# Patient Record
Sex: Female | Born: 1935 | Race: White | Hispanic: No | Marital: Married | Smoking: Never smoker
Health system: Southern US, Community
[De-identification: ages and names within clinical notes are randomized; demographics above are authoritative.]

## PROBLEM LIST (undated history)

## (undated) DIAGNOSIS — H3530 Unspecified macular degeneration: Secondary | ICD-10-CM

## (undated) DIAGNOSIS — Z9289 Personal history of other medical treatment: Secondary | ICD-10-CM

## (undated) DIAGNOSIS — R011 Cardiac murmur, unspecified: Secondary | ICD-10-CM

## (undated) DIAGNOSIS — E785 Hyperlipidemia, unspecified: Secondary | ICD-10-CM

## (undated) DIAGNOSIS — E119 Type 2 diabetes mellitus without complications: Secondary | ICD-10-CM

## (undated) DIAGNOSIS — I499 Cardiac arrhythmia, unspecified: Secondary | ICD-10-CM

## (undated) DIAGNOSIS — M1990 Unspecified osteoarthritis, unspecified site: Secondary | ICD-10-CM

## (undated) DIAGNOSIS — I209 Angina pectoris, unspecified: Secondary | ICD-10-CM

## (undated) DIAGNOSIS — K922 Gastrointestinal hemorrhage, unspecified: Secondary | ICD-10-CM

## (undated) DIAGNOSIS — I739 Peripheral vascular disease, unspecified: Secondary | ICD-10-CM

## (undated) DIAGNOSIS — I779 Disorder of arteries and arterioles, unspecified: Secondary | ICD-10-CM

## (undated) DIAGNOSIS — D649 Anemia, unspecified: Secondary | ICD-10-CM

## (undated) DIAGNOSIS — K5520 Angiodysplasia of colon without hemorrhage: Secondary | ICD-10-CM

## (undated) DIAGNOSIS — I252 Old myocardial infarction: Secondary | ICD-10-CM

## (undated) DIAGNOSIS — I2510 Atherosclerotic heart disease of native coronary artery without angina pectoris: Secondary | ICD-10-CM

## (undated) DIAGNOSIS — N183 Chronic kidney disease, stage 3 unspecified: Secondary | ICD-10-CM

## (undated) DIAGNOSIS — I509 Heart failure, unspecified: Secondary | ICD-10-CM

## (undated) DIAGNOSIS — Z8739 Personal history of other diseases of the musculoskeletal system and connective tissue: Secondary | ICD-10-CM

## (undated) DIAGNOSIS — I10 Essential (primary) hypertension: Secondary | ICD-10-CM

## (undated) HISTORY — PX: CORONARY ANGIOPLASTY WITH STENT PLACEMENT: SHX49

## (undated) HISTORY — DX: Atherosclerotic heart disease of native coronary artery without angina pectoris: I25.10

## (undated) HISTORY — DX: Unspecified macular degeneration: H35.30

## (undated) HISTORY — DX: Angiodysplasia of colon without hemorrhage: K55.20

## (undated) HISTORY — PX: CARDIAC CATHETERIZATION: SHX172

## (undated) HISTORY — DX: Disorder of arteries and arterioles, unspecified: I77.9

## (undated) HISTORY — DX: Peripheral vascular disease, unspecified: I73.9

## (undated) HISTORY — DX: Unspecified osteoarthritis, unspecified site: M19.90

---
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
## 1959-05-24 HISTORY — PX: DILATION AND CURETTAGE OF UTERUS: SHX78

## 1988-09-22 HISTORY — PX: CORONARY ARTERY BYPASS GRAFT: SHX141

## 1991-09-23 HISTORY — PX: AORTIC VALVE REPLACEMENT: SHX41

## 1992-09-22 HISTORY — PX: CARDIAC VALVE REPLACEMENT: SHX585

## 2001-06-17 ENCOUNTER — Encounter: Payer: Self-pay | Admitting: Family Medicine

## 2001-06-17 ENCOUNTER — Encounter: Admission: RE | Admit: 2001-06-17 | Discharge: 2001-06-17 | Payer: Self-pay | Admitting: Family Medicine

## 2001-07-23 ENCOUNTER — Encounter: Admission: RE | Admit: 2001-07-23 | Discharge: 2001-07-23 | Payer: Self-pay | Admitting: Family Medicine

## 2001-07-23 ENCOUNTER — Encounter: Payer: Self-pay | Admitting: Family Medicine

## 2001-09-29 ENCOUNTER — Other Ambulatory Visit: Admission: RE | Admit: 2001-09-29 | Discharge: 2001-09-29 | Payer: Self-pay | Admitting: Family Medicine

## 2001-10-01 ENCOUNTER — Encounter: Admission: RE | Admit: 2001-10-01 | Discharge: 2001-10-01 | Payer: Self-pay | Admitting: Family Medicine

## 2001-10-01 ENCOUNTER — Encounter: Payer: Self-pay | Admitting: Family Medicine

## 2002-10-05 ENCOUNTER — Encounter: Payer: Self-pay | Admitting: Family Medicine

## 2002-10-05 ENCOUNTER — Encounter: Admission: RE | Admit: 2002-10-05 | Discharge: 2002-10-05 | Payer: Self-pay | Admitting: Family Medicine

## 2003-01-15 ENCOUNTER — Encounter: Payer: Self-pay | Admitting: Family Medicine

## 2003-01-15 ENCOUNTER — Encounter: Admission: RE | Admit: 2003-01-15 | Discharge: 2003-01-15 | Payer: Self-pay | Admitting: Family Medicine

## 2003-02-15 ENCOUNTER — Encounter: Admission: RE | Admit: 2003-02-15 | Discharge: 2003-02-24 | Payer: Self-pay | Admitting: Orthopedic Surgery

## 2003-10-29 ENCOUNTER — Inpatient Hospital Stay (HOSPITAL_COMMUNITY): Admission: EM | Admit: 2003-10-29 | Discharge: 2003-11-04 | Payer: Self-pay | Admitting: Emergency Medicine

## 2003-12-01 ENCOUNTER — Encounter: Admission: RE | Admit: 2003-12-01 | Discharge: 2003-12-01 | Payer: Self-pay | Admitting: Family Medicine

## 2003-12-10 ENCOUNTER — Inpatient Hospital Stay (HOSPITAL_COMMUNITY): Admission: EM | Admit: 2003-12-10 | Discharge: 2003-12-13 | Payer: Self-pay | Admitting: Emergency Medicine

## 2004-02-23 ENCOUNTER — Encounter (HOSPITAL_COMMUNITY): Admission: RE | Admit: 2004-02-23 | Discharge: 2004-05-23 | Payer: Self-pay | Admitting: Family Medicine

## 2004-09-27 ENCOUNTER — Other Ambulatory Visit: Admission: RE | Admit: 2004-09-27 | Discharge: 2004-09-27 | Payer: Self-pay | Admitting: Family Medicine

## 2004-10-16 ENCOUNTER — Ambulatory Visit: Payer: Self-pay | Admitting: Hematology & Oncology

## 2004-12-16 ENCOUNTER — Encounter: Admission: RE | Admit: 2004-12-16 | Discharge: 2004-12-16 | Payer: Self-pay | Admitting: Family Medicine

## 2005-02-11 ENCOUNTER — Ambulatory Visit: Payer: Self-pay | Admitting: Hematology & Oncology

## 2005-08-19 ENCOUNTER — Ambulatory Visit: Payer: Self-pay | Admitting: Internal Medicine

## 2005-08-22 ENCOUNTER — Ambulatory Visit: Payer: Self-pay | Admitting: Internal Medicine

## 2005-08-22 ENCOUNTER — Ambulatory Visit (HOSPITAL_COMMUNITY): Admission: RE | Admit: 2005-08-22 | Discharge: 2005-08-22 | Payer: Self-pay | Admitting: Internal Medicine

## 2005-11-11 ENCOUNTER — Ambulatory Visit: Payer: Self-pay | Admitting: Internal Medicine

## 2005-12-17 ENCOUNTER — Encounter: Admission: RE | Admit: 2005-12-17 | Discharge: 2005-12-17 | Payer: Self-pay | Admitting: Family Medicine

## 2006-12-28 ENCOUNTER — Encounter: Admission: RE | Admit: 2006-12-28 | Discharge: 2006-12-28 | Payer: Self-pay | Admitting: Family Medicine

## 2007-11-29 ENCOUNTER — Other Ambulatory Visit: Admission: RE | Admit: 2007-11-29 | Discharge: 2007-11-29 | Payer: Self-pay | Admitting: Family Medicine

## 2008-01-03 ENCOUNTER — Encounter: Admission: RE | Admit: 2008-01-03 | Discharge: 2008-01-03 | Payer: Self-pay | Admitting: Family Medicine

## 2008-02-21 ENCOUNTER — Inpatient Hospital Stay (HOSPITAL_COMMUNITY): Admission: RE | Admit: 2008-02-21 | Discharge: 2008-02-22 | Payer: Self-pay | Admitting: Interventional Cardiology

## 2008-02-24 ENCOUNTER — Inpatient Hospital Stay (HOSPITAL_COMMUNITY): Admission: EM | Admit: 2008-02-24 | Discharge: 2008-03-06 | Payer: Self-pay | Admitting: Emergency Medicine

## 2008-02-24 ENCOUNTER — Ambulatory Visit: Payer: Self-pay | Admitting: Vascular Surgery

## 2008-02-24 HISTORY — PX: EXPLORATORY LAPAROTOMY: SUR591

## 2008-02-28 ENCOUNTER — Encounter (INDEPENDENT_AMBULATORY_CARE_PROVIDER_SITE_OTHER): Payer: Self-pay | Admitting: Cardiology

## 2008-03-21 ENCOUNTER — Ambulatory Visit: Payer: Self-pay | Admitting: Vascular Surgery

## 2008-03-23 ENCOUNTER — Encounter (HOSPITAL_COMMUNITY): Admission: RE | Admit: 2008-03-23 | Discharge: 2008-06-15 | Payer: Self-pay | Admitting: Interventional Cardiology

## 2008-04-20 ENCOUNTER — Ambulatory Visit: Payer: Self-pay | Admitting: Cardiology

## 2008-04-21 ENCOUNTER — Inpatient Hospital Stay (HOSPITAL_COMMUNITY): Admission: EM | Admit: 2008-04-21 | Discharge: 2008-04-30 | Payer: Self-pay | Admitting: Emergency Medicine

## 2008-04-26 ENCOUNTER — Ambulatory Visit: Payer: Self-pay | Admitting: Internal Medicine

## 2008-05-08 ENCOUNTER — Encounter: Payer: Self-pay | Admitting: Internal Medicine

## 2008-08-22 ENCOUNTER — Ambulatory Visit: Payer: Self-pay | Admitting: Vascular Surgery

## 2009-01-03 ENCOUNTER — Encounter: Admission: RE | Admit: 2009-01-03 | Discharge: 2009-01-03 | Payer: Self-pay | Admitting: Family Medicine

## 2009-12-11 ENCOUNTER — Inpatient Hospital Stay (HOSPITAL_COMMUNITY): Admission: RE | Admit: 2009-12-11 | Discharge: 2009-12-12 | Payer: Self-pay | Admitting: Interventional Cardiology

## 2010-01-10 ENCOUNTER — Encounter: Admission: RE | Admit: 2010-01-10 | Discharge: 2010-01-10 | Payer: Self-pay | Admitting: Family Medicine

## 2010-12-15 LAB — GLUCOSE, CAPILLARY
Glucose-Capillary: 117 mg/dL — ABNORMAL HIGH (ref 70–99)
Glucose-Capillary: 128 mg/dL — ABNORMAL HIGH (ref 70–99)
Glucose-Capillary: 161 mg/dL — ABNORMAL HIGH (ref 70–99)

## 2010-12-15 LAB — CBC
HCT: 34 % — ABNORMAL LOW (ref 36.0–46.0)
Platelets: 210 10*3/uL (ref 150–400)
RDW: 13.5 % (ref 11.5–15.5)

## 2010-12-15 LAB — BASIC METABOLIC PANEL
BUN: 12 mg/dL (ref 6–23)
GFR calc non Af Amer: 52 mL/min — ABNORMAL LOW (ref >60)
Glucose, Bld: 135 mg/dL — ABNORMAL HIGH (ref 70–99)
Potassium: 4.7 mEq/L (ref 3.5–5.1)

## 2010-12-15 LAB — PROTIME-INR: INR: 1.52 — ABNORMAL HIGH (ref 0.00–1.49)

## 2010-12-25 ENCOUNTER — Other Ambulatory Visit: Payer: Self-pay | Admitting: Family Medicine

## 2010-12-25 DIAGNOSIS — Z1231 Encounter for screening mammogram for malignant neoplasm of breast: Secondary | ICD-10-CM

## 2011-01-13 ENCOUNTER — Ambulatory Visit
Admission: RE | Admit: 2011-01-13 | Discharge: 2011-01-13 | Disposition: A | Discharge status: Home / Self Care | Payer: Medicare Other | Source: Ambulatory Visit | Attending: Family Medicine | Admitting: Family Medicine

## 2011-01-13 DIAGNOSIS — Z1231 Encounter for screening mammogram for malignant neoplasm of breast: Secondary | ICD-10-CM

## 2011-02-04 NOTE — Discharge Summary (Signed)
[name], [name]                ACCOUNT NO.:  [id number]   MEDICAL RECORD NO.:  [id number]          PATIENT TYPE:  INP   LOCATION:  2011                         FACILITY:  [facility]   PHYSICIAN:  Judeth Cornfield. Scot Dock, M.D.DATE OF BIRTH:  September 30, 1932   DATE OF ADMISSION:  02/24/2008  DATE OF DISCHARGE:  03/06/2008                               DISCHARGE SUMMARY   DISCHARGE DIAGNOSES:  1. Retroperitoneal bleed.  2. Bleeding from external iliac artery.  3. Coronary artery disease status post cardiac catheterization, Monday      prior to retroperitoneal hematoma.  4. History of gastrointestinal bleed.  5. Acute blood loss anemia.  6. Diabetes mellitus.  7. Status post aortic valve replacement using St. Jude valve.  8. History of congestive heart failure.   PROCEDURES PERFORMED:  1. Exploratory laparotomy.  2. Exploration of right groin with identification and control of right      iliac hemorrhage by Dr. Scot Dock in February 24, 2008.   COMPLICATIONS:  None.   DISCHARGE MEDICATIONS:  She is instructed to resume her home medicines  consisting of  1. Digoxin 0.125 mg p.o. daily.  2. Lasix 80 mg p.o. daily.  3. Vytorin  10/80 mg p.o. daily.  4. Aspirin 81 mg p.o. daily.  5. Diovan 80 mg p.o. daily.  6. Folic acid XX123456 mcg p.o. b.i.d.  7. Tandem Plus p.o. b.i.d.  8. Coumadin 5 mg p.o. daily.  9. Metoprolol tartrate 50 mg p.o. b.i.d.  10.Metformin, hydrochlorothiazide 500 mg p.o. b.i.d.  11.Klor-Con 20 mEq p.o. b.i.d.  12.Isosorbide mononitrate 60 mg p.o. daily.  13.Sublingual nitroglycerin 0.4 mg spray as needed.  14.Plavix 75 mg p.o. daily   DISPOSITION:  She is being discharged home in stable condition with her  wounds healing well.  She is instructed to clean the wound with soap and  warm water.  She may increase her activity slowly.  She may walk up  steps.  She may shower.  She should not lift for 6 more weeks.  He  should not drive for 3 weeks.  She is to see Dr. Scot Dock in 2  weeks in  the office will call with a visit.  She is to follow up with Dr.  Hassell Done office for Coumadin adjustment on Wednesday.  She is to see  Dr. Irish Lack at his scheduled appointments.   BRIEF IDENTIFYING STATEMENT:  For complete details, please refer the  typed history and physical.  Briefly, this very pleasant 77 year old  woman presented to the emergency room with an acute abdomen.  She had a  recent cardiac catheterization, it was suspected that she had a  retroperitoneal bleed.  She was hemodynamically unstable.  She was  evaluated by Dr. Scot Dock and recommended exploratory laparotomy with  control of any bleeding sites.  She was taken to the operating room on  an emergent basis.   HOSPITAL COURSE:  She was taken to the operating room on an urgent  basis.  She underwent the aforementioned exploratory laparotomy with  release of her retroperitoneal hematoma and exploration of her right  groin with identification and  control of a bleeding site on her right  iliac artery.  She tolerated the procedure and was returned to the  Intensive Care Unit in critical but hemodynamically stable and guarded  condition.  She required several days in the Intensive Care Unit.  She  required multiple blood products for placement.  She was eventually able  to be transferred to a bed on a surgical step-down unit.   Her anticoagulation with Coumadin was started when appropriate.  Postoperatively, she did have some liver dysfunction as was suspected.  She did have an elevated bilirubin to a peak of 11.3.  This was  declining.  Her liver functions were returning to normal limits.   Postoperatively, she was walking and improving with physical therapy.  Her wounds were healing well.  She was desirous of discharge home on  March 06, 2008.  She was felt stable and then subsequently discharged  home.      Chad Cordial, PA      Judeth Cornfield. Scot Dock, M.D.  Electronically Signed     KEL/MEDQ  D:  03/06/2008  T:  03/06/2008  Job:  [id number]   cc:   Mauri Brooklyn

## 2011-02-04 NOTE — H&P (Signed)
[name], [name]                ACCOUNT NO.:  [id number]   MEDICAL RECORD NO.:  [id number]          PATIENT TYPE:  INP   LOCATION:  2550                         FACILITY:  [facility]   PHYSICIAN:  Judeth Cornfield. Scot Dock, M.D.DATE OF BIRTH:  01-Dec-1932   DATE OF ADMISSION:  02/24/2008  DATE OF DISCHARGE:                              HISTORY & PHYSICAL   REASON FOR ADMISSION:  Acute abdominal pain with hypotension.   HISTORY:  This is a 77 year old woman who had been brought to the  emergency room by paramedics.  She had been complaining of severe  abdominal pain at home.  Reportedly, initially a blood pressure could  not be obtained.  Her initial blood pressure in the emergency  department, the best I can tell, was 80 systolic.  She later improved to  127/36 but then subsequent pressure dropped to 65.  She has not in the  emergency department for very long and was evaluated by the room  emergency department physician.  It was felt that she potentially have  ruptured aneurysm or other acute abdominal process and vascular surgery  was consulted.  I have very briefly spoken to the patient before taking  emergently to the operating room.  She tells me that she is 77 and had a  heart cath Monday.  She had been having some mild abdominal pain and  today her pain became acutely worse.  She subsequently became  hypotensive and developed a markedly distended abdomen.  Because of the  emergency of the situation, a full history could not be obtained.  According to her records from 2005, she has a history of diabetes, in  addition to the hypertension, and status post aortic valve replacement  with a St. Jude valve.  She also has a history of recurrent GI bleed in  the past.  She is maintained on Coumadin because her aortic valve  replacement.  She has also had previous coronary bypass grafting surgery  in 1990.  She also has a history of congestive heart failure.   FAMILY HISTORY:  She has a  history of coronary disease in the family.  No history of malignancy in the family.   ALLERGIES:  PENICILLIN.   PAST SURGICAL HISTORY:  Four-vessel CABG and aortic valve replacement.   MEDICATIONS:  Based on the discharge sheet which she had were:  1. Lovenox 60 mg subcu twice daily.  2. Digoxin 0.125 mg p.o. daily.  3. Lasix 80 mg p.o. daily.  4. Vytorin 10/80 mg p.o. daily.  5. Aspirin 81 mg p.o. daily.  6. Diovan 80 mg p.o. daily.  7. Folic acid A999333 mg p.o. b.i.d.  8. Coumadin 2.5 mg Sundays and Thursdays and 5 mg Mondays, Tuesdays,      Wednesdays, Fridays and Saturdays.  9. Metoprolol 50 mg p.o. b.i.d.  10.Metformin 500 mg 2 p.o. b.i.d.  11.K-Lor 20 mEq p.o. b.i.d.  12.Isosorbide 60 mg p.o. daily.   REVIEW OF SYSTEMS:  Could not be obtained.   SOCIAL HISTORY:  Was not obtained.   PHYSICAL EXAMINATION:  The blood pressure when I last saw  her in the  emergency department was 65 systolic before she was taken urgently to  the operating room.   IMPRESSION:  This patient presents with sudden onset abdominal pain with  a distended abdomen and hypotension.  This is most likely related to her  recent heart cath and she did have a large retroperitoneal hematoma with  potentially active bleeding.  She is hemodynamically unstable and needs  to be taken urgently to the operating room.  Family is not yet available  to discuss this and we will proceed urgently.  I did briefly discuss  this with the patient.  She did undergo a heart cath and had PTCA of 2  stenoses.  She is on Plavix and also Coumadin and Lovenox.  Her labs  were pending.  We will proceed urgently.      Judeth Cornfield. Scot Dock, M.D.  Electronically Signed     CSD/MEDQ  D:  02/24/2008  T:  02/24/2008  Job:  [id number]

## 2011-02-04 NOTE — H&P (Signed)
[name], [name]                ACCOUNT NO.:  [id number]   MEDICAL RECORD NO.:  [id number]          PATIENT TYPE:  INP   LOCATION:  [id number]                         FACILITY:  [facility]   PHYSICIAN:  Sheila Oats, M.D.DATE OF BIRTH:  [birth year]   DATE OF ADMISSION:  04/20/2008  DATE OF DISCHARGE:                              HISTORY & PHYSICAL   PRIMARY CARE PHYSICIAN:  Modena Jansky. Marisue Humble, M.D.   CARDIOLOGIST:  Jettie Booze, M.D.   GASTROENTEROLOGIST:  Delfin Edis, M.D.   CHIEF COMPLAINT:  Black stools.   HISTORY OF THE PRESENT ILLNESS:  The patient is a 77 year old white  female with a past medical history significant for recent  retroperitoneal bleed, bleeding from the external iliac following  cardiac catheterization in June with stent placement, coronary artery  disease status post CABG about 17 years ago, prior history of GI bleed  with acute blood loss anemia, status post aortic valve replacement using  St. Jude's valve and is on chronic Coumadin, diabetes mellitus,  hypertension, history of CHF, and history of MI who presents with the  above complaint.  She states that she has been feeling dizzy for the  past 3-4 days; and,  2 days ago she went to see her primary care  physician and hemoglobin was ordered.  Her dizziness persisted and also  she became weaker; and, today she had a melanotic stool and so she  called her primary care physician's office.  At that time she was told  that her hemoglobin came back at 9.0 and she was asked to come to the  ER.  It is noted that her last hemoglobin during her June  hospitalization was 11.  She also admits to left-sided chest discomfort  described as a pressure, 2-3/10 in intensity, which is worse with  exertion, and she has also had dyspnea on exertion.  She reports that  she has been nauseated since her surgery in June for the retroperitoneal  bleed and that has not changed.  She denies cough, fevers, diarrhea and  vomiting.   She denies dysuria but, states that she was recently  diagnosed with a UTI, and treated with Bactrim by her primary care  physician.   The patient was seen in the ER and lab work revealed a hemoglobin of 7.7  with an INR of 6.0 and a troponin of 0.17 with a CK/MB of 1.2, and  myoglobin of 62.5.  [facility] was consulted per the ED  physician and he states that the abnormal troponin is  most likely  secondary to demand ischemia due to the severe anemia.  He also  recommended that the INR be reversed and he will see the patient.  Gastroenterology was also consulted in (Dr. Fuller Plan) and he agreed with  transfusing the patient, reversing the Coumadin and he will see the  patient, but will not perform an endoscopy tonight.  She is admitted for  further evaluation and management.   PAST MEDICAL HISTORY:  The past medical history is as noted above.   MEDICATIONS:  1. Aspirin 81 mg daily.  2.  Coumadin 5 mg.  3. Digoxin 0.125 mg.  4. Diovan 80 mg daily.  5. Folic acid A999333 mcg daily.  6. Isosorbide dinitrate 60 mg daily.  7. Lasix 80 mg daily.  8. Metformin 500 mg twice a day.  9. Metoprolol 50 mg twice a day.  10.Kay-Ciel 20 mEq.  11.Vytorin 10/80 mg daily.  12.Bactrim.   ALLERGIES:  PENICILLIN.   SOCIAL HISTORY:  The patient denies tobacco.  She also denies alcohol.   FAMILY HISTORY:  The family history is reviewed and is noncontributory  to current illness.   PHYSICAL EXAMINATION:  GENERAL APPEARANCE:  In general the patient is an  elderly white female.  She is alert and in no respiratory distress.  VITAL SIGNS:  Temperature is 98.2 with a blood pressure of 117/48;  however, initially it was 109/61.  Pulse is 86, respiratory rate is  16  and O2 sat is 99%.  HEENT:  PERRL.  EOMI.  Slightly dry mucous membranes and no oral  exudates.  NECK:  The neck is supple.  No adenopathy, no thyromegaly and no JVD.  HEART:  Cardiovascular exam shows regular rate and rhythm.  Normal  S1  and S2.  She has a 2/6 systolic murmur and no S3 is appreciated.  LUNGS:  The lungs are clear to auscultation bilaterally.  No crackles or  wheezes.  ABDOMEN:  The abdomen is soft.  Bowel sounds are present.  There is mild  lower abdominal tenderness to palpation, no rebound, no organomegaly and  no masses are palpable.  EXTREMITIES:  The extremities reveal no cyanosis and no edema.  NEUROLOGIC:  On neuro exam she is alert and oriented x3.  Cranial nerves  II-XII are grossly intact.  Nonfocal exam.   LABORATORY DATA:  The laboratory data is as per the HPI, also occult  blood is positive.  Sodium is 131, potassium is 3.7, chloride is 98, BUN  is 25, creatinine is 1.5 with a glucose of 97.  Ionized calcium is 107.  White cell count is 8.4 with a hemoglobin of 7.7, hematocrit of 22.6,  platelet count of 297,000, and neutrophil count 57%.  INR is 6.0.  Urinalysis is unremarkable.  EKG showed normal sinus rhythm with a rate  of 90, and ST depressions in 1, 2 and the lateral leads.   ASSESSMENT AND PLAN:  1. Melena with gastrointestinal bleed as discussed above in a patient      with a supertherapeutic INR and with a St. Jude's valve as well as      aortic valve replacement.  Gastrointestinal was consulted.  We will      monitor the hemoglobin and hematocrit.  We will transfuse packed      red blood cells, and hold Coumadin and aspirin.  The patient was      given vitamin K, fresh frozen plasma and platelets in the emergency      room; and, as already indicated we will be monitoring the      hemoglobin and hematocrit, perform fluid resuscitation and keep the      patient nothing per os.  2. Severe anemia secondary to #1 as above.  3. Supertherapeutic INR.  As above she has received fresh frozen      plasma and vitamin K to reverse this; and, we will follow.  4. Abnormal cardiac enzymes with coronary artery disease.  As      discussed above the patient is status post coronary artery  bypass  graft in the past and a stent recently placed in June 2009.  She is      complaining of chest pain and electrocardiogram showed ST      depression in 1, 2 and the lateral leads.  As needed nitroglycerin.      No aspirin or anticoagulants at this time secondary to the      gastrointestinal bleed.  We will give her low dose beta blocker as      blood pressure tolerates.  Cardiology was consulted as already      discussed.  5. Status post aortic valve replacement.  As above we are reversing      Coumadin.  We will monitor and cardiology has been consulted.  6. History of congestive heart failure.  Monitor fluid status and      further management will be as appropriate.  7. Diabetes mellitus.  We will hold the metformin, monitor Accu-Cheks      and cover with sliding scale.  Please go back up to number one and      add as the last sentence there      Sheila Oats, M.D.  Electronically Signed     ACV/MEDQ  D:  04/21/2008  T:  04/21/2008  Job:  [id number]   cc:   Modena Jansky. Marisue Humble, M.D.  Jettie Booze, MD  Lowella Bandy. Olevia Perches, MD

## 2011-02-04 NOTE — Consult Note (Signed)
[name], [name]                ACCOUNT NO.:  [id number]   MEDICAL RECORD NO.:  [id number]           PATIENT TYPE:   LOCATION:                                 FACILITY:   PHYSICIAN:  Lear Ng, MDDATE OF BIRTH:  [birth year]   DATE OF CONSULTATION:  DATE OF DISCHARGE:                                 CONSULTATION   We were asked to see [name] today in consultation for elevated  bilirubin and jaundice by Dr. Grover Canavan.   HISTORY OF PRESENT ILLNESS:  This is a 77 year old female with history  of severe coronary artery disease who had a cardiac catheterization on  June 1.  She developed abdominal pain and hypotension, went to the ER on  June 3.  She had exploratory laparoscopy, which revealed a  retroperitoneal hematoma and bleeding from the right external iliac  artery.  Her INR on admission was 4.4.   Her lab test showed that her bilirubin was 1.0 on June 5, 9.0 on June 8.  Her Coumadin was restarted on June 5 and discontinued on either June 8  or 9.  Her bilirubin today is 11.3, Coumadin been held for the last  couple of days.  Per chart, she has received a total of 9 units packed  red blood cells and 4 units of FFP.  Mostly on June 4, she did receive 2  units of packed red blood yesterday.   The patient had GI bleeding problems in 2005.  She had a colonoscopy and  an endoscopy by Dr. Teena Irani, both were unrevealing for source of  bleeding.  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  She then went to a hematologist to rule out things like  leukemia.  The hematologist prescribed iron and she has had no bleeding  problems since.  She has no history of liver disease, elevated  bilirubin, and alcohol abuse, hepatitis, or hepatitis risk factors.  Prior to this admit the patient's INR has been very stable.   PAST MEDICAL HISTORY:  Significant for aortic valve replacement,  hyperlipidemia, diabetes, cardiac arrhythmias, coronary artery disease,  anemia, hematochezia in February 2005.  She had a St. Jude aortic valve  replacement in 1994, coronary artery bypass grafting in 1996.  She has  had laparoscopy and appendectomy.   CURRENT MEDICATIONS:  Include, Coumadin, Digoxin, 81 mg aspirin, Plavix  and the rest are per chart.   ALLERGY:  She has an allergy to PENICILLIN.   SOCIAL HISTORY:  Negative for drugs, alcohol, and tobacco.  She is  married, her husband is present in the room.   FAMILY HISTORY:  Significant for no liver disease.   PHYSICAL EXAM:  GENERAL:  She is alert and oriented in no apparent  distress.  She is 2+ jaundice.  She also has icterus.  HEART:  Has regular rate and rhythm.  Urine in the Foley catheter is very red.  LUNGS:  She is taking shallow breaths secondary to pain, but there are  no wheezes or crackles demonstrated.  ABDOMEN:  Her incision is clean  and dry with no signs of infection.  Her  abdomen is firm, distended.  She says it is less distended than  yesterday.  She has a few tympanic bowel sounds.  She is tender in left  lower quadrant.   CURRENT LABS:  Show hemoglobin of 10.4; that is after 2 units of packed  red blood cells yesterday when her hemoglobin was 8.4, hematocrit 29.6,  white count 8.4, platelets 296,000.  BMET is within normal limits.  Her  BUN has slowly climbed, it is 21, which is still within normal limits.  Creatinine 0.78, glucose 128.  Her INR on June 8 was 2.0.  Her  hemoglobin was 10.3.  Her INR on June 10 was 4.3.  Her hemoglobin was  8.4.  Today her INR is 3.8.   ASSESSMENT:  Dr. Wilford Corner seen and examined the patient,  collected a history and reviewed her chart.   IMPRESSION:  His impression is as follows; the patient seen and  examined.  Hospital events reviewed.  I think jaundice is related to  extravasation of blood and the tissues causing asymptomatic  hyperbilirubinemia.  Total bilirubin has a delayed affect, I think it  will plateau out soon.  No need  to order other hepatic studies unless  elevated bilirubin persists without sign of further bleeding and will  follow along with you.  Thank you very much for this consultation.      Melton Alar, [place]      Lear Ng, MD  Electronically Signed    MLY/MEDQ  D:  03/02/2008  T:  03/03/2008  Job:  [id number]   cc:   Lear Ng, MD  Jettie Booze, MD

## 2011-02-04 NOTE — Assessment & Plan Note (Signed)
OFFICE VISIT   [name], [name]  DOB:  1933/07/26                                       03/21/2008  CHART#:[id number]   I saw the patient in the office today for continued followup after  repair of the right external iliac artery.  This 77 year old woman who  developed acute abdominal pain and presented to the emergency department  with hypotension and abdominal distention.  She was taken urgently to  the operating room and  underwent exploratory laparotomy for control of  bleeding and repair of the right external iliac artery.  She comes in  for a routine followup visit.  Her only complaint has been that her  appetite has not to fully returned.  She has been gradually resuming her  normal activities.   REVIEW OF SYSTEMS:  She has had no recent chest pain, chest pressure, or  significant shortness of breath.   PHYSICAL EXAMINATION:  Blood pressure is 115/68, heart rate is 89.  Her  abdominal incision has healed nicely, as has her groin incision.  She  has normal-pitched bowel sounds.  Abdomen is soft and nontender.  She  has warm, well-perfused feet with biphasic Doppler signals in both feet.  ABI of 100% bilaterally.   Overall I am pleased with her progress.  I have encouraged her to  gradually resume her normal activities.  I plan on seeing her back in 6  months.  She knows to call sooner if she has problems.   Judeth Cornfield. Scot Dock, M.D.  Electronically Signed   CSD/MEDQ  D:  03/21/2008  T:  03/22/2008  Job:  [id number]

## 2011-02-04 NOTE — Assessment & Plan Note (Signed)
OFFICE VISIT   SAVANNAHA, GRAM  DOB:  [birth year]                                       08/22/2008  CHART#:[id number]   I saw the patient in the office today for followup after she underwent  exploratory laparotomy for retroperitoneal hematoma back in June of this  year.  When I saw her last, she had some problems with her appetite and  comes in for routine followup visit.  Her appetite has returned to  normal.  She had no significant abdominal or back pain.  She had no  fever or chills.  She had no claudication or rest pain.   REVIEW OF SYSTEMS:  She had no chest pain, chest pressure, palpitations  or arrhythmias.  She had no bronchitis, asthma or wheezing.   PHYSICAL EXAMINATION:  This is a pleasant 77 year old woman who appears  her stated age.  Blood pressure 104/58, the heart rate is 71,  temperature 97.8.  Lungs are clear bilaterally to auscultation.  Cardiac  exam, she has a regular rate and rhythm.  Abdomen:  Soft, nontender.  Incision has healed nicely.  She has no evidence of ventral hernia.  She  has warm well-perfused feet and palpable femoral pulses.  Right groin  incision has healed nicely.   Overall I am pleased with her progress and I will see her back p.r.n.   Judeth Cornfield. Scot Dock, M.D.  Electronically Signed   CSD/MEDQ  D:  08/22/2008  T:  08/23/2008  Job:  [id number]

## 2011-02-04 NOTE — Op Note (Signed)
[name], [name] NO.:  [id number]   MEDICAL RECORD NO.:  [id number]          PATIENT TYPE:  INP   LOCATION:  2303                         FACILITY:  [facility]   PHYSICIAN:  Judeth Cornfield. Scot Dock, M.D.DATE OF BIRTH:  1933/01/18   DATE OF PROCEDURE:  02/24/2008  DATE OF DISCHARGE:                               OPERATIVE REPORT   PREOPERATIVE DIAGNOSIS:  Acute abdominal pain with hypotension.   POSTOPERATIVE DIAGNOSES:  Retroperitoneal hematoma and bleeding from  right external iliac artery.   SURGEON:  Judeth Cornfield. Scot Dock, MD   ANESTHESIA:  General.   INDICATIONS:  This is a 66-year woman who developed acute abdominal pain  and presented to the emergency department with hypotension and abdominal  distention.  She was taken urgently to the operating room for  exploratory laparotomy, control of bleeding, presumably from her  previous cath.   TECHNIQUE:  The patient was taken to the operating room urgently and  Swan-Ganz catheter and arterial line were placed by Anesthesia.  The  abdomen and groins were prepped and draped in usual sterile fashion.  The abdomen was entered through a midline incision, and there was a  large pelvic retroperitoneal hematoma noted.  I felt the patient did not  have evidence of an abdominal aortic aneurysm or leaking.  I did expose  the infrarenal aorta for proximal control.  Next, I made an oblique  incision in the right groin where the superficial femoral, deep femoral,  and common femoral arteries were exposed, and I dissected up higher on  the external iliac artery where there was active bleeding from the  external iliac artery.  This was controlled with digital pressure, so I  could dissect above and below the injury.  The artery was briefly  clamped above and below, and dissected out the injury, and this was  repaired with two 5-0 Prolene sutures.  Hemostasis was obtained here and  the clamps were released, and attention  turned to the abdomen again.  Now, I had proximal and distal control.  I entered the large pelvic  hematoma, and a large amount of clot was evacuated.  There was no active  bleeding currently.  The abdomen was irrigated with copious amounts of  saline.  I did an exploratory laparotomy, did not see any evidence of  aneurysm or significant GI pathology.  The abdomen was irrigated,  hemostasis obtained.  The abdominal contents were then returned to their  normal position, and the fascial layer was closed with two #1 PDS  sutures.  The subcutaneous layer was closed with 3-0 Vicryl.  The skin  was closed with staples.  The groin incision was closed after hemostasis  was obtained using a deep layer of 2-0 Vicryl, subcutaneous layer of 2-0  Vicryl, and the skin was closed with a 4-0 subcuticular stitch.  Sterile  dressing was applied.  The patient tolerated the procedure well, was  transferred to the recovery room in satisfactory condition.  All needle  and sponge counts were correct.      Judeth Cornfield. Scot Dock, M.D.  Electronically Signed  CSD/MEDQ  D:  02/24/2008  T:  02/25/2008  Job:  [id number]

## 2011-02-04 NOTE — Discharge Summary (Signed)
[name], [name]                ACCOUNT NO.:  [id number]   MEDICAL RECORD NO.:  [id number]          PATIENT TYPE:  INP   LOCATION:  2002                         FACILITY:  [facility]   PHYSICIAN:  Sheila Oats, M.D.DATE OF BIRTH:  10/20/1932   DATE OF ADMISSION:  04/20/2008  DATE OF DISCHARGE:  04/30/2008                               DISCHARGE SUMMARY   DISCHARGE DIAGNOSES:  1. Gastrointestinal bleeding - in patient with supra-therapeutic INR,      likely secondary to small bowel arteriovenous malformations of the      gastroenterology.  2. Acute blood loss anemia - secondary to number 1, hemoglobin 12.2      today prior to discharge (improved from 7.7 on admission).  3. Abnormal cardiac enzymes/EKG abnormality - secondary to demand      ischemia due to severe anemia/gastrointestinal bleed.  4. Supra-therapeutic anemia.  5. Coronary artery disease status post recent PCI with bare metal      stent 2 months ago.  6. History of post cardiac catheterization retroperitoneal bleed and      status post laparotomy with suturing of external iliac.  7. Status post aortic valve replacement/St. Jude's valve, on chronic      Coumadin therapy - Coumadin resumed an INR today 2, goal range      recommended per cardiology 2 to 3.  8. Diabetes mellitus.  9. Hypertension.  10.hyperlipidemia.  11.Elevated LFTs - improved.  12.HIDA scan negative for acute cholecystitis or cystic duct      obstruction.  13.Acute renal insufficiency - resolved.  Last creatinine prior to      discharge 1.   PROCEDURES AND STUDIES:  1. Abdominal ultrasound - gall stones, there is a stone which appears      to be lodged in the neck of the gallbladder, negative for acute      cholecystitis, no biliary ductal dilatation.  2. HIDA scan - no evidence of acute cholecystitis was cystic duct      obstruction.   CONSULTATION:  1. Cardiology - Dr. Irish Lack.  2. [facility], Dr. Olevia Perches.   BRIEF HISTORY:   The patient is a pleasant 77 year old white female with  the above-listed medical problems who presented with melanotic stools.  She reported that she had been feeling dizzy for a few days prior to  admission and had seen her primary care physician and a hemoglobin was  ordered.  It came back at 9.  The patient's dizziness was persisting and  also she was becoming weaker, and so she came to the ER.  In the ER, her  hemoglobin was found to be 7.7 and her INR 6.  She was complaining of  chest pain as well and her EKG revealed inferolateral ST depression and  sinus tachycardia, her cardiac enzymes were also elevated.  Cardiology  and gastroenterology were consulted per ER physician and the patient was  admitted to the medicine service for further evaluation and management.   Please see the full admission history and physical for the details of  the admission physical exam as well as the laboratory  data.   HOSPITAL COURSE:  1. GI bleed/acute blood loss anemia - in the setting of supra-      therapeutic INR.  The patient received fresh frozen plasma as well      as vitamin K in the ER to reverse the Coumadin.  She was also      transfused a unit of platelets in the ER and packed red blood cells      - a total of 4 units of packed red blood cells during her      hospitalization.  Gastroenterology was consulted and [facility]/Dr.      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  They stated that the AVMs      were beyond reach of an endoscope and recommended iron as the      patient had been successfully managed as an outpatient on iron.      Following transfusions, the patient's H&H has remained stable, her      last hemoglobin today prior to discharge is 12.2.  She has had no      evidence of  further GI bleeding and she will be discharged home on      oral iron, and she is to follow up with Dr. Olevia Perches as scheduled.  2. Abnormal cardiac enzymes/EKG abnormality - as discussed above, the      patient was complaining of chest discomfort in the ER.  Cardiac      enzymes as well as EKG were done.  The EKG revealed ST depression      inferolaterally with sinus tachycardia and cardiac enzymes were      elevated.  She was given nitroglycerin, maintained on her Imdur,      and transfused packed red blood cells as above.  Cardiology saw the      patient and indicated that they these changes were secondary to      demand ischemia due to the severe anemia/GI bleed.  Dr. Irish Lack      saw the patient and indicated that no further workup was needed for      ischemia since she had had a recent cardiac catheterization/stent      and was doing well until the onset of the GI bleeding.  With the      above interventions, the patient's chest pain resolved.  She was      maintained on her beta blockers and nitrates during her hospital      stay.  She is to follow up with Dr. Irish Lack as directed upon      discharge.  3. Acute renal insufficiency - resolved, secondary to above,      creatinine was 1.5 on admission and with fluid      resuscitation/transfusions, this resolved and at her last      creatinine 1.  4. Supra-therapeutic INR - as discussed above, her INR was 6 on      admission and it was reversed and she was followed by cardiology      and subsequently restarted on IV heparin and Coumadin for her      aortic valve.  Dr. Irish Lack recommended for her INR to be kept due      to in  2 and 3.  Her INR today is 2 and her H&H has been stable with      no further evidence of GI bleed.  She is to follow up with Dr.      [facility] on May 01, 2008.  5. Status post AVR - St. Jude's valve, as discussed above.  Cardiology      followed the patient and Dr. Irish Lack indicated that the  maximum      time of anticoagulation with her prosthetic aortic valve was 7      days.  As already discussed above, her anticoagulation was resumed      and her INR today is 2 and the patient is to continue her Coumadin      as directed 5 mg for 6 days of the week and 2.5 mg 1 day of the      week.  She is to have a PT/INR done by the office in the morning.  6. Acute on chronic diastolic heart failure - following fluid      resuscitation transfusions the patient's B-natriuretic peptide was      checked and it was elevated at 976.  Her Lasix, which had been held      upon admission secondary to the GI bleeding, was resumed and after      diuresing, her B-natriuretic peptide came back down to 330.      Cardiology followed the patient during her hospital stay.  She is      to continue her outpatient medications and follow up with Dr.      Irish Lack.  7. Diabetes mellitus - her Accu-Cheks were monitored and she was      followed with sliding scale insulin during her hospital stay.  She      is to continue her outpatient medications upon discharge.  8. Elevated LFTs - unclear etiology.  Workup in the hospital included      ultrasound which revealed gallstones and a HIDA scan was negative      for acute cholecystitis, also no cystic duct obstruction.  GI      indicated that with the HIDA scan being normal, there was no      further GI workup for recommended for this.  Her LFTs on recheck      had improved and her last alkaline phosphatase was 49, SGOT 27,      SGPT 23, total protein of 5.6, albumin 3 and the total bilirubin      2.7.  She has been asymptomatic.  She is to follow up with Dr.      [facility] as outpatient as needed.   DISCHARGE MEDICATIONS:  1. She is to continue Coumadin 5 mg daily for 6 days of the week and      2.5 mg one day of the week.  2. Iron sulfate 325 mg 2 times the day.  3. Senokot 2 p.o. nightly.  4. She is to also to continue Lasix.  5. Vytorin.  6.  Diovan.  7. Folic acid.  8. Coumadin.  9. Metoprolol.  10.Metformin.  11.KCl.  12.Isosorbide.   She is to hold off the aspirin until she follows up with her  gastroenterologist and cardiologist.  Also, she is to continue Prilosec  daily as previously.   FOLLOWUP CARE:  1. Dr. [facility] May 01, 2008 as directed.  2. Dr. Marisue Humble in 1 to 2 weeks.  Call for appointment.  3. Dr. Delfin Edis in  1 to 2 weeks, call for appointment.      Sheila Oats, M.D.  Electronically Signed     ACV/MEDQ  D:  04/30/2008  T:  04/30/2008  Job:  [id number]   cc:   Modena Jansky. Marisue Humble, M.D.  Jettie Booze, MD  Lowella Bandy. Olevia Perches, MD

## 2011-02-04 NOTE — Consult Note (Signed)
NAMEAVO, VOCI NO.:  [id number]   MEDICAL RECORD NO.:  [id number]          PATIENT TYPE:  INP   LOCATION:  [id number]                         FACILITY:  [facility]   PHYSICIAN:  Karma Lew, MD          DATE OF BIRTH:  [birth year]   DATE OF CONSULTATION:  04/20/2008  DATE OF DISCHARGE:                                 CONSULTATION   REFERRING PHYSICIAN:  Mackie Pai, MD   PRIMARY CARDIOLOGIST:  Jettie Booze, MD   REASON FOR CONSULTATION:  The patient is being seen at the request of  Dr. Alvino Chapel for evaluation of chest discomfort, positive biomarkers,  and EKG changes.   HISTORY OF PRESENTING ILLNESS:  The patient is a 77 year old white  female with history of coronary artery disease, status post PCI 2 months  ago by Dr. Irish Lack.  She is also status post St. Jude mechanical aortic  valve replacement 15 years ago, on chronic Coumadin therapy, who was  seen in the emergency department with a couple of days worth of chest  discomfort, also reports that she has had some increase in frequency of  dark-colored stools and dyspnea.  Reports that since her PCI 2 months  ago, she has had no chest discomfort up until the past couple of days  when she also noticed that she has had melena in her stools.  On  presentation in the ED, she has ST-depressions on her EKG with positive  biomarkers in a setting of hemoglobin of 7.8.   PAST MEDICAL HISTORY:  1. Status post AVR, on chronic Coumadin therapy for 15 years.  2. Coronary artery disease, status post recent PCI with bare-metal      stent 2 months ago, no longer on Plavix.  3. Diabetes.  4. Hypertension.  5. Hyperlipidemia.   SOCIAL HISTORY:  She lives in [place].  No smoke or no drug use.  No  alcohol use currently.   FAMILY HISTORY:  Reviewed noncontributory.   MEDICATIONS:  Aspirin, Coumadin, digoxin, Diovan, folate, isosorbide  dinitrate, metformin, metoprolol, and Vytorin.   ALLERGIES:  No known  drug allergies.   REVIEW OF SYSTEMS:  Negative review of systems except for those dictated  in above HPI.   PHYSICAL EXAMINATION:  VITAL SINGS:  Blood pressure 109/61, heart rate  of 99, and temperature is afebrile.  GENERAL:  A well-developed, well-nourished, white female in no acute  distress.  HEENT:  Moist mucous membranes.  Sclerae anicteric.  Conjunctival pallor  is present.  CARDIOVASCULAR:  Tachycardic.  Regular rate and rhythm with 2/6 systolic  ejection murmur and a good aortic valve click.  CHEST:  Clear to auscultation bilaterally.  No wheezes, rales, or  rhonchi.  ABDOMEN:  Soft, nontender, and nondistended.  Normoactive bowel sounds.  EXTREMITIES:  No peripheral edema.  Pulse 2+ bilaterally.  NEURO: Nonfocal.   Chest x-ray demonstrates no acute process.  EKG, sinus tachycardia with  inferolateral ST-depressions approximately 1 mm.   LABORATORY DATA:  Significant for hemoglobin of 7.8, BUN and creatinine  of 25 and 1.5.  INR  is 6.0.  MB of 1.2 and troponin of 0.17 on point-of-  care.   IMPRESSION:  1. Acute likely upper gastrointestinal bleed.  2. Status post aortic valve replacement, on Coumadin with Coumadin      toxicity.  3. Status post percutaneous coronary intervention with coronary artery      disease.  4. Demand ischemia with evidence on EKG and biomarkers secondary to      acute likely upper gastrointestinal bleed.   RECOMMENDATIONS:  Given her acute bleed, we will reverse her  coagulopathy and transfuse her to get her hemoglobin up to 10 range  given her history of coronary artery disease.  I do not think that she  has an acute plaque rupture as etiology for her symptoms and objective  evidence of a ongoing ischemia and correcting her underlying condition  will likely help with this.  She has a low daily stroke risk with aortic  valve replacement given its high flow state over the next couple of days  and therefore, I think it is safe to reverse her  coagulopathy given her  acute bleed with FFP and vitamin K for now until we can sort out if the  bleed is active, but not currently.  We will follow along with you at  least this point would hold on any urgent invasive risk stratification  for her.      Karma Lew, MD  Electronically Signed     JT/MEDQ  D:  04/20/2008  T:  04/22/2008  Job:  [id number]

## 2011-02-07 NOTE — Op Note (Signed)
NAMEDIEP, COPLAND                          ACCOUNT NO.:  [id number]   MEDICAL RECORD NO.:  [id number]                   PATIENT TYPE:  INP   LOCATION:  2115                                 FACILITY:  [facility]   PHYSICIAN:  John C. Amedeo Plenty, M.D.                 DATE OF BIRTH:  June 11, 1933   DATE OF PROCEDURE:  10/31/2003  DATE OF DISCHARGE:                                 OPERATIVE REPORT   PROCEDURE:  Colonoscopy.   INDICATIONS FOR PROCEDURE:  Acute GI bleeding, presumably from a lower  source with endoscopy prior to  this procedure unrevealing for any obvious  bleeding site.   DESCRIPTION OF PROCEDURE:  The patient was placed in the left lateral  decubitus position and placed on the pulse monitor with continuous low flow  oxygen delivered by nasal cannula. She was sedated with 100 micrograms of IV  fentanyl and 6 mg of IV Versed.   The Olympus video colonoscope was inserted into the rectum and advanced to  the cecum, confirmed by transillumination of McBurney's point and  visualization of the ileocecal valve and appendiceal orifice.  The prep was  excellent.   The cecum, ascending , transverse, descending and sigmoid colon all appeared  normal with no AVMs, masses, polyps, diverticula or other mucosal  abnormalities. There was a small, 5-mm polyp at the rectosigmoid junction  which had no stigma of hemorrhage, and due to the anticoagulation and  uncertainty about  the source of current bleeding, I elected not to  fulgurate or biopsy it. I did not see any diverticula anywhere.   The rectum likewise appeared normal. Retroflexed view of the anus did reveal  somewhat prominent internal hemorrhoids but with no stigma of hemorrhage.   The colonoscope was then withdrawn and the patient was returned to the  recovery room in stable condition. She tolerated the procedure well and  there were no immediate complications.   IMPRESSION:  1. Diminutive rectal polyp.  2. Internal  hemorrhoids, otherwise totally normal study with no blood seen     anywhere including the terminal ileum.   PLAN:  Expectant management for  now. If she rebleeds, depending  on the  circumstances, will consider a PDDG colonoscopy or capsule endoscopy.                                               John C. Amedeo Plenty, M.D.    JCH/MEDQ  D:  10/31/2003  T:  10/31/2003  Job:  [id number]   cc:   Jerelene Redden, MD   Fabio Asa, M.D.  [address]  [place] [address]  Fax: [place] Marisue Humble, M.D.  [address]  [place] [address]  Fax: [phone]

## 2011-02-07 NOTE — Consult Note (Signed)
NAMECOLBI, JARIWALA                          ACCOUNT NO.:  [id number]   MEDICAL RECORD NO.:  [id number]                   PATIENT TYPE:  INP   LOCATION:  [place]                                 FACILITY:  [facility]   PHYSICIAN:  John C. Amedeo Plenty, M.D.                 DATE OF BIRTH:  1933/08/27   DATE OF CONSULTATION:  12/12/2003  DATE OF DISCHARGE:                                   CONSULTATION   HISTORY OF PRESENT ILLNESS:  The patient is a 77 year old white female with  a mechanical aortic valve who presented with painless hematochezia for the  second time in two months. She had a hemoglobin of 10.9 which dropped to 9.9  and she was transfused 2 units of packed red blood cells. She had two  passages of dark blood two days ago and yesterday and has had none since.  Her vital signs have been stable. On her previous presentation, she  underwent a colonoscopy which was completely within normal limits, an EGD  which showed some antral gastritis with positive CLOtest and she was treated  with Prevpac but had allergic reaction to the ampicillin.  Her BUN was  normal on admission but she denied any melena.   PAST MEDICAL HISTORY:  1. Hyperlipidemia.  2. Aortic valve replacement.  3. Hypertension.   PAST SURGICAL HISTORY:  1. Four vessel CABG.  2. Aortic valve replacement.   ALLERGIES:  PENICILLIN.   MEDICATIONS:  Lanoxin, Lasix, Lipitor, Monopril, Coumadin, metformin, K-Dur.   FAMILY HISTORY:  Positive for coronary artery disease, negative for GI  malignancy.   PHYSICAL EXAMINATION:  GENERAL:  Well-developed, well-nourished, white  female in no acute distress. HEART:  Regular rate and rhythm with 3/6  systolic click.  ABDOMEN:  Soft, nondistended, normal active bowel sounds, no  hepatosplenomegaly, mass or guarding.   IMPRESSION:  GI bleeding of obscure origin with recent  esophagogastroduodenoscopy and colonoscopy unrevealing except for some mild  gastritis which is unlikely to be the  source of bleeding.   PLAN:  I would recommend capsule endoscopy.  However, the patient had small  bowel series today and I would not pursue capsule endoscopy for at least two  days due to likely poor visualization. In addition, if her bleeding ceases,  this may decrease the yield and we may prefer to wait and see if she has  another bleed and try to pursue capsule endoscopy after first investigation.                                               John C. Amedeo Plenty, M.D.    JCH/MEDQ  D:  12/12/2003  T:  12/12/2003  Job:  [id number]   cc:   Modena Jansky. Marisue Humble, M.D.  [address] [employer]  [place]  [place] [address]  Fax: [phone]

## 2011-02-07 NOTE — Discharge Summary (Signed)
NAME:  CIENNA, BESSELMAN                          ACCOUNT NO.:  [id number]   MEDICAL RECORD NO.:  [id number]                   PATIENT TYPE:  INP   LOCATION:  [id number]                                 FACILITY:  [facility]   PHYSICIAN:  Corinna L. Conley Canal, MD             DATE OF BIRTH:  1933/01/16   DATE OF ADMISSION:  12/10/2003  DATE OF DISCHARGE:  12/13/2003                                 DISCHARGE SUMMARY   DIAGNOSES:  1. Recurrent gastrointestinal bleed.  2. Coagulopathy secondary to Coumadin.  3. Acute blood loss anemia.  4. Diabetes mellitus.  5. Status post aortic valve replacement with St. Jude valve.  6. Coronary artery disease with history of coronary artery bypass grafting     in 1990.  7. History of congestive heart failure.   DISCHARGE MEDICATIONS:  1. Coumadin has been decreased to 5 mg p.o. daily.  2. Lasix 80 mg daily.  3. Lipitor 80 mg daily.  4. Monopril 20 mg daily.  5. Metformin 500 mg p.o. b.i.d.  6. K-Dur 20 mEq p.o. b.i.d.  7. Sublingual nitroglycerin as needed.   DISCHARGE INSTRUCTIONS:  Followup with  Dr. Teena Irani next week for a CBC.  She is to call his office immediately for any further GI bleeding at which  time he will consider doing capsule study versus repeat endoscopy.  She is  to call Dr. Don Broach office to reschedule her stress Cardiolite.   CONSULTANTS:  Dr. Amedeo Plenty.   PROCEDURES PERFORMED:  None.   PERTINENT LABORATORY DATA:  Digoxin level 0.9, magnesium 1.8, INR on  admission was 3.9 and at discharge it was 1.3.  PTT on admission was 44.  Hemoglobin on admission was 10.9, hematocrit 31.2.  Her hemoglobin dropped  to 8.8 and after transfusion of two units of packed red blood cells remained  stable in the 13 range.  White blood cell count normal, platelet count  normal.   SPECIAL STUDIES AND RADIOLOGY:  EKG showed normal sinus rhythm with  nonspecific changes.  Upper GI series and small bowel follow through  revealed intermittently impaired  secondary to esophageal peristalsis and  mild intermittent tertiary peristaltic activity throughout the esophagus.  No ulcers or mass seen.  No strictures.   HISTORY AND HOSPITAL COURSE:  Ms. Growden is a 77 year old white female with  recent rectal bleeding and supratherapeutic INR on Coumadin who presented  with another episode of rectal bleeding.  She was just discharged from the  hospital one month ago at which time she had upper and lower endoscopy done  by Dr. Amedeo Plenty.  At that time her endoscopy showed internal hemorrhoids, a  dimunitive rectal polyp and antral erosions without any active bleeding.  The patient was discharged last time and placed back on Coumadin.  She  presented again with the rectal bleeding.  She had two episodes prior to  admission and another episode in the hospital.  Her Coumadin  was held and  she started getting fresh frozen plasma but had a reaction to this which  consisted of flushing and itching.  The transfusion was therefore stopped.  Otherwise her vital signs remained stable at that time.  Her hemoglobin was  slightly low on admission and dropped further. She was transfused two units  of packed red blood cells.  Her bleeding stopped and Dr. Amedeo Plenty was  consulted.  A small bowel follow through showed no mass.  The plan is to do  a capsule study as an outpatient if her bleeding recurs.  I have decreased  her Coumadin dose as her INR was on the high side when she came in. The goal  here should be to keep her between 2.5 to 3. She had been scheduled for a  Cardiolite study with Dr. Jaci Standard as an outpatient but this was cancelled as  she had been hospitalized.  Dr. Jaci Standard was notified of the admission as a  courtesy but she was not consulted.  This can be rescheduled.  The patient  has had no chest pain while here.  The chest pain that she had may have been  related to anemia, possibly.  At the time of discharge, she had not had a  bowel movement for several  days and undoubtedly her bleeding has stopped  again.                                                Corinna L. Conley Canal, MD    CLS/MEDQ  D:  12/13/2003  T:  12/14/2003  Job:  [id number]   cc:   Fabio Asa, M.D.  [place] [place]  [place] [address]  Fax: [place]. Amedeo Plenty, M.D.  [id number] [place]  [place] [address]  Fax: [phone]   [place] Marisue Humble, M.D.  [place] [place]  [place] [address]  Fax: [phone]

## 2011-02-07 NOTE — H&P (Signed)
NAME:  [name], [name]                          ACCOUNT NO.:  [id number]   MEDICAL RECORD NO.:  [id number]                   PATIENT TYPE:  INP   LOCATION:  1830                                 FACILITY:  [facility]   PHYSICIAN:  Melissa L. Lovena Le, MD               DATE OF BIRTH:  1933-05-27   DATE OF ADMISSION:  12/10/2003  DATE OF DISCHARGE:                                HISTORY & PHYSICAL   PRIMARY CARE PHYSICIAN:  Modena Jansky. Ehinger, M.D.   CHIEF COMPLAINT:  Blood per rectum x2.   HISTORY OF PRESENT ILLNESS:  The patient is a 77 year old white female with  recent gastrointestinal bleed last month.  She has a past medical history of  aortic valve replacement and is on chronic Coumadin therapy.  She states  that this a.m. she passed two blood stools without abdominal pain associated  with this.  Her first movement was dark blood.  Her second movement was  bright red blood.  She has had no movements since.  She denies fever,  chills, nausea, vomiting.  She recently had endoscopy colonoscopy as related  to the previous admission for GI bleeding during which time no obvious  source for bleeding was located.  She was recently placed on Prev-Pak as per  her GI practitioner.  She, unfortunately, however, is allergic and over the  last three days had to receive prednisone to treat a drug eruption related  to the amoxicillin in the Prev-Pak.  She is otherwise a healthy white female  who since the last admission has not noticed any blood per stools.   REVIEW OF SYSTEMS:  She has experienced recently a shortness of breath and  tightness when working out on the treadmill for which her cardiologist had  scheduled her for a Cardiolite this week.  Otherwise, she denies fever,  chills, nausea, vomiting, diarrhea.  She obviously has the bright red blood  per rectum.  No dysuria.  She suffers from insomnia on a regular basis which  she treats generally with activity like playing games on the computer  until  she falls asleep.  She denies any weight loss or weight gain.  She has had  no back pain.  All other review of systems are negative.  See HPI for  further ROS.   ALLERGIES:  PENICILLIN.   MEDICATIONS:  1. Lanoxin 0.125 mg daily.  2. Lasix 80 mg daily.  3. Lipitor 80 mg daily.  4. Monopril 20 mg daily.  5. Coumadin 5 mg on all days except Monday and Wednesday at which time she     takes 7.5 mg.  6. Metformin 500 mg b.i.d.  7. K-Dur 20 mg q.i.d.  8. Nitrolingual spray as needed.   SOCIAL HISTORY:  Denies tobacco, ethanol use.   FAMILY HISTORY:  Father died of an MI at age 73, mother died of an MI at 22.  Brother had an  MI at the age of 68 and is status post recent four-vessel  bypass.   PHYSICAL EXAMINATION:  VITAL SIGNS:  Temperature 98.8, blood pressure  120/67, pulse 94, respirations 18, saturations 98%.  GENERAL APPEARANCE:  She is in no acute distress.  She is well-developed,  well-nourished.  HEENT:  Normocephalic, atraumatic.  Pupils equal, round and reactive to  light.  Extraocular muscles intact.  Mucous membranes moist.  She does have  a malar rash that is macular in nature fading on her cheeks.  NECK:  Supple.  No JVD.  No lymph nodes.  No thyromegaly.  CHEST:  Clear to auscultation.  No rhonchi, rales or wheezes.  CARDIOVASCULAR:  Regular rate and rhythm.  Positive S1, S2.  No S3, S4.  No  murmurs, rubs or gallops.  ABDOMEN:  Soft, nontender, nondistended with positive bowel sounds.  No  hepatosplenomegaly.  EXTREMITIES:  Shows the macular rash to her back, legs and perineum, the  most recent eruption, and are still quite red.  NEUROLOGICAL:  Cranial nerves II-XII are intact.  She is nonfocal otherwise.  Power is 5/5.  DTRs 2+.   LABORATORY DATA:  On admission, sodium 157, potassium 4.3, chloride 100, CO2  28, BUN 23, creatinine 1.  Blood glucose level is 143.  White count 7.9,  hemoglobin 10.9, hematocrit 31.2, platelets 347,000.  MCV 81.8, calcium 9.7,   PT 27.6 with INR of 3.9 and PTT of 44.  Her EKG is pending.   ASSESSMENT/PLAN:  This is a 77 year old white female with GI bleed of  unknown origin.  She has had colonoscopy and EGD last month without a source  being found.  The patient gets chronic Coumadin for aortic valve  replacement.  INR is mildly elevated at this time at 3.9.  She has had no  further bowel movements since the initial two this morning.   1. GI:  The patient has had no further bowel movements and laboratory values     reveal stable hematocrit.  We will check her beta CBC and transfuse for     hematocrit less than 30,  especially in light of the patient's current     cardiac workup, we will attempt to keep her hematocrit at 30.  We may     give vitamin K if further bowel movements occur and consider FFP if     needed to control her bleeding.  We will be holding her Coumadin and     place her on Protonix p.o. b.i.d.  We will gently hydrate her, and she     can have clears but will be n.p.o. past midnight.  2. Cardiovascular.  With the recent episodes of shortness of breath and     palpitations, she will be admitted to telemetry.  We will contact the     cardiologist and determine what the findings of Cardiolite stress should     be in light of her GI bleed, or continue to monitor on Lasix if her blood     pressure will tolerate this.  Will treat her K p.r.n.  Will hold her     Coumadin and decide if we need to reverse it further depending on the     number of bowel movements that she has.  Will also check a digoxin level.  3. Endocrine.  She is diabetic and admits holding her last dose the morning     of admission of glucophage.  Will continue to hold her glucophage, so she  can undergo any imaging that might be needed without fear of reaction.  4. GU:  There are no complaints.  5. DVT Prophylaxis will be with Pas hose. 6. GI is already aware of this patient's admission and will see her in the      morning.                                                Melissa L. Lovena Le, MD   MLT/MEDQ  D:  12/10/2003  T:  12/11/2003  Job:  [id number]   cc:   Modena Jansky. Marisue Humble, M.D.  [place]. [place]  [place] [address]  Fax: [phone]

## 2011-02-07 NOTE — Op Note (Signed)
[name], [name]                          ACCOUNT NO.:  [id number]   MEDICAL RECORD NO.:  [id number]                   PATIENT TYPE:  INP   LOCATION:  [place]                                 FACILITY:  [facility]   PHYSICIAN:  John C. Amedeo Plenty, M.D.                 DATE OF BIRTH:  08/02/1933   DATE OF PROCEDURE:  10/29/2003  DATE OF DISCHARGE:                                 OPERATIVE REPORT   PROCEDURE PERFORMED:  Esophagogastroduodenoscopy.   ENDOSCOPIST:  Missy Sabins, M.D.   INDICATIONS FOR PROCEDURE:  Anemia and hematochezia with some black stool  noted and INR of 3.7 with a hemoglobin of 5.4 on presentation.   DESCRIPTION OF PROCEDURE:  The patient was placed in the left lateral  decubitus position and placed on the pulse monitor with continuous low-flow  oxygen delivered by nasal cannula.  She was sedated with 25 mcg IV fentanyl  and 4 mg IV Versed and was also give 500 mg IV vancomycin and 100 mg IV  gentamicin prior to the procedure. The Olympus video endoscope was advanced  under direct vision into the oropharynx and esophagus.  The esophagus was  straight, of normal caliber with the squamocolumnar line at 38 cm.  There  was no visible hiatal hernia, ring, stricture or other abnormality of the GE  junction or distal esophagus.  The stomach was entered and a small amount of  liquid secretions was suctioned from the fundus.  A retroflex view of the  cardia was unremarkable.  The fundus and body appeared normal.  The antrum  showed two or three elevated areas with some adherent exudate consistent  with small erosions.  One of them had a slightly dirty base but no other  frank stigma of hemorrhage, no blood was seen in the stomach.  The pylorus  was nondeformed and easily allowed passage of the endoscope tip into the  duodenum.  Both the bulb and second portion were well inspected and appeared  to be within normal limits.  The scope was then withdrawn and the patient  was returned  to the recovery room in stable condition.  The patient  tolerated the procedure well.  There were no immediate complications.   IMPRESSION:  Antral erosions without active bleeding.   PLAN:  1. Will check Helicobacter pylori antibody and treat with proton pump     inhibitor  2. Will need colonoscopy at some point with subacute bacterial endocarditis     prophylaxis for her St. Jude heart valve.                                               John C. Amedeo Plenty, M.D.    JCH/MEDQ  D:  10/29/2003  T:  10/30/2003  Job:  [id number]  cc:   Jerelene Redden, MD   Fabio Asa, M.D.  [address] [place]  [place] [address]  Fax: [place] Marisue Humble, M.D.  [place]. [place]  [place] [address]  Fax: [phone]

## 2011-02-07 NOTE — Consult Note (Signed)
[name], [name]                          ACCOUNT NO.:  [id number]   MEDICAL RECORD NO.:  [id number]                   PATIENT TYPE:  INP   LOCATION:  [place]                                 FACILITY:  [facility]   PHYSICIAN:  John C. Amedeo Plenty, M.D.                 DATE OF BIRTH:  07-07-1933   DATE OF CONSULTATION:  10/29/2003  DATE OF DISCHARGE:                                   CONSULTATION   REASON FOR CONSULTATION:  Gastrointestinal bleeding.   HISTORY OF PRESENT ILLNESS:  The patient is a 77 year old white female with  history of St. Jude aortic valve who presented with some dark stools several  days ago which subsequently cleared, followed by gradual weakness and  dyspnea on exertion. This morning, she had urgency and passed significant  amount of bright red blood per rectum with increased weakness but no  abdominal pain. She has had no nausea or vomiting and no near syncope. She  has had a sigmoidoscopy in the past but never any other upper or lower GI  studies.   PAST MEDICAL HISTORY:  1. History of aortic valve replacement.  2. Hyperlipidemia.  3. Possible history of cardiac arrhythmia.   MEDICATIONS:  1. Lanoxin 0.125 mg a day.  2. Lasix 80 mg a day.  3. Lipitor 80 mg a day.  4. Coumadin 5 mg alternating with 7.5 mg every other day.  5. KCl 20 mg a day.   FAMILY HISTORY:  Positive for myocardial infarction in two first degree  relatives. Negative for GI malignancy.   SOCIAL HISTORY:  The patient denies any alcohol or tobacco use.   PHYSICAL EXAMINATION:  GENERAL:  Well-developed, well-nourished, slightly  pale white female in no acute distress.  HEART:  Regular rate and rhythm with 3/6 systolic murmur best heard in the  aortic area.  LUNGS:  Clear.  ABDOMEN:  Soft, nondistended, with normal active bowel sounds. No  hepatosplenomegaly, masses, or guarding.   LABORATORY DATA:  Hemoglobin 5.4, hematocrit 16.8, WBC 6,100. PT 26.9, PTT  38, BUN 19, creatinine 0.9.   IMPRESSION:  Presentation of acute gastrointestinal bleeding, probably  lower, but with possible upper GI bleeding occurring earlier, in a patient  who is anticoagulated on Coumadin by necessity for her mechanical prosthetic  heart valve.   PLAN:  We are going to work up with EGD today to see if there is any high  risk upper GI bleeding lesions. This will determine to what extent we need  to reverse her anticoagulation, and at some point, she will need  colonoscopy.                                               John C. Amedeo Plenty, M.D.    JCH/MEDQ  D:  10/29/2003  T:  10/30/2003  Job:  [id number]   cc:   Cherly Hensen, M.D.   Jerelene Redden, MD

## 2011-02-07 NOTE — H&P (Signed)
NAMESHATINA, ROHLER                          ACCOUNT NO.:  [id number]   MEDICAL RECORD NO.:  [id number]                   PATIENT TYPE:  EMS   LOCATION:  [place]                                 FACILITY:  [facility]   PHYSICIAN:  Jerelene Redden, MD                   DATE OF BIRTH:  Jan 02, 1933   DATE OF ADMISSION:  10/29/2003  DATE OF DISCHARGE:                                HISTORY & PHYSICAL   [name] is a very pleasant 77 year old lady who is normally quite  physically active.  She presents at this time with a 10-day history of  intermittent black stools.  In addition, over this period of time, she has  noted increasing shortness of breath to the point where she is really not  able to do any physical activity.  She had presented to [facility] and was seen by Dr. Alroy Dust, who suspected that she might have a  bronchitis and gave her a metered dose inhaler.  Today, she awakened at  about 3 a.m. and passed a large amount of red blood per rectum.  She had a  second episode a couple of hours later.  She was transferred to [facility]  emergency room by her husband.  She denies any recent history of headaches,  cough, shortness of breath, nausea, vomiting, abdominal pain, hematemesis,  or dysuria.  In the emergency room, she was noted to have a blood pressure  initially of 125/38 with a pulse of 92.  Her hemoglobin was noted to be 5.4  with an MCV of 76, platelet count 403,000.  INR was 3.7.  Of major  significance is that this patient is status post St. Jude prosthetic aortic  valve in 1994 and has been on chronic Coumadin therapy with the goal of  keeping her INR between 2.5 and 3.5.  Her anticoagulation has been somewhat  difficult to regulate.  Also of significance is that the patient had a  normal flexible sigmoidoscopy done by Dr. Valli Glance one year ago.   MEDICATIONS:  1. Lanoxin 0.125 mg daily.  2. Lasix 80 mg daily.  3. Lipitor 80 mg daily.  4. Monopril 40 mg  daily.  5. Coumadin 5 alternating with 7.5 mg daily.  6. KCL 20 mEq daily.  7. Metformin 1 gm b.i.d.   PAST MEDICAL HISTORY:  Patient has no previous history of gastrointestinal  bleeding.  She has been diagnosed with diabetes.  She states that her blood  sugars are generally very well-regulated on her regimen of Metformin 1 gm  b.i.d.   OPERATIONS:  The patient has had a St. Jude prosthetic valve in 1994.  She  also underwent CABG in 1990.  She has had a previous laparoscopy and  appendectomy.   FAMILY HISTORY:  The patient's mother and father both died of an MI in their  mid 68s.  She has a  brother who had an MI at the age of 90 but fortunately  has survived this experience.  Another brother has high blood pressure.   SOCIAL HISTORY:  She is generally very physically active.  She denies any  history of cigarette smoking or alcohol abuse.  She denies the abuse of  drugs.   REVIEW OF SYSTEMS:  HEAD:  She denies headache or dizziness.  EYES:  She  denies visual blurring or diplopia.  ENT:  Denies earache, sinus pain, or  sore throat.  CHEST:  Denies coughing or wheezing.  There has been no chest  pain.  CARDIOVASCULAR:  She has noted a little bit of orthopnea in the last  48 hours.  GI:  See above.  There has been no hematemesis.  GU:  Denies  dysuria or urinary frequency.  NEURO:  Denies seizure or stroke.  ENDO:  Denies excessive thirst or urinary frequency.   PHYSICAL EXAMINATION:  VITAL SIGNS:  Her blood pressure is 111/39.  Pulse is  96.  Respirations are 20.  O2 saturation is 98%.  HEENT:  Remarkable for marked facial pallor.  CHEST:  Clear.  CARDIOVASCULAR:  She has a crisp valve click best heard in the area of the  left sternal border.  There is a grade 3/6 systolic murmur, best heard in  the aortic area.  ABDOMEN:  Within normal limits.  There are no masses or tenderness.  No  organomegaly.  Stools are maroon-colored.  EXTREMITIES:  Within normal limits.  NEUROLOGIC:   Within normal limits.   INR is 3.7.  White count is 6100.  Hemoglobin is 5.9, platelets 403,000.   IMPRESSION:  1. Lower gastrointestinal bleeding, probably going on for about 10 days with     marked blood loss anemia.  2. Dyspnea secondary to blood loss anemia.  3. History of St. Jude's aortic valve in 1994, chronic Coumadin.  4. History of coronary artery bypass graft in 1996.  5. History of laparoscopy and appendectomy.  6. History of normal sigmoidoscopy in 2003.  7. History of hyperlipidemia.  8. History of diabetes.   The plan will be to admit the patient to a monitored bed in an ICU setting  where we will be able to follow her hemodynamics and blood loss.  I had a  conversation with Dr. Golden Hurter and with Dr. Leonia Reeves in regards to the  management of her prosthetic heart valve.  It is their consensus that the  patient should not receive any Coumadin at this time but that no vitamin K  or fresh frozen plasma should be administered to reverse the bleeding.  They  also emphasize the importance of obtaining GI consultation.  Dr. Amedeo Plenty of  [facility] GI service was consulted for the purpose of evaluating the source of  the GI bleeding.  The patient will be made n.p.o.  We will follow her blood  sugars closely and administer sliding-scale insulin regimen.  Hemoglobin and  hematocrit will also be very closely monitored.                                                Jerelene Redden, MD    SY/MEDQ  D:  10/29/2003  T:  10/30/2003  Job:  [id number]   cc:   Fabio Asa, M.D.  [address] [place]  [id number]  Fax: [place] Amedeo Plenty, M.D.  [id number], [place]  [place] [address]  Fax: [phone]   Dr. Lesle Reek [facility]

## 2011-02-07 NOTE — Consult Note (Signed)
[name], [name]                          ACCOUNT NO.:  [id number]   MEDICAL RECORD NO.:  [id number]                   PATIENT TYPE:  INP   LOCATION:  2115                                 FACILITY:  [facility]   PHYSICIAN:  Fabio Asa, M.D.                 DATE OF BIRTH:  [birth year]   DATE OF CONSULTATION:  10/30/2003  DATE OF DISCHARGE:                                   CONSULTATION   INDICATION FOR CONSULTATION:  A 77 year old female with known coronary  artery disease status post St. Jude aortic valve with GI bleed.  [name] is a  77 year old female known to me from previous evaluation.  [name] had coronary  artery bypass surgery in 1990 and subsequent St. Jude prosthetic valve in  1994.  [name] has been doing well from a cardiac standpoint with the exception  of some difficulty in maintaining her INR. During the past two weeks, [name]  has had to be followed on a weekly basis for an INR as high as 4.5 and as  low as 1.7.  The patient noted no change in her diet.  [name] presented to the  emergency room with a 10-day history of intermittent black stools.  [name] did  not bring this to the attention of the pharmacist at the time of her  Coumadin check.  [name] attributed it to her dietary habits.  On the day of  admission, [name] had a large amount of bright red blood per rectum and  subsequently presented to the emergency room.  In the emergency room, [name]  was noted to have a  blood pressure of 125/38 with a pulse of 92.  Her  hemoglobin was noted to 5.4.  Her INR was 3.7.   PAST MEDICAL HISTORY:  Past medical history is as noted above.  1. No previous history of GI bleed.  2. Diabetes mellitus.  3. Hypertension.  4. Coronary artery disease status post coronary artery bypass graft in 1990.  5. Status post St. Jude prosthetic valve in 1994.   MEDICATIONS PRIOR TO ADMISSION:  1. Lanoxin 0.125 mg daily.  2. Lasix 80 mg daily.  3. Lipitor 80 mg daily.  4. Monopril 40 mg daily.  5. Coumadin 5 mg  alternating with 7.5 mg.  6. Potassium 20 mEq daily.  7. Metformin 1 g b.i.d.   FAMILY HISTORY:  The patient's mother and father both passed from myocardial  infarction in the mid 2s.  [name] has had one brother who had myocardial  infarction at age 19.  Another brother with hypertension.   SOCIAL HISTORY:  [name] is married.  [name] denies tobacco or alcohol use.  [name]  normally is very active.   REVIEW OF SYSTEMS:  [name] has had no headaches, no presyncope, no syncope.  No  chest pain.  [name] has had increased fatigue and shortness of breath.  [name] has  had no hematemesis.  PHYSICAL EXAMINATION:  GENERAL:  Reveals an elderly female in no acute  distress currently laughing and in good spirits.  VITAL SIGNS:  Blood pressure is 132/55, heart rate 75, respiratory rate 20.  [name] is afebrile.  HEENT:  Unremarkable.  There is no neck vein distention.  PULMONARY:  Reveals breath sounds which are equal and clear to auscultation.  No use of accessory muscles.  CARDIOVASCULAR:  Reveals a crisp mechanical valve click.  There is a 2/6  early to mid peaking systolic murmur noted.  Regular  rate and rhythm.  ABDOMEN:  Soft and nontender.  [name] is noted to have hyperactive bowel  sounds.  EXTREMITIES:  Reveal distal pulses which are 2+ and equal.  SKIN:  Warm and dry.  NEURO:  Nonfocal.   LABORATORY DATA:  Hemoglobin initially 5.4, now 8.4.  The patient has  received a total of five units of packed cells and two units of fresh frozen  plasma.  Her creatinine is 0.9.  Potassium 3.9.  Her initial INR was 3.7.  Currently, it is 1.7 following the fresh frozen plasma.  ECG reveals normal  sinus rhythm, nonspecific ST changes.  EGD on February 6 revealed antral  erosions only.  No active bleeding.   IMPRESSION:  59. A 77 year old female with recent GI bleed complicated by prosthetic     aortic valve and need for anticoagulation.  The patient is now     subtherapeutic.  [name] will be at a low risk for thrombotic  event.  [name] has     no other risk factors for thrombosis.  Agree with performing the     colonoscopy when the source of bleeding has been identified and     controlled.  Will restart Coumadin.  2. Coronary artery disease.  The patient did have increased dyspnea with a     low hemoglobin.  Will defer further ischemic workup at this time.  3. Hypertension.  Blood pressure is adequately controlled.  4. Dyslipidemia.  Will continue with her Lipitor at 80 mg daily.  5. Agree with the need for bacterial endocarditis prophylaxis with     vancomycin and gentamycin.                                               Fabio Asa, M.D.    HP/MEDQ  D:  10/30/2003  T:  10/30/2003  Job:  [id number]   cc:   Elyse Jarvis. Amedeo Plenty, M.D.  [id number], [place]  [place] [address]  Fax: [phone]   [place] Marisue Humble, M.D.  [place]. [place]  [place] [address]  Fax: [phone]

## 2011-02-07 NOTE — Discharge Summary (Signed)
[name], [name]                          ACCOUNT NO.:  [id number]   MEDICAL RECORD NO.:  [id number]                   PATIENT TYPE:  INP   LOCATION:  3709                                 FACILITY:  [facility]   PHYSICIAN:  Ashby Dawes. Polite, M.D.              DATE OF BIRTH:  07-10-1933   DATE OF ADMISSION:  10/29/2003  DATE OF DISCHARGE:                                 DISCHARGE SUMMARY   DISCHARGE DIAGNOSES:  1. Critical anemia secondary to GI loss, source not identified by     esophagogastroduodenoscopy or colonoscopy.  Patient for further     outpatient evaluation, capsule endoscopy, repeat     esophagogastroduodenoscopy, colonoscopy per Dr. Amedeo Plenty.  2. Congestive heart failure secondary to volume resuscitation resolved at     the time of discharge.  3. Diabetes.  4. Coronary artery disease status post coronary artery bypass graft in 1990     status post St. Jude prostatic valve on Coumadin.  Of note, INR had to be     reduced secondary to GI bleed.  The patient to followup at the Coumadin     Clinic with Dr. Jaci Standard.  5. Hypertension.  6. Hypokalemia resolved.   DISCHARGE MEDICATIONS:  1. Lanoxin 0.125 mg q.d.  2. Lasix 80 mg q.d.  3. Lipitor 80 mg q.d.  4. Monopril 40 mg q.d.  5. Coumadin 5 mg alternating with 7.5 mg.  6. Kay Ciel 20 mg q.d.  7. Metformin 1 g b.i.d.  8. Protonix 40 mg daily.   DISPOSITION:  The patient is discharged to home in stable condition asked to  followup with Dr. Fabio Asa, asked to followup with the Coumadin Clinic  and asked to followup with Dr. Teena Irani as well as followup with her  primary M.D. in two weeks.   STUDIES:  The patient had a colonoscopy which showed a diminutive rectal  polyp, internal hemorrhoids otherwise totally normal study with no blood  seen anywhere including the terminal ileum.  The patient had an EGD which  showed antral erosions without active bleeding.   CBC on admission significant for a hemoglobin of 5.4,  at discharge  hemoglobin 10.2.  Last INR 1.2, on admission 2.7.  BMET on admission within  normal limits except for hyperglycemia. CBG of 174.  BNP 262, cardiac  enzymes negative x3.   Chest x-ray, cardiomegaly, questionable mild interstitial pulmonary edema,  this was on February 9.   HISTORY OF PRESENT ILLNESS:  [name] is a 77 year old female with known  history of hypertension, diabetes, coronary artery disease, status post St.  Jude valve who presented to the ED for evaluation of progressive dyspnea and  intermittent black stools. The day of presentation to the ED, the patient  passed a large amount of red blood per rectum. Labs and evaluation showed  critical anemia of 5.4.  Admission to the hospital was deemed necessary for  further evaluation and treatment.  Please see dictated HPI for further  details of the history of present illness.   PAST MEDICAL HISTORY:  As stated above.   MEDICATIONS:  As stated above.   HOSPITAL COURSE:  #1.  CRITICAL ANEMIA SECONDARY TO GI BLEED.  As stated Ms.  [name] was admitted to [facility] for evaluation and treatment of  critical anemia with associated symptoms of shortness of breath. The patient  was admitted to ICU, she was resuscitated with packed red blood cells and  also given FFP and vitamin K to reverse her INR.  The patient was seen in  consultation by cardiology as well as GI.  It is felt that the patient's INR  could be reversed down to 1.8 and procedures i.e.  EGD and colonoscopy then  could be performed.  The patient received a total of 5 units of blood as  well as FFP and underwent the procedure EGD which only showed antral erosion  as well as a colonoscopy which also did not reveal the site of bleeding.  After these procedures, the patient's hemoglobin remained stable and there  were no more recurrent bleeds.  Expected measurement was outlined by GI  which included capsule endoscopy or repeat endoscopy and a  colonoscopy if  the patient rebled.  The patient was cleared to have her anticoagulation  resume and again as stated expected management has been outlined. The  patient will followup with Dr. Amedeo Plenty on an outpatient basis for further  evaluation and treatment of this.  As for the patient's anticoagulation, she  has been cleared to resume her anticoagulation. It is not felt that a bridge  with heparin is needed as this is felt that it may make her more of risk for  recurrent bleed.  The patient did not have any bleeding x72 hours prior to  discharge and at this time was deemed stable for discharge.  #2.  CONGESTIVE HEART FAILURE.  The patient went into mild volume overload  secondary to multiple blood products to correct her anemia and her  coagulopathy. This was treated conservatively with Lasix with a good result.  The patient had cardiac enzymes which were negative x3 and did have a mild  bump in her BNP as stated above.  No further cardiac evaluation is indicated  as this time. Of note, the patient does have a known history of coronary  artery disease, CABG and St. Jude valve. The patient will be followed by Dr.  Fabio Asa on an outpatient basis.  #3.  ST. JUDE VALVE ON COUMADIN THERAPY.  As stated in #1, the patient's INR  will be allowed to trend up slowly. Her last INR with this hospitalization  is 1.3.  The case was discussed with cardiology and it is felt safe for the  patient to be discharged to home and allowed Coumadin to trend up slowly and  she is being followed up in the Coumadin clinic.  This has been outlined  with the patient and her husband.  The patient had several other medical  problems which are fairly stable which she will continue her current  outpatient medications.  At this time the patient is deemed stable for  discharge.  Ashby Dawes. Polite, M.D.   RDP/MEDQ  D:  11/04/2003  T:  11/04/2003  Job:  [id number]   cc:    Modena Jansky. Marisue Humble, M.D.  [place]. [place] [address]  Fax: [phone]   Fabio Asa, M.D.  [place]. [address]  [place] [address]  Fax: [place]. Amedeo Plenty, M.D.  [id number] [place]  [place] [address]  Fax: [phone]

## 2011-06-19 LAB — BASIC METABOLIC PANEL
BUN: 10
BUN: 8
CO2: 22
CO2: 28
CO2: 28
Calcium: 7.5 — ABNORMAL LOW
Calcium: 7.7 — ABNORMAL LOW
Calcium: 7.9 — ABNORMAL LOW
Calcium: 9.5
Chloride: 101
Chloride: 101
Chloride: 105
Chloride: 105
Chloride: 108
Creatinine, Ser: 0.78
Creatinine, Ser: 0.83
Creatinine, Ser: 0.88
Creatinine, Ser: 0.93
GFR calc Af Amer: 50 — ABNORMAL LOW
GFR calc Af Amer: >60
GFR calc Af Amer: >60
GFR calc Af Amer: >60
GFR calc Af Amer: >60
GFR calc Af Amer: >60
GFR calc Af Amer: >60
GFR calc non Af Amer: 59 — ABNORMAL LOW
GFR calc non Af Amer: >60
GFR calc non Af Amer: >60
Glucose, Bld: 135 — ABNORMAL HIGH
Glucose, Bld: 149 — ABNORMAL HIGH
Potassium: 3.5
Potassium: 3.7
Potassium: 3.9
Potassium: 4.3
Potassium: 4.4
Sodium: 136
Sodium: 137
Sodium: 140

## 2011-06-19 LAB — COMPREHENSIVE METABOLIC PANEL
ALT: 181 — ABNORMAL HIGH
ALT: 71 — ABNORMAL HIGH
ALT: 71 — ABNORMAL HIGH
ALT: 75 — ABNORMAL HIGH
ALT: 88 — ABNORMAL HIGH
AST: 105 — ABNORMAL HIGH
AST: 210 — ABNORMAL HIGH
AST: 68 — ABNORMAL HIGH
AST: 90 — ABNORMAL HIGH
Albumin: 1.7 — ABNORMAL LOW
Alkaline Phosphatase: 114
Alkaline Phosphatase: 34 — ABNORMAL LOW
Alkaline Phosphatase: 79
BUN: 17
CO2: 26
CO2: 26
CO2: 28
CO2: 28
Calcium: 7.6 — ABNORMAL LOW
Calcium: 7.9 — ABNORMAL LOW
Calcium: 8 — ABNORMAL LOW
Calcium: 8 — ABNORMAL LOW
Chloride: 105
Chloride: 105
Chloride: 97
Chloride: 99
Creatinine, Ser: 0.82
Creatinine, Ser: 0.87
GFR calc Af Amer: 52 — ABNORMAL LOW
GFR calc Af Amer: >60
GFR calc Af Amer: >60
GFR calc non Af Amer: 43 — ABNORMAL LOW
GFR calc non Af Amer: >60
GFR calc non Af Amer: >60
GFR calc non Af Amer: >60
GFR calc non Af Amer: >60
Glucose, Bld: 121 — ABNORMAL HIGH
Glucose, Bld: 209 — ABNORMAL HIGH
Glucose, Bld: 214 — ABNORMAL HIGH
Glucose, Bld: 95
Potassium: 3.7
Potassium: 3.8
Potassium: 4
Potassium: 4.1
Sodium: 132 — ABNORMAL LOW
Sodium: 133 — ABNORMAL LOW
Sodium: 134 — ABNORMAL LOW
Sodium: 136
Sodium: 141
Total Bilirubin: 5.4 — ABNORMAL HIGH
Total Bilirubin: 8.5 — ABNORMAL HIGH
Total Bilirubin: 9 — ABNORMAL HIGH
Total Protein: 5 — ABNORMAL LOW
Total Protein: 5.2 — ABNORMAL LOW

## 2011-06-19 LAB — CBC
HCT: 20.5 — ABNORMAL LOW
HCT: 24.2 — ABNORMAL LOW
HCT: 29.7 — ABNORMAL LOW
HCT: 32.3 — ABNORMAL LOW
HCT: 36.5
Hemoglobin: 10.3 — ABNORMAL LOW
Hemoglobin: 11 — ABNORMAL LOW
Hemoglobin: 11.4 — ABNORMAL LOW
Hemoglobin: 13
Hemoglobin: 7.3 — CL
Hemoglobin: 8.5 — ABNORMAL LOW
Hemoglobin: 9.4 — ABNORMAL LOW
Hemoglobin: 9.7 — ABNORMAL LOW
MCHC: 34.1
MCHC: 34.7
MCHC: 34.8
MCHC: 34.8
MCHC: 35.3
MCHC: 35.5
MCHC: 35.7
MCHC: 35.7
MCV: 88.2
MCV: 88.3
MCV: 88.7
MCV: 88.9
MCV: 89.3
MCV: 89.6
MCV: 90.3
MCV: 90.3
Platelets: 120 — ABNORMAL LOW
Platelets: 237
Platelets: 268
Platelets: 298
Platelets: 403 — ABNORMAL HIGH
Platelets: 89 — ABNORMAL LOW
RBC: 2.31 — ABNORMAL LOW
RBC: 2.77 — ABNORMAL LOW
RBC: 2.81 — ABNORMAL LOW
RBC: 3.02 — ABNORMAL LOW
RBC: 3.09 — ABNORMAL LOW
RBC: 3.29 — ABNORMAL LOW
RBC: 3.31 — ABNORMAL LOW
RBC: 3.46 — ABNORMAL LOW
RBC: 3.61 — ABNORMAL LOW
RBC: 3.71 — ABNORMAL LOW
RBC: 4.13
RDW: 13.1
RDW: 14.5
RDW: 15.4
WBC: 10.6 — ABNORMAL HIGH
WBC: 10.6 — ABNORMAL HIGH
WBC: 13.9 — ABNORMAL HIGH
WBC: 4.9
WBC: 5.3
WBC: 5.9
WBC: 7.5
WBC: 8.4
WBC: 9.2
WBC: 9.8

## 2011-06-19 LAB — POCT I-STAT 7, (LYTES, BLD GAS, ICA,H+H)
Acid-base deficit: 20 — ABNORMAL HIGH
Bicarbonate: 20.4
Bicarbonate: 8.7 — ABNORMAL LOW
HCT: 13 — ABNORMAL LOW
Hemoglobin: 4.4 — CL
Hemoglobin: 7.8 — CL
O2 Saturation: 100
Operator id: 114421
Patient temperature: 34.4
Sodium: 137
Sodium: 141
TCO2: 10
TCO2: 22
pH, Arterial: 7.02 — CL
pH, Arterial: 7.266 — ABNORMAL LOW
pO2, Arterial: 535 — ABNORMAL HIGH

## 2011-06-19 LAB — CROSSMATCH
ABO/RH(D): O NEG
Antibody Screen: NEGATIVE
Antibody Screen: NEGATIVE

## 2011-06-19 LAB — PREPARE FRESH FROZEN PLASMA

## 2011-06-19 LAB — HEPATIC FUNCTION PANEL
AST: 61 — ABNORMAL HIGH
Albumin: 1.7 — ABNORMAL LOW
Alkaline Phosphatase: 73
Bilirubin, Direct: 5.4 — ABNORMAL HIGH
Indirect Bilirubin: 3.4 — ABNORMAL HIGH
Total Bilirubin: 8.8 — ABNORMAL HIGH
Total Protein: 5 — ABNORMAL LOW

## 2011-06-19 LAB — URINE MICROSCOPIC-ADD ON

## 2011-06-19 LAB — POCT I-STAT 3, ART BLOOD GAS (G3+)
Acid-Base Excess: 4 — ABNORMAL HIGH
Acid-Base Excess: 5 — ABNORMAL HIGH
Bicarbonate: 27 — ABNORMAL HIGH
O2 Saturation: 100
O2 Saturation: 98
Operator id: 277331
Patient temperature: 98.6
TCO2: 28
TCO2: 32

## 2011-06-19 LAB — PREPARE RBC (CROSSMATCH)

## 2011-06-19 LAB — CARDIAC PANEL(CRET KIN+CKTOT+MB+TROPI)
CK, MB: 40.8 — ABNORMAL HIGH
Relative Index: 6.4 — ABNORMAL HIGH
Relative Index: 7.1 — ABNORMAL HIGH
Total CK: 119
Total CK: 717 — ABNORMAL HIGH
Troponin I: 0.22 — ABNORMAL HIGH

## 2011-06-19 LAB — PROTIME-INR
INR: 1.4
INR: 1.9 — ABNORMAL HIGH
INR: 2 — ABNORMAL HIGH
INR: 4.3 — ABNORMAL HIGH
INR: 4.4 — ABNORMAL HIGH
Prothrombin Time: 14.8
Prothrombin Time: 17.9 — ABNORMAL HIGH
Prothrombin Time: 19.3 — ABNORMAL HIGH
Prothrombin Time: 22 — ABNORMAL HIGH
Prothrombin Time: 23.4 — ABNORMAL HIGH
Prothrombin Time: 26.6 — ABNORMAL HIGH
Prothrombin Time: 35.1 — ABNORMAL HIGH
Prothrombin Time: 35.7 — ABNORMAL HIGH
Prothrombin Time: 39 — ABNORMAL HIGH
Prothrombin Time: 43.3 — ABNORMAL HIGH
Prothrombin Time: 43.9 — ABNORMAL HIGH

## 2011-06-19 LAB — DIFFERENTIAL
Eosinophils Absolute: 0
Lymphocytes Relative: 5 — ABNORMAL LOW
Lymphs Abs: 0.6 — ABNORMAL LOW
Neutrophils Relative %: 92 — ABNORMAL HIGH

## 2011-06-19 LAB — HEPARIN LEVEL (UNFRACTIONATED)
Heparin Unfractionated: 0.67
Heparin Unfractionated: 0.76 — ABNORMAL HIGH

## 2011-06-19 LAB — URINALYSIS, ROUTINE W REFLEX MICROSCOPIC
Glucose, UA: NEGATIVE
Protein, ur: 100 — AB

## 2011-06-19 LAB — AMYLASE: Amylase: 205 — ABNORMAL HIGH

## 2011-06-19 LAB — HEMOGLOBIN AND HEMATOCRIT, BLOOD
HCT: 20.2 — ABNORMAL LOW
Hemoglobin: 7 — CL

## 2011-06-19 LAB — BLOOD GAS, ARTERIAL
Bicarbonate: 22.4
FIO2: 1
MECHVT: 550
TCO2: 23.6
pCO2 arterial: 34.6 — ABNORMAL LOW
pH, Arterial: 7.412 — ABNORMAL HIGH

## 2011-06-19 LAB — CULTURE, BLOOD (ROUTINE X 2)

## 2011-06-19 LAB — MAGNESIUM: Magnesium: 1.6

## 2011-06-19 LAB — HEPARIN ANTIBODY SCREEN: Heparin Antibody Screen: NEGATIVE

## 2011-06-20 LAB — CBC
HCT: 34.3 — ABNORMAL LOW
HCT: 34.8 — ABNORMAL LOW
HCT: 36.8
HCT: 38.2
HCT: 38.5
HCT: 38.6
HCT: 40.9
Hemoglobin: 11.7 — ABNORMAL LOW
Hemoglobin: 12
Hemoglobin: 12.1
Hemoglobin: 12.9
Hemoglobin: 13
Hemoglobin: 13.2
Hemoglobin: 13.3
Hemoglobin: 13.8
MCHC: 33.8
MCHC: 33.5
MCHC: 33.5
MCHC: 33.8
MCHC: 33.8
MCHC: 33.8
MCHC: 33.8
MCHC: 33.8
MCHC: 33.9
MCHC: 33.9
MCV: 88.4
MCV: 91.4
MCV: 88.5
MCV: 88.8
MCV: 89.2
MCV: 89.6
MCV: 90.6
Platelets: 179
Platelets: 192
Platelets: 297
Platelets: 190
Platelets: 191
Platelets: 207
Platelets: 211
Platelets: 261
Platelets: 269
Platelets: 273
Platelets: 296
Platelets: 300
RBC: 4.03
RBC: 4.15
RDW: 15.5
RDW: 15.8 — ABNORMAL HIGH
RDW: 16 — ABNORMAL HIGH
RDW: 15.2
RDW: 15.2
RDW: 15.4
RDW: 15.6 — ABNORMAL HIGH
RDW: 15.8 — ABNORMAL HIGH
RDW: 15.9 — ABNORMAL HIGH
RDW: 15.9 — ABNORMAL HIGH
RDW: 16.1 — ABNORMAL HIGH
RDW: 16.4 — ABNORMAL HIGH
WBC: 6.9
WBC: 8.1
WBC: 6.1
WBC: 9.5
WBC: 9.6

## 2011-06-20 LAB — CROSSMATCH

## 2011-06-20 LAB — BASIC METABOLIC PANEL
BUN: 14
BUN: 18
CO2: 27
CO2: 28
Calcium: 8.8
Calcium: 9.1
Calcium: 9.1
Chloride: 104
Creatinine, Ser: 1.13
Creatinine, Ser: 0.96
Creatinine, Ser: 1
Creatinine, Ser: 1
GFR calc Af Amer: 56 — ABNORMAL LOW
GFR calc Af Amer: >60
GFR calc Af Amer: >60
GFR calc non Af Amer: 47 — ABNORMAL LOW
GFR calc non Af Amer: 46 — ABNORMAL LOW
GFR calc non Af Amer: 55 — ABNORMAL LOW
GFR calc non Af Amer: 55 — ABNORMAL LOW
GFR calc non Af Amer: 57 — ABNORMAL LOW
Glucose, Bld: 101 — ABNORMAL HIGH
Glucose, Bld: 113 — ABNORMAL HIGH
Potassium: 4.4
Sodium: 136
Sodium: 136
Sodium: 136

## 2011-06-20 LAB — COMPREHENSIVE METABOLIC PANEL
Albumin: 3 — ABNORMAL LOW
BUN: 11
Calcium: 8.3 — ABNORMAL LOW
Glucose, Bld: 105 — ABNORMAL HIGH
Potassium: 3 — ABNORMAL LOW
Total Protein: 5.6 — ABNORMAL LOW

## 2011-06-20 LAB — CARDIAC PANEL(CRET KIN+CKTOT+MB+TROPI)
CK, MB: 9.1 — ABNORMAL HIGH
Relative Index: 4.6 — ABNORMAL HIGH
Relative Index: 5.2 — ABNORMAL HIGH
Total CK: 199 — ABNORMAL HIGH
Total CK: 229 — ABNORMAL HIGH
Troponin I: 1.02

## 2011-06-20 LAB — B-NATRIURETIC PEPTIDE (CONVERTED LAB)
Pro B Natriuretic peptide (BNP): 330 — ABNORMAL HIGH
Pro B Natriuretic peptide (BNP): 85
Pro B Natriuretic peptide (BNP): 976 — ABNORMAL HIGH

## 2011-06-20 LAB — POCT I-STAT, CHEM 8
BUN: 25 — ABNORMAL HIGH
Creatinine, Ser: 1.5 — ABNORMAL HIGH
Glucose, Bld: 97
Potassium: 3.7
Sodium: 131 — ABNORMAL LOW
TCO2: 25

## 2011-06-20 LAB — PROTIME-INR
INR: 6
INR: 0.9
INR: 1.1
INR: 1.2
INR: 1.2
INR: 1.7 — ABNORMAL HIGH
INR: 2 — ABNORMAL HIGH
Prothrombin Time: 19.7 — ABNORMAL HIGH
Prothrombin Time: 59.7 — ABNORMAL HIGH
Prothrombin Time: 14.4
Prothrombin Time: 15.4 — ABNORMAL HIGH
Prothrombin Time: 15.6 — ABNORMAL HIGH
Prothrombin Time: 17 — ABNORMAL HIGH
Prothrombin Time: 19.4 — ABNORMAL HIGH
Prothrombin Time: 23.7 — ABNORMAL HIGH

## 2011-06-20 LAB — PREPARE FRESH FROZEN PLASMA

## 2011-06-20 LAB — URINE CULTURE

## 2011-06-20 LAB — HEPATIC FUNCTION PANEL
ALT: 32
AST: 43 — ABNORMAL HIGH
Albumin: 3.3 — ABNORMAL LOW
Albumin: 3.3 — ABNORMAL LOW
Alkaline Phosphatase: 37 — ABNORMAL LOW
Alkaline Phosphatase: 41
Indirect Bilirubin: 0.8
Total Protein: 5.5 — ABNORMAL LOW
Total Protein: 5.6 — ABNORMAL LOW

## 2011-06-20 LAB — DIFFERENTIAL
Basophils Absolute: 0
Basophils Relative: 0
Basophils Relative: 0
Eosinophils Absolute: 0
Eosinophils Absolute: 0
Eosinophils Relative: 0
Monocytes Relative: 8
Neutro Abs: 4.7
Neutrophils Relative %: 57
Neutrophils Relative %: 59

## 2011-06-20 LAB — HEPARIN LEVEL (UNFRACTIONATED)
Heparin Unfractionated: 0.23 — ABNORMAL LOW
Heparin Unfractionated: 0.34
Heparin Unfractionated: 0.35
Heparin Unfractionated: 0.38

## 2011-06-20 LAB — URINALYSIS, ROUTINE W REFLEX MICROSCOPIC
Bilirubin Urine: NEGATIVE
Hgb urine dipstick: NEGATIVE
Specific Gravity, Urine: 1.009
Urobilinogen, UA: 0.2
pH: 6

## 2011-06-20 LAB — CK TOTAL AND CKMB (NOT AT ARMC)
Relative Index: 4.6 — ABNORMAL HIGH
Total CK: 152

## 2011-06-20 LAB — URINE MICROSCOPIC-ADD ON

## 2011-06-20 LAB — APTT: aPTT: 45 — ABNORMAL HIGH

## 2011-06-20 LAB — MAGNESIUM: Magnesium: 2.5

## 2011-06-20 LAB — POCT CARDIAC MARKERS: CKMB, poc: 1.2

## 2011-09-30 DIAGNOSIS — Z7901 Long term (current) use of anticoagulants: Secondary | ICD-10-CM | POA: Diagnosis not present

## 2011-09-30 DIAGNOSIS — Z954 Presence of other heart-valve replacement: Secondary | ICD-10-CM | POA: Diagnosis not present

## 2011-10-21 DIAGNOSIS — Z954 Presence of other heart-valve replacement: Secondary | ICD-10-CM | POA: Diagnosis not present

## 2011-10-21 DIAGNOSIS — E7800 Pure hypercholesterolemia, unspecified: Secondary | ICD-10-CM | POA: Diagnosis not present

## 2011-10-21 DIAGNOSIS — Z79899 Other long term (current) drug therapy: Secondary | ICD-10-CM | POA: Diagnosis not present

## 2011-10-21 DIAGNOSIS — Z7901 Long term (current) use of anticoagulants: Secondary | ICD-10-CM | POA: Diagnosis not present

## 2011-10-22 DIAGNOSIS — H35379 Puckering of macula, unspecified eye: Secondary | ICD-10-CM | POA: Diagnosis not present

## 2011-10-22 DIAGNOSIS — H35439 Paving stone degeneration of retina, unspecified eye: Secondary | ICD-10-CM | POA: Diagnosis not present

## 2011-10-22 DIAGNOSIS — H353290 Exudative age-related macular degeneration, unspecified eye, stage unspecified: Secondary | ICD-10-CM | POA: Diagnosis not present

## 2011-10-22 DIAGNOSIS — H353190 Nonexudative age-related macular degeneration, unspecified eye, stage unspecified: Secondary | ICD-10-CM | POA: Diagnosis not present

## 2011-11-11 DIAGNOSIS — Z7901 Long term (current) use of anticoagulants: Secondary | ICD-10-CM | POA: Diagnosis not present

## 2011-11-11 DIAGNOSIS — Z954 Presence of other heart-valve replacement: Secondary | ICD-10-CM | POA: Diagnosis not present

## 2011-11-24 DIAGNOSIS — I359 Nonrheumatic aortic valve disorder, unspecified: Secondary | ICD-10-CM | POA: Diagnosis not present

## 2011-11-24 DIAGNOSIS — R0602 Shortness of breath: Secondary | ICD-10-CM | POA: Diagnosis not present

## 2011-11-24 DIAGNOSIS — I10 Essential (primary) hypertension: Secondary | ICD-10-CM | POA: Diagnosis not present

## 2011-11-24 DIAGNOSIS — I2510 Atherosclerotic heart disease of native coronary artery without angina pectoris: Secondary | ICD-10-CM | POA: Diagnosis not present

## 2011-12-03 DIAGNOSIS — Z7901 Long term (current) use of anticoagulants: Secondary | ICD-10-CM | POA: Diagnosis not present

## 2011-12-03 DIAGNOSIS — Z954 Presence of other heart-valve replacement: Secondary | ICD-10-CM | POA: Diagnosis not present

## 2011-12-05 DIAGNOSIS — E7800 Pure hypercholesterolemia, unspecified: Secondary | ICD-10-CM | POA: Diagnosis not present

## 2011-12-05 DIAGNOSIS — I779 Disorder of arteries and arterioles, unspecified: Secondary | ICD-10-CM | POA: Diagnosis not present

## 2011-12-05 DIAGNOSIS — I25810 Atherosclerosis of coronary artery bypass graft(s) without angina pectoris: Secondary | ICD-10-CM | POA: Diagnosis not present

## 2011-12-05 DIAGNOSIS — E119 Type 2 diabetes mellitus without complications: Secondary | ICD-10-CM | POA: Diagnosis not present

## 2011-12-05 DIAGNOSIS — Z23 Encounter for immunization: Secondary | ICD-10-CM | POA: Diagnosis not present

## 2011-12-05 DIAGNOSIS — I359 Nonrheumatic aortic valve disorder, unspecified: Secondary | ICD-10-CM | POA: Diagnosis not present

## 2011-12-05 DIAGNOSIS — D649 Anemia, unspecified: Secondary | ICD-10-CM | POA: Diagnosis not present

## 2011-12-05 DIAGNOSIS — I10 Essential (primary) hypertension: Secondary | ICD-10-CM | POA: Diagnosis not present

## 2011-12-22 ENCOUNTER — Other Ambulatory Visit: Payer: Self-pay | Admitting: Family Medicine

## 2011-12-22 DIAGNOSIS — Z1231 Encounter for screening mammogram for malignant neoplasm of breast: Secondary | ICD-10-CM

## 2011-12-23 DIAGNOSIS — Z7901 Long term (current) use of anticoagulants: Secondary | ICD-10-CM | POA: Diagnosis not present

## 2011-12-23 DIAGNOSIS — Z954 Presence of other heart-valve replacement: Secondary | ICD-10-CM | POA: Diagnosis not present

## 2012-01-02 DIAGNOSIS — Z954 Presence of other heart-valve replacement: Secondary | ICD-10-CM | POA: Diagnosis not present

## 2012-01-02 DIAGNOSIS — Z7901 Long term (current) use of anticoagulants: Secondary | ICD-10-CM | POA: Diagnosis not present

## 2012-01-20 ENCOUNTER — Ambulatory Visit
Admission: RE | Admit: 2012-01-20 | Discharge: 2012-01-20 | Disposition: A | Discharge status: Home / Self Care | Payer: Medicare Other | Source: Ambulatory Visit | Attending: Family Medicine | Admitting: Family Medicine

## 2012-01-20 DIAGNOSIS — Z1231 Encounter for screening mammogram for malignant neoplasm of breast: Secondary | ICD-10-CM | POA: Diagnosis not present

## 2012-01-23 DIAGNOSIS — Z7901 Long term (current) use of anticoagulants: Secondary | ICD-10-CM | POA: Diagnosis not present

## 2012-01-23 DIAGNOSIS — Z954 Presence of other heart-valve replacement: Secondary | ICD-10-CM | POA: Diagnosis not present

## 2012-01-23 DIAGNOSIS — I359 Nonrheumatic aortic valve disorder, unspecified: Secondary | ICD-10-CM | POA: Diagnosis not present

## 2012-01-23 DIAGNOSIS — E119 Type 2 diabetes mellitus without complications: Secondary | ICD-10-CM | POA: Diagnosis not present

## 2012-01-23 DIAGNOSIS — Z79899 Other long term (current) drug therapy: Secondary | ICD-10-CM | POA: Diagnosis not present

## 2012-01-23 DIAGNOSIS — E1129 Type 2 diabetes mellitus with other diabetic kidney complication: Secondary | ICD-10-CM | POA: Diagnosis not present

## 2012-01-23 DIAGNOSIS — I10 Essential (primary) hypertension: Secondary | ICD-10-CM | POA: Diagnosis not present

## 2012-01-23 DIAGNOSIS — E7800 Pure hypercholesterolemia, unspecified: Secondary | ICD-10-CM | POA: Diagnosis not present

## 2012-02-10 DIAGNOSIS — Z7901 Long term (current) use of anticoagulants: Secondary | ICD-10-CM | POA: Diagnosis not present

## 2012-02-10 DIAGNOSIS — Z954 Presence of other heart-valve replacement: Secondary | ICD-10-CM | POA: Diagnosis not present

## 2012-03-03 DIAGNOSIS — Z954 Presence of other heart-valve replacement: Secondary | ICD-10-CM | POA: Diagnosis not present

## 2012-03-03 DIAGNOSIS — Z7901 Long term (current) use of anticoagulants: Secondary | ICD-10-CM | POA: Diagnosis not present

## 2012-03-10 DIAGNOSIS — R0989 Other specified symptoms and signs involving the circulatory and respiratory systems: Secondary | ICD-10-CM | POA: Diagnosis not present

## 2012-03-24 DIAGNOSIS — Z954 Presence of other heart-valve replacement: Secondary | ICD-10-CM | POA: Diagnosis not present

## 2012-03-24 DIAGNOSIS — Z7901 Long term (current) use of anticoagulants: Secondary | ICD-10-CM | POA: Diagnosis not present

## 2012-04-08 DIAGNOSIS — Z7901 Long term (current) use of anticoagulants: Secondary | ICD-10-CM | POA: Diagnosis not present

## 2012-04-08 DIAGNOSIS — Z954 Presence of other heart-valve replacement: Secondary | ICD-10-CM | POA: Diagnosis not present

## 2012-04-10 ENCOUNTER — Emergency Department (HOSPITAL_COMMUNITY)
Admission: EM | Admit: 2012-04-10 | Discharge: 2012-04-10 | Disposition: A | Discharge status: Home / Self Care | Payer: Medicare Other | Attending: Emergency Medicine | Admitting: Emergency Medicine

## 2012-04-10 ENCOUNTER — Encounter (HOSPITAL_COMMUNITY): Payer: Self-pay | Admitting: Physical Medicine and Rehabilitation

## 2012-04-10 ENCOUNTER — Emergency Department (HOSPITAL_COMMUNITY): Payer: Medicare Other

## 2012-04-10 DIAGNOSIS — S93609A Unspecified sprain of unspecified foot, initial encounter: Secondary | ICD-10-CM | POA: Insufficient documentation

## 2012-04-10 DIAGNOSIS — M7730 Calcaneal spur, unspecified foot: Secondary | ICD-10-CM | POA: Diagnosis not present

## 2012-04-10 DIAGNOSIS — I509 Heart failure, unspecified: Secondary | ICD-10-CM | POA: Diagnosis not present

## 2012-04-10 DIAGNOSIS — E119 Type 2 diabetes mellitus without complications: Secondary | ICD-10-CM | POA: Diagnosis not present

## 2012-04-10 DIAGNOSIS — E785 Hyperlipidemia, unspecified: Secondary | ICD-10-CM | POA: Insufficient documentation

## 2012-04-10 DIAGNOSIS — S93409A Sprain of unspecified ligament of unspecified ankle, initial encounter: Secondary | ICD-10-CM | POA: Diagnosis not present

## 2012-04-10 DIAGNOSIS — Z951 Presence of aortocoronary bypass graft: Secondary | ICD-10-CM | POA: Insufficient documentation

## 2012-04-10 DIAGNOSIS — I10 Essential (primary) hypertension: Secondary | ICD-10-CM | POA: Insufficient documentation

## 2012-04-10 DIAGNOSIS — Z79899 Other long term (current) drug therapy: Secondary | ICD-10-CM | POA: Diagnosis not present

## 2012-04-10 DIAGNOSIS — M79609 Pain in unspecified limb: Secondary | ICD-10-CM | POA: Diagnosis not present

## 2012-04-10 DIAGNOSIS — M25579 Pain in unspecified ankle and joints of unspecified foot: Secondary | ICD-10-CM | POA: Diagnosis not present

## 2012-04-10 DIAGNOSIS — X58XXXA Exposure to other specified factors, initial encounter: Secondary | ICD-10-CM | POA: Insufficient documentation

## 2012-04-10 DIAGNOSIS — Z7901 Long term (current) use of anticoagulants: Secondary | ICD-10-CM | POA: Insufficient documentation

## 2012-04-10 HISTORY — DX: Heart failure, unspecified: I50.9

## 2012-04-10 HISTORY — DX: Hyperlipidemia, unspecified: E78.5

## 2012-04-10 HISTORY — DX: Essential (primary) hypertension: I10

## 2012-04-10 MED ORDER — HYDROCODONE-ACETAMINOPHEN 5-325 MG PO TABS: 1.0000 | ORAL_TABLET | Freq: Four times a day (QID) | ORAL | Status: AC | PRN

## 2012-04-10 MED ORDER — HYDROCODONE-ACETAMINOPHEN 5-325 MG PO TABS: 1.0000 | ORAL_TABLET | Freq: Four times a day (QID) | ORAL | Status: DC | PRN

## 2012-04-10 NOTE — Progress Notes (Signed)
Orthopedic Tech Progress Note Patient Details:  [name] 05/25/1933 [id number]  Ortho Devices Type of Ortho Device: ASO Ortho Device/Splint Location: r LE Ortho Device/Splint Interventions: Application   [name] T 04/10/2012, 12:32 PM

## 2012-04-10 NOTE — ED Notes (Signed)
Pt presents to department for evaluation of R foot swelling and pain. Pt states she "turned" her foot the wrong way a few days ago. Now states increased pain and swelling. CMS intact. 8/10 pain, increases with walking. No other injuries noted. She is alert and oriented x4. No signs of acute distress noted.

## 2012-04-10 NOTE — ED Provider Notes (Signed)
History  Scribed for Mervin Kung, MD, the patient was seen in room TR07C/TR07C. This chart was scribed by Truddie Coco. The patient's care started at 11:52 AM   CSN: [id number]  Arrival date & time 04/10/12  1106   First MD Initiated Contact with Patient 04/10/12 1122      Chief Complaint  Patient presents with  . Foot Pain     The history is provided by the patient.   [name] is a 78 y.o. female who presents to the Emergency Department complaining of worsening right foot pain and swelling after losing her balance and "turning her foot" injuring the right foot three days ago.  Pt reports the pain is mainly over the medial malleolus.  Walking makes the pain worse.  She denies any other injuries.  Nothing seems to make the pain better or worse.   Past Medical History  Diagnosis Date  . Diabetes mellitus   . Hypertension   . Hyperlipemia   . CHF (congestive heart failure)     Past Surgical History  Procedure Date  . Coronary artery bypass graft     No family history on file.  History  Substance Use Topics  . Smoking status: Never Smoker   . Smokeless tobacco: Not on file  . Alcohol Use: No    OB History    Grav Para Term Preterm Abortions TAB SAB Ect Mult Living                  Review of Systems  Musculoskeletal: Positive for arthralgias (right foot pain).  All other systems reviewed and are negative.    Allergies  Penicillins  Home Medications   Current Outpatient Rx  Name Route Sig Dispense Refill  . ASPIRIN 81 MG PO TABS Oral Take 81 mg by mouth daily.    Marland Kitchen DIGOXIN 0.125 MG PO TABS Oral Take 0.125 mg by mouth daily.    Marland Kitchen EZETIMIBE-SIMVASTATIN 10-40 MG PO TABS Oral Take 1 tablet by mouth every evening.     Marland Kitchen TANDEM PLUS PO Oral Take 1 tablet by mouth 2 (two) times daily.    . OMEGA-3 FATTY ACIDS 1000 MG PO CAPS Oral Take 1 g by mouth 2 (two) times daily.    Marland Kitchen FOLIC ACID A999333 MCG PO TABS Oral Take 400 mcg by mouth daily.    . FUROSEMIDE  80 MG PO TABS Oral Take 80 mg by mouth 2 (two) times daily.    Marland Kitchen GLIMEPIRIDE 2 MG PO TABS Oral Take 2 mg by mouth daily before breakfast.    . ISOSORBIDE MONONITRATE ER 60 MG PO TB24 Oral Take 60 mg by mouth daily.    Marland Kitchen METFORMIN HCL 1000 MG PO TABS Oral Take 1,000 mg by mouth 2 (two) times daily with a meal.    . METOPROLOL TARTRATE 50 MG PO TABS Oral Take 50 mg by mouth 2 (two) times daily.    . ADULT MULTIVITAMIN W/MINERALS CH Oral Take 1 tablet by mouth daily.    Marland Kitchen NITROGLYCERIN 0.4 MG/SPRAY TL SOLN Sublingual Place 1 spray under the tongue every 5 (five) minutes as needed. For chest pain    . POTASSIUM CHLORIDE CRYS ER 10 MEQ PO TBCR Oral Take 10 mEq by mouth 2 (two) times daily.    Marland Kitchen VALSARTAN 80 MG PO TABS Oral Take 80 mg by mouth daily.    . WARFARIN SODIUM 5 MG PO TABS Oral Take 2.5-5 mg by mouth every evening. 2.5 mg (  1/2 tab) on Monday, Thursday and Saturday; 5 mg (1 tab) on Tuesday, Wednesday, Friday and Sunday    . HYDROCODONE-ACETAMINOPHEN 5-325 MG PO TABS Oral Take 1 tablet by mouth every 6 (six) hours as needed for pain. 14 tablet 0    BP 143/70  Pulse 84  Temp 97.3 F (36.3 C) (Oral)  Resp 18  SpO2 99%  Physical Exam  Nursing note and vitals reviewed. Constitutional: She is oriented to person, place, and time. She appears well-developed and well-nourished. No distress.  HENT:  Head: Normocephalic and atraumatic.  Cardiovascular: Normal rate and regular rhythm.   No murmur heard. Pulmonary/Chest: Effort normal. She has no wheezes. She has no rales.  Abdominal: Bowel sounds are normal. There is no tenderness.  Musculoskeletal:       Right foot has swelling on the medial side, cap refill 1 second at big toe of the right foot.  DP 1+.  Tenderness anterior to the medial malleolus.  No proximal tenderness to the right leg or right knee.  Sensation intact to the right foot.      Neurological: She is alert and oriented to person, place, and time.  Skin: Skin is warm and  dry. She is not diaphoretic.  Psychiatric: She has a normal mood and affect. Her behavior is normal.    ED Course  Procedures   DIAGNOSTIC STUDIES: Oxygen Saturation is 99% on room air, normal by my interpretation.    COORDINATION OF CARE:     Labs Reviewed - No data to display Dg Ankle Complete Right  04/10/2012  *RADIOLOGY REPORT*  Clinical Data: Right foot and ankle pain.  Medial pain.  RIGHT ANKLE - COMPLETE 3+ VIEW  Comparison: None.  Findings: Plantar and Achilles calcaneal spurs noted.  Vascular calcifications are present.  Os peroneus noted.  There is mild spurring of base of fifth metatarsal.  No fracture or acute bony findings.  IMPRESSION:  1.  No fracture or acute bony findings observed. 2.  Plantar calcaneal spur. 3.  Vascular calcifications.  Original Report Authenticated By: Carron Curie, M.D.   Dg Foot Complete Right  04/10/2012  *RADIOLOGY REPORT*  Clinical Data: Right foot and ankle pain.  Injury 2 days ago.  RIGHT FOOT COMPLETE - 3+ VIEW  Comparison: None.  Findings: Type 2 accessory navicular noted.  Alignment at the Lisfranc joint appears normal.  An os peroneus is present.  No fracture or acute bony findings are observed.  Plantar and Achilles calcaneal spurs noted.  IMPRESSION:  1.  Type 2 accessory navicular. 2.  Plantar and Achilles calcaneal spurs. 3.  Atherosclerosis. 4.  No acute bony findings are identified.  Original Report Authenticated By: Carron Curie, M.D.     1. Ankle sprain and strain       MDM   no evidence of fracture of the foot or ankle patient with medial tenderness just proximal to the medial malleolus also no evidence of stress fracture today but that is a possibility but previous translocation since most of her pain is there we'll treat with ASO wrap elevation followup with her record Dr. things not improve.   I personally performed the services described in this documentation, which was scribed in my presence. The recorded  information has been reviewed and considered.         Mervin Kung, MD 04/10/12 [phone]

## 2012-04-16 DIAGNOSIS — Z7901 Long term (current) use of anticoagulants: Secondary | ICD-10-CM | POA: Diagnosis not present

## 2012-04-16 DIAGNOSIS — Z954 Presence of other heart-valve replacement: Secondary | ICD-10-CM | POA: Diagnosis not present

## 2012-04-18 DIAGNOSIS — R300 Dysuria: Secondary | ICD-10-CM | POA: Diagnosis not present

## 2012-04-18 DIAGNOSIS — N3000 Acute cystitis without hematuria: Secondary | ICD-10-CM | POA: Diagnosis not present

## 2012-04-30 DIAGNOSIS — Z7901 Long term (current) use of anticoagulants: Secondary | ICD-10-CM | POA: Diagnosis not present

## 2012-04-30 DIAGNOSIS — Z954 Presence of other heart-valve replacement: Secondary | ICD-10-CM | POA: Diagnosis not present

## 2012-05-05 DIAGNOSIS — M79609 Pain in unspecified limb: Secondary | ICD-10-CM | POA: Diagnosis not present

## 2012-05-18 DIAGNOSIS — Z7901 Long term (current) use of anticoagulants: Secondary | ICD-10-CM | POA: Diagnosis not present

## 2012-05-18 DIAGNOSIS — Z954 Presence of other heart-valve replacement: Secondary | ICD-10-CM | POA: Diagnosis not present

## 2012-06-08 DIAGNOSIS — Z23 Encounter for immunization: Secondary | ICD-10-CM | POA: Diagnosis not present

## 2012-06-08 DIAGNOSIS — Z954 Presence of other heart-valve replacement: Secondary | ICD-10-CM | POA: Diagnosis not present

## 2012-06-08 DIAGNOSIS — Z7901 Long term (current) use of anticoagulants: Secondary | ICD-10-CM | POA: Diagnosis not present

## 2012-06-08 DIAGNOSIS — E1129 Type 2 diabetes mellitus with other diabetic kidney complication: Secondary | ICD-10-CM | POA: Diagnosis not present

## 2012-06-15 DIAGNOSIS — M25579 Pain in unspecified ankle and joints of unspecified foot: Secondary | ICD-10-CM | POA: Diagnosis not present

## 2012-06-29 DIAGNOSIS — Z7901 Long term (current) use of anticoagulants: Secondary | ICD-10-CM | POA: Diagnosis not present

## 2012-06-29 DIAGNOSIS — Z954 Presence of other heart-valve replacement: Secondary | ICD-10-CM | POA: Diagnosis not present

## 2012-07-01 DIAGNOSIS — N390 Urinary tract infection, site not specified: Secondary | ICD-10-CM | POA: Diagnosis not present

## 2012-07-01 DIAGNOSIS — N3000 Acute cystitis without hematuria: Secondary | ICD-10-CM | POA: Diagnosis not present

## 2012-07-20 DIAGNOSIS — Z7901 Long term (current) use of anticoagulants: Secondary | ICD-10-CM | POA: Diagnosis not present

## 2012-07-20 DIAGNOSIS — Z954 Presence of other heart-valve replacement: Secondary | ICD-10-CM | POA: Diagnosis not present

## 2012-07-26 DIAGNOSIS — E7800 Pure hypercholesterolemia, unspecified: Secondary | ICD-10-CM | POA: Diagnosis not present

## 2012-07-26 DIAGNOSIS — Z79899 Other long term (current) drug therapy: Secondary | ICD-10-CM | POA: Diagnosis not present

## 2012-08-10 DIAGNOSIS — Z7901 Long term (current) use of anticoagulants: Secondary | ICD-10-CM | POA: Diagnosis not present

## 2012-08-10 DIAGNOSIS — Z954 Presence of other heart-valve replacement: Secondary | ICD-10-CM | POA: Diagnosis not present

## 2012-08-31 DIAGNOSIS — Z7901 Long term (current) use of anticoagulants: Secondary | ICD-10-CM | POA: Diagnosis not present

## 2012-08-31 DIAGNOSIS — Z954 Presence of other heart-valve replacement: Secondary | ICD-10-CM | POA: Diagnosis not present

## 2012-09-10 DIAGNOSIS — R0989 Other specified symptoms and signs involving the circulatory and respiratory systems: Secondary | ICD-10-CM | POA: Diagnosis not present

## 2012-09-21 DIAGNOSIS — Z7901 Long term (current) use of anticoagulants: Secondary | ICD-10-CM | POA: Diagnosis not present

## 2012-09-21 DIAGNOSIS — Z954 Presence of other heart-valve replacement: Secondary | ICD-10-CM | POA: Diagnosis not present

## 2012-10-12 DIAGNOSIS — Z7901 Long term (current) use of anticoagulants: Secondary | ICD-10-CM | POA: Diagnosis not present

## 2012-10-12 DIAGNOSIS — Z954 Presence of other heart-valve replacement: Secondary | ICD-10-CM | POA: Diagnosis not present

## 2012-11-02 DIAGNOSIS — Z954 Presence of other heart-valve replacement: Secondary | ICD-10-CM | POA: Diagnosis not present

## 2012-11-02 DIAGNOSIS — Z7901 Long term (current) use of anticoagulants: Secondary | ICD-10-CM | POA: Diagnosis not present

## 2012-11-23 DIAGNOSIS — I359 Nonrheumatic aortic valve disorder, unspecified: Secondary | ICD-10-CM | POA: Diagnosis not present

## 2012-11-23 DIAGNOSIS — I4891 Unspecified atrial fibrillation: Secondary | ICD-10-CM | POA: Diagnosis not present

## 2012-11-23 DIAGNOSIS — I6529 Occlusion and stenosis of unspecified carotid artery: Secondary | ICD-10-CM | POA: Diagnosis not present

## 2012-11-23 DIAGNOSIS — I25810 Atherosclerosis of coronary artery bypass graft(s) without angina pectoris: Secondary | ICD-10-CM | POA: Diagnosis not present

## 2012-11-23 DIAGNOSIS — Z7901 Long term (current) use of anticoagulants: Secondary | ICD-10-CM | POA: Diagnosis not present

## 2012-11-23 DIAGNOSIS — Z954 Presence of other heart-valve replacement: Secondary | ICD-10-CM | POA: Diagnosis not present

## 2012-12-06 DIAGNOSIS — E1129 Type 2 diabetes mellitus with other diabetic kidney complication: Secondary | ICD-10-CM | POA: Diagnosis not present

## 2012-12-06 DIAGNOSIS — Z0000 Encounter for general adult medical examination without abnormal findings: Secondary | ICD-10-CM | POA: Diagnosis not present

## 2012-12-06 DIAGNOSIS — I779 Disorder of arteries and arterioles, unspecified: Secondary | ICD-10-CM | POA: Diagnosis not present

## 2012-12-06 DIAGNOSIS — E7800 Pure hypercholesterolemia, unspecified: Secondary | ICD-10-CM | POA: Diagnosis not present

## 2012-12-06 DIAGNOSIS — I10 Essential (primary) hypertension: Secondary | ICD-10-CM | POA: Diagnosis not present

## 2012-12-06 DIAGNOSIS — D649 Anemia, unspecified: Secondary | ICD-10-CM | POA: Diagnosis not present

## 2012-12-06 DIAGNOSIS — I359 Nonrheumatic aortic valve disorder, unspecified: Secondary | ICD-10-CM | POA: Diagnosis not present

## 2012-12-06 DIAGNOSIS — I25810 Atherosclerosis of coronary artery bypass graft(s) without angina pectoris: Secondary | ICD-10-CM | POA: Diagnosis not present

## 2012-12-08 DIAGNOSIS — Z954 Presence of other heart-valve replacement: Secondary | ICD-10-CM | POA: Diagnosis not present

## 2012-12-08 DIAGNOSIS — Z7901 Long term (current) use of anticoagulants: Secondary | ICD-10-CM | POA: Diagnosis not present

## 2012-12-21 DIAGNOSIS — Z7901 Long term (current) use of anticoagulants: Secondary | ICD-10-CM | POA: Diagnosis not present

## 2012-12-21 DIAGNOSIS — Z954 Presence of other heart-valve replacement: Secondary | ICD-10-CM | POA: Diagnosis not present

## 2012-12-22 DIAGNOSIS — H353290 Exudative age-related macular degeneration, unspecified eye, stage unspecified: Secondary | ICD-10-CM | POA: Diagnosis not present

## 2012-12-22 DIAGNOSIS — H353190 Nonexudative age-related macular degeneration, unspecified eye, stage unspecified: Secondary | ICD-10-CM | POA: Diagnosis not present

## 2012-12-22 DIAGNOSIS — H35059 Retinal neovascularization, unspecified, unspecified eye: Secondary | ICD-10-CM | POA: Diagnosis not present

## 2012-12-22 DIAGNOSIS — E11329 Type 2 diabetes mellitus with mild nonproliferative diabetic retinopathy without macular edema: Secondary | ICD-10-CM | POA: Diagnosis not present

## 2013-01-05 DIAGNOSIS — H353290 Exudative age-related macular degeneration, unspecified eye, stage unspecified: Secondary | ICD-10-CM | POA: Diagnosis not present

## 2013-01-11 DIAGNOSIS — Z954 Presence of other heart-valve replacement: Secondary | ICD-10-CM | POA: Diagnosis not present

## 2013-01-11 DIAGNOSIS — Z7901 Long term (current) use of anticoagulants: Secondary | ICD-10-CM | POA: Diagnosis not present

## 2013-01-19 DIAGNOSIS — R0602 Shortness of breath: Secondary | ICD-10-CM | POA: Diagnosis not present

## 2013-01-19 DIAGNOSIS — K921 Melena: Secondary | ICD-10-CM | POA: Diagnosis not present

## 2013-01-19 DIAGNOSIS — R5381 Other malaise: Secondary | ICD-10-CM | POA: Diagnosis not present

## 2013-01-19 DIAGNOSIS — Z1211 Encounter for screening for malignant neoplasm of colon: Secondary | ICD-10-CM | POA: Diagnosis not present

## 2013-01-19 DIAGNOSIS — Z7901 Long term (current) use of anticoagulants: Secondary | ICD-10-CM | POA: Diagnosis not present

## 2013-01-19 DIAGNOSIS — R5383 Other fatigue: Secondary | ICD-10-CM | POA: Diagnosis not present

## 2013-01-21 DIAGNOSIS — D649 Anemia, unspecified: Secondary | ICD-10-CM | POA: Diagnosis not present

## 2013-01-21 DIAGNOSIS — R5381 Other malaise: Secondary | ICD-10-CM | POA: Diagnosis not present

## 2013-01-21 DIAGNOSIS — R5383 Other fatigue: Secondary | ICD-10-CM | POA: Diagnosis not present

## 2013-01-25 DIAGNOSIS — D649 Anemia, unspecified: Secondary | ICD-10-CM | POA: Diagnosis not present

## 2013-01-27 ENCOUNTER — Other Ambulatory Visit (HOSPITAL_COMMUNITY): Payer: Self-pay | Admitting: *Deleted

## 2013-01-27 DIAGNOSIS — Z7901 Long term (current) use of anticoagulants: Secondary | ICD-10-CM | POA: Diagnosis not present

## 2013-01-27 DIAGNOSIS — Z954 Presence of other heart-valve replacement: Secondary | ICD-10-CM | POA: Diagnosis not present

## 2013-01-28 ENCOUNTER — Encounter (HOSPITAL_COMMUNITY)
Admission: RE | Admit: 2013-01-28 | Discharge: 2013-01-28 | Disposition: A | Discharge status: Home / Self Care | Payer: Medicare Other | Source: Ambulatory Visit | Attending: Family Medicine | Admitting: Family Medicine

## 2013-01-28 DIAGNOSIS — D649 Anemia, unspecified: Secondary | ICD-10-CM | POA: Diagnosis not present

## 2013-01-28 LAB — PREPARE RBC (CROSSMATCH)

## 2013-01-28 MED ORDER — SODIUM CHLORIDE 0.9 % IV SOLN: INTRAVENOUS | Status: DC

## 2013-01-29 LAB — TYPE AND SCREEN
ABO/RH(D): O NEG
Antibody Screen: NEGATIVE

## 2013-02-02 DIAGNOSIS — H353290 Exudative age-related macular degeneration, unspecified eye, stage unspecified: Secondary | ICD-10-CM | POA: Diagnosis not present

## 2013-02-02 DIAGNOSIS — E7800 Pure hypercholesterolemia, unspecified: Secondary | ICD-10-CM | POA: Diagnosis not present

## 2013-02-02 DIAGNOSIS — E11329 Type 2 diabetes mellitus with mild nonproliferative diabetic retinopathy without macular edema: Secondary | ICD-10-CM | POA: Diagnosis not present

## 2013-02-02 DIAGNOSIS — H35059 Retinal neovascularization, unspecified, unspecified eye: Secondary | ICD-10-CM | POA: Diagnosis not present

## 2013-02-02 DIAGNOSIS — Z79899 Other long term (current) drug therapy: Secondary | ICD-10-CM | POA: Diagnosis not present

## 2013-02-02 DIAGNOSIS — H353190 Nonexudative age-related macular degeneration, unspecified eye, stage unspecified: Secondary | ICD-10-CM | POA: Diagnosis not present

## 2013-02-08 DIAGNOSIS — Z954 Presence of other heart-valve replacement: Secondary | ICD-10-CM | POA: Diagnosis not present

## 2013-02-08 DIAGNOSIS — D649 Anemia, unspecified: Secondary | ICD-10-CM | POA: Diagnosis not present

## 2013-02-08 DIAGNOSIS — Z7901 Long term (current) use of anticoagulants: Secondary | ICD-10-CM | POA: Diagnosis not present

## 2013-02-10 ENCOUNTER — Encounter: Payer: Self-pay | Admitting: Internal Medicine

## 2013-02-10 ENCOUNTER — Telehealth: Payer: Self-pay | Admitting: Internal Medicine

## 2013-02-10 ENCOUNTER — Ambulatory Visit: Payer: Medicare Other | Admitting: Physician Assistant

## 2013-02-10 NOTE — Telephone Encounter (Signed)
Spoke with Raquel Sarna and scheduled patient to be seen on 02/11/13 at 10:00 with Tye Savoy, NP . Raquel Sarna states she has faxed [place] records.

## 2013-02-11 ENCOUNTER — Ambulatory Visit (INDEPENDENT_AMBULATORY_CARE_PROVIDER_SITE_OTHER): Payer: Medicare Other | Admitting: Nurse Practitioner

## 2013-02-11 ENCOUNTER — Inpatient Hospital Stay (HOSPITAL_COMMUNITY)
Admission: AD | Admit: 2013-02-11 | Discharge: 2013-02-15 | DRG: 378 | Disposition: A | Discharge status: Home / Self Care | Payer: Medicare Other | Source: Ambulatory Visit | Attending: Internal Medicine | Admitting: Internal Medicine

## 2013-02-11 ENCOUNTER — Encounter (HOSPITAL_COMMUNITY): Payer: Self-pay | Admitting: General Practice

## 2013-02-11 ENCOUNTER — Encounter: Payer: Self-pay | Admitting: Nurse Practitioner

## 2013-02-11 VITALS — BP 120/50 | HR 72 | Ht 60.0 in | Wt 149.1 lb

## 2013-02-11 DIAGNOSIS — D649 Anemia, unspecified: Secondary | ICD-10-CM | POA: Insufficient documentation

## 2013-02-11 DIAGNOSIS — I5022 Chronic systolic (congestive) heart failure: Secondary | ICD-10-CM | POA: Diagnosis present

## 2013-02-11 DIAGNOSIS — E785 Hyperlipidemia, unspecified: Secondary | ICD-10-CM | POA: Diagnosis present

## 2013-02-11 DIAGNOSIS — K922 Gastrointestinal hemorrhage, unspecified: Secondary | ICD-10-CM | POA: Diagnosis not present

## 2013-02-11 DIAGNOSIS — I10 Essential (primary) hypertension: Secondary | ICD-10-CM | POA: Diagnosis present

## 2013-02-11 DIAGNOSIS — Z7901 Long term (current) use of anticoagulants: Secondary | ICD-10-CM

## 2013-02-11 DIAGNOSIS — I509 Heart failure, unspecified: Secondary | ICD-10-CM | POA: Diagnosis present

## 2013-02-11 DIAGNOSIS — K2970 Gastritis, unspecified, without bleeding: Secondary | ICD-10-CM | POA: Diagnosis not present

## 2013-02-11 DIAGNOSIS — Z951 Presence of aortocoronary bypass graft: Secondary | ICD-10-CM

## 2013-02-11 DIAGNOSIS — E119 Type 2 diabetes mellitus without complications: Secondary | ICD-10-CM

## 2013-02-11 DIAGNOSIS — I2510 Atherosclerotic heart disease of native coronary artery without angina pectoris: Secondary | ICD-10-CM | POA: Diagnosis not present

## 2013-02-11 DIAGNOSIS — E1129 Type 2 diabetes mellitus with other diabetic kidney complication: Secondary | ICD-10-CM | POA: Diagnosis present

## 2013-02-11 DIAGNOSIS — K921 Melena: Secondary | ICD-10-CM | POA: Diagnosis not present

## 2013-02-11 DIAGNOSIS — D62 Acute posthemorrhagic anemia: Secondary | ICD-10-CM | POA: Diagnosis present

## 2013-02-11 DIAGNOSIS — Z954 Presence of other heart-valve replacement: Secondary | ICD-10-CM | POA: Diagnosis not present

## 2013-02-11 DIAGNOSIS — I252 Old myocardial infarction: Secondary | ICD-10-CM | POA: Diagnosis not present

## 2013-02-11 DIAGNOSIS — Z952 Presence of prosthetic heart valve: Secondary | ICD-10-CM

## 2013-02-11 HISTORY — DX: Cardiac murmur, unspecified: R01.1

## 2013-02-11 HISTORY — DX: Personal history of other medical treatment: Z92.89

## 2013-02-11 HISTORY — DX: Old myocardial infarction: I25.2

## 2013-02-11 HISTORY — DX: Gastrointestinal hemorrhage, unspecified: K92.2

## 2013-02-11 HISTORY — DX: Angina pectoris, unspecified: I20.9

## 2013-02-11 HISTORY — DX: Type 2 diabetes mellitus without complications: E11.9

## 2013-02-11 HISTORY — DX: Unspecified osteoarthritis, unspecified site: M19.90

## 2013-02-11 HISTORY — DX: Anemia, unspecified: D64.9

## 2013-02-11 LAB — CBC
Hemoglobin: 8.6 g/dL — ABNORMAL LOW (ref 12.0–15.0)
MCH: 31.3 pg (ref 26.0–34.0)
MCHC: 34 g/dL (ref 30.0–36.0)
Platelets: 217 10*3/uL (ref 150–400)
RDW: 14.8 % (ref 11.5–15.5)

## 2013-02-11 LAB — COMPREHENSIVE METABOLIC PANEL
ALT: 36 U/L — ABNORMAL HIGH (ref 0–35)
AST: 31 U/L (ref 0–37)
Albumin: 3.5 g/dL (ref 3.5–5.2)
Alkaline Phosphatase: 48 U/L (ref 39–117)
Calcium: 9.2 mg/dL (ref 8.4–10.5)
GFR calc Af Amer: 48 mL/min — ABNORMAL LOW (ref >90)
Glucose, Bld: 255 mg/dL — ABNORMAL HIGH (ref 70–99)
Potassium: 4.6 mEq/L (ref 3.5–5.1)
Sodium: 137 mEq/L (ref 135–145)
Total Protein: 6.4 g/dL (ref 6.0–8.3)

## 2013-02-11 LAB — PREPARE RBC (CROSSMATCH)

## 2013-02-11 LAB — GLUCOSE, CAPILLARY
Glucose-Capillary: 271 mg/dL — ABNORMAL HIGH (ref 70–99)
Glucose-Capillary: 129 mg/dL — ABNORMAL HIGH (ref 70–99)
Glucose-Capillary: 178 mg/dL — ABNORMAL HIGH (ref 70–99)

## 2013-02-11 MED ORDER — HEPARIN (PORCINE) IN NACL 100-0.45 UNIT/ML-% IJ SOLN
850.0000 [IU]/h | INTRAMUSCULAR | Status: DC
  Administered 2013-02-11: 800 [IU]/h via INTRAVENOUS
  Administered 2013-02-12: 850 [IU]/h via INTRAVENOUS
  Filled 2013-02-11 (×2): qty 250

## 2013-02-11 MED ORDER — INSULIN ASPART 100 UNIT/ML ~~LOC~~ SOLN
0.0000 [IU] | Freq: Three times a day (TID) | SUBCUTANEOUS | Status: DC
  Administered 2013-02-11: 1 [IU] via SUBCUTANEOUS
  Administered 2013-02-12: 2 [IU] via SUBCUTANEOUS

## 2013-02-11 MED ORDER — METOPROLOL TARTRATE 50 MG PO TABS
50.0000 mg | ORAL_TABLET | Freq: Two times a day (BID) | ORAL | Status: DC
  Administered 2013-02-11 – 2013-02-12 (×3): 50 mg via ORAL
  Filled 2013-02-11 (×6): qty 1

## 2013-02-11 MED ORDER — EZETIMIBE-SIMVASTATIN 10-40 MG PO TABS
1.0000 | ORAL_TABLET | Freq: Every evening | ORAL | Status: DC
  Administered 2013-02-11 – 2013-02-12 (×2): 1 via ORAL
  Filled 2013-02-11 (×3): qty 1

## 2013-02-11 MED ORDER — DIGOXIN 125 MCG PO TABS
0.1250 mg | ORAL_TABLET | Freq: Every day | ORAL | Status: DC
  Administered 2013-02-12: 0.125 mg via ORAL
  Filled 2013-02-11 (×3): qty 1

## 2013-02-11 MED ORDER — ADULT MULTIVITAMIN W/MINERALS CH
1.0000 | ORAL_TABLET | Freq: Every day | ORAL | Status: DC
  Administered 2013-02-12: 1 via ORAL
  Filled 2013-02-11 (×3): qty 1

## 2013-02-11 MED ORDER — ACETAMINOPHEN 325 MG PO TABS: 650.0000 mg | ORAL_TABLET | Freq: Four times a day (QID) | ORAL | Status: DC | PRN

## 2013-02-11 MED ORDER — SODIUM CHLORIDE 0.9 % IV SOLN
INTRAVENOUS | Status: DC
  Administered 2013-02-11: 20 mL/h via INTRAVENOUS

## 2013-02-11 MED ORDER — PANTOPRAZOLE SODIUM 40 MG IV SOLR
40.0000 mg | Freq: Two times a day (BID) | INTRAVENOUS | Status: DC
  Administered 2013-02-11 – 2013-02-12 (×4): 40 mg via INTRAVENOUS
  Filled 2013-02-11 (×6): qty 40

## 2013-02-11 MED ORDER — NITROGLYCERIN 0.4 MG/SPRAY TL SOLN
1.0000 | Status: DC | PRN
  Filled 2013-02-11: qty 4.9

## 2013-02-11 MED ORDER — POTASSIUM CHLORIDE CRYS ER 10 MEQ PO TBCR
10.0000 meq | EXTENDED_RELEASE_TABLET | Freq: Two times a day (BID) | ORAL | Status: DC
  Administered 2013-02-11 – 2013-02-12 (×3): 10 meq via ORAL
  Filled 2013-02-11 (×6): qty 1

## 2013-02-11 MED ORDER — ONDANSETRON HCL 4 MG/2ML IJ SOLN: 4.0000 mg | Freq: Four times a day (QID) | INTRAMUSCULAR | Status: DC | PRN

## 2013-02-11 NOTE — Progress Notes (Signed)
Reviewed , subacute GIB , known avm's, also possibility of a diverticular bleed. We will ask [facility] to assist with Heparin transition and plan to do colon and enteroscopy with possible ERBE ablation of avm's when INR,2.0, in a Heparin window. Would stop iron supplements in preparation for bowl prep. Her physical exam today does not suggest CHF,

## 2013-02-11 NOTE — Consult Note (Signed)
Admit date: 02/11/2013 Referring Physician  Dr. Broadus John  Primary Cardiologist  Dr. Irish Lack Reason for Consultation  GI bleed, aortic valve replacement  HPI: 79 year old [name] with a history of coronary artery disease, aortic valve replacement and prior GI bleed.  She had been bleeding over the past few weeks.  She received a 1 unit transfusion at one point.  She began to notice some chest tightness, fatigue and shortness of breath, particularly with exertion.  Her hemoglobin continued to drop and she was referred to the hospital.  While at rest, she feels okay.  We are asked to consult because of her aortic valve replacement and management of anticoagulation.  Her last INR checked in the office was 3.1.  Her Coumadin dose was reduced and now her INR is below 2.  She was started on heparin.     PMH:   Past Medical History  Diagnosis Date  . Hypertension   . Hyperlipemia   . CHF (congestive heart failure)   . Arteriovenous malformation of gastrointestinal tract   . Heart murmur   . Anginal pain   . Old MI (myocardial infarction)     "a coulple /dr in 02/2008" (02/11/2013)  . Type II diabetes mellitus   . Anemia   . History of blood transfusion     "today and a couple times over the years" (02/11/2013)  . Arthritis     "fingers mostly" (02/11/2013)  . Chronic lower GI bleeding     "today; last time was ~ 8 yr ago; used to have them often before that too" (02/11/2013)     PSH:   Past Surgical History  Procedure Laterality Date  . Appendectomy  1953  . Cardiac catheterization  ~1990  . Coronary angioplasty with stent placement  2009+    "3 at least; put in 1 stent at a time" (02/11/2013)  . Aortic valve replacement  ~ 1993  . Coronary artery bypass graft  1990    (CABG X4" (02/11/2013)  . Cardiac valve replacement  1994    St. Jude/notes 10/29/2003 (02/11/2013)  . Exploratory laparotomy  02/24/2008    which revealed a retroperitoneal hematoma and bleeding from the right external iliac  artery/notes 03/02/2008 (02/11/2013)   . Dilation and curettage of uterus  1960's    'after a miscarriage" (02/11/2013)    Allergies:  Penicillins Prior to Admit Meds:   Prescriptions prior to admission  Medication Sig Dispense Refill  . aspirin 81 MG tablet Take 81 mg by mouth daily.      . digoxin (LANOXIN) 0.125 MG tablet Take 0.125 mg by mouth daily.      Marland Kitchen ezetimibe-simvastatin (VYTORIN) 10-40 MG per tablet Take 1 tablet by mouth every evening.       Marland Kitchen FeFum-FePo-FA-B Cmp-C-Zn-Mn-Cu (TANDEM PLUS PO) Take 1 tablet by mouth 2 (two) times daily.      . folic acid (FOLVITE) A999333 MCG tablet Take 400 mcg by mouth every evening.       . furosemide (LASIX) 80 MG tablet Take 80 mg by mouth 2 (two) times daily.      Marland Kitchen glimepiride (AMARYL) 2 MG tablet Take 2 mg by mouth daily before breakfast.      . isosorbide mononitrate (IMDUR) 60 MG 24 hr tablet Take 60 mg by mouth daily.      . metoprolol (LOPRESSOR) 50 MG tablet Take 50 mg by mouth 2 (two) times daily.      . Multiple Vitamin (MULTIVITAMIN WITH MINERALS) TABS Take 1  tablet by mouth daily.      . nitroGLYCERIN (NITROLINGUAL) 0.4 MG/SPRAY spray Place 1 spray under the tongue every 5 (five) minutes as needed. For chest pain      . Omega-3 Fatty Acids 1200 MG CAPS Take 3 capsules by mouth 2 (two) times daily.      . potassium chloride (K-DUR,KLOR-CON) 10 MEQ tablet Take 20 mEq by mouth 2 (two) times daily.       . valsartan (DIOVAN) 80 MG tablet Take 80 mg by mouth daily.      Marland Kitchen warfarin (COUMADIN) 5 MG tablet Take 2.5-5 mg by mouth daily. 2.5mg  Monday, Wednesday, Friday; 5mg  the rest of the week      . Aflibercept (EYLEA) 2 MG/0.05ML SOLN Inject 1 application into the eye every 30 (thirty) days.      Marland Kitchen tobramycin (TOBREX) 0.3 % ophthalmic solution Place 1 drop into the left eye See admin instructions. For 3 days, four times a day after eylea injection       Fam HX:    Family History  Problem Relation Age of Onset  . Heart disease Mother   .  Heart disease Father    Social HX:    History   Social History  . Marital Status: Married    Spouse Name: N/A    Number of Children: N/A  . Years of Education: N/A   Occupational History  . Not on file.   Social History Main Topics  . Smoking status: Never Smoker   . Smokeless tobacco: Never Used  . Alcohol Use: No  . Drug Use: No  . Sexually Active: Yes   Other Topics Concern  . Not on file   Social History Narrative  . No narrative on file     ROS:  All 11 ROS were addressed and are negative except what is stated in the HPI  Physical Exam: Blood pressure 142/44, pulse 82, temperature 98.3 F (36.8 C), temperature source Oral, resp. rate 17, SpO2 100.00%.    General: Well developed, well nourished, in no acute distress Head: Eyes PERRLA, No xanthomas.   Normal cephalic and atramatic  Lungs:   Clear bilaterally to auscultation and percussion. Heart:   HRRR S1 crisp  S2click Abdomen:  nondistended  Msk:   Normal strength and tone for age. Extremities:  No edema.  DP +1 Neuro: Alert and oriented X 3. Psych:  Good affect, responds appropriately    Labs:   Lab Results  Component Value Date   WBC 5.2 02/11/2013   HGB 8.6* 02/11/2013   HCT 25.3* 02/11/2013   MCV 92.0 02/11/2013   PLT 217 02/11/2013    Recent Labs Lab 02/11/13 1447  NA 137  K 4.6  CL 101  CO2 26  BUN 25*  CREATININE 1.22*  CALCIUM 9.2  PROT 6.4  BILITOT 0.6  ALKPHOS 48  ALT 36*  AST 31  GLUCOSE 255*   No results found for this basename: PTT   Lab Results  Component Value Date   INR 1.89* 02/11/2013   INR 1.37 12/12/2009   INR 1.52* 12/11/2009   Lab Results  Component Value Date   CKTOTAL 199* 04/21/2008   CKMB 9.1* 04/21/2008   TROPONINI  Value: 1.02        POSSIBLE MYOCARDIAL ISCHEMIA. SERIAL TESTING RECOMMENDED. CRITICAL VALUE NOTED.  VALUE IS CONSISTENT WITH PREVIOUSLY REPORTED AND CALLED VALUE.* 04/21/2008     No results found for this basename: CHOL   No results found  for  this basename: HDL   No results found for this basename: LDLCALC   No results found for this basename: TRIG   No results found for this basename: CHOLHDL   No results found for this basename: LDLDIRECT      Radiology:  No results found.  EKG:  Pending  ASSESSMENT: Aortic valve replacement, GI bleed, coronary artery disease  PLAN:  Her dyspnea on exertion and angina type symptoms were likely due to anemia.  Agree with the same Coumadin for now.  She will have a GI workup.  With a St. Jude valve in the aortic position, she could technically be off of anticoagulation for 7 days.  She is currently off of aspirin as well.  Hopefully, she'll tolerate the IV heparin.  Aortic valve seems to be functioning well on exam.  Crisp S2 click is heard.    Jettie Booze., MD  02/11/2013  8:37 PM

## 2013-02-11 NOTE — Patient Instructions (Addendum)
You are being admitted to [facility] hospital.  Please head over to admitting now. CC:  Gaynelle Arabian MD

## 2013-02-11 NOTE — Progress Notes (Signed)
ANTICOAGULATION CONSULT NOTE - Initial Consult  Pharmacy Consult for Heparin Indication: Mechanical AVR (St. Judes)  Allergies  Allergen Reactions  . Penicillins Rash    Patient Measurements:   Heparin Dosing Weight: 67.6 kg  Vital Signs: Temp: 97.7 F (36.5 C) (05/23 1351) Temp src: Oral (05/23 1351) BP: 122/67 mmHg (05/23 1351) Pulse Rate: 83 (05/23 1351)  Labs:  Recent Labs  02/11/13 1447  HGB 8.6*  HCT 25.3*  PLT 217  LABPROT 21.0*  INR 1.89*  CREATININE 1.22*    The CrCl is unknown because both a height and weight (above a minimum accepted value) are required for this calculation.   Medical History: Past Medical History  Diagnosis Date  . Diabetes mellitus   . Hypertension   . Hyperlipemia   . CHF (congestive heart failure)   . Arteriovenous malformation of gastrointestinal tract     Medications:  Scheduled:  . digoxin  0.125 mg Oral Daily  . ezetimibe-simvastatin  1 tablet Oral QPM  . insulin aspart  0-9 Units Subcutaneous TID WC  . metoprolol  50 mg Oral BID  . multivitamin with minerals  1 tablet Oral Daily  . pantoprazole (PROTONIX) IV  40 mg Intravenous Q12H  . potassium chloride  10 mEq Oral BID    Assessment: 79 yo female with mechanical St. Jude's AVR on chronic Coumadin.  Also with intermittent GIB, admitted for possible diverticular bleed.  Coumadin on hold in anticipation of colon and enteroscopy with possible ablation of AVMs.  Pharmacy asked to start IV heparin bridge since INR < 2.   MD requested "very low" heparin levels, will target (0.2-0.4) given possible GI bleed.  Per patient last outpatient INR 1 week ago was 3.1; no dosage adjustment made at that time other than taking half-dose that night.  PTA dosage 5 mg daily except 2.5 mg on MWF. (confirmed with patient)  Goal of Therapy:  Heparin level 0.2-0.4 Monitor platelets by anticoagulation protocol: Yes   Plan:  1. Start IV heparin gtt at 800 units/hr. 2. Check heparin level  in 6 hrs. 3. Daily heparin level and CBC. 4. Monitor for increased bleeding.  Uvaldo Rising, BCPS  Clinical Pharmacist Pager [phone]  02/11/2013 3:57 PM

## 2013-02-11 NOTE — Progress Notes (Signed)
HPI :  Patient is a 79 year old female known remotely to Dr. Olevia Perches. In 2005 patient had an EGD by [facility] for evaluation of GI bleeding and anemia in the setting of anticoagulation. Findings included antral erosions without active bleeding. Patient subsequently underwent colonoscopy, also by [facility]. Findings included a small rectal polyp and internal hemorrhoids. Of note the extent of the exam was to the cecum and the bowel prep was excellent. For unclear reasons patient became under the care of Dr. Olevia Perches in 2006. She had a small bowel video capsule study done December 2006 as part of ongoing workup for Hemoccult-positive stools and anemia. The capsule did reach the colon, multiple pinpoint arteriovenous malformations scattered through the small bowel were seen. A small amount of heme in the cecum but no obvious AVM. Patient had no more GI bleeding until recently when INR rose above 3. Patient's PCP transfused her a unit of 01/28/13. Repeat hgb on 02/08/13 was still 9.6. Patient has continued to have intermittent hematochezia. Blood is medium to dark red. She feels weak. No abdominal pain or nausea.    Past Medical History  Diagnosis Date  . Diabetes mellitus   . Hypertension   . Hyperlipemia   . CHF (congestive heart failure)   . Arteriovenous malformation of gastrointestinal tract     Family History  Problem Relation Age of Onset  . Heart disease Mother   . Heart disease Father    History  Substance Use Topics  . Smoking status: Never Smoker   . Smokeless tobacco: Never Used  . Alcohol Use: No   Current Outpatient Prescriptions  Medication Sig Dispense Refill  . aspirin 81 MG tablet Take 81 mg by mouth daily.      . digoxin (LANOXIN) 0.125 MG tablet Take 0.125 mg by mouth daily.      Marland Kitchen ezetimibe-simvastatin (VYTORIN) 10-40 MG per tablet Take 1 tablet by mouth every evening.       Marland Kitchen FeFum-FePo-FA-B Cmp-C-Zn-Mn-Cu (TANDEM PLUS PO) Take 1 tablet by mouth 2 (two) times daily.      . fish  oil-omega-3 fatty acids 1000 MG capsule Take 1 g by mouth 2 (two) times daily.      . folic acid (FOLVITE) A999333 MCG tablet Take 400 mcg by mouth daily.      . furosemide (LASIX) 80 MG tablet Take 80 mg by mouth 2 (two) times daily.      Marland Kitchen glimepiride (AMARYL) 2 MG tablet Take 2 mg by mouth daily before breakfast.      . isosorbide mononitrate (IMDUR) 60 MG 24 hr tablet Take 60 mg by mouth daily.      . metoprolol (LOPRESSOR) 50 MG tablet Take 50 mg by mouth 2 (two) times daily.      . Multiple Vitamin (MULTIVITAMIN WITH MINERALS) TABS Take 1 tablet by mouth daily.      . nitroGLYCERIN (NITROLINGUAL) 0.4 MG/SPRAY spray Place 1 spray under the tongue every 5 (five) minutes as needed. For chest pain      . NON FORMULARY eylia- shot in left eye monthly      . potassium chloride (K-DUR,KLOR-CON) 10 MEQ tablet Take 10 mEq by mouth 2 (two) times daily.      Marland Kitchen tobramycin (TOBREX) 0.3 % ophthalmic ointment 4 (four) times daily.      . valsartan (DIOVAN) 80 MG tablet Take 80 mg by mouth daily.      Marland Kitchen warfarin (COUMADIN) 5 MG tablet Take 2.5-5 mg by mouth every  evening. 2.5 mg (1/2 tab) on Monday, Thursday and Saturday; 5 mg (1 tab) on Tuesday, Wednesday, Friday and Sunday       No current facility-administered medications for this visit.   Allergies  Allergen Reactions  . Penicillins Rash   Review of Systems: All systems reviewed and negative except where noted in HPI.   Physical Exam: BP 120/50  Pulse 72  Ht 5' (1.524 m)  Wt 149 lb 2 oz (67.643 kg)  BMI 29.12 kg/m2 Constitutional: Peasant,well-developed, white female in no acute distress. HEENT: Normocephalic and atraumatic. Conjunctivae pale normal. No scleral icterus. Neck supple.  Cardiovascular: Normal rate, regular rhythm.  Pulmonary/chest: Effort normal and breath sounds normal. No wheezing, rales or rhonchi. Abdominal: Soft, nondistended, nontender. Bowel sounds active throughout. There are no masses palpable. No hepatomegaly. Rectal:   maroonish stool, heme positive Extremities: no edema Lymphadenopathy: No cervical adenopathy noted. Neurological: Alert and oriented to person place and time. Skin: Skin is warm and dry. No rashes noted. Psychiatric: Normal mood and affect. Behavior is normal.   ASSESSMENT AND PLAN: 38. 79 year old female with GI bleeding on chronic coumadin. Recent dose reduction for INR which was around 3.1. Patient has history of GI bleeding on coumadin secondary to intestinal AVMs in 2006. Suspect recurrent AVM bleeding. Hemoglobin 3 days ago was  9.6 following a unit of blood on the 9th of this month. Patient has maroon stools on rectal exam today. Patient will need endoscopic workup, possibly enteroscopy with AVM ablation as well as colonoscopy since it has been several years. Her INR will need to be normalized before AVMs can be ablated however. She will likely need heparin or Lovenox bridge. She needs admission to the hospital. Patient has multiple medical problems,, [facility] has kindly agreed to admit the patient. We will see her in the hospital and manage the gastrointestinal bleeding.  2. Prosthetic heart valve, on chronic coumadin. Recent coumadin dose reduction for elevated INR. No follow up INR available. Further management per admitting team.   3. History of erosive gastritis 2005  4. Multiple medical problems as listed in [place]

## 2013-02-11 NOTE — Progress Notes (Signed)
Pt arrived to the unit with dx internal bleed. Pt is alert and oriented x4. Ambulatory with stand by assist. Pt has no skin issues. From home with her husband. VS stable. Pt oriented to unit and staff. Will cont to monitor.

## 2013-02-11 NOTE — H&P (Signed)
[facility] History and Physical  MYCAH MEISNER [id number] DOB: 08-Feb-1933 DOA: 02/11/2013  Referring physician: Tye Savoy, NP PCP: Simona Huh, MD  Specialists: GI Dr.Brodie  Chief Complaint: direct admit, low Hb/dark stools  HPI: [name] is a 79 y.o. 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 Her Hb was being monitored by her PCP and was transfused 1 unit PRBC for Hb of 9.6 on 5/9, on repea check her Hb drifted back down to 9.6 range a week after the transfusion. She was seen at [facility] office today and referred for direct admission. She also reports progressive weakness, shortness of breath with activity and intermittent dizziness over he last week   Review of Systems: The patient denies anorexia, fever, weight loss,, vision loss, decreased hearing, hoarseness, chest pain, syncope, dyspnea on exertion, peripheral edema, balance deficits, hemoptysis, abdominal pain, melena, hematochezia, severe indigestion/heartburn, hematuria, incontinence, genital sores, muscle weakness, suspicious skin lesions, transient blindness, difficulty walking, depression, unusual weight change, abnormal bleeding, enlarged lymph nodes, angioedema, and breast masses.    Past Medical History  Diagnosis Date  . Diabetes mellitus   . Hypertension   . Hyperlipemia   . CHF (congestive heart failure)    Past Surgical History  Procedure Laterality Date  . Coronary artery bypass graft     Social History:  reports that she has never smoked. She has never used smokeless tobacco. She reports that she does not drink alcohol or use illicit drugs. Lives at home with husband, independent in ADLs  Allergies  Allergen Reactions  . Penicillins Rash    Family  History  Problem Relation Age of Onset  . Heart disease Mother   . Heart disease Father     Prior to Admission medications   Medication Sig Start Date End Date Taking? Authorizing Provider  aspirin 81 MG tablet Take 81 mg by mouth daily.    Historical Provider, MD  digoxin (LANOXIN) 0.125 MG tablet Take 0.125 mg by mouth daily.    Historical Provider, MD  ezetimibe-simvastatin (VYTORIN) 10-40 MG per tablet Take 1 tablet by mouth every evening.     Historical Provider, MD  FeFum-FePo-FA-B Cmp-C-Zn-Mn-Cu (TANDEM PLUS PO) Take 1 tablet by mouth 2 (two) times daily.    Historical Provider, MD  fish oil-omega-3 fatty acids 1000 MG capsule Take 1 g by mouth 2 (two) times daily.    Historical Provider, MD  folic acid (FOLVITE) A999333 MCG tablet Take 400 mcg by mouth daily.    Historical Provider, MD  furosemide (LASIX) 80 MG tablet Take 80 mg by mouth 2 (two) times daily.    Historical Provider, MD  glimepiride (AMARYL) 2 MG tablet Take 2 mg by mouth daily before breakfast.    Historical Provider, MD  isosorbide mononitrate (IMDUR) 60 MG 24 hr tablet Take 60 mg by mouth daily.    Historical Provider, MD  metoprolol (LOPRESSOR) 50 MG tablet Take 50 mg by mouth 2 (two) times daily.    Historical Provider, MD  Multiple Vitamin (MULTIVITAMIN WITH MINERALS) TABS Take 1 tablet by mouth daily.    Historical Provider, MD  nitroGLYCERIN (NITROLINGUAL) 0.4 MG/SPRAY spray Place 1 spray under the tongue every 5 (five) minutes as needed. For chest pain    Historical Provider, MD  NON FORMULARY eylia- shot in left eye monthly    Historical Provider, MD  potassium chloride (K-DUR,KLOR-CON) 10 MEQ tablet Take 10 mEq by mouth 2 (two) times daily.    Historical Provider, MD  tobramycin (TOBREX) 0.3 % ophthalmic ointment 4 (four) times daily.    Historical Provider, MD  valsartan (DIOVAN) 80 MG tablet Take 80 mg by mouth daily.    Historical Provider, MD  warfarin (COUMADIN) 5 MG tablet Take 2.5-5 mg by mouth every  evening. 2.5 mg (1/2 tab) on Monday, Thursday and Saturday; 5 mg (1 tab) on Tuesday, Wednesday, Friday and Sunday    Historical Provider, MD   Physical Exam: Filed Vitals:   02/11/13 1351  BP: 122/67  Pulse: 83  Temp: 97.7 F (36.5 C)  TempSrc: Oral  Resp: 16  SpO2: 99%     General:  AAOx3  HEENT: PERRLA, EOMI  Cardiovascular: [id number], metallic click noted  Respiratory: CTAB  Abdomen: soft, obese, NT, BS present  Skin: pale  Musculoskeletal: trace edema   Psychiatric:appropriate mood and affect  Neurologic: non focal  Labs on Admission:  Basic Metabolic Panel: No results found for this basename: NA, K, CL, CO2, GLUCOSE, BUN, CREATININE, CALCIUM, MG, PHOS,  in the last 168 hours Liver Function Tests: No results found for this basename: AST, ALT, ALKPHOS, BILITOT, PROT, ALBUMIN,  in the last 168 hours No results found for this basename: LIPASE, AMYLASE,  in the last 168 hours No results found for this basename: AMMONIA,  in the last 168 hours CBC: No results found for this basename: WBC, NEUTROABS, HGB, HCT, MCV, PLT,  in the last 168 hours Cardiac Enzymes: No results found for this basename: CKTOTAL, CKMB, CKMBINDEX, TROPONINI,  in the last 168 hours  BNP (last 3 results) No results found for this basename: PROBNP,  in the last 8760 hours CBG:  Recent Labs Lab 02/11/13 1345  GLUCAP 271*    Radiological Exams on Admission: No results found.  EKG: Independently reviewed. pending  Assessment/Plan Active Problems:   Acute blood loss anemia   S/P AVR (aortic valve replacement)   CAD (coronary artery disease)   DM (diabetes mellitus)   1. Acute blood loss anemia: - suspect related to AVMs and Coagulopathy from coumadin -hold coumadin and let INR drift down, once <2 bridge with IV heparin without bolus -transfuse 2 units PRBC now given symptomatic anemia -PPI -[facility] to see her in and EGD once INR in acceptable range  2.  CAD/PCI -stable -continue metoprolol, hold ASA -check EKG  3. S/p Mechanical AVR -hold coumadin as noted above and bridge with heparin when INR <2 -notified dr.Varanasi of admission, ok to hold anticoagulation for upto 7 days with this valve per dr.V  4. DM: hold oral hypoglycemics, SSI for now  DVt proph: coagulopathic  Code Status: FULL Family Communication: d/w pt and husband at bedside Disposition Plan: inpatient  Time spent: 62min  Malu Pellegrini [facility] Pager [phone]  If 7PM-7AM, please contact night-coverage [email or web address] Password [facility] 02/11/2013, 2:30 PM

## 2013-02-12 DIAGNOSIS — D62 Acute posthemorrhagic anemia: Secondary | ICD-10-CM | POA: Diagnosis not present

## 2013-02-12 DIAGNOSIS — Z7901 Long term (current) use of anticoagulants: Secondary | ICD-10-CM

## 2013-02-12 DIAGNOSIS — Z954 Presence of other heart-valve replacement: Secondary | ICD-10-CM | POA: Diagnosis not present

## 2013-02-12 DIAGNOSIS — E119 Type 2 diabetes mellitus without complications: Secondary | ICD-10-CM | POA: Diagnosis not present

## 2013-02-12 DIAGNOSIS — I2510 Atherosclerotic heart disease of native coronary artery without angina pectoris: Secondary | ICD-10-CM | POA: Diagnosis not present

## 2013-02-12 DIAGNOSIS — K922 Gastrointestinal hemorrhage, unspecified: Principal | ICD-10-CM

## 2013-02-12 LAB — BASIC METABOLIC PANEL
Calcium: 8.9 mg/dL (ref 8.4–10.5)
GFR calc Af Amer: 50 mL/min — ABNORMAL LOW (ref >90)
GFR calc non Af Amer: 43 mL/min — ABNORMAL LOW (ref >90)
Potassium: 4.4 mEq/L (ref 3.5–5.1)
Sodium: 137 mEq/L (ref 135–145)

## 2013-02-12 LAB — PROTIME-INR
INR: 1.77 — ABNORMAL HIGH (ref 0.00–1.49)
Prothrombin Time: 20 seconds — ABNORMAL HIGH (ref 11.6–15.2)

## 2013-02-12 LAB — CBC
HCT: 31.2 % — ABNORMAL LOW (ref 36.0–46.0)
Hemoglobin: 10.8 g/dL — ABNORMAL LOW (ref 12.0–15.0)
MCH: 30.9 pg (ref 26.0–34.0)
MCH: 31.6 pg (ref 26.0–34.0)
MCHC: 35.5 g/dL (ref 30.0–36.0)
MCV: 89.4 fL (ref 78.0–100.0)
Platelets: 167 10*3/uL (ref 150–400)
RBC: 3.49 MIL/uL — ABNORMAL LOW (ref 3.87–5.11)
RDW: 15.2 % (ref 11.5–15.5)
WBC: 7.3 10*3/uL (ref 4.0–10.5)

## 2013-02-12 LAB — GLUCOSE, CAPILLARY
Glucose-Capillary: 147 mg/dL — ABNORMAL HIGH (ref 70–99)
Glucose-Capillary: 163 mg/dL — ABNORMAL HIGH (ref 70–99)
Glucose-Capillary: 98 mg/dL (ref 70–99)

## 2013-02-12 LAB — HEPARIN LEVEL (UNFRACTIONATED)
Heparin Unfractionated: 0.17 IU/mL — ABNORMAL LOW (ref 0.30–0.70)
Heparin Unfractionated: 0.49 IU/mL (ref 0.30–0.70)
Heparin Unfractionated: 0.51 IU/mL (ref 0.30–0.70)

## 2013-02-12 MED ORDER — PEG-KCL-NACL-NASULF-NA ASC-C 100 G PO SOLR
0.5000 | Freq: Once | ORAL | Status: AC
  Administered 2013-02-12: 50 g via ORAL

## 2013-02-12 MED ORDER — PEG-KCL-NACL-NASULF-NA ASC-C 100 G PO SOLR: 1.0000 | Freq: Once | ORAL | Status: DC

## 2013-02-12 MED ORDER — PEG-KCL-NACL-NASULF-NA ASC-C 100 G PO SOLR
0.5000 | Freq: Once | ORAL | Status: AC
  Administered 2013-02-12: 50 g via ORAL
  Filled 2013-02-12: qty 1

## 2013-02-12 MED ORDER — HEPARIN (PORCINE) IN NACL 100-0.45 UNIT/ML-% IJ SOLN
750.0000 [IU]/h | INTRAMUSCULAR | Status: DC
  Filled 2013-02-12: qty 250

## 2013-02-12 NOTE — Progress Notes (Signed)
[facility] PROGRESS NOTE  [name] [id number] DOB: 04-Jun-1933 DOA: 02/11/2013 PCP: Simona Huh, MD  Assessment/Plan: 1. Acute blood loss anemia:  - suspect related to AVMs and Coagulopathy from coumadin - subacute gastrointestinal bleeding After admission we did hold coumadin and let INR drift down, once <2 bridge we did  IV heparin without bolus on 5/23 -transfused 2 units PRBC on 5/23  The patient was placed on-PPI from admission -[facility] following along and will perform a EGD once INR in acceptable range  2. CAD/PCI  -stable  -continue metoprolol, hold ASA   3. S/p Mechanical AVR  -hold coumadin as noted above and bridge with heparin  -Appreciate consult from dr.Varanasi on the day of admission, ok to hold anticoagulation for upto 7 days with this valve per dr.Varanasi  4. DM: hold oral hypoglycemics, SSI for now      Code Status: Full code Family Communication: Patient (indicate person spoken with, relationship, and if by phone, the number) Disposition Plan: Home   Consultants:  Gastroenterology - [facility]   Cardiology - [facility]  Procedures:    Antibiotics:  HPI/Subjective: No symptoms this morning  Objective: Filed Vitals:   02/12/13 0240 02/12/13 0333 02/12/13 0407 02/12/13 0419  BP: 126/51 139/59 153/67 143/66  Pulse: 63 64 66 65  Temp: 98.1 F (36.7 C) 97.8 F (36.6 C) 98 F (36.7 C) 98 F (36.7 C)  TempSrc: Oral Oral Oral Oral  Resp: 16 16 18 18   Height:      Weight:      SpO2: 100% 100% 99% 98%    Intake/Output Summary (Last 24 hours) at 02/12/13 0715 Last data filed at 02/12/13 0419  Gross per 24 hour  Intake 1303.38 ml  Output      0 ml  Net 1303.38 ml   Filed Weights   02/11/13 2052  Weight: 67.614 kg (149 lb 1 oz)    Exam:   General:  Alert and oriented x3  Cardiovascular: Regular rate and rhythm, 2/6 systolic murmur at the apex, metallic click in the aortic area  Respiratory: Clear to auscultation  bilaterally  Abdomen: Soft nontender bowel sounds present  Musculoskeletal: No edema   Data Reviewed: Basic Metabolic Panel:  Recent Labs Lab 02/11/13 1447 02/12/13 0530  NA 137 137  K 4.6 4.4  CL 101 104  CO2 26 22  GLUCOSE 255* 141*  BUN 25* 19  CREATININE 1.22* 1.19*  CALCIUM 9.2 8.9   Liver Function Tests:  Recent Labs Lab 02/11/13 1447  AST 31  ALT 36*  ALKPHOS 48  BILITOT 0.6  PROT 6.4  ALBUMIN 3.5   No results found for this basename: LIPASE, AMYLASE,  in the last 168 hours No results found for this basename: AMMONIA,  in the last 168 hours CBC:  Recent Labs Lab 02/11/13 1447 02/12/13 0530  WBC 5.2 7.3  HGB 8.6* 10.8*  HCT 25.3* 31.2*  MCV 92.0 89.4  PLT 217 167   Cardiac Enzymes: No results found for this basename: CKTOTAL, CKMB, CKMBINDEX, TROPONINI,  in the last 168 hours BNP (last 3 results) No results found for this basename: PROBNP,  in the last 8760 hours CBG:  Recent Labs Lab 02/11/13 1345 02/11/13 1720 02/11/13 2055  GLUCAP 271* 129* 178*    No results found for this or any previous visit (from the past 240 hour(s)).   Studies: No results found.  Scheduled Meds: . digoxin  0.125 mg Oral Daily  . ezetimibe-simvastatin  1 tablet  Oral QPM  . insulin aspart  0-9 Units Subcutaneous TID WC  . metoprolol  50 mg Oral BID  . multivitamin with minerals  1 tablet Oral Daily  . pantoprazole (PROTONIX) IV  40 mg Intravenous Q12H  . potassium chloride  10 mEq Oral BID   Continuous Infusions: . sodium chloride 20 mL/hr (02/11/13 1528)  . heparin 900 Units/hr (02/12/13 0222)    Active Problems:   Acute blood loss anemia   S/P AVR (aortic valve replacement)   CAD (coronary artery disease)   DM (diabetes mellitus)      [name]  [facility] Pager [phone]. If 7PM-7AM, please contact night-coverage at [email or web address], password [facility] 02/12/2013, 7:15 AM  LOS: 1 day

## 2013-02-12 NOTE — Progress Notes (Signed)
ANTICOAGULATION CONSULT NOTE - Follow up [place] for Heparin Indication: Mechanical AVR ([employer] Judes)  Allergies  Allergen Reactions  . Penicillins Rash    Patient Measurements: Height: 5' (152.4 cm) Weight: 149 lb 1 oz (67.614 kg) IBW/kg (Calculated) : 45.5 Heparin Dosing Weight: 67.6 kg  Vital Signs: Temp: 98.4 F (36.9 C) (05/24 0901) Temp src: Oral (05/24 0901) BP: 154/68 mmHg (05/24 0901) Pulse Rate: 78 (05/24 0901)  Labs:  Recent Labs  02/11/13 1447 02/12/13 0102 02/12/13 0530 02/12/13 0850  HGB 8.6*  --  10.8*  --   HCT 25.3*  --  31.2*  --   PLT 217  --  167  --   LABPROT 21.0*  --  20.0*  --   INR 1.89*  --  1.77*  --   HEPARINUNFRC  --  0.17*  --  0.49  CREATININE 1.22*  --  1.19*  --     Estimated Creatinine Clearance: 33.9 ml/min (by C-G formula based on Cr of 1.19).   Medical History: Past Medical History  Diagnosis Date  . Hypertension   . Hyperlipemia   . CHF (congestive heart failure)   . Arteriovenous malformation of gastrointestinal tract   . Heart murmur   . Anginal pain   . Old MI (myocardial infarction)     "a coulple /dr in 02/2008" (02/11/2013)  . Type II diabetes mellitus   . Anemia   . History of blood transfusion     "today and a couple times over the years" (02/11/2013)  . Arthritis     "fingers mostly" (02/11/2013)  . Chronic lower GI bleeding     "today; last time was ~ 8 yr ago; used to have them often before that too" (02/11/2013)    Medications:  Scheduled:  . digoxin  0.125 mg Oral Daily  . ezetimibe-simvastatin  1 tablet Oral QPM  . insulin aspart  0-9 Units Subcutaneous TID WC  . metoprolol  50 mg Oral BID  . multivitamin with minerals  1 tablet Oral Daily  . pantoprazole (PROTONIX) IV  40 mg Intravenous Q12H  . potassium chloride  10 mEq Oral BID    Assessment: 79 yo female with mechanical [employer]. Jude's AVR on chronic Coumadin.  Also with intermittent GIB, admitted for possible diverticular bleed.   Coumadin on hold in anticipation of colon and enteroscopy with possible ablation of AVMs.  Pharmacy asked to start IV heparin bridge since INR < 2.   MD requested "very low" heparin levels, will target (0.2-0.4) given possible GI bleed.  Heparin level now 0.49. Although therapeutic, it's mid-range and would prefer to keep a little lower in setting of GI bleed. No overt bleeding noted.  Goal of Therapy:  Heparin level 0.2-0.4 Monitor platelets by anticoagulation protocol: Yes   Plan:  1. Reduce heparin to 850 units/hr. 2. Check heparin level in 8 hrs. 3. Daily heparin level and CBC. 4. Monitor for increased bleeding.   Ocie Doyne, PharmD Clinical Pharmacist Pager: [phone] Phone: [phone] 02/12/2013 10:27 AM

## 2013-02-12 NOTE — Progress Notes (Signed)
ANTICOAGULATION CONSULT NOTE - Follow Up Consult  Pharmacy Consult for Heparin Indication: Mechanical AVR ([employer]. Judes)  Allergies  Allergen Reactions  . Penicillins Rash    Patient Measurements: Height: 5' (152.4 cm) Weight: 149 lb 1 oz (67.614 kg) IBW/kg (Calculated) : 45.5 Heparin Dosing Weight: 59.8kg  Vital Signs: Temp: 97.6 F (36.4 C) (05/24 1743) Temp src: Oral (05/24 1743) BP: 172/70 mmHg (05/24 1743) Pulse Rate: 73 (05/24 1743)  Labs:  Recent Labs  02/11/13 1447 02/12/13 0102 02/12/13 0530 02/12/13 0850 02/12/13 1512 02/12/13 2000  HGB 8.6*  --  10.8*  --  11.5*  --   HCT 25.3*  --  31.2*  --  32.4*  --   PLT 217  --  167  --  165  --   LABPROT 21.0*  --  20.0*  --   --   --   INR 1.89*  --  1.77*  --   --   --   HEPARINUNFRC  --  0.17*  --  0.49  --  0.51  CREATININE 1.22*  --  1.19*  --   --   --     Estimated Creatinine Clearance: 33.9 ml/min (by C-G formula based on Cr of 1.19).   Medications:  Heparin @ 850 units/hr  Assessment: 79yof continues on heparin for mechanical AVR while coumadin on hold. Heparin level is still above lower goal despite rate decrease this morning. She is still having blood in her stools per RN notes. Plan is for colonoscopy/enteroscopy tomorrow at 0900 to evaluate GI bleed/possible AVM ablation. Heparin to be turned off at 0500.  Goal of Therapy:  Heparin level 0.2-0.4 (per MD given possible GI bleed) Monitor platelets by anticoagulation protocol: Yes   Plan:  1) Decrease heparin further to 750 units/hr 2) Will not check another level as heparin is to be turned off before another level would be due 3) Will follow up after procedure tomorrow  Deboraha Sprang 02/12/2013,8:44 PM

## 2013-02-12 NOTE — Progress Notes (Signed)
Pt. Was assisted by the RN to the bathroom,and had an episode of dark,red,loose discharge,with a strong odor.keep monitoring pt. Closely and assessing her needs.

## 2013-02-12 NOTE — Progress Notes (Signed)
.  [facility] Progress Note   Subjective  S/p GIB, no stools, resting comfortably   Objective   StJudes AVR on Coumadin, on Heparin  Cross over, INR down to 1.7, will proceed with Enteroscopy/colonoscopy romorrow Vital signs in last 24 hours: Temp:  [97.5 F (36.4 C)-98.4 F (36.9 C)] 98.4 F (36.9 C) (05/24 0901) Pulse Rate:  [58-83] 78 (05/24 0901) Resp:  [16-18] 18 (05/24 0901) BP: (122-154)/(42-68) 154/68 mmHg (05/24 0901) SpO2:  [98 %-100 %] 98 % (05/24 0901) Weight:  [149 lb 1 oz (67.614 kg)] 149 lb 1 oz (67.614 kg) (05/23 2052) Last BM Date: 02/11/13 General:    white female in NAD Heart:  Regular rate and rhythm; prosthetic sound Lungs: Respirations even and unlabored, lungs CTA bilaterally Abdomen:  Soft, nontender and nondistended. Normal bowel sounds. Extremities:  Without edema. Neurologic:  Alert and oriented,  grossly normal neurologically. Psych:  Cooperative. Normal mood and affect.  Intake/Output from previous day: 05/23 0701 - 05/24 0700 In: 1419.4 [P.O.:460; I.V.:340.6; Blood:618.8] Out: -  Intake/Output this shift:    Lab Results:  Recent Labs  02/11/13 1447 02/12/13 0530  WBC 5.2 7.3  HGB 8.6* 10.8*  HCT 25.3* 31.2*  PLT 217 167   BMET  Recent Labs  02/11/13 1447 02/12/13 0530  NA 137 137  K 4.6 4.4  CL 101 104  CO2 26 22  GLUCOSE 255* 141*  BUN 25* 19  CREATININE 1.22* 1.19*  CALCIUM 9.2 8.9   LFT  Recent Labs  02/11/13 1447  PROT 6.4  ALBUMIN 3.5  AST 31  ALT 36*  ALKPHOS 48  BILITOT 0.6   PT/INR  Recent Labs  02/11/13 1447 02/12/13 0530  LABPROT 21.0* 20.0*  INR 1.89* 1.77*    Studies/Results: No results found.     Assessment:  Known avm's, chronic and ? acute GIB, hemodynamically stable, , INR down to 1.7- will proceed with enteroscopy and colonoscopy , possible avm ablation tomorrow     Plan:  Heparin window tomorrow 4.00 am, procedures at 9.00 am, then decide when to restart  Heparin  Active Problems:   Acute blood loss anemia   S/P AVR (aortic valve replacement)   CAD (coronary artery disease)   DM (diabetes mellitus)     LOS: 1 day   Delfin Edis  02/12/2013, 10:30 AM

## 2013-02-12 NOTE — Progress Notes (Addendum)
After started her prep for colonoscopy tomorrow,pt. Had a large bowel movement with big blood cloths.keep monitoring pt. Closely and assessing her needs.

## 2013-02-12 NOTE — Progress Notes (Signed)
ANTICOAGULATION CONSULT NOTE - Initial Consult  Pharmacy Consult for Heparin Indication: Mechanical AVR ([employer]. Judes)  Allergies  Allergen Reactions  . Penicillins Rash    Patient Measurements: Height: 5' (152.4 cm) Weight: 149 lb 1 oz (67.614 kg) IBW/kg (Calculated) : 45.5 Heparin Dosing Weight: 67.6 kg  Vital Signs: Temp: 98.3 F (36.8 C) (05/24 0140) Temp src: Oral (05/24 0140) BP: 135/63 mmHg (05/24 0140) Pulse Rate: 72 (05/24 0140)  Labs:  Recent Labs  02/11/13 1447 02/12/13 0102  HGB 8.6*  --   HCT 25.3*  --   PLT 217  --   LABPROT 21.0*  --   INR 1.89*  --   HEPARINUNFRC  --  0.17*  CREATININE 1.22*  --     Estimated Creatinine Clearance: 33.1 ml/min (by C-G formula based on Cr of 1.22).   Medical History: Past Medical History  Diagnosis Date  . Hypertension   . Hyperlipemia   . CHF (congestive heart failure)   . Arteriovenous malformation of gastrointestinal tract   . Heart murmur   . Anginal pain   . Old MI (myocardial infarction)     "a coulple /dr in 02/2008" (02/11/2013)  . Type II diabetes mellitus   . Anemia   . History of blood transfusion     "today and a couple times over the years" (02/11/2013)  . Arthritis     "fingers mostly" (02/11/2013)  . Chronic lower GI bleeding     "today; last time was ~ 8 yr ago; used to have them often before that too" (02/11/2013)    Medications:  Scheduled:  . digoxin  0.125 mg Oral Daily  . ezetimibe-simvastatin  1 tablet Oral QPM  . insulin aspart  0-9 Units Subcutaneous TID WC  . metoprolol  50 mg Oral BID  . multivitamin with minerals  1 tablet Oral Daily  . pantoprazole (PROTONIX) IV  40 mg Intravenous Q12H  . potassium chloride  10 mEq Oral BID    Assessment: 79 yo female with mechanical St. Jude's AVR on chronic Coumadin.  Also with intermittent GIB, admitted for possible diverticular bleed.  Coumadin on hold in anticipation of colon and enteroscopy with possible ablation of AVMs.  Pharmacy asked  to start IV heparin bridge since INR < 2.   MD requested "very low" heparin levels, will target (0.2-0.4) given possible GI bleed.  Per patient last outpatient INR 1 week ago was 3.1; no dosage adjustment made at that time other than taking half-dose that night.  PTA dosage 5 mg daily except 2.5 mg on MWF. (confirmed with patient)  1st heparin level at 800 units/hr =0.17 no overt bleeding noted  Goal of Therapy:  Heparin level 0.2-0.4 Monitor platelets by anticoagulation protocol: Yes   Plan:  Increase heparin to 900 units/hr and recheck HL at 0800 continnue to  Monitor for increased bleeding.  Uvaldo Rising, BCPS  Clinical Pharmacist Pager [phone]  02/12/2013 2:16 AM

## 2013-02-12 NOTE — Progress Notes (Signed)
Subjective:  Feels good. No CP, no stroke like symptoms, no BM  Objective:  Vital Signs in the last 24 hours: Temp:  [97.5 F (36.4 C)-98.4 F (36.9 C)] 98.4 F (36.9 C) (05/24 0901) Pulse Rate:  [58-83] 78 (05/24 0901) Resp:  [16-18] 18 (05/24 0901) BP: (122-154)/(42-68) 154/68 mmHg (05/24 0901) SpO2:  [98 %-100 %] 98 % (05/24 0901) Weight:  [67.614 kg (149 lb 1 oz)] 67.614 kg (149 lb 1 oz) (05/23 2052)  Intake/Output from previous day: 05/23 0701 - 05/24 0700 In: 1419.4 [P.O.:460; I.V.:340.6; Blood:618.8] Out: -    Physical Exam: General: Well developed, well nourished, in no acute distress. Head:  Normocephalic and atraumatic. Lungs: Clear to auscultation and percussion. Heart: Normal sharp S1 click and normal S2.  Soft S murmur RUSB,no rubs or gallops.  Abdomen: soft, non-tender, positive bowel sounds. Extremities: No clubbing or cyanosis. No edema. Neurologic: Alert and oriented x 3.    Lab Results:  Recent Labs  02/11/13 1447 02/12/13 0530  WBC 5.2 7.3  HGB 8.6* 10.8*  PLT 217 167    Recent Labs  02/11/13 1447 02/12/13 0530  NA 137 137  K 4.6 4.4  CL 101 104  CO2 26 22  GLUCOSE 255* 141*  BUN 25* 19  CREATININE 1.22* 1.19*    Telemetry: NSR, rare PVC Personally viewed.  Assessment/Plan:  Active Problems:   Acute blood loss anemia   S/P AVR (aortic valve replacement)   CAD (coronary artery disease)   DM (diabetes mellitus)   79 year old with St Jude Aortic valve, mechanical with Anemia, acute blood loss, CAD post PCI, DM  1) AVR  - sharp click. Doing well.  - agree with Dr. Irish Lack in that could hold anticoagulation if felt absolutely needed for up to one week. For now on Heparin IV.   2) CAD  - no angina  3) Blood loss  - no BM. Hg 10.8  4) Chronic anticoag  - INR 1.77  - resume coumadin when able.  Daryn Hicks, [place] 02/12/2013, 10:09 AM

## 2013-02-13 ENCOUNTER — Inpatient Hospital Stay (HOSPITAL_COMMUNITY): Payer: Medicare Other

## 2013-02-13 ENCOUNTER — Encounter (HOSPITAL_COMMUNITY): Payer: Self-pay

## 2013-02-13 ENCOUNTER — Encounter (HOSPITAL_COMMUNITY)
Admission: AD | Disposition: A | Discharge status: Home / Self Care | Payer: Self-pay | Source: Ambulatory Visit | Attending: Internal Medicine

## 2013-02-13 DIAGNOSIS — E119 Type 2 diabetes mellitus without complications: Secondary | ICD-10-CM | POA: Diagnosis not present

## 2013-02-13 DIAGNOSIS — D62 Acute posthemorrhagic anemia: Secondary | ICD-10-CM | POA: Diagnosis not present

## 2013-02-13 DIAGNOSIS — Z7901 Long term (current) use of anticoagulants: Secondary | ICD-10-CM | POA: Diagnosis not present

## 2013-02-13 DIAGNOSIS — K921 Melena: Secondary | ICD-10-CM | POA: Diagnosis not present

## 2013-02-13 DIAGNOSIS — K922 Gastrointestinal hemorrhage, unspecified: Secondary | ICD-10-CM | POA: Diagnosis not present

## 2013-02-13 DIAGNOSIS — K2990 Gastroduodenitis, unspecified, without bleeding: Secondary | ICD-10-CM | POA: Diagnosis not present

## 2013-02-13 DIAGNOSIS — I2510 Atherosclerotic heart disease of native coronary artery without angina pectoris: Secondary | ICD-10-CM | POA: Diagnosis not present

## 2013-02-13 HISTORY — PX: ENTEROSCOPY: SHX5533

## 2013-02-13 HISTORY — PX: COLONOSCOPY: SHX5424

## 2013-02-13 LAB — HEMOGLOBIN AND HEMATOCRIT, BLOOD
HCT: 30.4 % — ABNORMAL LOW (ref 36.0–46.0)
Hemoglobin: 10.6 g/dL — ABNORMAL LOW (ref 12.0–15.0)
Hemoglobin: 10.6 g/dL — ABNORMAL LOW (ref 12.0–15.0)

## 2013-02-13 LAB — TYPE AND SCREEN: Unit division: 0

## 2013-02-13 LAB — PROTIME-INR: Prothrombin Time: 18.3 seconds — ABNORMAL HIGH (ref 11.6–15.2)

## 2013-02-13 LAB — GLUCOSE, CAPILLARY
Glucose-Capillary: 124 mg/dL — ABNORMAL HIGH (ref 70–99)
Glucose-Capillary: 149 mg/dL — ABNORMAL HIGH (ref 70–99)

## 2013-02-13 LAB — CBC
MCHC: 34.2 g/dL (ref 30.0–36.0)
RDW: 15.3 % (ref 11.5–15.5)

## 2013-02-13 LAB — HEPARIN LEVEL (UNFRACTIONATED): Heparin Unfractionated: <0.1 IU/mL — ABNORMAL LOW (ref 0.30–0.70)

## 2013-02-13 SURGERY — ENTEROSCOPY
Anesthesia: Moderate Sedation

## 2013-02-13 MED ORDER — METOPROLOL TARTRATE 50 MG PO TABS
50.0000 mg | ORAL_TABLET | Freq: Two times a day (BID) | ORAL | Status: DC
  Administered 2013-02-13 – 2013-02-15 (×4): 50 mg via ORAL
  Filled 2013-02-13 (×6): qty 1

## 2013-02-13 MED ORDER — SIMVASTATIN 40 MG PO TABS
40.0000 mg | ORAL_TABLET | Freq: Every day | ORAL | Status: DC
  Administered 2013-02-13 – 2013-02-14 (×2): 40 mg via ORAL
  Filled 2013-02-13 (×3): qty 1

## 2013-02-13 MED ORDER — FE FUMARATE-B12-VIT C-FA-IFC PO CAPS
1.0000 | ORAL_CAPSULE | Freq: Every day | ORAL | Status: DC
  Administered 2013-02-13 – 2013-02-15 (×3): 1 via ORAL
  Filled 2013-02-13 (×3): qty 1

## 2013-02-13 MED ORDER — PANTOPRAZOLE SODIUM 40 MG PO TBEC
40.0000 mg | DELAYED_RELEASE_TABLET | Freq: Every day | ORAL | Status: DC
  Administered 2013-02-13 – 2013-02-14 (×2): 40 mg via ORAL
  Filled 2013-02-13: qty 1

## 2013-02-13 MED ORDER — HEPARIN (PORCINE) IN NACL 100-0.45 UNIT/ML-% IJ SOLN
750.0000 [IU]/h | INTRAMUSCULAR | Status: DC
  Filled 2013-02-13: qty 250

## 2013-02-13 MED ORDER — HEPARIN (PORCINE) IN NACL 100-0.45 UNIT/ML-% IJ SOLN
900.0000 [IU]/h | INTRAMUSCULAR | Status: DC
  Administered 2013-02-14: 850 [IU]/h via INTRAVENOUS
  Filled 2013-02-13 (×3): qty 250

## 2013-02-13 MED ORDER — MIDAZOLAM HCL 5 MG/ML IJ SOLN
INTRAMUSCULAR | Status: AC
  Filled 2013-02-13: qty 2

## 2013-02-13 MED ORDER — EZETIMIBE-SIMVASTATIN 10-40 MG PO TABS: 1.0000 | ORAL_TABLET | Freq: Every evening | ORAL | Status: DC

## 2013-02-13 MED ORDER — TECHNETIUM TC 99M-LABELED RED BLOOD CELLS IV KIT
25.0000 | PACK | Freq: Once | INTRAVENOUS | Status: AC | PRN
  Administered 2013-02-13: 25 via INTRAVENOUS

## 2013-02-13 MED ORDER — EZETIMIBE 10 MG PO TABS
10.0000 mg | ORAL_TABLET | Freq: Every day | ORAL | Status: DC
  Administered 2013-02-13 – 2013-02-14 (×2): 10 mg via ORAL
  Filled 2013-02-13 (×3): qty 1

## 2013-02-13 MED ORDER — BUTAMBEN-TETRACAINE-BENZOCAINE 2-2-14 % EX AERO
INHALATION_SPRAY | CUTANEOUS | Status: DC | PRN
  Administered 2013-02-13: 1 via TOPICAL

## 2013-02-13 MED ORDER — ONDANSETRON HCL 4 MG PO TABS: 4.0000 mg | ORAL_TABLET | Freq: Four times a day (QID) | ORAL | Status: DC | PRN

## 2013-02-13 MED ORDER — MIDAZOLAM HCL 5 MG/5ML IJ SOLN
INTRAMUSCULAR | Status: DC | PRN
  Administered 2013-02-13 (×2): 1 mg via INTRAVENOUS
  Administered 2013-02-13: 2 mg via INTRAVENOUS
  Administered 2013-02-13 (×2): 1 mg via INTRAVENOUS

## 2013-02-13 MED ORDER — ONDANSETRON HCL 4 MG/2ML IJ SOLN: 4.0000 mg | Freq: Four times a day (QID) | INTRAMUSCULAR | Status: DC | PRN

## 2013-02-13 MED ORDER — DIGOXIN 125 MCG PO TABS
0.1250 mg | ORAL_TABLET | Freq: Every day | ORAL | Status: DC
  Administered 2013-02-13 – 2013-02-15 (×3): 0.125 mg via ORAL
  Filled 2013-02-13 (×3): qty 1

## 2013-02-13 MED ORDER — ACETAMINOPHEN 650 MG RE SUPP: 650.0000 mg | Freq: Four times a day (QID) | RECTAL | Status: DC | PRN

## 2013-02-13 MED ORDER — COUMADIN BOOK
Freq: Once | Status: AC
  Administered 2013-02-13: 18:00:00
  Filled 2013-02-13: qty 1

## 2013-02-13 MED ORDER — SODIUM CHLORIDE 0.9 % IJ SOLN
3.0000 mL | Freq: Two times a day (BID) | INTRAMUSCULAR | Status: DC
  Administered 2013-02-13 – 2013-02-15 (×3): 3 mL via INTRAVENOUS

## 2013-02-13 MED ORDER — FENTANYL CITRATE 0.05 MG/ML IJ SOLN
INTRAMUSCULAR | Status: DC | PRN
  Administered 2013-02-13 (×4): 25 ug via INTRAVENOUS

## 2013-02-13 MED ORDER — INSULIN ASPART 100 UNIT/ML ~~LOC~~ SOLN
0.0000 [IU] | Freq: Three times a day (TID) | SUBCUTANEOUS | Status: DC
  Administered 2013-02-13 – 2013-02-15 (×4): 1 [IU] via SUBCUTANEOUS

## 2013-02-13 MED ORDER — TANDEM PLUS 162-115.2-1 MG PO CAPS: 1.0000 | ORAL_CAPSULE | Freq: Every day | ORAL | Status: DC

## 2013-02-13 MED ORDER — FENTANYL CITRATE 0.05 MG/ML IJ SOLN
INTRAMUSCULAR | Status: AC
  Filled 2013-02-13: qty 4

## 2013-02-13 MED ORDER — FOLIC ACID 400 MCG PO TABS: 400.0000 ug | ORAL_TABLET | Freq: Every evening | ORAL | Status: DC

## 2013-02-13 MED ORDER — SODIUM CHLORIDE 0.9 % IV SOLN: INTRAVENOUS | Status: DC

## 2013-02-13 MED ORDER — ACETAMINOPHEN 325 MG PO TABS
650.0000 mg | ORAL_TABLET | Freq: Four times a day (QID) | ORAL | Status: DC | PRN
  Administered 2013-02-13: 650 mg via ORAL
  Filled 2013-02-13: qty 2

## 2013-02-13 MED ORDER — ADULT MULTIVITAMIN W/MINERALS CH
1.0000 | ORAL_TABLET | Freq: Every day | ORAL | Status: DC
  Administered 2013-02-13 – 2013-02-15 (×3): 1 via ORAL
  Filled 2013-02-13 (×3): qty 1

## 2013-02-13 MED ORDER — FOLIC ACID 0.5 MG HALF TAB
0.5000 mg | ORAL_TABLET | Freq: Every day | ORAL | Status: DC
  Administered 2013-02-13 – 2013-02-15 (×3): 0.5 mg via ORAL
  Filled 2013-02-13 (×3): qty 1

## 2013-02-13 NOTE — Op Note (Signed)
[facility] [place], [id number]   COLONOSCOPY PROCEDURE REPORT  PATIENT: Renee, [name]  MR#: [id number] BIRTHDATE: [birth year] , 3  yrs. old GENDER: Female ENDOSCOPIST: Lafayette Dragon, MD REFERRED BY:  none PROCEDURE DATE:  02/13/2013 PROCEDURE:   Colonoscopy, diagnostic ASA CLASS:   Class III INDICATIONS:hematochezia and hx avm's small bowl, StJude's heart valve, INR 3.5 on Coumadin, she is in Heparin window. MEDICATIONS: These medications were titrated to patient response per physician's verbal order, Fentanyl 50 mcg IV, and Versed 3 mg IV  DESCRIPTION OF PROCEDURE:   After the risks and benefits and of the procedure were explained, informed consent was obtained.  A digital rectal exam revealed no abnormalities of the rectum.    The Pentax Ped Colon [id number]  endoscope was introduced through the anus and advanced to the cecum, which was identified by both the appendix and ileocecal valve .  The quality of the prep was good, using MoviPrep except for large amount of red blood throughout the colon .  The instrument was then slowly withdrawn as the colon was fully examined.     COLON FINDINGS: Small internal hemorrhoids were found.not bleeding, there was large amount of red blood throughout the colon  to the cecal pouch, but no bleedins site was found, there were no diverticli noted,  the exam was compromised by presence of blood and small clots ,some of them adherent to the wall , TI not intubated     Retroflexed views revealed no abnormalities.     The scope was then withdrawn from the patient and the procedure completed.  COMPLICATIONS: There were no complications. ENDOSCOPIC IMPRESSION: Small internal hemorrhoids large amount of fresh blood throughout the colon extending into cecum, without specific bleeding  site, particularly there were no diverticuli, the exam was compromised by presence of blood in the lumen and coating  the walls  RECOMMENDATIONS: restart Heparin and obtain RBC pool scan to assess for small bowl bleed hold Coumadin clear liquids follow H/H closely  REPEAT EXAM: pending results of bleeding scan  cc:  _______________________________ eSignedLafayette Dragon, MD 02/13/2013 10:00 AM     PATIENT NAME:  [name] MR#: [id number]

## 2013-02-13 NOTE — Progress Notes (Signed)
Just returned from Nuclear Medicine, RBC scan is normal in 2 hours. VS's stable, no stools. We will continue Heparin infusion and hold restarting the Coumadin. Recheck H/H q 8 hrs.

## 2013-02-13 NOTE — Progress Notes (Signed)
ANTICOAGULATION CONSULT NOTE - Follow Up Consult  Pharmacy Consult for Heparin Indication: Mechanical AVR ([employer]. Judes)  Allergies  Allergen Reactions  . Penicillins Rash    Patient Measurements: Height: 5' (152.4 cm) Weight: 153 lb 3.5 oz (69.5 kg) IBW/kg (Calculated) : 45.5 Heparin Dosing Weight: 59.8kg  Vital Signs: Temp: 98.3 F (36.8 C) (05/25 0951) Temp src: Oral (05/25 0951) BP: 133/51 mmHg (05/25 1000) Pulse Rate: 65 (05/25 0432)  Labs:  Recent Labs  02/11/13 1447  02/12/13 0530 02/12/13 0850 02/12/13 1512 02/12/13 2000 02/13/13 0236  HGB 8.6*  --  10.8*  --  11.5*  --  10.7*  HCT 25.3*  --  31.2*  --  32.4*  --  31.3*  PLT 217  --  167  --  165  --  166  LABPROT 21.0*  --  20.0*  --   --   --  18.3*  INR 1.89*  --  1.77*  --   --   --  1.57*  HEPARINUNFRC  --   < >  --  0.49  --  0.51 0.43  CREATININE 1.22*  --  1.19*  --   --   --   --   < > = values in this interval not displayed.  Estimated Creatinine Clearance: 34.4 ml/min (by C-G formula based on Cr of 1.19).   Medications:  Heparin on hold for procedure  Assessment: 85 y/oF who was has been on heparin for mechanical AVR (St. Jude's).  On Coumadin PTA but pt now with GI bleed, aiming for lower goal range of 0.2-0.4. Heparin has been on hold for EGD/colonoscopy this morning.  Now wanting to resume heparin drip with no bolus.  Last rate was 750 units/hr.    Goal of Therapy:  Heparin level 0.2-0.4 (per MD given possible GI bleed) Monitor platelets by anticoagulation protocol: Yes   Plan:  1. Resume heparin 750 units/hr. 2. Check heparin level in 8 hrs. 3. Daily heparin level and CBC. 4. Monitor for increased bleeding.   Ocie Doyne, PharmD Clinical Pharmacist Pager: [phone] Phone: [phone] 02/13/2013 10:19 AM

## 2013-02-13 NOTE — Progress Notes (Signed)
[facility] PROGRESS NOTE  [name] [id number] DOB: [birth year] DOA: 02/11/2013 PCP: Simona Huh, MD  Assessment/Plan: 1. Acute blood loss anemia:  - suspect related to AVMs and Coagulopathy from coumadin - subacute gastrointestinal bleeding After admission we did hold coumadin and let INR drift down, once <2 we did  bridge with IV heparin without bolus starting on 5/23.  -transfused 2 units PRBC on 5/23.  The patient was placed on-PPI from admission -EGD, colonoscopy and NMTRBC were done on 5/25 without a source of bleeding identified.  Hemoglobin was carefully monitored and has remained stable post transfusion.   2. CAD/PCI  -stable . Asymptomatic.  -continue metoprolol, hold ASA   3. S/p Mechanical AVR  -hold coumadin as noted above and bridge with heparin  -Appreciate consult from dr.Varanasi on the day of admission, ok to hold anticoagulation for upto 7 days with this valve per dr.Varanasi   4. DM: hold oral hypoglycemics, SSI for now      Code Status: Full code Family Communication: Patient Disposition Plan: Home   Consultants:  G[facility] - [place]   [facility] - [facility]  Procedures:  Transfusion of 2 units of PRBCS 5/23  EGD 5/25  Colonoscopy 5/25  NMTRBC study 5/25  Antibiotics:  HPI/Subjective: No chest pain, no abdominal pain, just back from NM  Objective: Filed Vitals:   02/13/13 0945 02/13/13 0950 02/13/13 0951 02/13/13 1000  BP: 152/61 142/52  133/51  Pulse:      Temp:   98.3 F (36.8 C)   TempSrc:   Oral   Resp: 21 19  22   Height:      Weight:      SpO2: 100% 97%  96%   Patient Vitals for the past 24 hrs:  BP Temp Temp src Pulse Resp SpO2 Weight  02/13/13 1000 133/51 mmHg - - - 22 96 % -  02/13/13 0951 - 98.3 F (36.8 C) Oral - - - -  02/13/13 0950 142/52 mmHg - - - 19 97 % -  02/13/13 0945 152/61 mmHg - - - 21 100 % -  02/13/13 0940 164/61 mmHg - - - 16 99 % -  02/13/13 0935 162/69 mmHg - - - 12 98 % -   02/13/13 0930 159/80 mmHg - - - 17 99 % -  02/13/13 0925 82/31 mmHg - - - 20 96 % -  02/13/13 0920 168/111 mmHg - - - 18 99 % -  02/13/13 0915 134/62 mmHg - - - 13 99 % -  02/13/13 0910 174/64 mmHg - - - 16 99 % -  02/13/13 0905 207/97 mmHg - - - 22 99 % -  02/13/13 0900 213/95 mmHg - - - 20 100 % -  02/13/13 0855 153/85 mmHg - - - 19 100 % -  02/13/13 0850 171/74 mmHg - - - 19 100 % -  02/13/13 0845 171/74 mmHg - - - 19 100 % -  02/13/13 0825 151/64 mmHg 98 F (36.7 C) Oral - 18 99 % -  02/13/13 0432 139/52 mmHg 98.2 F (36.8 C) Oral 65 16 99 % -  02/12/13 2055 160/50 mmHg 98 F (36.7 C) Oral 71 18 100 % 69.5 kg (153 lb 3.5 oz)  02/12/13 1743 172/70 mmHg 97.6 F (36.4 C) Oral 73 18 100 % -     Intake/Output Summary (Last 24 hours) at 02/13/13 1450 Last data filed at 02/13/13 0700  Gross per 24 hour  Intake 2721.39 ml  Output  3 ml  Net 2718.39 ml   Filed Weights   02/11/13 2052 02/12/13 2055  Weight: 67.614 kg (149 lb 1 oz) 69.5 kg (153 lb 3.5 oz)    Exam:   General:  Alert and oriented x3  Cardiovascular: Regular rate and rhythm, 2/6 systolic murmur at the apex, metallic click in the aortic area  Respiratory: Clear to auscultation bilaterally  Abdomen: Soft nontender bowel sounds present  Musculoskeletal: No edema   Data Reviewed: Basic Metabolic Panel:  Recent Labs Lab 02/11/13 1447 02/12/13 0530  NA 137 137  K 4.6 4.4  CL 101 104  CO2 26 22  GLUCOSE 255* 141*  BUN 25* 19  CREATININE 1.22* 1.19*  CALCIUM 9.2 8.9   Liver Function Tests:  Recent Labs Lab 02/11/13 1447  AST 31  ALT 36*  ALKPHOS 48  BILITOT 0.6  PROT 6.4  ALBUMIN 3.5   No results found for this basename: LIPASE, AMYLASE,  in the last 168 hours No results found for this basename: AMMONIA,  in the last 168 hours CBC:  Recent Labs Lab 02/11/13 1447 02/12/13 0530 02/12/13 1512 02/13/13 0236 02/13/13 1415  WBC 5.2 7.3 7.0 6.2  --   HGB 8.6* 10.8* 11.5* 10.7* 10.6*   HCT 25.3* 31.2* 32.4* 31.3* 31.2*  MCV 92.0 89.4 89.0 89.7  --   PLT 217 167 165 166  --    Cardiac Enzymes: No results found for this basename: CKTOTAL, CKMB, CKMBINDEX, TROPONINI,  in the last 168 hours BNP (last 3 results) No results found for this basename: PROBNP,  in the last 8760 hours CBG:  Recent Labs Lab 02/12/13 0721 02/12/13 1213 02/12/13 1642 02/12/13 2058 02/13/13 0804  GLUCAP 147* 163* 98 97 124*  Results for CLAIR, NICKLAS (MRN [id number]) as of 02/13/2013 14:41  Ref. Range 02/13/2013 02:36  Prothrombin Time Latest Range: 11.6-15.2 seconds 18.3 (H)  INR Latest Range: 0.00-1.49  1.57 (H)    No results found for this or any previous visit (from the past 240 hour(s)).   Studies: Nm Gi Blood Loss  02/13/2013   *RADIOLOGY REPORT*  Clinical Data: Light red blood in stool.  Large amount of fresh blood in the colon at recent colonoscopy with no bleeding site identified.  NUCLEAR MEDICINE GASTROINTESTINAL BLEEDING STUDY  Technique:  Sequential abdominal images were obtained following intravenous administration of Tc-53m labeled red blood cells.  Radiopharmaceutical: 25MILLI CURIE ULTRATAG TECHNETIUM TC 57M- LABELED RED BLOOD CELLS IV KIT  Comparison: Recent endoscopy and colonoscopy reports.  Findings: Normal visualization of intra-arterial tracer, liver, spleen and urinary bladder.  No gastrointestinal activity seen up to 2 hours after injection.  IMPRESSION: No active gastrointestinal bleeding.   Original Report Authenticated By: Claudie Revering, M.D.    Scheduled Meds: . digoxin  0.125 mg Oral Daily  . ezetimibe  10 mg Oral q1800  . ferrous Q000111Q C-folic acid  1 capsule Oral Daily  . folic acid  0.5 mg Oral Daily  . insulin aspart  0-9 Units Subcutaneous TID WC  . metoprolol  50 mg Oral BID  . multivitamin with minerals  1 tablet Oral Daily  . pantoprazole  40 mg Oral Q1200  . simvastatin  40 mg Oral q1800  . sodium chloride  3 mL Intravenous Q12H     Continuous Infusions: Heparin drip    Active Problems:   Acute blood loss anemia   S/P AVR (aortic valve replacement)   CAD (coronary artery disease)   DM (diabetes mellitus)  [place]  [facility] Pager [phone]. If 7PM-7AM, please contact night-coverage at [email or web address], password [facility] 02/13/2013, 2:50 PM  LOS: 2 days

## 2013-02-13 NOTE — Progress Notes (Signed)
Coumadin book and some prints were give to pt. And her husband.keep monitoring pt. Closely.

## 2013-02-13 NOTE — Progress Notes (Signed)
Pt just completed enteroscopy and colonoscopy. There was large amount of fresh blood in the colon extending into her cecum but we could not detect a specific bleeding site. She is going back on Heparin infusion and have a RBC pool scan in Nuclear Medicine. Will keep her on clear liquids. Hold Coumadin. Check H/H q 8 hrs.

## 2013-02-13 NOTE — Op Note (Signed)
[facility] [place], [address]   OPERATIVE PROCEDURE REPORT  PATIENT: [name], [name]  MR#: [id number] BIRTHDATE: Nov 04, 1932 , 44  yrs. old GENDER: Female ENDOSCOPIST: Lafayette Dragon, MD REFERRED BY:  none PROCEDURE DATE: 02/13/2013 PROCEDURE:   Diagnostic small bowel enteroscopy ASA CLASS:   Class III INDICATIONS:1.  melenic bleeding.   2.  red rectal bleeding.   3. a-v malformation.   4.  iron deficiency anemia.   5.  hx of Jude's valve replacement, on Coumadin, INR 3.5, Bloody stools, known avm's from SBCE 8 years ago. MEDICATIONS: These medications were titrated to patient response per physician's verbal order, Fentanyl 50 mcg IV, and Versed 5 mg IV  TOPICAL ANESTHETIC:   Cetacaine Spray  DESCRIPTION OF PROCEDURE:   After the risks benefits and alternatives of the procedure were thoroughly explained, informed consent was obtained.  The [id number]  endoscope was introduced through the mouth  and advanced to the proximal jejunum jejunum , limited by Without limitations.   The instrument was slowly withdrawn as the mucosa was fully examined.    Gastritis was found in the antrum.  A biopsy for H.  pylori was taken.   Retroflexed views revealed no abnormalities.    The scope was then withdrawn from the patient and the procedure terminated.  COMPLICATIONS: There were no complications. ENDOSCOPIC IMPRESSION: Gastritis was found in the antrum, 4 punched out erosions in prepyloric antrum, not bleeding no AVM's detected  RECOMMENDATIONS: await pathology results PPI colonoscopy  REPEAT EXAM: no  _______________________________ eSignedLafayette Dragon, MD 02/13/2013 9:47 AM   CC:

## 2013-02-13 NOTE — Progress Notes (Signed)
ANTICOAGULATION CONSULT NOTE - Follow Up Consult  Pharmacy Consult for Heparin Indication: Mechanical AVR ([employer]. Judes)  Allergies  Allergen Reactions  . Penicillins Rash    Patient Measurements: Height: 5' (152.4 cm) Weight: 153 lb 3.5 oz (69.5 kg) IBW/kg (Calculated) : 45.5 Heparin Dosing Weight: 59.8kg  Vital Signs: Temp: 97.4 F (36.3 C) (05/25 1745) Temp src: Oral (05/25 1745) BP: 179/72 mmHg (05/25 1745) Pulse Rate: 97 (05/25 1745)  Labs:  Recent Labs  02/11/13 1447  02/12/13 0530  02/12/13 1512  02/13/13 0236 02/13/13 1415 02/13/13 1820 02/13/13 2053  HGB 8.6*  --  10.8*  --  11.5*  --  10.7* 10.6* 10.6*  --   HCT 25.3*  --  31.2*  --  32.4*  --  31.3* 31.2* 30.4*  --   PLT 217  --  167  --  165  --  166  --   --   --   LABPROT 21.0*  --  20.0*  --   --   --  18.3*  --   --   --   INR 1.89*  --  1.77*  --   --   --  1.57*  --   --   --   HEPARINUNFRC  --   < >  --   < >  --   < > 0.43  --  <0.10* <0.10*  CREATININE 1.22*  --  1.19*  --   --   --   --   --   --   --   < > = values in this interval not displayed.  Estimated Creatinine Clearance: 34.4 ml/min (by C-G formula based on Cr of 1.19).    Assessment: 45 y/oF who was has been on heparin for mechanical AVR (St. Jude's).  On Coumadin PTA but pt now with GI bleed, aiming for lower goal range of 0.2-0.4. Heparin was on hold for EGD/colonoscopy this morning.   Drip was resumed with no bolus at750 units/hr.  HL undectectable at < 0.1.  Per RN heparin IV is infusing well with no problems.  Nuc Med RBC scan normal.    Goal of Therapy:  Heparin level 0.2-0.4 (per MD given possible GI bleed) Monitor platelets by anticoagulation protocol: Yes   Plan:  1. Increase heparin drip to 850 units/hr. 2. Check heparin level in ~ 7 hrs w/ am  labs 3. Daily heparin level and CBC. 4. Monitor for increased bleeding. Eudelia Bunch, Pharm.D. [id number] 02/13/2013 9:45 PM

## 2013-02-14 DIAGNOSIS — Z954 Presence of other heart-valve replacement: Secondary | ICD-10-CM

## 2013-02-14 DIAGNOSIS — I2510 Atherosclerotic heart disease of native coronary artery without angina pectoris: Secondary | ICD-10-CM | POA: Diagnosis not present

## 2013-02-14 DIAGNOSIS — K922 Gastrointestinal hemorrhage, unspecified: Secondary | ICD-10-CM | POA: Diagnosis not present

## 2013-02-14 DIAGNOSIS — Z7901 Long term (current) use of anticoagulants: Secondary | ICD-10-CM | POA: Diagnosis not present

## 2013-02-14 DIAGNOSIS — I509 Heart failure, unspecified: Secondary | ICD-10-CM

## 2013-02-14 DIAGNOSIS — D62 Acute posthemorrhagic anemia: Secondary | ICD-10-CM | POA: Diagnosis not present

## 2013-02-14 LAB — GLUCOSE, CAPILLARY
Glucose-Capillary: 127 mg/dL — ABNORMAL HIGH (ref 70–99)
Glucose-Capillary: 124 mg/dL — ABNORMAL HIGH (ref 70–99)
Glucose-Capillary: 127 mg/dL — ABNORMAL HIGH (ref 70–99)

## 2013-02-14 LAB — BASIC METABOLIC PANEL
BUN: 12 mg/dL (ref 6–23)
Calcium: 8.8 mg/dL (ref 8.4–10.5)
Creatinine, Ser: 1.3 mg/dL — ABNORMAL HIGH (ref 0.50–1.10)
GFR calc Af Amer: 45 mL/min — ABNORMAL LOW (ref >90)
GFR calc non Af Amer: 39 mL/min — ABNORMAL LOW (ref >90)
Glucose, Bld: 132 mg/dL — ABNORMAL HIGH (ref 70–99)
Potassium: 4.4 mEq/L (ref 3.5–5.1)

## 2013-02-14 LAB — HEMOGLOBIN AND HEMATOCRIT, BLOOD
HCT: 29.8 % — ABNORMAL LOW (ref 36.0–46.0)
Hemoglobin: 10.2 g/dL — ABNORMAL LOW (ref 12.0–15.0)

## 2013-02-14 LAB — HEPARIN LEVEL (UNFRACTIONATED): Heparin Unfractionated: 0.19 IU/mL — ABNORMAL LOW (ref 0.30–0.70)

## 2013-02-14 MED ORDER — WARFARIN SODIUM 5 MG PO TABS: 5.0000 mg | ORAL_TABLET | Freq: Once | ORAL | Status: DC

## 2013-02-14 MED ORDER — WARFARIN SODIUM 5 MG PO TABS
5.0000 mg | ORAL_TABLET | Freq: Once | ORAL | Status: AC
  Administered 2013-02-14: 5 mg via ORAL
  Filled 2013-02-14: qty 1

## 2013-02-14 MED ORDER — WARFARIN - PHARMACIST DOSING INPATIENT: Freq: Every day | Status: DC

## 2013-02-14 MED ORDER — FUROSEMIDE 10 MG/ML IJ SOLN
40.0000 mg | Freq: Once | INTRAMUSCULAR | Status: AC
  Administered 2013-02-14: 40 mg via INTRAVENOUS
  Filled 2013-02-14: qty 4

## 2013-02-14 NOTE — Progress Notes (Addendum)
ANTICOAGULATION CONSULT NOTE - Follow Up Consult  Pharmacy Consult for Heparin Indication: Mechanical AVR (St. Judes)  Allergies  Allergen Reactions  . Penicillins Rash    Patient Measurements: Height: 5' (152.4 cm) Weight: 152 lb 1.9 oz (69 kg) IBW/kg (Calculated) : 45.5 Heparin Dosing Weight: 59.8kg  Vital Signs: Temp: 98.1 F (36.7 C) (05/26 0513) Temp src: Oral (05/26 0513) BP: 121/58 mmHg (05/26 0513) Pulse Rate: 66 (05/26 0513)  Labs:  Recent Labs  02/11/13 1447  02/12/13 0530  02/12/13 1512  02/13/13 0236 02/13/13 1415 02/13/13 1820 02/13/13 2053 02/14/13 0235 02/14/13 0700  HGB 8.6*  --  10.8*  --  11.5*  --  10.7* 10.6* 10.6*  --  10.2*  --   HCT 25.3*  --  31.2*  --  32.4*  --  31.3* 31.2* 30.4*  --  29.8*  --   PLT 217  --  167  --  165  --  166  --   --   --   --   --   LABPROT 21.0*  --  20.0*  --   --   --  18.3*  --   --   --   --   --   INR 1.89*  --  1.77*  --   --   --  1.57*  --   --   --   --   --   HEPARINUNFRC  --   < >  --   < >  --   < > 0.43  --  <0.10* <0.10*  --  0.22*  CREATININE 1.22*  --  1.19*  --   --   --   --   --   --   --  1.30*  --   < > = values in this interval not displayed.  Estimated Creatinine Clearance: 31.4 ml/min (by C-G formula based on Cr of 1.3).    Assessment: 25 y/oF who was has been on heparin for mechanical AVR (St. Jude's).  On Coumadin PTA but pt now with GI bleed, aiming for lower goal range of 0.2-0.4. Heparin level 0.22 this morning. In light of lower goal, this is being considered therapeutic. Nuc Med RBC scan normal.    Resuming Coumadin now with goal INR 2.0-3.0.  Goal of Therapy:  Heparin level 0.2-0.4 (per MD given possible GI bleed) INR 2.0-3.0  Monitor platelets by anticoagulation protocol: Yes   Plan:  1. Cont heparin drip at 850 units/hr. 2. Check heparin level in 8 hrs 3. Daily heparin level and CBC. 4. Monitor for increased bleeding.  5. Coumadin 5 mg x1 po 6. Daily INR   Ocie Doyne, PharmD Clinical Pharmacist Pager: [phone] Phone: [phone] 02/14/2013 8:34 AM

## 2013-02-14 NOTE — Progress Notes (Signed)
ANTICOAGULATION CONSULT NOTE - Follow Up Consult  Pharmacy Consult for Heparin Indication: Mechanical AVR (St. Judes)  Allergies  Allergen Reactions  . Penicillins Rash    Patient Measurements: Height: 5' (152.4 cm) Weight: 152 lb 1.9 oz (69 kg) IBW/kg (Calculated) : 45.5 Heparin Dosing Weight: 59.8kg  Vital Signs: Temp: 98 F (36.7 C) (05/26 1035) Temp src: Oral (05/26 1035) BP: 142/66 mmHg (05/26 1035) Pulse Rate: 59 (05/26 1035)  Labs:  Recent Labs  02/12/13 0530  02/12/13 1512  02/13/13 0236  02/13/13 1820 02/13/13 2053 02/14/13 0235 02/14/13 0700 02/14/13 0953 02/14/13 1611  HGB 10.8*  --  11.5*  --  10.7*  < > 10.6*  --  10.2*  --  10.3*  --   HCT 31.2*  --  32.4*  --  31.3*  < > 30.4*  --  29.8*  --  30.5*  --   PLT 167  --  165  --  166  --   --   --   --   --   --   --   LABPROT 20.0*  --   --   --  18.3*  --   --   --   --   --   --   --   INR 1.77*  --   --   --  1.57*  --   --   --   --   --   --   --   HEPARINUNFRC  --   < >  --   < > 0.43  --  <0.10* <0.10*  --  0.22*  --  0.19*  CREATININE 1.19*  --   --   --   --   --   --   --  1.30*  --   --   --   < > = values in this interval not displayed.  Estimated Creatinine Clearance: 31.4 ml/min (by C-G formula based on Cr of 1.3).    Assessment: [name] who was has been on heparin for mechanical AVR (St. Jude's).  On Coumadin PTA but pt now with GI bleed, aiming for lower goal range of 0.2-0.4.  Nuc Med RBC scan normal.  Repeat Heparin level is now slightly below goal at 0.19.   Goal of Therapy:  Heparin level 0.2-0.4 (per MD given possible GI bleed) Monitor platelets by anticoagulation protocol: Yes   Plan:  Increase heparin drip to 900 units/hr. Daily heparin level and CBC. Monitor for increased bleeding.  Jerrye Beavers, PharmD, BCPS Clinical Pharmacist  Pager: [phone]

## 2013-02-14 NOTE — Progress Notes (Signed)
[facility] PROGRESS NOTE  [name] [id number] DOB: 09/24/1932 DOA: 02/11/2013 PCP: Simona Huh, MD  Assessment/Plan: 1. Acute blood loss anemia:  - suspect related to AVMs and Coagulopathy from coumadin - subacute gastrointestinal bleeding After admission we did hold coumadin and let INR drift down, once <2 we did  bridge with IV heparin without bolus starting on 5/23.  Transfused 2 units PRBC on 5/23.  The patient was placed on-PPI from admission -EGD, colonoscopy and NMTRBC were done on 5/25 without a source of bleeding identified.  Hemoglobin was carefully monitored and has remained stable post transfusion.  Advance diet today  2. CAD/PCI  -stable . Asymptomatic.  -continue metoprolol, hold ASA   3. S/p Mechanical AVR  -hold coumadin as noted above and bridge with heparin  -Appreciate consult from dr.Varanasi on the day of admission, ok to hold anticoagulation for upto 7 days with this valve per dr.Varanasi   4. DM: hold oral hypoglycemics, SSI for now   5. CHF syndrome - with LE edema - given iv lasix on 5/26      Code Status: Full code Family Communication: Patient Disposition Plan: Home   Consultants:  Gastroenterology - [place]   Cardiology - [facility]  Procedures:  Transfusion of 2 units of PRBCS 5/23  EGD 5/25  Colonoscopy 5/25  NMTRBC study 5/25  Antibiotics:  HPI/Subjective: No chest pain, no abdominal pain  Objective: Filed Vitals:   02/13/13 1745 02/13/13 2212 02/14/13 0513 02/14/13 1035  BP: 179/72 141/56 121/58 142/66  Pulse: 97 78 66 59  Temp: 97.4 F (36.3 C) 98.7 F (37.1 C) 98.1 F (36.7 C) 98 F (36.7 C)  TempSrc: Oral Oral Oral Oral  Resp: 18 18 16 16   Height:      Weight:  69 kg (152 lb 1.9 oz)    SpO2: 100% 100% 98% 97%   Patient Vitals for the past 24 hrs:  BP Temp Temp src Pulse Resp SpO2 Weight  02/14/13 1035 142/66 mmHg 98 F (36.7 C) Oral 59 16 97 % -  02/14/13 0513 121/58 mmHg 98.1 F (36.7 C)  Oral 66 16 98 % -  02/13/13 2212 141/56 mmHg 98.7 F (37.1 C) Oral 78 18 100 % 69 kg (152 lb 1.9 oz)  02/13/13 1745 179/72 mmHg 97.4 F (36.3 C) Oral 97 18 100 % -  02/13/13 1458 158/76 mmHg 98.3 F (36.8 C) Oral 84 17 96 % -     Intake/Output Summary (Last 24 hours) at 02/14/13 1046 Last data filed at 02/14/13 1002  Gross per 24 hour  Intake 1890.68 ml  Output      0 ml  Net 1890.68 ml   Filed Weights   02/11/13 2052 02/12/13 2055 02/13/13 2212  Weight: 67.614 kg (149 lb 1 oz) 69.5 kg (153 lb 3.5 oz) 69 kg (152 lb 1.9 oz)    Exam:   General:  Alert and oriented x3  Cardiovascular: Regular rate and rhythm, 2/6 systolic murmur at the apex, metallic click in the aortic area  Respiratory: Clear to auscultation bilaterally  Abdomen: Soft nontender bowel sounds present  Musculoskeletal: No edema   Data Reviewed: Basic Metabolic Panel:  Recent Labs Lab 02/11/13 1447 02/12/13 0530 02/14/13 0235  NA 137 137 135  K 4.6 4.4 4.4  CL 101 104 105  CO2 26 22 22   GLUCOSE 255* 141* 132*  BUN 25* 19 12  CREATININE 1.22* 1.19* 1.30*  CALCIUM 9.2 8.9 8.8   Liver Function Tests:  Recent Labs Lab 02/11/13 1447  AST 31  ALT 36*  ALKPHOS 48  BILITOT 0.6  PROT 6.4  ALBUMIN 3.5   No results found for this basename: LIPASE, AMYLASE,  in the last 168 hours No results found for this basename: AMMONIA,  in the last 168 hours CBC:  Recent Labs Lab 02/11/13 1447 02/12/13 0530 02/12/13 1512 02/13/13 0236 02/13/13 1415 02/13/13 1820 02/14/13 0235 02/14/13 0953  WBC 5.2 7.3 7.0 6.2  --   --   --   --   HGB 8.6* 10.8* 11.5* 10.7* 10.6* 10.6* 10.2* 10.3*  HCT 25.3* 31.2* 32.4* 31.3* 31.2* 30.4* 29.8* 30.5*  MCV 92.0 89.4 89.0 89.7  --   --   --   --   PLT 217 167 165 166  --   --   --   --    Cardiac Enzymes: No results found for this basename: CKTOTAL, CKMB, CKMBINDEX, TROPONINI,  in the last 168 hours BNP (last 3 results) No results found for this basename:  PROBNP,  in the last 8760 hours CBG:  Recent Labs Lab 02/13/13 0804 02/13/13 1456 02/13/13 1618 02/13/13 2215 02/14/13 0744  GLUCAP 124* 149* 124* 103* 127*  Results for [name], [name] (MRN [id number]) as of 02/13/2013 14:41  Ref. Range 02/13/2013 02:36  Prothrombin Time Latest Range: 11.6-15.2 seconds 18.3 (H)  INR Latest Range: 0.00-1.49  1.57 (H)    No results found for this or any previous visit (from the past 240 hour(s)).   Studies: Nm Gi Blood Loss  02/13/2013   *RADIOLOGY REPORT*  Clinical Data: Light red blood in stool.  Large amount of fresh blood in the colon at recent colonoscopy with no bleeding site identified.  NUCLEAR MEDICINE GASTROINTESTINAL BLEEDING STUDY  Technique:  Sequential abdominal images were obtained following intravenous administration of Tc-21m labeled red blood cells.  Radiopharmaceutical: 25MILLI CURIE ULTRATAG TECHNETIUM TC 4M- LABELED RED BLOOD CELLS IV KIT  Comparison: Recent endoscopy and colonoscopy reports.  Findings: Normal visualization of intra-arterial tracer, liver, spleen and urinary bladder.  No gastrointestinal activity seen up to 2 hours after injection.  IMPRESSION: No active gastrointestinal bleeding.   Original Report Authenticated By: Claudie Revering, M.D.    Scheduled Meds: . digoxin  0.125 mg Oral Daily  . ezetimibe  10 mg Oral q1800  . ferrous Q000111Q C-folic acid  1 capsule Oral Daily  . folic acid  0.5 mg Oral Daily  . insulin aspart  0-9 Units Subcutaneous TID WC  . metoprolol  50 mg Oral BID  . multivitamin with minerals  1 tablet Oral Daily  . pantoprazole  40 mg Oral Q1200  . simvastatin  40 mg Oral q1800  . sodium chloride  3 mL Intravenous Q12H    Continuous Infusions: Heparin drip    Active Problems:   Acute blood loss anemia   S/P AVR (aortic valve replacement)   CAD (coronary artery disease)   DM (diabetes mellitus)      Broderick Fonseca  [facility] Pager [phone]. If 7PM-7AM, please  contact night-coverage at [email or web address], password [facility] 02/14/2013, 10:46 AM  LOS: 3 days

## 2013-02-14 NOTE — Progress Notes (Signed)
[name] Progress Note   Subjective  No stools, feels fine, hungry,   Objective  S/p GIB- enteroscopy and colonoscopy,  Fresh blood was present in the colon but Hgb has not dropped and RBC pool scan was negative for active bleeding after 2 hours  while on Heparin infusion Vital signs in last 24 hours: Temp:  [97.4 F (36.3 C)-98.7 F (37.1 C)] 98 F (36.7 C) (05/26 1035) Pulse Rate:  [59-97] 59 (05/26 1035) Resp:  [16-18] 16 (05/26 1035) BP: (121-179)/(56-76) 142/66 mmHg (05/26 1035) SpO2:  [96 %-100 %] 97 % (05/26 1035) Weight:  [152 lb 1.9 oz (69 kg)] 152 lb 1.9 oz (69 kg) (05/25 2212) Last BM Date: 02/13/13 General:    white female in NAD Heart:  Regular rate and rhythm;sys prosthetic sound Lungs: Respirations even and unlabored, lungs CTA bilaterally Abdomen:  Soft, nontender and nondistended. Normal bowel sounds. Extremities:  Without edema. Neurologic:  Alert and oriented,  grossly normal neurologically. Psych:  Cooperative. Normal mood and affect.  Intake/Output from previous day: 05/25 0701 - 05/26 0700 In: 1410.7 [P.O.:1200; I.V.:130.7] Out: -  Intake/Output this shift: Total I/O In: 480 [P.O.:480] Out: -   Lab Results:  Recent Labs  02/12/13 0530 02/12/13 1512 02/13/13 0236  02/13/13 1820 02/14/13 0235 02/14/13 0953  WBC 7.3 7.0 6.2  --   --   --   --   HGB 10.8* 11.5* 10.7*  < > 10.6* 10.2* 10.3*  HCT 31.2* 32.4* 31.3*  < > 30.4* 29.8* 30.5*  PLT 167 165 166  --   --   --   --   < > = values in this interval not displayed. BMET  Recent Labs  02/11/13 1447 02/12/13 0530 02/14/13 0235  NA 137 137 135  K 4.6 4.4 4.4  CL 101 104 105  CO2 26 22 22   GLUCOSE 255* 141* 132*  BUN 25* 19 12  CREATININE 1.22* 1.19* 1.30*  CALCIUM 9.2 8.9 8.8   LFT  Recent Labs  02/11/13 1447  PROT 6.4  ALBUMIN 3.5  AST 31  ALT 36*  ALKPHOS 48  BILITOT 0.6   PT/INR  Recent Labs  02/12/13 0530 02/13/13 0236  LABPROT 20.0* 18.3*  INR  1.77* 1.57*    Studies/Results: Nm Gi Blood Loss  02/13/2013   *RADIOLOGY REPORT*  Clinical Data: Light red blood in stool.  Large amount of fresh blood in the colon at recent colonoscopy with no bleeding site identified.  NUCLEAR MEDICINE GASTROINTESTINAL BLEEDING STUDY  Technique:  Sequential abdominal images were obtained following intravenous administration of Tc-77m labeled red blood cells.  Radiopharmaceutical: 25MILLI CURIE ULTRATAG TECHNETIUM TC 73M- LABELED RED BLOOD CELLS IV KIT  Comparison: Recent endoscopy and colonoscopy reports.  Findings: Normal visualization of intra-arterial tracer, liver, spleen and urinary bladder.  No gastrointestinal activity seen up to 2 hours after injection.  IMPRESSION: No active gastrointestinal bleeding.   Original Report Authenticated By: Claudie Revering, M.D.       Assessment:  Very stable after a recurrent GIB likely of small bowl origin ( known avm's), since no further diagnostic tests planned, will resume Coumadin and advance diet. I wonder if we could bridge her with Lovenox ( she did it before) instead of Heparin in order to reduce her hospitalization time.     Plan:  I have spoken to Dr Marye Round, and he will discuss with Dr Marlou Porch.  Active Problems:   Acute blood loss anemia   S/P AVR (aortic valve replacement)  CAD (coronary artery disease)   DM (diabetes mellitus)     LOS: 3 days   Delfin Edis  02/14/2013, 11:29 AM

## 2013-02-14 NOTE — Progress Notes (Addendum)
Subjective:  No complaints. She was going to the bathroom when I spoke to her.  Objective:  Vital Signs in the last 24 hours: Temp:  [97.4 F (36.3 C)-98.7 F (37.1 C)] 98.1 F (36.7 C) (05/26 0513) Pulse Rate:  [66-97] 66 (05/26 0513) Resp:  [12-22] 16 (05/26 0513) BP: (82-207)/(31-111) 121/58 mmHg (05/26 0513) SpO2:  [96 %-100 %] 98 % (05/26 0513) Weight:  [69 kg (152 lb 1.9 oz)] 69 kg (152 lb 1.9 oz) (05/25 2212)  Intake/Output from previous day: 05/25 0701 - 05/26 0700 In: 1410.7 [P.O.:1200; I.V.:130.7] Out: -    Physical Exam: General: In no acute distress. On IV heparin.   Lab Results:  Recent Labs  02/12/13 1512 02/13/13 0236  02/13/13 1820 02/14/13 0235  WBC 7.0 6.2  --   --   --   HGB 11.5* 10.7*  < > 10.6* 10.2*  PLT 165 166  --   --   --   < > = values in this interval not displayed.  Recent Labs  02/12/13 0530 02/14/13 0235  NA 137 135  K 4.4 4.4  CL 104 105  CO2 22 22  GLUCOSE 141* 132*  BUN 19 12  CREATININE 1.19* 1.30*    Imaging: Nm Gi Blood Loss  02/13/2013   *RADIOLOGY REPORT*  Clinical Data: Light red blood in stool.  Large amount of fresh blood in the colon at recent colonoscopy with no bleeding site identified.  NUCLEAR MEDICINE GASTROINTESTINAL BLEEDING STUDY  Technique:  Sequential abdominal images were obtained following intravenous administration of Tc-20m labeled red blood cells.  Radiopharmaceutical: 25MILLI CURIE ULTRATAG TECHNETIUM TC 59M- LABELED RED BLOOD CELLS IV KIT  Comparison: Recent endoscopy and colonoscopy reports.  Findings: Normal visualization of intra-arterial tracer, liver, spleen and urinary bladder.  No gastrointestinal activity seen up to 2 hours after injection.  IMPRESSION: No active gastrointestinal bleeding.   Original Report Authenticated By: Claudie Revering, M.D.   Personally viewed.   Telemetry: No adverse arrhythmias Personally viewed.   Assessment/Plan:  Active Problems:   Acute blood loss anemia   S/P  AVR (aortic valve replacement)   CAD (coronary artery disease)   DM (diabetes mellitus)   79 year old with mechanical aortic valve, anemia, acute blood loss, CAD post PCI, diabetes  1. Mechanical aortic valve-currently on IV heparin. Resume Coumadin when able from GI perspective. Goal INR 2-3.  2. Blood loss-nuclear medicine study as above. No active bleeding. Per GI.  Discharge plans soon. It would be reasonable to resume Coumadin at home dosage without bridge and allow her INR to become therapeutic over the next 2 days. Per guidelines, there is allowances of up to 7 days without anticoagulation in the setting of mechanical aortic valve. Also, Lovenox bridge would put her at increased risk for rebleeding from a gastrointestinal standpoint.  Close followup in Coumadin clinic.  Tesla Bochicchio, [place] 02/14/2013, 9:01 AM

## 2013-02-15 DIAGNOSIS — I2510 Atherosclerotic heart disease of native coronary artery without angina pectoris: Secondary | ICD-10-CM | POA: Diagnosis not present

## 2013-02-15 DIAGNOSIS — Z7901 Long term (current) use of anticoagulants: Secondary | ICD-10-CM | POA: Diagnosis not present

## 2013-02-15 DIAGNOSIS — D62 Acute posthemorrhagic anemia: Secondary | ICD-10-CM | POA: Diagnosis not present

## 2013-02-15 DIAGNOSIS — I509 Heart failure, unspecified: Secondary | ICD-10-CM | POA: Diagnosis not present

## 2013-02-15 DIAGNOSIS — Z954 Presence of other heart-valve replacement: Secondary | ICD-10-CM | POA: Diagnosis not present

## 2013-02-15 LAB — CBC
HCT: 30.9 % — ABNORMAL LOW (ref 36.0–46.0)
Hemoglobin: 10.7 g/dL — ABNORMAL LOW (ref 12.0–15.0)
MCH: 31.1 pg (ref 26.0–34.0)
MCHC: 34.6 g/dL (ref 30.0–36.0)
RDW: 14.8 % (ref 11.5–15.5)

## 2013-02-15 LAB — PROTIME-INR: INR: 1.25 (ref 0.00–1.49)

## 2013-02-15 LAB — HEPARIN LEVEL (UNFRACTIONATED): Heparin Unfractionated: 0.36 IU/mL (ref 0.30–0.70)

## 2013-02-15 MED ORDER — OMEPRAZOLE 20 MG PO CPDR: 20.0000 mg | DELAYED_RELEASE_CAPSULE | Freq: Every day | ORAL | Status: DC

## 2013-02-15 MED ORDER — WARFARIN SODIUM 5 MG PO TABS
5.0000 mg | ORAL_TABLET | Freq: Once | ORAL | Status: DC
Start: 2013-02-15 — End: 2013-02-15
  Filled 2013-02-15: qty 1

## 2013-02-15 NOTE — Progress Notes (Signed)
SUBJECTIVE:  Back to normal  No CP or SHOB.    OBJECTIVE:   Vitals:   Filed Vitals:   02/14/13 1035 02/14/13 1844 02/14/13 2135 02/15/13 0422  BP: 142/66 133/59 149/53 134/48  Pulse: 59 81 72 71  Temp: 98 F (36.7 C) 98.7 F (37.1 C) 97.7 F (36.5 C) 98 F (36.7 C)  TempSrc: Oral Oral Oral Oral  Resp: 16 16 20 20   Height:      Weight:    67.7 kg (149 lb 4 oz)  SpO2: 97% 97% 98% 98%   I&O's:   Intake/Output Summary (Last 24 hours) at 02/15/13 0859 Last data filed at 02/14/13 1700  Gross per 24 hour  Intake    960 ml  Output      0 ml  Net    960 ml        PHYSICAL EXAM General: Well developed, well nourished, in no acute distress Head:    Normal cephalic and atramatic  Lungs:   Clear bilaterally to auscultation and percussion. Heart:  HRRR S1 crisp S2 click 2/6 systolic murmur  Extremities:  No edema.   Neuro: Alert and oriented X 3. Psych:  Good affect, responds appropriately   LABS: Basic Metabolic Panel:  Recent Labs  02/14/13 0235  NA 135  K 4.4  CL 105  CO2 22  GLUCOSE 132*  BUN 12  CREATININE 1.30*  CALCIUM 8.8   Liver Function Tests: No results found for this basename: AST, ALT, ALKPHOS, BILITOT, PROT, ALBUMIN,  in the last 72 hours No results found for this basename: LIPASE, AMYLASE,  in the last 72 hours CBC:  Recent Labs  02/13/13 0236  02/14/13 0953 02/15/13 0412  WBC 6.2  --   --  5.3  HGB 10.7*  < > 10.3* 10.7*  HCT 31.3*  < > 30.5* 30.9*  MCV 89.7  --   --  89.8  PLT 166  --   --  170  < > = values in this interval not displayed. Cardiac Enzymes: No results found for this basename: CKTOTAL, CKMB, CKMBINDEX, TROPONINI,  in the last 72 hours BNP: No components found with this basename: POCBNP,  D-Dimer: No results found for this basename: DDIMER,  in the last 72 hours Hemoglobin A1C: No results found for this basename: HGBA1C,  in the last 72 hours Fasting Lipid Panel: No results found for this basename: CHOL, HDL, LDLCALC,  TRIG, CHOLHDL, LDLDIRECT,  in the last 72 hours Thyroid Function Tests: No results found for this basename: TSH, T4TOTAL, FREET3, T3FREE, THYROIDAB,  in the last 72 hours Anemia Panel: No results found for this basename: VITAMINB12, FOLATE, FERRITIN, TIBC, IRON, RETICCTPCT,  in the last 72 hours Coag Panel:   Lab Results  Component Value Date   INR 1.25 02/15/2013   INR 1.57* 02/13/2013   INR 1.77* 02/12/2013    RADIOLOGY: Nm Gi Blood Loss  02/13/2013   *RADIOLOGY REPORT*  Clinical Data: Light red blood in stool.  Large amount of fresh blood in the colon at recent colonoscopy with no bleeding site identified.  NUCLEAR MEDICINE GASTROINTESTINAL BLEEDING STUDY  Technique:  Sequential abdominal images were obtained following intravenous administration of Tc-43m labeled red blood cells.  Radiopharmaceutical: 25MILLI CURIE ULTRATAG TECHNETIUM TC 50M- LABELED RED BLOOD CELLS IV KIT  Comparison: Recent endoscopy and colonoscopy reports.  Findings: Normal visualization of intra-arterial tracer, liver, spleen and urinary bladder.  No gastrointestinal activity seen up to 2 hours after injection.  IMPRESSION:  No active gastrointestinal bleeding.   Original Report Authenticated By: Claudie Revering, M.D.      ASSESSMENT: CAD, AVR, GI bleed  PLAN:  Hbg stable.  No bleeding on NM scan.  No angina or SHOB.  Tolerated heparin from bleeding standpoint.  In the past, she has been very sensitive to COumadin.  She had a large bleed several years ago with Lovenox bridge post cath.  No need for Lovenox bridge at this time.  SHe will likely be therapeutic in the next day or so.  Will have her f/u in coumadin clinic later this week.  Target INR 2.0-2.5 given GI bleed issues.  OK to d/c on home dose of coumadin.   Jettie Booze., MD  02/15/2013  8:59 AM

## 2013-02-15 NOTE — Discharge Summary (Signed)
Physician Discharge Summary  [name] [id number] DOB: 07/27/1933 DOA: 02/11/2013  PCP: Simona Huh, MD  Admit date: 02/11/2013 Discharge date: 02/15/2013  Time spent: 35 minutes  Recommendations for Outpatient Follow-up:  1. PT/INR in 2 days 2. 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 Her Hb was being monitored by her PCP and was transfused 1 unit PRBC for Hb of 9.6 on 5/9, on repeat check her Hb drifted back down to 9.6 range a week after the transfusion. She was seen at [facility] office on the day of admission and referred to the hospital.   Hospital Course:  1. Acute blood loss anemia:  - suspect related to AVMs and Coagulopathy from coumadin - subacute gastrointestinal bleeding  After admission we did hold coumadin and let INR drift down, once <2 we did bridge with IV heparin without bolus starting on 5/23.  Transfused 2 units PRBC on 5/23.  The patient was placed on-PPI from admission  -EGD, colonoscopy and NMTRBC were done on 5/25 without a source of bleeding identified.  Hemoglobin was carefully monitored and has remained stable post transfusion.  Advanced  diet  To regular without recurrent bleeding noted.  2. CAD/PCI  -stable . Asymptomatic.  -continue metoprolol and  aspirin  3. S/p Mechanical AVR  -hold coumadin as noted above and bridge with heparin  -Appreciate consult from dr.Varanasi on the day of admission, ok to hold anticoagulation for upto 7 days with this valve per dr.Varanasi.  4. DM: hold oral hypoglycemics, SSI in hospital. Resume amaryl at discharge  5. CHF syndrome - with LE edema - given iv lasix on 5/26. Resume po lasix at discharge    Consultants:  Gastroenterology - [facility]  Cardiology - [facility] Procedures:  Transfusion of 2 units of PRBCS 5/23  EGD 5/25  Colonoscopy 5/25  NMTRBC study 5/25   Discharge Exam: Filed Vitals:   02/14/13 1035 02/14/13 1844 02/14/13 2135 02/15/13 0422  BP: 142/66 133/59 149/53 134/48  Pulse: 59 81 72 71  Temp: 98 F (36.7 C) 98.7 F (37.1 C) 97.7 F (36.5 C) 98 F (36.7 C)  TempSrc: Oral Oral Oral Oral  Resp: 16 16 20 20   Height:      Weight:    67.7 kg (149 lb 4 oz)  SpO2: 97% 97% 98% 98%    General: axox3 Cardiovascular: rrr, metalic click, 3/6 systolic murmur Respiratory: ctab  LE with edema   Discharge Instructions     Medication List    TAKE these medications       aspirin 81 MG tablet  Take 81 mg by mouth daily.     digoxin 0.125 MG tablet  Commonly known as:  LANOXIN  Take 0.125  mg by mouth daily.     EYLEA 2 MG/0.05ML Soln  Generic drug:  Aflibercept  Inject 1 application into the eye every 30 (thirty) days.     ezetimibe-simvastatin 10-40 MG per tablet  Commonly known as:  VYTORIN  Take 1 tablet by mouth every evening.     folic acid [id number] MCG tablet  Commonly known as:  FOLVITE  Take 400 mcg by mouth every evening.     furosemide 80 MG tablet  Commonly known as:  LASIX  Take 80 mg by mouth 2 (two) times daily.     glimepiride 2 MG tablet  Commonly known as:  AMARYL  Take 2 mg by mouth daily before breakfast.     isosorbide mononitrate 60 MG 24 hr tablet  Commonly known as:  IMDUR  Take 60 mg by mouth daily.     metoprolol 50 MG tablet  Commonly  known as:  LOPRESSOR  Take 50 mg by mouth 2 (two) times daily.     multivitamin with minerals Tabs  Take 1 tablet by mouth daily.     nitroGLYCERIN 0.4 MG/SPRAY spray  Commonly known as:  NITROLINGUAL  Place 1 spray under the tongue every 5 (five) minutes as needed. For chest pain     Omega-3 Fatty Acids 1200 MG Caps  Take 3 capsules by mouth 2 (two) times daily.     omeprazole 20 MG capsule  Commonly known as:  PRILOSEC  Take 1 capsule (20 mg total) by mouth daily.     potassium chloride 10 MEQ tablet  Commonly known as:  K-DUR,KLOR-CON  Take 20 mEq by mouth 2 (two) times daily.     TANDEM PLUS PO  Take 1 tablet by mouth 2 (two) times daily.     tobramycin 0.3 % ophthalmic solution  Commonly known as:  TOBREX  Place 1 drop into the left eye See admin instructions. For 3 days, four times a day after eylea injection     valsartan 80 MG tablet  Commonly known as:  DIOVAN  Take 80 mg by mouth daily.     warfarin 5 MG tablet  Commonly known as:  COUMADIN  Take 2.5-5 mg by mouth daily. 2.5mg  Monday, Wednesday, Friday; 5mg  the rest of the week       Allergies  Allergen Reactions  . Penicillins Rash   Follow-up Information   Schedule an appointment as soon as possible for a visit with [name]., MD. (for PT / INR)    Contact information:   [address] [place] [address] [phone]        The results of significant diagnostics from this hospitalization (including imaging, microbiology, ancillary and laboratory) are listed below for reference.    Significant Diagnostic Studies: Nm Gi Blood Loss  02/13/2013   *RADIOLOGY REPORT*  Clinical Data: Light red blood in stool.  Large amount of fresh blood in the colon at recent colonoscopy with no bleeding site identified.  NUCLEAR MEDICINE GASTROINTESTINAL BLEEDING STUDY  Technique:  Sequential abdominal images were obtained following intravenous administration of Tc-65m labeled red blood  cells.  Radiopharmaceutical: 25MILLI CURIE ULTRATAG TECHNETIUM TC 53M- LABELED RED BLOOD CELLS IV KIT  Comparison: Recent endoscopy and colonoscopy reports.  Findings: Normal visualization of intra-arterial tracer, liver, spleen and urinary bladder.  No gastrointestinal activity seen up to 2 hours after injection.  IMPRESSION: No active gastrointestinal bleeding.   Original Report Authenticated By: Claudie Revering, M.D.    Microbiology: No results  found for this or any previous visit (from the past 240 hour(s)).   Labs: Basic Metabolic Panel:  Recent Labs Lab 02/11/13 1447 02/12/13 0530 02/14/13 0235  NA 137 137 135  K 4.6 4.4 4.4  CL 101 104 105  CO2 26 22 22   GLUCOSE 255* 141* 132*  BUN 25* 19 12  CREATININE 1.22* 1.19* 1.30*  CALCIUM 9.2 8.9 8.8   Liver Function Tests:  Recent Labs Lab 02/11/13 1447  AST 31  ALT 36*  ALKPHOS 48  BILITOT 0.6  PROT 6.4  ALBUMIN 3.5   No results found for this basename: LIPASE, AMYLASE,  in the last 168 hours No results found for this basename: AMMONIA,  in the last 168 hours CBC:  Recent Labs Lab 02/11/13 1447 02/12/13 0530 02/12/13 1512 02/13/13 0236 02/13/13 1415 02/13/13 1820 02/14/13 0235 02/14/13 0953 02/15/13 0412  WBC 5.2 7.3 7.0 6.2  --   --   --   --  5.3  HGB 8.6* 10.8* 11.5* 10.7* 10.6* 10.6* 10.2* 10.3* 10.7*  HCT 25.3* 31.2* 32.4* 31.3* 31.2* 30.4* 29.8* 30.5* 30.9*  MCV 92.0 89.4 89.0 89.7  --   --   --   --  89.8  PLT 217 167 165 166  --   --   --   --  170   Cardiac Enzymes: No results found for this basename: CKTOTAL, CKMB, CKMBINDEX, TROPONINI,  in the last 168 hours BNP: BNP (last 3 results) No results found for this basename: PROBNP,  in the last 8760 hours CBG:  Recent Labs Lab 02/14/13 0744 02/14/13 1157 02/14/13 1708 02/14/13 2224 02/15/13 0734  GLUCAP 127* 124* 114* 127* 148*       Signed:  Riely Oetken  [facility] 02/15/2013, 9:46 AM

## 2013-02-15 NOTE — Progress Notes (Signed)
ANTICOAGULATION CONSULT NOTE - Follow Up Consult  Pharmacy Consult for Coumadin Indication: Mechanical AVR (St. Judes)  Allergies  Allergen Reactions  . Penicillins Rash    Patient Measurements: Height: 5' (152.4 cm) Weight: 149 lb 4 oz (67.7 kg) IBW/kg (Calculated) : 45.5 Heparin Dosing Weight: 59.8kg  Vital Signs: Temp: 98 F (36.7 C) (05/27 0422) Temp src: Oral (05/27 0422) BP: 134/48 mmHg (05/27 0422) Pulse Rate: 71 (05/27 0422)  Labs:  Recent Labs  02/12/13 1512  02/13/13 0236  02/14/13 0235 02/14/13 0700 02/14/13 0953 02/14/13 1611 02/15/13 0412  HGB 11.5*  --  10.7*  < > 10.2*  --  10.3*  --  10.7*  HCT 32.4*  --  31.3*  < > 29.8*  --  30.5*  --  30.9*  PLT 165  --  166  --   --   --   --   --  170  LABPROT  --   --  18.3*  --   --   --   --   --  15.5*  INR  --   --  1.57*  --   --   --   --   --  1.25  HEPARINUNFRC  --   < > 0.43  < >  --  0.22*  --  0.19* 0.36  CREATININE  --   --   --   --  1.30*  --   --   --   --   < > = values in this interval not displayed.  Estimated Creatinine Clearance: 31.1 ml/min (by C-G formula based on Cr of 1.3).    Assessment: 39 y/oF who was has been on heparin for mechanical AVR (St. Jude's), d/c'd this am with plans to d/c home soon.  On Coumadin PTA but pt now with GI bleed, aiming for lower goal of 2-2.5. INR subtherapeutic and trend down.  Goal of Therapy:  INR 2.0-2.5 d/t h/o GIB Monitor platelets by anticoagulation protocol: Yes   Plan:  Repeat coumadin 5mg  today and f/u daily protime.  Davonna Belling, PharmD, BCPS Pager [phone]  02/15/2013 9:49 AM

## 2013-02-16 ENCOUNTER — Encounter (HOSPITAL_COMMUNITY): Payer: Self-pay | Admitting: Internal Medicine

## 2013-02-18 DIAGNOSIS — Z7901 Long term (current) use of anticoagulants: Secondary | ICD-10-CM | POA: Diagnosis not present

## 2013-02-18 DIAGNOSIS — Z954 Presence of other heart-valve replacement: Secondary | ICD-10-CM | POA: Diagnosis not present

## 2013-02-22 DIAGNOSIS — Z954 Presence of other heart-valve replacement: Secondary | ICD-10-CM | POA: Diagnosis not present

## 2013-02-22 DIAGNOSIS — Z7901 Long term (current) use of anticoagulants: Secondary | ICD-10-CM | POA: Diagnosis not present

## 2013-02-25 DIAGNOSIS — Z7901 Long term (current) use of anticoagulants: Secondary | ICD-10-CM | POA: Diagnosis not present

## 2013-02-25 DIAGNOSIS — Z954 Presence of other heart-valve replacement: Secondary | ICD-10-CM | POA: Diagnosis not present

## 2013-02-25 DIAGNOSIS — D649 Anemia, unspecified: Secondary | ICD-10-CM | POA: Diagnosis not present

## 2013-03-04 DIAGNOSIS — Z954 Presence of other heart-valve replacement: Secondary | ICD-10-CM | POA: Diagnosis not present

## 2013-03-04 DIAGNOSIS — Z7901 Long term (current) use of anticoagulants: Secondary | ICD-10-CM | POA: Diagnosis not present

## 2013-03-11 DIAGNOSIS — H353190 Nonexudative age-related macular degeneration, unspecified eye, stage unspecified: Secondary | ICD-10-CM | POA: Diagnosis not present

## 2013-03-11 DIAGNOSIS — Z7901 Long term (current) use of anticoagulants: Secondary | ICD-10-CM | POA: Diagnosis not present

## 2013-03-11 DIAGNOSIS — Z954 Presence of other heart-valve replacement: Secondary | ICD-10-CM | POA: Diagnosis not present

## 2013-03-11 DIAGNOSIS — E11329 Type 2 diabetes mellitus with mild nonproliferative diabetic retinopathy without macular edema: Secondary | ICD-10-CM | POA: Diagnosis not present

## 2013-03-11 DIAGNOSIS — H353290 Exudative age-related macular degeneration, unspecified eye, stage unspecified: Secondary | ICD-10-CM | POA: Diagnosis not present

## 2013-03-11 DIAGNOSIS — H35059 Retinal neovascularization, unspecified, unspecified eye: Secondary | ICD-10-CM | POA: Diagnosis not present

## 2013-03-11 DIAGNOSIS — D649 Anemia, unspecified: Secondary | ICD-10-CM | POA: Diagnosis not present

## 2013-03-23 DIAGNOSIS — R0989 Other specified symptoms and signs involving the circulatory and respiratory systems: Secondary | ICD-10-CM | POA: Diagnosis not present

## 2013-03-23 DIAGNOSIS — Z7901 Long term (current) use of anticoagulants: Secondary | ICD-10-CM | POA: Diagnosis not present

## 2013-03-23 DIAGNOSIS — Z954 Presence of other heart-valve replacement: Secondary | ICD-10-CM | POA: Diagnosis not present

## 2013-04-14 DIAGNOSIS — Z7901 Long term (current) use of anticoagulants: Secondary | ICD-10-CM | POA: Diagnosis not present

## 2013-04-14 DIAGNOSIS — Z954 Presence of other heart-valve replacement: Secondary | ICD-10-CM | POA: Diagnosis not present

## 2013-04-15 DIAGNOSIS — H35059 Retinal neovascularization, unspecified, unspecified eye: Secondary | ICD-10-CM | POA: Diagnosis not present

## 2013-04-15 DIAGNOSIS — H353190 Nonexudative age-related macular degeneration, unspecified eye, stage unspecified: Secondary | ICD-10-CM | POA: Diagnosis not present

## 2013-04-15 DIAGNOSIS — E11329 Type 2 diabetes mellitus with mild nonproliferative diabetic retinopathy without macular edema: Secondary | ICD-10-CM | POA: Diagnosis not present

## 2013-04-15 DIAGNOSIS — H353290 Exudative age-related macular degeneration, unspecified eye, stage unspecified: Secondary | ICD-10-CM | POA: Diagnosis not present

## 2013-04-15 DIAGNOSIS — E1165 Type 2 diabetes mellitus with hyperglycemia: Secondary | ICD-10-CM | POA: Diagnosis not present

## 2013-04-15 DIAGNOSIS — N289 Disorder of kidney and ureter, unspecified: Secondary | ICD-10-CM | POA: Diagnosis not present

## 2013-04-21 DIAGNOSIS — Z0189 Encounter for other specified special examinations: Secondary | ICD-10-CM | POA: Diagnosis not present

## 2013-04-21 DIAGNOSIS — N184 Chronic kidney disease, stage 4 (severe): Secondary | ICD-10-CM | POA: Diagnosis not present

## 2013-04-27 ENCOUNTER — Other Ambulatory Visit: Payer: Self-pay

## 2013-04-28 ENCOUNTER — Other Ambulatory Visit: Payer: Self-pay

## 2013-04-28 DIAGNOSIS — Z1231 Encounter for screening mammogram for malignant neoplasm of breast: Secondary | ICD-10-CM

## 2013-04-29 DIAGNOSIS — Z954 Presence of other heart-valve replacement: Secondary | ICD-10-CM | POA: Diagnosis not present

## 2013-04-29 DIAGNOSIS — Z7901 Long term (current) use of anticoagulants: Secondary | ICD-10-CM | POA: Diagnosis not present

## 2013-05-10 DIAGNOSIS — Z7901 Long term (current) use of anticoagulants: Secondary | ICD-10-CM | POA: Diagnosis not present

## 2013-05-10 DIAGNOSIS — Z954 Presence of other heart-valve replacement: Secondary | ICD-10-CM | POA: Diagnosis not present

## 2013-05-11 ENCOUNTER — Ambulatory Visit
Admission: RE | Admit: 2013-05-11 | Discharge: 2013-05-11 | Disposition: A | Discharge status: Home / Self Care | Payer: Medicare Other | Source: Ambulatory Visit

## 2013-05-11 DIAGNOSIS — Z1231 Encounter for screening mammogram for malignant neoplasm of breast: Secondary | ICD-10-CM | POA: Diagnosis not present

## 2013-05-17 DIAGNOSIS — Z954 Presence of other heart-valve replacement: Secondary | ICD-10-CM | POA: Diagnosis not present

## 2013-05-17 DIAGNOSIS — Z7901 Long term (current) use of anticoagulants: Secondary | ICD-10-CM | POA: Diagnosis not present

## 2013-05-27 DIAGNOSIS — E11329 Type 2 diabetes mellitus with mild nonproliferative diabetic retinopathy without macular edema: Secondary | ICD-10-CM | POA: Diagnosis not present

## 2013-05-27 DIAGNOSIS — H353190 Nonexudative age-related macular degeneration, unspecified eye, stage unspecified: Secondary | ICD-10-CM | POA: Diagnosis not present

## 2013-05-27 DIAGNOSIS — H353290 Exudative age-related macular degeneration, unspecified eye, stage unspecified: Secondary | ICD-10-CM | POA: Diagnosis not present

## 2013-05-27 DIAGNOSIS — H35059 Retinal neovascularization, unspecified, unspecified eye: Secondary | ICD-10-CM | POA: Diagnosis not present

## 2013-05-31 DIAGNOSIS — Z79899 Other long term (current) drug therapy: Secondary | ICD-10-CM | POA: Diagnosis not present

## 2013-05-31 DIAGNOSIS — Z23 Encounter for immunization: Secondary | ICD-10-CM | POA: Diagnosis not present

## 2013-05-31 DIAGNOSIS — Z7901 Long term (current) use of anticoagulants: Secondary | ICD-10-CM | POA: Diagnosis not present

## 2013-05-31 DIAGNOSIS — E7800 Pure hypercholesterolemia, unspecified: Secondary | ICD-10-CM | POA: Diagnosis not present

## 2013-05-31 DIAGNOSIS — Z954 Presence of other heart-valve replacement: Secondary | ICD-10-CM | POA: Diagnosis not present

## 2013-06-11 ENCOUNTER — Other Ambulatory Visit: Payer: Self-pay | Admitting: Interventional Cardiology

## 2013-06-11 DIAGNOSIS — Z79899 Other long term (current) drug therapy: Secondary | ICD-10-CM

## 2013-06-11 DIAGNOSIS — E7800 Pure hypercholesterolemia, unspecified: Secondary | ICD-10-CM

## 2013-06-21 DIAGNOSIS — Z954 Presence of other heart-valve replacement: Secondary | ICD-10-CM | POA: Diagnosis not present

## 2013-06-21 DIAGNOSIS — Z7901 Long term (current) use of anticoagulants: Secondary | ICD-10-CM | POA: Diagnosis not present

## 2013-07-12 ENCOUNTER — Ambulatory Visit (INDEPENDENT_AMBULATORY_CARE_PROVIDER_SITE_OTHER): Payer: Medicare Other | Admitting: Pharmacist

## 2013-07-12 DIAGNOSIS — Z954 Presence of other heart-valve replacement: Secondary | ICD-10-CM | POA: Insufficient documentation

## 2013-07-12 DIAGNOSIS — I359 Nonrheumatic aortic valve disorder, unspecified: Secondary | ICD-10-CM

## 2013-07-12 LAB — POCT INR: INR: 2

## 2013-07-20 DIAGNOSIS — D649 Anemia, unspecified: Secondary | ICD-10-CM | POA: Diagnosis not present

## 2013-07-20 DIAGNOSIS — E1129 Type 2 diabetes mellitus with other diabetic kidney complication: Secondary | ICD-10-CM | POA: Diagnosis not present

## 2013-07-20 DIAGNOSIS — E1139 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: Secondary | ICD-10-CM | POA: Diagnosis not present

## 2013-07-20 DIAGNOSIS — E11359 Type 2 diabetes mellitus with proliferative diabetic retinopathy without macular edema: Secondary | ICD-10-CM | POA: Diagnosis not present

## 2013-07-20 DIAGNOSIS — I509 Heart failure, unspecified: Secondary | ICD-10-CM | POA: Diagnosis not present

## 2013-07-20 DIAGNOSIS — E7800 Pure hypercholesterolemia, unspecified: Secondary | ICD-10-CM | POA: Diagnosis not present

## 2013-07-20 DIAGNOSIS — R809 Proteinuria, unspecified: Secondary | ICD-10-CM | POA: Diagnosis not present

## 2013-07-20 DIAGNOSIS — I130 Hypertensive heart and chronic kidney disease with heart failure and stage 1 through stage 4 chronic kidney disease, or unspecified chronic kidney disease: Secondary | ICD-10-CM | POA: Diagnosis not present

## 2013-07-22 DIAGNOSIS — H353190 Nonexudative age-related macular degeneration, unspecified eye, stage unspecified: Secondary | ICD-10-CM | POA: Diagnosis not present

## 2013-07-22 DIAGNOSIS — H353290 Exudative age-related macular degeneration, unspecified eye, stage unspecified: Secondary | ICD-10-CM | POA: Diagnosis not present

## 2013-07-28 ENCOUNTER — Other Ambulatory Visit: Payer: Self-pay

## 2013-08-02 ENCOUNTER — Ambulatory Visit (INDEPENDENT_AMBULATORY_CARE_PROVIDER_SITE_OTHER): Payer: Medicare Other | Admitting: Pharmacist

## 2013-08-02 DIAGNOSIS — I359 Nonrheumatic aortic valve disorder, unspecified: Secondary | ICD-10-CM

## 2013-08-02 DIAGNOSIS — Z954 Presence of other heart-valve replacement: Secondary | ICD-10-CM | POA: Diagnosis not present

## 2013-08-02 LAB — POCT INR: INR: 2.1

## 2013-08-04 ENCOUNTER — Encounter: Payer: Self-pay | Admitting: Interventional Cardiology

## 2013-08-10 ENCOUNTER — Encounter: Payer: Self-pay | Admitting: *Deleted

## 2013-08-10 ENCOUNTER — Encounter: Payer: Medicare Other | Attending: Family Medicine | Admitting: *Deleted

## 2013-08-10 VITALS — Ht 60.0 in | Wt 147.0 lb

## 2013-08-10 DIAGNOSIS — E119 Type 2 diabetes mellitus without complications: Secondary | ICD-10-CM | POA: Insufficient documentation

## 2013-08-10 DIAGNOSIS — Z713 Dietary counseling and surveillance: Secondary | ICD-10-CM | POA: Diagnosis not present

## 2013-08-10 NOTE — Progress Notes (Signed)
Appt start time: 1130 end time:  1300.   Assessment:  Patient was seen on  08/10/13 for individual diabetes education. Ms. Romans comes for DSME r/t T2DM. She was diagnosed approximately 18 years ago. At that time no formal education was offered. Advised to stay away from the sweets. Her husband is here with her yet prefers to wait in the waiting room. Both  She and her husband are retired. They both do the grocery shopping. Either she does the cooking or they eat out approximately 5 meals per week. She is a member of [employer] and cooks in accordance to [employer] watcher's guidelines. Her hope is to keep from having to go on insulin. Dr. Marisue Humble encourages her efforts yet has verbalized that he doubts her ability to avoid the insulin. Presently testing once per week. Last test was 08/09/13 FBS 268 mg/dl. She had an Aunt pass from complications of [id number] and her mother was pre-Diabetic.  Current HbA1c: 12.2% this has been gradually increasing since she was taken of Metformin (r/t kidney function) over the past 12 months.  Preferred Learning Style:   Auditory  Learning Readiness:   Ready  Change in progress  MEDICATIONS: See List: Januvia, Glimepiride  DIETARY INTAKE: She presents with a three day food diary  Diet  recall:   Approximately 229g CHO B ( AM): McDonald Bacon, egg cheese biscuit, OJ,           68g CHO  Snk ( AM): cookie water  L ( PM): Lean Cuisine 5 cheese rigatoni          56g CHO Snk ( PM): cookie, orange hard butterscotch candy  30+g CHO D ( PM): mashed potatoes/gravy, flank steak, zucchini parmessan           60g CHO Snk ( PM): 12 grapes 15gCHO Beverages: water, diet ice tea,   Usual physical activity: Goes to [employer] 2-3X per week. For approx 60 minutes each visit with a potential for 180 mins per week..  Progress Towards Goal(s):  In progress.    Intervention:  Nutrition counseling provided.  Discussed diabetes disease process and treatment options.  Discussed  physiology of diabetes and role of obesity on insulin resistance.  Encouraged moderate weight reduction to improve glucose levels.  Discussed role of medications and diet in glucose control  Provided education on macronutrients on glucose levels.  Provided education on carb counting, importance of regularly scheduled meals/snacks, and meal planning  Discussed effects of physical activity on glucose levels and long-term glucose control.  Recommended 150 minutes of physical activity/week.  Reviewed patient medications.  Discussed role of medication on blood glucose and possible side effects  Discussed blood glucose monitoring and interpretation.  Discussed recommended target ranges and individual ranges.    Described short-term complications: hyper- and hypo-glycemia.  Discussed causes,symptoms, and treatment options.  Discussed prevention, detection, and treatment of long-term complications.  Discussed the role of prolonged elevated glucose levels on body systems.  Discussed role of stress on blood glucose levels and discussed strategies to manage psychosocial issues.  Discussed recommendations for long-term diabetes self-care.  Established checklist for medical, dental, and emotional self-care.  Plan:  Aim for 3 Carb Choices per meal (45 grams) +/- 1 either way  Aim for 0-1 Carbs per snack if hungry  Consider reading food labels for Total Carbohydrate and Fat Grams of foods Consider  increasing your activity level by walking for 30 minutes daily as tolerated Consider checking BG at alternate times per day to  include FBS and 2hpp on alternating days as directed by MD  Continue taking medication  as directed by MD  Teaching Method Utilized:  Visual Auditory Hands on  Handouts given during visit include: Living Well with Diabetes Carb Counting and Food Label handouts Meal Plan Card Plate method  Snack sheet  Barriers to learning/adherence to lifestyle change:  motivation  Diabetes self-care support plan:   [facility] support group  Primary Care Physician  Demonstrated degree of understanding via:  Teach Back   Monitoring/Evaluation:  Dietary intake, exercise, Monitor glucose, and body weight, return prn.

## 2013-08-12 ENCOUNTER — Encounter: Payer: Self-pay | Admitting: *Deleted

## 2013-08-12 NOTE — Patient Instructions (Signed)
Plan:  Aim for 3 Carb Choices per meal (45 grams) +/- 1 either way  Aim for 0-1 Carbs per snack if hungry  Consider reading food labels for Total Carbohydrate and Fat Grams of foods Consider  increasing your activity level by walking for 30 minutes daily as tolerated Consider checking BG at alternate times per day to include FBS and 2hpp on alternating days as directed by MD  Continue taking medication  as directed by MD

## 2013-08-23 ENCOUNTER — Ambulatory Visit (INDEPENDENT_AMBULATORY_CARE_PROVIDER_SITE_OTHER): Payer: Medicare Other | Admitting: Pharmacist

## 2013-08-23 DIAGNOSIS — I359 Nonrheumatic aortic valve disorder, unspecified: Secondary | ICD-10-CM | POA: Diagnosis not present

## 2013-08-23 DIAGNOSIS — Z954 Presence of other heart-valve replacement: Secondary | ICD-10-CM | POA: Diagnosis not present

## 2013-08-23 LAB — POCT INR: INR: 1.9

## 2013-09-01 ENCOUNTER — Telehealth: Payer: Self-pay | Admitting: *Deleted

## 2013-09-01 ENCOUNTER — Encounter: Payer: Self-pay | Admitting: Physician Assistant

## 2013-09-01 NOTE — Telephone Encounter (Signed)
Spoke with patient and explained to her that we need documentation of her immunizations and then we can update her information in EPIC. She will have her PCP sent [place] this information.

## 2013-09-06 ENCOUNTER — Ambulatory Visit (INDEPENDENT_AMBULATORY_CARE_PROVIDER_SITE_OTHER): Payer: Medicare Other

## 2013-09-06 DIAGNOSIS — Z954 Presence of other heart-valve replacement: Secondary | ICD-10-CM

## 2013-09-06 DIAGNOSIS — I359 Nonrheumatic aortic valve disorder, unspecified: Secondary | ICD-10-CM | POA: Diagnosis not present

## 2013-09-13 ENCOUNTER — Ambulatory Visit: Payer: Medicare Other | Admitting: *Deleted

## 2013-09-20 ENCOUNTER — Telehealth: Payer: Self-pay

## 2013-09-20 ENCOUNTER — Ambulatory Visit (INDEPENDENT_AMBULATORY_CARE_PROVIDER_SITE_OTHER): Payer: Medicare Other | Admitting: Pharmacist

## 2013-09-20 DIAGNOSIS — I359 Nonrheumatic aortic valve disorder, unspecified: Secondary | ICD-10-CM

## 2013-09-20 DIAGNOSIS — Z954 Presence of other heart-valve replacement: Secondary | ICD-10-CM

## 2013-09-20 MED ORDER — ATORVASTATIN CALCIUM 80 MG PO TABS: 80.0000 mg | ORAL_TABLET | Freq: Every day | ORAL | Status: DC

## 2013-09-20 MED ORDER — EZETIMIBE 10 MG PO TABS: 10.0000 mg | ORAL_TABLET | Freq: Every day | ORAL | Status: DC

## 2013-09-20 NOTE — Telephone Encounter (Signed)
Refilled

## 2013-09-29 ENCOUNTER — Other Ambulatory Visit: Payer: Self-pay | Admitting: Cardiology

## 2013-09-30 ENCOUNTER — Ambulatory Visit (HOSPITAL_COMMUNITY): Payer: Medicare Other | Attending: Internal Medicine

## 2013-09-30 ENCOUNTER — Encounter: Payer: Self-pay | Admitting: Internal Medicine

## 2013-09-30 DIAGNOSIS — I2510 Atherosclerotic heart disease of native coronary artery without angina pectoris: Secondary | ICD-10-CM | POA: Diagnosis not present

## 2013-09-30 DIAGNOSIS — I10 Essential (primary) hypertension: Secondary | ICD-10-CM | POA: Diagnosis not present

## 2013-09-30 DIAGNOSIS — Z951 Presence of aortocoronary bypass graft: Secondary | ICD-10-CM | POA: Diagnosis not present

## 2013-09-30 DIAGNOSIS — I658 Occlusion and stenosis of other precerebral arteries: Secondary | ICD-10-CM | POA: Insufficient documentation

## 2013-09-30 DIAGNOSIS — Z87891 Personal history of nicotine dependence: Secondary | ICD-10-CM | POA: Diagnosis not present

## 2013-09-30 DIAGNOSIS — E119 Type 2 diabetes mellitus without complications: Secondary | ICD-10-CM | POA: Diagnosis not present

## 2013-09-30 DIAGNOSIS — E785 Hyperlipidemia, unspecified: Secondary | ICD-10-CM | POA: Insufficient documentation

## 2013-09-30 DIAGNOSIS — I6529 Occlusion and stenosis of unspecified carotid artery: Secondary | ICD-10-CM | POA: Diagnosis not present

## 2013-10-11 ENCOUNTER — Ambulatory Visit (INDEPENDENT_AMBULATORY_CARE_PROVIDER_SITE_OTHER): Payer: Medicare Other | Admitting: Pharmacist

## 2013-10-11 DIAGNOSIS — Z954 Presence of other heart-valve replacement: Secondary | ICD-10-CM | POA: Diagnosis not present

## 2013-10-11 DIAGNOSIS — I359 Nonrheumatic aortic valve disorder, unspecified: Secondary | ICD-10-CM

## 2013-10-11 LAB — POCT INR: INR: 2.3

## 2013-10-14 DIAGNOSIS — E11329 Type 2 diabetes mellitus with mild nonproliferative diabetic retinopathy without macular edema: Secondary | ICD-10-CM | POA: Diagnosis not present

## 2013-10-14 DIAGNOSIS — H353190 Nonexudative age-related macular degeneration, unspecified eye, stage unspecified: Secondary | ICD-10-CM | POA: Diagnosis not present

## 2013-10-14 DIAGNOSIS — H353290 Exudative age-related macular degeneration, unspecified eye, stage unspecified: Secondary | ICD-10-CM | POA: Diagnosis not present

## 2013-10-14 DIAGNOSIS — H35059 Retinal neovascularization, unspecified, unspecified eye: Secondary | ICD-10-CM | POA: Diagnosis not present

## 2013-10-25 DIAGNOSIS — E1129 Type 2 diabetes mellitus with other diabetic kidney complication: Secondary | ICD-10-CM | POA: Diagnosis not present

## 2013-10-25 DIAGNOSIS — E1165 Type 2 diabetes mellitus with hyperglycemia: Secondary | ICD-10-CM | POA: Diagnosis not present

## 2013-11-01 ENCOUNTER — Ambulatory Visit (INDEPENDENT_AMBULATORY_CARE_PROVIDER_SITE_OTHER): Payer: Medicare Other | Admitting: *Deleted

## 2013-11-01 DIAGNOSIS — Z5181 Encounter for therapeutic drug level monitoring: Secondary | ICD-10-CM

## 2013-11-01 DIAGNOSIS — Z954 Presence of other heart-valve replacement: Secondary | ICD-10-CM

## 2013-11-01 DIAGNOSIS — Z7901 Long term (current) use of anticoagulants: Secondary | ICD-10-CM | POA: Insufficient documentation

## 2013-11-01 DIAGNOSIS — I359 Nonrheumatic aortic valve disorder, unspecified: Secondary | ICD-10-CM

## 2013-11-01 LAB — POCT INR: INR: 2.2

## 2013-11-22 ENCOUNTER — Ambulatory Visit: Payer: Medicare Other | Admitting: Interventional Cardiology

## 2013-11-28 ENCOUNTER — Encounter: Payer: Self-pay | Admitting: Cardiology

## 2013-11-28 ENCOUNTER — Ambulatory Visit (INDEPENDENT_AMBULATORY_CARE_PROVIDER_SITE_OTHER): Payer: Medicare Other | Admitting: Interventional Cardiology

## 2013-11-28 ENCOUNTER — Other Ambulatory Visit: Payer: Medicare Other

## 2013-11-28 ENCOUNTER — Ambulatory Visit (INDEPENDENT_AMBULATORY_CARE_PROVIDER_SITE_OTHER): Payer: Medicare Other | Admitting: Pharmacist

## 2013-11-28 ENCOUNTER — Encounter: Payer: Self-pay | Admitting: Interventional Cardiology

## 2013-11-28 VITALS — BP 135/52 | HR 55 | Ht 60.0 in | Wt 141.0 lb

## 2013-11-28 DIAGNOSIS — I2510 Atherosclerotic heart disease of native coronary artery without angina pectoris: Secondary | ICD-10-CM | POA: Diagnosis not present

## 2013-11-28 DIAGNOSIS — Z954 Presence of other heart-valve replacement: Secondary | ICD-10-CM

## 2013-11-28 DIAGNOSIS — K922 Gastrointestinal hemorrhage, unspecified: Secondary | ICD-10-CM

## 2013-11-28 DIAGNOSIS — E7800 Pure hypercholesterolemia, unspecified: Secondary | ICD-10-CM | POA: Diagnosis not present

## 2013-11-28 DIAGNOSIS — I359 Nonrheumatic aortic valve disorder, unspecified: Secondary | ICD-10-CM | POA: Diagnosis not present

## 2013-11-28 DIAGNOSIS — R0602 Shortness of breath: Secondary | ICD-10-CM

## 2013-11-28 DIAGNOSIS — I6529 Occlusion and stenosis of unspecified carotid artery: Secondary | ICD-10-CM

## 2013-11-28 DIAGNOSIS — Z952 Presence of prosthetic heart valve: Secondary | ICD-10-CM

## 2013-11-28 DIAGNOSIS — Z79899 Other long term (current) drug therapy: Secondary | ICD-10-CM

## 2013-11-28 DIAGNOSIS — Z5181 Encounter for therapeutic drug level monitoring: Secondary | ICD-10-CM

## 2013-11-28 LAB — BASIC METABOLIC PANEL
BUN: 32 mg/dL — ABNORMAL HIGH (ref 6–23)
CALCIUM: 9.6 mg/dL (ref 8.4–10.5)
CO2: 27 meq/L (ref 19–32)
CREATININE: 1.8 mg/dL — AB (ref 0.4–1.2)
Chloride: 102 mEq/L (ref 96–112)
GFR: 29.28 mL/min — ABNORMAL LOW (ref >60.00)
GLUCOSE: 156 mg/dL — AB (ref 70–99)
Potassium: 4.2 mEq/L (ref 3.5–5.1)
Sodium: 139 mEq/L (ref 135–145)

## 2013-11-28 LAB — HEPATIC FUNCTION PANEL
ALBUMIN: 4.4 g/dL (ref 3.5–5.2)
ALT: 24 U/L (ref 0–35)
AST: 28 U/L (ref 0–37)
Alkaline Phosphatase: 48 U/L (ref 39–117)
Bilirubin, Direct: 0.1 mg/dL (ref 0.0–0.3)
TOTAL PROTEIN: 8.2 g/dL (ref 6.0–8.3)
Total Bilirubin: 1.5 mg/dL — ABNORMAL HIGH (ref 0.3–1.2)

## 2013-11-28 LAB — POCT INR: INR: 1.8

## 2013-11-28 NOTE — Addendum Note (Signed)
Addended by: Eulis Foster on: 11/28/2013 12:27 PM   Modules accepted: Orders

## 2013-11-28 NOTE — Addendum Note (Signed)
Addended by: Eulis Foster on: 11/28/2013 12:28 PM   Modules accepted: Orders

## 2013-11-28 NOTE — Patient Instructions (Signed)
Your physician recommends that you return for lab work TODAY for fasting cholesterol, BNP and BMet.   We will determine follow up after lab results are back.

## 2013-11-28 NOTE — Progress Notes (Signed)
Patient ID: [name], female   DOB: [birth year], 80 y.o.   MRN: [id number]    [address], [place], [place]  [address] Phone: [phone] Fax:  [phone]  Date:  11/28/2013   ID:  [name], [name] Feb 12, 1933, MRN [id number]  PCP:  Simona Huh, MD      History of Present Illness: [name] is a 80 y.o. female who has had AVR and CAD. She has been doing fairly well. She continues to exercise on a regular basis. There are some days when she has some shortness of breath. It is worse right if she is starting on the treadmill. After a few minutes, it improves and she can usually walk for 20 minutes completing one-mile. CAD/ASCVD:  c/o Palpitations rare; feels with walking up hill.  Denies :   Nitroglycerin.  Orthopnea.  Syncope.   Chest tightness and SHOB with walking up an incline.  Relieved with rest.   Wt Readings from Last 3 Encounters:  11/28/13 141 lb (63.957 kg)  08/10/13 147 lb (66.679 kg)  02/15/13 149 lb 4 oz (67.7 kg)     Past Medical History  Diagnosis Date  . Hypertension   . Hyperlipemia   . CHF (congestive heart failure)   . Arteriovenous malformation of gastrointestinal tract   . Heart murmur   . Anginal pain   . Old MI (myocardial infarction)     "a coulple /dr in 02/2008" (02/11/2013)  . Type II diabetes mellitus   . Anemia   . History of blood transfusion     "today and a couple times over the years" (02/11/2013)  . Arthritis     "fingers mostly" (02/11/2013)  . Chronic lower GI bleeding     "today; last time was ~ 8 yr ago; used to have them often before that too" (02/11/2013)  . Chronic kidney disease     stage 3  . Macula lutea degeneration   . Coronary artery disease   . DJD (degenerative joint disease)     Current Outpatient Prescriptions  Medication Sig Dispense Refill  . Aflibercept (EYLEA) 2 MG/0.05ML SOLN Inject 1 application into the eye every 30 (thirty) days.      Marland Kitchen aspirin 81 MG tablet Take 81 mg by mouth  daily.      Marland Kitchen atorvastatin (LIPITOR) 80 MG tablet Take 1 tablet (80 mg total) by mouth daily.  30 tablet  5  . Canagliflozin (INVOKANA) 100 MG TABS Take by mouth daily.      . digoxin (LANOXIN) 0.125 MG tablet Take 0.125 mg by mouth daily.      Marland Kitchen ezetimibe (ZETIA) 10 MG tablet Take 1 tablet (10 mg total) by mouth daily.  30 tablet  5  . FeFum-FePo-FA-B Cmp-C-Zn-Mn-Cu (TANDEM PLUS PO) Take 1 tablet by mouth 2 (two) times daily.      . folic acid (FOLVITE) A999333 MCG tablet Take 400 mcg by mouth every evening.       . furosemide (LASIX) 80 MG tablet Take 80 mg by mouth 2 (two) times daily.      Marland Kitchen glimepiride (AMARYL) 2 MG tablet Take 2 mg by mouth daily before breakfast.      . isosorbide mononitrate (IMDUR) 60 MG 24 hr tablet Take 60 mg by mouth daily.      Marland Kitchen JANUVIA 50 MG tablet Take 50 mg by mouth daily.       . metoprolol (LOPRESSOR) 50 MG tablet Take 50 mg  by mouth 2 (two) times daily.      . Multiple Vitamin (MULTIVITAMIN WITH MINERALS) TABS Take 1 tablet by mouth daily.      . nitroGLYCERIN (NITROSTAT) 0.4 MG SL tablet Place 0.4 mg under the tongue every 5 (five) minutes as needed for chest pain.      . Omega-3 Fatty Acids 1200 MG CAPS Take 3 capsules by mouth 2 (two) times daily.      Marland Kitchen omeprazole (PRILOSEC) 20 MG capsule Take 1 capsule (20 mg total) by mouth daily.  30 capsule  0  . potassium chloride (K-DUR,KLOR-CON) 10 MEQ tablet Take 20 mEq by mouth 2 (two) times daily.       Marland Kitchen tobramycin (TOBREX) 0.3 % ophthalmic solution Place 1 drop into the left eye See admin instructions. For 3 days, four times a day after eylea injection      . valsartan (DIOVAN) 80 MG tablet Take 80 mg by mouth daily.      Marland Kitchen warfarin (COUMADIN) 5 MG tablet Take 2.5-5 mg by mouth daily. 2.5mg  Monday, Wednesday, Friday; 5mg  the rest of the week       No current facility-administered medications for this visit.    Allergies:    Allergies  Allergen Reactions  . Darvon [Propoxyphene] Nausea And Vomiting  .  Lisinopril Cough  . Septra [Sulfamethoxazole-Tmp Ds]     Increased INR  . Penicillins Rash    Social History:  The patient  reports that she has never smoked. She has never used smokeless tobacco. She reports that she does not drink alcohol or use illicit drugs.   Family History:  The patient's family history includes Heart disease in her father and mother.   ROS:  Please see the history of present illness.  No nausea, vomiting.  No fevers, chills.  No focal weakness.  No dysuria.   All other systems reviewed and negative.   PHYSICAL EXAM: VS:  BP 135/52  Pulse 55  Ht 5' (1.524 m)  Wt 141 lb (63.957 kg)  BMI 27.54 kg/m2 Well nourished, well developed, in no acute distress HEENT: normal Neck: no JVD, no carotid bruits Cardiac:  normal S1, crisp S2; RRR; 2/6 systolic murmur Lungs:  clear to auscultation bilaterally, no wheezing, rhonchi or rales Abd: soft, nontender, no hepatomegaly Ext: no edema Skin: warm and dry Neuro:   no focal abnormalities noted  EKG:  Sinus bradycardia, IVCD   ASSESSMENT AND PLAN:  Atrial fibrillation  IMAGING: EKG   [name] 11/23/2012 11:47:19 AM > [name] 11/23/2012 12:11:08 PM > NSR, PRWP., NSST   Notes: Coumadin for stroke prevention. INR target between 2.0-2.5. She has had bleeding complications when her INR approaches 3 or above.  2. Aortic valve disorders  Notes: s/p AVR. SBE prophylaxis. Prosthetic valve appears to be functioning well.  3. Coronary atherosclerosis of autologous vein bypass graft  Notes: Mild angina. Chronic SHOB. Bare metal stents in the RCA. Shortness of breath has gotten worse.  Check BNP. If this is normal, consider increasing isosorbide to 90 mg daily. If antianginal medications do not work, would consider repeat cardiac cath.  Lipids to be checked today.  4. Occlusion and stenosis of carotid artery without mention of cerebral infarction  Notes: Follow serial carotid u/s.    Signed, Mina Marble, MD,  [facility] 11/28/2013 12:07 PM

## 2013-11-29 LAB — NMR LIPOPROFILE WITH LIPIDS
Cholesterol, Total: 146 mg/dL (ref <200)
HDL Particle Number: 29.5 umol/L — ABNORMAL LOW (ref >=30.5)
HDL SIZE: 8.7 nm — AB (ref >=9.2)
HDL-C: 49 mg/dL (ref >=40)
LARGE HDL: 3.8 umol/L — AB (ref >=4.8)
LARGE VLDL-P: 2.7 nmol/L (ref <=2.7)
LDL (calc): 62 mg/dL (ref <100)
LDL Particle Number: 1110 nmol/L — ABNORMAL HIGH (ref <1000)
LDL SIZE: 20.8 nm (ref >20.5)
LP-IR Score: 61 — ABNORMAL HIGH (ref <=45)
SMALL LDL PARTICLE NUMBER: 473 nmol/L (ref <=527)
Triglycerides: 176 mg/dL — ABNORMAL HIGH (ref <150)
VLDL Size: 45.8 nm (ref <=46.6)

## 2013-11-29 LAB — BRAIN NATRIURETIC PEPTIDE: Pro B Natriuretic peptide (BNP): 64 pg/mL (ref 0.0–100.0)

## 2013-11-30 ENCOUNTER — Other Ambulatory Visit: Payer: Self-pay | Admitting: *Deleted

## 2013-11-30 MED ORDER — WARFARIN SODIUM 5 MG PO TABS: ORAL_TABLET | ORAL | Status: DC

## 2013-11-30 MED ORDER — ISOSORBIDE MONONITRATE ER 60 MG PO TB24: 60.0000 mg | ORAL_TABLET | Freq: Every day | ORAL | Status: DC

## 2013-12-01 ENCOUNTER — Other Ambulatory Visit: Payer: Medicare Other

## 2013-12-02 ENCOUNTER — Other Ambulatory Visit: Payer: Self-pay | Admitting: Cardiology

## 2013-12-02 ENCOUNTER — Encounter: Payer: Self-pay | Admitting: Cardiology

## 2013-12-02 DIAGNOSIS — I2510 Atherosclerotic heart disease of native coronary artery without angina pectoris: Secondary | ICD-10-CM

## 2013-12-13 ENCOUNTER — Ambulatory Visit (INDEPENDENT_AMBULATORY_CARE_PROVIDER_SITE_OTHER): Payer: Medicare Other | Admitting: Pharmacist

## 2013-12-13 DIAGNOSIS — Z5181 Encounter for therapeutic drug level monitoring: Secondary | ICD-10-CM | POA: Diagnosis not present

## 2013-12-13 DIAGNOSIS — I359 Nonrheumatic aortic valve disorder, unspecified: Secondary | ICD-10-CM

## 2013-12-13 DIAGNOSIS — Z954 Presence of other heart-valve replacement: Secondary | ICD-10-CM

## 2013-12-13 LAB — POCT INR: INR: 2.6

## 2013-12-26 DIAGNOSIS — M25579 Pain in unspecified ankle and joints of unspecified foot: Secondary | ICD-10-CM | POA: Diagnosis not present

## 2013-12-27 ENCOUNTER — Ambulatory Visit (INDEPENDENT_AMBULATORY_CARE_PROVIDER_SITE_OTHER): Payer: Medicare Other | Admitting: Pharmacist

## 2013-12-27 DIAGNOSIS — Z5181 Encounter for therapeutic drug level monitoring: Secondary | ICD-10-CM

## 2013-12-27 DIAGNOSIS — Z954 Presence of other heart-valve replacement: Secondary | ICD-10-CM | POA: Diagnosis not present

## 2013-12-27 DIAGNOSIS — I359 Nonrheumatic aortic valve disorder, unspecified: Secondary | ICD-10-CM

## 2013-12-27 LAB — POCT INR: INR: 2.5

## 2014-01-03 ENCOUNTER — Other Ambulatory Visit: Payer: Self-pay

## 2014-01-03 MED ORDER — FUROSEMIDE 80 MG PO TABS: 80.0000 mg | ORAL_TABLET | Freq: Two times a day (BID) | ORAL | Status: DC

## 2014-01-10 ENCOUNTER — Ambulatory Visit (INDEPENDENT_AMBULATORY_CARE_PROVIDER_SITE_OTHER): Payer: Medicare Other | Admitting: *Deleted

## 2014-01-10 DIAGNOSIS — Z954 Presence of other heart-valve replacement: Secondary | ICD-10-CM

## 2014-01-10 DIAGNOSIS — I359 Nonrheumatic aortic valve disorder, unspecified: Secondary | ICD-10-CM | POA: Diagnosis not present

## 2014-01-10 DIAGNOSIS — Z5181 Encounter for therapeutic drug level monitoring: Secondary | ICD-10-CM

## 2014-01-10 LAB — POCT INR: INR: 2.3

## 2014-01-24 DIAGNOSIS — I130 Hypertensive heart and chronic kidney disease with heart failure and stage 1 through stage 4 chronic kidney disease, or unspecified chronic kidney disease: Secondary | ICD-10-CM | POA: Diagnosis not present

## 2014-01-24 DIAGNOSIS — Z0000 Encounter for general adult medical examination without abnormal findings: Secondary | ICD-10-CM | POA: Diagnosis not present

## 2014-01-24 DIAGNOSIS — I509 Heart failure, unspecified: Secondary | ICD-10-CM | POA: Diagnosis not present

## 2014-01-24 DIAGNOSIS — E11359 Type 2 diabetes mellitus with proliferative diabetic retinopathy without macular edema: Secondary | ICD-10-CM | POA: Diagnosis not present

## 2014-01-24 DIAGNOSIS — E1165 Type 2 diabetes mellitus with hyperglycemia: Secondary | ICD-10-CM | POA: Diagnosis not present

## 2014-01-24 DIAGNOSIS — E1139 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: Secondary | ICD-10-CM | POA: Diagnosis not present

## 2014-01-24 DIAGNOSIS — N039 Chronic nephritic syndrome with unspecified morphologic changes: Secondary | ICD-10-CM | POA: Diagnosis not present

## 2014-01-24 DIAGNOSIS — N183 Chronic kidney disease, stage 3 unspecified: Secondary | ICD-10-CM | POA: Diagnosis not present

## 2014-01-24 DIAGNOSIS — R809 Proteinuria, unspecified: Secondary | ICD-10-CM | POA: Diagnosis not present

## 2014-01-24 DIAGNOSIS — Z1331 Encounter for screening for depression: Secondary | ICD-10-CM | POA: Diagnosis not present

## 2014-01-28 ENCOUNTER — Other Ambulatory Visit: Payer: Self-pay | Admitting: Interventional Cardiology

## 2014-01-31 ENCOUNTER — Ambulatory Visit (INDEPENDENT_AMBULATORY_CARE_PROVIDER_SITE_OTHER): Payer: Medicare Other | Admitting: *Deleted

## 2014-01-31 DIAGNOSIS — I359 Nonrheumatic aortic valve disorder, unspecified: Secondary | ICD-10-CM | POA: Diagnosis not present

## 2014-01-31 DIAGNOSIS — Z954 Presence of other heart-valve replacement: Secondary | ICD-10-CM | POA: Diagnosis not present

## 2014-01-31 DIAGNOSIS — Z5181 Encounter for therapeutic drug level monitoring: Secondary | ICD-10-CM | POA: Diagnosis not present

## 2014-01-31 LAB — POCT INR: INR: 2.2

## 2014-02-17 DIAGNOSIS — H353190 Nonexudative age-related macular degeneration, unspecified eye, stage unspecified: Secondary | ICD-10-CM | POA: Diagnosis not present

## 2014-02-17 DIAGNOSIS — E1139 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: Secondary | ICD-10-CM | POA: Diagnosis not present

## 2014-02-17 DIAGNOSIS — E11329 Type 2 diabetes mellitus with mild nonproliferative diabetic retinopathy without macular edema: Secondary | ICD-10-CM | POA: Diagnosis not present

## 2014-02-17 DIAGNOSIS — H353290 Exudative age-related macular degeneration, unspecified eye, stage unspecified: Secondary | ICD-10-CM | POA: Diagnosis not present

## 2014-02-21 ENCOUNTER — Ambulatory Visit (INDEPENDENT_AMBULATORY_CARE_PROVIDER_SITE_OTHER): Payer: Medicare Other | Admitting: *Deleted

## 2014-02-21 DIAGNOSIS — Z5181 Encounter for therapeutic drug level monitoring: Secondary | ICD-10-CM

## 2014-02-21 DIAGNOSIS — Z954 Presence of other heart-valve replacement: Secondary | ICD-10-CM

## 2014-02-21 DIAGNOSIS — I359 Nonrheumatic aortic valve disorder, unspecified: Secondary | ICD-10-CM | POA: Diagnosis not present

## 2014-02-21 LAB — POCT INR: INR: 1.8

## 2014-03-04 ENCOUNTER — Other Ambulatory Visit: Payer: Self-pay | Admitting: Interventional Cardiology

## 2014-03-10 ENCOUNTER — Ambulatory Visit (INDEPENDENT_AMBULATORY_CARE_PROVIDER_SITE_OTHER): Payer: Medicare Other | Admitting: Pharmacist

## 2014-03-10 DIAGNOSIS — Z954 Presence of other heart-valve replacement: Secondary | ICD-10-CM | POA: Diagnosis not present

## 2014-03-10 DIAGNOSIS — Z5181 Encounter for therapeutic drug level monitoring: Secondary | ICD-10-CM

## 2014-03-10 DIAGNOSIS — I359 Nonrheumatic aortic valve disorder, unspecified: Secondary | ICD-10-CM | POA: Diagnosis not present

## 2014-03-10 LAB — POCT INR: INR: 1.9

## 2014-03-14 ENCOUNTER — Other Ambulatory Visit: Payer: Self-pay | Admitting: *Deleted

## 2014-03-14 MED ORDER — EZETIMIBE 10 MG PO TABS: 10.0000 mg | ORAL_TABLET | Freq: Every day | ORAL | Status: DC

## 2014-03-14 MED ORDER — ATORVASTATIN CALCIUM 80 MG PO TABS: 80.0000 mg | ORAL_TABLET | Freq: Every day | ORAL | Status: DC

## 2014-03-15 DIAGNOSIS — E119 Type 2 diabetes mellitus without complications: Secondary | ICD-10-CM | POA: Diagnosis not present

## 2014-03-15 DIAGNOSIS — H353290 Exudative age-related macular degeneration, unspecified eye, stage unspecified: Secondary | ICD-10-CM | POA: Diagnosis not present

## 2014-03-15 DIAGNOSIS — H269 Unspecified cataract: Secondary | ICD-10-CM | POA: Diagnosis not present

## 2014-03-23 ENCOUNTER — Ambulatory Visit (INDEPENDENT_AMBULATORY_CARE_PROVIDER_SITE_OTHER): Payer: Medicare Other | Admitting: Pharmacist Clinician (PhC)/ Clinical Pharmacy Specialist

## 2014-03-23 DIAGNOSIS — Z954 Presence of other heart-valve replacement: Secondary | ICD-10-CM

## 2014-03-23 DIAGNOSIS — I359 Nonrheumatic aortic valve disorder, unspecified: Secondary | ICD-10-CM

## 2014-03-23 DIAGNOSIS — Z5181 Encounter for therapeutic drug level monitoring: Secondary | ICD-10-CM | POA: Diagnosis not present

## 2014-03-23 LAB — POCT INR: INR: 2.4

## 2014-04-14 ENCOUNTER — Ambulatory Visit (INDEPENDENT_AMBULATORY_CARE_PROVIDER_SITE_OTHER): Payer: Medicare Other | Admitting: Pharmacist

## 2014-04-14 DIAGNOSIS — I359 Nonrheumatic aortic valve disorder, unspecified: Secondary | ICD-10-CM | POA: Diagnosis not present

## 2014-04-14 DIAGNOSIS — Z5181 Encounter for therapeutic drug level monitoring: Secondary | ICD-10-CM

## 2014-04-14 DIAGNOSIS — Z954 Presence of other heart-valve replacement: Secondary | ICD-10-CM | POA: Diagnosis not present

## 2014-04-14 LAB — POCT INR: INR: 2.2

## 2014-04-22 ENCOUNTER — Other Ambulatory Visit: Payer: Self-pay | Admitting: Interventional Cardiology

## 2014-05-05 ENCOUNTER — Ambulatory Visit (INDEPENDENT_AMBULATORY_CARE_PROVIDER_SITE_OTHER): Payer: Medicare Other | Admitting: Pharmacist

## 2014-05-05 DIAGNOSIS — Z5181 Encounter for therapeutic drug level monitoring: Secondary | ICD-10-CM

## 2014-05-05 DIAGNOSIS — Z954 Presence of other heart-valve replacement: Secondary | ICD-10-CM

## 2014-05-05 DIAGNOSIS — I359 Nonrheumatic aortic valve disorder, unspecified: Secondary | ICD-10-CM | POA: Diagnosis not present

## 2014-05-05 LAB — POCT INR: INR: 2.5

## 2014-05-08 ENCOUNTER — Other Ambulatory Visit: Payer: Self-pay

## 2014-05-08 DIAGNOSIS — Z1231 Encounter for screening mammogram for malignant neoplasm of breast: Secondary | ICD-10-CM

## 2014-05-08 MED ORDER — EZETIMIBE 10 MG PO TABS: 10.0000 mg | ORAL_TABLET | Freq: Every day | ORAL | Status: DC

## 2014-05-08 MED ORDER — ATORVASTATIN CALCIUM 80 MG PO TABS: 80.0000 mg | ORAL_TABLET | Freq: Every day | ORAL | Status: DC

## 2014-05-09 ENCOUNTER — Other Ambulatory Visit (HOSPITAL_COMMUNITY): Payer: Self-pay | Admitting: Cardiology

## 2014-05-09 DIAGNOSIS — I6529 Occlusion and stenosis of unspecified carotid artery: Secondary | ICD-10-CM

## 2014-05-18 ENCOUNTER — Ambulatory Visit (HOSPITAL_COMMUNITY): Payer: Medicare Other | Attending: Cardiovascular Disease | Admitting: Cardiology

## 2014-05-18 DIAGNOSIS — I658 Occlusion and stenosis of other precerebral arteries: Secondary | ICD-10-CM | POA: Insufficient documentation

## 2014-05-18 DIAGNOSIS — E785 Hyperlipidemia, unspecified: Secondary | ICD-10-CM | POA: Insufficient documentation

## 2014-05-18 DIAGNOSIS — Z951 Presence of aortocoronary bypass graft: Secondary | ICD-10-CM | POA: Insufficient documentation

## 2014-05-18 DIAGNOSIS — I6529 Occlusion and stenosis of unspecified carotid artery: Secondary | ICD-10-CM | POA: Insufficient documentation

## 2014-05-18 DIAGNOSIS — I2510 Atherosclerotic heart disease of native coronary artery without angina pectoris: Secondary | ICD-10-CM | POA: Insufficient documentation

## 2014-05-18 DIAGNOSIS — E119 Type 2 diabetes mellitus without complications: Secondary | ICD-10-CM | POA: Insufficient documentation

## 2014-05-18 DIAGNOSIS — I10 Essential (primary) hypertension: Secondary | ICD-10-CM | POA: Insufficient documentation

## 2014-05-18 DIAGNOSIS — H35059 Retinal neovascularization, unspecified, unspecified eye: Secondary | ICD-10-CM | POA: Diagnosis not present

## 2014-05-18 DIAGNOSIS — H353190 Nonexudative age-related macular degeneration, unspecified eye, stage unspecified: Secondary | ICD-10-CM | POA: Diagnosis not present

## 2014-05-18 DIAGNOSIS — H353290 Exudative age-related macular degeneration, unspecified eye, stage unspecified: Secondary | ICD-10-CM | POA: Diagnosis not present

## 2014-05-18 NOTE — Progress Notes (Signed)
Carotid duplex performed 

## 2014-05-21 ENCOUNTER — Other Ambulatory Visit: Payer: Self-pay | Admitting: Interventional Cardiology

## 2014-05-23 ENCOUNTER — Ambulatory Visit (INDEPENDENT_AMBULATORY_CARE_PROVIDER_SITE_OTHER): Payer: Medicare Other | Admitting: Pharmacist

## 2014-05-23 ENCOUNTER — Other Ambulatory Visit (INDEPENDENT_AMBULATORY_CARE_PROVIDER_SITE_OTHER): Payer: Medicare Other

## 2014-05-23 DIAGNOSIS — I2510 Atherosclerotic heart disease of native coronary artery without angina pectoris: Secondary | ICD-10-CM

## 2014-05-23 DIAGNOSIS — Z5181 Encounter for therapeutic drug level monitoring: Secondary | ICD-10-CM | POA: Diagnosis not present

## 2014-05-23 DIAGNOSIS — I359 Nonrheumatic aortic valve disorder, unspecified: Secondary | ICD-10-CM | POA: Diagnosis not present

## 2014-05-23 DIAGNOSIS — Z954 Presence of other heart-valve replacement: Secondary | ICD-10-CM

## 2014-05-23 LAB — HEPATIC FUNCTION PANEL
ALK PHOS: 55 U/L (ref 39–117)
ALT: 22 U/L (ref 0–35)
AST: 29 U/L (ref 0–37)
Albumin: 3.9 g/dL (ref 3.5–5.2)
BILIRUBIN DIRECT: 0.2 mg/dL (ref 0.0–0.3)
BILIRUBIN TOTAL: 1.3 mg/dL — AB (ref 0.2–1.2)
Total Protein: 7.4 g/dL (ref 6.0–8.3)

## 2014-05-23 LAB — POCT INR: INR: 1.9

## 2014-05-24 ENCOUNTER — Ambulatory Visit
Admission: RE | Admit: 2014-05-24 | Discharge: 2014-05-24 | Disposition: A | Discharge status: Home / Self Care | Payer: Medicare Other | Source: Ambulatory Visit

## 2014-05-24 ENCOUNTER — Other Ambulatory Visit: Payer: Self-pay | Admitting: Cardiology

## 2014-05-24 ENCOUNTER — Telehealth: Payer: Self-pay | Admitting: Interventional Cardiology

## 2014-05-24 DIAGNOSIS — I6529 Occlusion and stenosis of unspecified carotid artery: Secondary | ICD-10-CM

## 2014-05-24 DIAGNOSIS — Z1231 Encounter for screening mammogram for malignant neoplasm of breast: Secondary | ICD-10-CM | POA: Diagnosis not present

## 2014-05-24 NOTE — Telephone Encounter (Signed)
Follow Up  Pt returned call to Amy.

## 2014-05-24 NOTE — Telephone Encounter (Signed)
Returned pt's call.

## 2014-05-25 LAB — NMR LIPOPROFILE WITH LIPIDS
Cholesterol, Total: 143 mg/dL (ref 100–199)
HDL Particle Number: 30 umol/L — ABNORMAL LOW (ref >=30.5)
HDL SIZE: 8.9 nm — AB (ref >=9.2)
HDL-C: 48 mg/dL (ref >39)
LDL (calc): 64 mg/dL (ref 0–99)
LDL PARTICLE NUMBER: 1121 nmol/L — AB (ref <1000)
LDL SIZE: 21 nm (ref >=20.8)
LP-IR SCORE: 63 — AB (ref <=45)
Large HDL-P: 5 umol/L (ref >=4.8)
Large VLDL-P: 5.9 nmol/L — ABNORMAL HIGH (ref <=2.7)
SMALL LDL PARTICLE NUMBER: 474 nmol/L (ref <=527)
Triglycerides: 154 mg/dL — ABNORMAL HIGH (ref 0–149)
VLDL Size: 52.7 nm — ABNORMAL HIGH (ref <=46.6)

## 2014-05-26 ENCOUNTER — Other Ambulatory Visit: Payer: Self-pay | Admitting: Cardiology

## 2014-05-26 DIAGNOSIS — I2510 Atherosclerotic heart disease of native coronary artery without angina pectoris: Secondary | ICD-10-CM

## 2014-05-26 NOTE — Addendum Note (Signed)
Addended byUlla Potash H on: 05/26/2014 11:39 AM   Modules accepted: Orders

## 2014-05-31 ENCOUNTER — Other Ambulatory Visit: Payer: Medicare Other

## 2014-06-06 ENCOUNTER — Ambulatory Visit (INDEPENDENT_AMBULATORY_CARE_PROVIDER_SITE_OTHER): Payer: Medicare Other | Admitting: *Deleted

## 2014-06-06 DIAGNOSIS — Z954 Presence of other heart-valve replacement: Secondary | ICD-10-CM | POA: Diagnosis not present

## 2014-06-06 DIAGNOSIS — Z5181 Encounter for therapeutic drug level monitoring: Secondary | ICD-10-CM

## 2014-06-06 DIAGNOSIS — I359 Nonrheumatic aortic valve disorder, unspecified: Secondary | ICD-10-CM

## 2014-06-06 DIAGNOSIS — Z23 Encounter for immunization: Secondary | ICD-10-CM | POA: Diagnosis not present

## 2014-06-06 LAB — POCT INR: INR: 1.8

## 2014-06-20 ENCOUNTER — Ambulatory Visit (INDEPENDENT_AMBULATORY_CARE_PROVIDER_SITE_OTHER): Payer: Medicare Other | Admitting: Pharmacist

## 2014-06-20 DIAGNOSIS — Z5181 Encounter for therapeutic drug level monitoring: Secondary | ICD-10-CM | POA: Diagnosis not present

## 2014-06-20 DIAGNOSIS — Z954 Presence of other heart-valve replacement: Secondary | ICD-10-CM

## 2014-06-20 DIAGNOSIS — I359 Nonrheumatic aortic valve disorder, unspecified: Secondary | ICD-10-CM | POA: Diagnosis not present

## 2014-06-20 LAB — POCT INR: INR: 1.9

## 2014-06-23 ENCOUNTER — Other Ambulatory Visit: Payer: Self-pay | Admitting: *Deleted

## 2014-06-23 MED ORDER — FUROSEMIDE 80 MG PO TABS: ORAL_TABLET | ORAL | Status: DC

## 2014-06-23 MED ORDER — ISOSORBIDE MONONITRATE ER 60 MG PO TB24: ORAL_TABLET | ORAL | Status: DC

## 2014-07-04 ENCOUNTER — Telehealth: Payer: Self-pay

## 2014-07-04 ENCOUNTER — Ambulatory Visit (INDEPENDENT_AMBULATORY_CARE_PROVIDER_SITE_OTHER): Payer: Medicare Other

## 2014-07-04 DIAGNOSIS — Z5181 Encounter for therapeutic drug level monitoring: Secondary | ICD-10-CM | POA: Diagnosis not present

## 2014-07-04 DIAGNOSIS — Z952 Presence of prosthetic heart valve: Secondary | ICD-10-CM

## 2014-07-04 DIAGNOSIS — Z954 Presence of other heart-valve replacement: Secondary | ICD-10-CM | POA: Diagnosis not present

## 2014-07-04 DIAGNOSIS — I359 Nonrheumatic aortic valve disorder, unspecified: Secondary | ICD-10-CM | POA: Diagnosis not present

## 2014-07-04 LAB — POCT INR: INR: 1.9

## 2014-07-04 NOTE — Telephone Encounter (Signed)
Range changed on anticoagulation monitoring sheet in Epic.

## 2014-07-04 NOTE — Telephone Encounter (Signed)
Message copied by Theophilus Kinds on Tue Jul 04, 2014  1:49 PM ------      Message from: Lafayette Dragon      Created: Tue Jul 04, 2014 12:19 PM       Yes, pt had an acute upper GI bleed in May 2014 and was found to have gastric erosions on upper endoscopy.. It is our recommendation to keep her INR between 2.0 and 2.5. Thanks DB      ----- Message -----         From: Flossie Dibble, RN         Sent: 07/04/2014  12:01 PM           To: Lafayette Dragon, MD            Pt has hx of GI bleeding on Coumadin.  Pt and husband state Dr. Delfin Edis recommended INR to be 2.0-2.5.  Please advise if this is what pt's range should be so we can adjust in chart for anticoagulation monitoring and management purposes.  Thanks.       ------

## 2014-07-07 ENCOUNTER — Other Ambulatory Visit: Payer: Self-pay

## 2014-07-11 ENCOUNTER — Other Ambulatory Visit: Payer: Self-pay

## 2014-07-11 MED ORDER — ATORVASTATIN CALCIUM 80 MG PO TABS: 80.0000 mg | ORAL_TABLET | Freq: Every day | ORAL | Status: DC

## 2014-07-11 MED ORDER — EZETIMIBE 10 MG PO TABS: 10.0000 mg | ORAL_TABLET | Freq: Every day | ORAL | Status: DC

## 2014-07-18 ENCOUNTER — Ambulatory Visit (INDEPENDENT_AMBULATORY_CARE_PROVIDER_SITE_OTHER): Payer: Medicare Other

## 2014-07-18 DIAGNOSIS — Z952 Presence of prosthetic heart valve: Secondary | ICD-10-CM

## 2014-07-18 DIAGNOSIS — Z954 Presence of other heart-valve replacement: Secondary | ICD-10-CM | POA: Diagnosis not present

## 2014-07-18 DIAGNOSIS — I359 Nonrheumatic aortic valve disorder, unspecified: Secondary | ICD-10-CM

## 2014-07-18 DIAGNOSIS — Z5181 Encounter for therapeutic drug level monitoring: Secondary | ICD-10-CM | POA: Diagnosis not present

## 2014-07-18 LAB — POCT INR: INR: 2.2

## 2014-07-19 DIAGNOSIS — Z23 Encounter for immunization: Secondary | ICD-10-CM | POA: Diagnosis not present

## 2014-07-19 DIAGNOSIS — I130 Hypertensive heart and chronic kidney disease with heart failure and stage 1 through stage 4 chronic kidney disease, or unspecified chronic kidney disease: Secondary | ICD-10-CM | POA: Diagnosis not present

## 2014-07-19 DIAGNOSIS — I2510 Atherosclerotic heart disease of native coronary artery without angina pectoris: Secondary | ICD-10-CM | POA: Diagnosis not present

## 2014-07-19 DIAGNOSIS — E1129 Type 2 diabetes mellitus with other diabetic kidney complication: Secondary | ICD-10-CM | POA: Diagnosis not present

## 2014-07-19 DIAGNOSIS — E1139 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: Secondary | ICD-10-CM | POA: Diagnosis not present

## 2014-07-19 DIAGNOSIS — E780 Pure hypercholesterolemia: Secondary | ICD-10-CM | POA: Diagnosis not present

## 2014-07-19 DIAGNOSIS — D649 Anemia, unspecified: Secondary | ICD-10-CM | POA: Diagnosis not present

## 2014-07-19 DIAGNOSIS — N183 Chronic kidney disease, stage 3 (moderate): Secondary | ICD-10-CM | POA: Diagnosis not present

## 2014-07-19 DIAGNOSIS — Z952 Presence of prosthetic heart valve: Secondary | ICD-10-CM | POA: Diagnosis not present

## 2014-07-20 ENCOUNTER — Other Ambulatory Visit: Payer: Self-pay | Admitting: Interventional Cardiology

## 2014-08-08 ENCOUNTER — Ambulatory Visit (INDEPENDENT_AMBULATORY_CARE_PROVIDER_SITE_OTHER): Payer: Medicare Other

## 2014-08-08 DIAGNOSIS — I359 Nonrheumatic aortic valve disorder, unspecified: Secondary | ICD-10-CM | POA: Diagnosis not present

## 2014-08-08 DIAGNOSIS — Z954 Presence of other heart-valve replacement: Secondary | ICD-10-CM

## 2014-08-08 DIAGNOSIS — Z5181 Encounter for therapeutic drug level monitoring: Secondary | ICD-10-CM | POA: Diagnosis not present

## 2014-08-08 DIAGNOSIS — Z952 Presence of prosthetic heart valve: Secondary | ICD-10-CM

## 2014-08-08 LAB — POCT INR: INR: 2.4

## 2014-08-15 DIAGNOSIS — H3532 Exudative age-related macular degeneration: Secondary | ICD-10-CM | POA: Diagnosis not present

## 2014-08-15 DIAGNOSIS — H3531 Nonexudative age-related macular degeneration: Secondary | ICD-10-CM | POA: Diagnosis not present

## 2014-08-21 DIAGNOSIS — N3091 Cystitis, unspecified with hematuria: Secondary | ICD-10-CM | POA: Diagnosis not present

## 2014-08-22 DIAGNOSIS — S61211A Laceration without foreign body of left index finger without damage to nail, initial encounter: Secondary | ICD-10-CM | POA: Diagnosis not present

## 2014-08-25 DIAGNOSIS — S61211A Laceration without foreign body of left index finger without damage to nail, initial encounter: Secondary | ICD-10-CM | POA: Diagnosis not present

## 2014-08-25 DIAGNOSIS — N3000 Acute cystitis without hematuria: Secondary | ICD-10-CM | POA: Diagnosis not present

## 2014-09-04 DIAGNOSIS — Z23 Encounter for immunization: Secondary | ICD-10-CM | POA: Diagnosis not present

## 2014-09-04 DIAGNOSIS — S61211D Laceration without foreign body of left index finger without damage to nail, subsequent encounter: Secondary | ICD-10-CM | POA: Diagnosis not present

## 2014-09-05 ENCOUNTER — Ambulatory Visit (INDEPENDENT_AMBULATORY_CARE_PROVIDER_SITE_OTHER): Payer: Medicare Other | Admitting: Pharmacist

## 2014-09-05 DIAGNOSIS — I359 Nonrheumatic aortic valve disorder, unspecified: Secondary | ICD-10-CM

## 2014-09-05 DIAGNOSIS — Z5181 Encounter for therapeutic drug level monitoring: Secondary | ICD-10-CM

## 2014-09-05 DIAGNOSIS — Z954 Presence of other heart-valve replacement: Secondary | ICD-10-CM

## 2014-09-05 DIAGNOSIS — Z952 Presence of prosthetic heart valve: Secondary | ICD-10-CM

## 2014-09-05 LAB — POCT INR: INR: 2.4

## 2014-09-06 ENCOUNTER — Other Ambulatory Visit: Payer: Self-pay

## 2014-09-06 ENCOUNTER — Telehealth: Payer: Self-pay

## 2014-09-06 MED ORDER — COUMADIN 5 MG PO TABS: ORAL_TABLET | ORAL | Status: DC

## 2014-09-06 NOTE — Telephone Encounter (Signed)
Refill done as requested 

## 2014-09-13 ENCOUNTER — Ambulatory Visit
Admission: RE | Admit: 2014-09-13 | Discharge: 2014-09-13 | Disposition: A | Discharge status: Home / Self Care | Payer: Medicare Other | Source: Ambulatory Visit | Attending: Family Medicine | Admitting: Family Medicine

## 2014-09-13 ENCOUNTER — Other Ambulatory Visit: Payer: Self-pay | Admitting: Family Medicine

## 2014-09-13 DIAGNOSIS — S61211D Laceration without foreign body of left index finger without damage to nail, subsequent encounter: Secondary | ICD-10-CM | POA: Diagnosis not present

## 2014-09-13 DIAGNOSIS — R2689 Other abnormalities of gait and mobility: Secondary | ICD-10-CM | POA: Diagnosis not present

## 2014-09-13 DIAGNOSIS — R52 Pain, unspecified: Secondary | ICD-10-CM

## 2014-09-13 DIAGNOSIS — M79671 Pain in right foot: Secondary | ICD-10-CM | POA: Diagnosis not present

## 2014-09-13 DIAGNOSIS — S99911A Unspecified injury of right ankle, initial encounter: Secondary | ICD-10-CM | POA: Diagnosis not present

## 2014-09-25 DIAGNOSIS — R350 Frequency of micturition: Secondary | ICD-10-CM | POA: Diagnosis not present

## 2014-09-25 DIAGNOSIS — N390 Urinary tract infection, site not specified: Secondary | ICD-10-CM | POA: Diagnosis not present

## 2014-09-26 ENCOUNTER — Ambulatory Visit (INDEPENDENT_AMBULATORY_CARE_PROVIDER_SITE_OTHER): Payer: Medicare Other

## 2014-09-26 DIAGNOSIS — Z952 Presence of prosthetic heart valve: Secondary | ICD-10-CM

## 2014-09-26 DIAGNOSIS — I359 Nonrheumatic aortic valve disorder, unspecified: Secondary | ICD-10-CM

## 2014-09-26 DIAGNOSIS — Z5181 Encounter for therapeutic drug level monitoring: Secondary | ICD-10-CM

## 2014-09-26 DIAGNOSIS — Z954 Presence of other heart-valve replacement: Secondary | ICD-10-CM

## 2014-09-26 LAB — POCT INR: INR: 3.1

## 2014-09-27 DIAGNOSIS — R269 Unspecified abnormalities of gait and mobility: Secondary | ICD-10-CM | POA: Diagnosis not present

## 2014-09-27 DIAGNOSIS — R296 Repeated falls: Secondary | ICD-10-CM | POA: Diagnosis not present

## 2014-09-27 DIAGNOSIS — Z9181 History of falling: Secondary | ICD-10-CM | POA: Diagnosis not present

## 2014-10-04 ENCOUNTER — Ambulatory Visit (INDEPENDENT_AMBULATORY_CARE_PROVIDER_SITE_OTHER): Payer: Medicare Other | Admitting: *Deleted

## 2014-10-04 DIAGNOSIS — Z954 Presence of other heart-valve replacement: Secondary | ICD-10-CM | POA: Diagnosis not present

## 2014-10-04 DIAGNOSIS — I359 Nonrheumatic aortic valve disorder, unspecified: Secondary | ICD-10-CM

## 2014-10-04 DIAGNOSIS — Z952 Presence of prosthetic heart valve: Secondary | ICD-10-CM

## 2014-10-04 DIAGNOSIS — Z5181 Encounter for therapeutic drug level monitoring: Secondary | ICD-10-CM | POA: Diagnosis not present

## 2014-10-04 LAB — POCT INR: INR: 2.3

## 2014-10-24 ENCOUNTER — Ambulatory Visit (INDEPENDENT_AMBULATORY_CARE_PROVIDER_SITE_OTHER): Payer: Medicare Other | Admitting: *Deleted

## 2014-10-24 DIAGNOSIS — Z954 Presence of other heart-valve replacement: Secondary | ICD-10-CM

## 2014-10-24 DIAGNOSIS — Z5181 Encounter for therapeutic drug level monitoring: Secondary | ICD-10-CM

## 2014-10-24 DIAGNOSIS — I359 Nonrheumatic aortic valve disorder, unspecified: Secondary | ICD-10-CM | POA: Diagnosis not present

## 2014-10-24 DIAGNOSIS — Z952 Presence of prosthetic heart valve: Secondary | ICD-10-CM

## 2014-10-24 LAB — POCT INR: INR: 3

## 2014-10-31 ENCOUNTER — Ambulatory Visit (INDEPENDENT_AMBULATORY_CARE_PROVIDER_SITE_OTHER): Payer: Medicare Other | Admitting: Pharmacist

## 2014-10-31 DIAGNOSIS — I359 Nonrheumatic aortic valve disorder, unspecified: Secondary | ICD-10-CM

## 2014-10-31 DIAGNOSIS — Z954 Presence of other heart-valve replacement: Secondary | ICD-10-CM | POA: Diagnosis not present

## 2014-10-31 DIAGNOSIS — Z952 Presence of prosthetic heart valve: Secondary | ICD-10-CM

## 2014-10-31 DIAGNOSIS — Z5181 Encounter for therapeutic drug level monitoring: Secondary | ICD-10-CM

## 2014-10-31 LAB — POCT INR: INR: 2.8

## 2014-11-11 ENCOUNTER — Other Ambulatory Visit: Payer: Self-pay | Admitting: Interventional Cardiology

## 2014-11-14 ENCOUNTER — Ambulatory Visit (INDEPENDENT_AMBULATORY_CARE_PROVIDER_SITE_OTHER): Payer: Medicare Other | Admitting: *Deleted

## 2014-11-14 DIAGNOSIS — Z954 Presence of other heart-valve replacement: Secondary | ICD-10-CM | POA: Diagnosis not present

## 2014-11-14 DIAGNOSIS — I359 Nonrheumatic aortic valve disorder, unspecified: Secondary | ICD-10-CM

## 2014-11-14 DIAGNOSIS — Z5181 Encounter for therapeutic drug level monitoring: Secondary | ICD-10-CM

## 2014-11-14 DIAGNOSIS — Z952 Presence of prosthetic heart valve: Secondary | ICD-10-CM

## 2014-11-14 LAB — POCT INR: INR: 2.9

## 2014-11-21 ENCOUNTER — Other Ambulatory Visit (INDEPENDENT_AMBULATORY_CARE_PROVIDER_SITE_OTHER): Payer: Medicare Other | Admitting: *Deleted

## 2014-11-21 DIAGNOSIS — I2510 Atherosclerotic heart disease of native coronary artery without angina pectoris: Secondary | ICD-10-CM | POA: Diagnosis not present

## 2014-11-21 LAB — HEPATIC FUNCTION PANEL
ALT: 17 U/L (ref 0–35)
AST: 23 U/L (ref 0–37)
Albumin: 4.2 g/dL (ref 3.5–5.2)
Alkaline Phosphatase: 52 U/L (ref 39–117)
BILIRUBIN TOTAL: 1.1 mg/dL (ref 0.2–1.2)
Bilirubin, Direct: 0.2 mg/dL (ref 0.0–0.3)
Total Protein: 7.3 g/dL (ref 6.0–8.3)

## 2014-11-23 LAB — NMR LIPOPROFILE WITH LIPIDS
Cholesterol, Total: 144 mg/dL (ref 100–199)
HDL Particle Number: 28.4 umol/L — ABNORMAL LOW (ref >=30.5)
HDL Size: 8.8 nm — ABNORMAL LOW (ref >=9.2)
HDL-C: 46 mg/dL (ref >39)
LARGE HDL: 5.5 umol/L (ref >=4.8)
LARGE VLDL-P: 10.4 nmol/L — AB (ref <=2.7)
LDL (calc): 58 mg/dL (ref 0–99)
LDL Particle Number: 1030 nmol/L — ABNORMAL HIGH (ref <1000)
LDL SIZE: 20.4 nm (ref >=20.8)
LP-IR SCORE: 69 — AB (ref <=45)
Small LDL Particle Number: 487 nmol/L (ref <=527)
TRIGLYCERIDES: 198 mg/dL — AB (ref 0–149)
VLDL Size: 52.6 nm — ABNORMAL HIGH (ref <=46.6)

## 2014-11-28 ENCOUNTER — Telehealth: Payer: Self-pay | Admitting: *Deleted

## 2014-11-28 ENCOUNTER — Ambulatory Visit (HOSPITAL_COMMUNITY): Payer: Medicare Other | Attending: Cardiology | Admitting: Cardiology

## 2014-11-28 ENCOUNTER — Ambulatory Visit (INDEPENDENT_AMBULATORY_CARE_PROVIDER_SITE_OTHER): Payer: Medicare Other | Admitting: *Deleted

## 2014-11-28 DIAGNOSIS — Z954 Presence of other heart-valve replacement: Secondary | ICD-10-CM

## 2014-11-28 DIAGNOSIS — Z952 Presence of prosthetic heart valve: Secondary | ICD-10-CM

## 2014-11-28 DIAGNOSIS — I6523 Occlusion and stenosis of bilateral carotid arteries: Secondary | ICD-10-CM | POA: Diagnosis not present

## 2014-11-28 DIAGNOSIS — I359 Nonrheumatic aortic valve disorder, unspecified: Secondary | ICD-10-CM

## 2014-11-28 DIAGNOSIS — Z5181 Encounter for therapeutic drug level monitoring: Secondary | ICD-10-CM | POA: Diagnosis not present

## 2014-11-28 DIAGNOSIS — I25810 Atherosclerosis of coronary artery bypass graft(s) without angina pectoris: Secondary | ICD-10-CM

## 2014-11-28 DIAGNOSIS — I6529 Occlusion and stenosis of unspecified carotid artery: Secondary | ICD-10-CM

## 2014-11-28 LAB — POCT INR: INR: 2.5

## 2014-11-28 NOTE — Progress Notes (Signed)
Carotid duplex performed 

## 2014-11-28 NOTE — Telephone Encounter (Signed)
-----   Message from Aris Georgia, [facility] sent at 11/23/2014  1:43 PM EST ----- LDL-P and LDL improving. Patient on lipitor 80 mg qd and Zetia 10 mg qd. Given panel improving, no changes at this time. Plan: 1. No change at this time. 2. Recheck NMR LipoProfile and hepatic panel in 6 months. Please notify patient, update meds, and set up labs. Thanks.

## 2014-11-28 NOTE — Telephone Encounter (Signed)
Made pt aware of no med changes and recheck labs in 6 months. Made appt for 05/29/15. Pt verbalized understanding and was in agreement with this plan.

## 2014-12-01 ENCOUNTER — Telehealth: Payer: Self-pay | Admitting: *Deleted

## 2014-12-01 DIAGNOSIS — I6529 Occlusion and stenosis of unspecified carotid artery: Secondary | ICD-10-CM

## 2014-12-01 NOTE — Telephone Encounter (Signed)
-----   Message from Jettie Booze, MD sent at 11/30/2014  8:49 PM EST ----- Stable. Repeat in 6 months

## 2014-12-01 NOTE — Telephone Encounter (Signed)
Informed pt of carotid doppler results and to repeat study in 6 months. Pt verbalized understanding and was in agreement with this plan.

## 2014-12-19 ENCOUNTER — Ambulatory Visit (INDEPENDENT_AMBULATORY_CARE_PROVIDER_SITE_OTHER): Payer: Medicare Other | Admitting: *Deleted

## 2014-12-19 DIAGNOSIS — I359 Nonrheumatic aortic valve disorder, unspecified: Secondary | ICD-10-CM | POA: Diagnosis not present

## 2014-12-19 DIAGNOSIS — Z954 Presence of other heart-valve replacement: Secondary | ICD-10-CM

## 2014-12-19 DIAGNOSIS — Z952 Presence of prosthetic heart valve: Secondary | ICD-10-CM

## 2014-12-19 DIAGNOSIS — Z5181 Encounter for therapeutic drug level monitoring: Secondary | ICD-10-CM

## 2014-12-19 LAB — POCT INR: INR: 3.1

## 2015-01-02 ENCOUNTER — Ambulatory Visit (INDEPENDENT_AMBULATORY_CARE_PROVIDER_SITE_OTHER): Payer: Medicare Other

## 2015-01-02 DIAGNOSIS — Z954 Presence of other heart-valve replacement: Secondary | ICD-10-CM

## 2015-01-02 DIAGNOSIS — Z5181 Encounter for therapeutic drug level monitoring: Secondary | ICD-10-CM

## 2015-01-02 DIAGNOSIS — Z952 Presence of prosthetic heart valve: Secondary | ICD-10-CM

## 2015-01-02 DIAGNOSIS — I359 Nonrheumatic aortic valve disorder, unspecified: Secondary | ICD-10-CM | POA: Diagnosis not present

## 2015-01-02 LAB — POCT INR: INR: 2.1

## 2015-01-06 ENCOUNTER — Other Ambulatory Visit: Payer: Self-pay | Admitting: Interventional Cardiology

## 2015-01-23 ENCOUNTER — Ambulatory Visit (INDEPENDENT_AMBULATORY_CARE_PROVIDER_SITE_OTHER): Payer: Medicare Other | Admitting: *Deleted

## 2015-01-23 DIAGNOSIS — Z954 Presence of other heart-valve replacement: Secondary | ICD-10-CM

## 2015-01-23 DIAGNOSIS — Z5181 Encounter for therapeutic drug level monitoring: Secondary | ICD-10-CM | POA: Diagnosis not present

## 2015-01-23 DIAGNOSIS — I359 Nonrheumatic aortic valve disorder, unspecified: Secondary | ICD-10-CM

## 2015-01-23 DIAGNOSIS — Z952 Presence of prosthetic heart valve: Secondary | ICD-10-CM

## 2015-01-23 LAB — POCT INR: INR: 2.1

## 2015-01-30 DIAGNOSIS — Z0000 Encounter for general adult medical examination without abnormal findings: Secondary | ICD-10-CM | POA: Diagnosis not present

## 2015-01-30 DIAGNOSIS — I2510 Atherosclerotic heart disease of native coronary artery without angina pectoris: Secondary | ICD-10-CM | POA: Diagnosis not present

## 2015-01-30 DIAGNOSIS — E780 Pure hypercholesterolemia: Secondary | ICD-10-CM | POA: Diagnosis not present

## 2015-01-30 DIAGNOSIS — I663 Occlusion and stenosis of cerebellar arteries: Secondary | ICD-10-CM | POA: Diagnosis not present

## 2015-01-30 DIAGNOSIS — E1129 Type 2 diabetes mellitus with other diabetic kidney complication: Secondary | ICD-10-CM | POA: Diagnosis not present

## 2015-01-30 DIAGNOSIS — R809 Proteinuria, unspecified: Secondary | ICD-10-CM | POA: Diagnosis not present

## 2015-01-30 DIAGNOSIS — Z1389 Encounter for screening for other disorder: Secondary | ICD-10-CM | POA: Diagnosis not present

## 2015-01-30 DIAGNOSIS — E1139 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: Secondary | ICD-10-CM | POA: Diagnosis not present

## 2015-01-30 DIAGNOSIS — H3530 Unspecified macular degeneration: Secondary | ICD-10-CM | POA: Diagnosis not present

## 2015-01-30 DIAGNOSIS — I130 Hypertensive heart and chronic kidney disease with heart failure and stage 1 through stage 4 chronic kidney disease, or unspecified chronic kidney disease: Secondary | ICD-10-CM | POA: Diagnosis not present

## 2015-01-30 DIAGNOSIS — Z952 Presence of prosthetic heart valve: Secondary | ICD-10-CM | POA: Diagnosis not present

## 2015-02-10 ENCOUNTER — Other Ambulatory Visit: Payer: Self-pay | Admitting: Interventional Cardiology

## 2015-02-12 NOTE — Telephone Encounter (Signed)
Per note 3.9.15

## 2015-02-13 ENCOUNTER — Ambulatory Visit (INDEPENDENT_AMBULATORY_CARE_PROVIDER_SITE_OTHER): Payer: Medicare Other | Admitting: *Deleted

## 2015-02-13 DIAGNOSIS — Z952 Presence of prosthetic heart valve: Secondary | ICD-10-CM

## 2015-02-13 DIAGNOSIS — Z5181 Encounter for therapeutic drug level monitoring: Secondary | ICD-10-CM

## 2015-02-13 DIAGNOSIS — Z954 Presence of other heart-valve replacement: Secondary | ICD-10-CM

## 2015-02-13 DIAGNOSIS — I359 Nonrheumatic aortic valve disorder, unspecified: Secondary | ICD-10-CM

## 2015-02-13 LAB — POCT INR: INR: 2.1

## 2015-02-17 ENCOUNTER — Other Ambulatory Visit: Payer: Self-pay | Admitting: Interventional Cardiology

## 2015-03-09 ENCOUNTER — Ambulatory Visit (INDEPENDENT_AMBULATORY_CARE_PROVIDER_SITE_OTHER): Payer: Medicare Other | Admitting: *Deleted

## 2015-03-09 DIAGNOSIS — I359 Nonrheumatic aortic valve disorder, unspecified: Secondary | ICD-10-CM

## 2015-03-09 DIAGNOSIS — Z954 Presence of other heart-valve replacement: Secondary | ICD-10-CM

## 2015-03-09 DIAGNOSIS — Z5181 Encounter for therapeutic drug level monitoring: Secondary | ICD-10-CM

## 2015-03-09 DIAGNOSIS — Z952 Presence of prosthetic heart valve: Secondary | ICD-10-CM

## 2015-03-09 LAB — POCT INR: INR: 1.6

## 2015-03-19 ENCOUNTER — Other Ambulatory Visit: Payer: Self-pay

## 2015-03-23 ENCOUNTER — Ambulatory Visit (INDEPENDENT_AMBULATORY_CARE_PROVIDER_SITE_OTHER): Payer: Medicare Other | Admitting: *Deleted

## 2015-03-23 DIAGNOSIS — Z952 Presence of prosthetic heart valve: Secondary | ICD-10-CM

## 2015-03-23 DIAGNOSIS — Z5181 Encounter for therapeutic drug level monitoring: Secondary | ICD-10-CM | POA: Diagnosis not present

## 2015-03-23 DIAGNOSIS — I359 Nonrheumatic aortic valve disorder, unspecified: Secondary | ICD-10-CM | POA: Diagnosis not present

## 2015-03-23 DIAGNOSIS — Z954 Presence of other heart-valve replacement: Secondary | ICD-10-CM

## 2015-03-23 LAB — POCT INR: INR: 1.8

## 2015-03-24 ENCOUNTER — Other Ambulatory Visit: Payer: Self-pay | Admitting: Interventional Cardiology

## 2015-04-05 ENCOUNTER — Ambulatory Visit (INDEPENDENT_AMBULATORY_CARE_PROVIDER_SITE_OTHER): Payer: Medicare Other | Admitting: *Deleted

## 2015-04-05 ENCOUNTER — Encounter: Payer: Self-pay | Admitting: Interventional Cardiology

## 2015-04-05 ENCOUNTER — Ambulatory Visit (INDEPENDENT_AMBULATORY_CARE_PROVIDER_SITE_OTHER): Payer: Medicare Other | Admitting: Interventional Cardiology

## 2015-04-05 VITALS — BP 114/56 | HR 62 | Ht 60.0 in | Wt 138.8 lb

## 2015-04-05 DIAGNOSIS — Z952 Presence of prosthetic heart valve: Secondary | ICD-10-CM

## 2015-04-05 DIAGNOSIS — Z954 Presence of other heart-valve replacement: Secondary | ICD-10-CM

## 2015-04-05 DIAGNOSIS — Z01810 Encounter for preprocedural cardiovascular examination: Secondary | ICD-10-CM | POA: Diagnosis not present

## 2015-04-05 DIAGNOSIS — I6523 Occlusion and stenosis of bilateral carotid arteries: Secondary | ICD-10-CM

## 2015-04-05 DIAGNOSIS — I359 Nonrheumatic aortic valve disorder, unspecified: Secondary | ICD-10-CM

## 2015-04-05 DIAGNOSIS — Z5181 Encounter for therapeutic drug level monitoring: Secondary | ICD-10-CM

## 2015-04-05 DIAGNOSIS — I25118 Atherosclerotic heart disease of native coronary artery with other forms of angina pectoris: Secondary | ICD-10-CM

## 2015-04-05 DIAGNOSIS — R0602 Shortness of breath: Secondary | ICD-10-CM | POA: Diagnosis not present

## 2015-04-05 DIAGNOSIS — R0789 Other chest pain: Secondary | ICD-10-CM

## 2015-04-05 DIAGNOSIS — Z7901 Long term (current) use of anticoagulants: Secondary | ICD-10-CM

## 2015-04-05 LAB — CBC WITH DIFFERENTIAL/PLATELET
Basophils Absolute: 0 10*3/uL (ref 0.0–0.1)
Basophils Relative: 0.3 % (ref 0.0–3.0)
EOS PCT: 1.3 % (ref 0.0–5.0)
Eosinophils Absolute: 0.1 10*3/uL (ref 0.0–0.7)
HCT: 30.5 % — ABNORMAL LOW (ref 36.0–46.0)
Hemoglobin: 10.4 g/dL — ABNORMAL LOW (ref 12.0–15.0)
Lymphocytes Relative: 31.8 % (ref 12.0–46.0)
Lymphs Abs: 2.5 10*3/uL (ref 0.7–4.0)
MCHC: 34.1 g/dL (ref 30.0–36.0)
MCV: 92 fl (ref 78.0–100.0)
Monocytes Absolute: 0.9 10*3/uL (ref 0.1–1.0)
Monocytes Relative: 11.1 % (ref 3.0–12.0)
NEUTROS ABS: 4.4 10*3/uL (ref 1.4–7.7)
Neutrophils Relative %: 55.5 % (ref 43.0–77.0)
Platelets: 269 10*3/uL (ref 150.0–400.0)
RBC: 3.32 Mil/uL — AB (ref 3.87–5.11)
RDW: 14.7 % (ref 11.5–15.5)
WBC: 7.9 10*3/uL (ref 4.0–10.5)

## 2015-04-05 LAB — BASIC METABOLIC PANEL
BUN: 40 mg/dL — ABNORMAL HIGH (ref 6–23)
CHLORIDE: 100 meq/L (ref 96–112)
CO2: 31 mEq/L (ref 19–32)
Calcium: 9.5 mg/dL (ref 8.4–10.5)
Creatinine, Ser: 1.78 mg/dL — ABNORMAL HIGH (ref 0.40–1.20)
GFR: 29.18 mL/min — AB (ref >60.00)
Glucose, Bld: 118 mg/dL — ABNORMAL HIGH (ref 70–99)
Potassium: 4.3 mEq/L (ref 3.5–5.1)
Sodium: 137 mEq/L (ref 135–145)

## 2015-04-05 LAB — POCT INR: INR: 2.3

## 2015-04-05 LAB — BRAIN NATRIURETIC PEPTIDE: Pro B Natriuretic peptide (BNP): 171 pg/mL — ABNORMAL HIGH (ref 0.0–100.0)

## 2015-04-05 LAB — PROTIME-INR
INR: 2.3 ratio — ABNORMAL HIGH (ref 0.8–1.0)
Prothrombin Time: 25 s — ABNORMAL HIGH (ref 9.6–13.1)

## 2015-04-05 NOTE — Progress Notes (Signed)
Patient ID: [name], female   DOB: [birth year], 81 y.o.   MRN: [id number]     Cardiology Office Note   Date:  04/05/2015   ID:  [name], DOB [birth year], MRN [id number]  PCP:  Simona Huh, MD    Chief Complaint  Patient presents with  . Follow-up    SOB  CAD   Wt Readings from Last 3 Encounters:  04/05/15 138 lb 12.8 oz (62.959 kg)  11/28/13 141 lb (63.957 kg)  08/10/13 147 lb (66.679 kg)       History of Present Illness: [name] is a 81 y.o. female   Who has had AVR and CABG. She is subsequently had PCI to the RCA system with bare-metal stents. She tries to exercise on a regular basis. Of late, when she tries to exercise, she is experiencing chest discomfort. It feels like an ache in the left side of her chest and left shoulder. It goes away when she rests. She has to strenuous exercise for this symptoms come on. If she sticks to light activity, then she won't have the symptom. Her shortness of breath is also somewhat worse.   Her usual exercise at the gym involves walking on the treadmill for an hour. This typically takes 20 minutes. Now, after just a few minutes, she has chest discomfort and has to stop walking.   she denies any swelling in her legs but has had to take high-dose diuretics for some time.    Past Medical History  Diagnosis Date  . Hypertension   . Hyperlipemia   . CHF (congestive heart failure)   . Arteriovenous malformation of gastrointestinal tract   . Heart murmur   . Anginal pain   . Old MI (myocardial infarction)     "a coulple /dr in 02/2008" (02/11/2013)  . Type II diabetes mellitus   . Anemia   . History of blood transfusion     "today and a couple times over the years" (02/11/2013)  . Arthritis     "fingers mostly" (02/11/2013)  . Chronic lower GI bleeding     "today; last time was ~ 8 yr ago; used to have them often before that too" (02/11/2013)  . Chronic kidney disease     stage 3  . Macula lutea degeneration   .  Coronary artery disease   . DJD (degenerative joint disease)     Past Surgical History  Procedure Laterality Date  . Appendectomy  1953  . Cardiac catheterization  ~1990  . Coronary angioplasty with stent placement  2009+    "3 at least; put in 1 stent at a time" (02/11/2013)  . Aortic valve replacement  ~ 1993  . Coronary artery bypass graft  1990    (CABG X4" (02/11/2013)  . Cardiac valve replacement  1994    St. Jude/notes 10/29/2003 (02/11/2013)  . Exploratory laparotomy  02/24/2008    which revealed a retroperitoneal hematoma and bleeding from the right external iliac artery/notes 03/02/2008 (02/11/2013)   . Dilation and curettage of uterus  1960's    'after a miscarriage" (02/11/2013)  . Enteroscopy N/A 02/13/2013    Procedure: ENTEROSCOPY;  Surgeon: Lafayette Dragon, MD;  Location: [facility] ENDOSCOPY;  Service: Endoscopy;  Laterality: N/A;  . Colonoscopy N/A 02/13/2013    Procedure: COLONOSCOPY;  Surgeon: Lafayette Dragon, MD;  Location: [facility] ENDOSCOPY;  Service: Endoscopy;  Laterality: N/A;     Current Outpatient Prescriptions  Medication Sig Dispense Refill  . aspirin 81  MG tablet Take 81 mg by mouth daily.    Marland Kitchen atorvastatin (LIPITOR) 80 MG tablet TAKE ONE TABLET BY MOUTH ONCE DAILY 30 tablet 3  . Canagliflozin (INVOKANA) 100 MG TABS Take by mouth daily.    Marland Kitchen COUMADIN 5 MG tablet TAKE AS DIRECTED BY  COUMADIN  CLINIC 40 tablet 3  . digoxin (LANOXIN) 0.125 MG tablet Take 0.125 mg by mouth daily.    Marland Kitchen FeFum-FePo-FA-B Cmp-C-Zn-Mn-Cu (TANDEM PLUS PO) Take 1 tablet by mouth 2 (two) times daily.    . folic acid (FOLVITE) A999333 MCG tablet Take 400 mcg by mouth every evening.     . furosemide (LASIX) 80 MG tablet TAKE ONE TABLET BY MOUTH TWICE DAILY 60 tablet 6  . glimepiride (AMARYL) 4 MG tablet Take 4 mg by mouth daily before breakfast.    . isosorbide mononitrate (IMDUR) 60 MG 24 hr tablet TAKE ONE TABLET BY MOUTH ONCE DAILY 30 tablet 6  . JANUVIA 50 MG tablet Take 50 mg by mouth daily.     .  metoprolol (LOPRESSOR) 50 MG tablet Take 50 mg by mouth 2 (two) times daily.    . Multiple Vitamin (MULTIVITAMIN WITH MINERALS) TABS Take 1 tablet by mouth daily.    . nitroGLYCERIN (NITROSTAT) 0.4 MG SL tablet Place 0.4 mg under the tongue every 5 (five) minutes as needed for chest pain.    . Omega-3 Fatty Acids 1200 MG CAPS Take 3 capsules by mouth 2 (two) times daily.    Marland Kitchen omeprazole (PRILOSEC) 20 MG capsule Take 1 capsule (20 mg total) by mouth daily. 30 capsule 0  . potassium chloride (K-DUR,KLOR-CON) 10 MEQ tablet Take 20 mEq by mouth 2 (two) times daily.     . valsartan (DIOVAN) 80 MG tablet Take 80 mg by mouth daily.    Marland Kitchen ZETIA 10 MG tablet TAKE ONE TABLET BY MOUTH ONCE DAILY 90 tablet 0   No current facility-administered medications for this visit.    Allergies:   Darvon; Lisinopril; Septra; and Penicillins    Social History:  The patient  reports that she has never smoked. She has never used smokeless tobacco. She reports that she does not drink alcohol or use illicit drugs.   Family History:  The patient's *family history includes Heart disease in her father and mother.    ROS:  Please see the history of present illness.   Otherwise, review of systems are positive for  Chest pain.   All other systems are reviewed and negative.    PHYSICAL EXAM: VS:  BP 114/56 mmHg  Pulse 62  Ht 5' (1.524 m)  Wt 138 lb 12.8 oz (62.959 kg)  BMI 27.11 kg/m2 , BMI Body mass index is 27.11 kg/(m^2). GEN: Well nourished, well developed, in no acute distress HEENT: normal Neck: no JVD, carotid bruits, or masses Cardiac: RRR; no murmurs, rubs, or gallops,no edema  Respiratory:  clear to auscultation bilaterally, normal work of breathing GI: soft, nontender, nondistended, + BS MS: no deformity or atrophy Skin: warm and dry, no rash Neuro:  Strength and sensation are intact Psych: euthymic mood, full affect   EKG:   The ekg ordered today demonstrates  Normal sinus rhythm, septal Q waves. No  significant ST segment changes   Recent Labs: 11/21/2014: ALT 17   Lipid Panel    Component Value Date/Time   CHOL 144 11/21/2014 1022   TRIG 198* 11/21/2014 1022   HDL 46 11/21/2014 1022   LDLCALC 58 11/21/2014 1022  Other studies Reviewed: Additional studies/ records that were reviewed today with results demonstrating:  Echocardiogram from 2009 show normal left ventricular function.   ASSESSMENT AND PLAN:  1.  angina: symptoms concerning for worsening ischemia. She is already on 2 antianginal medicines and having chest discomfort with low-level exercise. The risks and benefits of cardiac catheter explained and she is willing to proceed. We'll also check labs today. 2.  shortness of breath: Check BNP. Check echocardiogram to evaluate prosthetic valve function. 3.  anticoagulation: INR to be checked today. INR was 2.3. Will continue Coumadin until Sunday. Plan to stop Coumadin after that in anticipation of cardiac cath next Thursday. 4.  history of GI bleeding:  This is been stable for some time. Would have to consider bare-metal stent if PCI as needed to avoid the need for prolonged antiplatelets therapy.   Current medicines are reviewed at length with the patient today.  The patient concerns regarding her medicines were addressed.  The following changes have been made:  No change  Labs/ tests ordered today include:  No orders of the defined types were placed in this encounter.    Recommend 150 minutes/week of aerobic exercise Low fat, low carb, high fiber diet recommended  Disposition:   FU in post cath   Teresita Madura., MD  04/05/2015 3:17 PM    [facility] [address], [place], [place]  [address] Phone: [phone]; Fax: [phone]

## 2015-04-05 NOTE — Patient Instructions (Addendum)
**Note De-Identified  Obfuscation** Medication Instructions:  Same-No change  Labwork: Today  Testing/Procedures: Your physician has requested that you have an echocardiogram. Echocardiography is a painless test that uses sound waves to create images of your heart. It provides your doctor with information about the size and shape of your heart and how well your heart's chambers and valves are working. This procedure takes approximately one hour. There are no restrictions for this procedure. To be done before cath that is scheduled on 04/12/15.   Your physician has requested that you have a cardiac catheterization. Cardiac catheterization is used to diagnose and/or treat various heart conditions. Doctors may recommend this procedure for a number of different reasons. The most common reason is to evaluate chest pain. Chest pain can be a symptom of coronary artery disease (CAD), and cardiac catheterization can show whether plaque is narrowing or blocking your heart's arteries. This procedure is also used to evaluate the valves, as well as measure the blood flow and oxygen levels in different parts of your heart. For further information please visit [email or web address]. Please follow instruction sheet, as given.   Follow-Up: Your physician recommends that you schedule a follow-up appointment in: after cath

## 2015-04-06 ENCOUNTER — Telehealth: Payer: Self-pay | Admitting: Interventional Cardiology

## 2015-04-06 MED ORDER — NITROGLYCERIN 0.4 MG SL SUBL: 0.4000 mg | SUBLINGUAL_TABLET | SUBLINGUAL | Status: DC | PRN

## 2015-04-06 NOTE — Telephone Encounter (Signed)
Follow UP  Pt returned call from [facility]. Please call

## 2015-04-06 NOTE — Telephone Encounter (Signed)
Informed patient of lab results. She also requested refill for NTG to be sent to [facility] -- informed her that I would take care of this for her.

## 2015-04-09 ENCOUNTER — Encounter (HOSPITAL_COMMUNITY): Payer: Self-pay | Admitting: Pharmacy Technician

## 2015-04-10 ENCOUNTER — Ambulatory Visit (HOSPITAL_COMMUNITY): Payer: Medicare Other | Attending: Cardiology

## 2015-04-10 ENCOUNTER — Other Ambulatory Visit: Payer: Self-pay

## 2015-04-10 DIAGNOSIS — I351 Nonrheumatic aortic (valve) insufficiency: Secondary | ICD-10-CM | POA: Diagnosis not present

## 2015-04-10 DIAGNOSIS — I10 Essential (primary) hypertension: Secondary | ICD-10-CM | POA: Insufficient documentation

## 2015-04-10 DIAGNOSIS — I340 Nonrheumatic mitral (valve) insufficiency: Secondary | ICD-10-CM | POA: Diagnosis not present

## 2015-04-10 DIAGNOSIS — R0602 Shortness of breath: Secondary | ICD-10-CM | POA: Diagnosis present

## 2015-04-10 DIAGNOSIS — E785 Hyperlipidemia, unspecified: Secondary | ICD-10-CM | POA: Insufficient documentation

## 2015-04-11 ENCOUNTER — Other Ambulatory Visit: Payer: Self-pay | Admitting: Interventional Cardiology

## 2015-04-11 ENCOUNTER — Telehealth: Payer: Self-pay | Admitting: Interventional Cardiology

## 2015-04-11 DIAGNOSIS — I209 Angina pectoris, unspecified: Secondary | ICD-10-CM

## 2015-04-11 NOTE — Telephone Encounter (Signed)
Pt called and states that she is scheduled to have a cath tomorrow, 04/12/15. Pt states that she feels like she is having some "internal bleeding." Pt states that she "has some little veins that bleed in stomach." Pt states that this bleeding occurs a couple of times a year. Pt states she is having dark stools but does not see visible bleeding. Pt says she has no changes in symptoms since her appt 04/05/15 (states she feels the exact same).  Pt states that she just wanted to make Dr. Irish Lack aware so that he could "give her blood if she needed it." Pt states that this bleeding issue is usually handled by Dr. Olevia Perches. Advised pt to make Dr. Nichola Sizer office aware. Will route to Dr. Irish Lack for review and advisement.

## 2015-04-12 ENCOUNTER — Encounter (HOSPITAL_COMMUNITY): Payer: Self-pay | Admitting: Interventional Cardiology

## 2015-04-12 ENCOUNTER — Ambulatory Visit (HOSPITAL_BASED_OUTPATIENT_CLINIC_OR_DEPARTMENT_OTHER)
Admission: RE | Admit: 2015-04-12 | Discharge: 2015-04-13 | Disposition: A | Discharge status: Home / Self Care | Payer: Medicare Other | Source: Ambulatory Visit | Attending: Interventional Cardiology | Admitting: Interventional Cardiology

## 2015-04-12 ENCOUNTER — Other Ambulatory Visit: Payer: Self-pay

## 2015-04-12 ENCOUNTER — Encounter (HOSPITAL_COMMUNITY)
Admission: RE | Disposition: A | Discharge status: Home / Self Care | Payer: Medicare Other | Source: Ambulatory Visit | Attending: Interventional Cardiology

## 2015-04-12 DIAGNOSIS — I252 Old myocardial infarction: Secondary | ICD-10-CM

## 2015-04-12 DIAGNOSIS — R0789 Other chest pain: Secondary | ICD-10-CM | POA: Diagnosis not present

## 2015-04-12 DIAGNOSIS — E1122 Type 2 diabetes mellitus with diabetic chronic kidney disease: Secondary | ICD-10-CM | POA: Diagnosis not present

## 2015-04-12 DIAGNOSIS — I25119 Atherosclerotic heart disease of native coronary artery with unspecified angina pectoris: Secondary | ICD-10-CM | POA: Diagnosis not present

## 2015-04-12 DIAGNOSIS — I2582 Chronic total occlusion of coronary artery: Secondary | ICD-10-CM

## 2015-04-12 DIAGNOSIS — I4891 Unspecified atrial fibrillation: Secondary | ICD-10-CM | POA: Diagnosis not present

## 2015-04-12 DIAGNOSIS — I509 Heart failure, unspecified: Secondary | ICD-10-CM | POA: Insufficient documentation

## 2015-04-12 DIAGNOSIS — Z955 Presence of coronary angioplasty implant and graft: Secondary | ICD-10-CM | POA: Insufficient documentation

## 2015-04-12 DIAGNOSIS — I248 Other forms of acute ischemic heart disease: Secondary | ICD-10-CM | POA: Diagnosis present

## 2015-04-12 DIAGNOSIS — D649 Anemia, unspecified: Secondary | ICD-10-CM | POA: Insufficient documentation

## 2015-04-12 DIAGNOSIS — M1990 Unspecified osteoarthritis, unspecified site: Secondary | ICD-10-CM | POA: Diagnosis present

## 2015-04-12 DIAGNOSIS — K219 Gastro-esophageal reflux disease without esophagitis: Secondary | ICD-10-CM | POA: Diagnosis present

## 2015-04-12 DIAGNOSIS — R079 Chest pain, unspecified: Secondary | ICD-10-CM | POA: Diagnosis not present

## 2015-04-12 DIAGNOSIS — R7989 Other specified abnormal findings of blood chemistry: Secondary | ICD-10-CM | POA: Diagnosis not present

## 2015-04-12 DIAGNOSIS — I209 Angina pectoris, unspecified: Secondary | ICD-10-CM | POA: Diagnosis present

## 2015-04-12 DIAGNOSIS — E785 Hyperlipidemia, unspecified: Secondary | ICD-10-CM | POA: Insufficient documentation

## 2015-04-12 DIAGNOSIS — N183 Chronic kidney disease, stage 3 (moderate): Secondary | ICD-10-CM | POA: Diagnosis present

## 2015-04-12 DIAGNOSIS — E872 Acidosis: Secondary | ICD-10-CM | POA: Diagnosis present

## 2015-04-12 DIAGNOSIS — I129 Hypertensive chronic kidney disease with stage 1 through stage 4 chronic kidney disease, or unspecified chronic kidney disease: Secondary | ICD-10-CM | POA: Diagnosis present

## 2015-04-12 DIAGNOSIS — I2584 Coronary atherosclerosis due to calcified coronary lesion: Secondary | ICD-10-CM

## 2015-04-12 DIAGNOSIS — D62 Acute posthemorrhagic anemia: Secondary | ICD-10-CM | POA: Diagnosis not present

## 2015-04-12 DIAGNOSIS — I25719 Atherosclerosis of autologous vein coronary artery bypass graft(s) with unspecified angina pectoris: Secondary | ICD-10-CM | POA: Diagnosis present

## 2015-04-12 DIAGNOSIS — I482 Chronic atrial fibrillation: Secondary | ICD-10-CM | POA: Diagnosis present

## 2015-04-12 DIAGNOSIS — K640 First degree hemorrhoids: Secondary | ICD-10-CM | POA: Diagnosis present

## 2015-04-12 DIAGNOSIS — E119 Type 2 diabetes mellitus without complications: Secondary | ICD-10-CM

## 2015-04-12 DIAGNOSIS — Z7902 Long term (current) use of antithrombotics/antiplatelets: Secondary | ICD-10-CM

## 2015-04-12 DIAGNOSIS — Z951 Presence of aortocoronary bypass graft: Secondary | ICD-10-CM

## 2015-04-12 DIAGNOSIS — Z7901 Long term (current) use of anticoagulants: Secondary | ICD-10-CM

## 2015-04-12 DIAGNOSIS — K5520 Angiodysplasia of colon without hemorrhage: Secondary | ICD-10-CM | POA: Diagnosis present

## 2015-04-12 DIAGNOSIS — N179 Acute kidney failure, unspecified: Secondary | ICD-10-CM | POA: Diagnosis not present

## 2015-04-12 DIAGNOSIS — K644 Residual hemorrhoidal skin tags: Secondary | ICD-10-CM | POA: Diagnosis present

## 2015-04-12 DIAGNOSIS — K922 Gastrointestinal hemorrhage, unspecified: Secondary | ICD-10-CM | POA: Diagnosis not present

## 2015-04-12 DIAGNOSIS — I2510 Atherosclerotic heart disease of native coronary artery without angina pectoris: Secondary | ICD-10-CM | POA: Diagnosis present

## 2015-04-12 DIAGNOSIS — I480 Paroxysmal atrial fibrillation: Secondary | ICD-10-CM | POA: Diagnosis present

## 2015-04-12 DIAGNOSIS — Z7982 Long term (current) use of aspirin: Secondary | ICD-10-CM | POA: Insufficient documentation

## 2015-04-12 DIAGNOSIS — Z952 Presence of prosthetic heart valve: Secondary | ICD-10-CM

## 2015-04-12 DIAGNOSIS — R531 Weakness: Secondary | ICD-10-CM | POA: Diagnosis not present

## 2015-04-12 DIAGNOSIS — K921 Melena: Secondary | ICD-10-CM | POA: Diagnosis not present

## 2015-04-12 HISTORY — DX: Chronic kidney disease, stage 3 (moderate): N18.3

## 2015-04-12 HISTORY — PX: CARDIAC CATHETERIZATION: SHX172

## 2015-04-12 HISTORY — DX: Chronic kidney disease, stage 3 unspecified: N18.30

## 2015-04-12 HISTORY — DX: Personal history of other diseases of the musculoskeletal system and connective tissue: Z87.39

## 2015-04-12 LAB — GLUCOSE, CAPILLARY
GLUCOSE-CAPILLARY: 156 mg/dL — AB (ref 65–99)
GLUCOSE-CAPILLARY: 145 mg/dL — AB (ref 65–99)
Glucose-Capillary: 167 mg/dL — ABNORMAL HIGH (ref 65–99)
Glucose-Capillary: 72 mg/dL (ref 65–99)

## 2015-04-12 LAB — POCT ACTIVATED CLOTTING TIME
ACTIVATED CLOTTING TIME: 263 s
Activated Clotting Time: 257 seconds

## 2015-04-12 LAB — PROTIME-INR
INR: 1.24 (ref 0.00–1.49)
PROTHROMBIN TIME: 15.8 s — AB (ref 11.6–15.2)

## 2015-04-12 SURGERY — CORONARY STENT INTERVENTION

## 2015-04-12 MED ORDER — ADULT MULTIVITAMIN W/MINERALS CH
1.0000 | ORAL_TABLET | Freq: Every day | ORAL | Status: DC
  Administered 2015-04-12 – 2015-04-13 (×2): 1 via ORAL
  Filled 2015-04-12 (×2): qty 1

## 2015-04-12 MED ORDER — SODIUM CHLORIDE 0.9 % WEIGHT BASED INFUSION
3.0000 mL/kg/h | INTRAVENOUS | Status: DC
  Administered 2015-04-12: 3 mL/kg/h via INTRAVENOUS

## 2015-04-12 MED ORDER — PANTOPRAZOLE SODIUM 40 MG PO TBEC: 40.0000 mg | DELAYED_RELEASE_TABLET | Freq: Every day | ORAL | Status: DC

## 2015-04-12 MED ORDER — VERAPAMIL HCL 2.5 MG/ML IV SOLN
INTRAVENOUS | Status: DC | PRN
  Administered 2015-04-12: 10:00:00 via INTRA_ARTERIAL

## 2015-04-12 MED ORDER — SODIUM CHLORIDE 0.9 % IJ SOLN
3.0000 mL | Freq: Two times a day (BID) | INTRAMUSCULAR | Status: DC
  Administered 2015-04-13: 10:00:00 3 mL via INTRAVENOUS

## 2015-04-12 MED ORDER — VERAPAMIL HCL 2.5 MG/ML IV SOLN
INTRAVENOUS | Status: AC
  Filled 2015-04-12: qty 2

## 2015-04-12 MED ORDER — FUROSEMIDE 80 MG PO TABS
80.0000 mg | ORAL_TABLET | Freq: Two times a day (BID) | ORAL | Status: DC
  Administered 2015-04-12 – 2015-04-13 (×2): 80 mg via ORAL
  Filled 2015-04-12: qty 2
  Filled 2015-04-12 (×3): qty 1
  Filled 2015-04-12: qty 2
  Filled 2015-04-12: qty 1

## 2015-04-12 MED ORDER — SODIUM BICARBONATE BOLUS VIA INFUSION
INTRAVENOUS | Status: AC
  Administered 2015-04-12: 08:00:00 via INTRAVENOUS
  Filled 2015-04-12: qty 1

## 2015-04-12 MED ORDER — HEPARIN (PORCINE) IN NACL 2-0.9 UNIT/ML-% IJ SOLN
INTRAMUSCULAR | Status: AC
  Filled 2015-04-12: qty 1000

## 2015-04-12 MED ORDER — EZETIMIBE 10 MG PO TABS
10.0000 mg | ORAL_TABLET | Freq: Every day | ORAL | Status: DC
  Administered 2015-04-12 – 2015-04-13 (×2): 10 mg via ORAL
  Filled 2015-04-12 (×2): qty 1

## 2015-04-12 MED ORDER — MIDAZOLAM HCL 2 MG/2ML IJ SOLN
INTRAMUSCULAR | Status: AC
  Filled 2015-04-12: qty 2

## 2015-04-12 MED ORDER — HEPARIN SODIUM (PORCINE) 1000 UNIT/ML IJ SOLN
INTRAMUSCULAR | Status: AC
  Filled 2015-04-12: qty 1

## 2015-04-12 MED ORDER — ONDANSETRON HCL 4 MG/2ML IJ SOLN: 4.0000 mg | Freq: Four times a day (QID) | INTRAMUSCULAR | Status: DC | PRN

## 2015-04-12 MED ORDER — METOPROLOL TARTRATE 50 MG PO TABS
50.0000 mg | ORAL_TABLET | Freq: Two times a day (BID) | ORAL | Status: DC
  Administered 2015-04-12 – 2015-04-13 (×2): 50 mg via ORAL
  Filled 2015-04-12: qty 1
  Filled 2015-04-12: qty 2
  Filled 2015-04-12: qty 1
  Filled 2015-04-12: qty 2
  Filled 2015-04-12: qty 1

## 2015-04-12 MED ORDER — DIGOXIN 125 MCG PO TABS
0.1250 mg | ORAL_TABLET | Freq: Every day | ORAL | Status: DC
  Administered 2015-04-13: 0.125 mg via ORAL
  Filled 2015-04-12: qty 1

## 2015-04-12 MED ORDER — FENTANYL CITRATE (PF) 100 MCG/2ML IJ SOLN
INTRAMUSCULAR | Status: AC
  Filled 2015-04-12: qty 2

## 2015-04-12 MED ORDER — SODIUM BICARBONATE 8.4 % IV SOLN
INTRAVENOUS | Status: AC
  Administered 2015-04-12: 09:00:00 via INTRAVENOUS
  Filled 2015-04-12: qty 1000

## 2015-04-12 MED ORDER — ATORVASTATIN CALCIUM 80 MG PO TABS
80.0000 mg | ORAL_TABLET | Freq: Every day | ORAL | Status: DC
  Administered 2015-04-12 – 2015-04-13 (×2): 80 mg via ORAL
  Filled 2015-04-12 (×2): qty 1

## 2015-04-12 MED ORDER — SODIUM CHLORIDE 0.9 % IV SOLN: INTRAVENOUS | Status: AC

## 2015-04-12 MED ORDER — SODIUM CHLORIDE 0.9 % IV SOLN: 250.0000 mL | INTRAVENOUS | Status: DC | PRN

## 2015-04-12 MED ORDER — SODIUM CHLORIDE 0.9 % IV SOLN
INTRAVENOUS | Status: DC
  Administered 2015-04-12: 22:00:00 via INTRAVENOUS

## 2015-04-12 MED ORDER — LIDOCAINE HCL (PF) 1 % IJ SOLN
INTRAMUSCULAR | Status: AC
  Filled 2015-04-12: qty 30

## 2015-04-12 MED ORDER — CLOPIDOGREL BISULFATE 300 MG PO TABS
ORAL_TABLET | ORAL | Status: AC
  Filled 2015-04-12: qty 1

## 2015-04-12 MED ORDER — HEPARIN SODIUM (PORCINE) 1000 UNIT/ML IJ SOLN
INTRAMUSCULAR | Status: DC | PRN
  Administered 2015-04-12: 3000 [IU] via INTRAVENOUS
  Administered 2015-04-12: 5000 [IU] via INTRAVENOUS
  Administered 2015-04-12: 4000 [IU] via INTRAVENOUS
  Administered 2015-04-12: 2000 [IU] via INTRAVENOUS

## 2015-04-12 MED ORDER — ASPIRIN 81 MG PO CHEW: 81.0000 mg | CHEWABLE_TABLET | ORAL | Status: DC

## 2015-04-12 MED ORDER — CLOPIDOGREL BISULFATE 300 MG PO TABS
ORAL_TABLET | ORAL | Status: DC | PRN
  Administered 2015-04-12: 600 mg via ORAL

## 2015-04-12 MED ORDER — ISOSORBIDE MONONITRATE ER 60 MG PO TB24
60.0000 mg | ORAL_TABLET | Freq: Every day | ORAL | Status: DC
  Administered 2015-04-12 – 2015-04-13 (×2): 60 mg via ORAL
  Filled 2015-04-12 (×3): qty 1

## 2015-04-12 MED ORDER — POTASSIUM CHLORIDE CRYS ER 20 MEQ PO TBCR
20.0000 meq | EXTENDED_RELEASE_TABLET | Freq: Two times a day (BID) | ORAL | Status: DC
  Administered 2015-04-12 – 2015-04-13 (×2): 20 meq via ORAL
  Filled 2015-04-12 (×2): qty 1

## 2015-04-12 MED ORDER — PANTOPRAZOLE SODIUM 40 MG PO TBEC
40.0000 mg | DELAYED_RELEASE_TABLET | Freq: Every day | ORAL | Status: DC
  Administered 2015-04-12: 19:00:00 40 mg via ORAL
  Filled 2015-04-12 (×2): qty 1

## 2015-04-12 MED ORDER — ANGIOPLASTY BOOK
Freq: Once | Status: AC
Start: 2015-04-12 — End: 2015-04-12
  Administered 2015-04-12: 22:00:00
  Filled 2015-04-12: qty 1

## 2015-04-12 MED ORDER — WARFARIN - PHYSICIAN DOSING INPATIENT: Freq: Every day | Status: DC

## 2015-04-12 MED ORDER — IOHEXOL 350 MG/ML SOLN
INTRAVENOUS | Status: DC | PRN
  Administered 2015-04-12: 85 mL via INTRA_ARTERIAL

## 2015-04-12 MED ORDER — MIDAZOLAM HCL 2 MG/2ML IJ SOLN
INTRAMUSCULAR | Status: DC | PRN
  Administered 2015-04-12 (×2): 1 mg via INTRAVENOUS

## 2015-04-12 MED ORDER — HEPARIN (PORCINE) IN NACL 100-0.45 UNIT/ML-% IJ SOLN
900.0000 [IU]/h | INTRAMUSCULAR | Status: DC
  Administered 2015-04-12: 22:00:00 700 [IU]/h via INTRAVENOUS
  Filled 2015-04-12: qty 250

## 2015-04-12 MED ORDER — ACETAMINOPHEN 325 MG PO TABS: 650.0000 mg | ORAL_TABLET | ORAL | Status: DC | PRN

## 2015-04-12 MED ORDER — NITROGLYCERIN 0.4 MG SL SUBL: 0.4000 mg | SUBLINGUAL_TABLET | SUBLINGUAL | Status: DC | PRN

## 2015-04-12 MED ORDER — SODIUM CHLORIDE 0.9 % WEIGHT BASED INFUSION: 1.0000 mL/kg/h | INTRAVENOUS | Status: DC

## 2015-04-12 MED ORDER — WARFARIN SODIUM 5 MG PO TABS
5.0000 mg | ORAL_TABLET | Freq: Every day | ORAL | Status: AC
  Administered 2015-04-12: 5 mg via ORAL
  Filled 2015-04-12: qty 1

## 2015-04-12 MED ORDER — SODIUM CHLORIDE 0.9 % IJ SOLN: 3.0000 mL | INTRAMUSCULAR | Status: DC | PRN

## 2015-04-12 MED ORDER — GLIMEPIRIDE 4 MG PO TABS
4.0000 mg | ORAL_TABLET | Freq: Every day | ORAL | Status: DC
  Administered 2015-04-13: 4 mg via ORAL
  Filled 2015-04-12 (×2): qty 1

## 2015-04-12 MED ORDER — FENTANYL CITRATE (PF) 100 MCG/2ML IJ SOLN
INTRAMUSCULAR | Status: DC | PRN
  Administered 2015-04-12 (×2): 25 ug via INTRAVENOUS

## 2015-04-12 MED ORDER — CANAGLIFLOZIN 100 MG PO TABS
100.0000 mg | ORAL_TABLET | Freq: Every day | ORAL | Status: DC
  Administered 2015-04-12: 13:00:00 100 mg via ORAL
  Filled 2015-04-12: qty 1

## 2015-04-12 MED ORDER — CLOPIDOGREL BISULFATE 75 MG PO TABS
75.0000 mg | ORAL_TABLET | Freq: Every day | ORAL | Status: DC
  Administered 2015-04-13: 10:00:00 75 mg via ORAL
  Filled 2015-04-12: qty 1

## 2015-04-12 MED ORDER — LINAGLIPTIN 5 MG PO TABS
5.0000 mg | ORAL_TABLET | Freq: Every day | ORAL | Status: DC
  Administered 2015-04-12 – 2015-04-13 (×2): 5 mg via ORAL
  Filled 2015-04-12 (×2): qty 1

## 2015-04-12 SURGICAL SUPPLY — 22 items
BALLN EMERGE MR 2.5X15 (BALLOONS) ×4
BALLOON EMERGE MR 2.5X15 (BALLOONS) ×1 IMPLANT
CATH INFINITI 5 FR IM (CATHETERS) ×3 IMPLANT
CATH INFINITI 5 FR JL3.5 (CATHETERS) ×4 IMPLANT
CATH INFINITI 5FR ANG PIGTAIL (CATHETERS) ×4 IMPLANT
CATH INFINITI 5FR MULTPACK ANG (CATHETERS) IMPLANT
CATH INFINITI JR4 5F (CATHETERS) ×4 IMPLANT
DEVICE RAD COMP TR BAND LRG (VASCULAR PRODUCTS) ×4 IMPLANT
GLIDESHEATH SLEND SS 6F .021 (SHEATH) ×4 IMPLANT
GUIDE CATH RUNWAY 6FR FR4 SH (CATHETERS) ×3 IMPLANT
KIT ENCORE 26 ADVANTAGE (KITS) ×3 IMPLANT
KIT HEART LEFT (KITS) ×4 IMPLANT
PACK CARDIAC CATHETERIZATION (CUSTOM PROCEDURE TRAY) ×4 IMPLANT
SHEATH PINNACLE 5F 10CM (SHEATH) IMPLANT
STENT SYNERGY DES 3.5X16 (Permanent Stent) ×3 IMPLANT
TRANSDUCER W/STOPCOCK (MISCELLANEOUS) ×4 IMPLANT
TUBING CIL FLEX 10 FLL-RA (TUBING) ×4 IMPLANT
VALVE GUARDIAN II ~~LOC~~ HEMO (MISCELLANEOUS) ×3 IMPLANT
WIRE ASAHI PROWATER 180CM (WIRE) ×6 IMPLANT
WIRE EMERALD 3MM-J .035X150CM (WIRE) IMPLANT
WIRE HI TORQ VERSACORE-J 145CM (WIRE) ×3 IMPLANT
WIRE SAFE-T 1.5MM-J .035X260CM (WIRE) ×4 IMPLANT

## 2015-04-12 NOTE — Interval H&P Note (Signed)
Cath Lab Visit (complete for each Cath Lab visit)  Clinical Evaluation Leading to the Procedure:   ACS: No.  Non-ACS:    Anginal Classification: CCS III  Anti-ischemic medical therapy: Maximal Therapy (2 or more classes of medications)  Non-Invasive Test Results: No non-invasive testing performed  Prior CABG: Previous CABG   Ischemic Symptoms? CCS III (Marked limitation of ordinary activity) Anti-ischemic Medical Therapy? Maximal Medical Therapy (2 or more classes of medications) Non-invasive Test Results? No non-invasive testing performed Prior CABG? No Previous CABG   Patient Information:   1-2V CAD, no prox LAD  A (7)  Indication: 20; Score: 7   Patient Information:   1-2V-CAD with DS 50-60% With No FFR, No IVUS  I (3)  Indication: 21; Score: 3   Patient Information:   1-2V-CAD with DS 50-60% With FFR  A (7)  Indication: 22; Score: 7   Patient Information:   1-2V-CAD with DS 50-60% With FFR>0.8, IVUS not significant  I (2)  Indication: 23; Score: 2   Patient Information:   3V-CAD without LMCA With Abnormal LV systolic function  A (9)  Indication: 48; Score: 9   Patient Information:   LMCA-CAD  A (9)  Indication: 49; Score: 9   Patient Information:   2V-CAD with prox LAD PCI  A (7)  Indication: 62; Score: 7   Patient Information:   2V-CAD with prox LAD CABG  A (8)  Indication: 62; Score: 8   Patient Information:   3V-CAD without LMCA With Low CAD burden(i.e., 3 focal stenoses, low SYNTAX score) PCI  A (7)  Indication: 63; Score: 7   Patient Information:   3V-CAD without LMCA With Low CAD burden(i.e., 3 focal stenoses, low SYNTAX score) CABG  A (9)  Indication: 63; Score: 9   Patient Information:   3V-CAD without LMCA E06c - Intermediate-high CAD burden (i.e., multiple diffuse lesions, presence of CTO, or high SYNTAX score) PCI  U (4)  Indication: 64; Score: 4   Patient Information:   3V-CAD without  LMCA E06c - Intermediate-high CAD burden (i.e., multiple diffuse lesions, presence of CTO, or high SYNTAX score) CABG  A (9)  Indication: 64; Score: 9   Patient Information:   LMCA-CAD With Isolated LMCA stenosis  PCI  U (6)  Indication: 65; Score: 6   Patient Information:   LMCA-CAD With Isolated LMCA stenosis  CABG  A (9)  Indication: 65; Score: 9   Patient Information:   LMCA-CAD Additional CAD, low CAD burden (i.e., 1- to 2-vessel additional involvement, low SYNTAX score) PCI  U (5)  Indication: 66; Score: 5   Patient Information:   LMCA-CAD Additional CAD, low CAD burden (i.e., 1- to 2-vessel additional involvement, low SYNTAX score) CABG  A (9)  Indication: 66; Score: 9   Patient Information:   LMCA-CAD Additional CAD, intermediate-high CAD burden (i.e., 3-vessel involvement, presence of CTO, or high SYNTAX score) PCI  I (3)  Indication: 67; Score: 3   Patient Information:   LMCA-CAD Additional CAD, intermediate-high CAD burden (i.e., 3-vessel involvement, presence of CTO, or high SYNTAX score) CABG  A (9)  Indication: 67; Score: 9    History and Physical Interval Note:  04/12/2015 9:25 AM  [name]  has presented today for surgery, with the diagnosis of cp, sob, cad  The various methods of treatment have been discussed with the patient and family. After consideration of risks, benefits and other options for treatment, the patient has consented to  Procedure(s): Left Heart  Cath and Cors/Grafts Angiography (N/A) as a surgical intervention .  The patient's history has been reviewed, patient examined, no change in status, stable for surgery.  I have reviewed the patient's chart and labs.  Questions were answered to the patient's satisfaction.     VARANASI,JAYADEEP S.

## 2015-04-12 NOTE — H&P (View-Only) (Signed)
Patient ID: [name], female   DOB: 1932/11/10, 81 y.o.   MRN: [id number]     Cardiology Office Note   Date:  04/05/2015   ID:  [name], DOB [birth year], MRN [id number]  PCP:  Simona Huh, MD    Chief Complaint  Patient presents with  . Follow-up    SOB  CAD   Wt Readings from Last 3 Encounters:  04/05/15 138 lb 12.8 oz (62.959 kg)  11/28/13 141 lb (63.957 kg)  08/10/13 147 lb (66.679 kg)       History of Present Illness: [name] is a 81 y.o. female   Who has had AVR and CABG. She is subsequently had PCI to the RCA system with bare-metal stents. She tries to exercise on a regular basis. Of late, when she tries to exercise, she is experiencing chest discomfort. It feels like an ache in the left side of her chest and left shoulder. It goes away when she rests. She has to strenuous exercise for this symptoms come on. If she sticks to light activity, then she won't have the symptom. Her shortness of breath is also somewhat worse.   Her usual exercise at the gym involves walking on the treadmill for an hour. This typically takes 20 minutes. Now, after just a few minutes, she has chest discomfort and has to stop walking.   she denies any swelling in her legs but has had to take high-dose diuretics for some time.    Past Medical History  Diagnosis Date  . Hypertension   . Hyperlipemia   . CHF (congestive heart failure)   . Arteriovenous malformation of gastrointestinal tract   . Heart murmur   . Anginal pain   . Old MI (myocardial infarction)     "a coulple /dr in 02/2008" (02/11/2013)  . Type II diabetes mellitus   . Anemia   . History of blood transfusion     "today and a couple times over the years" (02/11/2013)  . Arthritis     "fingers mostly" (02/11/2013)  . Chronic lower GI bleeding     "today; last time was ~ 8 yr ago; used to have them often before that too" (02/11/2013)  . Chronic kidney disease     stage 3  . Macula lutea degeneration   .  Coronary artery disease   . DJD (degenerative joint disease)     Past Surgical History  Procedure Laterality Date  . Appendectomy  1953  . Cardiac catheterization  ~1990  . Coronary angioplasty with stent placement  2009+    "3 at least; put in 1 stent at a time" (02/11/2013)  . Aortic valve replacement  ~ 1993  . Coronary artery bypass graft  1990    (CABG X4" (02/11/2013)  . Cardiac valve replacement  1994    St. Jude/notes 10/29/2003 (02/11/2013)  . Exploratory laparotomy  02/24/2008    which revealed a retroperitoneal hematoma and bleeding from the right external iliac artery/notes 03/02/2008 (02/11/2013)   . Dilation and curettage of uterus  1960's    'after a miscarriage" (02/11/2013)  . Enteroscopy N/A 02/13/2013    Procedure: ENTEROSCOPY;  Surgeon: Lafayette Dragon, MD;  Location: [facility] ENDOSCOPY;  Service: Endoscopy;  Laterality: N/A;  . Colonoscopy N/A 02/13/2013    Procedure: COLONOSCOPY;  Surgeon: Lafayette Dragon, MD;  Location: [facility] ENDOSCOPY;  Service: Endoscopy;  Laterality: N/A;     Current Outpatient Prescriptions  Medication Sig Dispense Refill  . aspirin 81  MG tablet Take 81 mg by mouth daily.    Marland Kitchen atorvastatin (LIPITOR) 80 MG tablet TAKE ONE TABLET BY MOUTH ONCE DAILY 30 tablet 3  . Canagliflozin (INVOKANA) 100 MG TABS Take by mouth daily.    Marland Kitchen COUMADIN 5 MG tablet TAKE AS DIRECTED BY  COUMADIN  CLINIC 40 tablet 3  . digoxin (LANOXIN) 0.125 MG tablet Take 0.125 mg by mouth daily.    Marland Kitchen FeFum-FePo-FA-B Cmp-C-Zn-Mn-Cu (TANDEM PLUS PO) Take 1 tablet by mouth 2 (two) times daily.    . folic acid (FOLVITE) A999333 MCG tablet Take 400 mcg by mouth every evening.     . furosemide (LASIX) 80 MG tablet TAKE ONE TABLET BY MOUTH TWICE DAILY 60 tablet 6  . glimepiride (AMARYL) 4 MG tablet Take 4 mg by mouth daily before breakfast.    . isosorbide mononitrate (IMDUR) 60 MG 24 hr tablet TAKE ONE TABLET BY MOUTH ONCE DAILY 30 tablet 6  . JANUVIA 50 MG tablet Take 50 mg by mouth daily.     .  metoprolol (LOPRESSOR) 50 MG tablet Take 50 mg by mouth 2 (two) times daily.    . Multiple Vitamin (MULTIVITAMIN WITH MINERALS) TABS Take 1 tablet by mouth daily.    . nitroGLYCERIN (NITROSTAT) 0.4 MG SL tablet Place 0.4 mg under the tongue every 5 (five) minutes as needed for chest pain.    . Omega-3 Fatty Acids 1200 MG CAPS Take 3 capsules by mouth 2 (two) times daily.    Marland Kitchen omeprazole (PRILOSEC) 20 MG capsule Take 1 capsule (20 mg total) by mouth daily. 30 capsule 0  . potassium chloride (K-DUR,KLOR-CON) 10 MEQ tablet Take 20 mEq by mouth 2 (two) times daily.     . valsartan (DIOVAN) 80 MG tablet Take 80 mg by mouth daily.    Marland Kitchen ZETIA 10 MG tablet TAKE ONE TABLET BY MOUTH ONCE DAILY 90 tablet 0   No current facility-administered medications for this visit.    Allergies:   Darvon; Lisinopril; Septra; and Penicillins    Social History:  The patient  reports that she has never smoked. She has never used smokeless tobacco. She reports that she does not drink alcohol or use illicit drugs.   Family History:  The patient's *family history includes Heart disease in her father and mother.    ROS:  Please see the history of present illness.   Otherwise, review of systems are positive for  Chest pain.   All other systems are reviewed and negative.    PHYSICAL EXAM: VS:  BP 114/56 mmHg  Pulse 62  Ht 5' (1.524 m)  Wt 138 lb 12.8 oz (62.959 kg)  BMI 27.11 kg/m2 , BMI Body mass index is 27.11 kg/(m^2). GEN: Well nourished, well developed, in no acute distress HEENT: normal Neck: no JVD, carotid bruits, or masses Cardiac: RRR; no murmurs, rubs, or gallops,no edema  Respiratory:  clear to auscultation bilaterally, normal work of breathing GI: soft, nontender, nondistended, + BS MS: no deformity or atrophy Skin: warm and dry, no rash Neuro:  Strength and sensation are intact Psych: euthymic mood, full affect   EKG:   The ekg ordered today demonstrates  Normal sinus rhythm, septal Q waves. No  significant ST segment changes   Recent Labs: 11/21/2014: ALT 17   Lipid Panel    Component Value Date/Time   CHOL 144 11/21/2014 1022   TRIG 198* 11/21/2014 1022   HDL 46 11/21/2014 1022   LDLCALC 58 11/21/2014 1022  Other studies Reviewed: Additional studies/ records that were reviewed today with results demonstrating:  Echocardiogram from 2009 show normal left ventricular function.   ASSESSMENT AND PLAN:  1.  angina: symptoms concerning for worsening ischemia. She is already on 2 antianginal medicines and having chest discomfort with low-level exercise. The risks and benefits of cardiac catheter explained and she is willing to proceed. We'll also check labs today. 2.  shortness of breath: Check BNP. Check echocardiogram to evaluate prosthetic valve function. 3.  anticoagulation: INR to be checked today. INR was 2.3. Will continue Coumadin until Sunday. Plan to stop Coumadin after that in anticipation of cardiac cath next Thursday. 4.  history of GI bleeding:  This is been stable for some time. Would have to consider bare-metal stent if PCI as needed to avoid the need for prolonged antiplatelets therapy.   Current medicines are reviewed at length with the patient today.  The patient concerns regarding her medicines were addressed.  The following changes have been made:  No change  Labs/ tests ordered today include:  No orders of the defined types were placed in this encounter.    Recommend 150 minutes/week of aerobic exercise Low fat, low carb, high fiber diet recommended  Disposition:   FU in post cath   Teresita Madura., MD  04/05/2015 3:17 PM    [facility] [place], [place], [place]  [address] Phone: [phone]; Fax: [phone]

## 2015-04-12 NOTE — Progress Notes (Signed)
TR BAND REMOVAL  LOCATION:    left radial  DEFLATED PER PROTOCOL:    Yes.    TIME BAND OFF / DRESSING APPLIED:    1615   SITE UPON ARRIVAL:    Level 0  SITE AFTER BAND REMOVAL:    Level 0  REVERSE ALLEN'S TEST:     positive  CIRCULATION SENSATION AND MOVEMENT:    Within Normal Limits   Yes.    COMMENTS:   Tolerated procedure well

## 2015-04-12 NOTE — Progress Notes (Addendum)
ANTICOAGULATION CONSULT NOTE - Initial Consult  Pharmacy Consult for heparin Indication: Mech AVR  Allergies  Allergen Reactions  . Darvon [Propoxyphene] Nausea And Vomiting  . Lisinopril Cough  . Septra [Sulfamethoxazole-Trimethoprim]     Increased INR  . Penicillins Rash    Patient Measurements: Height: 5' (152.4 cm) Weight: 137 lb (62.143 kg) IBW/kg (Calculated) : 45.5 Heparin Dosing Weight: 59kg  Vital Signs: Temp: 97.7 F (36.5 C) (07/21 1300) Temp Source: Oral (07/21 1300) BP: 151/29 mmHg (07/21 1300) Pulse Rate: 75 (07/21 1300)  Labs:  Recent Labs  04/12/15 0729  LABPROT 15.8*  INR 1.24    Estimated Creatinine Clearance: 21.1 mL/min (by C-G formula based on Cr of 1.78).   Medical History: Past Medical History  Diagnosis Date  . Hypertension   . Hyperlipemia   . CHF (congestive heart failure)   . Arteriovenous malformation of gastrointestinal tract   . Heart murmur   . Anginal pain   . Old MI (myocardial infarction)     "a coulple /dr in 02/2008" (02/11/2013)  . Type II diabetes mellitus   . Anemia   . History of blood transfusion     "today and a couple times over the years" (02/11/2013)  . Arthritis     "fingers mostly" (02/11/2013)  . Chronic lower GI bleeding     "today; last time was ~ 8 yr ago; used to have them often before that too" (02/11/2013)  . Chronic kidney disease     stage 3  . Macula lutea degeneration   . Coronary artery disease   . DJD (degenerative joint disease)     Medications:  Prescriptions prior to admission  Medication Sig Dispense Refill Last Dose  . aspirin 81 MG tablet Take 81 mg by mouth daily.   04/12/2015 at 0600  . atorvastatin (LIPITOR) 80 MG tablet TAKE ONE TABLET BY MOUTH ONCE DAILY 30 tablet 3 04/11/2015 at Unknown time  . Canagliflozin (INVOKANA) 100 MG TABS Take 100 mg by mouth daily.    04/11/2015 at Unknown time  . COUMADIN 5 MG tablet TAKE AS DIRECTED BY  COUMADIN  CLINIC 40 tablet 3 04/08/2015  . digoxin  (LANOXIN) 0.125 MG tablet Take 0.125 mg by mouth daily.   04/12/2015 at Unknown time  . FeFum-FePo-FA-B Cmp-C-Zn-Mn-Cu (TANDEM PLUS PO) Take 1 tablet by mouth 2 (two) times daily.   04/12/2015 at Unknown time  . folic acid (FOLVITE) A999333 MCG tablet Take 400 mcg by mouth every evening.    04/11/2015 at Unknown time  . furosemide (LASIX) 80 MG tablet TAKE ONE TABLET BY MOUTH TWICE DAILY 60 tablet 6 04/11/2015 at Unknown time  . glimepiride (AMARYL) 4 MG tablet Take 4 mg by mouth daily before breakfast.   04/11/2015 at Unknown time  . isosorbide mononitrate (IMDUR) 60 MG 24 hr tablet TAKE ONE TABLET BY MOUTH ONCE DAILY 30 tablet 6 04/11/2015 at Unknown time  . JANUVIA 50 MG tablet Take 50 mg by mouth daily.    04/11/2015 at Unknown time  . metoprolol (LOPRESSOR) 50 MG tablet Take 50 mg by mouth 2 (two) times daily.   04/12/2015 at Unknown time  . Multiple Vitamin (MULTIVITAMIN WITH MINERALS) TABS Take 1 tablet by mouth daily.   04/11/2015 at Unknown time  . nitroGLYCERIN (NITROSTAT) 0.4 MG SL tablet Place 1 tablet (0.4 mg total) under the tongue every 5 (five) minutes as needed for chest pain. 25 tablet 1   . Omega-3 Fatty Acids 1200 MG CAPS Take 3  capsules by mouth 2 (two) times daily.   04/12/2015 at Unknown time  . potassium chloride (K-DUR,KLOR-CON) 10 MEQ tablet Take 20 mEq by mouth 2 (two) times daily.    04/12/2015 at Unknown time  . valsartan (DIOVAN) 80 MG tablet Take 80 mg by mouth daily.   04/10/2015  . warfarin (COUMADIN) 5 MG tablet Take 2.5-5 mg by mouth daily. Take 5 mg every day except on Saturday take half of the 5mg  tablet   04/08/2015  . ZETIA 10 MG tablet TAKE ONE TABLET BY MOUTH ONCE DAILY 90 tablet 0 04/11/2015 at Unknown time  . omeprazole (PRILOSEC) 20 MG capsule Take 1 capsule (20 mg total) by mouth daily. 30 capsule 0 More than a month at Unknown time   Scheduled:  . atorvastatin  80 mg Oral Daily  . [START ON 04/13/2015] clopidogrel  75 mg Oral Q breakfast  . [START ON 04/13/2015] digoxin   0.125 mg Oral Daily  . ezetimibe  10 mg Oral Daily  . furosemide  80 mg Oral BID  . [START ON 04/13/2015] glimepiride  4 mg Oral QAC breakfast  . isosorbide mononitrate  60 mg Oral Daily  . linagliptin  5 mg Oral Daily  . metoprolol  50 mg Oral BID  . multivitamin with minerals  1 tablet Oral Daily  . potassium chloride  20 mEq Oral BID  . sodium chloride  3 mL Intravenous Q12H  . warfarin  5 mg Oral q1800    Assessment: 81 yo female s/p cath with DES to the RCA. She is on coumadin PTA for mechanical AVR. Spoke with Dr. Irish Lack and to begin heparin 6 hrs post TR band removal and coumadin to restart tonight. Goal INR is 2-2.5 due to history of GIB. Noted cards plan for- aspirin and Plavix until her Coumadin is therapeutic. At that point, would stop the aspirin and use only clopidogrel and warfarin for her valve. -INR today is 1.24  Home dose was 5mg /day except 2.5mg  on Sat (last clinic visit 7/14; INR was 2.3). Last coumadin dose was 04/08/15   Goal of Therapy:  INR 2-2.5 Heparin level 0.3-0.7 units/ml Monitor platelets by anticoagulation protocol: Yes   Plan -Begin heparin 6 hours post TR band removal at 700 units/hr -Heparin level in 8hrs and daily with CBC daily -Coumadin 5mg  po today -Daily PT/INR  Hildred Laser, Pharm D 04/12/2015 2:02 PM

## 2015-04-13 ENCOUNTER — Encounter (HOSPITAL_COMMUNITY): Payer: Self-pay | Admitting: Physician Assistant

## 2015-04-13 ENCOUNTER — Other Ambulatory Visit: Payer: Self-pay | Admitting: Physician Assistant

## 2015-04-13 DIAGNOSIS — I10 Essential (primary) hypertension: Secondary | ICD-10-CM | POA: Diagnosis not present

## 2015-04-13 DIAGNOSIS — E1122 Type 2 diabetes mellitus with diabetic chronic kidney disease: Secondary | ICD-10-CM | POA: Diagnosis not present

## 2015-04-13 DIAGNOSIS — I209 Angina pectoris, unspecified: Secondary | ICD-10-CM | POA: Diagnosis not present

## 2015-04-13 DIAGNOSIS — D6489 Other specified anemias: Secondary | ICD-10-CM

## 2015-04-13 DIAGNOSIS — I25119 Atherosclerotic heart disease of native coronary artery with unspecified angina pectoris: Secondary | ICD-10-CM | POA: Diagnosis not present

## 2015-04-13 DIAGNOSIS — N183 Chronic kidney disease, stage 3 unspecified: Secondary | ICD-10-CM

## 2015-04-13 DIAGNOSIS — N179 Acute kidney failure, unspecified: Secondary | ICD-10-CM | POA: Diagnosis not present

## 2015-04-13 DIAGNOSIS — E872 Acidosis: Secondary | ICD-10-CM | POA: Diagnosis not present

## 2015-04-13 LAB — BASIC METABOLIC PANEL
Anion gap: 9 (ref 5–15)
BUN: 23 mg/dL — AB (ref 6–20)
CALCIUM: 8.7 mg/dL — AB (ref 8.9–10.3)
CO2: 26 mmol/L (ref 22–32)
CREATININE: 1.62 mg/dL — AB (ref 0.44–1.00)
Chloride: 101 mmol/L (ref 101–111)
GFR calc Af Amer: 34 mL/min — ABNORMAL LOW (ref >60)
GFR calc non Af Amer: 29 mL/min — ABNORMAL LOW (ref >60)
Glucose, Bld: 116 mg/dL — ABNORMAL HIGH (ref 65–99)
Potassium: 4 mmol/L (ref 3.5–5.1)
Sodium: 136 mmol/L (ref 135–145)

## 2015-04-13 LAB — CBC
HCT: 27.4 % — ABNORMAL LOW (ref 36.0–46.0)
HEMOGLOBIN: 9.1 g/dL — AB (ref 12.0–15.0)
MCH: 30.6 pg (ref 26.0–34.0)
MCHC: 33.2 g/dL (ref 30.0–36.0)
MCV: 92.3 fL (ref 78.0–100.0)
PLATELETS: 184 10*3/uL (ref 150–400)
RBC: 2.97 MIL/uL — ABNORMAL LOW (ref 3.87–5.11)
RDW: 14.5 % (ref 11.5–15.5)
WBC: 7.7 10*3/uL (ref 4.0–10.5)

## 2015-04-13 LAB — HEPARIN LEVEL (UNFRACTIONATED)

## 2015-04-13 LAB — PROTIME-INR
INR: 1.29 (ref 0.00–1.49)
PROTHROMBIN TIME: 16.2 s — AB (ref 11.6–15.2)

## 2015-04-13 LAB — GLUCOSE, CAPILLARY: GLUCOSE-CAPILLARY: 121 mg/dL — AB (ref 65–99)

## 2015-04-13 LAB — DIGOXIN LEVEL: Digoxin Level: 1.1 ng/mL (ref 0.8–2.0)

## 2015-04-13 MED ORDER — WARFARIN - PHARMACIST DOSING INPATIENT: Freq: Every day | Status: DC

## 2015-04-13 MED ORDER — CLOPIDOGREL BISULFATE 75 MG PO TABS: 75.0000 mg | ORAL_TABLET | Freq: Every day | ORAL | Status: DC

## 2015-04-13 MED ORDER — PANTOPRAZOLE SODIUM 40 MG PO TBEC: 40.0000 mg | DELAYED_RELEASE_TABLET | Freq: Every day | ORAL | Status: DC

## 2015-04-13 MED FILL — Heparin Sodium (Porcine) 2 Unit/ML in Sodium Chloride 0.9%: INTRAMUSCULAR | Qty: 1000 | Status: AC

## 2015-04-13 MED FILL — Lidocaine HCl Local Preservative Free (PF) Inj 1%: INTRAMUSCULAR | Qty: 30 | Status: AC

## 2015-04-13 NOTE — Progress Notes (Signed)
   SUBJECTIVE:  No chest pain  OBJECTIVE:   Vitals:   Filed Vitals:   04/12/15 1930 04/12/15 2014 04/13/15 0022 04/13/15 0437  BP:  170/44 118/35 124/33  Pulse: 71 72 68 67  Temp:  98 F (36.7 C) 98.4 F (36.9 C) 98.5 F (36.9 C)  TempSrc:  Oral Oral Oral  Resp: 23 15 23 19   Height:      Weight:   144 lb 10 oz (65.6 kg)   SpO2: 99% 100% 96% 98%   I&O's:   Intake/Output Summary (Last 24 hours) at 04/13/15 [id number] Last data filed at 04/13/15 0700  Gross per 24 hour  Intake  703.1 ml  Output   2600 ml  Net -1896.9 ml   TELEMETRY: Reviewed telemetry pt in NSR:     PHYSICAL EXAM General: Well developed, well nourished, in no acute distress Head:   Normal cephalic and atramatic  Lungs:   Clear bilaterally to auscultation. Heart:   HRRR S1 S2  No JVD.   Abdomen: abdomen soft and non-tender Msk:  Back normal,  Normal strength and tone for age. Extremities:   No edema.  2+ left radial pulse Neuro: Alert and oriented. Psych:  Normal affect, responds appropriately Skin: No rash   LABS: Basic Metabolic Panel:  Recent Labs  04/13/15 0250  NA 136  K 4.0  CL 101  CO2 26  GLUCOSE 116*  BUN 23*  CREATININE 1.62*  CALCIUM 8.7*   Liver Function Tests: No results for input(s): AST, ALT, ALKPHOS, BILITOT, PROT, ALBUMIN in the last 72 hours. No results for input(s): LIPASE, AMYLASE in the last 72 hours. CBC:  Recent Labs  04/13/15 0250  WBC 7.7  HGB 9.1*  HCT 27.4*  MCV 92.3  PLT 184   Cardiac Enzymes: No results for input(s): CKTOTAL, CKMB, CKMBINDEX, TROPONINI in the last 72 hours. BNP: Invalid input(s): POCBNP D-Dimer: No results for input(s): DDIMER in the last 72 hours. Hemoglobin A1C: No results for input(s): HGBA1C in the last 72 hours. Fasting Lipid Panel: No results for input(s): CHOL, HDL, LDLCALC, TRIG, CHOLHDL, LDLDIRECT in the last 72 hours. Thyroid Function Tests: No results for input(s): TSH, T4TOTAL, T3FREE, THYROIDAB in the last 72  hours.  Invalid input(s): FREET3 Anemia Panel: No results for input(s): VITAMINB12, FOLATE, FERRITIN, TIBC, IRON, RETICCTPCT in the last 72 hours. Coag Panel:   Lab Results  Component Value Date   INR 1.29 04/13/2015   INR 1.24 04/12/2015   INR 2.3* 04/05/2015    RADIOLOGY: No results found.    ASSESSMENT: Kathyrn Lass:    1) s/p Synergy DES to ostial RCA. Doing well. Sine she is on Coumadin, will discharge on Plavix only. No aspirin. Cardiac rehab to see. D/c if she does well with them.  2) mechanical prosthetic AVR. On IV heparin.  D/c IV heparin.  Resume home Coumadin regimen and schedule coumadin check on Wednesday afternoon.  If there is bleeding, she will contact us sooner.   3) anemia: likely dilutional due to IV fluids for  CRI. Left radial site minimally swollen. 2+ left radial pulse. Resume home dose of Lasix. Resume Diovan.  Check [facility] next Wednesday when she comes to the office.  GERD: needs Rx for protonix.  CRI: improved after fluid.   Jettie Booze, MD  04/13/2015  7:23 AM

## 2015-04-13 NOTE — Progress Notes (Signed)
ANTICOAGULATION CONSULT NOTE - Follow Up Consult  Pharmacy Consult for Heparin  Indication: AVR  Allergies  Allergen Reactions  . Darvon [Propoxyphene] Nausea And Vomiting  . Lisinopril Cough  . Septra [Sulfamethoxazole-Trimethoprim]     Increased INR  . Penicillins Rash    Patient Measurements: Height: 5' (152.4 cm) Weight: 144 lb 10 oz (65.6 kg) IBW/kg (Calculated) : 45.5  Vital Signs: Temp: 98.4 F (36.9 C) (07/22 0022) Temp Source: Oral (07/22 0022) BP: 118/35 mmHg (07/22 0022) Pulse Rate: 68 (07/22 0022)  Labs:  Recent Labs  04/12/15 0729 04/13/15 0250  HGB  --  9.1*  HCT  --  27.4*  PLT  --  184  LABPROT 15.8* 16.2*  INR 1.24 1.29  HEPARINUNFRC  --  <0.10*  CREATININE  --  1.62*    Estimated Creatinine Clearance: 23.8 mL/min (by C-G formula based on Cr of 1.62).  Assessment: Undetectable heparin level, infusing ok per RN, no cath site issues  Goal of Therapy:  Heparin level 0.3-0.7 units/ml Monitor platelets by anticoagulation protocol: Yes   Plan:  -Increase heparin to 900 units/hr -1230 HL -Daily CBC/HL -Monitor for bleeding  Narda Bonds 04/13/2015,4:25 AM

## 2015-04-13 NOTE — Progress Notes (Signed)
CARDIAC REHAB PHASE I   PRE:  Rate/Rhythm: 87 SR  BP:  Supine:   Sitting: 149/34  Standing:    Sao:   MODE:  Ambulation: 1000 ft   POST:  Rate/Rhythm: 102 ST  BP:  Supine:   Sitting: 165/64  Standing:    Sao:  [id number] Pt tolerated ambulation well without C/O of cp or SOB. VS stable. Pt to side of bed after walk with call light in reach. Completed stent discharge education with pt and husband. She voices understanding. Pt declines Out pt. CGRP due to completing it in the past.  Rodney Langton RN 04/13/2015 9:51 AM

## 2015-04-13 NOTE — Discharge Instructions (Signed)
PLEASE REMEMBER TO BRING ALL OF YOUR MEDICATIONS TO EACH OF YOUR FOLLOW-UP OFFICE VISITS. ° °PLEASE ATTEND ALL SCHEDULED FOLLOW-UP APPOINTMENTS.  ° °Activity: Increase activity slowly as tolerated. You may shower, but no soaking baths (or swimming) for 1 week. No driving for 2 days. No lifting over 5 lbs for 1 week. No sexual activity for 1 week.  ° °You May Return to Work: in 1 week (if applicable) ° °Wound Care: You may wash cath site gently with soap and water. Keep cath site clean and dry. If you notice pain, swelling, bleeding or pus at your cath site, please call [phone]. ° ° ° °Cardiac Cath Site Care °Refer to this sheet in the next few weeks. These instructions provide you with information on caring for yourself after your procedure. Your caregiver may also give you more specific instructions. Your treatment has been planned according to current medical practices, but problems sometimes occur. Call your caregiver if you have any problems or questions after your procedure. °HOME CARE INSTRUCTIONS °· You may shower 24 hours after the procedure. Remove the bandage (dressing) and gently wash the site with plain soap and water. Gently pat the site dry.  °· Do not apply powder or lotion to the site.  °· Do not sit in a bathtub, swimming pool, or whirlpool for 5 to 7 days.  °· No bending, squatting, or lifting anything over 10 pounds (4.5 kg) as directed by your caregiver.  °· Inspect the site at least twice daily.  °· Do not drive home if you are discharged the same day of the procedure. Have someone else drive you.  °· You may drive 24 hours after the procedure unless otherwise instructed by your caregiver.  °What to expect: °· Any bruising will usually fade within 1 to 2 weeks.  °· Blood that collects in the tissue (hematoma) may be painful to the touch. It should usually decrease in size and tenderness within 1 to 2 weeks.  °SEEK IMMEDIATE MEDICAL CARE IF: °· You have unusual pain at the site or down the  affected limb.  °· You have redness, warmth, swelling, or pain at the site.  °· You have drainage (other than a small amount of blood on the dressing).  °· You have chills.  °· You have a fever or persistent symptoms for more than 72 hours.  °· You have a fever and your symptoms suddenly get worse.  °· Your leg becomes pale, cool, tingly, or numb.  °· You have heavy bleeding from the site. Hold pressure on the site.  °Document Released: 10/11/2010 Document Revised: 08/28/2011 Document Reviewed: 10/11/2010 °[employer]® Patient Information ©2012 [employer]. ° °

## 2015-04-13 NOTE — Telephone Encounter (Signed)
TCM   Appt with Danna Hefty on 07/29 @ 2:30p per Suanne Marker

## 2015-04-13 NOTE — Discharge Summary (Signed)
CARDIOLOGY DISCHARGE SUMMARY   Patient ID: [name] MRN: [id number] DOB/AGE: Oct 16, 1932 81 y.o.  Admit date: 04/12/2015 Discharge date: 04/13/2015  PCP: Simona Huh, MD Primary Cardiologist: Dr. Beau Fanny  Primary Discharge Diagnosis:  Angina pectoris Secondary Discharge Diagnosis:  Chronic anticoagulation with Coumadin Mechanical prosthetic AVR Anemia History of bare-metal stents to the RCA and PL CK D stage III History of bypass surgery with LIMA-LAD, SVG-OM-PDA and SVG-D1  Procedures: Cardiac catheterization, coronary arteriogram, LIMA arteriogram, vein graft angiogram, DES to the RCA  Hospital Course: [name] is a 81 y.o. female with a history of mechanical AVR on Coumadin, CAD, hypertension, hyperlipidemia, diabetes, anemia and lower GI bleed when on anticoagulation and antiplatelets therapy. She was evaluated by Dr. Beau Fanny and cardiac catheterization was indicated. Her Coumadin was held and she was scheduled for the procedure. She came to the hospital for the catheterization on 04/12/2015.  Cardiac catheterization results are below. The SVG-OM was occluded but second limb of the graft to the PDA was patent and the PDA stent was patent so there was flow to the OM system. She had a 75% stenosis in the ostial RCA which was treated with a drug-eluting stent due to the higher risk of restenosis with a bare-metal stent.  Because of her renal insufficiency, no LV gram was done. She was hydrated with sodium bicarbonate fluids to minimize damage to her kidneys.   On 04/13/2015, she was seen by Dr. Beau Fanny and all data were reviewed. She was noted to be more anemic than previously and she was felt to have some blood loss from the procedure. There may also be a dilutional component because of the hydration although she is not short of breath. She has a Coumadin appointment and a provider visit next week and her CBC will be rechecked at that time.  Because of the need  for Plavix and her history of GI bleed, she will be on Coumadin and Plavix only with no aspirin.   Because of the Plavix, the omeprazole was changed to Protonix.   Because of her chronic kidney disease, she will hold her Lasix for 24 hours after the cath.  Consideration can be given to discontinuing or decreasing the isosorbide in the future, but she wishes to keep taking it for now.  She was seen by cardiac rehabilitation and ambulated well with them. No further inpatient workup was indicated and she is considered stable for discharge, to follow-up as an outpatient.  Labs:   Lab Results  Component Value Date   WBC 7.7 04/13/2015   HGB 9.1* 04/13/2015   HCT 27.4* 04/13/2015   MCV 92.3 04/13/2015   PLT 184 04/13/2015     Recent Labs Lab 04/13/15 0250  NA 136  K 4.0  CL 101  CO2 26  BUN 23*  CREATININE 1.62*  CALCIUM 8.7*  GLUCOSE 116*    Recent Labs  04/13/15 0250  INR 1.29   Cardiac Cath: 04/12/2015  Severe native three-vessel coronary artery disease. Patent LIMA to LAD. Patent SVG to diagonal. Patent native RCA which feeds collaterals to the circumflex system.  SVG to OM/PDA jump graft occluded proximally. The second half of this graft is open. Therefore, flow is going from the native RCA, and then retrograde into the vein graft which feeds the obtuse marginal.  Patent stents in the RCA and posterior lateral artery.  Culprit lesion for her symptoms was the new lesion in the ostium of the RCA. This was successfully  treated with a 3.5 x 16 Synergy drug-eluting stent, postdilated to greater than 4 mm in diameter.  Ost RCA lesion, 75% stenosed. There is a 0% residual stenosis post intervention. The lesion was not previously treated.  A SYNERGY DES 3.5X16 was placed. Continue aspirin and Plavix until her Coumadin is therapeutic. At that point, would stop the aspirin and use only clopidogrel and warfarin for her valve.  She has had bare-metal stents in the past due to  her history of GI bleeding. Due to the higher risk of restenosis at the ostium of the RCA, I was hesitant to place a bare-metal stent in this vessel. The Synergy drug-eluting stent has a bioabsorbable polymer. If clopidogrel needed to be stopped in the future before a year, this may be safer to do with this type of stent.  Resume warfarin tonight. Watch overnight. Hydrate with sodium bicarbonate.  EKG: 04/13/2015 Sinus rhythm, no acute ischemic changes  FOLLOW UP PLANS AND APPOINTMENTS Allergies  Allergen Reactions  . Darvon [Propoxyphene] Nausea And Vomiting  . Lisinopril Cough  . Septra [Sulfamethoxazole-Trimethoprim]     Increased INR  . Penicillins Rash     Medication List    STOP taking these medications        aspirin 81 MG tablet     omeprazole 20 MG capsule  Commonly known as:  PRILOSEC      TAKE these medications        atorvastatin 80 MG tablet  Commonly known as:  LIPITOR  TAKE ONE TABLET BY MOUTH ONCE DAILY     clopidogrel 75 MG tablet  Commonly known as:  PLAVIX  Take 1 tablet (75 mg total) by mouth daily with breakfast.     digoxin 0.125 MG tablet  Commonly known as:  LANOXIN  Take 0.125 mg by mouth daily.     folic acid A999333 MCG tablet  Commonly known as:  FOLVITE  Take 400 mcg by mouth every evening.     furosemide 80 MG tablet  Commonly known as:  LASIX  TAKE ONE TABLET BY MOUTH TWICE DAILY  Notes to Patient:  HOLD for 24 hours, restart on 04/14/2015.     glimepiride 4 MG tablet  Commonly known as:  AMARYL  Take 4 mg by mouth daily before breakfast.     INVOKANA 100 MG Tabs tablet  Generic drug:  canagliflozin  Take 100 mg by mouth daily.     isosorbide mononitrate 60 MG 24 hr tablet  Commonly known as:  IMDUR  TAKE ONE TABLET BY MOUTH ONCE DAILY     JANUVIA 50 MG tablet  Generic drug:  sitaGLIPtin  Take 50 mg by mouth daily.     metoprolol 50 MG tablet  Commonly known as:  LOPRESSOR  Take 50 mg by mouth 2 (two) times daily.      multivitamin with minerals Tabs tablet  Take 1 tablet by mouth daily.     nitroGLYCERIN 0.4 MG SL tablet  Commonly known as:  NITROSTAT  Place 1 tablet (0.4 mg total) under the tongue every 5 (five) minutes as needed for chest pain.     Omega-3 Fatty Acids 1200 MG Caps  Take 3 capsules by mouth 2 (two) times daily.     pantoprazole 40 MG tablet  Commonly known as:  PROTONIX  Take 1 tablet (40 mg total) by mouth daily at 6 (six) AM.     potassium chloride 10 MEQ tablet  Commonly known as:  K-DUR,KLOR-CON  Take 20  mEq by mouth 2 (two) times daily.     TANDEM PLUS PO  Take 1 tablet by mouth 2 (two) times daily.     valsartan 80 MG tablet  Commonly known as:  DIOVAN  Take 80 mg by mouth daily.     warfarin 5 MG tablet  Commonly known as:  COUMADIN  Take 2.5-5 mg by mouth daily. Take 5 mg every day except on Saturday take half of the 5mg  tablet     ZETIA 10 MG tablet  Generic drug:  ezetimibe  TAKE ONE TABLET BY MOUTH ONCE DAILY        Discharge Instructions    Diet - low sodium heart healthy    Complete by:  As directed      Diet Carb Modified    Complete by:  As directed      Increase activity slowly    Complete by:  As directed           Follow-up Information    Follow up with Murray Hodgkins, NP On 04/20/2015.   Specialties:  Nurse Practitioner, Cardiology, Radiology   Why:  See provider at 2:30 PM, please arrive 15 minutes early for paperwork.   Contact information:   [id number] [address] [phone]       Follow up with [facility] On 04/20/2015.   Why:  Coumadin check at 3:15 PM.   Contact information:   [address] [place] [phone]       BRING ALL MEDICATIONS WITH YOU TO FOLLOW UP APPOINTMENTS  Time spent with patient to include physician time: 44 min Signed: Rosaria Ferries, PA-C 04/13/2015, 9:25 AM Co-Sign MD  I have examined the patient and reviewed assessment and plan and  discussed with patient.  Agree with above as stated.  Doing well post cath.  WIl need INR checked. Follow renal insufficiency and anemia.  Please see my progress note from today.  Narjis Mira S.

## 2015-04-13 NOTE — Telephone Encounter (Signed)
Patient did not have visible blood in stool.  She takes iron and has chronically dark stools.  Hbg has been in the 10-10.5 range chronically.  Proceeded with cath.

## 2015-04-15 ENCOUNTER — Emergency Department (HOSPITAL_COMMUNITY): Payer: Medicare Other

## 2015-04-15 ENCOUNTER — Inpatient Hospital Stay (HOSPITAL_COMMUNITY)
Admission: EM | Admit: 2015-04-15 | Discharge: 2015-04-25 | DRG: 247 | Disposition: A | Discharge status: Home / Self Care | Payer: Medicare Other | Attending: Internal Medicine | Admitting: Internal Medicine

## 2015-04-15 ENCOUNTER — Telehealth: Payer: Self-pay | Admitting: Physician Assistant

## 2015-04-15 ENCOUNTER — Encounter (HOSPITAL_COMMUNITY): Payer: Self-pay | Admitting: Emergency Medicine

## 2015-04-15 DIAGNOSIS — D62 Acute posthemorrhagic anemia: Secondary | ICD-10-CM | POA: Diagnosis not present

## 2015-04-15 DIAGNOSIS — I2510 Atherosclerotic heart disease of native coronary artery without angina pectoris: Secondary | ICD-10-CM | POA: Diagnosis present

## 2015-04-15 DIAGNOSIS — I4891 Unspecified atrial fibrillation: Secondary | ICD-10-CM | POA: Diagnosis not present

## 2015-04-15 DIAGNOSIS — I252 Old myocardial infarction: Secondary | ICD-10-CM | POA: Diagnosis not present

## 2015-04-15 DIAGNOSIS — E119 Type 2 diabetes mellitus without complications: Secondary | ICD-10-CM

## 2015-04-15 DIAGNOSIS — K648 Other hemorrhoids: Secondary | ICD-10-CM | POA: Insufficient documentation

## 2015-04-15 DIAGNOSIS — E785 Hyperlipidemia, unspecified: Secondary | ICD-10-CM | POA: Diagnosis present

## 2015-04-15 DIAGNOSIS — R531 Weakness: Secondary | ICD-10-CM | POA: Diagnosis not present

## 2015-04-15 DIAGNOSIS — I25719 Atherosclerosis of autologous vein coronary artery bypass graft(s) with unspecified angina pectoris: Secondary | ICD-10-CM | POA: Diagnosis present

## 2015-04-15 DIAGNOSIS — K921 Melena: Secondary | ICD-10-CM | POA: Diagnosis not present

## 2015-04-15 DIAGNOSIS — Z7901 Long term (current) use of anticoagulants: Secondary | ICD-10-CM | POA: Insufficient documentation

## 2015-04-15 DIAGNOSIS — K644 Residual hemorrhoidal skin tags: Secondary | ICD-10-CM | POA: Diagnosis present

## 2015-04-15 DIAGNOSIS — I25119 Atherosclerotic heart disease of native coronary artery with unspecified angina pectoris: Secondary | ICD-10-CM | POA: Diagnosis present

## 2015-04-15 DIAGNOSIS — I480 Paroxysmal atrial fibrillation: Secondary | ICD-10-CM | POA: Diagnosis present

## 2015-04-15 DIAGNOSIS — M1990 Unspecified osteoarthritis, unspecified site: Secondary | ICD-10-CM | POA: Diagnosis present

## 2015-04-15 DIAGNOSIS — R079 Chest pain, unspecified: Secondary | ICD-10-CM | POA: Diagnosis not present

## 2015-04-15 DIAGNOSIS — E872 Acidosis, unspecified: Secondary | ICD-10-CM

## 2015-04-15 DIAGNOSIS — N183 Chronic kidney disease, stage 3 (moderate): Secondary | ICD-10-CM

## 2015-04-15 DIAGNOSIS — K5520 Angiodysplasia of colon without hemorrhage: Secondary | ICD-10-CM | POA: Diagnosis present

## 2015-04-15 DIAGNOSIS — E1122 Type 2 diabetes mellitus with diabetic chronic kidney disease: Secondary | ICD-10-CM | POA: Diagnosis present

## 2015-04-15 DIAGNOSIS — Z952 Presence of prosthetic heart valve: Secondary | ICD-10-CM | POA: Diagnosis not present

## 2015-04-15 DIAGNOSIS — I248 Other forms of acute ischemic heart disease: Secondary | ICD-10-CM | POA: Diagnosis present

## 2015-04-15 DIAGNOSIS — K558 Other vascular disorders of intestine: Secondary | ICD-10-CM | POA: Diagnosis not present

## 2015-04-15 DIAGNOSIS — K922 Gastrointestinal hemorrhage, unspecified: Secondary | ICD-10-CM | POA: Diagnosis present

## 2015-04-15 DIAGNOSIS — K219 Gastro-esophageal reflux disease without esophagitis: Secondary | ICD-10-CM | POA: Diagnosis present

## 2015-04-15 DIAGNOSIS — Z7902 Long term (current) use of antithrombotics/antiplatelets: Secondary | ICD-10-CM | POA: Diagnosis not present

## 2015-04-15 DIAGNOSIS — K640 First degree hemorrhoids: Secondary | ICD-10-CM | POA: Diagnosis present

## 2015-04-15 DIAGNOSIS — I129 Hypertensive chronic kidney disease with stage 1 through stage 4 chronic kidney disease, or unspecified chronic kidney disease: Secondary | ICD-10-CM | POA: Diagnosis present

## 2015-04-15 DIAGNOSIS — Z955 Presence of coronary angioplasty implant and graft: Secondary | ICD-10-CM | POA: Diagnosis not present

## 2015-04-15 DIAGNOSIS — N184 Chronic kidney disease, stage 4 (severe): Secondary | ICD-10-CM | POA: Diagnosis present

## 2015-04-15 DIAGNOSIS — Q2733 Arteriovenous malformation of digestive system vessel: Secondary | ICD-10-CM

## 2015-04-15 DIAGNOSIS — I10 Essential (primary) hypertension: Secondary | ICD-10-CM | POA: Diagnosis not present

## 2015-04-15 DIAGNOSIS — Z951 Presence of aortocoronary bypass graft: Secondary | ICD-10-CM | POA: Diagnosis not present

## 2015-04-15 DIAGNOSIS — E1129 Type 2 diabetes mellitus with other diabetic kidney complication: Secondary | ICD-10-CM

## 2015-04-15 DIAGNOSIS — Z954 Presence of other heart-valve replacement: Secondary | ICD-10-CM | POA: Diagnosis not present

## 2015-04-15 DIAGNOSIS — R778 Other specified abnormalities of plasma proteins: Secondary | ICD-10-CM | POA: Diagnosis present

## 2015-04-15 DIAGNOSIS — R0789 Other chest pain: Secondary | ICD-10-CM | POA: Diagnosis not present

## 2015-04-15 DIAGNOSIS — R7989 Other specified abnormal findings of blood chemistry: Secondary | ICD-10-CM | POA: Diagnosis present

## 2015-04-15 DIAGNOSIS — N179 Acute kidney failure, unspecified: Secondary | ICD-10-CM | POA: Insufficient documentation

## 2015-04-15 DIAGNOSIS — I482 Chronic atrial fibrillation: Secondary | ICD-10-CM | POA: Diagnosis not present

## 2015-04-15 LAB — COMPREHENSIVE METABOLIC PANEL
ALBUMIN: 3.7 g/dL (ref 3.5–5.0)
ALT: 20 U/L (ref 14–54)
ANION GAP: 9 (ref 5–15)
AST: 30 U/L (ref 15–41)
Alkaline Phosphatase: 41 U/L (ref 38–126)
BUN: 35 mg/dL — ABNORMAL HIGH (ref 6–20)
CALCIUM: 9.3 mg/dL (ref 8.9–10.3)
CHLORIDE: 103 mmol/L (ref 101–111)
CO2: 25 mmol/L (ref 22–32)
CREATININE: 2.1 mg/dL — AB (ref 0.44–1.00)
GFR calc Af Amer: 25 mL/min — ABNORMAL LOW (ref >60)
GFR calc non Af Amer: 21 mL/min — ABNORMAL LOW (ref >60)
Glucose, Bld: 188 mg/dL — ABNORMAL HIGH (ref 65–99)
Potassium: 4.3 mmol/L (ref 3.5–5.1)
Sodium: 137 mmol/L (ref 135–145)
Total Bilirubin: 1.1 mg/dL (ref 0.3–1.2)
Total Protein: 7.6 g/dL (ref 6.5–8.1)

## 2015-04-15 LAB — I-STAT TROPONIN, ED
TROPONIN I, POC: 0.47 ng/mL — AB (ref 0.00–0.08)
Troponin i, poc: 0.54 ng/mL (ref 0.00–0.08)

## 2015-04-15 LAB — CBC WITH DIFFERENTIAL/PLATELET
BASOS ABS: 0 10*3/uL (ref 0.0–0.1)
Basophils Relative: 0 % (ref 0–1)
Eosinophils Absolute: 0.1 10*3/uL (ref 0.0–0.7)
Eosinophils Relative: 2 % (ref 0–5)
HEMATOCRIT: 29.7 % — AB (ref 36.0–46.0)
HEMOGLOBIN: 9.9 g/dL — AB (ref 12.0–15.0)
Lymphocytes Relative: 19 % (ref 12–46)
Lymphs Abs: 1.6 10*3/uL (ref 0.7–4.0)
MCH: 31.2 pg (ref 26.0–34.0)
MCHC: 33.3 g/dL (ref 30.0–36.0)
MCV: 93.7 fL (ref 78.0–100.0)
MONO ABS: 0.7 10*3/uL (ref 0.1–1.0)
Monocytes Relative: 8 % (ref 3–12)
NEUTROS ABS: 5.8 10*3/uL (ref 1.7–7.7)
Neutrophils Relative %: 71 % (ref 43–77)
Platelets: 232 10*3/uL (ref 150–400)
RBC: 3.17 MIL/uL — ABNORMAL LOW (ref 3.87–5.11)
RDW: 14.9 % (ref 11.5–15.5)
WBC: 8.3 10*3/uL (ref 4.0–10.5)

## 2015-04-15 LAB — I-STAT CG4 LACTIC ACID, ED
LACTIC ACID, VENOUS: 2.18 mmol/L — AB (ref 0.5–2.0)
Lactic Acid, Venous: 1.78 mmol/L (ref 0.5–2.0)

## 2015-04-15 LAB — TROPONIN I: Troponin I: 0.55 ng/mL (ref <0.031)

## 2015-04-15 LAB — PROTIME-INR
INR: 1.37 (ref 0.00–1.49)
Prothrombin Time: 17 seconds — ABNORMAL HIGH (ref 11.6–15.2)

## 2015-04-15 LAB — GLUCOSE, CAPILLARY
Glucose-Capillary: 130 mg/dL — ABNORMAL HIGH (ref 65–99)
Glucose-Capillary: 69 mg/dL (ref 65–99)

## 2015-04-15 LAB — MRSA PCR SCREENING: MRSA by PCR: POSITIVE — AB

## 2015-04-15 LAB — POC OCCULT BLOOD, ED: FECAL OCCULT BLD: POSITIVE — AB

## 2015-04-15 LAB — CBG MONITORING, ED: Glucose-Capillary: 98 mg/dL (ref 65–99)

## 2015-04-15 LAB — PREPARE RBC (CROSSMATCH)

## 2015-04-15 LAB — LIPASE, BLOOD: Lipase: 35 U/L (ref 22–51)

## 2015-04-15 MED ORDER — SODIUM CHLORIDE 0.9 % IV BOLUS (SEPSIS)
1000.0000 mL | Freq: Once | INTRAVENOUS | Status: AC
  Administered 2015-04-15: 1000 mL via INTRAVENOUS

## 2015-04-15 MED ORDER — ONDANSETRON HCL 4 MG/2ML IJ SOLN: 4.0000 mg | Freq: Four times a day (QID) | INTRAMUSCULAR | Status: DC | PRN

## 2015-04-15 MED ORDER — ONDANSETRON HCL 4 MG PO TABS: 4.0000 mg | ORAL_TABLET | Freq: Four times a day (QID) | ORAL | Status: DC | PRN

## 2015-04-15 MED ORDER — SODIUM CHLORIDE 0.9 % IJ SOLN
3.0000 mL | Freq: Two times a day (BID) | INTRAMUSCULAR | Status: DC
  Administered 2015-04-15 – 2015-04-22 (×13): 3 mL via INTRAVENOUS

## 2015-04-15 MED ORDER — PANTOPRAZOLE SODIUM 40 MG IV SOLR
40.0000 mg | Freq: Once | INTRAVENOUS | Status: AC
  Administered 2015-04-15: 40 mg via INTRAVENOUS
  Filled 2015-04-15: qty 40

## 2015-04-15 MED ORDER — INSULIN ASPART 100 UNIT/ML ~~LOC~~ SOLN
0.0000 [IU] | SUBCUTANEOUS | Status: DC
  Administered 2015-04-15 – 2015-04-16 (×2): 1 [IU] via SUBCUTANEOUS
  Administered 2015-04-16: 2 [IU] via SUBCUTANEOUS

## 2015-04-15 MED ORDER — SODIUM CHLORIDE 0.9 % IV SOLN
INTRAVENOUS | Status: DC
  Administered 2015-04-15: 15:00:00 via INTRAVENOUS
  Administered 2015-04-16: 50 mL/h via INTRAVENOUS
  Administered 2015-04-16: 01:00:00 via INTRAVENOUS

## 2015-04-15 MED ORDER — CHLORHEXIDINE GLUCONATE CLOTH 2 % EX PADS
6.0000 | MEDICATED_PAD | Freq: Every day | CUTANEOUS | Status: AC
  Administered 2015-04-16 – 2015-04-20 (×5): 6 via TOPICAL

## 2015-04-15 MED ORDER — MUPIROCIN 2 % EX OINT
1.0000 "application " | TOPICAL_OINTMENT | Freq: Two times a day (BID) | CUTANEOUS | Status: AC
  Administered 2015-04-15 – 2015-04-20 (×10): 1 via NASAL
  Filled 2015-04-15 (×3): qty 22

## 2015-04-15 MED ORDER — PANTOPRAZOLE SODIUM 40 MG PO TBEC
40.0000 mg | DELAYED_RELEASE_TABLET | Freq: Every day | ORAL | Status: DC
  Administered 2015-04-16 – 2015-04-25 (×9): 40 mg via ORAL
  Filled 2015-04-15 (×9): qty 1

## 2015-04-15 MED ORDER — DIGOXIN 125 MCG PO TABS
0.1250 mg | ORAL_TABLET | Freq: Every day | ORAL | Status: DC
  Administered 2015-04-16 – 2015-04-25 (×10): 0.125 mg via ORAL
  Filled 2015-04-15 (×10): qty 1

## 2015-04-15 MED ORDER — METOPROLOL TARTRATE 12.5 MG HALF TABLET
12.5000 mg | ORAL_TABLET | Freq: Two times a day (BID) | ORAL | Status: DC
  Administered 2015-04-15 – 2015-04-25 (×20): 12.5 mg via ORAL
  Filled 2015-04-15 (×21): qty 1

## 2015-04-15 MED ORDER — SODIUM CHLORIDE 0.9 % IV SOLN
Freq: Once | INTRAVENOUS | Status: AC
  Administered 2015-04-15: 15:00:00 via INTRAVENOUS

## 2015-04-15 NOTE — Consult Note (Signed)
CARDIOLOGY CONSULT NOTE   Patient ID: [name] MRN: [id number] DOB/AGE: 1932/10/28 81 y.o.  Admit date: 04/15/2015  Primary Physician   Simona Huh, MD Primary Cardiologist   Dr. Beau Fanny Reason for Consultation   GI bleed (she was discharged on plavix and coumadin 7/22)  HPI: [name] is a 81 y.o. female with a history of diabetes, hypertension, dyslipidemia, atrial fibrillation and mechanical aortic valve replacement on warfarin, recurrent GI bleeding and setting of AVMs, known CAD with previous bare metal stents and recently discharged 7/22 after undergoing placement of DES to the RCA 7/21. She was started on Plavix who presented to the ER 7/24 with one episode of bright red blood per rectum with streaking of blood on the stool.  The bleeding was associated with chest tightness and shortness of breath. At baseline patient has dark stools because of iron replacement. 2 weeks before her stent (recent admission) she was having shortness of breath and chest pain which is typical symptoms she experiences with past GI bleeding/anemia. Patient reports that since the stent has been placed she has not noticed any change in his shortness of breath or chest pain. She reports that typically when she does have this type of bleeding that she will receive 1-2 units of blood which will improve her chest pain and shortness of breath.   Today after having a bowel movement she noted large volume blood streaking on her stool, no abdominal pain, no pain with passage of bowel movement. She reports her last colonoscopy and EGD was about 4-5 years ago including the capsule endoscopy. She's had no other symptoms.  In the ER her blood pressure has been soft ranging from 118/56-93/42 with an MAP of 55 at its lowest. Otherwise vital signs were stable. She is not tachycardic but she is also on AV nodal blocking agents. She is afebrile. Her room-air saturations are 98%. Laboratory data unremarkable except  for increased azotemia with a BUN of 35 and creatinine 2.10 consistent with mild acute renal failure. Troponin elevated at 0.55 with repeat troponin 0.54 and 0.47. Initial lactic acid was elevated at 2.183 by mouth lactic acid 1.78. Hemoglobin stable at 9.9 which is consistent with her baseline. Current INR subtherapeutic at 1.37 with a PT of 17. Glucose 188. Equal occult blood was positive. Chest x-ray was unremarkable. EKG revealed atrial fibrillation with rate control without any specific ST or T-wave changes that would be concerning for acute ischemia.   Past Medical History  Diagnosis Date  . Hypertension   . Hyperlipemia   . CHF (congestive heart failure)   . Arteriovenous malformation of gastrointestinal tract   . Heart murmur   . Anginal pain   . Type II diabetes mellitus   . Anemia   . History of blood transfusion     "a few times over the years; usually related to my Coumadin" (04/12/2015  . Chronic lower GI bleeding     "today; last time was ~ 8 yr ago; used to have them often before that too" (02/11/2013)  . Macula lutea degeneration   . Coronary artery disease   . Old MI (myocardial infarction)     "a coulple /dr in 02/2008; I never even knew I'd had them" (04/12/2015)  . Arthritis     "fingers mostly" (04/12/2015)  . DJD (degenerative joint disease)   . History of gout   . Chronic kidney disease (CKD), stage III (moderate)      Past Surgical History  Procedure  Laterality Date  . Appendectomy  1953  . Aortic valve replacement  ~ 1993  . Coronary artery bypass graft  1990    LIMA-LAD, SVG-OM-PDA, SVG-D1  . Cardiac valve replacement  1994    St. Jude/notes 10/29/2003 (02/11/2013)  . Exploratory laparotomy  02/24/2008    which revealed a retroperitoneal hematoma and bleeding from the right external iliac artery/notes 03/02/2008 (02/11/2013)   . Dilation and curettage of uterus  1960's    'after a miscarriage" (02/11/2013)  . Enteroscopy N/A 02/13/2013    Procedure: ENTEROSCOPY;   Surgeon: Lafayette Dragon, MD;  Location: [facility] ENDOSCOPY;  Service: Endoscopy;  Laterality: N/A;  . Colonoscopy N/A 02/13/2013    Procedure: COLONOSCOPY;  Surgeon: Lafayette Dragon, MD;  Location: [facility] ENDOSCOPY;  Service: Endoscopy;  Laterality: N/A;  . Cardiac catheterization  ~1990  . Coronary angioplasty with stent placement  2009+    "3 at least; put in 1 stent at a time" (02/11/2013)  . Cardiac catheterization  04/12/2015    Procedure: Coronary Stent Intervention;  Surgeon: Jettie Booze, MD;    SYNERGY DES 3.5X16 to the ostial RCA   . Cardiac catheterization  04/12/2015    Procedure: Coronary/Graft Angiography;  Surgeon: Eloy End, MD; LAD & CFX 100%, patent LIMA-LAD, SVG-D1; SVG-OM-PDA first limb 100%, 2nd limb patent; oRCA 75%>0 w/ stent    Allergies  Allergen Reactions  . Darvon [Propoxyphene] Nausea And Vomiting  . Lisinopril Cough  . Septra [Sulfamethoxazole-Trimethoprim]     Increased INR  . Warfarin And Related Other (See Comments)    Generic Warfarin -- pt states it makes her "crazy"  ONLY TOLERATES BRAND  . Penicillins Rash    I have reviewed the patient's current medications . [START ON 04/16/2015] digoxin  0.125 mg Oral Daily  . insulin aspart  0-9 Units Subcutaneous 6 times per day  . metoprolol tartrate  12.5 mg Oral BID  . [START ON 04/16/2015] pantoprazole  40 mg Oral Q0600  . sodium chloride  3 mL Intravenous Q12H   . sodium chloride 100 mL/hr at 04/15/15 1508   ondansetron **OR** ondansetron (ZOFRAN) IV  Prior to Admission medications   Medication Sig Start Date End Date Taking? Authorizing Provider  atorvastatin (LIPITOR) 80 MG tablet TAKE ONE TABLET BY MOUTH ONCE DAILY 02/12/15  Yes Jettie Booze, MD  Canagliflozin (INVOKANA) 100 MG TABS Take 100 mg by mouth daily.    Yes Historical Provider, MD  clopidogrel (PLAVIX) 75 MG tablet Take 1 tablet (75 mg total) by mouth daily with breakfast. 04/13/15  Yes Rhonda G Barrett, PA-C  digoxin (LANOXIN) 0.125  MG tablet Take 0.125 mg by mouth daily.   Yes Historical Provider, MD  FeFum-FePo-FA-B Cmp-C-Zn-Mn-Cu (TANDEM PLUS PO) Take 1 tablet by mouth 2 (two) times daily.   Yes Historical Provider, MD  folic acid (FOLVITE) A999333 MCG tablet Take 400 mcg by mouth every evening.    Yes Historical Provider, MD  furosemide (LASIX) 80 MG tablet TAKE ONE TABLET BY MOUTH TWICE DAILY 11/13/14  Yes Jettie Booze, MD  glimepiride (AMARYL) 4 MG tablet Take 4 mg by mouth daily before breakfast. 03/24/15  Yes Historical Provider, MD  isosorbide mononitrate (IMDUR) 60 MG 24 hr tablet TAKE ONE TABLET BY MOUTH ONCE DAILY 11/13/14  Yes Jettie Booze, MD  JANUVIA 50 MG tablet Take 50 mg by mouth daily.  11/26/13  Yes Historical Provider, MD  metoprolol (LOPRESSOR) 50 MG tablet Take 50 mg by mouth 2 (two)  times daily.   Yes Historical Provider, MD  Multiple Vitamin (MULTIVITAMIN WITH MINERALS) TABS Take 1 tablet by mouth daily.   Yes Historical Provider, MD  nitroGLYCERIN (NITROSTAT) 0.4 MG SL tablet Place 1 tablet (0.4 mg total) under the tongue every 5 (five) minutes as needed for chest pain. 04/06/15  Yes Jettie Booze, MD  Omega-3 Fatty Acids 1200 MG CAPS Take 3 capsules by mouth 2 (two) times daily.   Yes Historical Provider, MD  pantoprazole (PROTONIX) 40 MG tablet Take 1 tablet (40 mg total) by mouth daily at 6 (six) AM. 04/13/15  Yes Rhonda G Barrett, PA-C  potassium chloride (K-DUR,KLOR-CON) 10 MEQ tablet Take 20 mEq by mouth 2 (two) times daily.    Yes Historical Provider, MD  valsartan (DIOVAN) 80 MG tablet Take 80 mg by mouth daily.   Yes Historical Provider, MD  warfarin (COUMADIN) 5 MG tablet Take 2.5-5 mg by mouth daily. Take 5 mg every day except on Saturday take 2.5 mg   Yes Historical Provider, MD  ZETIA 10 MG tablet TAKE ONE TABLET BY MOUTH ONCE DAILY 03/27/15  Yes Jettie Booze, MD     History   Social History  . Marital Status: Married    Spouse Name: N/A  . Number of Children: N/A  .  Years of Education: N/A   Occupational History  . Not on file.   Social History Main Topics  . Smoking status: Never Smoker   . Smokeless tobacco: Never Used  . Alcohol Use: No  . Drug Use: No  . Sexual Activity: Yes   Other Topics Concern  . Not on file   Social History Narrative    Family Status  Relation Status Death Age  . Mother Deceased   . Father Deceased    Family History  Problem Relation Age of Onset  . Heart disease Mother   . Heart disease Father      ROS:  Full 14 point review of systems complete and found to be negative unless listed above. Patient denies fever, chills, headache, sweats, rash, change in vision, change in hearing, cough, urinary symptoms. All other systems are reviewed and are negative.  Physical Exam: Blood pressure 138/54, pulse 68, temperature 98.2 F (36.8 C), temperature source Oral, resp. rate 16, height 5' (1.524 m), weight 137 lb 9.1 oz (62.4 kg), SpO2 100 %.  Patient is oriented to person time and place. Affect is normal. Her husband is in the room. Head is atraumatic. Sclera and conjunctiva are normal. There is no jugulovenous distention. Lungs are clear. Respiratory effort is nonlabored. Cardiac exam reveals S1 and S2. The abdomen is soft. There is no peripheral edema. There are no musculoskeletal deformities. There are no skin rashes.  Labs:   Lab Results  Component Value Date   WBC 8.3 04/15/2015   HGB 9.9* 04/15/2015   HCT 29.7* 04/15/2015   MCV 93.7 04/15/2015   PLT 232 04/15/2015    Recent Labs  04/15/15 1145  INR 1.37    Recent Labs Lab 04/15/15 1145  NA 137  K 4.3  CL 103  CO2 25  BUN 35*  CREATININE 2.10*  CALCIUM 9.3  PROT 7.6  BILITOT 1.1  ALKPHOS 41  ALT 20  AST 30  GLUCOSE 188*  ALBUMIN 3.7   MAGNESIUM  Date Value Ref Range Status  04/24/2008 2.5  Final    Recent Labs  04/15/15 1145  TROPONINI 0.55*    Recent Labs  04/15/15 1203 04/15/15  1421  TROPIPOC 0.54* 0.47*   Echo:  04/10/2015 LV EF: 60% -  65%  ------------------------------------------------------------------- Indications:   (R06.02).  ------------------------------------------------------------------- History:  PMH: AVR. Acquired from the patient and from the patient&'s chart. Dyspnea. Congestive heart failure. PMH: Myocardial infarction. Risk factors: Hypertension. Dyslipidemia.  ------------------------------------------------------------------- Study Conclusions  - Left ventricle: The cavity size was normal. Wall thickness was increased in a pattern of mild LVH. Systolic function was normal. The estimated ejection fraction was in the range of 60% to 65%. - Aortic valve: AV prosthesis appears to move well Peak and mean gradients through the valve are 57 and 28 mm Hg respectively. There was mild regurgitation. - Mitral valve: Calcified annulus. Mildly thickened leaflets . There was mild regurgitation. - Left atrium: The atrium was mildly dilated.   ECG:  Afib at  Vent. rate 79 BPM PR interval * ms QRS duration 126 ms QT/QTc 386/442 ms P-R-T axes * -57 90  Radiology:  Dg Chest Portable 1 View  04/15/2015   CLINICAL DATA:  81 year old presenting with melena and bright red blood per rectum. Coronary stent placed 1 week ago, and patient placed on anticoagulation with Coumadin and Plavix. Patient complains of generalized weakness and chest pressure worse with exertion. Prior CABG and aortic valve replacement. Prior episodes of GI bleeding related to small bowel AVMs.  EXAM: PORTABLE CHEST - 1 VIEW  COMPARISON:  08/09/2010 and earlier.  FINDINGS: Sternotomy for CABG and aortic valve replacement. Cardiac silhouette upper normal in size for AP portable technique, unchanged. Lungs clear. Bronchovascular markings normal. Pulmonary vascularity normal. No visible pleural effusions. No pneumothorax.  IMPRESSION: Stable borderline heart size.  No acute cardiopulmonary disease.    Electronically Signed   By: Evangeline Dakin M.D.   On: 04/15/2015 12:11    ASSESSMENT AND PLAN:       Acute lower GI bleeding/known history AVMs     Coumadin will be held and Plavix will be held. We will have to decide each day what medicines we can use. This is a very difficult situation with a mechanical aortic valve and recent coronary intervention with a stent. It is my understanding that this was a drug-eluting stent.    Chronic anticoagulation      For, today, Coumadin will have to be held.    Acute blood loss anemia   S/P AVR (aortic valve replacement)   CAD -recent DES to RCA 04/12/15   DM (diabetes mellitus)   HTN (hypertension)   Hyperlipidemia   Elevated troponin   Acute renal failure superimposed on stage 3 chronic kidney disease   Chronic atrial fibrillation-CHADVASC = 6   GI bleed   Signed: Bhagat,Bhavinkumar, PA 04/15/2015, 4:31 PM Pager [phone] Patient seen and examined. I agree with the assessment and plan as detailed above. See also my additional thoughts below.   I have included my additions to the assessment and plan. This is a difficult situation. Coumadin and Plavix must be held today. We will have to decide which agents we can restart and when. Historically the patient has stabilized rapidly with these bleeds. Hopefully she will stabilize rapidly again. She is receiving blood at this time. She is hemodynamically stable.  Dola Argyle, MD, [facility] 04/15/2015 5:02 PM

## 2015-04-15 NOTE — H&P (Signed)
[facility] History and Physical                                                                                    [name], is a 81 y.o. female  MRN: [id number]   DOB - [birth year]  Admit Date - 04/15/2015  Outpatient Primary MD for the patient is Simona Huh, MD  Referring MD: Billy Fischer / ER  Consulting M.D: Acie Fredrickson / Cardiology and Olevia Perches / Gastroenterology  With History of -  Past Medical History  Diagnosis Date  . Hypertension   . Hyperlipemia   . CHF (congestive heart failure)   . Arteriovenous malformation of gastrointestinal tract   . Heart murmur   . Anginal pain   . Type II diabetes mellitus   . Anemia   . History of blood transfusion     "a few times over the years; usually related to my Coumadin" (04/12/2015  . Chronic lower GI bleeding     "today; last time was ~ 8 yr ago; used to have them often before that too" (02/11/2013)  . Macula lutea degeneration   . Coronary artery disease   . Old MI (myocardial infarction)     "a coulple /dr in 02/2008; I never even knew I'd had them" (04/12/2015)  . Arthritis     "fingers mostly" (04/12/2015)  . DJD (degenerative joint disease)   . History of gout   . Chronic kidney disease (CKD), stage III (moderate)       Past Surgical History  Procedure Laterality Date  . Appendectomy  1953  . Aortic valve replacement  ~ 1993  . Coronary artery bypass graft  1990    LIMA-LAD, SVG-OM-PDA, SVG-D1  . Cardiac valve replacement  1994    St. Jude/notes 10/29/2003 (02/11/2013)  . Exploratory laparotomy  02/24/2008    which revealed a retroperitoneal hematoma and bleeding from the right external iliac artery/notes 03/02/2008 (02/11/2013)   . Dilation and curettage of uterus  1960's    'after a miscarriage" (02/11/2013)  . Enteroscopy N/A 02/13/2013    Procedure: ENTEROSCOPY;  Surgeon: Lafayette Dragon, MD;  Location: [facility] ENDOSCOPY;  Service: Endoscopy;  Laterality: N/A;  . Colonoscopy N/A 02/13/2013    Procedure: COLONOSCOPY;   Surgeon: Lafayette Dragon, MD;  Location: [facility] ENDOSCOPY;  Service: Endoscopy;  Laterality: N/A;  . Cardiac catheterization  ~1990  . Coronary angioplasty with stent placement  2009+    "3 at least; put in 1 stent at a time" (02/11/2013)  . Cardiac catheterization  04/12/2015    Procedure: Coronary Stent Intervention;  Surgeon: Jettie Booze, MD;    SYNERGY DES 3.5X16 to the ostial RCA   . Cardiac catheterization  04/12/2015    Procedure: Coronary/Graft Angiography;  Surgeon: Eloy End, MD; LAD & CFX 100%, patent LIMA-LAD, SVG-D1; SVG-OM-PDA first limb 100%, 2nd limb patent; oRCA 75%>0 w/ stent    in for   Chief Complaint  Patient presents with  . Rectal Bleeding     HPI This is a 81 year old female patient with known history diabetes, hypertension, dyslipidemia, atrial fibrillation and mechanical aortic valve replacement on warfarin, recurrent GI bleeding and setting of  AVMs, known CAD with previous bare metal stents and recently his charge 7/22 after undergoing placement of DES to the RCA 7/21. She was started on Plavix. Patient presented to the ER today with one episode of bright red blood per rectum with streaking of blood on the stool. Discussed typical for how her GI bleed symptoms usually begin. The bleeding was associated with chest tightness and shortness of breath. At baseline patient has dark stools because of iron replacement. She clarifies that for 2 weeks before her stent she was having shortness of breath and chest pain which is typical for  symptoms she experiences with past GI bleeding/anemia but opted to see her cardiologist first to ensure this was not related to her heart. Subsequently she underwent stent placement as described above. Patient reports that since the stent has been placed she has not noticed any change in his shortness of breath or chest pain. She reports that typically when she does have this type of bleeding that she will receive 1-2 units of blood which  will improve her chest pain and shortness of breath. Today after having a bowel movement she noted large volume blood streaking on her stool, no abdominal pain, no pain with passage of bowel movement. She reports her last colonoscopy and EGD was about 4-5 years ago including the capsule endoscopy. She's had no other symptoms.  In the ER her blood pressure has been soft ranging from 118/56-93/42 with an MAP of 55 at its lowest. Otherwise vital signs were stable. She is not tachycardic but she is also on AV nodal blocking agents. She is afebrile. Her room-air saturations are 98%. Laboratory data unremarkable except for increased azotemia with a BUN of 35 and creatinine 2.10 consistent with mild acute renal failure. Troponin elevated at 0.55 with repeat troponin 0.54 and 0.47. Initial lactic acid was elevated at 2.183 by mouth lactic acid 1.78. Hemoglobin stable at 9.9 which is consistent with her baseline. Current INR subtherapeutic at 1.37 with a PT of 17. Glucose 188. Equal occult blood was positive. Chest x-ray was unremarkable. EKG revealed atrial fibrillation with rate control without any specific ST or T-wave changes that would be concerning for acute ischemia.   Review of Systems   In addition to the HPI above,  No Fever-chills, myalgias or other constitutional symptoms No Headache, changes with Vision or hearing, new weakness, tingling, numbness in any extremity, No problems swallowing food or Liquids, indigestion/reflux No palpitations, orthopnea  No Abdominal pain, N/V; no dark tarry stools No dysuria, hematuria or flank pain No new skin rashes, lesions, masses or bruises, No new joints pains-aches No recent weight gain or loss No polyuria, polydypsia or polyphagia,  *A full 10 point Review of Systems was done, except as stated above, all other Review of Systems were negative.  Social History History  Substance Use Topics  . Smoking status: Never Smoker   . Smokeless tobacco: Never  Used  . Alcohol Use: No    Resides at: Private residence  Lives with: Spouse  Ambulatory status: Without assistive devices   Family History Family History  Problem Relation Age of Onset  . Heart disease Mother   . Heart disease Father      Prior to Admission medications   Medication Sig Start Date End Date Taking? Authorizing Provider  atorvastatin (LIPITOR) 80 MG tablet TAKE ONE TABLET BY MOUTH ONCE DAILY 02/12/15  Yes Jettie Booze, MD  Canagliflozin (INVOKANA) 100 MG TABS Take 100 mg by mouth daily.  Yes Historical Provider, MD  clopidogrel (PLAVIX) 75 MG tablet Take 1 tablet (75 mg total) by mouth daily with breakfast. 04/13/15  Yes Rhonda G Barrett, PA-C  digoxin (LANOXIN) 0.125 MG tablet Take 0.125 mg by mouth daily.   Yes Historical Provider, MD  FeFum-FePo-FA-B Cmp-C-Zn-Mn-Cu (TANDEM PLUS PO) Take 1 tablet by mouth 2 (two) times daily.   Yes Historical Provider, MD  folic acid (FOLVITE) A999333 MCG tablet Take 400 mcg by mouth every evening.    Yes Historical Provider, MD  furosemide (LASIX) 80 MG tablet TAKE ONE TABLET BY MOUTH TWICE DAILY 11/13/14  Yes Jettie Booze, MD  glimepiride (AMARYL) 4 MG tablet Take 4 mg by mouth daily before breakfast. 03/24/15  Yes Historical Provider, MD  isosorbide mononitrate (IMDUR) 60 MG 24 hr tablet TAKE ONE TABLET BY MOUTH ONCE DAILY 11/13/14  Yes Jettie Booze, MD  JANUVIA 50 MG tablet Take 50 mg by mouth daily.  11/26/13  Yes Historical Provider, MD  metoprolol (LOPRESSOR) 50 MG tablet Take 50 mg by mouth 2 (two) times daily.   Yes Historical Provider, MD  Multiple Vitamin (MULTIVITAMIN WITH MINERALS) TABS Take 1 tablet by mouth daily.   Yes Historical Provider, MD  nitroGLYCERIN (NITROSTAT) 0.4 MG SL tablet Place 1 tablet (0.4 mg total) under the tongue every 5 (five) minutes as needed for chest pain. 04/06/15  Yes Jettie Booze, MD  Omega-3 Fatty Acids 1200 MG CAPS Take 3 capsules by mouth 2 (two) times daily.   Yes  Historical Provider, MD  pantoprazole (PROTONIX) 40 MG tablet Take 1 tablet (40 mg total) by mouth daily at 6 (six) AM. 04/13/15  Yes Rhonda G Barrett, PA-C  potassium chloride (K-DUR,KLOR-CON) 10 MEQ tablet Take 20 mEq by mouth 2 (two) times daily.    Yes Historical Provider, MD  valsartan (DIOVAN) 80 MG tablet Take 80 mg by mouth daily.   Yes Historical Provider, MD  warfarin (COUMADIN) 5 MG tablet Take 2.5-5 mg by mouth daily. Take 5 mg every day except on Saturday take 2.5 mg   Yes Historical Provider, MD  ZETIA 10 MG tablet TAKE ONE TABLET BY MOUTH ONCE DAILY 03/27/15  Yes Jettie Booze, MD    Allergies  Allergen Reactions  . Darvon [Propoxyphene] Nausea And Vomiting  . Lisinopril Cough  . Septra [Sulfamethoxazole-Trimethoprim]     Increased INR  . Warfarin And Related Other (See Comments)    Generic Warfarin -- pt states it makes her "crazy"  ONLY TOLERATES BRAND  . Penicillins Rash    Physical Exam  Vitals  Blood pressure 111/38, pulse 62, temperature 97.8 F (36.6 C), temperature source Oral, resp. rate 20, SpO2 100 %.   General:  In no acute distress, appears pale  Psych:  Normal affect, Denies Suicidal or Homicidal ideations, Awake Alert, Oriented X 3. Speech and thought patterns are clear and appropriate, no apparent short term memory deficits  Neuro:   No focal neurological deficits, CN II through XII intact, Strength 5/5 all 4 extremities, Sensation intact all 4 extremities.  ENT:  Ears and Eyes appear Normal, Conjunctivae clear but pale, PER. Moist oral mucosa without erythema or exudates.  Neck:  Supple, No lymphadenopathy appreciated  Respiratory:  Symmetrical chest wall movement, Good air movement bilaterally, CTAB. Room Air  Cardiac:  Irregular with underlying atrial fibrillation, No Murmurs, crisp mechanical heart tones noted, no LE edema noted, no JVD, No carotid bruits, peripheral pulses palpable at 2+  Abdomen:  Positive bowel sounds,  Soft, Non  tender, Non distended,  No masses appreciated, no obvious hepatosplenomegaly  Skin:  No Cyanosis, Normal Skin Turgor, No Skin Rash or Bruise. Overall pale appearance.  Extremities: Symmetrical without obvious trauma or injury,  no effusions.  Data Review  CBC  Recent Labs Lab 04/13/15 0250 04/15/15 1145  WBC 7.7 8.3  HGB 9.1* 9.9*  HCT 27.4* 29.7*  PLT 184 232  MCV 92.3 93.7  MCH 30.6 31.2  MCHC 33.2 33.3  RDW 14.5 14.9  LYMPHSABS  --  1.6  MONOABS  --  0.7  EOSABS  --  0.1  BASOSABS  --  0.0    Chemistries   Recent Labs Lab 04/13/15 0250 04/15/15 1145  NA 136 137  K 4.0 4.3  CL 101 103  CO2 26 25  GLUCOSE 116* 188*  BUN 23* 35*  CREATININE 1.62* 2.10*  CALCIUM 8.7* 9.3  AST  --  30  ALT  --  20  ALKPHOS  --  41  BILITOT  --  1.1    estimated creatinine clearance is 18.3 mL/min (by C-G formula based on Cr of 2.1).  No results for input(s): TSH, T4TOTAL, T3FREE, THYROIDAB in the last 72 hours.  Invalid input(s): FREET3  Coagulation profile  Recent Labs Lab 04/12/15 0729 04/13/15 0250 04/15/15 1145  INR 1.24 1.29 1.37    No results for input(s): DDIMER in the last 72 hours.  Cardiac Enzymes  Recent Labs Lab 04/15/15 1145  TROPONINI 0.55*    Invalid input(s): POCBNP  Urinalysis    Component Value Date/Time   COLORURINE YELLOW 04/20/2008 2135   APPEARANCEUR CLEAR 04/20/2008 2135   LABSPEC 1.009 04/20/2008 2135   PHURINE 6.0 04/20/2008 2135   GLUCOSEU NEGATIVE 04/20/2008 2135   HGBUR NEGATIVE 04/20/2008 2135   BILIRUBINUR NEGATIVE 04/20/2008 2135   KETONESUR NEGATIVE 04/20/2008 2135   PROTEINUR NEGATIVE 04/20/2008 2135   UROBILINOGEN 0.2 04/20/2008 2135   NITRITE NEGATIVE 04/20/2008 2135   LEUKOCYTESUR TRACE* 04/20/2008 2135    Imaging results:   Dg Chest Portable 1 View  04/15/2015   CLINICAL DATA:  81 year old presenting with melena and bright red blood per rectum. Coronary stent placed 1 week ago, and patient placed on  anticoagulation with Coumadin and Plavix. Patient complains of generalized weakness and chest pressure worse with exertion. Prior CABG and aortic valve replacement. Prior episodes of GI bleeding related to small bowel AVMs.  EXAM: PORTABLE CHEST - 1 VIEW  COMPARISON:  08/09/2010 and earlier.  FINDINGS: Sternotomy for CABG and aortic valve replacement. Cardiac silhouette upper normal in size for AP portable technique, unchanged. Lungs clear. Bronchovascular markings normal. Pulmonary vascularity normal. No visible pleural effusions. No pneumothorax.  IMPRESSION: Stable borderline heart size.  No acute cardiopulmonary disease.   Electronically Signed   By: Evangeline Dakin M.D.   On: 04/15/2015 12:11     EKG: (Independently reviewed) H her fibrillation with unspecified interventricular conduction delay, rate controlled, no obvious ischemic changes   Assessment & Plan  Principal Problem:   Acute lower GI bleeding/known history AVMs -Admit to stepdown -[place] gastroenterology consulted; unclear if additional endoscopic evaluation indicated given known AVMs-defer to gastroenterology -NPO -Hold Plavix and Coumadin-very tricky situation if bleeding persists given patient needs both Plavix for fresh DES to RCA and warfarin for mechanical heart valve -Typically in the past these bleeds have been self-limiting in nature -So far bleeding has been limited to one stool but will monitor for increased bleeding symptoms  Active Problems:  Acute blood loss anemia -Symptomatic based on patient's appearance, suboptimal blood pressure and complaints of chest pain and shortness of breath -We'll proceed with one unit of packed red blood cells and repeat CBC after transfusion as well as repeat CBC again in a.m.    CAD -recent DES to RCA 04/12/15/Elevated troponin -Cardiology has been consulted and I spoke with Dr. Acie Fredrickson who agrees withholding Plavix at this juncture -Suspect elevated troponin more reflective of  demand ischemia from symptomatic anemia -Agrees with blood transfusion to prevent worsening of cardiac symptoms -Because of suboptimal blood pressures will also hold most of her cardiac medications including Lasix and Diovan. -We'll continue very low-dose beta blocker with hold parameters -Statin on hold while NPO    Chronic anticoagulation/S/P AVR -Presented with subtherapeutic INR and GI bleeding after initiation of Plavix for recent placement of DES - Currently warfarin on hold -Pharmacy to manage once GI bleeding has stopped and we'll resume this medication    DM  -NPO so hold Invokana, Amaryl and Januvia in favor of SSI only  -Follow CBGs  -Current glucose well controlled     Chronic atrial fibrillation -Continue beta blocker and digoxin -CHADVASc = 6    Acute renal failure superimposed on stage 3 chronic kidney disease -Mild azotemia likely related to GI bleeding  -Mild elevation in creatinine  -Hold Lasix     HTN -Blood pressure currently soft so antihypertensives on hold except for very low-dose Lopressor (with ordered hold parameters) -Echocardiogram 7/19 with normal LV function and mild LVH without mention of definitive diastolic dysfunction  -Lasix on hold     Hyperlipidemia -NPO-statin on hold     DVT Prophylaxis: SCDs   Family Communication:    no family at bedside   Code Status:   full code   Condition:  guarded    Discharge disposition: anticipate discharge back to home hopefully in the next 3-4 days pending resolution of GI bleeding as well as no further evidence of bleeding with resumption of antiplatelets and anticoagulation medication   Time spent in minutes : 60      ELLIS,ALLISON L. ANP on 04/15/2015 at 2:32 PM  Between 7am to 7pm - Pager - [phone]  After 7pm go to [email or web address] - password [email or web address]  And look for the night coverage person covering me after hours  [facility]  I have taken an interval history, reviewed the  chart and examined the patient. I agree with the Advanced Practice Provider's note, impression and recommendations. I have made any necessary editorial changes. 81 year old female with history of diabetes, hypertension, dyslipidemia A. fib with mechanical aortic valve replacement on warfarin came with GI bleeding she has a history of AVMs in the past. Patient recently had cardiac cath and was placed on Plavix. GI and cardiology has seen the patient at this time no further intervention recommended. Will hold the Coumadin and Plavix. Follow CBC in a.m.

## 2015-04-15 NOTE — Progress Notes (Signed)
Consultation   History of Present Illness:  This is a 81 yo WF with known avm's as per SBCE in 08/2005, EGD/colonoscopy 2005 at [facility] and internal hems, Enteroscopy  5/2-014 for ongoing low grade GI blood loss showeg gastritis, no avm's in reach of endoscope. She has been on chronic Coumadin for StJude Aortic valve, recently added Plavix following cardiac cath by Dr Scarlette Calico 2 days ago.She restarted coumding 2 days ago. [place], she noticed blood around the stool, Hgb in ED is 9.9, usual 10.0, pt feeling SOB and weak, last BM this morning.Last INR 1.24   Past Medical History  Diagnosis Date  . Hypertension   . Hyperlipemia   . CHF (congestive heart failure)   . Arteriovenous malformation of gastrointestinal tract   . Heart murmur   . Anginal pain   . Type II diabetes mellitus   . Anemia   . History of blood transfusion     "a few times over the years; usually related to my Coumadin" (04/12/2015  . Chronic lower GI bleeding     "today; last time was ~ 8 yr ago; used to have them often before that too" (02/11/2013)  . Macula lutea degeneration   . Coronary artery disease   . Old MI (myocardial infarction)     "a coulple /dr in 02/2008; I never even knew I'd had them" (04/12/2015)  . Arthritis     "fingers mostly" (04/12/2015)  . DJD (degenerative joint disease)   . History of gout   . Chronic kidney disease (CKD), stage III (moderate)    Past Surgical History  Procedure Laterality Date  . Appendectomy  1953  . Aortic valve replacement  ~ 1993  . Coronary artery bypass graft  1990    LIMA-LAD, SVG-OM-PDA, SVG-D1  . Cardiac valve replacement  1994    St. Jude/notes 10/29/2003 (02/11/2013)  . Exploratory laparotomy  02/24/2008    which revealed a retroperitoneal hematoma and bleeding from the right external iliac artery/notes 03/02/2008 (02/11/2013)   . Dilation and curettage of uterus  1960's    'after a miscarriage" (02/11/2013)  . Enteroscopy N/A 02/13/2013    Procedure:  ENTEROSCOPY;  Surgeon: Lafayette Dragon, MD;  Location: [facility] ENDOSCOPY;  Service: Endoscopy;  Laterality: N/A;  . Colonoscopy N/A 02/13/2013    Procedure: COLONOSCOPY;  Surgeon: Lafayette Dragon, MD;  Location: [facility] ENDOSCOPY;  Service: Endoscopy;  Laterality: N/A;  . Cardiac catheterization  ~1990  . Coronary angioplasty with stent placement  2009+    "3 at least; put in 1 stent at a time" (02/11/2013)  . Cardiac catheterization  04/12/2015    Procedure: Coronary Stent Intervention;  Surgeon: Jettie Booze, MD;    SYNERGY DES 3.5X16 to the ostial RCA   . Cardiac catheterization  04/12/2015    Procedure: Coronary/Graft Angiography;  Surgeon: Eloy End, MD; LAD & CFX 100%, patent LIMA-LAD, SVG-D1; SVG-OM-PDA first limb 100%, 2nd limb patent; oRCA 75%>0 w/ stent    reports that she has never smoked. She has never used smokeless tobacco. She reports that she does not drink alcohol or use illicit drugs. family history includes Heart disease in her father and mother. Allergies  Allergen Reactions  . Darvon [Propoxyphene] Nausea And Vomiting  . Lisinopril Cough  . Septra [Sulfamethoxazole-Trimethoprim]     Increased INR  . Warfarin And Related Other (See Comments)    Generic Warfarin -- pt states it makes her "crazy"  ONLY TOLERATES BRAND  . Penicillins Rash  Review of Systems:denies abd. Pain, no N&V  The remainder of the 10 point ROS is negative except as outlined in H&P   Physical Exam: General appearance  Well developed, in no distress.,pale Eyes- non icteric. HEENT nontraumatic, normocephalic. Mouth no lesions, tongue papillated, no cheilosis. Neck supple without adenopathy, thyroid not enlarged, no carotid bruits, no JVD. Lungs Clear to auscultation bilaterally. Cor normal S1, normal S2, regular rhythm,prosthetic valve sound no murmur,  quiet precordium. Abdomen: soft,  Nontender, active b.s's Rectal:just done by  ED MD, heme positive Extremities no pedal edema. Skin  no lesions. Neurological alert and oriented x 3. Psychological normal mood and affect.  Assessment and Plan:  81 yo WF with chr\onic GI blood loss from avm's documented on SBCE 2006, on long term Coumadin, now added Plavix since coronary stent placed several days ago, she is receiving 2 u of PRBC's. She has been fully evaluated from GI standpoint, so we will not plan any additional endoscopic studies. Suggest holding anticoagulants as much as  cardiology allow [place] to do. ,may need Lovenox bridge. PPI,follow h/h , consider RB C scan if bleeding becomes rapid Long term Iron supplements will resume once she has been discharged.   04/15/2015 Delfin Edis

## 2015-04-15 NOTE — ED Notes (Signed)
Lab called to report Troponiin 0.55.  Advised MD.

## 2015-04-15 NOTE — ED Notes (Signed)
Report attempted 

## 2015-04-15 NOTE — ED Notes (Signed)
Blood infusing in IV in left AC.

## 2015-04-15 NOTE — ED Notes (Signed)
Pt sts black stool with some BRB present this am; pt sts had cath last week with stent placed and is on coumadin and plavix; pt sts hx of same in past; pt sts generalized weakness and chest pressure worse with exertion

## 2015-04-15 NOTE — ED Provider Notes (Signed)
CSN: [id number]     Arrival date & time 04/15/15  1049 History   First MD Initiated Contact with Patient 04/15/15 1117     Chief Complaint  Patient presents with  . Rectal Bleeding     (Consider location/radiation/quality/duration/timing/severity/associated sxs/prior Treatment) HPI Comments: Swallowed camera, small veins in stomach occasionally bleed   Patient is a 81 y.o. female presenting with hematochezia.  Rectal Bleeding Quality: black xweeks, bright red blood around stool and bowl this AM. Duration: every other day xweeks, black stool, one time this AM red blood. Timing:  Constant Chronicity:  New Context: defecation   Context: not diarrhea and not rectal pain   Similar prior episodes: yes   Relieved by:  Nothing Worsened by:  Nothing tried Ineffective treatments:  None tried Associated symptoms: no abdominal pain, no fever, no hematemesis, no loss of consciousness and no vomiting   Risk factors: anticoagulant use     Past Medical History  Diagnosis Date  . Hypertension   . Hyperlipemia   . CHF (congestive heart failure)   . Arteriovenous malformation of gastrointestinal tract   . Heart murmur   . Anginal pain   . Type II diabetes mellitus   . Anemia   . History of blood transfusion     "a few times over the years; usually related to my Coumadin" (04/12/2015  . Chronic lower GI bleeding     "today; last time was ~ 8 yr ago; used to have them often before that too" (02/11/2013)  . Macula lutea degeneration   . Coronary artery disease   . Old MI (myocardial infarction)     "a coulple /dr in 02/2008; I never even knew I'd had them" (04/12/2015)  . Arthritis     "fingers mostly" (04/12/2015)  . DJD (degenerative joint disease)   . History of gout   . Chronic kidney disease (CKD), stage III (moderate)    Past Surgical History  Procedure Laterality Date  . Appendectomy  1953  . Aortic valve replacement  ~ 1993  . Coronary artery bypass graft  1990    LIMA-LAD,  SVG-OM-PDA, SVG-D1  . Cardiac valve replacement  1994    St. Jude/notes 10/29/2003 (02/11/2013)  . Exploratory laparotomy  02/24/2008    which revealed a retroperitoneal hematoma and bleeding from the right external iliac artery/notes 03/02/2008 (02/11/2013)   . Dilation and curettage of uterus  1960's    'after a miscarriage" (02/11/2013)  . Enteroscopy N/A 02/13/2013    Procedure: ENTEROSCOPY;  Surgeon: Lafayette Dragon, MD;  Location: [facility] ENDOSCOPY;  Service: Endoscopy;  Laterality: N/A;  . Colonoscopy N/A 02/13/2013    Procedure: COLONOSCOPY;  Surgeon: Lafayette Dragon, MD;  Location: [facility] ENDOSCOPY;  Service: Endoscopy;  Laterality: N/A;  . Cardiac catheterization  ~1990  . Coronary angioplasty with stent placement  2009+    "3 at least; put in 1 stent at a time" (02/11/2013)  . Cardiac catheterization  04/12/2015    Procedure: Coronary Stent Intervention;  Surgeon: Jettie Booze, MD;    SYNERGY DES 3.5X16 to the ostial RCA   . Cardiac catheterization  04/12/2015    Procedure: Coronary/Graft Angiography;  Surgeon: Eloy End, MD; LAD & CFX 100%, patent LIMA-LAD, SVG-D1; SVG-OM-PDA first limb 100%, 2nd limb patent; oRCA 75%>0 w/ stent   Family History  Problem Relation Age of Onset  . Heart disease Mother   . Heart disease Father    History  Substance Use Topics  . Smoking status: Never  Smoker   . Smokeless tobacco: Never Used  . Alcohol Use: No   OB History    No data available     Review of Systems  Constitutional: Positive for fatigue. Negative for fever.  HENT: Negative for sore throat.   Eyes: Negative for visual disturbance.  Respiratory: Positive for chest tightness and shortness of breath (with exertion). Negative for cough.   Cardiovascular: Positive for chest pain (tightness with exertion). Negative for leg swelling.  Gastrointestinal: Positive for blood in stool and hematochezia. Negative for nausea, vomiting, abdominal pain, diarrhea, constipation and hematemesis.   Genitourinary: Negative for difficulty urinating.  Musculoskeletal: Negative for back pain and neck pain.  Skin: Negative for rash.  Neurological: Negative for loss of consciousness, syncope and headaches.      Allergies  Darvon; Lisinopril; Septra; Warfarin and related; and Penicillins  Home Medications   Prior to Admission medications   Medication Sig Start Date End Date Taking? Authorizing Provider  atorvastatin (LIPITOR) 80 MG tablet TAKE ONE TABLET BY MOUTH ONCE DAILY 02/12/15  Yes Jettie Booze, MD  Canagliflozin (INVOKANA) 100 MG TABS Take 100 mg by mouth daily.    Yes Historical Provider, MD  clopidogrel (PLAVIX) 75 MG tablet Take 1 tablet (75 mg total) by mouth daily with breakfast. 04/13/15  Yes Rhonda G Barrett, PA-C  digoxin (LANOXIN) 0.125 MG tablet Take 0.125 mg by mouth daily.   Yes Historical Provider, MD  FeFum-FePo-FA-B Cmp-C-Zn-Mn-Cu (TANDEM PLUS PO) Take 1 tablet by mouth 2 (two) times daily.   Yes Historical Provider, MD  folic acid (FOLVITE) A999333 MCG tablet Take 400 mcg by mouth every evening.    Yes Historical Provider, MD  furosemide (LASIX) 80 MG tablet TAKE ONE TABLET BY MOUTH TWICE DAILY 11/13/14  Yes Jettie Booze, MD  glimepiride (AMARYL) 4 MG tablet Take 4 mg by mouth daily before breakfast. 03/24/15  Yes Historical Provider, MD  isosorbide mononitrate (IMDUR) 60 MG 24 hr tablet TAKE ONE TABLET BY MOUTH ONCE DAILY 11/13/14  Yes Jettie Booze, MD  JANUVIA 50 MG tablet Take 50 mg by mouth daily.  11/26/13  Yes Historical Provider, MD  metoprolol (LOPRESSOR) 50 MG tablet Take 50 mg by mouth 2 (two) times daily.   Yes Historical Provider, MD  Multiple Vitamin (MULTIVITAMIN WITH MINERALS) TABS Take 1 tablet by mouth daily.   Yes Historical Provider, MD  nitroGLYCERIN (NITROSTAT) 0.4 MG SL tablet Place 1 tablet (0.4 mg total) under the tongue every 5 (five) minutes as needed for chest pain. 04/06/15  Yes Jettie Booze, MD  Omega-3 Fatty Acids 1200  MG CAPS Take 3 capsules by mouth 2 (two) times daily.   Yes Historical Provider, MD  pantoprazole (PROTONIX) 40 MG tablet Take 1 tablet (40 mg total) by mouth daily at 6 (six) AM. 04/13/15  Yes Rhonda G Barrett, PA-C  potassium chloride (K-DUR,KLOR-CON) 10 MEQ tablet Take 20 mEq by mouth 2 (two) times daily.    Yes Historical Provider, MD  valsartan (DIOVAN) 80 MG tablet Take 80 mg by mouth daily.   Yes Historical Provider, MD  warfarin (COUMADIN) 5 MG tablet Take 2.5-5 mg by mouth daily. Take 5 mg every day except on Saturday take 2.5 mg   Yes Historical Provider, MD  ZETIA 10 MG tablet TAKE ONE TABLET BY MOUTH ONCE DAILY 03/27/15  Yes Jettie Booze, MD   BP 125/48 mmHg  Pulse 70  Temp(Src) 98.3 F (36.8 C) (Oral)  Resp 19  Ht 5' (  1.524 m)  Wt 137 lb 9.1 oz (62.4 kg)  BMI 26.87 kg/m2  SpO2 100% Physical Exam  Constitutional: She is oriented to person, place, and time. She appears well-developed and well-nourished. No distress.  HENT:  Head: Normocephalic and atraumatic.  Eyes: Conjunctivae and EOM are normal.  Neck: Normal range of motion.  Cardiovascular: Normal rate and intact distal pulses.  An irregularly irregular rhythm present. Exam reveals no gallop and no friction rub.   Murmur heard. Pulmonary/Chest: Effort normal and breath sounds normal. No respiratory distress. She has no wheezes. She has no rales.  Abdominal: Soft. She exhibits no distension. There is no tenderness. There is no guarding.  Genitourinary: Guaiac positive stool.  Black stool  Musculoskeletal: She exhibits no edema or tenderness.  Neurological: She is alert and oriented to person, place, and time.  Skin: Skin is warm and dry. No rash noted. She is not diaphoretic. No erythema.  Nursing note and vitals reviewed.   ED Course  Procedures (including critical care time) Labs Review Labs Reviewed  MRSA PCR SCREENING - Abnormal; Notable for the following:    MRSA by PCR POSITIVE (*)    All other  components within normal limits  CBC WITH DIFFERENTIAL/PLATELET - Abnormal; Notable for the following:    RBC 3.17 (*)    Hemoglobin 9.9 (*)    HCT 29.7 (*)    All other components within normal limits  COMPREHENSIVE METABOLIC PANEL - Abnormal; Notable for the following:    Glucose, Bld 188 (*)    BUN 35 (*)    Creatinine, Ser 2.10 (*)    GFR calc non Af Amer 21 (*)    GFR calc Af Amer 25 (*)    All other components within normal limits  PROTIME-INR - Abnormal; Notable for the following:    Prothrombin Time 17.0 (*)    All other components within normal limits  TROPONIN I - Abnormal; Notable for the following:    Troponin I 0.55 (*)    All other components within normal limits  GLUCOSE, CAPILLARY - Abnormal; Notable for the following:    Glucose-Capillary 130 (*)    All other components within normal limits  POC OCCULT BLOOD, ED - Abnormal; Notable for the following:    Fecal Occult Bld POSITIVE (*)    All other components within normal limits  I-STAT CG4 LACTIC ACID, ED - Abnormal; Notable for the following:    Lactic Acid, Venous 2.18 (*)    All other components within normal limits  I-STAT TROPOININ, ED - Abnormal; Notable for the following:    Troponin i, poc 0.54 (*)    All other components within normal limits  I-STAT TROPOININ, ED - Abnormal; Notable for the following:    Troponin i, poc 0.47 (*)    All other components within normal limits  LIPASE, BLOOD  BASIC METABOLIC PANEL  CBC  I-STAT CG4 LACTIC ACID, ED  CBG MONITORING, ED  I-STAT TROPOININ, ED  TYPE AND SCREEN  PREPARE RBC (CROSSMATCH)    Imaging Review Dg Chest Portable 1 View  04/15/2015   CLINICAL DATA:  81 year old presenting with melena and bright red blood per rectum. Coronary stent placed 1 week ago, and patient placed on anticoagulation with Coumadin and Plavix. Patient complains of generalized weakness and chest pressure worse with exertion. Prior CABG and aortic valve replacement. Prior episodes  of GI bleeding related to small bowel AVMs.  EXAM: PORTABLE CHEST - 1 VIEW  COMPARISON:  08/09/2010 and earlier.  FINDINGS: Sternotomy for CABG and aortic valve replacement. Cardiac silhouette upper normal in size for AP portable technique, unchanged. Lungs clear. Bronchovascular markings normal. Pulmonary vascularity normal. No visible pleural effusions. No pneumothorax.  IMPRESSION: Stable borderline heart size.  No acute cardiopulmonary disease.   Electronically Signed   By: Evangeline Dakin M.D.   On: 04/15/2015 12:11     EKG Interpretation   Date/Time:  Sunday April 15 2015 11:13:26 EDT Ventricular Rate:  79 PR Interval:    QRS Duration: 126 QT Interval:  386 QTC Calculation: 442 R Axis:   -57 Text Interpretation:  Atrial fibrillation , rate controlled Left axis  deviation Non-specific intra-ventricular conduction block Abnormal ECG  Compared to previous tracing, patient now in atrial fibrillation Confirmed  by [facility] MD, Erza Mothershead ([id number]) on 04/15/2015 11:45:36 AM      MDM   Final diagnoses:  Acute lower GI bleeding/known history AVMs  Lactic acidosis  Anticoagulated  Troponin level elevated  Acute kidney injury    81yo female with history of atrial fibrillation, AVR, CAD (with stent placed in Thursday) on plavix and coumadin, htn, hlpd, DM, chronic kidney disease, history of GI bleeding secondary to AVM presents with concern for worsening black stool over last several weeks and one episode of bright red blood in toilet bowel with BM this AM.  Pt hemodynamically stable on arrival, however describing anginal symptoms with exertion including CP and dyspnea.    2 IVs established, pt given protonix, CBC showed hgb 9.9 (baseline around 10) and INR 1.37.  Patient initially with lactic acidosis which improved and elevated troponin which remained stable, likely secondary to blood loss anemia, or decreasing from recent stent placement.  Patient without episodes of bloody BM in ED, and does  not describe large amount of blood.  Pt describes chronic black stool from iron tablets however worsening in last 3 weeks.  Given pt hds with hemoglobin close to baseline and not describing large amounts of bleeding will not transfuse emergently.  Did not give aspirin for troponin elevation given concern of bleed.  No need for coumadin reversal at this time.  Pt to be admitted to medicine for further care, evaluation of risks/benefits of anticoagulation in setting of suspected GI bleed in pt with history of AVMs, CAD with recent stent placement and positive troponin.      Gareth Morgan, MD 04/15/15 2111

## 2015-04-15 NOTE — ED Notes (Signed)
Heart failure MD at bedside.

## 2015-04-15 NOTE — Progress Notes (Signed)
Pt admitted from ED oriented x 4 no c/o pain UPC infusing in Lt A/C. Husband acompanied pt. CHG and MRSA swab done.

## 2015-04-15 NOTE — Telephone Encounter (Signed)
[name] is a 81 y.o. female with hx of mechanical AVR, CAD s/p CABG, prior GI bleed 2/2 AVMs.  She underwent PCI with DES to RCA 04/12/15. She was DC on Plavix and Coumadin. Today, she noted bright red blood per rectum with a bowel movent.  Her stools are typically dark b/c of Iron therapy.  They have been darker.  She is still having chest tightness and SOB. There was no improvement with recent PCI. She feels weak.  She denies syncope.    Recent Labs  04/05/15 1608 04/13/15 0250  HGB 10.4* 9.1*   Given her hx and past medical problems, I have recommended that she come to [facility] for further evaluation.  She agrees with this plan.  Richardson Dopp, PA-C   04/15/2015 10:12 AM

## 2015-04-16 DIAGNOSIS — R7989 Other specified abnormal findings of blood chemistry: Secondary | ICD-10-CM

## 2015-04-16 DIAGNOSIS — N183 Chronic kidney disease, stage 3 (moderate): Secondary | ICD-10-CM

## 2015-04-16 LAB — BASIC METABOLIC PANEL
ANION GAP: 7 (ref 5–15)
BUN: 31 mg/dL — ABNORMAL HIGH (ref 6–20)
CALCIUM: 8.3 mg/dL — AB (ref 8.9–10.3)
CHLORIDE: 104 mmol/L (ref 101–111)
CO2: 24 mmol/L (ref 22–32)
Creatinine, Ser: 1.84 mg/dL — ABNORMAL HIGH (ref 0.44–1.00)
GFR calc Af Amer: 29 mL/min — ABNORMAL LOW (ref >60)
GFR calc non Af Amer: 25 mL/min — ABNORMAL LOW (ref >60)
GLUCOSE: 92 mg/dL (ref 65–99)
Potassium: 3.9 mmol/L (ref 3.5–5.1)
Sodium: 135 mmol/L (ref 135–145)

## 2015-04-16 LAB — TYPE AND SCREEN
ABO/RH(D): O NEG
ANTIBODY SCREEN: NEGATIVE
UNIT DIVISION: 0

## 2015-04-16 LAB — CBC
HEMATOCRIT: 26.8 % — AB (ref 36.0–46.0)
Hemoglobin: 9 g/dL — ABNORMAL LOW (ref 12.0–15.0)
MCH: 30.9 pg (ref 26.0–34.0)
MCHC: 33.6 g/dL (ref 30.0–36.0)
MCV: 92.1 fL (ref 78.0–100.0)
PLATELETS: 168 10*3/uL (ref 150–400)
RBC: 2.91 MIL/uL — ABNORMAL LOW (ref 3.87–5.11)
RDW: 15.3 % (ref 11.5–15.5)
WBC: 7.1 10*3/uL (ref 4.0–10.5)

## 2015-04-16 LAB — GLUCOSE, CAPILLARY
GLUCOSE-CAPILLARY: 97 mg/dL (ref 65–99)
GLUCOSE-CAPILLARY: 155 mg/dL — AB (ref 65–99)
Glucose-Capillary: 136 mg/dL — ABNORMAL HIGH (ref 65–99)
Glucose-Capillary: 143 mg/dL — ABNORMAL HIGH (ref 65–99)
Glucose-Capillary: 85 mg/dL (ref 65–99)
Glucose-Capillary: 99 mg/dL (ref 65–99)

## 2015-04-16 MED ORDER — ZOLPIDEM TARTRATE 5 MG PO TABS
5.0000 mg | ORAL_TABLET | Freq: Every evening | ORAL | Status: DC | PRN
  Administered 2015-04-16 – 2015-04-24 (×8): 5 mg via ORAL
  Filled 2015-04-16 (×8): qty 1

## 2015-04-16 MED ORDER — WARFARIN - PHARMACIST DOSING INPATIENT
Freq: Every day | Status: DC
  Administered 2015-04-16: 1
  Administered 2015-04-19 – 2015-04-24 (×3)

## 2015-04-16 MED ORDER — INSULIN ASPART 100 UNIT/ML ~~LOC~~ SOLN
0.0000 [IU] | Freq: Three times a day (TID) | SUBCUTANEOUS | Status: DC
  Administered 2015-04-17: 2 [IU] via SUBCUTANEOUS
  Administered 2015-04-18 – 2015-04-20 (×4): 1 [IU] via SUBCUTANEOUS
  Administered 2015-04-20: 2 [IU] via SUBCUTANEOUS
  Administered 2015-04-21 – 2015-04-25 (×10): 1 [IU] via SUBCUTANEOUS

## 2015-04-16 MED ORDER — WARFARIN SODIUM 5 MG PO TABS
5.0000 mg | ORAL_TABLET | Freq: Once | ORAL | Status: AC
  Administered 2015-04-16: 5 mg via ORAL
  Filled 2015-04-16: qty 1

## 2015-04-16 MED ORDER — ZOLPIDEM TARTRATE 5 MG PO TABS
5.0000 mg | ORAL_TABLET | Freq: Once | ORAL | Status: AC
  Administered 2015-04-16: 5 mg via ORAL
  Filled 2015-04-16: qty 1

## 2015-04-16 MED ORDER — HYDROCORTISONE ACETATE 25 MG RE SUPP
25.0000 mg | Freq: Two times a day (BID) | RECTAL | Status: AC
  Administered 2015-04-16 – 2015-04-18 (×5): 25 mg via RECTAL
  Filled 2015-04-16 (×8): qty 1

## 2015-04-16 NOTE — Progress Notes (Signed)
Daily Rounding Note  04/16/2015, 8:06 AM  LOS: 1 day   SUBJECTIVE:       No further stools or BPR.   OBJECTIVE:         Vital signs in last 24 hours:    Temp:  [97.6 F (36.4 C)-98.3 F (36.8 C)] 97.6 F (36.4 C) (07/25 0330) Pulse Rate:  [53-70] 62 (07/25 0330) Resp:  [16-21] 16 (07/25 0330) BP: (93-138)/(37-63) 113/45 mmHg (07/25 0330) SpO2:  [98 %-100 %] 99 % (07/25 0330) Weight:  [137 lb 9.1 oz (62.4 kg)-139 lb 12.4 oz (63.4 kg)] 139 lb 12.4 oz (63.4 kg) (07/25 0616) Last BM Date: 04/15/15 Filed Weights   04/15/15 1615 04/16/15 0616  Weight: 137 lb 9.1 oz (62.4 kg) 139 lb 12.4 oz (63.4 kg)   General: pleasant, looks well.    Heart:  Irreg, irreg.  Rate controlled Chest: clear bil Abdomen: soft, NT, ND.  Active BS  Extremities: no CCE Neuro/Psych:  Pleasnt, alert, relaxed, appropriated.  Oriented x 3.  Excellent historian.   Intake/Output from previous day: 07/24 0701 - 07/25 0700 In: 2855 [P.O.:120; I.V.:2400; Blood:335] Out: 1400 [Urine:1400]  Intake/Output this shift:    Lab Results:  Recent Labs  04/15/15 1145 04/16/15 0318  WBC 8.3 7.1  HGB 9.9* 9.0*  HCT 29.7* 26.8*  PLT 232 168   BMET  Recent Labs  04/15/15 1145 04/16/15 0318  NA 137 135  K 4.3 3.9  CL 103 104  CO2 25 24  GLUCOSE 188* 92  BUN 35* 31*  CREATININE 2.10* 1.84*  CALCIUM 9.3 8.3*   LFT  Recent Labs  04/15/15 1145  PROT 7.6  ALBUMIN 3.7  AST 30  ALT 20  ALKPHOS 41  BILITOT 1.1   PT/INR  Recent Labs  04/15/15 1145  LABPROT 17.0*  INR 1.37   Hepatitis Panel No results for input(s): HEPBSAG, HCVAB, HEPAIGM, HEPBIGM in the last 72 hours.  Studies/Results: Dg Chest Portable 1 View  04/15/2015   CLINICAL DATA:  81 year old presenting with melena and bright red blood per rectum. Coronary stent placed 1 week ago, and patient placed on anticoagulation with Coumadin and Plavix. Patient complains of  generalized weakness and chest pressure worse with exertion. Prior CABG and aortic valve replacement. Prior episodes of GI bleeding related to small bowel AVMs.  EXAM: PORTABLE CHEST - 1 VIEW  COMPARISON:  08/09/2010 and earlier.  FINDINGS: Sternotomy for CABG and aortic valve replacement. Cardiac silhouette upper normal in size for AP portable technique, unchanged. Lungs clear. Bronchovascular markings normal. Pulmonary vascularity normal. No visible pleural effusions. No pneumothorax.  IMPRESSION: Stable borderline heart size.  No acute cardiopulmonary disease.   Electronically Signed   By: Evangeline Dakin M.D.   On: 04/15/2015 12:11   Scheduled Meds: . Chlorhexidine Gluconate Cloth  6 each Topical Q0600  . digoxin  0.125 mg Oral Daily  . insulin aspart  0-9 Units Subcutaneous 6 times per day  . metoprolol tartrate  12.5 mg Oral BID  . mupirocin ointment  1 application Nasal BID  . pantoprazole  40 mg Oral Q0600  . sodium chloride  3 mL Intravenous Q12H   Continuous Infusions: . sodium chloride 50 mL/hr (04/16/15 0919)   PRN Meds:.ondansetron **OR** ondansetron (ZOFRAN) IV  ASSESMENT:   *  Single episode minor blood pr (streak blood in brown stool) in setting of coumadin, plavix. Internal hemorrhoids, small rectal polyp (not biopsied or removed) noted 2005  after egd/colon. 2006 SBCE: scattered SB AVMs.  01/2013 enteroscopy: gastritis, no avms. 01/2013 colonoscopy negative. On daily PPI per home routine.   *  Chronic coumadin for Mechanical Ao valve, a fib.  Plavix initiated post cath/DES stent 7/21.   *  ABL anemia.  S/p PRBC x 1. Hgb down close to 1 unit. Last previous transfusion 01/2013.  On ferrous fumarate at home.   *  CKD, AKI.  Stage 4.    PLAN   *  Add 3 days of Anusol PR.  Oral PPI.   *  Colonoscopy? If not: ?Restart Plavix/Coumadin  *  Advanced diet to carb mod.      [name]  04/16/2015, 8:06 AM Pager: [phone]     Attending physician's note   I have taken  an interval history, reviewed the chart and examined the patient. I agree with the Advanced Practitioner's note, impression and recommendations. Minor hematochezia likely hemorrhoids and will treat with Anusol supp. Has known SB AVMs. Colonoscopy was negative in 01/2013 and enteroscopy showed gastritis in 01/2013 so no plans to repeat either study. Advance diet as tolerated. Can restart Coumadin as indicated and Plavix next. Watch for recurrent bleeding. GI available if needed. GI signing off.   Pricilla Riffle. Fuller Plan, MD  [facility]

## 2015-04-16 NOTE — Progress Notes (Signed)
ANTICOAGULATION CONSULT NOTE - Initial Consult  Pharmacy Consult for Warfarin Indication: mechanical valve  Allergies  Allergen Reactions  . Darvon [Propoxyphene] Nausea And Vomiting  . Lisinopril Cough  . Septra [Sulfamethoxazole-Trimethoprim]     Increased INR  . Warfarin And Related Other (See Comments)    Generic Warfarin -- pt states it makes her "crazy"  ONLY TOLERATES BRAND  . Penicillins Rash    Patient Measurements: Height: 5' (152.4 cm) Weight: 139 lb 12.4 oz (63.4 kg) IBW/kg (Calculated) : 45.5 Heparin Dosing Weight:   Vital Signs: Temp: 97.6 F (36.4 C) (07/25 1300) Temp Source: Oral (07/25 1300) BP: 98/68 mmHg (07/25 1300) Pulse Rate: 64 (07/25 1300)  Labs:  Recent Labs  04/15/15 1145 04/16/15 0318  HGB 9.9* 9.0*  HCT 29.7* 26.8*  PLT 232 168  LABPROT 17.0*  --   INR 1.37  --   CREATININE 2.10* 1.84*  TROPONINI 0.55*  --     Estimated Creatinine Clearance: 20.6 mL/min (by C-G formula based on Cr of 1.84).   Medical History: Past Medical History  Diagnosis Date  . Hypertension   . Hyperlipemia   . CHF (congestive heart failure)   . Arteriovenous malformation of gastrointestinal tract   . Heart murmur   . Anginal pain   . Type II diabetes mellitus   . Anemia   . History of blood transfusion     "a few times over the years; usually related to my Coumadin" (04/12/2015  . Chronic lower GI bleeding     "today; last time was ~ 8 yr ago; used to have them often before that too" (02/11/2013)  . Macula lutea degeneration   . Coronary artery disease   . Old MI (myocardial infarction)     "a coulple /dr in 02/2008; I never even knew I'd had them" (04/12/2015)  . Arthritis     "fingers mostly" (04/12/2015)  . DJD (degenerative joint disease)   . History of gout   . Chronic kidney disease (CKD), stage III (moderate)     Medications:  Prescriptions prior to admission  Medication Sig Dispense Refill Last Dose  . atorvastatin (LIPITOR) 80 MG tablet  TAKE ONE TABLET BY MOUTH ONCE DAILY 30 tablet 3 04/14/2015 at Unknown time  . Canagliflozin (INVOKANA) 100 MG TABS Take 100 mg by mouth daily.    04/15/2015 at Unknown time  . clopidogrel (PLAVIX) 75 MG tablet Take 1 tablet (75 mg total) by mouth daily with breakfast. 30 tablet 11 04/15/2015 at Unknown time  . digoxin (LANOXIN) 0.125 MG tablet Take 0.125 mg by mouth daily.   04/15/2015 at Unknown time  . FeFum-FePo-FA-B Cmp-C-Zn-Mn-Cu (TANDEM PLUS PO) Take 1 tablet by mouth 2 (two) times daily.   04/15/2015 at Unknown time  . folic acid (FOLVITE) A999333 MCG tablet Take 400 mcg by mouth every evening.    04/14/2015 at Unknown time  . furosemide (LASIX) 80 MG tablet TAKE ONE TABLET BY MOUTH TWICE DAILY 60 tablet 6 04/15/2015 at Unknown time  . glimepiride (AMARYL) 4 MG tablet Take 4 mg by mouth daily before breakfast.   04/15/2015 at Unknown time  . isosorbide mononitrate (IMDUR) 60 MG 24 hr tablet TAKE ONE TABLET BY MOUTH ONCE DAILY 30 tablet 6 04/15/2015 at Unknown time  . JANUVIA 50 MG tablet Take 50 mg by mouth daily.    04/15/2015 at Unknown time  . metoprolol (LOPRESSOR) 50 MG tablet Take 50 mg by mouth 2 (two) times daily.   04/15/2015 at 0900  .  Multiple Vitamin (MULTIVITAMIN WITH MINERALS) TABS Take 1 tablet by mouth daily.   04/14/2015 at Unknown time  . nitroGLYCERIN (NITROSTAT) 0.4 MG SL tablet Place 1 tablet (0.4 mg total) under the tongue every 5 (five) minutes as needed for chest pain. 25 tablet 1 PRN  . Omega-3 Fatty Acids 1200 MG CAPS Take 3 capsules by mouth 2 (two) times daily.   04/15/2015 at Unknown time  . pantoprazole (PROTONIX) 40 MG tablet Take 1 tablet (40 mg total) by mouth daily at 6 (six) AM. 30 tablet 11 04/15/2015 at Unknown time  . potassium chloride (K-DUR,KLOR-CON) 10 MEQ tablet Take 20 mEq by mouth 2 (two) times daily.    04/15/2015 at Unknown time  . valsartan (DIOVAN) 80 MG tablet Take 80 mg by mouth daily.   04/15/2015 at Unknown time  . warfarin (COUMADIN) 5 MG tablet Take 2.5-5 mg  by mouth daily. Take 5 mg every day except on Saturday take 2.5 mg   04/14/2015 at Unknown time  . ZETIA 10 MG tablet TAKE ONE TABLET BY MOUTH ONCE DAILY 90 tablet 0 04/14/2015 at Unknown time   Scheduled:  . Chlorhexidine Gluconate Cloth  6 each Topical Q0600  . digoxin  0.125 mg Oral Daily  . hydrocortisone  25 mg Rectal BID  . insulin aspart  0-9 Units Subcutaneous 6 times per day  . metoprolol tartrate  12.5 mg Oral BID  . mupirocin ointment  1 application Nasal BID  . pantoprazole  40 mg Oral Q0600  . sodium chloride  3 mL Intravenous Q12H   Infusions:  . sodium chloride 50 mL/hr (04/16/15 1501)    Assessment: 81yo female with history of prosthetic aortic valve presents with GIB. Pharmacy is consulted to dose warfarin for mechanical valve. INR 7/24 was 1.37, Hgb 9.0, Plt 168, sCr 1.84. GI ok to restart warfarin with minor hematochezia.   PTA Warfarin Dosing: 2.5mg  Sat and 5mg  AODs  Goal of Therapy:  INR 2-3 Monitor platelets by anticoagulation protocol: Yes   Plan:  Warfarin 5mg  tonight x1 Daily INR/CBC Monitor s/sx of bleeding  Andrey Cota. Diona Foley, PharmD Clinical Pharmacist Pager [phone] 04/16/2015,4:37 PM

## 2015-04-16 NOTE — Telephone Encounter (Signed)
Pt readmitted to the hospital on 04/15/15 with GI bleed. Currently admitted.

## 2015-04-16 NOTE — Progress Notes (Signed)
Utilization Review Completed.  

## 2015-04-16 NOTE — Progress Notes (Signed)
PROGRESS NOTE  [name] [id number] DOB: October 13, 1932 DOA: 04/15/2015 PCP: Simona Huh, MD  Brief history 81 year old female with a history of diabetes mellitus, hypertension, dyslipidemia, atrial fibrillation and mechanical aortic valve replacement on warfarin, recurrent GI bleeding and setting of AVMs, known CAD with previous bare metal stents and recently his charge 7/22 after undergoing placement of DES to the RCA 7/21. She was started on Plavix. Patient presented to the ER with rectal bleeding. Episode was associated with chest tightness and shortness of breath. The patient's hemoglobin was 9.9 at the time of admission. She was transfused 1 unit of PRBC. Cardiology and gastroenterology were consulted. Initially, the patient's warfarin and Plavix were discontinued. However, the patient was restarted on her Coumadin and Plavix on 04/16/2015 with close monitoring.  Assessment/Plan:  Acute lower GI bleeding/known history SB AVMs -Appreciate [facility]ology-->no further interventions at this time -started on Anusol -Restart Plavix and Coumadin 7/25 -Typically in the past these bleeds have been self-limiting in nature -So far bleeding has been limited to one stool but will monitor for increased bleeding symptoms -Colonoscopy was negative in 01/2013 and enteroscopy showed gastritis in 01/2013 so no plans to repeat either study -7/25--no bloody BMs  Chronic blood loss anemia -Symptomatic based on patient's appearance, suboptimal blood pressure and complaints of chest pain and shortness of breath -one unit PRBC on 7/24 -Monitor hemoglobin   CAD -recent DES to RCA 04/12/15/Elevated troponin -appreciate cardiology-->Plavix x 3 months at minimum -Suspect elevated troponin more reflective of demand ischemia from symptomatic anemia -Agrees with blood transfusion to prevent worsening of cardiac symptoms -Because of suboptimal blood pressures will also hold most of her  cardiac medications including Lasix and Diovan. -We'll continue very low-dose beta blocker with hold parameters   Chronic anticoagulation/S/P Mechanical AVR -Initial INR 1.37 -Restart warfarin   DM  -hold Invokana, Amaryl and Januvia in favor of SSI only  -Follow CBGs  -Current glucose well controlled  -Hemoglobin A1c   Chronic atrial fibrillation -Continue beta blocker and digoxin -CHADVASc = 6   Acute renal failure superimposed on stage 3 chronic kidney disease -Mild azotemia likely related to GI bleeding  -Mild elevation in creatinine --2.10 at the time of admission -Hold Lasix  -Baseline creatinine 1.6-1.8   HTN -Blood pressure currently soft so antihypertensives on hold except for very low-dose Lopressor (with ordered hold parameters) -Echocardiogram 7/19 with normal LV function and mild LVH without mention of definitive diastolic dysfunction  -Lasix on hold    Hyperlipidemia -restart lipitor    Family Communication:   Husband updated at beside 7/25 Disposition Plan:   Home when medically stable     Procedures/Studies: Dg Chest Portable 1 View  04/15/2015   CLINICAL DATA:  81 year old presenting with melena and bright red blood per rectum. Coronary stent placed 1 week ago, and patient placed on anticoagulation with Coumadin and Plavix. Patient complains of generalized weakness and chest pressure worse with exertion. Prior CABG and aortic valve replacement. Prior episodes of GI bleeding related to small bowel AVMs.  EXAM: PORTABLE CHEST - 1 VIEW  COMPARISON:  08/09/2010 and earlier.  FINDINGS: Sternotomy for CABG and aortic valve replacement. Cardiac silhouette upper normal in size for AP portable technique, unchanged. Lungs clear. Bronchovascular markings normal. Pulmonary vascularity normal. No visible pleural effusions. No pneumothorax.  IMPRESSION: Stable borderline heart size.  No acute cardiopulmonary disease.   Electronically Signed   By: Sherran Needs.D.  On: 04/15/2015 12:11         Subjective: Patient denies fevers, chills, headache, chest pain, dyspnea, nausea, vomiting, diarrhea, abdominal pain, dysuria, hematuria   Objective: Filed Vitals:   04/16/15 0904 04/16/15 1300 04/16/15 1656 04/16/15 1800  BP: 114/46 98/68 116/50   Pulse: 71 64 68   Temp:  97.6 F (36.4 C) 97.5 F (36.4 C)   TempSrc:  Oral Oral   Resp:  16 21   Height:      Weight:      SpO2:  99%  100%    Intake/Output Summary (Last 24 hours) at 04/16/15 1829 Last data filed at 04/16/15 1800  Gross per 24 hour  Intake   2700 ml  Output   1700 ml  Net   1000 ml   Weight change:  Exam:   General:  Pt is alert, follows commands appropriately, not in acute distress  HEENT: No icterus, No thrush, No neck mass, [place]/AT  Cardiovascular: RRR, S1/S2, no rubs, no gallops  Respiratory: CTA bilaterally, no wheezing, no crackles, no rhonchi  Abdomen: Soft/+BS, non tender, non distended, no guarding  Extremities: 1+LE edema, No lymphangitis, No petechiae, No rashes, no synovitis  Data Reviewed: Basic Metabolic Panel:  Recent Labs Lab 04/13/15 0250 04/15/15 1145 04/16/15 0318  NA 136 137 135  K 4.0 4.3 3.9  CL 101 103 104  CO2 26 25 24   GLUCOSE 116* 188* 92  BUN 23* 35* 31*  CREATININE 1.62* 2.10* 1.84*  CALCIUM 8.7* 9.3 8.3*   Liver Function Tests:  Recent Labs Lab 04/15/15 1145  AST 30  ALT 20  ALKPHOS 41  BILITOT 1.1  PROT 7.6  ALBUMIN 3.7    Recent Labs Lab 04/15/15 1145  LIPASE 35   No results for input(s): AMMONIA in the last 168 hours. CBC:  Recent Labs Lab 04/13/15 0250 04/15/15 1145 04/16/15 0318  WBC 7.7 8.3 7.1  NEUTROABS  --  5.8  --   HGB 9.1* 9.9* 9.0*  HCT 27.4* 29.7* 26.8*  MCV 92.3 93.7 92.1  PLT 184 232 168   Cardiac Enzymes:  Recent Labs Lab 04/15/15 1145  TROPONINI 0.55*   BNP: Invalid input(s): POCBNP CBG:  Recent Labs Lab 04/16/15 0003 04/16/15 0328 04/16/15 0806  04/16/15 1244 04/16/15 1652  GLUCAP 85 97 99 155* 136*    Recent Results (from the past 240 hour(s))  MRSA PCR Screening     Status: Abnormal   Collection Time: 04/15/15  4:19 PM  Result Value Ref Range Status   MRSA by PCR POSITIVE (A) NEGATIVE Final    Comment:        The GeneXpert MRSA Assay (FDA approved for NASAL specimens only), is one component of a comprehensive MRSA colonization surveillance program. It is not intended to diagnose MRSA infection nor to guide or monitor treatment for MRSA infections. RESULT CALLED TO, READ BACK BY AND VERIFIED WITH: HOOD,T RN 04/15/15 1750 WOOTEN,K      Scheduled Meds: . Chlorhexidine Gluconate Cloth  6 each Topical Q0600  . digoxin  0.125 mg Oral Daily  . hydrocortisone  25 mg Rectal BID  . insulin aspart  0-9 Units Subcutaneous 6 times per day  . metoprolol tartrate  12.5 mg Oral BID  . mupirocin ointment  1 application Nasal BID  . pantoprazole  40 mg Oral Q0600  . sodium chloride  3 mL Intravenous Q12H  . Warfarin - Pharmacist Dosing Inpatient   Does not apply q1800   Continuous  Infusions: . sodium chloride 50 mL/hr (04/16/15 1501)     Seanpatrick Maisano, DO  [facility] Pager [phone]  If 7PM-7AM, please contact night-coverage [email or web address] Password [facility] 04/16/2015, 6:29 PM   LOS: 1 day

## 2015-04-16 NOTE — Progress Notes (Signed)
SUBJECTIVE:  No further bleeding.  Chest tightness with walking much improved since stent placement last Thursday 7/21.   OBJECTIVE:   Vitals:   Filed Vitals:   04/16/15 0330 04/16/15 0616 04/16/15 0745 04/16/15 0904  BP: 113/45  114/46 114/46  Pulse: 62  65 71  Temp: 97.6 F (36.4 C)  97.5 F (36.4 C)   TempSrc: Oral  Oral   Resp: 16  18   Height:      Weight:  139 lb 12.4 oz (63.4 kg)    SpO2: 99%  99%    I&O's:   Intake/Output Summary (Last 24 hours) at 04/16/15 0949 Last data filed at 04/16/15 0900  Gross per 24 hour  Intake   3345 ml  Output   2000 ml  Net   1345 ml   TELEMETRY: Reviewed telemetry pt in NSR:     PHYSICAL EXAM General: Well developed, well nourished, in no acute distress Head:   Normal cephalic and atramatic  Lungs:   Clear bilaterally to auscultation. Heart:   HRRR S1 S2  No JVD.   Abdomen: abdomen soft and non-tender Msk:  Back normal,  Normal strength and tone for age. Extremities:   No edema.   Neuro: Alert and oriented. Psych:  Normal affect, responds appropriately Skin: No rash   LABS: Basic Metabolic Panel:  Recent Labs  04/15/15 1145 04/16/15 0318  NA 137 135  K 4.3 3.9  CL 103 104  CO2 25 24  GLUCOSE 188* 92  BUN 35* 31*  CREATININE 2.10* 1.84*  CALCIUM 9.3 8.3*   Liver Function Tests:  Recent Labs  04/15/15 1145  AST 30  ALT 20  ALKPHOS 41  BILITOT 1.1  PROT 7.6  ALBUMIN 3.7    Recent Labs  04/15/15 1145  LIPASE 35   CBC:  Recent Labs  04/15/15 1145 04/16/15 0318  WBC 8.3 7.1  NEUTROABS 5.8  --   HGB 9.9* 9.0*  HCT 29.7* 26.8*  MCV 93.7 92.1  PLT 232 168   Cardiac Enzymes:  Recent Labs  04/15/15 1145  TROPONINI 0.55*   BNP: Invalid input(s): POCBNP D-Dimer: No results for input(s): DDIMER in the last 72 hours. Hemoglobin A1C: No results for input(s): HGBA1C in the last 72 hours. Fasting Lipid Panel: No results for input(s): CHOL, HDL, LDLCALC, TRIG, CHOLHDL, LDLDIRECT in the last  72 hours. Thyroid Function Tests: No results for input(s): TSH, T4TOTAL, T3FREE, THYROIDAB in the last 72 hours.  Invalid input(s): FREET3 Anemia Panel: No results for input(s): VITAMINB12, FOLATE, FERRITIN, TIBC, IRON, RETICCTPCT in the last 72 hours. Coag Panel:   Lab Results  Component Value Date   INR 1.37 04/15/2015   INR 1.29 04/13/2015   INR 1.24 04/12/2015    RADIOLOGY: Dg Chest Portable 1 View  04/15/2015   CLINICAL DATA:  81 year old presenting with melena and bright red blood per rectum. Coronary stent placed 1 week ago, and patient placed on anticoagulation with Coumadin and Plavix. Patient complains of generalized weakness and chest pressure worse with exertion. Prior CABG and aortic valve replacement. Prior episodes of GI bleeding related to small bowel AVMs.  EXAM: PORTABLE CHEST - 1 VIEW  COMPARISON:  08/09/2010 and earlier.  FINDINGS: Sternotomy for CABG and aortic valve replacement. Cardiac silhouette upper normal in size for AP portable technique, unchanged. Lungs clear. Bronchovascular markings normal. Pulmonary vascularity normal. No visible pleural effusions. No pneumothorax.  IMPRESSION: Stable borderline heart size.  No acute cardiopulmonary disease.   Electronically  Signed   By: Evangeline Dakin M.D.   On: 04/15/2015 12:11      ASSESSMENT: s/p Synergy DES to ostial RCA. GI bleed,  H/o AVMs.  PLAN:  Continue clopidogrel for at least 3 months without interruption.  If she needs to stop Clopidogrel at the end of October due to bleeding, that would be reasonable.  If she is stable at that point from a bleeding standopint, would continue as long as possible, but 3 months would be the minimum.   Coumadin required for her prosthetic aortic valve.  INR subtherapeutic.  Hopefully we can restart the Coumadin per GI soon to minimize the risk of valve thrombosis.    CRI: stable now.   Jettie Booze, MD  04/16/2015  9:49 AM

## 2015-04-16 NOTE — Progress Notes (Signed)
Text page to DR Barlow cleared pt to be able to start Coumadin again.

## 2015-04-17 DIAGNOSIS — Z954 Presence of other heart-valve replacement: Secondary | ICD-10-CM

## 2015-04-17 DIAGNOSIS — I10 Essential (primary) hypertension: Secondary | ICD-10-CM

## 2015-04-17 DIAGNOSIS — N179 Acute kidney failure, unspecified: Secondary | ICD-10-CM

## 2015-04-17 DIAGNOSIS — R7989 Other specified abnormal findings of blood chemistry: Secondary | ICD-10-CM

## 2015-04-17 DIAGNOSIS — Z7901 Long term (current) use of anticoagulants: Secondary | ICD-10-CM | POA: Insufficient documentation

## 2015-04-17 DIAGNOSIS — R778 Other specified abnormalities of plasma proteins: Secondary | ICD-10-CM | POA: Insufficient documentation

## 2015-04-17 LAB — BASIC METABOLIC PANEL
ANION GAP: 6 (ref 5–15)
BUN: 21 mg/dL — ABNORMAL HIGH (ref 6–20)
CO2: 24 mmol/L (ref 22–32)
Calcium: 8.5 mg/dL — ABNORMAL LOW (ref 8.9–10.3)
Chloride: 107 mmol/L (ref 101–111)
Creatinine, Ser: 1.58 mg/dL — ABNORMAL HIGH (ref 0.44–1.00)
GFR calc Af Amer: 35 mL/min — ABNORMAL LOW (ref >60)
GFR calc non Af Amer: 30 mL/min — ABNORMAL LOW (ref >60)
Glucose, Bld: 100 mg/dL — ABNORMAL HIGH (ref 65–99)
Potassium: 4 mmol/L (ref 3.5–5.1)
Sodium: 137 mmol/L (ref 135–145)

## 2015-04-17 LAB — CBC
HEMATOCRIT: 26.5 % — AB (ref 36.0–46.0)
HEMOGLOBIN: 9 g/dL — AB (ref 12.0–15.0)
MCH: 31.5 pg (ref 26.0–34.0)
MCHC: 34 g/dL (ref 30.0–36.0)
MCV: 92.7 fL (ref 78.0–100.0)
Platelets: 172 10*3/uL (ref 150–400)
RBC: 2.86 MIL/uL — ABNORMAL LOW (ref 3.87–5.11)
RDW: 15 % (ref 11.5–15.5)
WBC: 5.5 10*3/uL (ref 4.0–10.5)

## 2015-04-17 LAB — PROTIME-INR
INR: 1.36 (ref 0.00–1.49)
Prothrombin Time: 16.9 seconds — ABNORMAL HIGH (ref 11.6–15.2)

## 2015-04-17 LAB — GLUCOSE, CAPILLARY
GLUCOSE-CAPILLARY: 106 mg/dL — AB (ref 65–99)
GLUCOSE-CAPILLARY: 151 mg/dL — AB (ref 65–99)
GLUCOSE-CAPILLARY: 135 mg/dL — AB (ref 65–99)
Glucose-Capillary: 96 mg/dL (ref 65–99)

## 2015-04-17 LAB — PLATELET INHIBITION P2Y12: Platelet Function  P2Y12: 252 [PRU] (ref 194–418)

## 2015-04-17 MED ORDER — WARFARIN SODIUM 5 MG PO TABS
5.0000 mg | ORAL_TABLET | Freq: Once | ORAL | Status: AC
  Administered 2015-04-17: 5 mg via ORAL
  Filled 2015-04-17: qty 1

## 2015-04-17 MED ORDER — CLOPIDOGREL BISULFATE 75 MG PO TABS
75.0000 mg | ORAL_TABLET | Freq: Every day | ORAL | Status: DC
  Administered 2015-04-17 – 2015-04-25 (×9): 75 mg via ORAL
  Filled 2015-04-17 (×9): qty 1

## 2015-04-17 MED ORDER — CLOPIDOGREL BISULFATE 75 MG PO TABS
75.0000 mg | ORAL_TABLET | Freq: Once | ORAL | Status: AC
  Administered 2015-04-17: 75 mg via ORAL
  Filled 2015-04-17: qty 1

## 2015-04-17 NOTE — Progress Notes (Signed)
Admission Note  Patient arrived to floor in room 5W02 via bed, transferred from [facility]. Patient alert and oriented X 4. Vitals signs  Oral temperature 98.3 F       Blood pressure 157/81       Pulse 75       RR 18       SpO2 100 % on room air. Denies pain. Skin intact, no pressure ulcer noted in sacral area. Telemetry monitor placed.  Patient's ID armband verified with patient/ family, and in place. Information packet given to patient/ family. Fall risk assessed, SR up X2, patient/ family able to verbalize understanding of risks associated with falls and to call nurse or staff to assist before getting out of bed. Patient/ family oriented to room and equipment. Call bell within reach.

## 2015-04-17 NOTE — Care Management Important Message (Signed)
Important Message  Patient Details  Name: [name] MRN: [id number] Date of Birth: [birth year]   Medicare Important Message Given:  Yes-second notification given    Nathen May 04/17/2015, 1:05 [place] Message  Patient Details  Name: [name] MRN: [id number] Date of Birth: 08/15/1933   Medicare Important Message Given:  Yes-second notification given    Nathen May 04/17/2015, 1:04 PM

## 2015-04-17 NOTE — Progress Notes (Addendum)
Patient Name: [name] Date of Encounter: 04/17/2015  Principal Problem:   Acute lower GI bleeding/known history AVMs Active Problems:   Chronic anticoagulation   Acute blood loss anemia   S/P AVR (aortic valve replacement)   CAD - last PCI was DES to RCA 04/12/15   DM (diabetes mellitus)   HTN (hypertension)   Hyperlipidemia   Elevated troponin   Acute renal failure superimposed on stage 3 chronic kidney disease   Chronic atrial fibrillation-CHADVASC = 6   GI bleed   Primary Cardiologist: Dr Irish Lack  Patient Profile: 81 yo female w/ hx DM, HTN, HL, PAF and mech AoV on coum, AVMs and hx GIB on coum and anti-plat rx, CAD w/ BMS, DES to the RCA 07/21, admitted 07/24 w/ BRBPR.  SUBJECTIVE: Feels much better today.  OBJECTIVE Filed Vitals:   04/17/15 0011 04/17/15 0423 04/17/15 0644 04/17/15 0730  BP: 102/55 130/44  140/41  Pulse: 60 57  73  Temp: 97.6 F (36.4 C) 97.9 F (36.6 C)  97.7 F (36.5 C)  TempSrc: Oral Oral  Oral  Resp: 15 16  16   Height:      Weight:   141 lb 8.6 oz (64.2 kg)   SpO2: 98% 99%  100%    Intake/Output Summary (Last 24 hours) at 04/17/15 0820 Last data filed at 04/17/15 0736  Gross per 24 hour  Intake   1610 ml  Output   1550 ml  Net     60 ml   Filed Weights   04/15/15 1615 04/16/15 0616 04/17/15 0644  Weight: 137 lb 9.1 oz (62.4 kg) 139 lb 12.4 oz (63.4 kg) 141 lb 8.6 oz (64.2 kg)    PHYSICAL EXAM General: Well developed, well nourished, female in no acute distress. Head: Normocephalic, atraumatic.  Neck: Supple without bruits, JVD not elevated. Lungs:  Resp regular and unlabored, CTA. Heart: RRR, S1, S2, no S3, S4, 2/6 murmur; crisp valve click, no rub. Abdomen: Soft, non-tender, non-distended, BS + x 4.  Extremities: No clubbing, cyanosis, edema.  Neuro: Alert and oriented X 3. Moves all extremities spontaneously. Psych: Normal affect.  LABS: CBC:  Recent Labs  04/15/15 1145 04/16/15 0318 04/17/15 0347  WBC  8.3 7.1 5.5  NEUTROABS 5.8  --   --   HGB 9.9* 9.0* 9.0*  HCT 29.7* 26.8* 26.5*  MCV 93.7 92.1 92.7  PLT 232 168 172   INR:  Recent Labs  04/17/15 0347  INR [id number]   Basic Metabolic Panel:  Recent Labs  04/16/15 0318 04/17/15 0347  NA 135 137  K 3.9 4.0  CL 104 107  CO2 24 24  GLUCOSE 92 100*  BUN 31* 21*  CREATININE 1.84* 1.58*  CALCIUM 8.3* 8.5*   Liver Function Tests:  Recent Labs  04/15/15 1145  AST 30  ALT 20  ALKPHOS 41  BILITOT 1.1  PROT 7.6  ALBUMIN 3.7   Cardiac Enzymes:  Recent Labs  04/15/15 1145  TROPONINI 0.55*    Recent Labs  04/15/15 1203 04/15/15 1421  TROPIPOC 0.54* 0.47*   TELE:   SR  Radiology/Studies: Dg Chest Portable 1 View 04/15/2015   CLINICAL DATA:  81 year old presenting with melena and bright red blood per rectum. Coronary stent placed 1 week ago, and patient placed on anticoagulation with Coumadin and Plavix. Patient complains of generalized weakness and chest pressure worse with exertion. Prior CABG and aortic valve replacement. Prior episodes of GI bleeding related to small bowel AVMs.  EXAM: PORTABLE CHEST - 1 VIEW  COMPARISON:  08/09/2010 and earlier.  FINDINGS: Sternotomy for CABG and aortic valve replacement. Cardiac silhouette upper normal in size for AP portable technique, unchanged. Lungs clear. Bronchovascular markings normal. Pulmonary vascularity normal. No visible pleural effusions. No pneumothorax.  IMPRESSION: Stable borderline heart size.  No acute cardiopulmonary disease.   Electronically Signed   By: Evangeline Dakin M.D.   On: 04/15/2015 12:11     Current Medications:  . Chlorhexidine Gluconate Cloth  6 each Topical Q0600  . digoxin  0.125 mg Oral Daily  . hydrocortisone  25 mg Rectal BID  . insulin aspart  0-9 Units Subcutaneous TID WC  . metoprolol tartrate  12.5 mg Oral BID  . mupirocin ointment  1 application Nasal BID  . pantoprazole  40 mg Oral Q0600  . sodium chloride  3 mL Intravenous Q12H  .  Warfarin - Pharmacist Dosing Inpatient   Does not apply q1800      ASSESSMENT AND PLAN: Principal Problem:   Acute lower GI bleeding/known history AVMs Coumadin has now been restarted and Plavix is on hold. This is a very difficult situation with a mechanical aortic valve and recent coronary intervention with a stent. It was a drug-eluting stent.  Active Problems:   Chronic anticoagulation INR 1.37 on admit, so not supratherapeutic    Acute blood loss anemia H&H 9.0/26.8, s/p 1 u PRBCs Per IM    S/P AVR (aortic valve replacement) Crisp valve click  Otherwise, per IM   CAD - last PCI was DES to RCA 04/12/15   DM (diabetes mellitus)   HTN (hypertension)   Hyperlipidemia   Elevated troponin   Acute renal failure superimposed on stage 3 chronic kidney disease   Chronic atrial fibrillation-CHADVASC = 6   GI bleed   Signed, Barrett, Rhonda , PA-C 8:20 AM 04/17/2015   I have examined the patient and reviewed assessment and plan and discussed with patient.  Agree with above as stated.  Given age of stent, clopidogrel needs to be restarted.  I have written for clopidogrel 75 mg daily.  Coumadin restarted yesterday.  SHe had a BM yesterday without blood in it.  I suspect she is still therapeutic on her Plavix since she only missed a day.  P2Y12 test pending.  I stressed the importance of staying on iron and her PPI.  She will need Plavix daily without interruption at least until the end of October.  HTN: borderline control.  Higher off of valsartan, but renal function is improved in the setting of CRI.  Coumadin for stroke prevention in the setting of AFib as well.   [place] Catarino S.

## 2015-04-17 NOTE — Progress Notes (Signed)
ANTICOAGULATION CONSULT NOTE - Follow Up Consult  Pharmacy Consult for Warfarin Indication: mechanical aortic valve  Allergies  Allergen Reactions  . Darvon [Propoxyphene] Nausea And Vomiting  . Lisinopril Cough  . Septra [Sulfamethoxazole-Trimethoprim]     Increased INR  . Warfarin And Related Other (See Comments)    Generic Warfarin -- pt states it makes her "crazy"  ONLY TOLERATES BRAND  . Penicillins Rash    Patient Measurements: Height: 5' (152.4 cm) Weight: 141 lb 8.6 oz (64.2 kg) IBW/kg (Calculated) : 45.5   Vital Signs: Temp: 97.6 F (36.4 C) (07/26 1234) Temp Source: Oral (07/26 1234) BP: 136/47 mmHg (07/26 1234) Pulse Rate: 67 (07/26 1234)  Labs:  Recent Labs  04/15/15 1145 04/16/15 0318 04/17/15 0347  HGB 9.9* 9.0* 9.0*  HCT 29.7* 26.8* 26.5*  PLT 232 168 172  LABPROT 17.0*  --  16.9*  INR 1.37  --  1.36  CREATININE 2.10* 1.84* 1.58*  TROPONINI 0.55*  --   --     Estimated Creatinine Clearance: 24.2 mL/min (by C-G formula based on Cr of 1.58).   Medications:  Scheduled:  . Chlorhexidine Gluconate Cloth  6 each Topical Q0600  . clopidogrel  75 mg Oral Daily  . digoxin  0.125 mg Oral Daily  . hydrocortisone  25 mg Rectal BID  . insulin aspart  0-9 Units Subcutaneous TID WC  . metoprolol tartrate  12.5 mg Oral BID  . mupirocin ointment  1 application Nasal BID  . pantoprazole  40 mg Oral Q0600  . sodium chloride  3 mL Intravenous Q12H  . Warfarin - Pharmacist Dosing Inpatient   Does not apply q1800   Infusions:   PRN: ondansetron **OR** ondansetron (ZOFRAN) IV, zolpidem  Assessment: 63 YOF with history of mechanical aortic valve. Pharmacy is consulted to dose warfarin for mechanical aortic valve. Pt presented with acute lower GI bleeding.  GI ok to restart warfarin with minor hematochezia x 1 stool. No further s/sx of bleeding noted. INR remains sub-therapeutic 7/26 1.37>1.36 following 5 mg x1, Hgb 9.0, Plt 172.  Will continue 5 mg x1 due to  high risk of bleeding.   PTA Warfarin Dosing: 2.5mg  Sat and 5mg  AODs  Goal of Therapy:  INR 2-3 Monitor platelets by anticoagulation protocol: Yes   Plan:  Warfarin 5 mg x 1 Daily PT/INR Monitor for s/sx of bleeding.     Kem Parkinson, PharmD Pharmacy Resident  04/17/2015,1:44 PM

## 2015-04-17 NOTE — Progress Notes (Signed)
PROGRESS NOTE  [name] [id number] DOB: [birth year] DOA: 04/15/2015 PCP: Simona Huh, MD   Brief history 81 year old female with a history of diabetes mellitus, hypertension, dyslipidemia, atrial fibrillation and mechanical aortic valve replacement on warfarin, recurrent GI bleeding and setting of AVMs, known CAD with previous bare metal stents and recently his charge 7/22 after undergoing placement of DES to the RCA 7/21. She was started on Plavix. Patient presented to the ER with rectal bleeding. Episode was associated with chest tightness and shortness of breath. The patient's hemoglobin was 9.9 at the time of admission. She was transfused 1 unit of PRBC. Cardiology and gastroenterology were consulted. Initially, the patient's warfarin and Plavix were discontinued. However, the patient was restarted on her Coumadin and Plavix on 04/16/2015 with close monitoring.  Assessment/Plan:  Acute lower GI bleeding/known history SB AVMs -Appreciate [place] gastroenterology-->no further interventions at this time -started on Anusol -Restart Plavix and Coumadin 7/25 -In the past, these bleeds have been self-limiting in nature -So far bleeding has been limited to one stool but will monitor for increased bleeding symptoms -Colonoscopy was negative in 01/2013 and enteroscopy showed gastritis in 01/2013 so no plans to repeat either study -7/25, 7/26--no bloody BMs  Chronic blood loss anemia -Symptomatic based on patient's appearance, suboptimal blood pressure and complaints of chest pain and shortness of breath -one unit PRBC on 7/24 -Monitor hemoglobin-->stable    CAD -recent DES to RCA 04/12/15/Elevated troponin -appreciate cardiology-->Plavix x 3 months at minimum -Suspect elevated troponin more reflective of demand ischemia from symptomatic anemia -Because of soft blood pressures will also hold most of her cardiac medications including Lasix and Diovan-->BP gradually  improving -continue very low-dose beta blocker with hold parameters   Chronic anticoagulation/S/P Mechanical AVR -Initial INR 1.37 -Restarted warfarin 04/16/15   DM  -hold Invokana, Amaryl and Januvia in favor of SSI only  -Follow CBGs  -Current glucose well controlled  -Hemoglobin A1c--pending   Chronic atrial fibrillation -Continue beta blocker and digoxin -CHADVASc = 6   Acute renal failure superimposed on stage 3 chronic kidney disease -Mild azotemia likely related to GI bleeding  -Mild elevation in creatinine --2.10 at the time of admission -Hold Lasix and valsartan -Baseline creatinine 1.6-1.8   HTN -Blood pressure currently soft so antihypertensives on hold except for very low-dose Lopressor (with ordered hold parameters) -Echocardiogram 7/19 with normal LV function and mild LVH without mention of definitive diastolic dysfunction  -valsartan and Lasix on hold    Hyperlipidemia -restart lipitor   Family Communication: Husband updated at beside 7/25 Disposition Plan: Transfer to med tele, home 1-2 days  Procedures/Studies: Dg Chest Portable 1 View  04/15/2015   CLINICAL DATA:  81 year old presenting with melena and bright red blood per rectum. Coronary stent placed 1 week ago, and patient placed on anticoagulation with Coumadin and Plavix. Patient complains of generalized weakness and chest pressure worse with exertion. Prior CABG and aortic valve replacement. Prior episodes of GI bleeding related to small bowel AVMs.  EXAM: PORTABLE CHEST - 1 VIEW  COMPARISON:  08/09/2010 and earlier.  FINDINGS: Sternotomy for CABG and aortic valve replacement. Cardiac silhouette upper normal in size for AP portable technique, unchanged. Lungs clear. Bronchovascular markings normal. Pulmonary vascularity normal. No visible pleural effusions. No pneumothorax.  IMPRESSION: Stable borderline heart size.  No acute cardiopulmonary disease.   Electronically Signed   By: Evangeline Dakin M.D.   On: 04/15/2015 12:11  Subjective: Patient denies fevers, chills, headache, chest pain, dyspnea, nausea, vomiting, diarrhea, abdominal pain, dysuria, hematuria. No bloody bowel movements today.   Objective: Filed Vitals:   04/17/15 0730 04/17/15 0953 04/17/15 1234 04/17/15 1633  BP: 140/41 133/46 136/47 126/46  Pulse: 73 74 67 71  Temp: 97.7 F (36.5 C)  97.6 F (36.4 C) 97.9 F (36.6 C)  TempSrc: Oral  Oral Oral  Resp: 16  20 20   Height:      Weight:      SpO2: 100%  100% 100%    Intake/Output Summary (Last 24 hours) at 04/17/15 1702 Last data filed at 04/17/15 1639  Gross per 24 hour  Intake   1060 ml  Output   1850 ml  Net   -790 ml   Weight change: 1.8 kg (3 lb 15.5 oz) Exam:   General:  Pt is alert, follows commands appropriately, not in acute distress  HEENT: No icterus, No thrush, No neck mass, [place]/AT  Cardiovascular: RRR, S1/S2, no rubs, no gallops  Respiratory: CTA bilaterally, no wheezing, no crackles, no rhonchi  Abdomen: Soft/+BS, non tender, non distended, no guarding  Extremities: 1+LE edema, No lymphangitis, No petechiae, No rashes, no synovitis  Data Reviewed: Basic Metabolic Panel:  Recent Labs Lab 04/13/15 0250 04/15/15 1145 04/16/15 0318 04/17/15 0347  NA 136 137 135 137  K 4.0 4.3 3.9 4.0  CL 101 103 104 107  CO2 26 25 24 24   GLUCOSE 116* 188* 92 100*  BUN 23* 35* 31* 21*  CREATININE 1.62* 2.10* 1.84* 1.58*  CALCIUM 8.7* 9.3 8.3* 8.5*   Liver Function Tests:  Recent Labs Lab 04/15/15 1145  AST 30  ALT 20  ALKPHOS 41  BILITOT 1.1  PROT 7.6  ALBUMIN 3.7    Recent Labs Lab 04/15/15 1145  LIPASE 35   No results for input(s): AMMONIA in the last 168 hours. CBC:  Recent Labs Lab 04/13/15 0250 04/15/15 1145 04/16/15 0318 04/17/15 0347  WBC 7.7 8.3 7.1 5.5  NEUTROABS  --  5.8  --   --   HGB 9.1* 9.9* 9.0* 9.0*  HCT 27.4* 29.7* 26.8* 26.5*  MCV 92.3 93.7 92.1 92.7  PLT 184 232 168 172    Cardiac Enzymes:  Recent Labs Lab 04/15/15 1145  TROPONINI 0.55*   BNP: Invalid input(s): POCBNP CBG:  Recent Labs Lab 04/16/15 1244 04/16/15 1652 04/16/15 2000 04/17/15 0729 04/17/15 1239  GLUCAP 155* 136* 143* 106* 96    Recent Results (from the past 240 hour(s))  MRSA PCR Screening     Status: Abnormal   Collection Time: 04/15/15  4:19 PM  Result Value Ref Range Status   MRSA by PCR POSITIVE (A) NEGATIVE Final    Comment:        The GeneXpert MRSA Assay (FDA approved for NASAL specimens only), is one component of a comprehensive MRSA colonization surveillance program. It is not intended to diagnose MRSA infection nor to guide or monitor treatment for MRSA infections. RESULT CALLED TO, READ BACK BY AND VERIFIED WITH: HOOD,T RN 04/15/15 1750 WOOTEN,K      Scheduled Meds: . Chlorhexidine Gluconate Cloth  6 each Topical Q0600  . clopidogrel  75 mg Oral Daily  . digoxin  0.125 mg Oral Daily  . hydrocortisone  25 mg Rectal BID  . insulin aspart  0-9 Units Subcutaneous TID WC  . metoprolol tartrate  12.5 mg Oral BID  . mupirocin ointment  1 application Nasal BID  . pantoprazole  40  mg Oral Q0600  . sodium chloride  3 mL Intravenous Q12H  . warfarin  5 mg Oral ONCE-1800  . Warfarin - Pharmacist Dosing Inpatient   Does not apply q1800   Continuous Infusions:    Abshir Paolini, DO  [facility] Pager [phone]  If 7PM-7AM, please contact night-coverage [email or web address] Password [email or web address] 04/17/2015, 5:02 PM   LOS: 2 days

## 2015-04-18 DIAGNOSIS — E119 Type 2 diabetes mellitus without complications: Secondary | ICD-10-CM

## 2015-04-18 LAB — CBC
HEMATOCRIT: 26.7 % — AB (ref 36.0–46.0)
Hemoglobin: 9 g/dL — ABNORMAL LOW (ref 12.0–15.0)
MCH: 31.3 pg (ref 26.0–34.0)
MCHC: 33.7 g/dL (ref 30.0–36.0)
MCV: 92.7 fL (ref 78.0–100.0)
Platelets: 167 10*3/uL (ref 150–400)
RBC: 2.88 MIL/uL — ABNORMAL LOW (ref 3.87–5.11)
RDW: 14.9 % (ref 11.5–15.5)
WBC: 5.3 10*3/uL (ref 4.0–10.5)

## 2015-04-18 LAB — GLUCOSE, CAPILLARY
Glucose-Capillary: 108 mg/dL — ABNORMAL HIGH (ref 65–99)
Glucose-Capillary: 111 mg/dL — ABNORMAL HIGH (ref 65–99)
Glucose-Capillary: 134 mg/dL — ABNORMAL HIGH (ref 65–99)
Glucose-Capillary: 127 mg/dL — ABNORMAL HIGH (ref 65–99)

## 2015-04-18 LAB — BASIC METABOLIC PANEL
ANION GAP: 5 (ref 5–15)
BUN: 19 mg/dL (ref 6–20)
CHLORIDE: 111 mmol/L (ref 101–111)
CO2: 22 mmol/L (ref 22–32)
Calcium: 8.7 mg/dL — ABNORMAL LOW (ref 8.9–10.3)
Creatinine, Ser: 1.4 mg/dL — ABNORMAL HIGH (ref 0.44–1.00)
GFR calc Af Amer: 40 mL/min — ABNORMAL LOW (ref >60)
GFR calc non Af Amer: 35 mL/min — ABNORMAL LOW (ref >60)
Glucose, Bld: 118 mg/dL — ABNORMAL HIGH (ref 65–99)
POTASSIUM: 4 mmol/L (ref 3.5–5.1)
SODIUM: 138 mmol/L (ref 135–145)

## 2015-04-18 LAB — PLATELET INHIBITION P2Y12: PLATELET FUNCTION P2Y12: 249 [PRU] (ref 194–418)

## 2015-04-18 LAB — HEMOGLOBIN A1C
HEMOGLOBIN A1C: 6.2 % — AB (ref 4.8–5.6)
Mean Plasma Glucose: 131 mg/dL

## 2015-04-18 LAB — PROTIME-INR
INR: 1.35 (ref 0.00–1.49)
PROTHROMBIN TIME: 16.8 s — AB (ref 11.6–15.2)

## 2015-04-18 MED ORDER — WARFARIN SODIUM 7.5 MG PO TABS
7.5000 mg | ORAL_TABLET | Freq: Once | ORAL | Status: AC
  Administered 2015-04-18: 7.5 mg via ORAL
  Filled 2015-04-18: qty 1

## 2015-04-18 MED ORDER — HEPARIN (PORCINE) IN NACL 100-0.45 UNIT/ML-% IJ SOLN
1000.0000 [IU]/h | INTRAMUSCULAR | Status: DC
  Administered 2015-04-18: 750 [IU]/h via INTRAVENOUS
  Filled 2015-04-18 (×3): qty 250

## 2015-04-18 NOTE — Progress Notes (Signed)
PROGRESS NOTE  [name] [id number] DOB: May 17, 1933 DOA: 04/15/2015 PCP: Simona Huh, MD   Brief history 81 year old female with a history of diabetes mellitus, hypertension, dyslipidemia, atrial fibrillation and mechanical aortic valve replacement on warfarin, recurrent GI bleeding and setting of AVMs, known CAD with previous bare metal stents and recently his charge 7/22 after undergoing placement of DES to the RCA 7/21. She was started on Plavix. Patient presented to the ER with rectal bleeding. Episode was associated with chest tightness and shortness of breath. The patient's hemoglobin was 9.9 at the time of admission. She was transfused 1 unit of PRBC. Cardiology and gastroenterology were consulted. Initially, the patient's warfarin and Plavix were discontinued. However, the patient was restarted on her Coumadin and Plavix on 04/16/2015 with close monitoring.  Assessment/Plan:  Acute lower GI bleeding/known history SB AVMs -Appreciate [place] gastroenterology-->no further interventions at this time -started on Anusol due to concerns for hemorrhoidal bleeding -Restarted Plavix and Coumadin 7/25 -In the past, these bleeds have been self-limiting in nature -So far bleeding has been limited to one stool but will monitor for increased bleeding symptoms -Colonoscopy was negative in 01/2013 and enteroscopy showed gastritis in 01/2013 so no plans to repeat either study -7/25, 7/26--no bloody BMs. Some old blood noted today.  Chronic blood loss anemia -Symptomatic based on patient's appearance, suboptimal blood pressure and complaints of chest pain and shortness of breath -one unit PRBC transfused on 7/24 -Monitor hemoglobin-->stable   CAD -recent DES to RCA 04/12/15/Elevated troponin -Cardiology is following closely-->Plavix x 3 months at minimum -Suspect elevated troponin more reflective of demand ischemia from symptomatic anemia -Because of soft blood pressures we are  holding most of her cardiac medications including Lasix and Diovan-->BP gradually improving -continue very low-dose beta blocker with hold parameters  Chronic anticoagulation/S/P Mechanical AVR -Initial INR 1.37 -Restarted warfarin 04/16/15. Heart her on IV heparin for bridging purposes by cardiology today.  Diabetes mellitus type 2 -holding Invokana, Amaryl and Januvia in favor of SSI only  -Follow CBGs  -Current glucose well controlled  -Hemoglobin A1c 6.2  Chronic atrial fibrillation -Continue beta blocker and digoxin -CHADVASc = 6. On warfarin as discussed above.  Acute renal failure superimposed on stage 3 chronic kidney disease -Mild azotemia likely related to GI bleeding. Now improved -Mild elevation in creatinine --2.10 at the time of admission -Holding Lasix and valsartan -Baseline creatinine 1.6-1.8. Creatinine is improved.  Essential HTN -Blood pressure stable -Echocardiogram 7/19 with normal LV function and mild LVH without mention of definitive diastolic dysfunction  -valsartan and Lasix on hold   Hyperlipidemia - lipitor  DVT prophylaxis: On anticoagulation CODE STATUS: Full code Family Communication:Discussed in detail with patient Disposition Plan:Await therapeutic INR  Procedures/Studies: Dg Chest Portable 1 View  04/15/2015   CLINICAL DATA:  81 year old presenting with melena and bright red blood per rectum. Coronary stent placed 1 week ago, and patient placed on anticoagulation with Coumadin and Plavix. Patient complains of generalized weakness and chest pressure worse with exertion. Prior CABG and aortic valve replacement. Prior episodes of GI bleeding related to small bowel AVMs.  EXAM: PORTABLE CHEST - 1 VIEW  COMPARISON:  08/09/2010 and earlier.  FINDINGS: Sternotomy for CABG and aortic valve replacement. Cardiac silhouette upper normal in size for AP portable technique, unchanged. Lungs clear. Bronchovascular markings normal. Pulmonary vascularity  normal. No visible pleural effusions. No pneumothorax.  IMPRESSION: Stable borderline heart size.  No acute cardiopulmonary disease.   Electronically  Signed   By: Evangeline Dakin M.D.   On: 04/15/2015 12:11     Subjective: Patient feels well. Wishes to go home as soon as possible. Denies any further bleeding in her stool, although there was some mention of possibly passing ordered blood this morning. Denies any nausea, vomiting. Denies any lightheadedness or dizziness.    Objective: Filed Vitals:   04/17/15 1234 04/17/15 1633 04/17/15 2029 04/18/15 0558  BP: 136/47 126/46 157/81 129/54  Pulse: 67 71 75 67  Temp: 97.6 F (36.4 C) 97.9 F (36.6 C) 98.3 F (36.8 C) 98 F (36.7 C)  TempSrc: Oral Oral Oral Oral  Resp: 20 20 18 17   Height:   5' (1.524 m)   Weight:   62.2 kg (137 lb 2 oz) 62.3 kg (137 lb 5.6 oz)  SpO2: 100% 100% 100% 99%    Intake/Output Summary (Last 24 hours) at 04/18/15 1103 Last data filed at 04/18/15 0546  Gross per 24 hour  Intake    840 ml  Output    500 ml  Net    340 ml   Weight change: -2 kg (-4 lb 6.6 oz)  Exam:   General:  Pt is alert, not in acute distress  Cardiovascular: RRR, S1/S2, no rubs, no gallops  Respiratory: CTA bilaterally, no wheezing, no crackles, no rhonchi  Abdomen: Soft/+BS, non tender, non distended, no guarding   Data Reviewed: Basic Metabolic Panel:  Recent Labs Lab 04/13/15 0250 04/15/15 1145 04/16/15 0318 04/17/15 0347 04/18/15 0558  NA 136 137 135 137 138  K 4.0 4.3 3.9 4.0 4.0  CL 101 103 104 107 111  CO2 26 25 24 24 22   GLUCOSE 116* 188* 92 100* 118*  BUN 23* 35* 31* 21* 19  CREATININE 1.62* 2.10* 1.84* 1.58* 1.40*  CALCIUM 8.7* 9.3 8.3* 8.5* 8.7*   Liver Function Tests:  Recent Labs Lab 04/15/15 1145  AST 30  ALT 20  ALKPHOS 41  BILITOT 1.1  PROT 7.6  ALBUMIN 3.7    Recent Labs Lab 04/15/15 1145  LIPASE 35   CBC:  Recent Labs Lab 04/13/15 0250 04/15/15 1145 04/16/15 0318  04/17/15 0347 04/18/15 0558  WBC 7.7 8.3 7.1 5.5 5.3  NEUTROABS  --  5.8  --   --   --   HGB 9.1* 9.9* 9.0* 9.0* 9.0*  HCT 27.4* 29.7* 26.8* 26.5* 26.7*  MCV 92.3 93.7 92.1 92.7 92.7  PLT 184 232 168 172 167   Cardiac Enzymes:  Recent Labs Lab 04/15/15 1145  TROPONINI 0.55*   CBG:  Recent Labs Lab 04/17/15 0729 04/17/15 1239 04/17/15 1635 04/17/15 2132 04/18/15 0739  GLUCAP 106* 96 151* 135* 108*    Recent Results (from the past 240 hour(s))  MRSA PCR Screening     Status: Abnormal   Collection Time: 04/15/15  4:19 PM  Result Value Ref Range Status   MRSA by PCR POSITIVE (A) NEGATIVE Final    Comment:        The GeneXpert MRSA Assay (FDA approved for NASAL specimens only), is one component of a comprehensive MRSA colonization surveillance program. It is not intended to diagnose MRSA infection nor to guide or monitor treatment for MRSA infections. RESULT CALLED TO, READ BACK BY AND VERIFIED WITH: HOOD,T RN 04/15/15 1750 WOOTEN,K      Scheduled Meds: . Chlorhexidine Gluconate Cloth  6 each Topical Q0600  . clopidogrel  75 mg Oral Daily  . digoxin  0.125 mg Oral Daily  . hydrocortisone  25 mg Rectal BID  . insulin aspart  0-9 Units Subcutaneous TID WC  . metoprolol tartrate  12.5 mg Oral BID  . mupirocin ointment  1 application Nasal BID  . pantoprazole  40 mg Oral Q0600  . sodium chloride  3 mL Intravenous Q12H  . Warfarin - Pharmacist Dosing Inpatient   Does not apply q1800   Continuous Infusions:    [facility] Pager [phone]  If 7PM-7AM, please contact night-coverage [email or web address] Password [email or web address] 04/18/2015, 11:03 AM   LOS: 3 days

## 2015-04-18 NOTE — Care Management Note (Deleted)
Case Management Note  Patient Details  Name: ANNDREA STOKLEY MRN: [id number] Date of Birth: 1933/05/29  Subjective/Objective:                 Patient with history of psych disorder. From home self care. Per medical record patient overdosed and is going through withdrawal. Psych SW following.   Action/Plan:  Will follow plan from psych for discharge disposition.   Expected Discharge Date:  04/19/15               [facility]  In-House Referral:  Clinical Social Work  Discharge planning Services  CM Consult  Post Acute Care Choice:    Choice offered to:     DME Arranged:    DME Agency:     HH Arranged:    [place] Agency:     Status of Service:  In process, will continue to follow  Medicare Important Message Given:  Yes-second notification given Date Medicare IM Given:    Medicare IM give by:    Date Additional Medicare IM Given:    Additional Medicare Important Message give by:     If discussed at [place] of Stay Meetings, dates discussed:    Additional Comments:  Carles Collet, RN 04/18/2015, 12:20 PM

## 2015-04-18 NOTE — Progress Notes (Signed)
ANTICOAGULATION CONSULT NOTE - Follow Up/Initial Consult  Pharmacy Consult for Warfarin and Heparin Initiation Indication: mechanical aortic valve  Allergies  Allergen Reactions  . Darvon [Propoxyphene] Nausea And Vomiting  . Lisinopril Cough  . Septra [Sulfamethoxazole-Trimethoprim]     Increased INR  . Warfarin And Related Other (See Comments)    Generic Warfarin -- pt states it makes her "crazy"  ONLY TOLERATES BRAND  . Penicillins Rash    Patient Measurements: Height: 5' (152.4 cm) Weight: 137 lb 5.6 oz (62.3 kg) IBW/kg (Calculated) : 45.5 Heparin dosing weight: 58.5  Vital Signs: Temp: 98 F (36.7 C) (07/27 0558) Temp Source: Oral (07/27 0558) BP: 129/54 mmHg (07/27 0558) Pulse Rate: 67 (07/27 0558)  Labs:  Recent Labs  04/15/15 1145 04/16/15 0318 04/17/15 0347 04/18/15 0558  HGB 9.9* 9.0* 9.0* 9.0*  HCT 29.7* 26.8* 26.5* 26.7*  PLT 232 168 172 167  LABPROT 17.0*  --  16.9* 16.8*  INR 1.37  --  1.36 1.35  CREATININE 2.10* 1.84* 1.58* 1.40*  TROPONINI 0.55*  --   --   --     Estimated Creatinine Clearance: 26.9 mL/min (by C-G formula based on Cr of 1.4).   Medications:  Scheduled:  . Chlorhexidine Gluconate Cloth  6 each Topical Q0600  . clopidogrel  75 mg Oral Daily  . digoxin  0.125 mg Oral Daily  . hydrocortisone  25 mg Rectal BID  . insulin aspart  0-9 Units Subcutaneous TID WC  . metoprolol tartrate  12.5 mg Oral BID  . mupirocin ointment  1 application Nasal BID  . pantoprazole  40 mg Oral Q0600  . sodium chloride  3 mL Intravenous Q12H  . Warfarin - Pharmacist Dosing Inpatient   Does not apply q1800   Infusions:   PRN: ondansetron **OR** ondansetron (ZOFRAN) IV, zolpidem  Assessment: 75 YOF with history of mechanical aortic valve. Pt presented with acute lower GI bleeding. S/p stent placement on 7/21, pt was started on plavix following DES placement. Pharmacy initially consulted to dose warfarin for mechanical aortic valve. On 2/27 Cards  MD added consult for heparin bridge dosing with NO bolus and goal level at lower end of range. Hgb 9.0>9.0, Plt wnl. No further s/sx of bleeding noted. INR 1.35 (sub-thera) following 5 mg x1  PTA Warfarin Dosing: 2.5mg  Sat and 5mg  AODs  Goal of Therapy:  INR 2-3  Heparin Level goal: 0.3-0.5 Monitor platelets by anticoagulation protocol: Yes   Plan:  Increase to warfarin 7.5 mg x 1 Start Heparin infusion @ 750u/hr (no bolus) Heparin level in 8 hrs, Daily PT/INR Monitor CBC and  for s/sx of bleeding.   Darl Pikes, PharmD Clinical Pharmacist- Resident Pager: [phone]  04/18/2015,11:31 AM

## 2015-04-18 NOTE — Progress Notes (Signed)
Patient Name: [name] Date of Encounter: 04/18/2015  Principal Problem:   Acute lower GI bleeding/known history AVMs Active Problems:   Chronic anticoagulation   Acute blood loss anemia   S/P AVR (aortic valve replacement)   CAD - last PCI was DES to RCA 04/12/15   DM (diabetes mellitus)   HTN (hypertension)   Hyperlipidemia   Elevated troponin   Acute renal failure superimposed on stage 3 chronic kidney disease   Chronic atrial fibrillation-CHADVASC = 6   GI bleed   Acute kidney injury   Anticoagulated   Troponin level elevated   Primary Cardiologist: Dr Irish Lack  Patient Profile: 81 yo female w/ hx DM, HTN, HL, PAF and mech AoV on coum, AVMs and hx GIB on coum and anti-plat rx, CAD w/ BMS, DES to the RCA 07/21, admitted 07/24 w/ BRBPR.  SUBJECTIVE: Feels much better today. Wants to go home.  OBJECTIVE Filed Vitals:   04/17/15 1234 04/17/15 1633 04/17/15 2029 04/18/15 0558  BP: 136/47 126/46 157/81 129/54  Pulse: 67 71 75 67  Temp: 97.6 F (36.4 C) 97.9 F (36.6 C) 98.3 F (36.8 C) 98 F (36.7 C)  TempSrc: Oral Oral Oral Oral  Resp: 20 20 18 17   Height:   5' (1.524 m)   Weight:   137 lb 2 oz (62.2 kg) 137 lb 5.6 oz (62.3 kg)  SpO2: 100% 100% 100% 99%    Intake/Output Summary (Last 24 hours) at 04/18/15 1101 Last data filed at 04/18/15 0546  Gross per 24 hour  Intake    840 ml  Output    500 ml  Net    340 ml   Filed Weights   04/17/15 0644 04/17/15 2029 04/18/15 0558  Weight: 141 lb 8.6 oz (64.2 kg) 137 lb 2 oz (62.2 kg) 137 lb 5.6 oz (62.3 kg)    PHYSICAL EXAM General: Well developed, well nourished, female in no acute distress. Head: Normocephalic, atraumatic.  Neck: Supple without bruits, JVD not elevated. Lungs:  Resp regular and unlabored, CTA. Heart: RRR, S1, S2, no S3, S4, 2/6 murmur; crisp valve click, no rub. Abdomen: Soft, non-tender, non-distended, BS + x 4.  Extremities: No clubbing, cyanosis, edema.  Neuro: Alert and oriented  X 3. Moves all extremities spontaneously. Psych: Normal affect.  LABS: CBC:  Recent Labs  04/15/15 1145  04/17/15 0347 04/18/15 0558  WBC 8.3  < > 5.5 5.3  NEUTROABS 5.8  --   --   --   HGB 9.9*  < > 9.0* 9.0*  HCT 29.7*  < > 26.5* 26.7*  MCV 93.7  < > 92.7 92.7  PLT 232  < > 172 167  < > = values in this interval not displayed. INR:  Recent Labs  04/18/15 0558  INR [id number]   Basic Metabolic Panel:  Recent Labs  04/17/15 0347 04/18/15 0558  NA 137 138  K 4.0 4.0  CL 107 111  CO2 24 22  GLUCOSE 100* 118*  BUN 21* 19  CREATININE 1.58* 1.40*  CALCIUM 8.5* 8.7*   Liver Function Tests:  Recent Labs  04/15/15 1145  AST 30  ALT 20  ALKPHOS 41  BILITOT 1.1  PROT 7.6  ALBUMIN 3.7   Cardiac Enzymes:  Recent Labs  04/15/15 1145  TROPONINI 0.55*    Recent Labs  04/15/15 1203 04/15/15 1421  TROPIPOC 0.54* 0.47*   TELE:   SR  Radiology/Studies: Dg Chest Portable 1 View 04/15/2015   CLINICAL  DATA:  81 year old presenting with melena and bright red blood per rectum. Coronary stent placed 1 week ago, and patient placed on anticoagulation with Coumadin and Plavix. Patient complains of generalized weakness and chest pressure worse with exertion. Prior CABG and aortic valve replacement. Prior episodes of GI bleeding related to small bowel AVMs.  EXAM: PORTABLE CHEST - 1 VIEW  COMPARISON:  08/09/2010 and earlier.  FINDINGS: Sternotomy for CABG and aortic valve replacement. Cardiac silhouette upper normal in size for AP portable technique, unchanged. Lungs clear. Bronchovascular markings normal. Pulmonary vascularity normal. No visible pleural effusions. No pneumothorax.  IMPRESSION: Stable borderline heart size.  No acute cardiopulmonary disease.   Electronically Signed   By: Evangeline Dakin M.D.   On: 04/15/2015 12:11     Current Medications:  . Chlorhexidine Gluconate Cloth  6 each Topical Q0600  . clopidogrel  75 mg Oral Daily  . digoxin  0.125 mg Oral Daily    . hydrocortisone  25 mg Rectal BID  . insulin aspart  0-9 Units Subcutaneous TID WC  . metoprolol tartrate  12.5 mg Oral BID  . mupirocin ointment  1 application Nasal BID  . pantoprazole  40 mg Oral Q0600  . sodium chloride  3 mL Intravenous Q12H  . Warfarin - Pharmacist Dosing Inpatient   Does not apply q1800      ASSESSMENT AND PLAN: Principal Problem:   Acute lower GI bleeding/known history AVMs Coumadin has now been restarted.  Plavix restarted. This is a very difficult situation with a mechanical aortic valve and recent coronary intervention with a stent. It was a drug-eluting stent and will require 3 months of Plvix wihtout interruption.  P2Y12 assay shows some resistance to Plavix, but given her bleeding issues and that the fact that she has done well Plavix in teh past post PCI, would not change antiplatelet agent to something more potent like Brilinta or Effient.  Active Problems:   Chronic anticoagulation INR still <2 .  She has been off of anticcoagulation for the better part of 7 days now; longer this goes, the higher the risk of valve thrombosis.  Would plan on startnig heparin gtt to help bridge her until Coumadin is therapeutic.    Acute blood loss anemia Hbg 9.0-stable, s/p 1 u PRBCs upon admission Per IM    S/P AVR (aortic valve replacement) Crisp valve click  Otherwise, per IM   CAD - last PCI was DES to RCA 04/12/15   DM (diabetes mellitus)   HTN (hypertension)   Hyperlipidemia   Elevated troponin   Acute renal failure superimposed on stage 3 chronic kidney disease   Chronic atrial fibrillation-CHADVASC = 6   GI bleed   Signed, Ana Liaw S. , 11:01 AM 04/18/2015

## 2015-04-18 NOTE — Progress Notes (Signed)
Utilization Review completed. Kristain Filo RN BSN CM 

## 2015-04-19 ENCOUNTER — Encounter (HOSPITAL_COMMUNITY): Payer: Self-pay | Admitting: Physician Assistant

## 2015-04-19 DIAGNOSIS — K558 Other vascular disorders of intestine: Secondary | ICD-10-CM

## 2015-04-19 DIAGNOSIS — K5520 Angiodysplasia of colon without hemorrhage: Secondary | ICD-10-CM | POA: Insufficient documentation

## 2015-04-19 DIAGNOSIS — K921 Melena: Secondary | ICD-10-CM | POA: Insufficient documentation

## 2015-04-19 DIAGNOSIS — K648 Other hemorrhoids: Secondary | ICD-10-CM | POA: Insufficient documentation

## 2015-04-19 DIAGNOSIS — I482 Chronic atrial fibrillation: Secondary | ICD-10-CM

## 2015-04-19 LAB — PROTIME-INR
INR: 1.4 (ref 0.00–1.49)
PROTHROMBIN TIME: 17.2 s — AB (ref 11.6–15.2)

## 2015-04-19 LAB — GLUCOSE, CAPILLARY
GLUCOSE-CAPILLARY: 144 mg/dL — AB (ref 65–99)
GLUCOSE-CAPILLARY: 103 mg/dL — AB (ref 65–99)
Glucose-Capillary: 133 mg/dL — ABNORMAL HIGH (ref 65–99)
Glucose-Capillary: 118 mg/dL — ABNORMAL HIGH (ref 65–99)

## 2015-04-19 LAB — BASIC METABOLIC PANEL
Anion gap: 5 (ref 5–15)
BUN: 18 mg/dL (ref 6–20)
CO2: 23 mmol/L (ref 22–32)
CREATININE: 1.29 mg/dL — AB (ref 0.44–1.00)
Calcium: 8.7 mg/dL — ABNORMAL LOW (ref 8.9–10.3)
Chloride: 110 mmol/L (ref 101–111)
GFR calc Af Amer: 44 mL/min — ABNORMAL LOW (ref >60)
GFR calc non Af Amer: 38 mL/min — ABNORMAL LOW (ref >60)
GLUCOSE: 122 mg/dL — AB (ref 65–99)
Potassium: 4.1 mmol/L (ref 3.5–5.1)
Sodium: 138 mmol/L (ref 135–145)

## 2015-04-19 LAB — CBC
HCT: 25.9 % — ABNORMAL LOW (ref 36.0–46.0)
HEMATOCRIT: 25.2 % — AB (ref 36.0–46.0)
HEMOGLOBIN: 8.6 g/dL — AB (ref 12.0–15.0)
HEMOGLOBIN: 8.8 g/dL — AB (ref 12.0–15.0)
MCH: 31.4 pg (ref 26.0–34.0)
MCH: 30.7 pg (ref 26.0–34.0)
MCHC: 34.1 g/dL (ref 30.0–36.0)
MCHC: 34 g/dL (ref 30.0–36.0)
MCV: 92 fL (ref 78.0–100.0)
MCV: 90.2 fL (ref 78.0–100.0)
PLATELETS: 171 10*3/uL (ref 150–400)
Platelets: 160 10*3/uL (ref 150–400)
RBC: 2.74 MIL/uL — ABNORMAL LOW (ref 3.87–5.11)
RBC: 2.87 MIL/uL — ABNORMAL LOW (ref 3.87–5.11)
RDW: 14.5 % (ref 11.5–15.5)
RDW: 14.2 % (ref 11.5–15.5)
WBC: 5.4 10*3/uL (ref 4.0–10.5)
WBC: 5.1 10*3/uL (ref 4.0–10.5)

## 2015-04-19 LAB — HEPARIN LEVEL (UNFRACTIONATED)
HEPARIN UNFRACTIONATED: 0.18 [IU]/mL — AB (ref 0.30–0.70)
Heparin Unfractionated: <0.1 IU/mL — ABNORMAL LOW (ref 0.30–0.70)

## 2015-04-19 MED ORDER — HYDROCORTISONE ACETATE 25 MG RE SUPP
25.0000 mg | Freq: Two times a day (BID) | RECTAL | Status: DC
  Administered 2015-04-19 – 2015-04-25 (×13): 25 mg via RECTAL
  Filled 2015-04-19 (×13): qty 1

## 2015-04-19 MED ORDER — WARFARIN SODIUM 7.5 MG PO TABS
7.5000 mg | ORAL_TABLET | Freq: Once | ORAL | Status: AC
  Administered 2015-04-19: 7.5 mg via ORAL
  Filled 2015-04-19: qty 1

## 2015-04-19 NOTE — Progress Notes (Signed)
PT Cancellation Note  Patient Details Name: [name] MRN: [id number] DOB: 01-16-1933   Cancelled Treatment:    Reason Eval/Treat Not Completed: PT screened, no needs identified, will sign off   Roanna Epley, SPT [phone]  04/19/2015, 3:39 PM

## 2015-04-19 NOTE — Progress Notes (Signed)
ANTICOAGULATION CONSULT NOTE - Follow Up Consult  Pharmacy Consult for Warfarin and Heparin Initiation Indication: mechanical aortic valve  Allergies  Allergen Reactions  . Darvon [Propoxyphene] Nausea And Vomiting  . Lisinopril Cough  . Septra [Sulfamethoxazole-Trimethoprim]     Increased INR  . Warfarin And Related Other (See Comments)    Generic Warfarin -- pt states it makes her "crazy"  ONLY TOLERATES BRAND  . Penicillins Rash    Patient Measurements: Height: 5' (152.4 cm) Weight: 137 lb 12.6 oz (62.5 kg) IBW/kg (Calculated) : 45.5 Heparin dosing weight: 58.5  Vital Signs: Temp: 98 F (36.7 C) (07/28 0630) Temp Source: Oral (07/28 0630) BP: 144/46 mmHg (07/28 0630) Pulse Rate: 67 (07/28 0630)  Labs:  Recent Labs  04/17/15 0347 04/18/15 0558 04/19/15 0506  HGB 9.0* 9.0* 8.6*  HCT 26.5* 26.7* 25.2*  PLT 172 167 160  LABPROT 16.9* 16.8* 17.2*  INR 1.36 1.35 1.40  HEPARINUNFRC  --   --  0.18*  CREATININE 1.58* 1.40*  --     Estimated Creatinine Clearance: 26.9 mL/min (by C-G formula based on Cr of 1.4).   Assessment: 85 YOF on coumadin PTA for history of mechanical aortic valve. Pt presented with acute lower GI bleeding. S/p stent placement on 7/21, pt was started on plavix following DES placement. Pharmacy initially consulted to dose warfarin for mechanical aortic valve. On 7/27 Cards MD added consult for heparin bridge dosing with NO bolus and goal level at lower end of range. Heparin level 0.18 (subtherapeutic) on 750 units/hr. No issues with line or bleeding per RN. Hgb low but stable. Plt wnl. INR 1.4 (subtherapeutic) - no real movement past coumadin restart.  PTA Warfarin Dosing: 2.5mg  Sat and 5mg  AODs  Goal of Therapy:  INR 2-3  Heparin Level goal: 0.3-0.5 Monitor platelets by anticoagulation protocol: Yes   Plan:  Coumadin 7.5 mg again tonight Increase heparin gtt to 900 units/hr Heparin level in 8 hrs, Daily PT/INR Monitor CBC and  for s/sx of  bleeding.   Sherlon Handing, PharmD, BCPS Clinical pharmacist, pager [phone] 04/19/2015,6:49 AM

## 2015-04-19 NOTE — Care Management Note (Signed)
Case Management Note  Patient Details  Name: [name] MRN: [id number] Date of Birth: May 23, 1933  Subjective/Objective:                 Patient from home, lives with husband. Independent, self care. Patient states that she has a walker she does not use at home. No other needs identified.    Action/Plan:  Will continue to follow.   Expected Discharge Date:  04/19/15               Expected Discharge Plan:  [place]  In-House Referral:  Clinical Social Work  Discharge planning Services  CM Consult  Post Acute Care Choice:    Choice offered to:     DME Arranged:    DME Agency:     HH Arranged:    [place] Agency:     Status of Service:  In process, will continue to follow  Medicare Important Message Given:  Yes-third notification given Date Medicare IM Given:    Medicare IM give by:    Date Additional Medicare IM Given:    Additional Medicare Important Message give by:     If discussed at [place] of Stay Meetings, dates discussed:    Additional Comments:  Carles Collet, RN 04/19/2015, 2:51 PM

## 2015-04-19 NOTE — Progress Notes (Signed)
PROGRESS NOTE  [name] [id number] DOB: 1933/04/08 DOA: 04/15/2015 PCP: Simona Huh, MD   Brief history 81 year old female with a history of diabetes mellitus, hypertension, dyslipidemia, atrial fibrillation and mechanical aortic valve replacement on warfarin, recurrent GI bleeding and setting of AVMs, known CAD with previous bare metal stents and recently his charge 7/22 after undergoing placement of DES to the RCA 7/21. She was started on Plavix. Patient presented to the ER with rectal bleeding. Episode was associated with chest tightness and shortness of breath. The patient's hemoglobin was 9.9 at the time of admission. She was transfused 1 unit of PRBC. Cardiology and gastroenterology were consulted. Initially, the patient's warfarin and Plavix were discontinued. However, the patient was restarted on her Coumadin and Plavix on 04/16/2015 with close monitoring. She had another episode of rectal bleeding on 7/28.  Assessment/Plan:  Acute lower GI bleeding/known history SB AVMs -Recurrence of rectal bleeding this morning. Gastroenterology has been notified.  -Patient was previously started on Anusol due to concerns for hemorrhoidal bleeding -Restarted Plavix and Coumadin 7/25 -In the past, these bleeds have been self-limiting in nature -Colonoscopy was negative in 01/2013 and enteroscopy showed gastritis in 01/2013. GI to decide on endoscopy.  Chronic blood loss anemia -Symptomatic based on patient's appearance, suboptimal blood pressure and complaints of chest pain and shortness of breath -one unit PRBC transfused on 7/24 -Repeat hemoglobin this evening. Transfuse if it drops further.   CAD -recent DES to RCA 04/12/15/Elevated troponin -Cardiology is following closely-->Plavix x 3 months at minimum -Suspect elevated troponin more reflective of demand ischemia from symptomatic anemia -Because of soft blood pressures we are holding most of her cardiac medications  including Lasix and Diovan-->BP gradually improving -continue very low-dose beta blocker with hold parameters  Chronic anticoagulation/S/P Mechanical AVR -Restarted warfarin 04/16/15. Started on IV heparin for bridging purposes by cardiology. Continue heparin for now. If she has more bleeding we may have to discontinue.  Diabetes mellitus type 2 -holding Invokana, Amaryl and Januvia in favor of SSI only  -Follow CBGs  -Current glucose well controlled  -Hemoglobin A1c 6.2  Chronic atrial fibrillation -Continue beta blocker and digoxin -CHADVASc = 6. On warfarin as discussed above.  Acute renal failure superimposed on stage 3 chronic kidney disease -Mild azotemia likely related to GI bleeding. Now improved -Mild elevation in creatinine --2.10 at the time of admission -Holding Lasix and valsartan -Baseline creatinine 1.6-1.8. Creatinine is improved.  Essential HTN -Blood pressure stable -Echocardiogram 7/19 with normal LV function and mild LVH without mention of definitive diastolic dysfunction  -valsartan and Lasix on hold   Hyperlipidemia - lipitor  DVT prophylaxis: On anticoagulation CODE STATUS: Full code Family Communication:Discussed with patient Disposition Plan:Await GI input considering recurrence of bleeding. Repeat hemoglobin this evening.  Procedures/Studies: Dg Chest Portable 1 View  04/15/2015   CLINICAL DATA:  81 year old presenting with melena and bright red blood per rectum. Coronary stent placed 1 week ago, and patient placed on anticoagulation with Coumadin and Plavix. Patient complains of generalized weakness and chest pressure worse with exertion. Prior CABG and aortic valve replacement. Prior episodes of GI bleeding related to small bowel AVMs.  EXAM: PORTABLE CHEST - 1 VIEW  COMPARISON:  08/09/2010 and earlier.  FINDINGS: Sternotomy for CABG and aortic valve replacement. Cardiac silhouette upper normal in size for AP portable technique, unchanged. Lungs  clear. Bronchovascular markings normal. Pulmonary vascularity normal. No visible pleural effusions. No pneumothorax.  IMPRESSION: Stable borderline  heart size.  No acute cardiopulmonary disease.   Electronically Signed   By: Evangeline Dakin M.D.   On: 04/15/2015 12:11     Subjective: Patient denies any dizziness or lightheadedness. No abdominal pain, nausea or vomiting. Did have an episode of bleeding this morning.    Objective: Filed Vitals:   04/18/15 0558 04/18/15 1455 04/18/15 2150 04/19/15 0630  BP: 129/54 156/50 165/69 144/46  Pulse: 67 71 75 67  Temp: 98 F (36.7 C) 97.6 F (36.4 C) 98.6 F (37 C) 98 F (36.7 C)  TempSrc: Oral Oral  Oral  Resp: 17 14  20   Height:      Weight: 62.3 kg (137 lb 5.6 oz)   62.5 kg (137 lb 12.6 oz)  SpO2: 99% 100% 99% 99%    Intake/Output Summary (Last 24 hours) at 04/19/15 1019 Last data filed at 04/18/15 1449  Gross per 24 hour  Intake    220 ml  Output      0 ml  Net    220 ml   Weight change: 0.3 kg (10.6 oz)  Exam:   General:  Pt is alert, not in acute distress  Cardiovascular: RRR, S1/S2, no rubs, no gallops  Respiratory: CTA bilaterally, no wheezing, no crackles, no rhonchi.  Abdomen: Soft/+BS, non tender, non distended, no guarding   Data Reviewed: Basic Metabolic Panel:  Recent Labs Lab 04/15/15 1145 04/16/15 0318 04/17/15 0347 04/18/15 0558 04/19/15 0506  NA 137 135 137 138 138  K 4.3 3.9 4.0 4.0 4.1  CL 103 104 107 111 110  CO2 25 24 24 22 23   GLUCOSE 188* 92 100* 118* 122*  BUN 35* 31* 21* 19 18  CREATININE 2.10* 1.84* 1.58* 1.40* 1.29*  CALCIUM 9.3 8.3* 8.5* 8.7* 8.7*   Liver Function Tests:  Recent Labs Lab 04/15/15 1145  AST 30  ALT 20  ALKPHOS 41  BILITOT 1.1  PROT 7.6  ALBUMIN 3.7    Recent Labs Lab 04/15/15 1145  LIPASE 35   CBC:  Recent Labs Lab 04/15/15 1145 04/16/15 0318 04/17/15 0347 04/18/15 0558 04/19/15 0506  WBC 8.3 7.1 5.5 5.3 5.4  NEUTROABS 5.8  --   --   --    --   HGB 9.9* 9.0* 9.0* 9.0* 8.6*  HCT 29.7* 26.8* 26.5* 26.7* 25.2*  MCV 93.7 92.1 92.7 92.7 92.0  PLT 232 168 172 167 160   Cardiac Enzymes:  Recent Labs Lab 04/15/15 1145  TROPONINI 0.55*   CBG:  Recent Labs Lab 04/18/15 0739 04/18/15 1202 04/18/15 1650 04/18/15 2151 04/19/15 0803  GLUCAP 108* 111* 134* 127* 133*    Recent Results (from the past 240 hour(s))  MRSA PCR Screening     Status: Abnormal   Collection Time: 04/15/15  4:19 PM  Result Value Ref Range Status   MRSA by PCR POSITIVE (A) NEGATIVE Final    Comment:        The GeneXpert MRSA Assay (FDA approved for NASAL specimens only), is one component of a comprehensive MRSA colonization surveillance program. It is not intended to diagnose MRSA infection nor to guide or monitor treatment for MRSA infections. RESULT CALLED TO, READ BACK BY AND VERIFIED WITH: HOOD,T RN 04/15/15 1750 WOOTEN,K      Scheduled Meds: . Chlorhexidine Gluconate Cloth  6 each Topical Q0600  . clopidogrel  75 mg Oral Daily  . digoxin  0.125 mg Oral Daily  . insulin aspart  0-9 Units Subcutaneous TID WC  . metoprolol tartrate  12.5 mg Oral BID  . mupirocin ointment  1 application Nasal BID  . pantoprazole  40 mg Oral Q0600  . sodium chloride  3 mL Intravenous Q12H  . warfarin  7.5 mg Oral ONCE-1800  . Warfarin - Pharmacist Dosing Inpatient   Does not apply q1800   Continuous Infusions: . heparin 900 Units/hr (04/19/15 0714)     [place] [facility] Pager [phone]  If 7PM-7AM, please contact night-coverage [email or web address] Password [email or web address] 04/19/2015, 10:19 AM   LOS: 4 days

## 2015-04-19 NOTE — Progress Notes (Signed)
  Occupational Therapy Evaluation Patient Details Name: [name] MRN: [id number] DOB: 07/27/1933 Today's Date: 04/19/2015    History of Present Illness 81 year old female with a history of diabetes mellitus, hypertension, dyslipidemia, atrial fibrillation and mechanical aortic valve replacement on warfarin, recurrent GI bleeding. Pt readmitted 7/24 with GI bleed.    Clinical Impression   Patient admitted with above. Patient independent PTA and going to gym 3X per week. Patient currently functioning at an overall independent level. D/C from acute OT services and no additional follow-up OT needs at this time. All appropriate education provided to patient. Please re-order OT if needed.      Follow Up Recommendations  No OT follow up;Supervision - Intermittent    Equipment Recommendations  None recommended by OT    Recommendations for Other Services  None at this time   Precautions / Restrictions Precautions Precautions: None Restrictions Weight Bearing Restrictions: No    Mobility Bed Mobility General bed mobility comments: Pt found seated in recliner  Transfers Overall transfer level: Independent General transfer comment: No cues or physical assistane needed for sit<>stand from recliner OR from standard toilet seat    Balance Overall balance assessment: Independent;No apparent balance deficits (not formally assessed)     ADL Overall ADL's : Independent;At baseline General ADL Comments: No LOB/unsteadiness on feet. Pt able to independently ambulate around room pushing IV pole.     Pertinent Vitals/Pain Pain Assessment: No/denies pain     Hand Dominance Right   Extremity/Trunk Assessment Upper Extremity Assessment Upper Extremity Assessment: Overall WFL for tasks assessed   Lower Extremity Assessment Lower Extremity Assessment: Defer to PT evaluation   Cervical / Trunk Assessment Cervical / Trunk Assessment: Normal   Communication  Communication Communication: No difficulties   Cognition Arousal/Alertness: Awake/alert Behavior During Therapy: WFL for tasks assessed/performed Overall Cognitive Status: Within Functional Limits for tasks assessed              Home Living Family/patient expects to be discharged to:: Private residence Living Arrangements: Spouse/significant other Available Help at Discharge: Family;Available 24 hours/day Type of Home: House Home Access: Stairs to enter [employer] of Steps: 2 steps into garage Entrance Stairs-Rails: None Home Layout: One level     Bathroom Shower/Tub: Walk-in shower;Door   [employer] Toilet: Standard     Home Equipment: Environmental consultant - 2 wheels   Prior Functioning/Environment Level of Independence: Independent      OT Diagnosis: Generalized weakness   OT Problem List:  n/a, no acute OT needs identified    OT Treatment/Interventions:    n/a, no acute OT needs identified    OT Goals(Current goals can be found in the care plan section) Acute Rehab OT Goals Patient Stated Goal: none stated OT Goal Formulation: All assessment and education complete, DC therapy  OT Frequency:   n/a, no acute OT needs identified    Barriers to D/C:  None known at this time    End of Session    Activity Tolerance: Patient tolerated treatment well Patient left: in chair;with call bell/phone within reach   Time: 1007-1019 OT Time Calculation (min): 12 min Charges:  OT General Charges $OT Visit: 1 Procedure OT Evaluation $Initial OT Evaluation Tier I: 1 Procedure  Cameren Earnest , MS, OTR/L, CLT Pager: [phone]  04/19/2015, 10:26 AM

## 2015-04-19 NOTE — Progress Notes (Signed)
SUBJECTIVE:  No chest pain. Did have more blood in her BM this AM.  OBJECTIVE:   Vitals:   Filed Vitals:   04/18/15 1455 04/18/15 2150 04/19/15 0630 04/19/15 1041  BP: 156/50 165/69 144/46 146/53  Pulse: 71 75 67 77  Temp: 97.6 F (36.4 C) 98.6 F (37 C) 98 F (36.7 C)   TempSrc: Oral  Oral   Resp: 14  20   Height:      Weight:   137 lb 12.6 oz (62.5 kg)   SpO2: 100% 99% 99%    I&O's:   Intake/Output Summary (Last 24 hours) at 04/19/15 1052 Last data filed at 04/18/15 1449  Gross per 24 hour  Intake    220 ml  Output      0 ml  Net    220 ml   TELEMETRY: Reviewed telemetry pt in NSR:     PHYSICAL EXAM General: Well developed, well nourished, in no acute distress Head:   Normal cephalic and atramatic  Lungs:   Clear bilaterally to auscultation. Heart:   HRRR S1 S2  No JVD.   Abdomen: abdomen soft and non-tender Msk:  Back normal,  Normal strength and tone for age. Extremities:   No edema.   Neuro: Alert and oriented. Psych:  Normal affect, responds appropriately Skin: No rash   LABS: Basic Metabolic Panel:  Recent Labs  04/18/15 0558 04/19/15 0506  NA 138 138  K 4.0 4.1  CL 111 110  CO2 22 23  GLUCOSE 118* 122*  BUN 19 18  CREATININE 1.40* 1.29*  CALCIUM 8.7* 8.7*   Liver Function Tests: No results for input(s): AST, ALT, ALKPHOS, BILITOT, PROT, ALBUMIN in the last 72 hours. No results for input(s): LIPASE, AMYLASE in the last 72 hours. CBC:  Recent Labs  04/18/15 0558 04/19/15 0506  WBC 5.3 5.4  HGB 9.0* 8.6*  HCT 26.7* 25.2*  MCV 92.7 92.0  PLT 167 160   Cardiac Enzymes: No results for input(s): CKTOTAL, CKMB, CKMBINDEX, TROPONINI in the last 72 hours. BNP: Invalid input(s): POCBNP D-Dimer: No results for input(s): DDIMER in the last 72 hours. Hemoglobin A1C:  Recent Labs  04/17/15 0347  HGBA1C 6.2*   Fasting Lipid Panel: No results for input(s): CHOL, HDL, LDLCALC, TRIG, CHOLHDL, LDLDIRECT in the last 72 hours. Thyroid  Function Tests: No results for input(s): TSH, T4TOTAL, T3FREE, THYROIDAB in the last 72 hours.  Invalid input(s): FREET3 Anemia Panel: No results for input(s): VITAMINB12, FOLATE, FERRITIN, TIBC, IRON, RETICCTPCT in the last 72 hours. Coag Panel:   Lab Results  Component Value Date   INR 1.40 04/19/2015   INR 1.35 04/18/2015   INR 1.36 04/17/2015    RADIOLOGY: Dg Chest Portable 1 View  04/15/2015   CLINICAL DATA:  81 year old presenting with melena and bright red blood per rectum. Coronary stent placed 1 week ago, and patient placed on anticoagulation with Coumadin and Plavix. Patient complains of generalized weakness and chest pressure worse with exertion. Prior CABG and aortic valve replacement. Prior episodes of GI bleeding related to small bowel AVMs.  EXAM: PORTABLE CHEST - 1 VIEW  COMPARISON:  08/09/2010 and earlier.  FINDINGS: Sternotomy for CABG and aortic valve replacement. Cardiac silhouette upper normal in size for AP portable technique, unchanged. Lungs clear. Bronchovascular markings normal. Pulmonary vascularity normal. No visible pleural effusions. No pneumothorax.  IMPRESSION: Stable borderline heart size.  No acute cardiopulmonary disease.   Electronically Signed   By: Evangeline Dakin M.D.   On:  04/15/2015 12:11      ASSESSMENT: Kathyrn Lass:   1) CAD: s/p PCI to RCA with Synergy DES>  COntinue Plavix at this time.    2) AVR: COumadin subtherapeutic.  IV heparin at this time. Heparin level low.  INR target 2.0-3.0.  Ideally, would stay at the lower end of the range.   3) Anemia: May need transfusion if Hbg drops more.  Jettie Booze, MD  04/19/2015  10:52 AM

## 2015-04-19 NOTE — Care Management Important Message (Signed)
Important Message  Patient Details  Name: [name] MRN: [id number] Date of Birth: 14-Nov-1932   Medicare Important Message Given:  Yes-third notification given    Nathen May 04/19/2015, 1:00 [place] Message  Patient Details  Name: [name] MRN: [id number] Date of Birth: [birth year]   Medicare Important Message Given:  Yes-third notification given    Nathen May 04/19/2015, 1:00 PM

## 2015-04-19 NOTE — Progress Notes (Signed)
ANTICOAGULATION CONSULT NOTE - Follow Up Consult  Pharmacy Consult for Warfarin and Heparin Initiation Indication: mechanical aortic valve  Allergies  Allergen Reactions  . Darvon [Propoxyphene] Nausea And Vomiting  . Lisinopril Cough  . Septra [Sulfamethoxazole-Trimethoprim]     Increased INR  . Warfarin And Related Other (See Comments)    Generic Warfarin -- pt states it makes her "crazy"  ONLY TOLERATES BRAND  . Penicillins Rash    Patient Measurements: Height: 5' (152.4 cm) Weight: 137 lb 12.6 oz (62.5 kg) IBW/kg (Calculated) : 45.5 Heparin dosing weight: 58.5  Vital Signs: Temp: 98.4 F (36.9 C) (07/28 1528) Temp Source: Oral (07/28 1528) BP: 158/49 mmHg (07/28 1528) Pulse Rate: 75 (07/28 1528)  Labs:  Recent Labs  04/17/15 0347 04/18/15 0558 04/19/15 0506 04/19/15 1720  HGB 9.0* 9.0* 8.6* 8.8*  HCT 26.5* 26.7* 25.2* 25.9*  PLT 172 167 160 171  LABPROT 16.9* 16.8* 17.2*  --   INR 1.36 1.35 1.40  --   HEPARINUNFRC  --   --  0.18* <0.10*  CREATININE 1.58* 1.40* 1.29*  --     Estimated Creatinine Clearance: 29.2 mL/min (by C-G formula based on Cr of 1.29).   Assessment: 36 YOF on coumadin PTA for history of mechanical aortic valve. Pt presented with acute lower GI bleeding. S/p stent placement on 7/21, pt was started on plavix following DES placement. Pharmacy initially consulted to dose warfarin for mechanical aortic valve. On 7/27 Cards MD added consult for heparin bridge dosing with NO bolus and goal level at lower end of range. Heparin level 0.18 (subtherapeutic) on 750 units/hr. No issues with line or bleeding per RN. Hgb low but stable. Plt wnl. INR 1.4 (subtherapeutic) - no real movement past coumadin restart.  PTA Warfarin Dosing: 2.5mg  Sat and 5mg  AODs  F/u HL is undetectable on heparin 900 units/hr. Nurse reports no issues with bleeding but that the pump had been turned off for a short period. Will increase conservatively with this in mind.  Goal  of Therapy:  INR 2-3  Heparin Level goal: 0.3-0.5 Monitor platelets by anticoagulation protocol: Yes   Plan:  Increase heparin to 1000 units/hr Heparin level in 8 hrs, Daily PT/INR Coumadin 7.5 mg again tonight Monitor CBC and  for s/sx of bleeding.   Andrey Cota. Diona Foley, PharmD Clinical Pharmacist Pager [phone] 04/19/2015,6:31 PM

## 2015-04-19 NOTE — Progress Notes (Signed)
Daily Rounding Note  04/19/2015, 11:09 AM  LOS: 4 days   SUBJECTIVE:       Had moderate amount of red blood with otherwise brown, loose stool this AM.  Stool x 1 yesterday was loose/soft brown.  Received 3 day course of Anusol HX 7/25 - 7/27.   Not dizzy, lightheaded and no abdominal pain or rectal pain.  Hgb 8.6 pre rectal bleeding this AM was 8.6, had been 9.0 for previous 3 days.   Studies for hematochezia, anemia, FOBT + as follows:  01/2014 Enteroscopy: antral gastritis.  Erosions in prepyloric antrum (not bleeding).  No AVMs found.  01/2013 Colonoscopy: for hematochezia.  Small internal hemorrhoids.  No diverticula.  Fresh blood throughout colon in lumen and coating bowel wall made for somewhat limited exam.  08/2005 Capsule Endo: multiple AVMs scattered throughout SB. Small abount blood in cecum.   2005 EGD by [facility]: antral erosions.  2005 colonoscopy: by [facility]: small rectal polyp, int rrhoids.   OBJECTIVE:         Vital signs in last 24 hours:    Temp:  [97.6 F (36.4 C)-98.6 F (37 C)] 98 F (36.7 C) (07/28 0630) Pulse Rate:  [67-77] 77 (07/28 1041) Resp:  [14-20] 20 (07/28 0630) BP: (144-165)/(46-69) 146/53 mmHg (07/28 1041) SpO2:  [99 %-100 %] 99 % (07/28 0630) Weight:  [137 lb 12.6 oz (62.5 kg)] 137 lb 12.6 oz (62.5 kg) (07/28 0630) Last BM Date: 04/18/15 Filed Weights   04/17/15 2029 04/18/15 0558 04/19/15 0630  Weight: 137 lb 2 oz (62.2 kg) 137 lb 5.6 oz (62.3 kg) 137 lb 12.6 oz (62.5 kg)   General: looks well.  nad   Heart: RRR Chest: clear bil Abdomen: soft , active BS, ND, NT  Extremities: no CCE Neuro/Psych:  Alert, relaxed.  Oriented x 3.    Intake/Output from previous day: 07/27 0701 - 07/28 0700 In: 580 [P.O.:580] Out: -   Intake/Output this shift:    Lab Results:  Recent Labs  04/17/15 0347 04/18/15 0558 04/19/15 0506  WBC 5.5 5.3 5.4  HGB 9.0* 9.0* 8.6*  HCT 26.5* 26.7* 25.2*    PLT 172 167 160   BMET  Recent Labs  04/17/15 0347 04/18/15 0558 04/19/15 0506  NA 137 138 138  K 4.0 4.0 4.1  CL 107 111 110  CO2 24 22 23   GLUCOSE 100* 118* 122*  BUN 21* 19 18  CREATININE 1.58* 1.40* 1.29*  CALCIUM 8.5* 8.7* 8.7*   LFT No results for input(s): PROT, ALBUMIN, AST, ALT, ALKPHOS, BILITOT, BILIDIR, IBILI in the last 72 hours. PT/INR  Recent Labs  04/18/15 0558 04/19/15 0506  LABPROT 16.8* 17.2*  INR 1.35 1.40   Hepatitis Panel No results for input(s): HEPBSAG, HCVAB, HEPAIGM, HEPBIGM in the last 72 hours.  Studies/Results: No results found.   Scheduled Meds: . Chlorhexidine Gluconate Cloth  6 each Topical Q0600  . clopidogrel  75 mg Oral Daily  . digoxin  0.125 mg Oral Daily  . insulin aspart  0-9 Units Subcutaneous TID WC  . metoprolol tartrate  12.5 mg Oral BID  . mupirocin ointment  1 application Nasal BID  . pantoprazole  40 mg Oral Q0600  . sodium chloride  3 mL Intravenous Q12H  . warfarin  7.5 mg Oral ONCE-1800  . Warfarin - Pharmacist Dosing Inpatient   Does not apply q1800   Continuous Infusions: . heparin 900 Units/hr (04/19/15 0714)   PRN  Meds:.ondansetron **OR** ondansetron (ZOFRAN) IV, zolpidem  ASSESMENT:   *  Hematochezia.  Hx of same.  Now complicated by recent addition of Plavix to chronic Coumadin (subtherapeutic INR so on IV Heparin) Known internal hemorrhoids but also SB AVMs on capsule endo.    *  Chronic Coumadin for mechanical AVR, PAF.   *  S/p PCI/cardiac DES stent  7/21, started Plavix after this.    PLAN   *  Per Dr Fuller Plan.    Azucena Freed  04/19/2015, 11:09 AM Pager: [phone]     Attending physician's note   I have taken an interval history, reviewed the chart and examined the patient. I agree with the Advanced Practitioner's note, impression and recommendations. Recurrent small volume bright red blood per rectum with bowel movements. Pt is on Plavix, Coumadin and IV heparin. Although she has SB  AVMs her current BRBPR is likely from known internal hemorrhoids. Ideally we would stop anticoagulants and antiplatelet medications to help with bleeding but this isn't feasible. Continue Anusol supp bid and observe. If hemorrhoids persistently bleeding surgical mgmt would be next step.  Pricilla Riffle. Fuller Plan, MD [facility] [phone] pager Mon-Fri 8a-5p [phone] weekends, holidays and 5p-8a or per [facility]

## 2015-04-20 ENCOUNTER — Encounter: Payer: Medicare Other | Admitting: Nurse Practitioner

## 2015-04-20 LAB — BASIC METABOLIC PANEL
Anion gap: 7 (ref 5–15)
BUN: 13 mg/dL (ref 6–20)
CO2: 23 mmol/L (ref 22–32)
Calcium: 8.8 mg/dL — ABNORMAL LOW (ref 8.9–10.3)
Chloride: 108 mmol/L (ref 101–111)
Creatinine, Ser: 1.17 mg/dL — ABNORMAL HIGH (ref 0.44–1.00)
GFR, EST AFRICAN AMERICAN: 50 mL/min — AB (ref >60)
GFR, EST NON AFRICAN AMERICAN: 43 mL/min — AB (ref >60)
Glucose, Bld: 129 mg/dL — ABNORMAL HIGH (ref 65–99)
Potassium: 4.2 mmol/L (ref 3.5–5.1)
SODIUM: 138 mmol/L (ref 135–145)

## 2015-04-20 LAB — CBC
HCT: 25.2 % — ABNORMAL LOW (ref 36.0–46.0)
HEMATOCRIT: 28.4 % — AB (ref 36.0–46.0)
HEMOGLOBIN: 8.5 g/dL — AB (ref 12.0–15.0)
Hemoglobin: 9.5 g/dL — ABNORMAL LOW (ref 12.0–15.0)
MCH: 30.9 pg (ref 26.0–34.0)
MCH: 30.8 pg (ref 26.0–34.0)
MCHC: 33.7 g/dL (ref 30.0–36.0)
MCHC: 33.5 g/dL (ref 30.0–36.0)
MCV: 91.6 fL (ref 78.0–100.0)
MCV: 92.2 fL (ref 78.0–100.0)
PLATELETS: 209 10*3/uL (ref 150–400)
Platelets: 179 10*3/uL (ref 150–400)
RBC: 2.75 MIL/uL — ABNORMAL LOW (ref 3.87–5.11)
RBC: 3.08 MIL/uL — ABNORMAL LOW (ref 3.87–5.11)
RDW: 14.5 % (ref 11.5–15.5)
RDW: 14.5 % (ref 11.5–15.5)
WBC: 5.3 10*3/uL (ref 4.0–10.5)
WBC: 6.8 10*3/uL (ref 4.0–10.5)

## 2015-04-20 LAB — HEPARIN LEVEL (UNFRACTIONATED)
HEPARIN UNFRACTIONATED: 0.41 [IU]/mL (ref 0.30–0.70)
Heparin Unfractionated: 0.53 IU/mL (ref 0.30–0.70)

## 2015-04-20 LAB — GLUCOSE, CAPILLARY
GLUCOSE-CAPILLARY: 133 mg/dL — AB (ref 65–99)
GLUCOSE-CAPILLARY: 106 mg/dL — AB (ref 65–99)
Glucose-Capillary: 159 mg/dL — ABNORMAL HIGH (ref 65–99)
Glucose-Capillary: 133 mg/dL — ABNORMAL HIGH (ref 65–99)

## 2015-04-20 LAB — PROTIME-INR
INR: 1.6 — AB (ref 0.00–1.49)
PROTHROMBIN TIME: 19.1 s — AB (ref 11.6–15.2)

## 2015-04-20 MED ORDER — WARFARIN SODIUM 7.5 MG PO TABS
7.5000 mg | ORAL_TABLET | Freq: Once | ORAL | Status: AC
Start: 2015-04-20 — End: 2015-04-20
  Administered 2015-04-20: 7.5 mg via ORAL
  Filled 2015-04-20: qty 1

## 2015-04-20 MED ORDER — HEPARIN (PORCINE) IN NACL 100-0.45 UNIT/ML-% IJ SOLN
950.0000 [IU]/h | INTRAMUSCULAR | Status: DC
  Administered 2015-04-20 – 2015-04-22 (×2): 950 [IU]/h via INTRAVENOUS
  Filled 2015-04-20 (×2): qty 250

## 2015-04-20 NOTE — Progress Notes (Addendum)
ANTICOAGULATION CONSULT NOTE - Follow Up Consult  Pharmacy Consult:  Heparin / Coumadin Indication:  Mechanical aortic valve  Allergies  Allergen Reactions  . Darvon [Propoxyphene] Nausea And Vomiting  . Lisinopril Cough  . Septra [Sulfamethoxazole-Trimethoprim]     Increased INR  . Warfarin And Related Other (See Comments)    Generic Warfarin -- pt states it makes her "crazy"  ONLY TOLERATES BRAND  . Penicillins Rash    Patient Measurements: Height: 5' (152.4 cm) Weight: 137 lb 12.6 oz (62.5 kg) IBW/kg (Calculated) : 45.5 Heparin dosing weight: 58kg  Vital Signs: Temp: 98.1 F (36.7 C) (07/29 0555) Temp Source: Oral (07/29 0555) BP: 132/46 mmHg (07/29 0555) Pulse Rate: 69 (07/29 0555)  Labs:  Recent Labs  04/18/15 0558 04/19/15 0506 04/19/15 1720 04/20/15 0610  HGB 9.0* 8.6* 8.8* 8.5*  HCT 26.7* 25.2* 25.9* 25.2*  PLT 167 160 171 179  LABPROT 16.8* 17.2*  --   --   INR 1.35 1.40  --   --   HEPARINUNFRC  --  0.18* <0.10* 0.41  CREATININE 1.40* 1.29*  --  1.17*    Estimated Creatinine Clearance: 32.2 mL/min (by C-G formula based on Cr of 1.17).   Assessment: 45 YOF on Coumadin PTA for history of mechanical aortic valve. Patient presented with acute lower GIB.  S/p stent placement on 04/12/15 and patient was started on Plavix following DES placement.  Pharmacy consulted to dose warfarin for mechanical aortic valve and heparin bridge was later added.  Heparin level therapeutic and INR is trending up.  No further bleeding reported.  PTA Warfarin Dosing: 2.5mg  Sat and 5mg  AODs   Goal of Therapy:  INR 2-3  Heparin level: 0.3-0.5 units/mL Monitor platelets by anticoagulation protocol: Yes    Plan:  - Continue heparin gtt at 1000 units/hr - Check confirmatory HL - Repeat Coumadin 7.5mg  PO today - Daily HL / CBC / PT / INR    [name] D. Mina Marble, PharmD, BCPS Pager:  [phone] 04/20/2015, 7:38 AM   ====================   Addendum: - HL slightly above  goal   Plan: - Decrease heparin gtt slightly to 950 units/hr - F/U AM labs    [name] D. Mina Marble, PharmD, BCPS Pager:  [phone] 04/20/2015, 2:51 PM

## 2015-04-20 NOTE — Progress Notes (Signed)
PROGRESS NOTE  CURTISHA WRICE [id number] DOB: 04/29/1933 DOA: 04/15/2015 PCP: Simona Huh, MD   Brief history 81 year old female with a history of diabetes mellitus, hypertension, dyslipidemia, atrial fibrillation and mechanical aortic valve replacement on warfarin, recurrent GI bleeding and setting of AVMs, known CAD with previous bare metal stents and recently his charge 7/22 after undergoing placement of DES to the RCA 7/21. She was started on Plavix. Patient presented to the ER with rectal bleeding. Episode was associated with chest tightness and shortness of breath. The patient's hemoglobin was 9.9 at the time of admission. She was transfused 1 unit of PRBC. Cardiology and gastroenterology were consulted. Initially, the patient's warfarin and Plavix were discontinued. However, the patient was restarted on her Coumadin and Plavix on 04/16/2015 with close monitoring. She had another episode of rectal bleeding on 7/28, 7/29.  Assessment/Plan:  Acute lower GI bleeding/known history SB AVMs -Recurrence of rectal bleeding yesterday and today. Gastroenterology is following. They feel that her bleeding is due to hemorrhoids. They feel that if her bleeding does not subside, she may need surgical intervention. -Patient was previously started on Anusol due to concerns for hemorrhoidal bleeding -Restarted Plavix and Coumadin 7/25 -In the past, these bleeds have been self-limiting in nature -Colonoscopy was negative in 01/2013 and enteroscopy showed gastritis in 01/2013. GI to decide on endoscopy.  Chronic blood loss anemia -one unit PRBC transfused on 7/24 -Hemoglobin has remained stable. We will repeat this evening.   CAD -recent DES to RCA 04/12/15/Elevated troponin -Cardiology is following closely-->Plavix x 3 months at minimum -Suspect elevated troponin more reflective of demand ischemia from symptomatic anemia -Because of soft blood pressures we are holding most of her cardiac  medications including nitrates, Lasix and Diovan-->BP gradually improving -continue very low-dose beta blocker with hold parameters  Chronic anticoagulation/S/P Mechanical AVR -Restarted warfarin 04/16/15. Started on IV heparin for bridging purposes by cardiology. Continue heparin for now. If she has persistent active bleeding we may have to discontinue.  Diabetes mellitus type 2 -holding Invokana, Amaryl and Januvia. Continue SSI  -Follow CBGs  -Hemoglobin A1c 6.2  Chronic atrial fibrillation -Continue beta blocker and digoxin -CHADVASc = 6. On warfarin as discussed above.  Acute renal failure superimposed on stage 3 chronic kidney disease -Mild azotemia likely related to GI bleeding. Now improved -Holding Lasix and valsartan. Will need to be discharged on lower dose of Lasix. Possibly hold Valsartan for now. -Baseline creatinine 1.6-1.8. Creatinine is improved.  Essential HTN -Blood pressure stable -Echocardiogram 7/19 with normal LV function and mild LVH without mention of definitive diastolic dysfunction  -valsartan and Lasix on hold   Hyperlipidemia - lipitor can be resumed at discharge  DVT prophylaxis: On anticoagulation CODE STATUS: Full code Family Communication:Discussed with patient Disposition Plan:Continue to monitor and wait for cessation of GI bleed. Continue warfarin and heparin.  Procedures/Studies: Dg Chest Portable 1 View  04/15/2015   CLINICAL DATA:  82 year old presenting with melena and bright red blood per rectum. Coronary stent placed 1 week ago, and patient placed on anticoagulation with Coumadin and Plavix. Patient complains of generalized weakness and chest pressure worse with exertion. Prior CABG and aortic valve replacement. Prior episodes of GI bleeding related to small bowel AVMs.  EXAM: PORTABLE CHEST - 1 VIEW  COMPARISON:  08/09/2010 and earlier.  FINDINGS: Sternotomy for CABG and aortic valve replacement. Cardiac silhouette upper normal in  size for AP portable technique, unchanged. Lungs clear. Bronchovascular markings normal.  Pulmonary vascularity normal. No visible pleural effusions. No pneumothorax.  IMPRESSION: Stable borderline heart size.  No acute cardiopulmonary disease.   Electronically Signed   By: Evangeline Dakin M.D.   On: 04/15/2015 12:11     Subjective: Patient feels well. Denies any chest pain or shortness of breath. Had a small episode of bleeding yesterday afternoon and then once again this morning. Unable to quantify.    Objective: Filed Vitals:   04/19/15 1528 04/19/15 2110 04/20/15 0555 04/20/15 1005  BP: 158/49 152/58 132/46 123/51  Pulse: 75 76 69 80  Temp: 98.4 F (36.9 C) 98.2 F (36.8 C) 98.1 F (36.7 C)   TempSrc: Oral Oral Oral   Resp: 22 18 18    Height:      Weight:   62.5 kg (137 lb 12.6 oz)   SpO2: 100% 100% 100%     Intake/Output Summary (Last 24 hours) at 04/20/15 1132 Last data filed at 04/20/15 1000  Gross per 24 hour  Intake    800 ml  Output   1750 ml  Net   -950 ml   Weight change: 0 kg (0 lb)  Exam:   General:  Pt is alert, not in acute distress  Cardiovascular: RRR, S1/S2, no rubs, no gallops  Respiratory: CTA bilaterally, no wheezing, no crackles, no rhonchi.  Abdomen: Soft/+BS, non tender, non distended, no guarding  Alert and oriented 3. No focal neurological deficits are noted.   Data Reviewed: Basic Metabolic Panel:  Recent Labs Lab 04/16/15 0318 04/17/15 0347 04/18/15 0558 04/19/15 0506 04/20/15 0610  NA 135 137 138 138 138  K 3.9 4.0 4.0 4.1 4.2  CL 104 107 111 110 108  CO2 24 24 22 23 23   GLUCOSE 92 100* 118* 122* 129*  BUN 31* 21* 19 18 13   CREATININE 1.84* 1.58* 1.40* 1.29* 1.17*  CALCIUM 8.3* 8.5* 8.7* 8.7* 8.8*   Liver Function Tests:  Recent Labs Lab 04/15/15 1145  AST 30  ALT 20  ALKPHOS 41  BILITOT 1.1  PROT 7.6  ALBUMIN 3.7    Recent Labs Lab 04/15/15 1145  LIPASE 35   CBC:  Recent Labs Lab 04/15/15 1145   04/17/15 0347 04/18/15 0558 04/19/15 0506 04/19/15 1720 04/20/15 0610  WBC 8.3  < > 5.5 5.3 5.4 5.1 5.3  NEUTROABS 5.8  --   --   --   --   --   --   HGB 9.9*  < > 9.0* 9.0* 8.6* 8.8* 8.5*  HCT 29.7*  < > 26.5* 26.7* 25.2* 25.9* 25.2*  MCV 93.7  < > 92.7 92.7 92.0 90.2 91.6  PLT 232  < > 172 167 160 171 179  < > = values in this interval not displayed. Cardiac Enzymes:  Recent Labs Lab 04/15/15 1145  TROPONINI 0.55*   CBG:  Recent Labs Lab 04/19/15 0803 04/19/15 1151 04/19/15 1653 04/19/15 2223 04/20/15 0806  GLUCAP 133* 118* 144* 103* 133*    Recent Results (from the past 240 hour(s))  MRSA PCR Screening     Status: Abnormal   Collection Time: 04/15/15  4:19 PM  Result Value Ref Range Status   MRSA by PCR POSITIVE (A) NEGATIVE Final    Comment:        The GeneXpert MRSA Assay (FDA approved for NASAL specimens only), is one component of a comprehensive MRSA colonization surveillance program. It is not intended to diagnose MRSA infection nor to guide or monitor treatment for MRSA infections. RESULT CALLED TO,  READ BACK BY AND VERIFIED WITH: HOOD,T RN 04/15/15 1750 WOOTEN,K      Scheduled Meds: . clopidogrel  75 mg Oral Daily  . digoxin  0.125 mg Oral Daily  . hydrocortisone  25 mg Rectal BID  . insulin aspart  0-9 Units Subcutaneous TID WC  . metoprolol tartrate  12.5 mg Oral BID  . pantoprazole  40 mg Oral Q0600  . sodium chloride  3 mL Intravenous Q12H  . warfarin  7.5 mg Oral ONCE-1800  . Warfarin - Pharmacist Dosing Inpatient   Does not apply q1800   Continuous Infusions: . heparin 1,000 Units/hr (04/19/15 2116)     [facility] Pager [phone]  If 7PM-7AM, please contact night-coverage [email or web address] Password [email or web address] 04/20/2015, 11:32 AM   LOS: 5 days

## 2015-04-20 NOTE — Progress Notes (Signed)
Daily Rounding Note  04/20/2015, 9:32 AM  LOS: 5 days   SUBJECTIVE:       Soft stool with blood this AM, feels well otherwise.   OBJECTIVE:         Vital signs in last 24 hours:    Temp:  [98.1 F (36.7 C)-98.4 F (36.9 C)] 98.1 F (36.7 C) (07/29 0555) Pulse Rate:  [69-77] 69 (07/29 0555) Resp:  [18-22] 18 (07/29 0555) BP: (132-158)/(46-58) 132/46 mmHg (07/29 0555) SpO2:  [100 %] 100 % (07/29 0555) Weight:  [137 lb 12.6 oz (62.5 kg)] 137 lb 12.6 oz (62.5 kg) (07/29 0555) Last BM Date: 04/19/15 Filed Weights   04/18/15 0558 04/19/15 0630 04/20/15 0555  Weight: 137 lb 5.6 oz (62.3 kg) 137 lb 12.6 oz (62.5 kg) 137 lb 12.6 oz (62.5 kg)   General: comfortable, not ill appearing   Heart: RRR.  Chest: clear bil.  No dyspnea.  Abdomen: soft, NT, ND.  Active BS  Extremities: no CCE Neuro/Psych:  Oriented x 3.  Alert.  Relaxed.    Intake/Output from previous day: 07/28 0701 - 07/29 0700 In: 560 [P.O.:440] Out: 1250 [Urine:1250]  Intake/Output this shift: Total I/O In: -  Out: 300 [Urine:300]  Lab Results:  Recent Labs  04/19/15 0506 04/19/15 1720 04/20/15 0610  WBC 5.4 5.1 5.3  HGB 8.6* 8.8* 8.5*  HCT 25.2* 25.9* 25.2*  PLT 160 171 179   BMET  Recent Labs  04/18/15 0558 04/19/15 0506 04/20/15 0610  NA 138 138 138  K 4.0 4.1 4.2  CL 111 110 108  CO2 22 23 23   GLUCOSE 118* 122* 129*  BUN 19 18 13   CREATININE 1.40* 1.29* 1.17*  CALCIUM 8.7* 8.7* 8.8*   LFT No results for input(s): PROT, ALBUMIN, AST, ALT, ALKPHOS, BILITOT, BILIDIR, IBILI in the last 72 hours. PT/INR  Recent Labs  04/19/15 0506 04/20/15 0610  LABPROT 17.2* 19.1*  INR 1.40 1.60*   Hepatitis Panel No results for input(s): HEPBSAG, HCVAB, HEPAIGM, HEPBIGM in the last 72 hours.  Studies/Results: No results found.   Scheduled Meds: . clopidogrel  75 mg Oral Daily  . digoxin  0.125 mg Oral Daily  . hydrocortisone  25 mg  Rectal BID  . insulin aspart  0-9 Units Subcutaneous TID WC  . metoprolol tartrate  12.5 mg Oral BID  . mupirocin ointment  1 application Nasal BID  . pantoprazole  40 mg Oral Q0600  . sodium chloride  3 mL Intravenous Q12H  . warfarin  7.5 mg Oral ONCE-1800  . Warfarin - Pharmacist Dosing Inpatient   Does not apply q1800   Continuous Infusions: . heparin 1,000 Units/hr (04/19/15 2116)   PRN Meds:.ondansetron **OR** ondansetron (ZOFRAN) IV, zolpidem   ASSESMENT:   * Painless, hematochezia. Persistent. 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   Hgb drop of 0.5 grams in last 2 days.   * Chronic Coumadin for mechanical AVR, PAF. Still on heparin drip as INR subtherapeutic.   * S/p PCI/cardiac DES stent 7/21, started Plavix after this.    PLAN   *  Continue Anusol HC  *  Flex sig, diagnostic, tomorrow AM.  Tap water enema pre procedure.     Azucena Freed  04/20/2015, 9:32 AM Pager: [phone]     Attending physician's  note   I have taken an interval history, reviewed the chart and examined the patient. I agree with the Advanced Practitioner's note, impression and recommendations. Small volume hematochezia again this morning with mild drift in Hb over past few days. Will proceed with diagnostic flex sigmoidoscopy tomorrow with patient staying on anticoagulant/antiplatelet medications.  Pricilla Riffle. Fuller Plan, MD [facility] [phone] pager Mon-Fri 8a-5p [phone] weekends, holidays and 5p-8a or per [facility]

## 2015-04-20 NOTE — Care Management Important Message (Signed)
Important Message  Patient Details  Name: [name] MRN: [id number] Date of Birth: 1933/03/05   Medicare Important Message Given:  Yes-fourth notification given    Carles Collet, RN 04/20/2015, 10:33 AM

## 2015-04-20 NOTE — Progress Notes (Signed)
SUBJECTIVE:  No CP.  More blood with BM this AM.   OBJECTIVE:   Vitals:   Filed Vitals:   04/19/15 1528 04/19/15 2110 04/20/15 0555 04/20/15 1005  BP: 158/49 152/58 132/46 123/51  Pulse: 75 76 69 80  Temp: 98.4 F (36.9 C) 98.2 F (36.8 C) 98.1 F (36.7 C)   TempSrc: Oral Oral Oral   Resp: 22 18 18    Height:      Weight:   137 lb 12.6 oz (62.5 kg)   SpO2: 100% 100% 100%    I&O's:   Intake/Output Summary (Last 24 hours) at 04/20/15 1209 Last data filed at 04/20/15 1000  Gross per 24 hour  Intake    800 ml  Output   1750 ml  Net   -950 ml   TELEMETRY: Reviewed telemetry pt in NSR:     PHYSICAL EXAM General: Well developed, well nourished, in no acute distress Head:   Normal cephalic and atramatic  Lungs:   Clear bilaterally to auscultation. Heart:   HRRR S1 crisp S2 click  No JVD.   Abdomen: abdomen soft and non-tender Msk:  Back normal,  Normal strength and tone for age. Extremities:   No edema.   Neuro: Alert and oriented. Psych:  Normal affect, responds appropriately Skin: No rash   LABS: Basic Metabolic Panel:  Recent Labs  04/19/15 0506 04/20/15 0610  NA 138 138  K 4.1 4.2  CL 110 108  CO2 23 23  GLUCOSE 122* 129*  BUN 18 13  CREATININE 1.29* 1.17*  CALCIUM 8.7* 8.8*   Liver Function Tests: No results for input(s): AST, ALT, ALKPHOS, BILITOT, PROT, ALBUMIN in the last 72 hours. No results for input(s): LIPASE, AMYLASE in the last 72 hours. CBC:  Recent Labs  04/19/15 1720 04/20/15 0610  WBC 5.1 5.3  HGB 8.8* 8.5*  HCT 25.9* 25.2*  MCV 90.2 91.6  PLT 171 179   Cardiac Enzymes: No results for input(s): CKTOTAL, CKMB, CKMBINDEX, TROPONINI in the last 72 hours. BNP: Invalid input(s): POCBNP D-Dimer: No results for input(s): DDIMER in the last 72 hours. Hemoglobin A1C: No results for input(s): HGBA1C in the last 72 hours. Fasting Lipid Panel: No results for input(s): CHOL, HDL, LDLCALC, TRIG, CHOLHDL, LDLDIRECT in the last 72  hours. Thyroid Function Tests: No results for input(s): TSH, T4TOTAL, T3FREE, THYROIDAB in the last 72 hours.  Invalid input(s): FREET3 Anemia Panel: No results for input(s): VITAMINB12, FOLATE, FERRITIN, TIBC, IRON, RETICCTPCT in the last 72 hours. Coag Panel:   Lab Results  Component Value Date   INR 1.60* 04/20/2015   INR 1.40 04/19/2015   INR 1.35 04/18/2015    RADIOLOGY: Dg Chest Portable 1 View  04/15/2015   CLINICAL DATA:  81 year old presenting with melena and bright red blood per rectum. Coronary stent placed 1 week ago, and patient placed on anticoagulation with Coumadin and Plavix. Patient complains of generalized weakness and chest pressure worse with exertion. Prior CABG and aortic valve replacement. Prior episodes of GI bleeding related to small bowel AVMs.  EXAM: PORTABLE CHEST - 1 VIEW  COMPARISON:  08/09/2010 and earlier.  FINDINGS: Sternotomy for CABG and aortic valve replacement. Cardiac silhouette upper normal in size for AP portable technique, unchanged. Lungs clear. Bronchovascular markings normal. Pulmonary vascularity normal. No visible pleural effusions. No pneumothorax.  IMPRESSION: Stable borderline heart size.  No acute cardiopulmonary disease.   Electronically Signed   By: Evangeline Dakin M.D.   On: 04/15/2015 12:11  ASSESSMENT: Kathyrn Lass:    1) CAD: s/p PCI to RCA with Synergy DES. COntinue Plavix at this time.  She has done well with Plavix at the time of her prior PCI.     2) AVR: COumadin subtherapeutic. IV heparin at this time. Heparin level therapeutic. INR target 2.0-3.0. Ideally, would stay at the lower end of the range. Once INR above 2, would stop heparin.  3) Anemia: May need transfusion if Hbg drops more.  GI following regarding lower GI bleed.   From a cardiac standpoint, she will need to go home on clopidogrel and Coumadin.  WOuld not use aspirin in her case due to the bleeding risk.   Jettie Booze, MD  04/20/2015  12:09  PM

## 2015-04-21 ENCOUNTER — Encounter (HOSPITAL_COMMUNITY)
Admission: EM | Disposition: A | Discharge status: Home / Self Care | Payer: Self-pay | Source: Home / Self Care | Attending: Internal Medicine

## 2015-04-21 ENCOUNTER — Encounter (HOSPITAL_COMMUNITY): Payer: Self-pay

## 2015-04-21 HISTORY — PX: FLEXIBLE SIGMOIDOSCOPY: SHX5431

## 2015-04-21 LAB — CBC
HCT: 23.8 % — ABNORMAL LOW (ref 36.0–46.0)
Hemoglobin: 7.9 g/dL — ABNORMAL LOW (ref 12.0–15.0)
MCH: 30 pg (ref 26.0–34.0)
MCHC: 33.2 g/dL (ref 30.0–36.0)
MCV: 90.5 fL (ref 78.0–100.0)
PLATELETS: 190 10*3/uL (ref 150–400)
RBC: 2.63 MIL/uL — ABNORMAL LOW (ref 3.87–5.11)
RDW: 14.4 % (ref 11.5–15.5)
WBC: 4.5 10*3/uL (ref 4.0–10.5)

## 2015-04-21 LAB — HEPARIN LEVEL (UNFRACTIONATED): Heparin Unfractionated: 0.42 IU/mL (ref 0.30–0.70)

## 2015-04-21 LAB — PROTIME-INR
INR: 1.79 — AB (ref 0.00–1.49)
Prothrombin Time: 20.8 seconds — ABNORMAL HIGH (ref 11.6–15.2)

## 2015-04-21 LAB — GLUCOSE, CAPILLARY
GLUCOSE-CAPILLARY: 121 mg/dL — AB (ref 65–99)
Glucose-Capillary: 128 mg/dL — ABNORMAL HIGH (ref 65–99)
Glucose-Capillary: 138 mg/dL — ABNORMAL HIGH (ref 65–99)

## 2015-04-21 LAB — PREPARE RBC (CROSSMATCH)

## 2015-04-21 SURGERY — SIGMOIDOSCOPY, FLEXIBLE
Anesthesia: Moderate Sedation

## 2015-04-21 MED ORDER — SODIUM CHLORIDE 0.9 % IV SOLN
Freq: Once | INTRAVENOUS | Status: AC
  Administered 2015-04-21: 500 mL via INTRAVENOUS

## 2015-04-21 MED ORDER — FENTANYL CITRATE (PF) 100 MCG/2ML IJ SOLN
INTRAMUSCULAR | Status: DC | PRN
  Administered 2015-04-21: 25 ug via INTRAVENOUS

## 2015-04-21 MED ORDER — WARFARIN SODIUM 5 MG PO TABS
5.0000 mg | ORAL_TABLET | Freq: Once | ORAL | Status: AC
  Administered 2015-04-21: 5 mg via ORAL
  Filled 2015-04-21: qty 1

## 2015-04-21 MED ORDER — MIDAZOLAM HCL 5 MG/ML IJ SOLN
INTRAMUSCULAR | Status: AC
  Filled 2015-04-21: qty 1

## 2015-04-21 MED ORDER — FUROSEMIDE 10 MG/ML IJ SOLN
20.0000 mg | Freq: Once | INTRAMUSCULAR | Status: AC
  Administered 2015-04-21: 20 mg via INTRAVENOUS
  Filled 2015-04-21: qty 2

## 2015-04-21 MED ORDER — MIDAZOLAM HCL 10 MG/2ML IJ SOLN
INTRAMUSCULAR | Status: DC | PRN
  Administered 2015-04-21: 2 mg via INTRAVENOUS

## 2015-04-21 MED ORDER — FENTANYL CITRATE (PF) 100 MCG/2ML IJ SOLN
INTRAMUSCULAR | Status: AC
  Filled 2015-04-21: qty 2

## 2015-04-21 MED ORDER — WARFARIN SODIUM 7.5 MG PO TABS: 7.5000 mg | ORAL_TABLET | Freq: Once | ORAL | Status: DC

## 2015-04-21 NOTE — H&P (View-Only) (Signed)
Daily Rounding Note  04/20/2015, 9:32 AM  LOS: 5 days   SUBJECTIVE:       Soft stool with blood this AM, feels well otherwise.   OBJECTIVE:         Vital signs in last 24 hours:    Temp:  [98.1 F (36.7 C)-98.4 F (36.9 C)] 98.1 F (36.7 C) (07/29 0555) Pulse Rate:  [69-77] 69 (07/29 0555) Resp:  [18-22] 18 (07/29 0555) BP: (132-158)/(46-58) 132/46 mmHg (07/29 0555) SpO2:  [100 %] 100 % (07/29 0555) Weight:  [137 lb 12.6 oz (62.5 kg)] 137 lb 12.6 oz (62.5 kg) (07/29 0555) Last BM Date: 04/19/15 Filed Weights   04/18/15 0558 04/19/15 0630 04/20/15 0555  Weight: 137 lb 5.6 oz (62.3 kg) 137 lb 12.6 oz (62.5 kg) 137 lb 12.6 oz (62.5 kg)   General: comfortable, not ill appearing   Heart: RRR.  Chest: clear bil.  No dyspnea.  Abdomen: soft, NT, ND.  Active BS  Extremities: no CCE Neuro/Psych:  Oriented x 3.  Alert.  Relaxed.    Intake/Output from previous day: 07/28 0701 - 07/29 0700 In: 560 [P.O.:440] Out: 1250 [Urine:1250]  Intake/Output this shift: Total I/O In: -  Out: 300 [Urine:300]  Lab Results:  Recent Labs  04/19/15 0506 04/19/15 1720 04/20/15 0610  WBC 5.4 5.1 5.3  HGB 8.6* 8.8* 8.5*  HCT 25.2* 25.9* 25.2*  PLT 160 171 179   BMET  Recent Labs  04/18/15 0558 04/19/15 0506 04/20/15 0610  NA 138 138 138  K 4.0 4.1 4.2  CL 111 110 108  CO2 22 23 23   GLUCOSE 118* 122* 129*  BUN 19 18 13   CREATININE 1.40* 1.29* 1.17*  CALCIUM 8.7* 8.7* 8.8*   LFT No results for input(s): PROT, ALBUMIN, AST, ALT, ALKPHOS, BILITOT, BILIDIR, IBILI in the last 72 hours. PT/INR  Recent Labs  04/19/15 0506 04/20/15 0610  LABPROT 17.2* 19.1*  INR 1.40 1.60*   Hepatitis Panel No results for input(s): HEPBSAG, HCVAB, HEPAIGM, HEPBIGM in the last 72 hours.  Studies/Results: No results found.   Scheduled Meds: . clopidogrel  75 mg Oral Daily  . digoxin  0.125 mg Oral Daily  . hydrocortisone  25 mg  Rectal BID  . insulin aspart  0-9 Units Subcutaneous TID WC  . metoprolol tartrate  12.5 mg Oral BID  . mupirocin ointment  1 application Nasal BID  . pantoprazole  40 mg Oral Q0600  . sodium chloride  3 mL Intravenous Q12H  . warfarin  7.5 mg Oral ONCE-1800  . Warfarin - Pharmacist Dosing Inpatient   Does not apply q1800   Continuous Infusions: . heparin 1,000 Units/hr (04/19/15 2116)   PRN Meds:.ondansetron **OR** ondansetron (ZOFRAN) IV, zolpidem   ASSESMENT:   * Painless, hematochezia. Persistent. 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   Hgb drop of 0.5 grams in last 2 days.   * Chronic Coumadin for mechanical AVR, PAF. Still on heparin drip as INR subtherapeutic.   * S/p PCI/cardiac DES stent 7/21, started Plavix after this.    PLAN   *  Continue Anusol HC  *  Flex sig, diagnostic, tomorrow AM.  Tap water enema pre procedure.     Azucena Freed  04/20/2015, 9:32 AM Pager: [phone]     Attending physician's  note   I have taken an interval history, reviewed the chart and examined the patient. I agree with the Advanced Practitioner's note, impression and recommendations. Small volume hematochezia again this morning with mild drift in Hb over past few days. Will proceed with diagnostic flex sigmoidoscopy tomorrow with patient staying on anticoagulant/antiplatelet medications.  Pricilla Riffle. Fuller Plan, MD Marval Regal [phone] pager Mon-Fri 8a-5p [phone] weekends, holidays and 5p-8a or per [facility]

## 2015-04-21 NOTE — Progress Notes (Signed)
Dr Garrison Columbus note reviewed, agree with most recent recommendations. Primary issue is GI bleeding, Hgb down to 7.9. Flex sig without clear bleeding source, considerations for colonoscopy. Management complicated by recent DES to RCA 04/12/15 as well as anticoagulation for her mechanical St Jude AV and afib. From cardiac standpoint if intended colonoscopy can hold coumadin and maintain heparin gtt up until a few hours prior to planned colonoscopy. I would not discontinue her plavix at this time unless severe bleeding/hemodynamic compromise due to high risk of instent thrombosis just 9 days out from DES  placement, if a diagnostic colonoscopy can be done on plavix alone that would be ideal from our standpoint. If cannot do colonoscopy on plavix would recommend transfusion and conservative management/observation of her bleeding, from prior records that have been self terminating in the past.    Zandra Abts MD

## 2015-04-21 NOTE — Progress Notes (Addendum)
ANTICOAGULATION CONSULT NOTE - Follow Up Consult  Pharmacy Consult:  Heparin / Coumadin Indication:  Mechanical aortic valve  Allergies  Allergen Reactions  . Darvon [Propoxyphene] Nausea And Vomiting  . Lisinopril Cough  . Septra [Sulfamethoxazole-Trimethoprim]     Increased INR  . Warfarin And Related Other (See Comments)    Generic Warfarin -- pt states it makes her "crazy"  ONLY TOLERATES BRAND  . Penicillins Rash    Patient Measurements: Height: 5' (152.4 cm) Weight: 137 lb 12.6 oz (62.5 kg) IBW/kg (Calculated) : 45.5 Heparin dosing weight: 58kg  Vital Signs: Temp: 98.2 F (36.8 C) (07/29 2213) Temp Source: Oral (07/29 2213) BP: 155/50 mmHg (07/29 2213) Pulse Rate: 70 (07/29 2213)  Labs:  Recent Labs  04/19/15 0506  04/20/15 0610 04/20/15 1227 04/20/15 1524 04/21/15 0504  HGB 8.6*  < > 8.5*  --  9.5* 7.9*  HCT 25.2*  < > 25.2*  --  28.4* 23.8*  PLT 160  < > 179  --  209 190  LABPROT 17.2*  --  19.1*  --   --  20.8*  INR 1.40  --  1.60*  --   --  1.79*  HEPARINUNFRC 0.18*  < > 0.41 0.53  --  0.42  CREATININE 1.29*  --  1.17*  --   --   --   < > = values in this interval not displayed.  Estimated Creatinine Clearance: 32.2 mL/min (by C-G formula based on Cr of 1.17).   Assessment: 25 YOF on Coumadin PTA for history of mechanical aortic valve. Patient presented with acute lower GIB.  S/p stent placement on 04/12/15 and patient was started on Plavix following DES placement.  Pharmacy consulted to dose warfarin for mechanical aortic valve and heparin bridge was later added.  Heparin level therapeutic and INR is trending up.  Bleeding reported on 7/28 and 7/29 thought to be due to hemorrhoids. No further bleeding reported.  PTA Warfarin Dosing: 2.5mg  Sat and 5mg  AODs   Goal of Therapy:  INR 2-3  Heparin level: 0.3-0.5 units/mL Monitor platelets by anticoagulation protocol: Yes    Plan:  - Continue heparin gtt at 950 units/hr - Warfarin 5mg  PO today -  Daily HL / CBC / PT / INR  Darl Pikes, PharmD Clinical Pharmacist- Resident Pager: [phone]

## 2015-04-21 NOTE — Progress Notes (Signed)
PROGRESS NOTE  KENOSHA GONCE [id number] DOB: Jun 13, 1933 DOA: 04/15/2015 PCP: Simona Huh, MD   Brief history 81 year old female with a history of diabetes mellitus, hypertension, dyslipidemia, atrial fibrillation and mechanical aortic valve replacement on warfarin, recurrent GI bleeding and setting of AVMs, known CAD with previous bare metal stents and recently his charge 7/22 after undergoing placement of DES to the RCA 7/21. She was started on Plavix. Patient presented to the ER with rectal bleeding. Episode was associated with chest tightness and shortness of breath. The patient's hemoglobin was 9.9 at the time of admission. She was transfused 1 unit of PRBC. Cardiology and gastroenterology were consulted. Initially, the patient's warfarin and Plavix were discontinued. However, the patient was restarted on her Coumadin and Plavix on 04/16/2015 with close monitoring. She had another episode of rectal bleeding on 7/28, 7/29. She underwent flexible sigmoidoscopy July 30. No bleeding source was identified.  Assessment/Plan:  Acute lower GI bleeding/known history SB AVMs -Recurrence of rectal bleeding over the last 2 days. None today so far. Asian underwent flexible sigmoidoscopy this morning which did not reveal any source of active bleeding. Gastroenterology wants to consider doing a colonoscopy. This could be a challenge considering the patient is on anticoagulation as well as Plavix. Please see Dr. Nelly Laurence note from today outlining recommendations regarding these agents if the patient needs to undergo colonoscopy.  -Patient was previously started on Anusol due to concerns for hemorrhoidal bleeding -Restarted Plavix and Coumadin 7/25 -In the past, these bleeds have been self-limiting in nature -Colonoscopy was negative in 01/2013 and enteroscopy showed gastritis in 01/2013.   Chronic blood loss anemia -one unit PRBC transfused on 7/24. -Hemoglobin has dropped further. We will  transfuse 2 units of blood with Lasix in between.   CAD -recent DES to RCA 04/12/15/Elevated troponin -Cardiology is following closely-->Plavix x 3 months at minimum -Suspect elevated troponin more reflective of demand ischemia from symptomatic anemia -Because of soft blood pressures we are holding most of her cardiac medications including nitrates, Lasix and Diovan-->BP gradually improving -continue low-dose beta blocker with hold parameters  Chronic anticoagulation/S/P Mechanical AVR -Restarted warfarin 04/16/15. Started on IV heparin for bridging purposes by cardiology. INR is slowly climbing.  Diabetes mellitus type 2 -holding Invokana, Amaryl and Januvia. Continue SSI  -Follow CBGs  -Hemoglobin A1c 6.2  Chronic atrial fibrillation -Continue beta blocker and digoxin -CHADVASc = 6. On warfarin as discussed above.  Acute renal failure superimposed on stage 3 chronic kidney disease -Mild azotemia likely related to GI bleeding. Now improved -Holding Lasix and valsartan. Will need to be discharged on lower dose of Lasix. Possibly hold Valsartan for now. Her EF is normal. -Baseline creatinine 1.6-1.8. Creatinine is improved.  Essential HTN -Blood pressure stable -Echocardiogram 7/19 with normal LV function and mild LVH without mention of definitive diastolic dysfunction  -valsartan and Lasix on hold   Hyperlipidemia - lipitor can be resumed at discharge  DVT prophylaxis: On anticoagulation CODE STATUS: Full code Family Communication:Discussed with patient Disposition Plan:Transfuse PRBCs today.    Procedures/Studies: Dg Chest Portable 1 View  04/15/2015   CLINICAL DATA:  81 year old presenting with melena and bright red blood per rectum. Coronary stent placed 1 week ago, and patient placed on anticoagulation with Coumadin and Plavix. Patient complains of generalized weakness and chest pressure worse with exertion. Prior CABG and aortic valve replacement. Prior episodes of  GI bleeding related to small bowel AVMs.  EXAM: PORTABLE CHEST -  1 VIEW  COMPARISON:  08/09/2010 and earlier.  FINDINGS: Sternotomy for CABG and aortic valve replacement. Cardiac silhouette upper normal in size for AP portable technique, unchanged. Lungs clear. Bronchovascular markings normal. Pulmonary vascularity normal. No visible pleural effusions. No pneumothorax.  IMPRESSION: Stable borderline heart size.  No acute cardiopulmonary disease.   Electronically Signed   By: Evangeline Dakin M.D.   On: 04/15/2015 12:11    Flexible sigmoidoscopy 7/30 ENDOSCOPIC IMPRESSION: 1. Small internal and external hemorrhoids 2. Otherwise normal sigmoidoscopy  Subjective: Patient denies any complaints. No further rectal bleeding since yesterday morning. Denies any chest pain or shortness of breath.    Objective: Filed Vitals:   04/21/15 0915 04/21/15 0920 04/21/15 0925 04/21/15 0935  BP: 150/50 181/62  132/41  Pulse: 64 71 68 62  Temp:      TempSrc:      Resp: 16 16 20 16   Height:      Weight:      SpO2: 100% 100% 100% 100%    Intake/Output Summary (Last 24 hours) at 04/21/15 1115 Last data filed at 04/21/15 1028  Gross per 24 hour  Intake    103 ml  Output    400 ml  Net   -297 ml   Weight change:   Exam:   General:  Pt is alert, not in acute distress  Cardiovascular: RRR, S1/S2, no rubs, no gallops  Respiratory: CTA bilaterally, no wheezing, no crackles, no rhonchi.  Abdomen: Soft/+BS, non tender, non distended, no guarding  Alert and oriented 3. No focal neurological deficits are noted.   Data Reviewed: Basic Metabolic Panel:  Recent Labs Lab 04/16/15 0318 04/17/15 0347 04/18/15 0558 04/19/15 0506 04/20/15 0610  NA 135 137 138 138 138  K 3.9 4.0 4.0 4.1 4.2  CL 104 107 111 110 108  CO2 24 24 22 23 23   GLUCOSE 92 100* 118* 122* 129*  BUN 31* 21* 19 18 13   CREATININE 1.84* 1.58* 1.40* 1.29* 1.17*  CALCIUM 8.3* 8.5* 8.7* 8.7* 8.8*   Liver Function  Tests:  Recent Labs Lab 04/15/15 1145  AST 30  ALT 20  ALKPHOS 41  BILITOT 1.1  PROT 7.6  ALBUMIN 3.7    Recent Labs Lab 04/15/15 1145  LIPASE 35   CBC:  Recent Labs Lab 04/15/15 1145  04/19/15 0506 04/19/15 1720 04/20/15 0610 04/20/15 1524 04/21/15 0504  WBC 8.3  < > 5.4 5.1 5.3 6.8 4.5  NEUTROABS 5.8  --   --   --   --   --   --   HGB 9.9*  < > 8.6* 8.8* 8.5* 9.5* 7.9*  HCT 29.7*  < > 25.2* 25.9* 25.2* 28.4* 23.8*  MCV 93.7  < > 92.0 90.2 91.6 92.2 90.5  PLT 232  < > 160 171 179 209 190  < > = values in this interval not displayed.  Cardiac Enzymes:  Recent Labs Lab 04/15/15 1145  TROPONINI 0.55*   CBG:  Recent Labs Lab 04/20/15 0806 04/20/15 1233 04/20/15 1723 04/20/15 2209 04/21/15 0803  GLUCAP 133* 106* 159* 133* 128*    Recent Results (from the past 240 hour(s))  MRSA PCR Screening     Status: Abnormal   Collection Time: 04/15/15  4:19 PM  Result Value Ref Range Status   MRSA by PCR POSITIVE (A) NEGATIVE Final    Comment:        The GeneXpert MRSA Assay (FDA approved for NASAL specimens only), is one component of a comprehensive  MRSA colonization surveillance program. It is not intended to diagnose MRSA infection nor to guide or monitor treatment for MRSA infections. RESULT CALLED TO, READ BACK BY AND VERIFIED WITH: HOOD,T RN 04/15/15 1750 WOOTEN,K      Scheduled Meds: . sodium chloride   Intravenous Once  . clopidogrel  75 mg Oral Daily  . digoxin  0.125 mg Oral Daily  . furosemide  20 mg Intravenous Once  . hydrocortisone  25 mg Rectal BID  . insulin aspart  0-9 Units Subcutaneous TID WC  . metoprolol tartrate  12.5 mg Oral BID  . pantoprazole  40 mg Oral Q0600  . sodium chloride  3 mL Intravenous Q12H  . warfarin  5 mg Oral ONCE-1800  . Warfarin - Pharmacist Dosing Inpatient   Does not apply q1800   Continuous Infusions: . heparin 950 Units/hr (04/20/15 2120)     Zanna Hawn [facility] Pager  [phone]  If 7PM-7AM, please contact night-coverage [email or web address] Password [email or web address] 04/21/2015, 11:15 AM   LOS: 6 days

## 2015-04-21 NOTE — Op Note (Signed)
[facility] [place], [id number]   FLEXIBLE SIGMOIDOSCOPY PROCEDURE REPORT  PATIENT: Renee, [name]  MR#: [id number] BIRTHDATE: Nov 17, 1932 , 16  yrs. old GENDER: female ENDOSCOPIST: Ladene Artist, MD, [facility] REFERRED BY: [facility] PROCEDURE DATE:  04/21/2015 PROCEDURE:   Sigmoidoscopy, diagnostic ASA CLASS:   Class III INDICATIONS:hematochezia. MEDICATIONS: Fentanyl 25 mcg IV and Versed 2 mg IV DESCRIPTION OF PROCEDURE:   After the risks benefits and alternatives of the procedure were thoroughly explained, informed consent was obtained.  Digital exam revealed external hemorrhoids. The     endoscope was introduced through the anus  and advanced to the descending colon , The exam was Without limitations.    The quality of the prep was The overall prep quality was adequate. . Estimated blood loss is zero unless otherwise noted in this procedure report. The instrument was then slowly withdrawn as the mucosa was fully examined.     COLON FINDINGS: A normal appearing descending, sigmoid colon, and rectum. A few small clots in the sigmoid colon.   Retroflexed views revealed small internal Grade I hemorrhoids.    The scope was then withdrawn from the patient and the procedure terminated.  COMPLICATIONS: There were no immediate complications.  ENDOSCOPIC IMPRESSION: 1.  Small internal and external hemorrhoids 2.  Otherwise normal sigmoidoscopy  RECOMMENDATIONS: 1.  No clear source of bleeding found. Colonoscopy to further evaluate bleeding source however will need to discuss timing and possibility of holding anticoagulants and antiplatelets with Cardiology and primary service   eSigned:  Ladene Artist, MD, [facility] 04/21/2015 9:31 AM

## 2015-04-21 NOTE — Interval H&P Note (Signed)
History and Physical Interval Note:  04/21/2015 9:07 AM  [name]  has presented today for surgery, with the diagnosis of rectal bleeding .  anemia  The various methods of treatment have been discussed with the patient and family. After consideration of risks, benefits and other options for treatment, the patient has consented to  Procedure(s): FLEXIBLE SIGMOIDOSCOPY (N/A) as a surgical intervention .  The patient's history has been reviewed, patient examined, no change in status, stable for surgery.  I have reviewed the patient's chart and labs.  Questions were answered to the patient's satisfaction.     Pricilla Riffle. Fuller Plan

## 2015-04-21 NOTE — Discharge Instructions (Signed)

## 2015-04-22 LAB — BASIC METABOLIC PANEL
Anion gap: 6 (ref 5–15)
BUN: 13 mg/dL (ref 6–20)
CHLORIDE: 110 mmol/L (ref 101–111)
CO2: 22 mmol/L (ref 22–32)
CREATININE: 1.26 mg/dL — AB (ref 0.44–1.00)
Calcium: 8.8 mg/dL — ABNORMAL LOW (ref 8.9–10.3)
GFR calc Af Amer: 46 mL/min — ABNORMAL LOW (ref >60)
GFR calc non Af Amer: 39 mL/min — ABNORMAL LOW (ref >60)
GLUCOSE: 122 mg/dL — AB (ref 65–99)
Potassium: 4.1 mmol/L (ref 3.5–5.1)
Sodium: 138 mmol/L (ref 135–145)

## 2015-04-22 LAB — TYPE AND SCREEN
ABO/RH(D): O NEG
Antibody Screen: NEGATIVE
Unit division: 0
Unit division: 0

## 2015-04-22 LAB — PROTIME-INR
INR: 1.69 — AB (ref 0.00–1.49)
Prothrombin Time: 19.9 seconds — ABNORMAL HIGH (ref 11.6–15.2)

## 2015-04-22 LAB — CBC
HEMATOCRIT: 30.9 % — AB (ref 36.0–46.0)
Hemoglobin: 10.5 g/dL — ABNORMAL LOW (ref 12.0–15.0)
MCH: 30.7 pg (ref 26.0–34.0)
MCHC: 34 g/dL (ref 30.0–36.0)
MCV: 90.4 fL (ref 78.0–100.0)
Platelets: 188 10*3/uL (ref 150–400)
RBC: 3.42 MIL/uL — ABNORMAL LOW (ref 3.87–5.11)
RDW: 14.8 % (ref 11.5–15.5)
WBC: 4.9 10*3/uL (ref 4.0–10.5)

## 2015-04-22 LAB — GLUCOSE, CAPILLARY
GLUCOSE-CAPILLARY: 101 mg/dL — AB (ref 65–99)
GLUCOSE-CAPILLARY: 121 mg/dL — AB (ref 65–99)
GLUCOSE-CAPILLARY: 136 mg/dL — AB (ref 65–99)
Glucose-Capillary: 140 mg/dL — ABNORMAL HIGH (ref 65–99)

## 2015-04-22 LAB — HEPARIN LEVEL (UNFRACTIONATED): Heparin Unfractionated: 0.28 IU/mL — ABNORMAL LOW (ref 0.30–0.70)

## 2015-04-22 MED ORDER — WARFARIN SODIUM 7.5 MG PO TABS
7.5000 mg | ORAL_TABLET | Freq: Once | ORAL | Status: AC
  Administered 2015-04-22: 7.5 mg via ORAL
  Filled 2015-04-22: qty 1

## 2015-04-22 MED ORDER — FUROSEMIDE 40 MG PO TABS
40.0000 mg | ORAL_TABLET | Freq: Two times a day (BID) | ORAL | Status: DC
  Administered 2015-04-22 – 2015-04-24 (×4): 40 mg via ORAL
  Filled 2015-04-22 (×4): qty 1

## 2015-04-22 MED ORDER — HEPARIN (PORCINE) IN NACL 100-0.45 UNIT/ML-% IJ SOLN
1000.0000 [IU]/h | INTRAMUSCULAR | Status: DC
  Administered 2015-04-22 – 2015-04-25 (×3): 1000 [IU]/h via INTRAVENOUS
  Filled 2015-04-22 (×3): qty 250

## 2015-04-22 NOTE — Progress Notes (Signed)
PROGRESS NOTE  [name] [id number] DOB: 02-18-1933 DOA: 04/15/2015 PCP: Simona Huh, MD   Brief history 81 year old female with a history of diabetes mellitus, hypertension, dyslipidemia, atrial fibrillation and mechanical aortic valve replacement on warfarin, recurrent GI bleeding and setting of AVMs, known CAD with previous bare metal stents and recently his charge 7/22 after undergoing placement of DES to the RCA 7/21. She was started on Plavix. Patient presented to the ER with rectal bleeding. Episode was associated with chest tightness and shortness of breath. The patient's hemoglobin was 9.9 at the time of admission. She was transfused 1 unit of PRBC. Cardiology and gastroenterology were consulted. Initially, the patient's warfarin and Plavix were discontinued. However, the patient was restarted on her Coumadin and Plavix on 04/16/2015 with close monitoring. She had another episode of rectal bleeding on 7/28, 7/29. She underwent flexible sigmoidoscopy July 30. No bleeding source was identified.  Assessment/Plan:  Acute lower GI bleeding/known history SB AVMs -Recurrent rectal bleeding. None today so far. Patient underwent flexible sigmoidoscopy 7/30 which did not reveal any source of active bleeding. Gastroenterology wants to consider doing a colonoscopy. This could be a challenge considering the patient is on anticoagulation as well as Plavix. Please see Dr. Nelly Laurence note from 7/30 outlining recommendations regarding these agents if the patient needs to undergo colonoscopy. Discussed with Dr. Fuller Plan today. We will hold off on colonoscopy for now. Patient also reluctant to undergo the same now that it appears her bleeding may be slowing down. If, however, bleeding recurs or is significant in amount we may need to proceed with colonoscopy. -Patient was previously started on Anusol due to concerns for hemorrhoidal bleeding -Restarted Plavix and Coumadin 7/25 -In the past,  these bleeds have been self-limiting in nature -Colonoscopy was negative in 01/2013 and enteroscopy showed gastritis in 01/2013.   Chronic blood loss anemia -one unit PRBC transfused on 7/24. 2 units transfused on 7/30 -Hemoglobin has responded appropriately to transfusion.   CAD -recent DES to RCA 04/12/15/Elevated troponin -Cardiology is following closely-->Plavix x 3 months at minimum -Suspect elevated troponin more reflective of demand ischemia from symptomatic anemia -Because of soft blood pressures we have been holding most of her cardiac medications including nitrates, Lasix and Diovan. Resume Lasix at a lower dose. -continue low-dose beta blocker with hold parameters  Chronic anticoagulation/S/P Mechanical AVR -Restarted warfarin 04/16/15. Started on IV heparin for bridging purposes by cardiology. INR is slowly climbing.  Diabetes mellitus type 2 -holding Invokana, Amaryl and Januvia. Continue SSI  -Follow CBGs  -Hemoglobin A1c 6.2  Chronic atrial fibrillation -Continue beta blocker and digoxin -CHADVASc = 6. On warfarin as discussed above.  Acute renal failure superimposed on stage 3 chronic kidney disease -Mild azotemia likely related to GI bleeding. Now improved -Have been holding Lasix and valsartan. A 6 resumed at a lower dose today. Will need to be discharged on lower dose of Lasix. Possibly hold Valsartan for now. Her EF is normal. -Baseline creatinine 1.6-1.8. Creatinine is improved.  Essential HTN -Blood pressure stable -Echocardiogram 7/19 with normal LV function and mild LVH without mention of definitive diastolic dysfunction   Hyperlipidemia - lipitor can be resumed at discharge  DVT prophylaxis: On anticoagulation CODE STATUS: Full code Family Communication:Discussed with patient Disposition Plan:Continue current management. Watch for further rectal bleeding.   Procedures/Studies: Dg Chest Portable 1 View  04/15/2015   CLINICAL DATA:  81 year old  presenting with melena and bright red blood per rectum.  Coronary stent placed 1 week ago, and patient placed on anticoagulation with Coumadin and Plavix. Patient complains of generalized weakness and chest pressure worse with exertion. Prior CABG and aortic valve replacement. Prior episodes of GI bleeding related to small bowel AVMs.  EXAM: PORTABLE CHEST - 1 VIEW  COMPARISON:  08/09/2010 and earlier.  FINDINGS: Sternotomy for CABG and aortic valve replacement. Cardiac silhouette upper normal in size for AP portable technique, unchanged. Lungs clear. Bronchovascular markings normal. Pulmonary vascularity normal. No visible pleural effusions. No pneumothorax.  IMPRESSION: Stable borderline heart size.  No acute cardiopulmonary disease.   Electronically Signed   By: Evangeline Dakin M.D.   On: 04/15/2015 12:11    Flexible sigmoidoscopy 7/30 ENDOSCOPIC IMPRESSION: 1. Small internal and external hemorrhoids 2. Otherwise normal sigmoidoscopy  Subjective: Patient without any complaints today.    Objective: Filed Vitals:   04/21/15 1736 04/21/15 1751 04/21/15 2135 04/22/15 0533  BP: 151/58 149/61 174/58 143/47  Pulse: 69 86 86 71  Temp: 98.1 F (36.7 C) 98.1 F (36.7 C) 98.7 F (37.1 C) 98 F (36.7 C)  TempSrc: Oral Oral Oral Oral  Resp: 18 18 18 18   Height:      Weight:    63.2 kg (139 lb 5.3 oz)  SpO2: 100% 100% 99% 99%    Intake/Output Summary (Last 24 hours) at 04/22/15 1117 Last data filed at 04/22/15 1033  Gross per 24 hour  Intake   1736 ml  Output   1450 ml  Net    286 ml   Weight change:   Exam:   General:  Pt is alert, not in acute distress  Cardiovascular: RRR, S1/S2, no rubs, no gallops  Respiratory: CTA bilaterally, no wheezing, no crackles, no rhonchi.  Abdomen: Soft/+BS, non tender, non distended, no guarding. No masses or organomegaly.  Alert and oriented 3. No focal neurological deficits are noted.   Data Reviewed: Basic Metabolic Panel:  Recent  Labs Lab 04/17/15 0347 04/18/15 0558 04/19/15 0506 04/20/15 0610 04/22/15 0529  NA 137 138 138 138 138  K 4.0 4.0 4.1 4.2 4.1  CL 107 111 110 108 110  CO2 24 22 23 23 22   GLUCOSE 100* 118* 122* 129* 122*  BUN 21* 19 18 13 13   CREATININE 1.58* 1.40* 1.29* 1.17* 1.26*  CALCIUM 8.5* 8.7* 8.7* 8.8* 8.8*   Liver Function Tests:  Recent Labs Lab 04/15/15 1145  AST 30  ALT 20  ALKPHOS 41  BILITOT 1.1  PROT 7.6  ALBUMIN 3.7    Recent Labs Lab 04/15/15 1145  LIPASE 35   CBC:  Recent Labs Lab 04/15/15 1145  04/19/15 1720 04/20/15 0610 04/20/15 1524 04/21/15 0504 04/22/15 0529  WBC 8.3  < > 5.1 5.3 6.8 4.5 4.9  NEUTROABS 5.8  --   --   --   --   --   --   HGB 9.9*  < > 8.8* 8.5* 9.5* 7.9* 10.5*  HCT 29.7*  < > 25.9* 25.2* 28.4* 23.8* 30.9*  MCV 93.7  < > 90.2 91.6 92.2 90.5 90.4  PLT 232  < > 171 179 209 190 188  < > = values in this interval not displayed.  Cardiac Enzymes:  Recent Labs Lab 04/15/15 1145  TROPONINI 0.55*   CBG:  Recent Labs Lab 04/20/15 2209 04/21/15 0803 04/21/15 1204 04/21/15 1634 04/22/15 0811  GLUCAP 133* 128* 121* 138* 140*    Recent Results (from the past 240 hour(s))  MRSA PCR Screening  Status: Abnormal   Collection Time: 04/15/15  4:19 PM  Result Value Ref Range Status   MRSA by PCR POSITIVE (A) NEGATIVE Final    Comment:        The GeneXpert MRSA Assay (FDA approved for NASAL specimens only), is one component of a comprehensive MRSA colonization surveillance program. It is not intended to diagnose MRSA infection nor to guide or monitor treatment for MRSA infections. RESULT CALLED TO, READ BACK BY AND VERIFIED WITH: HOOD,T RN 04/15/15 1750 WOOTEN,K      Scheduled Meds: . clopidogrel  75 mg Oral Daily  . digoxin  0.125 mg Oral Daily  . furosemide  40 mg Oral BID  . hydrocortisone  25 mg Rectal BID  . insulin aspart  0-9 Units Subcutaneous TID WC  . metoprolol tartrate  12.5 mg Oral BID  . pantoprazole   40 mg Oral Q0600  . sodium chloride  3 mL Intravenous Q12H  . warfarin  7.5 mg Oral ONCE-1800  . Warfarin - Pharmacist Dosing Inpatient   Does not apply q1800   Continuous Infusions: . heparin 1,000 Units/hr (04/22/15 [id number])     [facility] Pager [phone]  If 7PM-7AM, please contact night-coverage [email or web address] Password [email or web address] 04/22/2015, 11:17 AM   LOS: 7 days

## 2015-04-22 NOTE — Progress Notes (Signed)
Utilization Review completed. Grettel Rames RN BSN CM 

## 2015-04-22 NOTE — Progress Notes (Signed)
ANTICOAGULATION CONSULT NOTE - Follow Up Consult  Pharmacy Consult:  Heparin / Coumadin Indication:  Mechanical aortic valve  Allergies  Allergen Reactions  . Darvon [Propoxyphene] Nausea And Vomiting  . Lisinopril Cough  . Septra [Sulfamethoxazole-Trimethoprim]     Increased INR  . Warfarin And Related Other (See Comments)    Generic Warfarin -- pt states it makes her "crazy"  ONLY TOLERATES BRAND  . Penicillins Rash    Patient Measurements: Height: 5' (152.4 cm) Weight: 139 lb 5.3 oz (63.2 kg) IBW/kg (Calculated) : 45.5 Heparin dosing weight: 58kg  Vital Signs: Temp: 98 F (36.7 C) (07/31 0533) Temp Source: Oral (07/31 0533) BP: 143/47 mmHg (07/31 0533) Pulse Rate: 71 (07/31 0533)  Labs:  Recent Labs  04/20/15 0610 04/20/15 1227 04/20/15 1524 04/21/15 0504 04/22/15 0529  HGB 8.5*  --  9.5* 7.9* 10.5*  HCT 25.2*  --  28.4* 23.8* 30.9*  PLT 179  --  209 190 188  LABPROT 19.1*  --   --  20.8* 19.9*  INR 1.60*  --   --  1.79* 1.69*  HEPARINUNFRC 0.41 0.53  --  0.42 0.28*  CREATININE 1.17*  --   --   --  1.26*    Estimated Creatinine Clearance: 30.1 mL/min (by C-G formula based on Cr of 1.26).   Assessment: 35 YOF on Coumadin PTA for history of mechanical aortic valve. Patient presented with acute lower GIB.  S/p stent placement on 04/12/15 and patient was started on Plavix following DES placement.  Pharmacy consulted to dose warfarin for mechanical aortic valve and heparin bridge was later added. Bleeding reported on 7/28 and 7/29 thought to be due to hemorrhoids. No further bleeding reported. Hgb 10.5 s/p transfusion and plt wnl.  Heparin level 0.28 (subtherapeutic) and INR decreased slightly to 1.69.  PTA Warfarin Dosing: 2.5mg  Sat and 5mg  AODs   Goal of Therapy:  INR 2-3  Heparin level: 0.3-0.5 units/mL Monitor platelets by anticoagulation protocol: Yes    Plan:  - Increase heparin gtt to 1000 units/hr - Check Heparin level in morning - Warfarin 7.5mg   PO today - Daily HL / CBC / PT / INR  Darl Pikes, PharmD Clinical Pharmacist- Resident Pager: [phone]

## 2015-04-22 NOTE — Progress Notes (Signed)
Please see my note from yesterday, no new recommendations from cardiac standpoint. Will f/u GI recs today.   Zandra Abts MD

## 2015-04-22 NOTE — Progress Notes (Signed)
    Progress Note   Subjective   No complaints. No BMs or bleeding since before flex sig yesterday   Objective  Vital signs in last 24 hours: Temp:  [97.8 F (36.6 C)-98.7 F (37.1 C)] 98 F (36.7 C) (07/31 0533) Pulse Rate:  [69-86] 71 (07/31 0533) Resp:  [18-19] 18 (07/31 0533) BP: (139-174)/(45-61) 143/47 mmHg (07/31 0533) SpO2:  [99 %-100 %] 99 % (07/31 0533) Weight:  [139 lb 5.3 oz (63.2 kg)] 139 lb 5.3 oz (63.2 kg) (07/31 0533) Last BM Date: 04/21/15  General: Alert, well-developed,  in NAD Heart:  Regular rate and rhythm; no murmurs Chest: Clear to ascultation bilaterally Abdomen:  Soft, nontender and nondistended. Normal bowel sounds, without guarding, and without rebound.   Extremities:  Without edema. Neurologic:  Alert and  oriented x4; grossly normal neurologically. Psych:  Alert and cooperative. Normal mood and affect.  Intake/Output from previous day: 07/30 0701 - 07/31 0700 In: 1616 [P.O.:400; Blood:1216] Out: 1200 [Urine:1200] Intake/Output this shift: Total I/O In: 220 [P.O.:220] Out: 250 [Urine:250]  Lab Results:  Recent Labs  04/20/15 1524 04/21/15 0504 04/22/15 0529  WBC 6.8 4.5 4.9  HGB 9.5* 7.9* 10.5*  HCT 28.4* 23.8* 30.9*  PLT 209 190 188   BMET  Recent Labs  04/20/15 0610 04/22/15 0529  NA 138 138  K 4.2 4.1  CL 108 110  CO2 23 22  GLUCOSE 129* 122*  BUN 13 13  CREATININE 1.17* 1.26*  CALCIUM 8.8* 8.8*    PT/INR  Recent Labs  04/21/15 0504 04/22/15 0529  LABPROT 20.8* 19.9*  INR 1.79* 1.69*      Assessment & Plan   * Hematochezia.Now complicated by recent addition of Plavix to chronic Coumadin (subtherapeutic INR so on IV Heparin). No BM or bleeding since before flex sig yesterday. Small internal and external hemorrhoids on flex sig-not clear these are bleeding source. Concern for bleeding from SB AVMs on capsule endo in 2006 or might have proximal colon lesion. If bleeding stops no additional GI evaluation is  planned. If bleeding continues will follow Cardiology recommendations for colonoscopy while remaining on Plavix but off Coumadin and if that is negative consider repeat CE.   * Chronic Coumadin for mechanical AVR, PAF.   * S/p PCI/cardiac DES stent 7/21, started Plavix after this. [facility] input.   *  ABL anemia, Hb improved to 10.5 post transfusion.   Principal Problem:   Acute lower GI bleeding/known history AVMs Active Problems:   Chronic anticoagulation   Acute blood loss anemia   S/P AVR (aortic valve replacement)   CAD - last PCI was DES to RCA 04/12/15   DM (diabetes mellitus)   HTN (hypertension)   Hyperlipidemia   Elevated troponin   Acute renal failure superimposed on stage 3 chronic kidney disease   Chronic atrial fibrillation-CHADVASC = 6   GI bleed   Acute kidney injury   Anticoagulated   Troponin level elevated   Hematochezia   AVM (arteriovenous malformation) of small bowel, acquired   Hemorrhoids, internal, with bleeding    LOS: 7 days   Malcolm T. Fuller Plan  04/22/2015, 11:04 AM Pager [phone] 8a-5p weekdays Call [phone] weekends, holidays and 5p-8a or per Amion

## 2015-04-23 ENCOUNTER — Encounter (HOSPITAL_COMMUNITY): Payer: Self-pay | Admitting: Gastroenterology

## 2015-04-23 LAB — BASIC METABOLIC PANEL
Anion gap: 7 (ref 5–15)
BUN: 14 mg/dL (ref 6–20)
CALCIUM: 9.1 mg/dL (ref 8.9–10.3)
CO2: 20 mmol/L — ABNORMAL LOW (ref 22–32)
Chloride: 110 mmol/L (ref 101–111)
Creatinine, Ser: 1.23 mg/dL — ABNORMAL HIGH (ref 0.44–1.00)
GFR calc non Af Amer: 41 mL/min — ABNORMAL LOW (ref >60)
GFR, EST AFRICAN AMERICAN: 47 mL/min — AB (ref >60)
GLUCOSE: 137 mg/dL — AB (ref 65–99)
POTASSIUM: 4.3 mmol/L (ref 3.5–5.1)
Sodium: 137 mmol/L (ref 135–145)

## 2015-04-23 LAB — CBC
HCT: 32.4 % — ABNORMAL LOW (ref 36.0–46.0)
Hemoglobin: 11 g/dL — ABNORMAL LOW (ref 12.0–15.0)
MCH: 30.4 pg (ref 26.0–34.0)
MCHC: 34 g/dL (ref 30.0–36.0)
MCV: 89.5 fL (ref 78.0–100.0)
PLATELETS: 194 10*3/uL (ref 150–400)
RBC: 3.62 MIL/uL — ABNORMAL LOW (ref 3.87–5.11)
RDW: 14.6 % (ref 11.5–15.5)
WBC: 4.4 10*3/uL (ref 4.0–10.5)

## 2015-04-23 LAB — GLUCOSE, CAPILLARY
GLUCOSE-CAPILLARY: 131 mg/dL — AB (ref 65–99)
Glucose-Capillary: 130 mg/dL — ABNORMAL HIGH (ref 65–99)
Glucose-Capillary: 98 mg/dL (ref 65–99)
Glucose-Capillary: 142 mg/dL — ABNORMAL HIGH (ref 65–99)

## 2015-04-23 LAB — HEPARIN LEVEL (UNFRACTIONATED): Heparin Unfractionated: 0.35 IU/mL (ref 0.30–0.70)

## 2015-04-23 LAB — PROTIME-INR
INR: 1.75 — AB (ref 0.00–1.49)
Prothrombin Time: 20.4 seconds — ABNORMAL HIGH (ref 11.6–15.2)

## 2015-04-23 MED ORDER — WARFARIN SODIUM 7.5 MG PO TABS
7.5000 mg | ORAL_TABLET | Freq: Once | ORAL | Status: AC
  Administered 2015-04-23: 7.5 mg via ORAL
  Filled 2015-04-23: qty 1

## 2015-04-23 MED ORDER — ISOSORBIDE MONONITRATE ER 60 MG PO TB24
60.0000 mg | ORAL_TABLET | Freq: Every day | ORAL | Status: DC
  Administered 2015-04-23 – 2015-04-25 (×3): 60 mg via ORAL
  Filled 2015-04-23 (×3): qty 1

## 2015-04-23 NOTE — Progress Notes (Signed)
Patient Name: [name] Date of Encounter: 04/23/2015   SUBJECTIVE  Feels well. Denies chest pain, sob, palpitation or rectal bleeding.   CURRENT MEDS . clopidogrel  75 mg Oral Daily  . digoxin  0.125 mg Oral Daily  . furosemide  40 mg Oral BID  . hydrocortisone  25 mg Rectal BID  . insulin aspart  0-9 Units Subcutaneous TID WC  . metoprolol tartrate  12.5 mg Oral BID  . pantoprazole  40 mg Oral Q0600  . sodium chloride  3 mL Intravenous Q12H  . warfarin  7.5 mg Oral ONCE-1800  . Warfarin - Pharmacist Dosing Inpatient   Does not apply q1800    OBJECTIVE  Filed Vitals:   04/22/15 1521 04/22/15 2207 04/23/15 0556 04/23/15 0933  BP: 157/48 166/57 139/53 148/45  Pulse: 71 68 64 63  Temp: 98.4 F (36.9 C) 98 F (36.7 C) 98 F (36.7 C)   TempSrc: Oral Oral Oral   Resp: 20 16 16    Height:      Weight:   135 lb 9.3 oz (61.5 kg)   SpO2: 99% 97% 99%     Intake/Output Summary (Last 24 hours) at 04/23/15 1219 Last data filed at 04/23/15 [id number]  Gross per 24 hour  Intake    440 ml  Output   2200 ml  Net  -1760 ml   Filed Weights   04/20/15 0555 04/22/15 0533 04/23/15 0556  Weight: 137 lb 12.6 oz (62.5 kg) 139 lb 5.3 oz (63.2 kg) 135 lb 9.3 oz (61.5 kg)    PHYSICAL EXAM  General: Pleasant, NAD. Neuro: Alert and oriented X 3. Moves all extremities spontaneously. Psych: Normal affect. HEENT:  Normal  Neck: Supple without bruits or JVD. Lungs:  Resp regular and unlabored, CTA. Heart: RRR no s3, s4. Crisp s2.  Abdomen: Soft, non-tender, non-distended, BS + x 4.  Extremities: No clubbing, cyanosis or edema. DP/PT/Radials 2+ and equal bilaterally.  Accessory Clinical Findings  CBC  Recent Labs  04/22/15 0529 04/23/15 0538  WBC 4.9 4.4  HGB 10.5* 11.0*  HCT 30.9* 32.4*  MCV 90.4 89.5  PLT 188 [id number]   Basic Metabolic Panel  Recent Labs  04/22/15 0529 04/23/15 0538  NA 138 137  K 4.1 4.3  CL 110 110  CO2 22 20*  GLUCOSE 122* 137*  BUN 13 14    CREATININE 1.26* 1.23*  CALCIUM 8.8* 9.1    TELE  NSR  Radiology/Studies  Dg Chest Portable 1 View  04/15/2015   CLINICAL DATA:  81 year old presenting with melena and bright red blood per rectum. Coronary stent placed 1 week ago, and patient placed on anticoagulation with Coumadin and Plavix. Patient complains of generalized weakness and chest pressure worse with exertion. Prior CABG and aortic valve replacement. Prior episodes of GI bleeding related to small bowel AVMs.  EXAM: PORTABLE CHEST - 1 VIEW  COMPARISON:  08/09/2010 and earlier.  FINDINGS: Sternotomy for CABG and aortic valve replacement. Cardiac silhouette upper normal in size for AP portable technique, unchanged. Lungs clear. Bronchovascular markings normal. Pulmonary vascularity normal. No visible pleural effusions. No pneumothorax.  IMPRESSION: Stable borderline heart size.  No acute cardiopulmonary disease.   Electronically Signed   By: Evangeline Dakin M.D.   On: 04/15/2015 12:11    ASSESSMENT AND PLAN  1) CAD: s/p PCI to RCA with Synergy DES 7/21. Continue Plavix at this time. She has done well with Plavix at the time of her prior PCI.  2) AVR: Mechanical. Warfarin restarted 04/16/15. On IV heparin at this time for bridging.  INR target 2.0-3.0. Ideally, would stay at the lower end of the range. Once INR above 2, would stop heparin.  3) Anemia/ GI bleed:  Stopped bleeding. Per GI, ok to use blood thinners and try to get her to the minimum antiplatelet and anticoagulant.   4) AFIB: Warfarin   - From a cardiac standpoint, she will need to go home on clopidogrel and Coumadin. Would not use aspirin in her case due to the bleeding risk. Will sign off, call with questions.   Signed, Leanor Kail PA-C Pager [phone]  Personally seen and examined. Agree with above. No further GI bleeding. Dr. Ardis Hughs note reviewed.  Coumadin and Plavix (DES) NO ASA  Candee Furbish, MD

## 2015-04-23 NOTE — Progress Notes (Signed)
ANTICOAGULATION CONSULT NOTE - Follow Up Consult  Pharmacy Consult:  Heparin / Coumadin Indication:  Mechanical aortic valve  Allergies  Allergen Reactions  . Darvon [Propoxyphene] Nausea And Vomiting  . Lisinopril Cough  . Septra [Sulfamethoxazole-Trimethoprim]     Increased INR  . Warfarin And Related Other (See Comments)    Generic Warfarin -- pt states it makes her "crazy"  ONLY TOLERATES BRAND  . Penicillins Rash    Patient Measurements: Height: 5' (152.4 cm) Weight: 135 lb 9.3 oz (61.5 kg) IBW/kg (Calculated) : 45.5 Heparin dosing weight: 58kg  Vital Signs: Temp: 98 F (36.7 C) (08/01 0556) Temp Source: Oral (08/01 0556) BP: 148/45 mmHg (08/01 0933) Pulse Rate: 63 (08/01 0933)  Labs:  Recent Labs  04/21/15 0504 04/22/15 0529 04/23/15 0538  HGB 7.9* 10.5* 11.0*  HCT 23.8* 30.9* 32.4*  PLT 190 188 194  LABPROT 20.8* 19.9* 20.4*  INR 1.79* 1.69* 1.75*  HEPARINUNFRC 0.42 0.28* 0.35  CREATININE  --  1.26* 1.23*    Estimated Creatinine Clearance: 30.4 mL/min (by C-G formula based on Cr of 1.23).   Assessment: 73 YOF on Coumadin PTA for history of mechanical aortic valve who presented with acute lower GIB.  She is s/p stent placement on 04/12/15 and was started on Plavix following DES placement. Bleeding reported on 7/28 and 7/29 thought to be due to hemorrhoids. No further bleeding reported. GI has cleared her to remain on what anticoagulants and antiplatelets are needed for her cardiology care. INR remains low at 1.75 this morning. She continues on heparin IV- her heparin level is therapeutic at 0.35unit/mL.  Hgb up to 11, plts 194.  PTA Warfarin Dosing: 5mg  daily except 2.5mg  on Saturdays  Goal of Therapy:  INR 2-3  Heparin level: 0.3-0.5 units/mL Monitor platelets by anticoagulation protocol: Yes   Plan:  - Continue heparin gtt at 1000 units/hr - Warfarin 7.5mg  PO x1 today - Daily HL / CBC / INR - follow for s/s bleeding   Tonie Elsey D. Shereena Berquist,  PharmD, BCPS Clinical Pharmacist Pager: [phone] 04/23/2015 10:02 AM

## 2015-04-23 NOTE — Progress Notes (Signed)
PROGRESS NOTE  [name] [id number] DOB: 12-13-1932 DOA: 04/15/2015 PCP: Simona Huh, MD   Brief history 81 year old female with a history of diabetes mellitus, hypertension, dyslipidemia, atrial fibrillation and mechanical aortic valve replacement on warfarin, recurrent GI bleeding and setting of AVMs, known CAD with previous bare metal stents and recently his charge 7/22 after undergoing placement of DES to the RCA 7/21. She was started on Plavix. Patient presented to the ER with rectal bleeding. Episode was associated with chest tightness and shortness of breath. The patient's hemoglobin was 9.9 at the time of admission. She was transfused 1 unit of PRBC. Cardiology and gastroenterology were consulted. Initially, the patient's warfarin and Plavix were discontinued. However, the patient was restarted on her Coumadin and Plavix on 04/16/2015 with close monitoring. She had another episode of rectal bleeding on 7/28, 7/29. She underwent flexible sigmoidoscopy July 30. No bleeding source was identified.  Assessment/Plan:  Acute lower GI bleeding/known history SB AVMs -Did not have any rectal bleeding yesterday. She has had recurrent rectal bleeding over the summer this hospitalization. None today so far. Patient underwent flexible sigmoidoscopy 7/30 which did not reveal any source of active bleeding. Gastroenterology was considering a colonoscopy. However, now that she hasn't had any significant bleeding in the last 48 hours there are no plans for same. Please see Dr. Nelly Laurence note from 7/30 outlining recommendations regarding these agents if the patient needs to undergo colonoscopy. If, however, bleeding recurs or is significant in amount we may need to proceed with colonoscopy. -Patient was previously started on Anusol due to concerns for hemorrhoidal bleeding -Restarted Plavix and Coumadin 7/25 -In the past, these bleeds have been self-limiting in nature -Colonoscopy was  negative in 01/2013 and enteroscopy showed gastritis in 01/2013.   Chronic blood loss anemia -one unit PRBC transfused on 7/24. 2 units transfused on 7/30 -Hemoglobin has responded appropriately to transfusion.   CAD -recent DES to RCA 04/12/15/Elevated troponin -Cardiology is following closely-->Plavix x 3 months at minimum -Suspect elevated troponin more reflective of demand ischemia from symptomatic anemia -Because of soft blood pressures we have been holding most of her cardiac medications including nitrates, Lasix and Diovan. Resumed Lasix at a lower dose. Okay to resume nitrates as well. -continue low-dose beta blocker with hold parameters  Chronic anticoagulation/S/P Mechanical AVR -Restarted warfarin 04/16/15. Started on IV heparin for bridging purposes by cardiology. INR is slowly climbing.  Diabetes mellitus type 2 -holding Invokana, Amaryl and Januvia. Continue SSI  -Follow CBGs  -Hemoglobin A1c 6.2  Chronic atrial fibrillation -Continue beta blocker and digoxin -CHADVASc = 6. On warfarin as discussed above.  Acute renal failure superimposed on stage 3 chronic kidney disease -Mild azotemia likely related to GI bleeding. Now improved -Have been holding Lasix and valsartan. Lasix resumed at a lower dose. Will need to be discharged on lower dose of Lasix. Possibly hold Valsartan for now. Her EF is normal. -Baseline creatinine 1.6-1.8. Creatinine is improved.  Essential HTN -Blood pressure stable and now in the hypertensive range -Echocardiogram 7/19 with normal LV function and mild LVH without mention of definitive diastolic dysfunction   Hyperlipidemia - lipitor can be resumed at discharge  DVT prophylaxis: On anticoagulation CODE STATUS: Full code Family Communication:Discussed with patient Disposition Plan:Continue current management. Watch for further rectal bleeding. Could be discharged once INR is 2 or greater as long as there is no further  bleeding.   Procedures/Studies: Dg Chest Portable 1 View  04/15/2015  CLINICAL DATA:  80 year old presenting with melena and bright red blood per rectum. Coronary stent placed 1 week ago, and patient placed on anticoagulation with Coumadin and Plavix. Patient complains of generalized weakness and chest pressure worse with exertion. Prior CABG and aortic valve replacement. Prior episodes of GI bleeding related to small bowel AVMs.  EXAM: PORTABLE CHEST - 1 VIEW  COMPARISON:  08/09/2010 and earlier.  FINDINGS: Sternotomy for CABG and aortic valve replacement. Cardiac silhouette upper normal in size for AP portable technique, unchanged. Lungs clear. Bronchovascular markings normal. Pulmonary vascularity normal. No visible pleural effusions. No pneumothorax.  IMPRESSION: Stable borderline heart size.  No acute cardiopulmonary disease.   Electronically Signed   By: Evangeline Dakin M.D.   On: 04/15/2015 12:11    Flexible sigmoidoscopy 7/30 ENDOSCOPIC IMPRESSION: 1. Small internal and external hemorrhoids 2. Otherwise normal sigmoidoscopy  Subjective: Patient denies any complaints. No chest pain or shortness of breath.   Objective: Filed Vitals:   04/22/15 1521 04/22/15 2207 04/23/15 0556 04/23/15 0933  BP: 157/48 166/57 139/53 148/45  Pulse: 71 68 64 63  Temp: 98.4 F (36.9 C) 98 F (36.7 C) 98 F (36.7 C)   TempSrc: Oral Oral Oral   Resp: 20 16 16    Height:      Weight:   61.5 kg (135 lb 9.3 oz)   SpO2: 99% 97% 99%     Intake/Output Summary (Last 24 hours) at 04/23/15 1218 Last data filed at 04/23/15 [id number]  Gross per 24 hour  Intake    440 ml  Output   2200 ml  Net  -1760 ml   Weight change: -1.7 kg (-3 lb 12 oz)  Exam:   General:  Pt is alert, not in acute distress  Cardiovascular: RRR, S1/S2, no rubs, no gallops. Mechanical sound heard.  Respiratory: CTA bilaterally, no wheezing, no crackles, no rhonchi.  Abdomen: Soft/+BS, non tender, non distended, no guarding. No  masses or organomegaly.  Alert and oriented 3. No focal neurological deficits are noted.   Data Reviewed: Basic Metabolic Panel:  Recent Labs Lab 04/18/15 0558 04/19/15 0506 04/20/15 0610 04/22/15 0529 04/23/15 0538  NA 138 138 138 138 137  K 4.0 4.1 4.2 4.1 4.3  CL 111 110 108 110 110  CO2 22 23 23 22  20*  GLUCOSE 118* 122* 129* 122* 137*  BUN 19 18 13 13 14   CREATININE 1.40* 1.29* 1.17* 1.26* 1.23*  CALCIUM 8.7* 8.7* 8.8* 8.8* 9.1   CBC:  Recent Labs Lab 04/20/15 0610 04/20/15 1524 04/21/15 0504 04/22/15 0529 04/23/15 0538  WBC 5.3 6.8 4.5 4.9 4.4  HGB 8.5* 9.5* 7.9* 10.5* 11.0*  HCT 25.2* 28.4* 23.8* 30.9* 32.4*  MCV 91.6 92.2 90.5 90.4 89.5  PLT 179 209 190 188 194   CBG:  Recent Labs Lab 04/22/15 0811 04/22/15 1146 04/22/15 1658 04/22/15 2136 04/23/15 0753  GLUCAP 140* 101* 121* 136* 130*    Recent Results (from the past 240 hour(s))  MRSA PCR Screening     Status: Abnormal   Collection Time: 04/15/15  4:19 PM  Result Value Ref Range Status   MRSA by PCR POSITIVE (A) NEGATIVE Final    Comment:        The GeneXpert MRSA Assay (FDA approved for NASAL specimens only), is one component of a comprehensive MRSA colonization surveillance program. It is not intended to diagnose MRSA infection nor to guide or monitor treatment for MRSA infections. RESULT CALLED TO, READ BACK BY AND VERIFIED WITH: HOOD,T  RN 04/15/15 1750 WOOTEN,K      Scheduled Meds: . clopidogrel  75 mg Oral Daily  . digoxin  0.125 mg Oral Daily  . furosemide  40 mg Oral BID  . hydrocortisone  25 mg Rectal BID  . insulin aspart  0-9 Units Subcutaneous TID WC  . metoprolol tartrate  12.5 mg Oral BID  . pantoprazole  40 mg Oral Q0600  . sodium chloride  3 mL Intravenous Q12H  . warfarin  7.5 mg Oral ONCE-1800  . Warfarin - Pharmacist Dosing Inpatient   Does not apply q1800   Continuous Infusions: . heparin 1,000 Units/hr (04/23/15 0027)     [facility] Pager [phone]  If 7PM-7AM, please contact night-coverage [email or web address] Password [email or web address] 04/23/2015, 12:18 PM   LOS: 8 days

## 2015-04-23 NOTE — Care Management Important Message (Signed)
Important Message  Patient Details  Name: KEARSTON KOHN MRN: [id number] Date of Birth: 1932/11/03   Medicare Important Message Given:  Yes-fourth notification given Fifth notice     Carles Collet, RN 04/23/2015, 11:53 AMImportant Message  Patient Details  Name: BIRDIE CLUNE MRN: [id number] Date of Birth: 01-02-1933   Medicare Important Message Given:  Yes-fourth notification given    Carles Collet, RN 04/23/2015, 11:53 AM

## 2015-04-23 NOTE — Progress Notes (Signed)
[facility] Progress Note    Since last GI note: No overt bleeding in 1-2 days now: no BM overnight.  She feels very well.  Objective: Vital signs in last 24 hours: Temp:  [98 F (36.7 C)-98.4 F (36.9 C)] 98 F (36.7 C) (08/01 0556) Pulse Rate:  [64-71] 64 (08/01 0556) Resp:  [16-20] 16 (08/01 0556) BP: (139-166)/(48-57) 139/53 mmHg (08/01 0556) SpO2:  [97 %-99 %] 99 % (08/01 0556) Weight:  [135 lb 9.3 oz (61.5 kg)] 135 lb 9.3 oz (61.5 kg) (08/01 0556) Last BM Date: 04/21/15 General: alert and oriented times 3 Heart: regular rate and rythm Abdomen: soft, non-tender, non-distended, normal bowel sounds   Lab Results:  Recent Labs  04/21/15 0504 04/22/15 0529 04/23/15 0538  WBC 4.5 4.9 4.4  HGB 7.9* 10.5* 11.0*  PLT 190 188 194  MCV 90.5 90.4 89.5    Recent Labs  04/22/15 0529 04/23/15 0538  NA 138 137  K 4.1 4.3  CL 110 110  CO2 22 20*  GLUCOSE 122* 137*  BUN 13 14  CREATININE 1.26* 1.23*  CALCIUM 8.8* 9.1   No results for input(s): PROT, ALBUMIN, AST, ALT, ALKPHOS, BILITOT, BILIDIR, IBILI in the last 72 hours.  Recent Labs  04/22/15 0529 04/23/15 0538  INR 1.69* 1.75*   Medications: Scheduled Meds: . clopidogrel  75 mg Oral Daily  . digoxin  0.125 mg Oral Daily  . furosemide  40 mg Oral BID  . hydrocortisone  25 mg Rectal BID  . insulin aspart  0-9 Units Subcutaneous TID WC  . metoprolol tartrate  12.5 mg Oral BID  . pantoprazole  40 mg Oral Q0600  . sodium chloride  3 mL Intravenous Q12H  . Warfarin - Pharmacist Dosing Inpatient   Does not apply q1800   Continuous Infusions: . heparin 1,000 Units/hr (04/23/15 0027)   PRN Meds:.ondansetron **OR** ondansetron (ZOFRAN) IV, zolpidem    Assessment/Plan: 81 y.o. female with overt obscure GI bleeding, seems to have resolved  She understands that combo therapy with coumadin, plavix likely contributes to her gi bleeding.  Most likely she has SB AVMs with can be very difficult to  pinpoint and even more difficult to treat.  Fortunately she has stopped bleeding. OK to use whatever blood thinners are needed for her cardiology but would try to get her to the minimum antiplatelet, blood thinning regimen as soon as possible. Please call if she has significant overt GI bleeding again.    Milus Banister, MD  04/23/2015, 8:27 AM [facility] Pager [phone]

## 2015-04-24 LAB — CBC
HEMATOCRIT: 33.8 % — AB (ref 36.0–46.0)
Hemoglobin: 11.6 g/dL — ABNORMAL LOW (ref 12.0–15.0)
MCH: 30.9 pg (ref 26.0–34.0)
MCHC: 34.3 g/dL (ref 30.0–36.0)
MCV: 89.9 fL (ref 78.0–100.0)
Platelets: 211 10*3/uL (ref 150–400)
RBC: 3.76 MIL/uL — AB (ref 3.87–5.11)
RDW: 14.7 % (ref 11.5–15.5)
WBC: 5.8 10*3/uL (ref 4.0–10.5)

## 2015-04-24 LAB — BASIC METABOLIC PANEL
Anion gap: 10 (ref 5–15)
BUN: 25 mg/dL — ABNORMAL HIGH (ref 6–20)
CALCIUM: 9.2 mg/dL (ref 8.9–10.3)
CHLORIDE: 103 mmol/L (ref 101–111)
CO2: 22 mmol/L (ref 22–32)
CREATININE: 1.52 mg/dL — AB (ref 0.44–1.00)
GFR, EST AFRICAN AMERICAN: 36 mL/min — AB (ref >60)
GFR, EST NON AFRICAN AMERICAN: 31 mL/min — AB (ref >60)
GLUCOSE: 117 mg/dL — AB (ref 65–99)
Potassium: 3.8 mmol/L (ref 3.5–5.1)
Sodium: 135 mmol/L (ref 135–145)

## 2015-04-24 LAB — PROTIME-INR
INR: 1.99 — ABNORMAL HIGH (ref 0.00–1.49)
PROTHROMBIN TIME: 22.5 s — AB (ref 11.6–15.2)

## 2015-04-24 LAB — HEPARIN LEVEL (UNFRACTIONATED): HEPARIN UNFRACTIONATED: 0.43 [IU]/mL (ref 0.30–0.70)

## 2015-04-24 LAB — GLUCOSE, CAPILLARY
Glucose-Capillary: 124 mg/dL — ABNORMAL HIGH (ref 65–99)
Glucose-Capillary: 118 mg/dL — ABNORMAL HIGH (ref 65–99)
Glucose-Capillary: 134 mg/dL — ABNORMAL HIGH (ref 65–99)
Glucose-Capillary: 139 mg/dL — ABNORMAL HIGH (ref 65–99)

## 2015-04-24 MED ORDER — WARFARIN SODIUM 5 MG PO TABS
5.0000 mg | ORAL_TABLET | Freq: Once | ORAL | Status: AC
  Administered 2015-04-24: 5 mg via ORAL
  Filled 2015-04-24: qty 1

## 2015-04-24 MED ORDER — FUROSEMIDE 40 MG PO TABS
40.0000 mg | ORAL_TABLET | Freq: Every day | ORAL | Status: DC
  Administered 2015-04-25: 40 mg via ORAL
  Filled 2015-04-24: qty 1

## 2015-04-24 MED ORDER — DOCUSATE SODIUM 100 MG PO CAPS
100.0000 mg | ORAL_CAPSULE | Freq: Every day | ORAL | Status: DC
  Administered 2015-04-24: 100 mg via ORAL
  Filled 2015-04-24: qty 1

## 2015-04-24 NOTE — Progress Notes (Signed)
PROGRESS NOTE  [name] [id number] DOB: Sep 11, 1933 DOA: 04/15/2015 PCP: Simona Huh, MD   Brief history 81 year old female with a history of diabetes mellitus, hypertension, dyslipidemia, atrial fibrillation and mechanical aortic valve replacement on warfarin, recurrent GI bleeding and setting of AVMs, known CAD with previous bare metal stents and recently his charge 7/22 after undergoing placement of DES to the RCA 7/21. She was started on Plavix. Patient presented to the ER with rectal bleeding. Episode was associated with chest tightness and shortness of breath. The patient's hemoglobin was 9.9 at the time of admission. She was transfused 1 unit of PRBC. Cardiology and gastroenterology were consulted. Initially, the patient's warfarin and Plavix were discontinued. However, the patient was restarted on her Coumadin and Plavix on 04/16/2015 with close monitoring. She had another episode of rectal bleeding on 7/28, 7/29. She underwent flexible sigmoidoscopy July 30. No bleeding source was identified. Patient was transfused 2 additional units on 7/30. Bleeding appears to have subsided.  Assessment/Plan:  Acute lower GI bleeding/known history SB AVMs -Has not had any rectal bleeding for the last 72 hours. She has had recurrent rectal bleeding over the summer this hospitalization. Patient underwent flexible sigmoidoscopy 7/30 which did not reveal any source of active bleeding. Gastroenterology was considering a colonoscopy. However, now that she hasn't had any significant bleeding in the last 72 hours there are no plans for same. Please see Dr. Nelly Laurence note from 7/30 outlining recommendations regarding her anticoagulation as well as antiplatelets agents if the patient needs to undergo colonoscopy. If, however, bleeding recurs or is significant in amount we may need to proceed with colonoscopy. -Patient was previously started on Anusol due to concerns for hemorrhoidal  bleeding -Restarted Plavix and Coumadin 7/25 -In the past, these bleeds have been self-limiting in nature -Colonoscopy was negative in 01/2013 and enteroscopy showed gastritis in 01/2013.   Chronic blood loss anemia -one unit PRBC transfused on 7/24. 2 units transfused on 7/30 -Hemoglobin has responded appropriately to transfusion.   CAD -recent DES to RCA 04/12/15/Elevated troponin -Cardiology is following closely-->Plavix x 3 months at minimum -Suspect elevated troponin more reflective of demand ischemia from symptomatic anemia -Because of soft blood pressures we have been holding most of her cardiac medications including nitrates, Lasix and Diovan. Resumed Lasix at a lower dose. Resumed Nitrates as well.  -continue low-dose beta blocker with hold parameters  Chronic anticoagulation/S/P Mechanical AVR -Restarted warfarin 04/16/15. Started on IV heparin for bridging purposes by cardiology. INR is almost therapeutic.  Diabetes mellitus type 2 -holding Invokana, Amaryl and Januvia. Continue SSI  -Follow CBGs  -Hemoglobin A1c 6.2  Chronic atrial fibrillation -Continue beta blocker and digoxin -CHADVASc = 6. On warfarin as discussed above.  Acute renal failure superimposed on stage 3 chronic kidney disease -Mild azotemia likely related to GI bleeding. Now improved -Have been holding Lasix and valsartan. Lasix was resumed at a lower dose. There is a bump in her creatinine today. We'll cut back on the dose of Lasix. Interestingly, she is on a significantly higher dose of Lasix at home. Will not resume valsartan. Repeat renal function tomorrow morning. If stable, don't anticipate further workup. Her EF is normal. -Baseline creatinine 1.6-1.8. Creatinine is improved.  Essential HTN -Blood pressure  -Echocardiogram 7/19 with normal LV function and mild LVH without mention of definitive diastolic dysfunction   Hyperlipidemia - lipitor can be resumed at discharge  DVT prophylaxis: On  anticoagulation CODE STATUS: Full code  Family Communication:Discussed with patient Disposition Plan:Continue current management. Watch for further rectal bleeding. Since there is a bump in her creatinine we will observe her 1 additional day. Dose of Lasix has been reduced. If INR is therapeutic tomorrow and renal function is stable, she could be discharged.    Procedures/Studies: Dg Chest Portable 1 View  04/15/2015   CLINICAL DATA:  81 year old presenting with melena and bright red blood per rectum. Coronary stent placed 1 week ago, and patient placed on anticoagulation with Coumadin and Plavix. Patient complains of generalized weakness and chest pressure worse with exertion. Prior CABG and aortic valve replacement. Prior episodes of GI bleeding related to small bowel AVMs.  EXAM: PORTABLE CHEST - 1 VIEW  COMPARISON:  08/09/2010 and earlier.  FINDINGS: Sternotomy for CABG and aortic valve replacement. Cardiac silhouette upper normal in size for AP portable technique, unchanged. Lungs clear. Bronchovascular markings normal. Pulmonary vascularity normal. No visible pleural effusions. No pneumothorax.  IMPRESSION: Stable borderline heart size.  No acute cardiopulmonary disease.   Electronically Signed   By: Evangeline Dakin M.D.   On: 04/15/2015 12:11    Flexible sigmoidoscopy 7/30 ENDOSCOPIC IMPRESSION: 1. Small internal and external hemorrhoids 2. Otherwise normal sigmoidoscopy  Subjective: Patient feels well. No complaints offered.    Objective: Filed Vitals:   04/23/15 1445 04/23/15 2109 04/24/15 0546 04/24/15 1000  BP: 137/65 132/54 121/59 116/60  Pulse: 69 75 75 88  Temp: 98.6 F (37 C) 97.7 F (36.5 C) 97.7 F (36.5 C)   TempSrc: Oral Oral Oral   Resp: 22 18 18    Height:      Weight:   60.3 kg (132 lb 15 oz)   SpO2: 100% 100% 98%     Intake/Output Summary (Last 24 hours) at 04/24/15 1046 Last data filed at 04/24/15 1005  Gross per 24 hour  Intake    680 ml  Output    2750 ml  Net  -2070 ml   Weight change: -1.2 kg (-2 lb 10.3 oz)  Exam:   General:  Pt is alert, not in acute distress  Cardiovascular: RRR, S1/S2, no rubs, no gallops. Mechanical sound heard.  Respiratory: CTA bilaterally, no wheezing, no crackles, no rhonchi.  Abdomen: Soft/+BS, non tender, non distended, no guarding. No masses or organomegaly.  Alert and oriented 3. No focal neurological deficits are noted.   Data Reviewed: Basic Metabolic Panel:  Recent Labs Lab 04/19/15 0506 04/20/15 0610 04/22/15 0529 04/23/15 0538 04/24/15 0611  NA 138 138 138 137 135  K 4.1 4.2 4.1 4.3 3.8  CL 110 108 110 110 103  CO2 23 23 22  20* 22  GLUCOSE 122* 129* 122* 137* 117*  BUN 18 13 13 14  25*  CREATININE 1.29* 1.17* 1.26* 1.23* 1.52*  CALCIUM 8.7* 8.8* 8.8* 9.1 9.2   CBC:  Recent Labs Lab 04/20/15 1524 04/21/15 0504 04/22/15 0529 04/23/15 0538 04/24/15 0611  WBC 6.8 4.5 4.9 4.4 5.8  HGB 9.5* 7.9* 10.5* 11.0* 11.6*  HCT 28.4* 23.8* 30.9* 32.4* 33.8*  MCV 92.2 90.5 90.4 89.5 89.9  PLT 209 190 188 194 211   CBG:  Recent Labs Lab 04/23/15 0753 04/23/15 1233 04/23/15 1723 04/23/15 2109 04/24/15 0833  GLUCAP 130* 98 131* 142* 124*    Recent Results (from the past 240 hour(s))  MRSA PCR Screening     Status: Abnormal   Collection Time: 04/15/15  4:19 PM  Result Value Ref Range Status   MRSA by PCR POSITIVE (A) NEGATIVE Final  Comment:        The GeneXpert MRSA Assay (FDA approved for NASAL specimens only), is one component of a comprehensive MRSA colonization surveillance program. It is not intended to diagnose MRSA infection nor to guide or monitor treatment for MRSA infections. RESULT CALLED TO, READ BACK BY AND VERIFIED WITH: HOOD,T RN 04/15/15 1750 WOOTEN,K      Scheduled Meds: . clopidogrel  75 mg Oral Daily  . digoxin  0.125 mg Oral Daily  . docusate sodium  100 mg Oral QHS  . [START ON 04/25/2015] furosemide  40 mg Oral Daily  .  hydrocortisone  25 mg Rectal BID  . insulin aspart  0-9 Units Subcutaneous TID WC  . isosorbide mononitrate  60 mg Oral Daily  . metoprolol tartrate  12.5 mg Oral BID  . pantoprazole  40 mg Oral Q0600  . sodium chloride  3 mL Intravenous Q12H  . warfarin  5 mg Oral ONCE-1800  . Warfarin - Pharmacist Dosing Inpatient   Does not apply q1800   Continuous Infusions: . heparin 1,000 Units/hr (04/23/15 0027)     [facility] Pager [phone]  If 7PM-7AM, please contact night-coverage [email or web address] Password [email or web address] 04/24/2015, 10:46 AM   LOS: 9 days

## 2015-04-24 NOTE — Progress Notes (Signed)
ANTICOAGULATION CONSULT NOTE - Follow Up Consult  Pharmacy Consult:  Heparin / Coumadin Indication:  Mechanical aortic valve  Allergies  Allergen Reactions  . Darvon [Propoxyphene] Nausea And Vomiting  . Lisinopril Cough  . Septra [Sulfamethoxazole-Trimethoprim]     Increased INR  . Warfarin And Related Other (See Comments)    Generic Warfarin -- pt states it makes her "crazy"  ONLY TOLERATES BRAND  . Penicillins Rash    Patient Measurements: Height: 5' (152.4 cm) Weight: 132 lb 15 oz (60.3 kg) IBW/kg (Calculated) : 45.5 Heparin dosing weight: 58kg  Vital Signs: Temp: 97.7 F (36.5 C) (08/02 0546) Temp Source: Oral (08/02 0546) BP: 121/59 mmHg (08/02 0546) Pulse Rate: 75 (08/02 0546)  Labs:  Recent Labs  04/22/15 0529 04/23/15 0538 04/24/15 0611  HGB 10.5* 11.0* 11.6*  HCT 30.9* 32.4* 33.8*  PLT 188 194 211  LABPROT 19.9* 20.4* 22.5*  INR 1.69* 1.75* 1.99*  HEPARINUNFRC 0.28* 0.35 0.43  CREATININE 1.26* 1.23* 1.52*    Estimated Creatinine Clearance: 24.4 mL/min (by C-G formula based on Cr of 1.52).   Assessment: 56 YOF on Coumadin PTA for history of mechanical aortic valve who presented with acute lower GIB.  She is s/p stent placement on 04/12/15 and was started on Plavix following DES placement. Bleeding reported on 7/28 and 7/29 thought to be due to hemorrhoids. No further bleeding reported. GI has cleared her to remain on what anticoagulants and antiplatelets are needed for her cardiology care.  INR 1.99 this morning. As it takes 24h until the full effects of warfarin are seen, I would expect her INR to continue to trend up as the day progresses in response to the dose she received at 1800 last evening.  She continues on heparin IV- her heparin level is therapeutic at 0.43unit/mL.  Hgb up to 11.6, plts 211  PTA Warfarin Dosing: 5mg  daily except 2.5mg  on Saturdays  Goal of Therapy:  INR 2-3  Heparin level: 0.3-0.5 units/mL Monitor platelets by  anticoagulation protocol: Yes   Plan:  - Continue heparin gtt at 1000 units/hr - Warfarin 5mg  po x1 tonight ordered - if patient is discharged today, can resume home dosing of 5mg  daily except 2.5mg  on Saturdays  - Daily HL / CBC / INR - follow for s/s bleeding   Elexius Minar D. Alacia Rehmann, PharmD, BCPS Clinical Pharmacist Pager: [phone] 04/24/2015 8:15 AM

## 2015-04-25 LAB — PROTIME-INR
INR: 1.98 — ABNORMAL HIGH (ref 0.00–1.49)
Prothrombin Time: 22.4 seconds — ABNORMAL HIGH (ref 11.6–15.2)

## 2015-04-25 LAB — GLUCOSE, CAPILLARY: Glucose-Capillary: 128 mg/dL — ABNORMAL HIGH (ref 65–99)

## 2015-04-25 LAB — CBC
HCT: 34.8 % — ABNORMAL LOW (ref 36.0–46.0)
Hemoglobin: 11.7 g/dL — ABNORMAL LOW (ref 12.0–15.0)
MCH: 30.5 pg (ref 26.0–34.0)
MCHC: 33.6 g/dL (ref 30.0–36.0)
MCV: 90.9 fL (ref 78.0–100.0)
PLATELETS: 206 10*3/uL (ref 150–400)
RBC: 3.83 MIL/uL — ABNORMAL LOW (ref 3.87–5.11)
RDW: 14.4 % (ref 11.5–15.5)
WBC: 5.2 10*3/uL (ref 4.0–10.5)

## 2015-04-25 LAB — HEPARIN LEVEL (UNFRACTIONATED): Heparin Unfractionated: 0.47 IU/mL (ref 0.30–0.70)

## 2015-04-25 LAB — BASIC METABOLIC PANEL
ANION GAP: 10 (ref 5–15)
BUN: 27 mg/dL — ABNORMAL HIGH (ref 6–20)
CHLORIDE: 106 mmol/L (ref 101–111)
CO2: 22 mmol/L (ref 22–32)
CREATININE: 1.42 mg/dL — AB (ref 0.44–1.00)
Calcium: 9.3 mg/dL (ref 8.9–10.3)
GFR calc Af Amer: 40 mL/min — ABNORMAL LOW (ref >60)
GFR calc non Af Amer: 34 mL/min — ABNORMAL LOW (ref >60)
Glucose, Bld: 124 mg/dL — ABNORMAL HIGH (ref 65–99)
Potassium: 3.8 mmol/L (ref 3.5–5.1)
SODIUM: 138 mmol/L (ref 135–145)

## 2015-04-25 MED ORDER — HYDROCORTISONE ACETATE 25 MG RE SUPP: 25.0000 mg | Freq: Two times a day (BID) | RECTAL | Status: DC | PRN

## 2015-04-25 MED ORDER — FUROSEMIDE 40 MG PO TABS: 40.0000 mg | ORAL_TABLET | Freq: Every day | ORAL | Status: DC

## 2015-04-25 MED ORDER — METOPROLOL TARTRATE 25 MG PO TABS: 12.5000 mg | ORAL_TABLET | Freq: Two times a day (BID) | ORAL | Status: DC

## 2015-04-25 NOTE — Care Management Note (Signed)
Case Management Note  Patient Details  Name: [name] MRN: [id number] Date of Birth: 09-19-1933  Subjective/Objective:                 Patient from home, lives with husband. Independent, self care. Patient states that she has a walker she does not use at home. No other needs identified.     Action/Plan:  Patient to discharge to home today; self care.   Expected Discharge Date:  04/19/15               Expected Discharge Plan:  Home/Self Care  In-House Referral:  Clinical Social Work  Discharge planning Services  CM Consult  Post Acute Care Choice:  NA Choice offered to:     DME Arranged:    DME Agency:     HH Arranged:    HH Agency:     Status of Service:  Completed, signed off  Medicare Important Message Given:  Yes-fourth notification given Date Medicare IM Given:    Medicare IM give by:    Date Additional Medicare IM Given:    Additional Medicare Important Message give by:     If discussed at [place] of Stay Meetings, dates discussed:    Additional Comments:  Carles Collet, RN 04/25/2015, 11:02 AM

## 2015-04-25 NOTE — Progress Notes (Signed)
Utilization Review completed. Elmor Kost RN BSN CM 

## 2015-04-25 NOTE — Discharge Summary (Signed)
[facility]  Physician Discharge Summary   Patient ID: [name] MRN: [id number] DOB/AGE: [id number] 79 y.o.  Admit date: 04/15/2015 Discharge date: 04/25/2015  PCP: Simona Huh, MD  DISCHARGE DIAGNOSES:  Principal Problem:   Acute lower GI bleeding/known history AVMs Active Problems:   Chronic anticoagulation   Acute blood loss anemia   S/P AVR (aortic valve replacement)   CAD - last PCI was DES to RCA 04/12/15   DM (diabetes mellitus)   HTN (hypertension)   Hyperlipidemia   Elevated troponin   Acute renal failure superimposed on stage 3 chronic kidney disease   Chronic atrial fibrillation-CHADVASC = 6   GI bleed   Acute kidney injury   Anticoagulated   Troponin level elevated   Hematochezia   AVM (arteriovenous malformation) of small bowel, acquired   Hemorrhoids, internal, with bleeding   RECOMMENDATIONS FOR OUTPATIENT FOLLOW UP: 1. Patient will have an appointment with cardiology for blood work and for follow-up. 2. Patient's ARB has been held. She is on a much lower dose of Lasix than before. This will need to be addressed based on her basic metabolic panel and clinical appearance at follow-up.   DISCHARGE CONDITION: fair  Diet recommendation: Heart healthy  Filed Weights   04/23/15 0556 04/24/15 0546 04/25/15 0541  Weight: 61.5 kg (135 lb 9.3 oz) 60.3 kg (132 lb 15 oz) 60.4 kg (133 lb 2.5 oz)    INITIAL HISTORY: 81 year old female with a history of diabetes mellitus, hypertension, dyslipidemia, atrial fibrillation and mechanical aortic valve replacement on warfarin, recurrent GI bleeding and setting of AVMs, known CAD with previous bare metal stents and recently his charge 7/22 after undergoing placement of DES to the RCA 7/21. She was started on Plavix. Patient presented to the ER with rectal bleeding. Episode was associated with chest tightness and shortness of breath. The patient's hemoglobin was 9.9 at the time of admission. She was  transfused 1 unit of PRBC. Cardiology and gastroenterology were consulted. Initially, the patient's warfarin and Plavix were discontinued. However, the patient was restarted on her Coumadin and Plavix on 04/16/2015 with close monitoring. She had another episode of rectal bleeding on 7/28, 7/29. She underwent flexible sigmoidoscopy July 30. No bleeding source was identified. Patient was transfused 2 additional units on 7/30. Bleeding appears to have subsided.  Consultations:  Cardiology  Gastroenterology  Procedures: Flexible sigmoidoscopy 7/30 ENDOSCOPIC IMPRESSION: 1. Small internal and external hemorrhoids 2. Otherwise normal sigmoidoscopy  HOSPITAL COURSE:   Acute lower GI bleeding/known history SB AVMs -Has not had any rectal bleeding for the last 4 dayss. She has had recurrent rectal bleeding over the course of this hospitalization. Patient underwent flexible sigmoidoscopy 7/30 which did not reveal any source of active bleeding. Gastroenterology was considering a colonoscopy. However, now that she hasn't had any significant bleeding in the last few days there was no need for one.  -Patient was previously started on Anusol due to concerns for hemorrhoidal bleeding -Restarted Plavix and Coumadin 7/25 -Colonoscopy was negative in 01/2013 and enteroscopy showed gastritis in 01/2013.   Chronic blood loss anemia -one unit PRBC transfused on 7/24. 2 units transfused on 7/30 -Hemoglobin has responded appropriately to transfusion and has been stable.  CAD -recent DES to RCA 04/12/15/Elevated troponin -Cardiology is following closely-->Plavix x 3 months at minimum -Suspect elevated troponin more reflective of demand ischemia from symptomatic anemia -Because of soft blood pressures we have been holding most of her cardiac medications including nitrates, Lasix and Diovan. Resumed Lasix at a  lower dose. Resumed Nitrates as well. Diovan will be held for now. -continue low-dose beta blocker with  hold parameters  Chronic anticoagulation/S/P Mechanical AVR -Restarted warfarin 04/16/15. Started on IV heparin for bridging purposes by cardiology. INR is almost therapeutic. She may resume her outpatient warfarin dosing. PT INR early next week.  Diabetes mellitus type 2 She may resume her outpatient medication regimen. Hemoglobin A1c 6.2  Chronic atrial fibrillation Heart rate was well controlled. Continue beta blocker and digoxin. CHADVASc = 6. On warfarin as discussed above.  Acute renal failure superimposed on stage 3 chronic kidney disease -Initially patient did have elevated BUN and creatinine. Thought to be secondary to GI bleed. Improved. We have been holding her Lasix. This was resumed at a lower dose. This caused a bump in her creatinine. Dose was reduced further. Creatinine is stable today. Her current dose of Lasix is significantly lower than what she was at home. She is very euvolemic on examination. This will need to be addressed closely at follow-up.   Essential HTN Blood pressures are stable.   Hyperlipidemia Resume Lipitor.  Patient is stable. No further episodes of bleeding. Wants to go home. Okay for discharge today.    PERTINENT LABS:  The results of significant diagnostics from this hospitalization (including imaging, microbiology, ancillary and laboratory) are listed below for reference.    Microbiology: Recent Results (from the past 240 hour(s))  MRSA PCR Screening     Status: Abnormal   Collection Time: 04/15/15  4:19 PM  Result Value Ref Range Status   MRSA by PCR POSITIVE (A) NEGATIVE Final    Comment:        The GeneXpert MRSA Assay (FDA approved for NASAL specimens only), is one component of a comprehensive MRSA colonization surveillance program. It is not intended to diagnose MRSA infection nor to guide or monitor treatment for MRSA infections. RESULT CALLED TO, READ BACK BY AND VERIFIED WITH: HOOD,T RN 04/15/15 1750 WOOTEN,K       Labs: Basic Metabolic Panel:  Recent Labs Lab 04/20/15 0610 04/22/15 0529 04/23/15 0538 04/24/15 0611 04/25/15 0535  NA 138 138 137 135 138  K 4.2 4.1 4.3 3.8 3.8  CL 108 110 110 103 106  CO2 23 22 20* 22 22  GLUCOSE 129* 122* 137* 117* 124*  BUN 13 13 14  25* 27*  CREATININE 1.17* 1.26* 1.23* 1.52* 1.42*  CALCIUM 8.8* 8.8* 9.1 9.2 9.3   CBC:  Recent Labs Lab 04/21/15 0504 04/22/15 0529 04/23/15 0538 04/24/15 0611 04/25/15 0535  WBC 4.5 4.9 4.4 5.8 5.2  HGB 7.9* 10.5* 11.0* 11.6* 11.7*  HCT 23.8* 30.9* 32.4* 33.8* 34.8*  MCV 90.5 90.4 89.5 89.9 90.9  PLT 190 188 194 211 206   CBG:  Recent Labs Lab 04/24/15 0833 04/24/15 1132 04/24/15 1656 04/24/15 2240 04/25/15 0757  GLUCAP 124* 118* 134* 139* 128*     IMAGING STUDIES Dg Chest Portable 1 View  04/15/2015   CLINICAL DATA:  80 year old presenting with melena and bright red blood per rectum. Coronary stent placed 1 week ago, and patient placed on anticoagulation with Coumadin and Plavix. Patient complains of generalized weakness and chest pressure worse with exertion. Prior CABG and aortic valve replacement. Prior episodes of GI bleeding related to small bowel AVMs.  EXAM: PORTABLE CHEST - 1 VIEW  COMPARISON:  08/09/2010 and earlier.  FINDINGS: Sternotomy for CABG and aortic valve replacement. Cardiac silhouette upper normal in size for AP portable technique, unchanged. Lungs clear. Bronchovascular markings normal. Pulmonary  vascularity normal. No visible pleural effusions. No pneumothorax.  IMPRESSION: Stable borderline heart size.  No acute cardiopulmonary disease.   Electronically Signed   By: Evangeline Dakin M.D.   On: 04/15/2015 12:11    DISCHARGE EXAMINATION: Filed Vitals:   04/24/15 2121 04/25/15 0541 04/25/15 0816 04/25/15 0830  BP: 132/56 127/48 118/76   Pulse: 82 70  68  Temp: 98.4 F (36.9 C) 97.7 F (36.5 C)    TempSrc: Oral Oral    Resp: 18 18    Height:      Weight:  60.4 kg (133 lb 2.5  oz)    SpO2: 99% 99%     General appearance: alert, cooperative, appears stated age and no distress Cardio: regular rate and rhythm, S1, S2 normal, no murmur, click, rub or gallop GI: soft, non-tender; bowel sounds normal; no masses,  no organomegaly Extremities: extremities normal, atraumatic, no cyanosis or edema Neurologic: No focal deficits.  DISPOSITION: Home  Discharge Instructions    Call MD for:  difficulty breathing, headache or visual disturbances    Complete by:  As directed      Call MD for:  extreme fatigue    Complete by:  As directed      Call MD for:  persistant dizziness or light-headedness    Complete by:  As directed      Call MD for:  severe uncontrolled pain    Complete by:  As directed      Diet Carb Modified    Complete by:  As directed      Discharge instructions    Complete by:  As directed   I have asked the [facility] office to schedule blood work for next week. If you dont hear from them by Friday, please call them. You will need your INR checked and your kidney function. Please seek attention if bleeding recurs. Please note changes to the dose of Lasix and Metoprolol. Your Valsartan has been stopped for now and Dr. Irish Lack will consider resuming at follow up.  You were cared for by a hospitalist during your hospital stay. If you have any questions about your discharge medications or the care you received while you were in the hospital after you are discharged, you can call the unit and asked to speak with the hospitalist on call if the hospitalist that took care of you is not available. Once you are discharged, your primary care physician will handle any further medical issues. Please note that NO REFILLS for any discharge medications will be authorized once you are discharged, as it is imperative that you return to your primary care physician (or establish a relationship with a primary care physician if you do not have one) for your aftercare needs so that they  can reassess your need for medications and monitor your lab values. If you do not have a primary care physician, you can call [phone] for a physician referral.     Increase activity slowly    Complete by:  As directed            ALLERGIES:  Allergies  Allergen Reactions  . Darvon [Propoxyphene] Nausea And Vomiting  . Lisinopril Cough  . Septra [Sulfamethoxazole-Trimethoprim]     Increased INR  . Warfarin And Related Other (See Comments)    Generic Warfarin -- pt states it makes her "crazy"  ONLY TOLERATES BRAND  . Penicillins Rash     Discharge Medication List as of 04/25/2015 10:53 AM    START taking  these medications   Details  hydrocortisone (ANUSOL-HC) 25 MG suppository Place 1 suppository (25 mg total) rectally 2 (two) times daily as needed for hemorrhoids or itching., Starting 04/25/2015, Until Discontinued, Print      CONTINUE these medications which have CHANGED   Details  furosemide (LASIX) 40 MG tablet Take 1 tablet (40 mg total) by mouth daily., Starting 04/25/2015, Until Discontinued, Print    metoprolol (LOPRESSOR) 25 MG tablet Take 0.5 tablets (12.5 mg total) by mouth 2 (two) times daily., Starting 04/25/2015, Until Discontinued, Print      CONTINUE these medications which have NOT CHANGED   Details  atorvastatin (LIPITOR) 80 MG tablet TAKE ONE TABLET BY MOUTH ONCE DAILY, Normal    Canagliflozin (INVOKANA) 100 MG TABS Take 100 mg by mouth daily. , Until Discontinued, Historical Med    clopidogrel (PLAVIX) 75 MG tablet Take 1 tablet (75 mg total) by mouth daily with breakfast., Starting 04/13/2015, Until Discontinued, Normal    digoxin (LANOXIN) 0.125 MG tablet Take 0.125 mg by mouth daily., Until Discontinued, Historical Med    FeFum-FePo-FA-B Cmp-C-Zn-Mn-Cu (TANDEM PLUS PO) Take 1 tablet by mouth 2 (two) times daily., Until Discontinued, Historical Med    folic acid (FOLVITE) A999333 MCG tablet Take 400 mcg by mouth every evening. , Until Discontinued, Historical  Med    glimepiride (AMARYL) 4 MG tablet Take 4 mg by mouth daily before breakfast., Starting 03/24/2015, Until Discontinued, Historical Med    isosorbide mononitrate (IMDUR) 60 MG 24 hr tablet TAKE ONE TABLET BY MOUTH ONCE DAILY, Normal    JANUVIA 50 MG tablet Take 50 mg by mouth daily. , Starting 11/26/2013, Until Discontinued, Historical Med    Multiple Vitamin (MULTIVITAMIN WITH MINERALS) TABS Take 1 tablet by mouth daily., Until Discontinued, Historical Med    nitroGLYCERIN (NITROSTAT) 0.4 MG SL tablet Place 1 tablet (0.4 mg total) under the tongue every 5 (five) minutes as needed for chest pain., Starting 04/06/2015, Until Discontinued, Normal    Omega-3 Fatty Acids 1200 MG CAPS Take 3 capsules by mouth 2 (two) times daily., Until Discontinued, Historical Med    pantoprazole (PROTONIX) 40 MG tablet Take 1 tablet (40 mg total) by mouth daily at 6 (six) AM., Starting 04/13/2015, Until Discontinued, Normal    potassium chloride (K-DUR,KLOR-CON) 10 MEQ tablet Take 20 mEq by mouth 2 (two) times daily. , Until Discontinued, Historical Med    warfarin (COUMADIN) 5 MG tablet Take 2.5-5 mg by mouth daily. Take 5 mg every day except on Saturday take 2.5 mg, Until Discontinued, Historical Med    ZETIA 10 MG tablet TAKE ONE TABLET BY MOUTH ONCE DAILY, Normal      STOP taking these medications     valsartan (DIOVAN) 80 MG tablet        Follow-up Information    Follow up with Jettie Booze., MD.   Specialties:  Cardiology, Radiology, Interventional Cardiology   Why:  his office should call you for blood work appointment next week and for follow up with Dr. Orvan Seen information:   [id number] [place] [address] [phone]       Call Simona Huh, MD.   Specialty:  Family Medicine   Why:  As needed   Contact information:   [place] [place] [address] [phone]       TOTAL DISCHARGE TIME: 35 mins  [facility] Pager [phone]  04/25/2015, 3:12 PM

## 2015-04-25 NOTE — Progress Notes (Signed)
Nsg Discharge Note  Admit Date:  04/15/2015 Discharge date: 04/25/2015   [name] to be D/C'd Home per MD order.  AVS completed.  Copy for chart, and copy for patient signed, and dated. Patient/caregiver able to verbalize understanding.  Discharge Medication:   Medication List    STOP taking these medications        valsartan 80 MG tablet  Commonly known as:  DIOVAN      TAKE these medications        atorvastatin 80 MG tablet  Commonly known as:  LIPITOR  TAKE ONE TABLET BY MOUTH ONCE DAILY     clopidogrel 75 MG tablet  Commonly known as:  PLAVIX  Take 1 tablet (75 mg total) by mouth daily with breakfast.     digoxin 0.125 MG tablet  Commonly known as:  LANOXIN  Take 0.125 mg by mouth daily.     folic acid A999333 MCG tablet  Commonly known as:  FOLVITE  Take 400 mcg by mouth every evening.     furosemide 40 MG tablet  Commonly known as:  LASIX  Take 1 tablet (40 mg total) by mouth daily.     glimepiride 4 MG tablet  Commonly known as:  AMARYL  Take 4 mg by mouth daily before breakfast.     hydrocortisone 25 MG suppository  Commonly known as:  ANUSOL-HC  Place 1 suppository (25 mg total) rectally 2 (two) times daily as needed for hemorrhoids or itching.     INVOKANA 100 MG Tabs tablet  Generic drug:  canagliflozin  Take 100 mg by mouth daily.     isosorbide mononitrate 60 MG 24 hr tablet  Commonly known as:  IMDUR  TAKE ONE TABLET BY MOUTH ONCE DAILY     JANUVIA 50 MG tablet  Generic drug:  sitaGLIPtin  Take 50 mg by mouth daily.     metoprolol tartrate 25 MG tablet  Commonly known as:  LOPRESSOR  Take 0.5 tablets (12.5 mg total) by mouth 2 (two) times daily.     multivitamin with minerals Tabs tablet  Take 1 tablet by mouth daily.     nitroGLYCERIN 0.4 MG SL tablet  Commonly known as:  NITROSTAT  Place 1 tablet (0.4 mg total) under the tongue every 5 (five) minutes as needed for chest pain.     Omega-3 Fatty Acids 1200 MG Caps  Take 3 capsules  by mouth 2 (two) times daily.     pantoprazole 40 MG tablet  Commonly known as:  PROTONIX  Take 1 tablet (40 mg total) by mouth daily at 6 (six) AM.     potassium chloride 10 MEQ tablet  Commonly known as:  K-DUR,KLOR-CON  Take 20 mEq by mouth 2 (two) times daily.     TANDEM PLUS PO  Take 1 tablet by mouth 2 (two) times daily.     warfarin 5 MG tablet  Commonly known as:  COUMADIN  Take 2.5-5 mg by mouth daily. Take 5 mg every day except on Saturday take 2.5 mg     ZETIA 10 MG tablet  Generic drug:  ezetimibe  TAKE ONE TABLET BY MOUTH ONCE DAILY        Discharge Assessment: Filed Vitals:   04/25/15 0830  BP:   Pulse: 68  Temp:   Resp:    Skin clean, dry and intact without evidence of skin break down, no evidence of skin tears noted. IV catheter discontinued intact. Site without signs and symptoms of complications -  no redness or edema noted at insertion site, patient denies c/o pain - only slight tenderness at site.  Dressing with slight pressure applied.  D/c Instructions-Education: Discharge instructions given to patient/family with verbalized understanding. D/c education completed with patient/family including follow up instructions, medication list, d/c activities limitations if indicated, with other d/c instructions as indicated by MD - patient able to verbalize understanding, all questions fully answered. Patient instructed to return to ED, call 911, or call MD for any changes in condition.  Patient escorted via [place], and D/C home via private auto.  Tyjai Charbonnet Margaretha Sheffield, RN 04/25/2015 1:28 PM

## 2015-05-01 ENCOUNTER — Other Ambulatory Visit (INDEPENDENT_AMBULATORY_CARE_PROVIDER_SITE_OTHER): Payer: Medicare Other | Admitting: *Deleted

## 2015-05-01 ENCOUNTER — Ambulatory Visit (INDEPENDENT_AMBULATORY_CARE_PROVIDER_SITE_OTHER): Payer: Medicare Other | Admitting: *Deleted

## 2015-05-01 DIAGNOSIS — Z5181 Encounter for therapeutic drug level monitoring: Secondary | ICD-10-CM

## 2015-05-01 DIAGNOSIS — Z954 Presence of other heart-valve replacement: Secondary | ICD-10-CM | POA: Diagnosis not present

## 2015-05-01 DIAGNOSIS — I359 Nonrheumatic aortic valve disorder, unspecified: Secondary | ICD-10-CM | POA: Diagnosis not present

## 2015-05-01 DIAGNOSIS — I25810 Atherosclerosis of coronary artery bypass graft(s) without angina pectoris: Secondary | ICD-10-CM

## 2015-05-01 DIAGNOSIS — Z952 Presence of prosthetic heart valve: Secondary | ICD-10-CM

## 2015-05-01 LAB — HEPATIC FUNCTION PANEL
ALBUMIN: 4.7 g/dL (ref 3.5–5.2)
ALT: 23 U/L (ref 0–35)
AST: 22 U/L (ref 0–37)
Alkaline Phosphatase: 62 U/L (ref 39–117)
BILIRUBIN TOTAL: 0.8 mg/dL (ref 0.2–1.2)
Bilirubin, Direct: 0.1 mg/dL (ref 0.0–0.3)
Total Protein: 7.9 g/dL (ref 6.0–8.3)

## 2015-05-01 LAB — POCT INR: INR: 1.3

## 2015-05-02 NOTE — Progress Notes (Signed)
Cardiology Office Note   Date:  05/03/2015   ID:  [name], DOB 09-20-1933, MRN [id number]  PCP:  Simona Huh, MD  Cardiologist:  Dr. Casandra Doffing   Electrophysiologist:  n/a  Chief Complaint  Patient presents with  . Coronary Artery Disease    s/p PCI to RCA 04/12/15  . Hospitalization Follow-up    s/p LGI Bleed      History of Present Illness: [name] is a 81 y.o. female with a hx of CAD and aortic valve disease status post CABG plus mechanical AVR, subsequent PCI to the RCA with BMS, HTN, HL, diabetes, prior GI bleed, CKD, diastolic CHF, PAF.   She was evaluated by Dr. Irish Lack last month for anginal symptoms. Cardiac catheterization was recommended. LHC demonstrated severe native three-vessel disease. LIMA-LAD was patent, SVG-diagonal patent, SVG-OM/PDA occluded. The second half of the OM/PDA graft was open. RCA stents were patent. She had an ostial RCA lesion of 75% which was treated with a Synergy DES. Because she needs Coumadin for her mechanical AVR, she was continued on Coumadin plus Plavix only (no aspirin).  She was admitted again 7/24-8/3 with acute lower GI bleed. She was transfused with 1 unit PRBCs. Coumadin Plavix were held briefly. These were eventually resumed. She did require an additional 2 units of PRBCs. She was followed by cardiology and gastroenterology. Flexible sigmoidoscopy 04/21/15 demonstrated small internal and external hemorrhoids but no identifiable source for bleeding (of note, colonoscopy was negative in 01/2013 and EGD demonstrated gastritis 01/2013).  Patient did have soft blood pressures. Nitrates, Lasix and Diovan were held. Nitrates and Lasix were resumed at discharge at a lower dose. Of note, Dr. Irish Lack did see her in the hospital. He commented that clopidogrel should be continued for at least 3 months without interruption. This could be stopped after 3 months if needed due to recurrent bleeding.  Here for FU.  Here with her husband.   Doing well since DC.  No further bleeding. Denies any chest pain.  Notes DOE. This is much improved since PCI.  No syncope. No orthopnea, PND.  No significant LE edema.     Studies/Reports Reviewed Today:  Echo 04/10/15 Mild LVH, EF 60-65%, AVR ok (peak and mean 57 and 28 mmHg), mild MR, MAC, mild MR, mild LAE  LHC 04/12/15  LM:  Ostial 99% LAD:  Occluded LCx:  Occluded RCA:  Ostial 75%, mid stent patent, RPAVB stent ok L-LAD ok S-D2 ok S-OM/RPLB occluded  PCI:  STENT SYNERGY DES 3.5X16 to [facility]  Severe native three-vessel coronary artery disease. Patent LIMA to LAD. Patent SVG to diagonal. Patent native RCA which feeds collaterals to the circumflex system.  SVG to OM/PDA jump graft occluded proximally. The second half of this graft is open. Therefore, flow is going from the native RCA, and then retrograde into the vein graft which feeds the obtuse marginal.  Patent stents in the RCA and posterior lateral artery.  Culprit lesion for her symptoms was the new lesion in the ostium of the RCA. This was successfully treated with a 3.5 x 16 Synergy drug-eluting stent, postdilated to greater than 4 mm in diameter.  Ost RCA lesion, 75% stenosed. There is a 0% residual stenosis post intervention. The lesion was not previously treated. A drug-eluting stent was placed.   Carotid US 11/30/14 R 60-79%; L 40-59% Normal subclavian arteries FU 6 mos  Myoview 03/2011 Inf-lat ischemia, EF 82%   Past Medical History  Diagnosis Date  . Hypertension   .  Hyperlipemia   . CHF (congestive heart failure)   . Arteriovenous malformation of gastrointestinal tract   . Heart murmur   . Anginal pain   . Type II diabetes mellitus   . Anemia   . History of blood transfusion     "a few times over the years; usually related to my Coumadin" (04/12/2015  . Chronic lower GI bleeding     "today; last time was ~ 8 yr ago; used to have them often before that too" (02/11/2013)  . Macula lutea degeneration   .  Coronary artery disease   . Old MI (myocardial infarction)     "a coulple /dr in 02/2008; I never even knew I'd had them" (04/12/2015)  . Arthritis     "fingers mostly" (04/12/2015)  . DJD (degenerative joint disease)   . History of gout   . Chronic kidney disease (CKD), stage III (moderate)     Past Surgical History  Procedure Laterality Date  . Appendectomy  1953  . Aortic valve replacement  1993  . Coronary artery bypass graft  1990    LIMA-LAD, SVG-OM-PDA, SVG-D1  . Cardiac valve replacement  1994    St. Jude/notes 10/29/2003 (02/11/2013)  . Exploratory laparotomy  02/24/2008    which revealed a retroperitoneal hematoma and bleeding from the right external iliac artery/notes 03/02/2008 (02/11/2013)   . Dilation and curettage of uterus  1960's    'after a miscarriage" (02/11/2013)  . Enteroscopy N/A 02/13/2013    Procedure: ENTEROSCOPY;  Surgeon: Lafayette Dragon, MD;  Location: [facility] ENDOSCOPY;  Service: Endoscopy;  Laterality: N/A;  . Colonoscopy N/A 02/13/2013    Procedure: COLONOSCOPY;  Surgeon: Lafayette Dragon, MD;  Location: [facility] ENDOSCOPY;  Service: Endoscopy;  Laterality: N/A;  . Cardiac catheterization  ~1990  . Coronary angioplasty with stent placement  2009+    "3 at least; put in 1 stent at a time" (02/11/2013)  . Cardiac catheterization  04/12/2015    Procedure: Coronary Stent Intervention;  Surgeon: Jettie Booze, MD;    SYNERGY DES 3.5X16 to the ostial RCA   . Cardiac catheterization  04/12/2015    Procedure: Coronary/Graft Angiography;  Surgeon: Eloy End, MD; LAD & CFX 100%, patent LIMA-LAD, SVG-D1; SVG-OM-PDA first limb 100%, 2nd limb patent; oRCA 75%>0 w/ stent  . Flexible sigmoidoscopy N/A 04/21/2015    Procedure: FLEXIBLE SIGMOIDOSCOPY;  Surgeon: Ladene Artist, MD;  Location: [facility] ENDOSCOPY;  Service: Endoscopy;  Laterality: N/A;     Current Outpatient Prescriptions  Medication Sig Dispense Refill  . atorvastatin (LIPITOR) 80 MG tablet TAKE ONE TABLET BY MOUTH ONCE  DAILY 30 tablet 3  . Canagliflozin (INVOKANA) 100 MG TABS Take 100 mg by mouth daily.     . clopidogrel (PLAVIX) 75 MG tablet Take 1 tablet (75 mg total) by mouth daily with breakfast. 30 tablet 11  . digoxin (LANOXIN) 0.125 MG tablet Take 0.125 mg by mouth daily.    Marland Kitchen FeFum-FePo-FA-B Cmp-C-Zn-Mn-Cu (TANDEM PLUS PO) Take 1 tablet by mouth 2 (two) times daily.    . folic acid (FOLVITE) A999333 MCG tablet Take 400 mcg by mouth every evening.     . furosemide (LASIX) 40 MG tablet Take 1 tablet (40 mg total) by mouth daily. 30 tablet 11  . glimepiride (AMARYL) 4 MG tablet Take 4 mg by mouth daily before breakfast.    . isosorbide mononitrate (IMDUR) 60 MG 24 hr tablet TAKE ONE TABLET BY MOUTH ONCE DAILY 30 tablet 6  . JANUVIA 50  MG tablet Take 50 mg by mouth daily.     . metoprolol tartrate (LOPRESSOR) 25 MG tablet Take 0.5 tablets (12.5 mg total) by mouth 2 (two) times daily. 60 tablet 11  . Multiple Vitamin (MULTIVITAMIN WITH MINERALS) TABS Take 1 tablet by mouth daily.    . nitroGLYCERIN (NITROSTAT) 0.4 MG SL tablet Place 1 tablet (0.4 mg total) under the tongue every 5 (five) minutes as needed for chest pain. 25 tablet 1  . Omega-3 Fatty Acids 1200 MG CAPS Take 3 capsules by mouth 2 (two) times daily.    . pantoprazole (PROTONIX) 40 MG tablet Take 1 tablet (40 mg total) by mouth daily at 6 (six) AM. 30 tablet 11  . potassium chloride (K-DUR,KLOR-CON) 10 MEQ tablet Take 20 mEq by mouth 2 (two) times daily.     Marland Kitchen sulfamethoxazole-trimethoprim (BACTRIM,SEPTRA) 200-40 MG/5ML suspension Take by mouth 2 (two) times daily.    Marland Kitchen warfarin (COUMADIN) 5 MG tablet Take 2.5-5 mg by mouth daily. Take 5 mg every day except on Saturday take 2.5 mg    . ZETIA 10 MG tablet TAKE ONE TABLET BY MOUTH ONCE DAILY 90 tablet 0  . valsartan (DIOVAN) 80 MG tablet Take 1 tablet (80 mg total) by mouth daily.     No current facility-administered medications for this visit.    Allergies:   Darvon; Lisinopril; Septra; Warfarin  and related; and Penicillins    Social History:  The patient  reports that she has never smoked. She has never used smokeless tobacco. She reports that she does not drink alcohol or use illicit drugs.   Family History:  The patient's family history includes Heart disease in her father and mother.    ROS:   Please see the history of present illness.   Review of Systems  Gastrointestinal: Positive for hematochezia.  All other systems reviewed and are negative.     PHYSICAL EXAM: VS:  BP 152/56 mmHg  Pulse 66  Ht 5' (1.524 m)  Wt 134 lb 12.8 oz (61.145 kg)  BMI 26.33 kg/m2  SpO2 98%    Wt Readings from Last 3 Encounters:  05/03/15 134 lb 12.8 oz (61.145 kg)  04/25/15 133 lb 2.5 oz (60.4 kg)  04/13/15 144 lb 10 oz (65.6 kg)     GEN: Well nourished, well developed, in no acute distress HEENT: normal Neck: no JVD,   no masses Cardiac:  Normal S1/S2, RRR; no murmur ,  no rubs or gallops, no edema ; L wrist without hematoma Respiratory:  clear to auscultation bilaterally, no wheezing, rhonchi or rales. GI: soft, nontender, nondistended, + BS MS: no deformity or atrophy Skin: warm and dry  Neuro:  CNs II-XII intact, Strength and sensation are intact Psych: Normal affect   EKG:  EKG is ordered today.  It demonstrates:   NSR, HR 66, LAD, Q waves V1-2, IVCD, no change from prior tracing.    Recent Labs: 04/05/2015: Pro B Natriuretic peptide (BNP) 171.0* 04/25/2015: BUN 27*; Creatinine, Ser 1.42*; Hemoglobin 11.7*; Platelets 206; Potassium 3.8; Sodium 138 05/01/2015: ALT 23    Lipid Panel    Component Value Date/Time   CHOL 172 05/01/2015 1234   TRIG 248* 05/01/2015 1234   HDL 46 05/01/2015 1234   [place] 76 05/01/2015 1234      ASSESSMENT AND PLAN:  Coronary artery disease involving native coronary artery of native heart without angina pectoris:  Doing well after recent PCI to the RCA. This was complicated by acute lower GI bleed.  She is back on Plavix and Coumadin.  Aspirin has been held to avoid bleeding. She will remain on Plavix for a minimum of 3 months. Continue Plavix, statin, beta blocker, nitrates. She would like to resume her exercise at the gym that she attends. I have asked her to start back slowly.  Essential hypertension: Blood pressure somewhat elevated. Her blood pressure medications were adjusted during her hospitalization for her GI bleed. I will resume her valsartan 80 mg daily. Check a follow-up BMET in 1 week.   History of lower GI bleeding: No recurrence. This was likely related to small bowel AVMs. Check follow-up CBC 1 week.  CKD (chronic kidney disease), unspecified stage: Creatinine stable at discharge. Repeat BMET next week after resuming ARB.   S/P AVR (aortic valve replacement): Continue Coumadin. Continue SP prophylaxis.  Carotid stenosis, bilateral: She will be due for follow-up carotid Dopplers 05/2015.  Hyperlipidemia: Continue statin.  PAF (paroxysmal atrial fibrillation): Maintaining NSR. Continue Coumadin.  Chronic diastolic CHF (congestive heart failure): Continue current dose of Lasix. Volume appears stable. Arrange follow-up BMET.    Medication Changes: Current medicines are reviewed at length with the patient today.  Concerns regarding medicines are as outlined above.  The following changes have been made:   Discontinued Medications   No medications on file   Modified Medications   Modified Medication Previous Medication   FUROSEMIDE (LASIX) 40 MG TABLET furosemide (LASIX) 40 MG tablet      Take 1 tablet (40 mg total) by mouth daily.    Take 1 tablet (40 mg total) by mouth daily.   METOPROLOL TARTRATE (LOPRESSOR) 25 MG TABLET metoprolol (LOPRESSOR) 25 MG tablet      Take 0.5 tablets (12.5 mg total) by mouth 2 (two) times daily.    Take 0.5 tablets (12.5 mg total) by mouth 2 (two) times daily.   New Prescriptions   VALSARTAN (DIOVAN) 80 MG TABLET    Take 1 tablet (80 mg total) by mouth daily.    Labs/  tests ordered today include:   Orders Placed This Encounter  Procedures  . Basic Metabolic Panel (BMET)  . CBC w/Diff  . EKG 12-Lead     Disposition:   FU with Dr. Casandra Doffing 8 weeks.    Signed, Versie Starks, MHS 05/03/2015 12:44 PM    [facility] [place], [place], [place]  [address] Phone: [phone]; Fax: [phone]

## 2015-05-03 ENCOUNTER — Encounter: Payer: Self-pay | Admitting: Physician Assistant

## 2015-05-03 ENCOUNTER — Ambulatory Visit (INDEPENDENT_AMBULATORY_CARE_PROVIDER_SITE_OTHER): Payer: Medicare Other | Admitting: Physician Assistant

## 2015-05-03 VITALS — BP 152/56 | HR 66 | Ht 60.0 in | Wt 134.8 lb

## 2015-05-03 DIAGNOSIS — I10 Essential (primary) hypertension: Secondary | ICD-10-CM

## 2015-05-03 DIAGNOSIS — I480 Paroxysmal atrial fibrillation: Secondary | ICD-10-CM

## 2015-05-03 DIAGNOSIS — Z954 Presence of other heart-valve replacement: Secondary | ICD-10-CM

## 2015-05-03 DIAGNOSIS — N189 Chronic kidney disease, unspecified: Secondary | ICD-10-CM | POA: Diagnosis not present

## 2015-05-03 DIAGNOSIS — I6523 Occlusion and stenosis of bilateral carotid arteries: Secondary | ICD-10-CM

## 2015-05-03 DIAGNOSIS — Z8719 Personal history of other diseases of the digestive system: Secondary | ICD-10-CM

## 2015-05-03 DIAGNOSIS — E785 Hyperlipidemia, unspecified: Secondary | ICD-10-CM

## 2015-05-03 DIAGNOSIS — I2510 Atherosclerotic heart disease of native coronary artery without angina pectoris: Secondary | ICD-10-CM

## 2015-05-03 DIAGNOSIS — I5032 Chronic diastolic (congestive) heart failure: Secondary | ICD-10-CM

## 2015-05-03 DIAGNOSIS — Z952 Presence of prosthetic heart valve: Secondary | ICD-10-CM

## 2015-05-03 LAB — NMR LIPOPROFILE WITH LIPIDS
CHOLESTEROL, TOTAL: 172 mg/dL (ref 100–199)
HDL Particle Number: 25.1 umol/L — ABNORMAL LOW (ref >=30.5)
HDL Size: 8.7 nm — ABNORMAL LOW (ref >=9.2)
HDL-C: 46 mg/dL (ref >39)
LDL (calc): 76 mg/dL (ref 0–99)
LDL PARTICLE NUMBER: 1381 nmol/L — AB (ref <1000)
LDL Size: 20.7 nm (ref >=20.8)
LP-IR SCORE: 82 — AB (ref <=45)
Large HDL-P: 3.4 umol/L — ABNORMAL LOW (ref >=4.8)
Large VLDL-P: 9.6 nmol/L — ABNORMAL HIGH (ref <=2.7)
Small LDL Particle Number: 571 nmol/L — ABNORMAL HIGH (ref <=527)
TRIGLYCERIDES: 248 mg/dL — AB (ref 0–149)
VLDL Size: 57.5 nm — ABNORMAL HIGH (ref <=46.6)

## 2015-05-03 MED ORDER — VALSARTAN 80 MG PO TABS: 80.0000 mg | ORAL_TABLET | Freq: Every day | ORAL | Status: DC

## 2015-05-03 MED ORDER — FUROSEMIDE 40 MG PO TABS: 40.0000 mg | ORAL_TABLET | Freq: Every day | ORAL | Status: DC

## 2015-05-03 MED ORDER — METOPROLOL TARTRATE 25 MG PO TABS: 12.5000 mg | ORAL_TABLET | Freq: Two times a day (BID) | ORAL | Status: DC

## 2015-05-03 NOTE — Patient Instructions (Signed)
Medication Instructions:  1. RESUME VALSARTAN 80 MG DAILY 2. REFILL FOR LASIX AND LOPRESSOR SENT IN TODAY  Labwork: AT YOUR COUMADIN APPT NEXT WEEK  Testing/Procedures: NONE  Follow-Up: 8 WEEKS WITH DR. VARANASI  KEEP YOUR APPT NEXT WEEK FOR COUMADIN CHECK  Any Other Special Instructions Will Be Listed Below (If Applicable).

## 2015-05-08 ENCOUNTER — Other Ambulatory Visit (INDEPENDENT_AMBULATORY_CARE_PROVIDER_SITE_OTHER): Payer: Medicare Other | Admitting: *Deleted

## 2015-05-08 ENCOUNTER — Ambulatory Visit (INDEPENDENT_AMBULATORY_CARE_PROVIDER_SITE_OTHER): Payer: Medicare Other | Admitting: Pharmacist

## 2015-05-08 DIAGNOSIS — Z952 Presence of prosthetic heart valve: Secondary | ICD-10-CM

## 2015-05-08 DIAGNOSIS — Z5181 Encounter for therapeutic drug level monitoring: Secondary | ICD-10-CM | POA: Diagnosis not present

## 2015-05-08 DIAGNOSIS — I359 Nonrheumatic aortic valve disorder, unspecified: Secondary | ICD-10-CM | POA: Diagnosis not present

## 2015-05-08 DIAGNOSIS — N189 Chronic kidney disease, unspecified: Secondary | ICD-10-CM

## 2015-05-08 DIAGNOSIS — Z954 Presence of other heart-valve replacement: Secondary | ICD-10-CM

## 2015-05-08 DIAGNOSIS — Z8719 Personal history of other diseases of the digestive system: Secondary | ICD-10-CM

## 2015-05-08 DIAGNOSIS — I10 Essential (primary) hypertension: Secondary | ICD-10-CM | POA: Diagnosis not present

## 2015-05-08 LAB — CBC WITH DIFFERENTIAL/PLATELET
BASOS ABS: 0 10*3/uL (ref 0.0–0.1)
Basophils Relative: 0.3 % (ref 0.0–3.0)
EOS ABS: 0.1 10*3/uL (ref 0.0–0.7)
Eosinophils Relative: 1.6 % (ref 0.0–5.0)
HCT: 37.8 % (ref 36.0–46.0)
HEMOGLOBIN: 12.8 g/dL (ref 12.0–15.0)
LYMPHS PCT: 30.1 % (ref 12.0–46.0)
Lymphs Abs: 2 10*3/uL (ref 0.7–4.0)
MCHC: 33.9 g/dL (ref 30.0–36.0)
MCV: 90.6 fl (ref 78.0–100.0)
Monocytes Absolute: 0.8 10*3/uL (ref 0.1–1.0)
Monocytes Relative: 12 % (ref 3.0–12.0)
NEUTROS ABS: 3.8 10*3/uL (ref 1.4–7.7)
Neutrophils Relative %: 56 % (ref 43.0–77.0)
PLATELETS: 306 10*3/uL (ref 150.0–400.0)
RBC: 4.17 Mil/uL (ref 3.87–5.11)
RDW: 13.7 % (ref 11.5–15.5)
WBC: 6.7 10*3/uL (ref 4.0–10.5)

## 2015-05-08 LAB — BASIC METABOLIC PANEL
BUN: 28 mg/dL — AB (ref 6–23)
CALCIUM: 9.7 mg/dL (ref 8.4–10.5)
CO2: 30 mEq/L (ref 19–32)
CREATININE: 1.46 mg/dL — AB (ref 0.40–1.20)
Chloride: 103 mEq/L (ref 96–112)
GFR: 36.67 mL/min — ABNORMAL LOW (ref >60.00)
Glucose, Bld: 116 mg/dL — ABNORMAL HIGH (ref 70–99)
Potassium: 4 mEq/L (ref 3.5–5.1)
Sodium: 139 mEq/L (ref 135–145)

## 2015-05-08 LAB — POCT INR: INR: 1.8

## 2015-05-08 NOTE — Addendum Note (Signed)
Addended by: Eulis Foster on: 05/08/2015 01:15 PM   Modules accepted: Orders

## 2015-05-09 ENCOUNTER — Telehealth: Payer: Self-pay | Admitting: *Deleted

## 2015-05-09 NOTE — Telephone Encounter (Signed)
Pt notified of lab results by phone with verbal understanding.  

## 2015-05-17 ENCOUNTER — Other Ambulatory Visit: Payer: Self-pay

## 2015-05-17 DIAGNOSIS — Z1231 Encounter for screening mammogram for malignant neoplasm of breast: Secondary | ICD-10-CM

## 2015-05-18 DIAGNOSIS — I509 Heart failure, unspecified: Secondary | ICD-10-CM | POA: Diagnosis not present

## 2015-05-18 DIAGNOSIS — K922 Gastrointestinal hemorrhage, unspecified: Secondary | ICD-10-CM | POA: Diagnosis not present

## 2015-05-18 DIAGNOSIS — I2510 Atherosclerotic heart disease of native coronary artery without angina pectoris: Secondary | ICD-10-CM | POA: Diagnosis not present

## 2015-05-22 ENCOUNTER — Ambulatory Visit (INDEPENDENT_AMBULATORY_CARE_PROVIDER_SITE_OTHER): Payer: Medicare Other | Admitting: *Deleted

## 2015-05-22 DIAGNOSIS — Z5181 Encounter for therapeutic drug level monitoring: Secondary | ICD-10-CM

## 2015-05-22 DIAGNOSIS — Z952 Presence of prosthetic heart valve: Secondary | ICD-10-CM

## 2015-05-22 DIAGNOSIS — I359 Nonrheumatic aortic valve disorder, unspecified: Secondary | ICD-10-CM

## 2015-05-22 DIAGNOSIS — Z954 Presence of other heart-valve replacement: Secondary | ICD-10-CM | POA: Diagnosis not present

## 2015-05-28 DIAGNOSIS — Z23 Encounter for immunization: Secondary | ICD-10-CM | POA: Diagnosis not present

## 2015-05-29 ENCOUNTER — Ambulatory Visit
Admission: RE | Admit: 2015-05-29 | Discharge: 2015-05-29 | Disposition: A | Discharge status: Home / Self Care | Payer: Medicare Other | Source: Ambulatory Visit

## 2015-05-29 ENCOUNTER — Other Ambulatory Visit: Payer: Medicare Other

## 2015-05-29 DIAGNOSIS — Z1231 Encounter for screening mammogram for malignant neoplasm of breast: Secondary | ICD-10-CM

## 2015-06-05 ENCOUNTER — Ambulatory Visit (INDEPENDENT_AMBULATORY_CARE_PROVIDER_SITE_OTHER): Payer: Medicare Other

## 2015-06-05 DIAGNOSIS — I359 Nonrheumatic aortic valve disorder, unspecified: Secondary | ICD-10-CM

## 2015-06-05 DIAGNOSIS — Z5181 Encounter for therapeutic drug level monitoring: Secondary | ICD-10-CM | POA: Diagnosis not present

## 2015-06-05 DIAGNOSIS — Z954 Presence of other heart-valve replacement: Secondary | ICD-10-CM | POA: Diagnosis not present

## 2015-06-05 DIAGNOSIS — Z952 Presence of prosthetic heart valve: Secondary | ICD-10-CM

## 2015-06-05 LAB — POCT INR: INR: 2.1

## 2015-06-09 ENCOUNTER — Other Ambulatory Visit: Payer: Self-pay | Admitting: Interventional Cardiology

## 2015-06-13 DIAGNOSIS — H3532 Exudative age-related macular degeneration: Secondary | ICD-10-CM | POA: Diagnosis not present

## 2015-06-13 DIAGNOSIS — H2513 Age-related nuclear cataract, bilateral: Secondary | ICD-10-CM | POA: Diagnosis not present

## 2015-06-13 NOTE — Patient Outreach (Signed)
[facility]) Care Management  06/13/2015  [name] 25-Sep-1932 [id number]   Referral from [employer] Tier 2 List, assigned [name], RN to outreach.  Thanks, [name]. [place], [place] Assistant Phone: [phone] Fax: [phone]

## 2015-06-19 ENCOUNTER — Other Ambulatory Visit: Payer: Self-pay

## 2015-06-19 NOTE — Patient Outreach (Signed)
[facility]) Care Management  06/19/2015  KASSIDY HUEBSCH [birth year] [id number]   Screening call completed to patient.  Explanation of [facility] services provided, but patient declined.  Stated she had no need for [facility] care management at this time.   [name], RN, MSN [place] [phone] Fax [phone]

## 2015-06-21 NOTE — Patient Outreach (Signed)
[facility]) Care Management  06/21/2015  [name] July 15, 1933 [id number]   Notification from [name], RN to close case due to patient refused [facility] Management services.  Thanks, [name]. [place], [place] Assistant Phone: [phone] Fax: [phone]

## 2015-06-26 ENCOUNTER — Ambulatory Visit (INDEPENDENT_AMBULATORY_CARE_PROVIDER_SITE_OTHER): Payer: Medicare Other | Admitting: *Deleted

## 2015-06-26 ENCOUNTER — Other Ambulatory Visit: Payer: Self-pay | Admitting: Interventional Cardiology

## 2015-06-26 DIAGNOSIS — Z5181 Encounter for therapeutic drug level monitoring: Secondary | ICD-10-CM | POA: Diagnosis not present

## 2015-06-26 DIAGNOSIS — Z952 Presence of prosthetic heart valve: Secondary | ICD-10-CM

## 2015-06-26 DIAGNOSIS — Z954 Presence of other heart-valve replacement: Secondary | ICD-10-CM

## 2015-06-26 DIAGNOSIS — I359 Nonrheumatic aortic valve disorder, unspecified: Secondary | ICD-10-CM

## 2015-06-26 LAB — POCT INR: INR: 1.7

## 2015-07-05 ENCOUNTER — Encounter: Payer: Self-pay | Admitting: Interventional Cardiology

## 2015-07-05 ENCOUNTER — Ambulatory Visit (INDEPENDENT_AMBULATORY_CARE_PROVIDER_SITE_OTHER): Payer: Medicare Other | Admitting: Interventional Cardiology

## 2015-07-05 ENCOUNTER — Ambulatory Visit (INDEPENDENT_AMBULATORY_CARE_PROVIDER_SITE_OTHER): Payer: Medicare Other | Admitting: Pharmacist

## 2015-07-05 VITALS — BP 130/56 | HR 70 | Ht 60.0 in | Wt 139.2 lb

## 2015-07-05 DIAGNOSIS — Z7901 Long term (current) use of anticoagulants: Secondary | ICD-10-CM

## 2015-07-05 DIAGNOSIS — Z952 Presence of prosthetic heart valve: Secondary | ICD-10-CM

## 2015-07-05 DIAGNOSIS — I6523 Occlusion and stenosis of bilateral carotid arteries: Secondary | ICD-10-CM

## 2015-07-05 DIAGNOSIS — Z954 Presence of other heart-valve replacement: Secondary | ICD-10-CM

## 2015-07-05 DIAGNOSIS — I10 Essential (primary) hypertension: Secondary | ICD-10-CM | POA: Diagnosis not present

## 2015-07-05 DIAGNOSIS — I2510 Atherosclerotic heart disease of native coronary artery without angina pectoris: Secondary | ICD-10-CM

## 2015-07-05 DIAGNOSIS — I359 Nonrheumatic aortic valve disorder, unspecified: Secondary | ICD-10-CM

## 2015-07-05 DIAGNOSIS — I2583 Coronary atherosclerosis due to lipid rich plaque: Secondary | ICD-10-CM

## 2015-07-05 DIAGNOSIS — Z5181 Encounter for therapeutic drug level monitoring: Secondary | ICD-10-CM

## 2015-07-05 LAB — CBC WITH DIFFERENTIAL/PLATELET
Basophils Absolute: 0 10*3/uL (ref 0.0–0.1)
Basophils Relative: 0 % (ref 0–1)
Eosinophils Absolute: 0.2 10*3/uL (ref 0.0–0.7)
Eosinophils Relative: 3 % (ref 0–5)
HEMATOCRIT: 34.3 % — AB (ref 36.0–46.0)
HEMOGLOBIN: 12.2 g/dL (ref 12.0–15.0)
LYMPHS ABS: 1.8 10*3/uL (ref 0.7–4.0)
Lymphocytes Relative: 27 % (ref 12–46)
MCH: 30.6 pg (ref 26.0–34.0)
MCHC: 35.6 g/dL (ref 30.0–36.0)
MCV: 86 fL (ref 78.0–100.0)
MONO ABS: 0.7 10*3/uL (ref 0.1–1.0)
MONOS PCT: 10 % (ref 3–12)
MPV: 8.9 fL (ref 8.6–12.4)
NEUTROS ABS: 4.1 10*3/uL (ref 1.7–7.7)
Neutrophils Relative %: 60 % (ref 43–77)
Platelets: 239 10*3/uL (ref 150–400)
RBC: 3.99 MIL/uL (ref 3.87–5.11)
RDW: 13.9 % (ref 11.5–15.5)
WBC: 6.8 10*3/uL (ref 4.0–10.5)

## 2015-07-05 LAB — POCT INR: INR: 2.6

## 2015-07-05 LAB — BASIC METABOLIC PANEL
BUN: 26 mg/dL — ABNORMAL HIGH (ref 7–25)
CO2: 26 mmol/L (ref 20–31)
Calcium: 9.7 mg/dL (ref 8.6–10.4)
Chloride: 102 mmol/L (ref 98–110)
Creat: 1.6 mg/dL — ABNORMAL HIGH (ref 0.60–0.93)
Glucose, Bld: 139 mg/dL — ABNORMAL HIGH (ref 65–99)
POTASSIUM: 4.7 mmol/L (ref 3.5–5.3)
Sodium: 136 mmol/L (ref 135–146)

## 2015-07-05 NOTE — Patient Instructions (Addendum)
.  Medication Instructions:  Same-no changes  Labwork: BMET and CBCD  Testing/Procedures: None  Follow-Up: Your physician wants you to follow-up in: about 4 months. You will receive a reminder letter in the mail two months in advance. If you don't receive a letter, please call our office to schedule the follow-up appointment.

## 2015-07-05 NOTE — Progress Notes (Signed)
Patient ID: [name], female   DOB: 1932-12-26, 81 y.o.   MRN: [id number]     Cardiology Office Note   Date:  07/05/2015   ID:  Renee, [name] [birth year], MRN [id number]  PCP:  Simona Huh, MD    Chief Complaint  Patient presents with  . Follow-up    3 MONTH POST CATH     Wt Readings from Last 3 Encounters:  07/05/15 139 lb 3.2 oz (63.141 kg)  05/03/15 134 lb 12.8 oz (61.145 kg)  04/25/15 133 lb 2.5 oz (60.4 kg)       History of Present Illness: [name] is a 81 y.o. female  Who has had AVR and CABG. She is subsequently had PCI to the RCA system with bare-metal stents in 2008 and 2011.  She had anginal symptoms several months ago. We performed cardiac cath and she had an ostial lesion in the RCA. This was treated with a large diameter drug-eluting stent. After the procedure, she had some GI bleeding. She was hospitalized. She required 1 transfusion. Since that time, her GI bleeding is slow down. Her Coumadin has been managed with target INR between 2 and 3.  She has been back to the gym exercising. She denies any discomfort in her chest. She has no shortness of breath. She is tolerating her medicines well.    Past Medical History  Diagnosis Date  . Hypertension   . Hyperlipemia   . CHF (congestive heart failure) ([place])   . Arteriovenous malformation of gastrointestinal tract   . Heart murmur   . Anginal pain ([place])   . Type II diabetes mellitus ([place])   . Anemia   . History of blood transfusion     "a few times over the years; usually related to my Coumadin" (04/12/2015  . Chronic lower GI bleeding     "today; last time was ~ 8 yr ago; used to have them often before that too" (02/11/2013)  . Macula lutea degeneration   . Coronary artery disease   . Old MI (myocardial infarction)     "a coulple /dr in 02/2008; I never even knew I'd had them" (04/12/2015)  . Arthritis     "fingers mostly" (04/12/2015)  . DJD (degenerative joint disease)   . History of  gout   . Chronic kidney disease (CKD), stage III (moderate)     Past Surgical History  Procedure Laterality Date  . Appendectomy  1953  . Aortic valve replacement  1993  . Coronary artery bypass graft  1990    LIMA-LAD, SVG-OM-PDA, SVG-D1  . Cardiac valve replacement  1994    St. Jude/notes 10/29/2003 (02/11/2013)  . Exploratory laparotomy  02/24/2008    which revealed a retroperitoneal hematoma and bleeding from the right external iliac artery/notes 03/02/2008 (02/11/2013)   . Dilation and curettage of uterus  1960's    'after a miscarriage" (02/11/2013)  . Enteroscopy N/A 02/13/2013    Procedure: ENTEROSCOPY;  Surgeon: Lafayette Dragon, MD;  Location[facility] ENDOSCOPY;  Service: Endoscopy;  Laterality: N/A;  . Colonoscopy N/A 02/13/2013    Procedure: COLONOSCOPY;  Surgeon: Lafayette Dragon, MD;  Location: [facility] ENDOSCOPY;  Service: Endoscopy;  Laterality: N/A;  . Cardiac catheterization  ~1990  . Coronary angioplasty with stent placement  2009+    "3 at least; put in 1 stent at a time" (02/11/2013)  . Cardiac catheterization  04/12/2015    Procedure: Coronary Stent Intervention;  Surgeon: Jettie Booze, MD;  SYNERGY DES 3.5X16 to the ostial RCA   . Cardiac catheterization  04/12/2015    Procedure: Coronary/Graft Angiography;  Surgeon: Eloy End, MD; LAD & CFX 100%, patent LIMA-LAD, SVG-D1; SVG-OM-PDA first limb 100%, 2nd limb patent; oRCA 75%>0 w/ stent  . Flexible sigmoidoscopy N/A 04/21/2015    Procedure: FLEXIBLE SIGMOIDOSCOPY;  Surgeon: Ladene Artist, MD;  Location: [facility] ENDOSCOPY;  Service: Endoscopy;  Laterality: N/A;     Current Outpatient Prescriptions  Medication Sig Dispense Refill  . atorvastatin (LIPITOR) 80 MG tablet TAKE ONE TABLET BY MOUTH ONCE DAILY 30 tablet 1  . Canagliflozin (INVOKANA) 100 MG TABS Take 100 mg by mouth daily.     . clopidogrel (PLAVIX) 75 MG tablet Take 1 tablet (75 mg total) by mouth daily with breakfast. 30 tablet 11  . digoxin (LANOXIN) 0.125 MG  tablet Take 0.125 mg by mouth daily.    Marland Kitchen FeFum-FePo-FA-B Cmp-C-Zn-Mn-Cu (SE-TAN PLUS) 162-115.2-1 MG CAPS Take 1 tablet by mouth 2 (two) times daily.  3  . FeFum-FePo-FA-B Cmp-C-Zn-Mn-Cu (TANDEM PLUS PO) Take 1 tablet by mouth 2 (two) times daily.    . folic acid (FOLVITE) A999333 MCG tablet Take 400 mcg by mouth every evening.     . furosemide (LASIX) 40 MG tablet Take 1 tablet (40 mg total) by mouth daily. 30 tablet 11  . glimepiride (AMARYL) 4 MG tablet Take 4 mg by mouth daily before breakfast.    . isosorbide mononitrate (IMDUR) 60 MG 24 hr tablet TAKE ONE TABLET BY MOUTH ONCE DAILY 30 tablet 0  . JANUVIA 50 MG tablet Take 50 mg by mouth daily.     . metoprolol tartrate (LOPRESSOR) 25 MG tablet Take 0.5 tablets (12.5 mg total) by mouth 2 (two) times daily. 60 tablet 11  . Multiple Vitamin (MULTIVITAMIN WITH MINERALS) TABS Take 1 tablet by mouth daily.    . nitroGLYCERIN (NITROSTAT) 0.4 MG SL tablet Place 1 tablet (0.4 mg total) under the tongue every 5 (five) minutes as needed for chest pain. 25 tablet 1  . Omega-3 Fatty Acids 1200 MG CAPS Take 3 capsules by mouth 2 (two) times daily.    . pantoprazole (PROTONIX) 40 MG tablet Take 1 tablet (40 mg total) by mouth daily at 6 (six) AM. 30 tablet 11  . potassium chloride (K-DUR,KLOR-CON) 10 MEQ tablet Take 20 mEq by mouth 2 (two) times daily.     Marland Kitchen sulfamethoxazole-trimethoprim (BACTRIM,SEPTRA) 200-40 MG/5ML suspension Take by mouth 2 (two) times daily.    . valsartan (DIOVAN) 80 MG tablet Take 1 tablet (80 mg total) by mouth daily.    Marland Kitchen warfarin (COUMADIN) 5 MG tablet Take 2.5-5 mg by mouth daily. Take 5 mg every day except on Saturday take 2.5 mg    . ZETIA 10 MG tablet TAKE ONE TABLET BY MOUTH ONCE DAILY 90 tablet 0   No current facility-administered medications for this visit.    Allergies:   Darvon; Lisinopril; Septra; Warfarin and related; and Penicillins    Social History:  The patient  reports that she has never smoked. She has never  used smokeless tobacco. She reports that she does not drink alcohol or use illicit drugs.   Family History:  The patient's family history includes Heart disease in her father and mother.    ROS:  Please see the history of present illness.   Otherwise, review of systems are positive for persistent swelling, under control.   All other systems are reviewed and negative.    PHYSICAL EXAM:  VS:  BP 130/56 mmHg  Pulse 70  Ht 5' (1.524 m)  Wt 139 lb 3.2 oz (63.141 kg)  BMI 27.19 kg/m2  SpO2 96% , BMI Body mass index is 27.19 kg/(m^2). GEN: Well nourished, well developed, in no acute distress HEENT: normal Neck: no JVD, carotid bruits, or masses Cardiac: RRR; 2/6 systolic murmurs, rubs, or gallops,no edema , crisp S2 click Respiratory:  clear to auscultation bilaterally, normal work of breathing GI: soft, nontender, nondistended, + BS MS: no deformity or atrophy Skin: warm and dry, no rash Neuro:  Strength and sensation are intact Psych: euthymic mood, full affect      Recent Labs: 04/05/2015: Pro B Natriuretic peptide (BNP) 171.0* 05/01/2015: ALT 23 05/08/2015: BUN 28*; Creatinine, Ser 1.46*; Hemoglobin 12.8; Platelets 306.0; Potassium 4.0; Sodium 139   Lipid Panel    Component Value Date/Time   CHOL 172 05/01/2015 1234   TRIG 248* 05/01/2015 1234   HDL 46 05/01/2015 1234   LDLCALC 76 05/01/2015 1234     Other studies Reviewed: Additional studies/ records that were reviewed today with results demonstrating: .   ASSESSMENT AND PLAN:  1. Coronary artery disease: No angina. Will try to continue clopidogrel for 6 months minimum. No aspirin given that she is on Coumadin. Continue aggressive secondary prevention. 2. Status post AVR: Check INR today and adjust Coumadin dose as needed. 3. Hyperlipidemia: Triglycerides elevated at most recent check. Continue medical therapy. 4. Hypertension: BP controlled.    Current medicines are reviewed at length with the patient today.  The  patient concerns regarding her medicines were addressed.  The following changes have been made:  No change  Labs/ tests ordered today include: No orders of the defined types were placed in this encounter.    Recommend 150 minutes/week of aerobic exercise Low fat, low carb, high fiber diet recommended  Disposition:   FU in 3-4 months. Will decide potentially on stopping clopidogrel at that point.   Teresita Madura., MD  07/05/2015 11:34 AM    [facility] [place], [place], [place]  [address] Phone: [phone]; Fax: [phone]

## 2015-07-07 ENCOUNTER — Other Ambulatory Visit: Payer: Self-pay | Admitting: Interventional Cardiology

## 2015-07-09 ENCOUNTER — Other Ambulatory Visit: Payer: Self-pay | Admitting: *Deleted

## 2015-07-09 NOTE — Patient Outreach (Signed)
Referred by Dr. Marisue Humble for possible care management services. Pt in on the Next Generation Risk Tier 2 list. I called and left a message to please call me back. The message on the answering machine stated that if I was selling anything to hang up, so I am not sure how my message with be interpreted. If I do not hear back, I will send our [facility] information.  Deloria Lair [facility] [phone]

## 2015-07-10 ENCOUNTER — Other Ambulatory Visit: Payer: Self-pay | Admitting: *Deleted

## 2015-07-10 DIAGNOSIS — E113293 Type 2 diabetes mellitus with mild nonproliferative diabetic retinopathy without macular edema, bilateral: Secondary | ICD-10-CM | POA: Diagnosis not present

## 2015-07-10 DIAGNOSIS — H353221 Exudative age-related macular degeneration, left eye, with active choroidal neovascularization: Secondary | ICD-10-CM | POA: Diagnosis not present

## 2015-07-10 DIAGNOSIS — H43813 Vitreous degeneration, bilateral: Secondary | ICD-10-CM | POA: Diagnosis not present

## 2015-07-10 DIAGNOSIS — H353112 Nonexudative age-related macular degeneration, right eye, intermediate dry stage: Secondary | ICD-10-CM | POA: Diagnosis not present

## 2015-07-10 NOTE — Patient Outreach (Signed)
2nd telephone outreach to high risk pt on Dr. Andrew Au request. Left another message to return my call.  Deloria Lair [facility] [place] [phone]

## 2015-07-11 DIAGNOSIS — H353221 Exudative age-related macular degeneration, left eye, with active choroidal neovascularization: Secondary | ICD-10-CM | POA: Diagnosis not present

## 2015-07-21 ENCOUNTER — Other Ambulatory Visit: Payer: Self-pay | Admitting: Interventional Cardiology

## 2015-07-24 ENCOUNTER — Ambulatory Visit (INDEPENDENT_AMBULATORY_CARE_PROVIDER_SITE_OTHER): Payer: Medicare Other | Admitting: *Deleted

## 2015-07-24 DIAGNOSIS — Z952 Presence of prosthetic heart valve: Secondary | ICD-10-CM

## 2015-07-24 DIAGNOSIS — Z954 Presence of other heart-valve replacement: Secondary | ICD-10-CM

## 2015-07-24 DIAGNOSIS — Z5181 Encounter for therapeutic drug level monitoring: Secondary | ICD-10-CM

## 2015-07-24 DIAGNOSIS — I359 Nonrheumatic aortic valve disorder, unspecified: Secondary | ICD-10-CM | POA: Diagnosis not present

## 2015-07-24 LAB — POCT INR: INR: 2

## 2015-08-04 ENCOUNTER — Other Ambulatory Visit: Payer: Self-pay | Admitting: Interventional Cardiology

## 2015-08-06 DIAGNOSIS — I663 Occlusion and stenosis of cerebellar arteries: Secondary | ICD-10-CM | POA: Diagnosis not present

## 2015-08-06 DIAGNOSIS — I2510 Atherosclerotic heart disease of native coronary artery without angina pectoris: Secondary | ICD-10-CM | POA: Diagnosis not present

## 2015-08-06 DIAGNOSIS — E1129 Type 2 diabetes mellitus with other diabetic kidney complication: Secondary | ICD-10-CM | POA: Diagnosis not present

## 2015-08-06 DIAGNOSIS — E7800 Pure hypercholesterolemia, unspecified: Secondary | ICD-10-CM | POA: Diagnosis not present

## 2015-08-06 DIAGNOSIS — Z952 Presence of prosthetic heart valve: Secondary | ICD-10-CM | POA: Diagnosis not present

## 2015-08-06 DIAGNOSIS — E113293 Type 2 diabetes mellitus with mild nonproliferative diabetic retinopathy without macular edema, bilateral: Secondary | ICD-10-CM | POA: Diagnosis not present

## 2015-08-06 DIAGNOSIS — H3530 Unspecified macular degeneration: Secondary | ICD-10-CM | POA: Diagnosis not present

## 2015-08-06 DIAGNOSIS — I509 Heart failure, unspecified: Secondary | ICD-10-CM | POA: Diagnosis not present

## 2015-08-06 DIAGNOSIS — N183 Chronic kidney disease, stage 3 (moderate): Secondary | ICD-10-CM | POA: Diagnosis not present

## 2015-08-06 DIAGNOSIS — M109 Gout, unspecified: Secondary | ICD-10-CM | POA: Diagnosis not present

## 2015-08-06 DIAGNOSIS — R809 Proteinuria, unspecified: Secondary | ICD-10-CM | POA: Diagnosis not present

## 2015-08-06 DIAGNOSIS — I130 Hypertensive heart and chronic kidney disease with heart failure and stage 1 through stage 4 chronic kidney disease, or unspecified chronic kidney disease: Secondary | ICD-10-CM | POA: Diagnosis not present

## 2015-08-07 ENCOUNTER — Encounter: Payer: Self-pay | Admitting: Interventional Cardiology

## 2015-08-08 DIAGNOSIS — H353221 Exudative age-related macular degeneration, left eye, with active choroidal neovascularization: Secondary | ICD-10-CM | POA: Diagnosis not present

## 2015-08-08 DIAGNOSIS — H353112 Nonexudative age-related macular degeneration, right eye, intermediate dry stage: Secondary | ICD-10-CM | POA: Diagnosis not present

## 2015-08-14 ENCOUNTER — Ambulatory Visit (INDEPENDENT_AMBULATORY_CARE_PROVIDER_SITE_OTHER): Payer: Medicare Other | Admitting: *Deleted

## 2015-08-14 DIAGNOSIS — I359 Nonrheumatic aortic valve disorder, unspecified: Secondary | ICD-10-CM

## 2015-08-14 DIAGNOSIS — Z954 Presence of other heart-valve replacement: Secondary | ICD-10-CM | POA: Diagnosis not present

## 2015-08-14 DIAGNOSIS — Z952 Presence of prosthetic heart valve: Secondary | ICD-10-CM

## 2015-08-14 DIAGNOSIS — Z5181 Encounter for therapeutic drug level monitoring: Secondary | ICD-10-CM

## 2015-08-14 LAB — POCT INR: INR: 1.9

## 2015-09-04 ENCOUNTER — Ambulatory Visit (INDEPENDENT_AMBULATORY_CARE_PROVIDER_SITE_OTHER): Payer: Medicare Other | Admitting: *Deleted

## 2015-09-04 DIAGNOSIS — I359 Nonrheumatic aortic valve disorder, unspecified: Secondary | ICD-10-CM

## 2015-09-04 DIAGNOSIS — Z954 Presence of other heart-valve replacement: Secondary | ICD-10-CM | POA: Diagnosis not present

## 2015-09-04 DIAGNOSIS — Z5181 Encounter for therapeutic drug level monitoring: Secondary | ICD-10-CM

## 2015-09-04 DIAGNOSIS — Z952 Presence of prosthetic heart valve: Secondary | ICD-10-CM

## 2015-09-04 LAB — POCT INR: INR: 2

## 2015-09-08 ENCOUNTER — Other Ambulatory Visit: Payer: Self-pay | Admitting: Interventional Cardiology

## 2015-09-12 DIAGNOSIS — H35371 Puckering of macula, right eye: Secondary | ICD-10-CM | POA: Diagnosis not present

## 2015-09-12 DIAGNOSIS — H353221 Exudative age-related macular degeneration, left eye, with active choroidal neovascularization: Secondary | ICD-10-CM | POA: Diagnosis not present

## 2015-09-12 DIAGNOSIS — H353112 Nonexudative age-related macular degeneration, right eye, intermediate dry stage: Secondary | ICD-10-CM | POA: Diagnosis not present

## 2015-09-12 DIAGNOSIS — E113293 Type 2 diabetes mellitus with mild nonproliferative diabetic retinopathy without macular edema, bilateral: Secondary | ICD-10-CM | POA: Diagnosis not present

## 2015-09-25 ENCOUNTER — Ambulatory Visit (INDEPENDENT_AMBULATORY_CARE_PROVIDER_SITE_OTHER): Payer: Medicare Other | Admitting: Pharmacist

## 2015-09-25 DIAGNOSIS — I359 Nonrheumatic aortic valve disorder, unspecified: Secondary | ICD-10-CM | POA: Diagnosis not present

## 2015-09-25 DIAGNOSIS — Z952 Presence of prosthetic heart valve: Secondary | ICD-10-CM

## 2015-09-25 DIAGNOSIS — Z954 Presence of other heart-valve replacement: Secondary | ICD-10-CM

## 2015-09-25 DIAGNOSIS — Z5181 Encounter for therapeutic drug level monitoring: Secondary | ICD-10-CM | POA: Diagnosis not present

## 2015-09-25 LAB — POCT INR: INR: 2.1

## 2015-10-01 ENCOUNTER — Telehealth: Payer: Self-pay | Admitting: Interventional Cardiology

## 2015-10-01 NOTE — Telephone Encounter (Signed)
Pt asking if she should refill plavix before appt 11/05/15.

## 2015-10-01 NOTE — Telephone Encounter (Signed)
New Message   Pt is on Plavix and she wants to know if she should refill her prescriptions   or not before coming into the office to see Richardson Dopp 11/05/15  Per AVS Dr.Varanasi want pt to be seen in Feb

## 2015-10-01 NOTE — Telephone Encounter (Signed)
Hospital records indicate pt had DES done 04/12/15.  Pt advised Dr Hassell Done plan in October 2016 was to see pt in 3-4 months and make a decision about continuing plavix at that time.  Pt states she has not had any bleeding issues and cost is not a factor in refilling plavix.  Pt states she will continue plavix until her appt 11/05/15 with Margaret Pyle.

## 2015-10-01 NOTE — Telephone Encounter (Signed)
Would continue Plavix for now.  If we can get a year of Plavix, that will be good.[name]

## 2015-10-04 NOTE — Telephone Encounter (Signed)
**Note De-Identified  Obfuscation** The pt is advised and she verbalized understanding. She states that she does not need any refills at this time.

## 2015-10-16 ENCOUNTER — Ambulatory Visit (INDEPENDENT_AMBULATORY_CARE_PROVIDER_SITE_OTHER): Payer: Medicare Other | Admitting: *Deleted

## 2015-10-16 DIAGNOSIS — Z952 Presence of prosthetic heart valve: Secondary | ICD-10-CM

## 2015-10-16 DIAGNOSIS — I359 Nonrheumatic aortic valve disorder, unspecified: Secondary | ICD-10-CM | POA: Diagnosis not present

## 2015-10-16 DIAGNOSIS — Z5181 Encounter for therapeutic drug level monitoring: Secondary | ICD-10-CM

## 2015-10-16 DIAGNOSIS — Z954 Presence of other heart-valve replacement: Secondary | ICD-10-CM | POA: Diagnosis not present

## 2015-10-16 LAB — POCT INR: INR: 1.9

## 2015-10-17 DIAGNOSIS — H353221 Exudative age-related macular degeneration, left eye, with active choroidal neovascularization: Secondary | ICD-10-CM | POA: Diagnosis not present

## 2015-10-17 DIAGNOSIS — H35423 Microcystoid degeneration of retina, bilateral: Secondary | ICD-10-CM | POA: Diagnosis not present

## 2015-10-17 DIAGNOSIS — E113293 Type 2 diabetes mellitus with mild nonproliferative diabetic retinopathy without macular edema, bilateral: Secondary | ICD-10-CM | POA: Diagnosis not present

## 2015-10-17 DIAGNOSIS — H353112 Nonexudative age-related macular degeneration, right eye, intermediate dry stage: Secondary | ICD-10-CM | POA: Diagnosis not present

## 2015-11-04 NOTE — Progress Notes (Signed)
Cardiology Office Note:    Date:  11/05/2015   ID:  [name], DOB 20-Jan-1933, MRN [id number]  PCP:  Simona Huh, MD  Cardiologist:  Dr. Casandra Doffing   Electrophysiologist:  n/a  Chief Complaint  Patient presents with  . Coronary Artery Disease    Follow up     History of Present Illness:     [name] is a 82 y.o. female with a hx of CAD and aortic valve disease status post CABG plus mechanical AVR, subsequent PCI to the RCA with BMS, HTN, HL, diabetes, prior GI bleed, CKD, diastolic CHF, PAF.   She was evaluated by Dr. Irish Lack 7/16 for anginal symptoms.  LHC demonstrated severe native three-vessel disease. LIMA-LAD was patent, SVG-diagonal patent, SVG-OM/PDA occluded. The second half of the OM/PDA graft was open. RCA stents were patent. She had an ostial RCA lesion of 75% which was treated with a Synergy DES. Because she needs Coumadin for her mechanical AVR, she was continued on Coumadin plus Plavix only (no aspirin).  She was then admitted 04/15/15-04/25/15 with acute LGI bleed requiring transfusion with PRBCs. Coumadin Plavix were held briefly but were eventually resumed. Sigmoidoscopy demonstrated small internal and external hemorrhoids but no identifiable source for bleeding (of note, colonoscopy was negative in 01/2013 and EGD demonstrated gastritis 01/2013). Plavix could be DC'd after 3 mos if needed b/c of recurrent bleeding.  Last seen by Dr. Casandra Doffing in 10/16.  Plavix was continued.  Goal is to complete at least 6 mos (12 mos if poss).   Here with her husband.  She is doing well.  She has some occasional DOE.  This is mild and unchanged.  She notes occasional L sided chest pain.  This is a chronic symptom for years without change. She denies any recurrent angina. No bleeding issues.  No melena.  She denies orthopnea, PND, edema.  She denies syncope.      Past Medical History  Diagnosis Date  . Hypertension   . Hyperlipemia   . CHF (congestive heart  failure) ([place])   . Arteriovenous malformation of gastrointestinal tract   . Heart murmur   . Anginal pain ([place])   . Type II diabetes mellitus ([place])   . Anemia   . History of blood transfusion     "a few times over the years; usually related to my Coumadin" (04/12/2015  . Chronic lower GI bleeding     "today; last time was ~ 8 yr ago; used to have them often before that too" (02/11/2013)  . Macula lutea degeneration   . Coronary artery disease   . Old MI (myocardial infarction)     "a coulple /dr in 02/2008; I never even knew I'd had them" (04/12/2015)  . Arthritis     "fingers mostly" (04/12/2015)  . DJD (degenerative joint disease)   . History of gout   . Chronic kidney disease (CKD), stage III (moderate)     Past Surgical History  Procedure Laterality Date  . Appendectomy  1953  . Aortic valve replacement  1993  . Coronary artery bypass graft  1990    LIMA-LAD, SVG-OM-PDA, SVG-D1  . Cardiac valve replacement  1994    St. Jude/notes 10/29/2003 (02/11/2013)  . Exploratory laparotomy  02/24/2008    which revealed a retroperitoneal hematoma and bleeding from the right external iliac artery/notes 03/02/2008 (02/11/2013)   . Dilation and curettage of uterus  1960's    'after a miscarriage" (02/11/2013)  . Enteroscopy N/A 02/13/2013  Procedure: ENTEROSCOPY;  Surgeon: Lafayette Dragon, MD;  Location: [facility] ENDOSCOPY;  Service: Endoscopy;  Laterality: N/A;  . Colonoscopy N/A 02/13/2013    Procedure: COLONOSCOPY;  Surgeon: Lafayette Dragon, MD;  Location: [facility] ENDOSCOPY;  Service: Endoscopy;  Laterality: N/A;  . Cardiac catheterization  ~1990  . Coronary angioplasty with stent placement  2009+    "3 at least; put in 1 stent at a time" (02/11/2013)  . Cardiac catheterization  04/12/2015    Procedure: Coronary Stent Intervention;  Surgeon: Jettie Booze, MD;    SYNERGY DES 3.5X16 to the ostial RCA   . Cardiac catheterization  04/12/2015    Procedure: Coronary/Graft Angiography;  Surgeon: Eloy End, MD; LAD & CFX 100%, patent LIMA-LAD, SVG-D1; SVG-OM-PDA first limb 100%, 2nd limb patent; oRCA 75%>0 w/ stent  . Flexible sigmoidoscopy N/A 04/21/2015    Procedure: FLEXIBLE SIGMOIDOSCOPY;  Surgeon: Ladene Artist, MD;  Location: [facility] ENDOSCOPY;  Service: Endoscopy;  Laterality: N/A;    Current Medications: Outpatient Prescriptions Prior to Visit  Medication Sig Dispense Refill  . atorvastatin (LIPITOR) 80 MG tablet TAKE ONE TABLET BY MOUTH ONCE DAILY 30 tablet 10  . Canagliflozin (INVOKANA) 100 MG TABS Take 100 mg by mouth daily.     . clopidogrel (PLAVIX) 75 MG tablet Take 1 tablet (75 mg total) by mouth daily with breakfast. 30 tablet 11  . COUMADIN 5 MG tablet USE AS DIRECTED BY  COUMADIN  CLINIC 40 tablet 3  . digoxin (LANOXIN) 0.125 MG tablet Take 0.125 mg by mouth daily.    Marland Kitchen FeFum-FePo-FA-B Cmp-C-Zn-Mn-Cu (SE-TAN PLUS) 162-115.2-1 MG CAPS Take 1 tablet by mouth 2 (two) times daily.  3  . folic acid (FOLVITE) A999333 MCG tablet Take 400 mcg by mouth every evening.     . furosemide (LASIX) 40 MG tablet Take 1 tablet (40 mg total) by mouth daily. 30 tablet 11  . glimepiride (AMARYL) 4 MG tablet Take 4 mg by mouth daily before breakfast.    . isosorbide mononitrate (IMDUR) 60 MG 24 hr tablet TAKE ONE TABLET BY MOUTH ONCE DAILY 30 tablet 4  . JANUVIA 50 MG tablet Take 50 mg by mouth daily.     . metoprolol tartrate (LOPRESSOR) 25 MG tablet Take 0.5 tablets (12.5 mg total) by mouth 2 (two) times daily. 60 tablet 11  . Multiple Vitamin (MULTIVITAMIN WITH MINERALS) TABS Take 1 tablet by mouth daily.    . nitroGLYCERIN (NITROSTAT) 0.4 MG SL tablet Place 1 tablet (0.4 mg total) under the tongue every 5 (five) minutes as needed for chest pain. 25 tablet 1  . Omega-3 Fatty Acids 1200 MG CAPS Take 1 capsule by mouth 2 (two) times daily.     . pantoprazole (PROTONIX) 40 MG tablet Take 1 tablet (40 mg total) by mouth daily at 6 (six) AM. 30 tablet 11  . potassium chloride (K-DUR,KLOR-CON) 10 MEQ  tablet Take 20 mEq by mouth 2 (two) times daily.     Marland Kitchen sulfamethoxazole-trimethoprim (BACTRIM,SEPTRA) 200-40 MG/5ML suspension Take by mouth 2 (two) times daily.    . valsartan (DIOVAN) 80 MG tablet Take 1 tablet (80 mg total) by mouth daily.    Marland Kitchen ZETIA 10 MG tablet TAKE ONE TABLET BY MOUTH ONCE DAILY. PLEASE CALL TO MAKE APPT. 90 tablet 3   No facility-administered medications prior to visit.     Allergies:   Darvon; Lisinopril; Septra; Warfarin and related; and Penicillins   Social History   Social History  . Marital Status:  Married    Spouse Name: N/A  . Number of Children: N/A  . Years of Education: N/A   Social History Main Topics  . Smoking status: Never Smoker   . Smokeless tobacco: Never Used  . Alcohol Use: No  . Drug Use: No  . Sexual Activity: Yes   Other Topics Concern  . None   Social History Narrative     Family History:  The patient's family history includes Heart disease in her father and mother.   ROS:   Please see the history of present illness.    Review of Systems  Respiratory: Positive for cough.   All other systems reviewed and are negative.   Physical Exam:    VS:  BP 140/50 mmHg  Pulse 58  Ht 5' (1.524 m)  Wt 139 lb (63.05 kg)  BMI 27.15 kg/m2   GEN: Well nourished, well developed, in no acute distress HEENT: normal Neck: no JVD, no masses Cardiac: Normal S1/Mechanical S2, RRR; early 2/6 systolic murmur RUSB,   no edema;    Respiratory:  clear to auscultation bilaterally; no wheezing, rhonchi or rales GI: soft, nontender  MS: no deformity or atrophy Skin: warm and dry, no rash Neuro:   no focal deficits  Psych: Alert and oriented x 3, normal affect  Wt Readings from Last 3 Encounters:  11/05/15 139 lb (63.05 kg)  07/05/15 139 lb 3.2 oz (63.141 kg)  05/03/15 134 lb 12.8 oz (61.145 kg)      Studies/Labs Reviewed:     EKG:  EKG is  ordered today.  The ekg ordered today demonstrates sinus brady, HR 58, 1st degree AVB, PR 216 ms,  LAD, IVCD, QTc 386 ms, no significant change since prior tracing.    Recent Labs: 04/05/2015: Pro B Natriuretic peptide (BNP) 171.0* 05/01/2015: ALT 23 07/05/2015: BUN 26*; Creat 1.60*; Hemoglobin 12.2; Platelets 239; Potassium 4.7; Sodium 136   Recent Lipid Panel    Component Value Date/Time   CHOL 172 05/01/2015 1234   TRIG 248* 05/01/2015 1234   HDL 46 05/01/2015 1234   LDLCALC 76 05/01/2015 1234    Additional studies/ records that were reviewed today include:   Echo 04/10/15 Mild LVH, EF 60-65%, AVR ok (peak and mean 57 and 28 mmHg), mild MR, MAC, mild MR, mild LAE  LHC 04/12/15  LM: Ostial 99% LAD: Occluded LCx: Occluded RCA: Ostial 75%, mid stent patent, RPAVB stent ok L-LAD ok S-D2 ok S-OM/RPLB occluded  PCI: STENT SYNERGY DES 3.5X16 to [facility]  Carotid US 11/30/14 R 60-79%; L 40-59% Normal subclavian arteries FU 6 mos  Myoview 03/2011 Inf-lat ischemia, EF 82%  ASSESSMENT:     1. Coronary artery disease involving native coronary artery of native heart without angina pectoris   2. Essential hypertension   3. History of lower GI bleeding   4. CKD (chronic kidney disease), unspecified stage   5. S/P AVR (aortic valve replacement)   6. Carotid stenosis, bilateral   7. PAF (paroxysmal atrial fibrillation) ([place])   8. Chronic diastolic CHF (congestive heart failure) ([facility])      PLAN:     In order of problems listed above:  1. CAD - No recurrent angina.  Continue Plavix for 12 mos. D/w Dr. Casandra Doffing.  At 12 mos post PCI, will likely just leave on Coumadin to avoid bleeding.  Continue Lipitor, Zetia, nitrates, beta-blocker.  FU with Dr. Casandra Doffing 03/2016.  2. HTN - BP fairly well controlled. Continue current rx.   3.  History of lower GI bleeding -  No recurrence. This was likely related to small bowel AVMs.   4. CKD (chronic kidney disease), unspecified stage - Recent Creatinine stable.  5. S/P AVR (aortic valve replacement) -Continue Coumadin. Continue  SBE prophylaxis.  6. Carotid stenosis, bilateral - She is overdue for carotid dopplers. Will arrange.   7. PAF (paroxysmal atrial fibrillation) - Maintaining NSR. Continue Coumadin.  8. Chronic diastolic CHF (congestive heart failure) - Continue current dose of Lasix. Volume appears stable.     Medication Adjustments/Labs and Tests Ordered: Current medicines are reviewed at length with the patient today.  Concerns regarding medicines are outlined above.  Medication changes, Labs and Tests ordered today are outlined in the Patient Instructions noted below. Patient Instructions  Medication Instructions:  Your physician recommends that you continue on your current medications as directed. Please refer to the Current Medication list given to you today.  Labwork: NONE  Testing/Procedures: Your physician has requested that you have a carotid duplex. This test is an ultrasound of the carotid arteries in your neck. It looks at blood flow through these arteries that supply the brain with blood. Allow one hour for this exam. There are no restrictions or special instructions.  Follow-Up: FOLLOW UP WITH DR. VARANASI IN 03/2016; WE WILL SEND OUOT A REMINDER LETTER A COUPLE OF MONTHS EARLIER TO CALL AND MAKE AN APPT.   Any Other Special Instructions Will Be Listed Below (If Applicable).  If you need a refill on your cardiac medications before your next appointment, please call your pharmacy.      Signed, Richardson Dopp, PA-C  11/05/2015 12:03 PM    [facility] [address], [place], [place]  [address] Phone: [phone]; Fax: [phone]

## 2015-11-05 ENCOUNTER — Ambulatory Visit (INDEPENDENT_AMBULATORY_CARE_PROVIDER_SITE_OTHER): Payer: Medicare Other | Admitting: Pharmacist

## 2015-11-05 ENCOUNTER — Encounter: Payer: Self-pay | Admitting: Physician Assistant

## 2015-11-05 ENCOUNTER — Ambulatory Visit (INDEPENDENT_AMBULATORY_CARE_PROVIDER_SITE_OTHER): Payer: Medicare Other | Admitting: Physician Assistant

## 2015-11-05 VITALS — BP 140/50 | HR 58 | Ht 60.0 in | Wt 139.0 lb

## 2015-11-05 DIAGNOSIS — I359 Nonrheumatic aortic valve disorder, unspecified: Secondary | ICD-10-CM

## 2015-11-05 DIAGNOSIS — I480 Paroxysmal atrial fibrillation: Secondary | ICD-10-CM

## 2015-11-05 DIAGNOSIS — Z954 Presence of other heart-valve replacement: Secondary | ICD-10-CM

## 2015-11-05 DIAGNOSIS — I2510 Atherosclerotic heart disease of native coronary artery without angina pectoris: Secondary | ICD-10-CM | POA: Diagnosis not present

## 2015-11-05 DIAGNOSIS — I10 Essential (primary) hypertension: Secondary | ICD-10-CM | POA: Diagnosis not present

## 2015-11-05 DIAGNOSIS — Z8719 Personal history of other diseases of the digestive system: Secondary | ICD-10-CM | POA: Diagnosis not present

## 2015-11-05 DIAGNOSIS — N189 Chronic kidney disease, unspecified: Secondary | ICD-10-CM

## 2015-11-05 DIAGNOSIS — Z952 Presence of prosthetic heart valve: Secondary | ICD-10-CM

## 2015-11-05 DIAGNOSIS — I6523 Occlusion and stenosis of bilateral carotid arteries: Secondary | ICD-10-CM | POA: Diagnosis not present

## 2015-11-05 DIAGNOSIS — Z5181 Encounter for therapeutic drug level monitoring: Secondary | ICD-10-CM

## 2015-11-05 DIAGNOSIS — I5032 Chronic diastolic (congestive) heart failure: Secondary | ICD-10-CM

## 2015-11-05 LAB — POCT INR: INR: 1.8

## 2015-11-05 NOTE — Patient Instructions (Addendum)
Medication Instructions:  Your physician recommends that you continue on your current medications as directed. Please refer to the Current Medication list given to you today.  Labwork: NONE  Testing/Procedures: Your physician has requested that you have a carotid duplex. This test is an ultrasound of the carotid arteries in your neck. It looks at blood flow through these arteries that supply the brain with blood. Allow one hour for this exam. There are no restrictions or special instructions.  Follow-Up: FOLLOW UP WITH DR. VARANASI IN 03/2016; WE WILL SEND OUOT A REMINDER LETTER A COUPLE OF MONTHS EARLIER TO CALL AND MAKE AN APPT.   Any Other Special Instructions Will Be Listed Below (If Applicable).  If you need a refill on your cardiac medications before your next appointment, please call your pharmacy.

## 2015-11-06 DIAGNOSIS — H353130 Nonexudative age-related macular degeneration, bilateral, stage unspecified: Secondary | ICD-10-CM | POA: Diagnosis not present

## 2015-11-06 DIAGNOSIS — H2512 Age-related nuclear cataract, left eye: Secondary | ICD-10-CM | POA: Diagnosis not present

## 2015-11-06 DIAGNOSIS — I10 Essential (primary) hypertension: Secondary | ICD-10-CM | POA: Diagnosis not present

## 2015-11-06 DIAGNOSIS — E119 Type 2 diabetes mellitus without complications: Secondary | ICD-10-CM | POA: Diagnosis not present

## 2015-11-06 DIAGNOSIS — H2511 Age-related nuclear cataract, right eye: Secondary | ICD-10-CM | POA: Diagnosis not present

## 2015-11-13 ENCOUNTER — Ambulatory Visit (HOSPITAL_COMMUNITY): Admission: RE | Admit: 2015-11-13 | Payer: Medicare Other | Source: Ambulatory Visit

## 2015-11-20 DIAGNOSIS — J069 Acute upper respiratory infection, unspecified: Secondary | ICD-10-CM | POA: Diagnosis not present

## 2015-11-20 DIAGNOSIS — R319 Hematuria, unspecified: Secondary | ICD-10-CM | POA: Diagnosis not present

## 2015-11-21 ENCOUNTER — Encounter: Payer: Self-pay | Admitting: Physician Assistant

## 2015-11-21 ENCOUNTER — Ambulatory Visit (HOSPITAL_COMMUNITY)
Admission: RE | Admit: 2015-11-21 | Discharge: 2015-11-21 | Disposition: A | Discharge status: Home / Self Care | Payer: Medicare Other | Source: Ambulatory Visit | Attending: Cardiology | Admitting: Cardiology

## 2015-11-21 DIAGNOSIS — I130 Hypertensive heart and chronic kidney disease with heart failure and stage 1 through stage 4 chronic kidney disease, or unspecified chronic kidney disease: Secondary | ICD-10-CM | POA: Insufficient documentation

## 2015-11-21 DIAGNOSIS — I6523 Occlusion and stenosis of bilateral carotid arteries: Secondary | ICD-10-CM | POA: Diagnosis not present

## 2015-11-21 DIAGNOSIS — E785 Hyperlipidemia, unspecified: Secondary | ICD-10-CM | POA: Diagnosis not present

## 2015-11-21 DIAGNOSIS — N183 Chronic kidney disease, stage 3 (moderate): Secondary | ICD-10-CM | POA: Insufficient documentation

## 2015-11-21 DIAGNOSIS — E1122 Type 2 diabetes mellitus with diabetic chronic kidney disease: Secondary | ICD-10-CM | POA: Diagnosis not present

## 2015-11-21 DIAGNOSIS — I509 Heart failure, unspecified: Secondary | ICD-10-CM | POA: Insufficient documentation

## 2015-11-22 ENCOUNTER — Telehealth: Payer: Self-pay | Admitting: *Deleted

## 2015-11-22 DIAGNOSIS — I6523 Occlusion and stenosis of bilateral carotid arteries: Secondary | ICD-10-CM

## 2015-11-22 NOTE — Telephone Encounter (Signed)
Pt notified of carotid US results and findings. Pt agreeable to repeat carotids in 1 yr.

## 2015-11-27 ENCOUNTER — Ambulatory Visit (INDEPENDENT_AMBULATORY_CARE_PROVIDER_SITE_OTHER): Payer: Medicare Other | Admitting: *Deleted

## 2015-11-27 DIAGNOSIS — I359 Nonrheumatic aortic valve disorder, unspecified: Secondary | ICD-10-CM

## 2015-11-27 DIAGNOSIS — Z954 Presence of other heart-valve replacement: Secondary | ICD-10-CM

## 2015-11-27 DIAGNOSIS — Z5181 Encounter for therapeutic drug level monitoring: Secondary | ICD-10-CM

## 2015-11-27 DIAGNOSIS — Z952 Presence of prosthetic heart valve: Secondary | ICD-10-CM

## 2015-11-27 LAB — POCT INR: INR: 1.7

## 2015-12-11 ENCOUNTER — Ambulatory Visit (INDEPENDENT_AMBULATORY_CARE_PROVIDER_SITE_OTHER): Payer: Medicare Other | Admitting: *Deleted

## 2015-12-11 DIAGNOSIS — Z954 Presence of other heart-valve replacement: Secondary | ICD-10-CM

## 2015-12-11 DIAGNOSIS — Z952 Presence of prosthetic heart valve: Secondary | ICD-10-CM

## 2015-12-11 DIAGNOSIS — I359 Nonrheumatic aortic valve disorder, unspecified: Secondary | ICD-10-CM

## 2015-12-11 DIAGNOSIS — Z5181 Encounter for therapeutic drug level monitoring: Secondary | ICD-10-CM

## 2015-12-11 LAB — POCT INR: INR: 2.2

## 2015-12-18 DIAGNOSIS — H353112 Nonexudative age-related macular degeneration, right eye, intermediate dry stage: Secondary | ICD-10-CM | POA: Diagnosis not present

## 2015-12-18 DIAGNOSIS — H35423 Microcystoid degeneration of retina, bilateral: Secondary | ICD-10-CM | POA: Diagnosis not present

## 2015-12-18 DIAGNOSIS — E113293 Type 2 diabetes mellitus with mild nonproliferative diabetic retinopathy without macular edema, bilateral: Secondary | ICD-10-CM | POA: Diagnosis not present

## 2015-12-18 DIAGNOSIS — H353221 Exudative age-related macular degeneration, left eye, with active choroidal neovascularization: Secondary | ICD-10-CM | POA: Diagnosis not present

## 2015-12-24 DIAGNOSIS — H2512 Age-related nuclear cataract, left eye: Secondary | ICD-10-CM | POA: Diagnosis not present

## 2015-12-24 DIAGNOSIS — H25012 Cortical age-related cataract, left eye: Secondary | ICD-10-CM | POA: Diagnosis not present

## 2015-12-24 DIAGNOSIS — H25812 Combined forms of age-related cataract, left eye: Secondary | ICD-10-CM | POA: Diagnosis not present

## 2015-12-25 DIAGNOSIS — H2511 Age-related nuclear cataract, right eye: Secondary | ICD-10-CM | POA: Diagnosis not present

## 2015-12-29 ENCOUNTER — Other Ambulatory Visit: Payer: Self-pay | Admitting: Interventional Cardiology

## 2015-12-31 DIAGNOSIS — H25012 Cortical age-related cataract, left eye: Secondary | ICD-10-CM | POA: Diagnosis not present

## 2016-01-01 ENCOUNTER — Ambulatory Visit (INDEPENDENT_AMBULATORY_CARE_PROVIDER_SITE_OTHER): Payer: Medicare Other | Admitting: Surgery

## 2016-01-01 DIAGNOSIS — I359 Nonrheumatic aortic valve disorder, unspecified: Secondary | ICD-10-CM

## 2016-01-01 DIAGNOSIS — Z954 Presence of other heart-valve replacement: Secondary | ICD-10-CM | POA: Diagnosis not present

## 2016-01-01 DIAGNOSIS — Z5181 Encounter for therapeutic drug level monitoring: Secondary | ICD-10-CM

## 2016-01-01 DIAGNOSIS — Z952 Presence of prosthetic heart valve: Secondary | ICD-10-CM

## 2016-01-01 LAB — POCT INR: INR: 3.4

## 2016-01-14 DIAGNOSIS — H2513 Age-related nuclear cataract, bilateral: Secondary | ICD-10-CM | POA: Diagnosis not present

## 2016-01-14 DIAGNOSIS — H2511 Age-related nuclear cataract, right eye: Secondary | ICD-10-CM | POA: Diagnosis not present

## 2016-01-14 DIAGNOSIS — H25811 Combined forms of age-related cataract, right eye: Secondary | ICD-10-CM | POA: Diagnosis not present

## 2016-01-15 ENCOUNTER — Ambulatory Visit (INDEPENDENT_AMBULATORY_CARE_PROVIDER_SITE_OTHER): Payer: Medicare Other | Admitting: *Deleted

## 2016-01-15 DIAGNOSIS — I359 Nonrheumatic aortic valve disorder, unspecified: Secondary | ICD-10-CM | POA: Diagnosis not present

## 2016-01-15 DIAGNOSIS — Z954 Presence of other heart-valve replacement: Secondary | ICD-10-CM | POA: Diagnosis not present

## 2016-01-15 DIAGNOSIS — Z5181 Encounter for therapeutic drug level monitoring: Secondary | ICD-10-CM

## 2016-01-15 DIAGNOSIS — Z952 Presence of prosthetic heart valve: Secondary | ICD-10-CM

## 2016-01-15 LAB — POCT INR: INR: 2.6

## 2016-01-19 ENCOUNTER — Other Ambulatory Visit: Payer: Self-pay | Admitting: Interventional Cardiology

## 2016-01-29 ENCOUNTER — Ambulatory Visit (INDEPENDENT_AMBULATORY_CARE_PROVIDER_SITE_OTHER): Payer: Medicare Other | Admitting: *Deleted

## 2016-01-29 DIAGNOSIS — I359 Nonrheumatic aortic valve disorder, unspecified: Secondary | ICD-10-CM

## 2016-01-29 DIAGNOSIS — Z5181 Encounter for therapeutic drug level monitoring: Secondary | ICD-10-CM | POA: Diagnosis not present

## 2016-01-29 DIAGNOSIS — Z952 Presence of prosthetic heart valve: Secondary | ICD-10-CM

## 2016-01-29 DIAGNOSIS — Z954 Presence of other heart-valve replacement: Secondary | ICD-10-CM

## 2016-01-29 LAB — POCT INR: INR: 3.5

## 2016-02-05 DIAGNOSIS — R809 Proteinuria, unspecified: Secondary | ICD-10-CM | POA: Diagnosis not present

## 2016-02-05 DIAGNOSIS — Z0000 Encounter for general adult medical examination without abnormal findings: Secondary | ICD-10-CM | POA: Diagnosis not present

## 2016-02-05 DIAGNOSIS — N183 Chronic kidney disease, stage 3 (moderate): Secondary | ICD-10-CM | POA: Diagnosis not present

## 2016-02-05 DIAGNOSIS — Z952 Presence of prosthetic heart valve: Secondary | ICD-10-CM | POA: Diagnosis not present

## 2016-02-05 DIAGNOSIS — Z1389 Encounter for screening for other disorder: Secondary | ICD-10-CM | POA: Diagnosis not present

## 2016-02-05 DIAGNOSIS — D649 Anemia, unspecified: Secondary | ICD-10-CM | POA: Diagnosis not present

## 2016-02-05 DIAGNOSIS — Z7984 Long term (current) use of oral hypoglycemic drugs: Secondary | ICD-10-CM | POA: Diagnosis not present

## 2016-02-05 DIAGNOSIS — E1139 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: Secondary | ICD-10-CM | POA: Diagnosis not present

## 2016-02-05 DIAGNOSIS — I130 Hypertensive heart and chronic kidney disease with heart failure and stage 1 through stage 4 chronic kidney disease, or unspecified chronic kidney disease: Secondary | ICD-10-CM | POA: Diagnosis not present

## 2016-02-05 DIAGNOSIS — E7800 Pure hypercholesterolemia, unspecified: Secondary | ICD-10-CM | POA: Diagnosis not present

## 2016-02-05 DIAGNOSIS — H3530 Unspecified macular degeneration: Secondary | ICD-10-CM | POA: Diagnosis not present

## 2016-02-06 ENCOUNTER — Encounter: Payer: Self-pay | Admitting: Interventional Cardiology

## 2016-02-08 ENCOUNTER — Ambulatory Visit (INDEPENDENT_AMBULATORY_CARE_PROVIDER_SITE_OTHER): Payer: Medicare Other | Admitting: *Deleted

## 2016-02-08 DIAGNOSIS — Z954 Presence of other heart-valve replacement: Secondary | ICD-10-CM | POA: Diagnosis not present

## 2016-02-08 DIAGNOSIS — I359 Nonrheumatic aortic valve disorder, unspecified: Secondary | ICD-10-CM | POA: Diagnosis not present

## 2016-02-08 DIAGNOSIS — Z952 Presence of prosthetic heart valve: Secondary | ICD-10-CM

## 2016-02-08 DIAGNOSIS — Z5181 Encounter for therapeutic drug level monitoring: Secondary | ICD-10-CM | POA: Diagnosis not present

## 2016-02-08 LAB — POCT INR: INR: 2.3

## 2016-02-22 ENCOUNTER — Ambulatory Visit (INDEPENDENT_AMBULATORY_CARE_PROVIDER_SITE_OTHER): Payer: Medicare Other | Admitting: *Deleted

## 2016-02-22 DIAGNOSIS — Z952 Presence of prosthetic heart valve: Secondary | ICD-10-CM

## 2016-02-22 DIAGNOSIS — Z5181 Encounter for therapeutic drug level monitoring: Secondary | ICD-10-CM | POA: Diagnosis not present

## 2016-02-22 DIAGNOSIS — I359 Nonrheumatic aortic valve disorder, unspecified: Secondary | ICD-10-CM | POA: Diagnosis not present

## 2016-02-22 DIAGNOSIS — Z954 Presence of other heart-valve replacement: Secondary | ICD-10-CM | POA: Diagnosis not present

## 2016-02-22 LAB — POCT INR: INR: 2.4

## 2016-02-29 DIAGNOSIS — H353112 Nonexudative age-related macular degeneration, right eye, intermediate dry stage: Secondary | ICD-10-CM | POA: Diagnosis not present

## 2016-02-29 DIAGNOSIS — E113293 Type 2 diabetes mellitus with mild nonproliferative diabetic retinopathy without macular edema, bilateral: Secondary | ICD-10-CM | POA: Diagnosis not present

## 2016-02-29 DIAGNOSIS — H35423 Microcystoid degeneration of retina, bilateral: Secondary | ICD-10-CM | POA: Diagnosis not present

## 2016-02-29 DIAGNOSIS — H353221 Exudative age-related macular degeneration, left eye, with active choroidal neovascularization: Secondary | ICD-10-CM | POA: Diagnosis not present

## 2016-03-13 ENCOUNTER — Encounter: Payer: Self-pay | Admitting: Interventional Cardiology

## 2016-03-14 ENCOUNTER — Ambulatory Visit (INDEPENDENT_AMBULATORY_CARE_PROVIDER_SITE_OTHER): Payer: Medicare Other

## 2016-03-14 DIAGNOSIS — Z952 Presence of prosthetic heart valve: Secondary | ICD-10-CM

## 2016-03-14 DIAGNOSIS — I359 Nonrheumatic aortic valve disorder, unspecified: Secondary | ICD-10-CM

## 2016-03-14 DIAGNOSIS — Z954 Presence of other heart-valve replacement: Secondary | ICD-10-CM | POA: Diagnosis not present

## 2016-03-14 DIAGNOSIS — Z5181 Encounter for therapeutic drug level monitoring: Secondary | ICD-10-CM | POA: Diagnosis not present

## 2016-03-14 LAB — POCT INR: INR: 2.7

## 2016-03-28 ENCOUNTER — Ambulatory Visit (INDEPENDENT_AMBULATORY_CARE_PROVIDER_SITE_OTHER): Payer: Medicare Other | Admitting: *Deleted

## 2016-03-28 DIAGNOSIS — Z952 Presence of prosthetic heart valve: Secondary | ICD-10-CM

## 2016-03-28 DIAGNOSIS — I359 Nonrheumatic aortic valve disorder, unspecified: Secondary | ICD-10-CM

## 2016-03-28 DIAGNOSIS — Z5181 Encounter for therapeutic drug level monitoring: Secondary | ICD-10-CM

## 2016-03-28 DIAGNOSIS — Z954 Presence of other heart-valve replacement: Secondary | ICD-10-CM

## 2016-03-28 LAB — POCT INR: INR: 2

## 2016-03-29 ENCOUNTER — Other Ambulatory Visit: Payer: Self-pay | Admitting: Physician Assistant

## 2016-04-16 ENCOUNTER — Ambulatory Visit: Payer: Medicare Other | Admitting: Interventional Cardiology

## 2016-04-17 ENCOUNTER — Ambulatory Visit (INDEPENDENT_AMBULATORY_CARE_PROVIDER_SITE_OTHER): Payer: Medicare Other | Admitting: *Deleted

## 2016-04-17 ENCOUNTER — Encounter: Payer: Self-pay | Admitting: Interventional Cardiology

## 2016-04-17 ENCOUNTER — Ambulatory Visit (INDEPENDENT_AMBULATORY_CARE_PROVIDER_SITE_OTHER): Payer: Medicare Other | Admitting: Interventional Cardiology

## 2016-04-17 VITALS — BP 152/60 | HR 68 | Ht 60.0 in | Wt 140.0 lb

## 2016-04-17 DIAGNOSIS — Z5181 Encounter for therapeutic drug level monitoring: Secondary | ICD-10-CM

## 2016-04-17 DIAGNOSIS — I2583 Coronary atherosclerosis due to lipid rich plaque: Principal | ICD-10-CM

## 2016-04-17 DIAGNOSIS — E785 Hyperlipidemia, unspecified: Secondary | ICD-10-CM

## 2016-04-17 DIAGNOSIS — Z7901 Long term (current) use of anticoagulants: Secondary | ICD-10-CM

## 2016-04-17 DIAGNOSIS — Z954 Presence of other heart-valve replacement: Secondary | ICD-10-CM

## 2016-04-17 DIAGNOSIS — I2510 Atherosclerotic heart disease of native coronary artery without angina pectoris: Secondary | ICD-10-CM | POA: Diagnosis not present

## 2016-04-17 DIAGNOSIS — Z952 Presence of prosthetic heart valve: Secondary | ICD-10-CM

## 2016-04-17 DIAGNOSIS — I10 Essential (primary) hypertension: Secondary | ICD-10-CM

## 2016-04-17 DIAGNOSIS — I359 Nonrheumatic aortic valve disorder, unspecified: Secondary | ICD-10-CM

## 2016-04-17 DIAGNOSIS — I6523 Occlusion and stenosis of bilateral carotid arteries: Secondary | ICD-10-CM

## 2016-04-17 LAB — POCT INR: INR: 2.9

## 2016-04-17 MED ORDER — ASPIRIN EC 81 MG PO TBEC: 81.0000 mg | DELAYED_RELEASE_TABLET | Freq: Every day | ORAL | Status: DC

## 2016-04-17 NOTE — Progress Notes (Signed)
Cardiology Office Note   Date:  04/17/2016   ID:  [name], DOB 05-15-1933, MRN [id number]  PCP:  Simona Huh, MD    No chief complaint on file. CAD   Wt Readings from Last 3 Encounters:  04/17/16 140 lb (63.5 kg)  11/05/15 139 lb (63 kg)  07/05/15 139 lb 3.2 oz (63.1 kg)       History of Present Illness: [name] is a 82 y.o. female  with a hx of CAD and aortic valve disease status post CABG plus mechanical AVR, subsequent PCI to the RCA with BMS, HTN, HL, diabetes, prior GI bleed, CKD, diastolic CHF, PAF.   She was evaluated in 7/16 for anginal symptoms.  LHC demonstrated severe native three-vessel disease. LIMA-LAD was patent, SVG-diagonal patent, SVG-OM/PDA occluded. The second half of the OM/PDA graft was open. RCA stents were patent. She had an ostial RCA lesion of 75% which was treated with a Synergy DES. Because she needs Coumadin for her mechanical AVR, she was continued on Coumadin plus Plavix only (no aspirin).  She had some melena a few months ago.  Her Hbg was around 10. Her INR was slightly higher than usual. She did not go to the hospital. Once her INR came back down to the usual 2.5 range, the dark stool stopped. She has not had a hemoglobin checked since that time. She has felt well. She continues to exercise. She uses the treadmill. Her only complaint is a cough that she thinks is related to seasonal allergies. No problems lying flat. No problems: Sleep at night. No signs of fluid overload.  She is also wondering about stopping her protonic. I asked her to check with Dr. Delfin Edis.    Past Medical History:  Diagnosis Date  . Anemia   . Anginal pain ([place])   . Arteriovenous malformation of gastrointestinal tract   . Arthritis    "fingers mostly" (04/12/2015)  . Carotid stenosis    Carotid US 3/17:  A999333 RICA; 123456 LICA; Elevated bilateral subclavian artery velocities >>f/u 1 year.  . CHF (congestive heart failure) ([place])   . Chronic  kidney disease (CKD), stage III (moderate)   . Chronic lower GI bleeding    "today; last time was ~ 8 yr ago; used to have them often before that too" (02/11/2013)  . Coronary artery disease   . DJD (degenerative joint disease)   . Heart murmur   . History of blood transfusion    "a few times over the years; usually related to my Coumadin" (04/12/2015  . History of gout   . Hyperlipemia   . Hypertension   . Macula lutea degeneration   . Old MI (myocardial infarction)    "a coulple /dr in 02/2008; I never even knew I'd had them" (04/12/2015)  . Type II diabetes mellitus ([place])     Past Surgical History:  Procedure Laterality Date  . AORTIC VALVE REPLACEMENT  1993  . APPENDECTOMY  1953  . CARDIAC CATHETERIZATION  ~1990  . CARDIAC CATHETERIZATION  04/12/2015   Procedure: Coronary Stent Intervention;  Surgeon: Jettie Booze, MD;    SYNERGY DES 3.5X16 to the ostial RCA   . CARDIAC CATHETERIZATION  04/12/2015   Procedure: Coronary/Graft Angiography;  Surgeon: Eloy End, MD; LAD & CFX 100%, patent LIMA-LAD, SVG-D1; SVG-OM-PDA first limb 100%, 2nd limb patent; oRCA 75%>0 w/ stent  . [place]   St. Jude/notes 10/29/2003 (02/11/2013)  . COLONOSCOPY N/A 02/13/2013  Procedure: COLONOSCOPY;  Surgeon: Lafayette Dragon, MD;  Location: [facility] ENDOSCOPY;  Service: Endoscopy;  Laterality: N/A;  . CORONARY ANGIOPLASTY WITH STENT PLACEMENT  2009+   "3 at least; put in 1 stent at a time" (02/11/2013)  . CORONARY ARTERY BYPASS GRAFT  1990   LIMA-LAD, SVG-OM-PDA, SVG-D1  . DILATION AND CURETTAGE OF UTERUS  1960's   'after a miscarriage" (02/11/2013)  . ENTEROSCOPY N/A 02/13/2013   Procedure: ENTEROSCOPY;  Surgeon: Lafayette Dragon, MD;  Location: [facility] ENDOSCOPY;  Service: Endoscopy;  Laterality: N/A;  . EXPLORATORY LAPAROTOMY  02/24/2008   which revealed a retroperitoneal hematoma and bleeding from the right external iliac artery/notes 03/02/2008 (02/11/2013)   . FLEXIBLE SIGMOIDOSCOPY N/A  04/21/2015   Procedure: FLEXIBLE SIGMOIDOSCOPY;  Surgeon: Ladene Artist, MD;  Location: [facility] ENDOSCOPY;  Service: Endoscopy;  Laterality: N/A;     Current Outpatient Prescriptions  Medication Sig Dispense Refill  . atorvastatin (LIPITOR) 80 MG tablet TAKE ONE TABLET BY MOUTH ONCE DAILY 30 tablet 10  . bevacizumab (AVASTIN) 1.25 mg/0.1 mL SOLN Place 1.25 mg into the left eye every 8 (eight) weeks.    . Canagliflozin (INVOKANA) 100 MG TABS Take 100 mg by mouth daily.     Marland Kitchen COUMADIN 5 MG tablet USE AS DIRECTED BY  COUMADIN  CLINIC 40 tablet 3  . digoxin (LANOXIN) 0.125 MG tablet Take 0.125 mg by mouth daily.    Marland Kitchen FeFum-FePo-FA-B Cmp-C-Zn-Mn-Cu (SE-TAN PLUS) 162-115.2-1 MG CAPS Take 1 tablet by mouth 2 (two) times daily.  3  . folic acid (FOLVITE) A999333 MCG tablet Take 400 mcg by mouth every evening.     . furosemide (LASIX) 40 MG tablet Take 1 tablet (40 mg total) by mouth daily. 30 tablet 11  . glimepiride (AMARYL) 4 MG tablet Take 4 mg by mouth daily before breakfast.    . isosorbide mononitrate (IMDUR) 60 MG 24 hr tablet TAKE ONE TABLET BY MOUTH ONCE DAILY 30 tablet 9  . JANUVIA 50 MG tablet Take 50 mg by mouth daily.     . metoprolol tartrate (LOPRESSOR) 25 MG tablet Take 0.5 tablets (12.5 mg total) by mouth 2 (two) times daily. 60 tablet 11  . Multiple Vitamin (MULTIVITAMIN WITH MINERALS) TABS Take 1 tablet by mouth daily.    . nitroGLYCERIN (NITROSTAT) 0.4 MG SL tablet Place 1 tablet (0.4 mg total) under the tongue every 5 (five) minutes as needed for chest pain. 25 tablet 1  . Omega-3 Fatty Acids 1200 MG CAPS Take 1 capsule by mouth 2 (two) times daily.     . pantoprazole (PROTONIX) 40 MG tablet Take 1 tablet (40 mg total) by mouth daily at 6 (six) AM. 30 tablet 11  . potassium chloride (K-DUR,KLOR-CON) 10 MEQ tablet Take 20 mEq by mouth 2 (two) times daily.     . valsartan (DIOVAN) 80 MG tablet Take 1 tablet (80 mg total) by mouth daily.    Marland Kitchen ZETIA 10 MG tablet TAKE ONE TABLET BY MOUTH  ONCE DAILY. PLEASE CALL TO MAKE APPT. 90 tablet 3  . aspirin EC 81 MG tablet Take 1 tablet (81 mg total) by mouth daily.     No current facility-administered medications for this visit.     Allergies:   Darvon [propoxyphene]; Lisinopril; Septra [sulfamethoxazole-trimethoprim]; Warfarin and related; and Penicillins    Social History:  The patient  reports that she has never smoked. She has never used smokeless tobacco. She reports that she does not drink alcohol or use drugs.  Family History:  The patient's family history includes Heart disease in her father and mother.    ROS:  Please see the history of present illness.   Otherwise, review of systems are positive for Occasional blood in her stool a few months ago.   All other systems are reviewed and negative.    PHYSICAL EXAM: VS:  BP (!) 152/60   Pulse 68   Ht 5' (1.524 m)   Wt 140 lb (63.5 kg)   BMI 27.34 kg/m  , BMI Body mass index is 27.34 kg/m. GEN: Well nourished, well developed, in no acute distress  HEENT: normal  Neck: no JVD, carotid bruits, or masses Cardiac: RRR; crisp S2,  1/6 systolic murmur, no rubs, or gallops,no edema  Respiratory:  clear to auscultation bilaterally, normal work of breathing GI: soft, nontender, nondistended, + BS MS: no deformity or atrophy  Skin: warm and dry, no rash Neuro:  Strength and sensation are intact Psych: euthymic mood, full affect     Recent Labs: 05/01/2015: ALT 23 07/05/2015: BUN 26; Creat 1.60; Hemoglobin 12.2; Platelets 239; Potassium 4.7; Sodium 136   Lipid Panel    Component Value Date/Time   CHOL 172 05/01/2015 1234   TRIG 248 (H) 05/01/2015 1234   HDL 46 05/01/2015 1234   LDLCALC 76 05/01/2015 1234     Other studies Reviewed: Additional studies/ records that were reviewed today with results demonstrating: Cath results reviewed.   ASSESSMENT AND PLAN:  1. CAD: Angina controlled on medications. Continue exercise. Okay to stop Plavix at this point since  it's been a year since her stent. Restart aspirin 81 mg daily. 2. AVR: Continue Coumadin to prevent valve thrombosis. 3. Hypertension: Blood pressure controlled at other checks. Mildly elevated today. Continue to monitor. 4. Hyperlipidemia: Continue lipid-lowering therapy. Continue healthy diet. She has had issues with mildly elevated triglycerides as well. She is hoping to avoid any additional pills.   Current medicines are reviewed at length with the patient today.  The patient concerns regarding her medicines were addressed.  The following changes have been made:  Stop Plavix  Labs/ tests ordered today include:  No orders of the defined types were placed in this encounter.   Recommend 150 minutes/week of aerobic exercise Low fat, low carb, high fiber diet recommended  Disposition:   FU in 6 months   Signed, Larae Grooms, MD  04/17/2016 [facility] [place], [place], [place]  [address] Phone: [phone]; Fax: [phone]

## 2016-04-17 NOTE — Patient Instructions (Signed)
Medication Instructions:  Stop taking Plavix and start taking Aspirin 81 mg daily. All other medications remain the same.  Labwork: None  Testing/Procedures: None  Follow-Up: Your physician wants you to follow-up in: 6 months. You will receive a reminder letter in the mail two months in advance. If you don't receive a letter, please call our office to schedule the follow-up appointment.     If you need a refill on your cardiac medications before your next appointment, please call your pharmacy.

## 2016-04-29 ENCOUNTER — Ambulatory Visit (INDEPENDENT_AMBULATORY_CARE_PROVIDER_SITE_OTHER): Payer: Medicare Other | Admitting: *Deleted

## 2016-04-29 DIAGNOSIS — I359 Nonrheumatic aortic valve disorder, unspecified: Secondary | ICD-10-CM | POA: Diagnosis not present

## 2016-04-29 DIAGNOSIS — Z952 Presence of prosthetic heart valve: Secondary | ICD-10-CM

## 2016-04-29 DIAGNOSIS — Z954 Presence of other heart-valve replacement: Secondary | ICD-10-CM

## 2016-04-29 DIAGNOSIS — Z5181 Encounter for therapeutic drug level monitoring: Secondary | ICD-10-CM | POA: Diagnosis not present

## 2016-04-29 LAB — POCT INR: INR: 2

## 2016-05-02 DIAGNOSIS — H35423 Microcystoid degeneration of retina, bilateral: Secondary | ICD-10-CM | POA: Diagnosis not present

## 2016-05-02 DIAGNOSIS — E113293 Type 2 diabetes mellitus with mild nonproliferative diabetic retinopathy without macular edema, bilateral: Secondary | ICD-10-CM | POA: Diagnosis not present

## 2016-05-02 DIAGNOSIS — H353221 Exudative age-related macular degeneration, left eye, with active choroidal neovascularization: Secondary | ICD-10-CM | POA: Diagnosis not present

## 2016-05-02 DIAGNOSIS — H353112 Nonexudative age-related macular degeneration, right eye, intermediate dry stage: Secondary | ICD-10-CM | POA: Diagnosis not present

## 2016-05-20 ENCOUNTER — Ambulatory Visit (INDEPENDENT_AMBULATORY_CARE_PROVIDER_SITE_OTHER): Payer: Medicare Other | Admitting: *Deleted

## 2016-05-20 DIAGNOSIS — Z5181 Encounter for therapeutic drug level monitoring: Secondary | ICD-10-CM

## 2016-05-20 DIAGNOSIS — I359 Nonrheumatic aortic valve disorder, unspecified: Secondary | ICD-10-CM | POA: Diagnosis not present

## 2016-05-20 DIAGNOSIS — Z952 Presence of prosthetic heart valve: Secondary | ICD-10-CM

## 2016-05-20 DIAGNOSIS — Z954 Presence of other heart-valve replacement: Secondary | ICD-10-CM

## 2016-05-20 LAB — POCT INR: INR: 2.2

## 2016-05-24 ENCOUNTER — Other Ambulatory Visit: Payer: Self-pay | Admitting: Interventional Cardiology

## 2016-06-02 DIAGNOSIS — Z23 Encounter for immunization: Secondary | ICD-10-CM | POA: Diagnosis not present

## 2016-06-10 ENCOUNTER — Ambulatory Visit (INDEPENDENT_AMBULATORY_CARE_PROVIDER_SITE_OTHER): Payer: Medicare Other | Admitting: Pharmacist

## 2016-06-10 DIAGNOSIS — I359 Nonrheumatic aortic valve disorder, unspecified: Secondary | ICD-10-CM | POA: Diagnosis not present

## 2016-06-10 DIAGNOSIS — Z954 Presence of other heart-valve replacement: Secondary | ICD-10-CM | POA: Diagnosis not present

## 2016-06-10 DIAGNOSIS — Z5181 Encounter for therapeutic drug level monitoring: Secondary | ICD-10-CM

## 2016-06-10 DIAGNOSIS — Z952 Presence of prosthetic heart valve: Secondary | ICD-10-CM

## 2016-06-10 LAB — POCT INR: INR: 2.6

## 2016-07-01 ENCOUNTER — Ambulatory Visit (INDEPENDENT_AMBULATORY_CARE_PROVIDER_SITE_OTHER): Payer: Medicare Other | Admitting: *Deleted

## 2016-07-01 DIAGNOSIS — Z5181 Encounter for therapeutic drug level monitoring: Secondary | ICD-10-CM | POA: Diagnosis not present

## 2016-07-01 DIAGNOSIS — Z952 Presence of prosthetic heart valve: Secondary | ICD-10-CM | POA: Diagnosis not present

## 2016-07-01 DIAGNOSIS — I359 Nonrheumatic aortic valve disorder, unspecified: Secondary | ICD-10-CM

## 2016-07-01 LAB — POCT INR: INR: 1.9

## 2016-07-04 DIAGNOSIS — H35432 Paving stone degeneration of retina, left eye: Secondary | ICD-10-CM | POA: Diagnosis not present

## 2016-07-04 DIAGNOSIS — E113293 Type 2 diabetes mellitus with mild nonproliferative diabetic retinopathy without macular edema, bilateral: Secondary | ICD-10-CM | POA: Diagnosis not present

## 2016-07-04 DIAGNOSIS — H353112 Nonexudative age-related macular degeneration, right eye, intermediate dry stage: Secondary | ICD-10-CM | POA: Diagnosis not present

## 2016-07-04 DIAGNOSIS — H353221 Exudative age-related macular degeneration, left eye, with active choroidal neovascularization: Secondary | ICD-10-CM | POA: Diagnosis not present

## 2016-07-05 ENCOUNTER — Other Ambulatory Visit: Payer: Self-pay | Admitting: Interventional Cardiology

## 2016-07-12 ENCOUNTER — Other Ambulatory Visit: Payer: Self-pay | Admitting: Interventional Cardiology

## 2016-07-22 ENCOUNTER — Ambulatory Visit (INDEPENDENT_AMBULATORY_CARE_PROVIDER_SITE_OTHER): Payer: Medicare Other

## 2016-07-22 DIAGNOSIS — I359 Nonrheumatic aortic valve disorder, unspecified: Secondary | ICD-10-CM | POA: Diagnosis not present

## 2016-07-22 DIAGNOSIS — Z952 Presence of prosthetic heart valve: Secondary | ICD-10-CM

## 2016-07-22 DIAGNOSIS — Z5181 Encounter for therapeutic drug level monitoring: Secondary | ICD-10-CM | POA: Diagnosis not present

## 2016-07-22 LAB — POCT INR: INR: 2

## 2016-08-11 DIAGNOSIS — H26493 Other secondary cataract, bilateral: Secondary | ICD-10-CM | POA: Diagnosis not present

## 2016-08-11 DIAGNOSIS — H353232 Exudative age-related macular degeneration, bilateral, with inactive choroidal neovascularization: Secondary | ICD-10-CM | POA: Diagnosis not present

## 2016-08-12 ENCOUNTER — Ambulatory Visit (INDEPENDENT_AMBULATORY_CARE_PROVIDER_SITE_OTHER): Payer: Medicare Other | Admitting: Pharmacist Clinician (PhC)/ Clinical Pharmacy Specialist

## 2016-08-12 DIAGNOSIS — Z5181 Encounter for therapeutic drug level monitoring: Secondary | ICD-10-CM | POA: Diagnosis not present

## 2016-08-12 DIAGNOSIS — Z952 Presence of prosthetic heart valve: Secondary | ICD-10-CM | POA: Diagnosis not present

## 2016-08-12 DIAGNOSIS — I6523 Occlusion and stenosis of bilateral carotid arteries: Secondary | ICD-10-CM

## 2016-08-12 DIAGNOSIS — I359 Nonrheumatic aortic valve disorder, unspecified: Secondary | ICD-10-CM

## 2016-08-12 LAB — POCT INR: INR: 2.3

## 2016-09-02 ENCOUNTER — Ambulatory Visit (INDEPENDENT_AMBULATORY_CARE_PROVIDER_SITE_OTHER): Payer: Medicare Other | Admitting: *Deleted

## 2016-09-02 DIAGNOSIS — Z5181 Encounter for therapeutic drug level monitoring: Secondary | ICD-10-CM | POA: Diagnosis not present

## 2016-09-02 DIAGNOSIS — I6523 Occlusion and stenosis of bilateral carotid arteries: Secondary | ICD-10-CM

## 2016-09-02 DIAGNOSIS — I359 Nonrheumatic aortic valve disorder, unspecified: Secondary | ICD-10-CM

## 2016-09-02 DIAGNOSIS — Z952 Presence of prosthetic heart valve: Secondary | ICD-10-CM | POA: Diagnosis not present

## 2016-09-02 LAB — POCT INR: INR: 2.1

## 2016-09-05 DIAGNOSIS — H35432 Paving stone degeneration of retina, left eye: Secondary | ICD-10-CM | POA: Diagnosis not present

## 2016-09-05 DIAGNOSIS — H353112 Nonexudative age-related macular degeneration, right eye, intermediate dry stage: Secondary | ICD-10-CM | POA: Diagnosis not present

## 2016-09-05 DIAGNOSIS — E113293 Type 2 diabetes mellitus with mild nonproliferative diabetic retinopathy without macular edema, bilateral: Secondary | ICD-10-CM | POA: Diagnosis not present

## 2016-09-05 DIAGNOSIS — H353221 Exudative age-related macular degeneration, left eye, with active choroidal neovascularization: Secondary | ICD-10-CM | POA: Diagnosis not present

## 2016-09-23 ENCOUNTER — Ambulatory Visit (INDEPENDENT_AMBULATORY_CARE_PROVIDER_SITE_OTHER): Payer: Medicare Other | Admitting: *Deleted

## 2016-09-23 DIAGNOSIS — Z5181 Encounter for therapeutic drug level monitoring: Secondary | ICD-10-CM

## 2016-09-23 DIAGNOSIS — Z952 Presence of prosthetic heart valve: Secondary | ICD-10-CM | POA: Diagnosis not present

## 2016-09-23 DIAGNOSIS — I359 Nonrheumatic aortic valve disorder, unspecified: Secondary | ICD-10-CM

## 2016-09-23 LAB — POCT INR: INR: 2.3

## 2016-10-15 NOTE — Progress Notes (Signed)
Cardiology Office Note   Date:  10/16/2016   ID:  Pasqualina, Colasurdo 1932/12/12, MRN [id number]  PCP:  Simona Huh, MD    Chief Complaint  Patient presents with  . Follow-up  . Shortness of Breath    pt states sometimes  CAD   Wt Readings from Last 3 Encounters:  10/16/16 138 lb (62.6 kg)  04/17/16 140 lb (63.5 kg)  11/05/15 139 lb (63 kg)       History of Present Illness: [name] is a 83 y.o. female  with a hx of CAD and aortic valve disease status post CABG plus mechanical AVR, subsequent PCI to the RCA with BMS, HTN, HL, diabetes, prior GI bleed, CKD, diastolic CHF, PAF.   She was evaluated in 7/16 for anginal symptoms.  LHC demonstrated severe native three-vessel disease. LIMA-LAD was patent, SVG-diagonal patent, SVG-OM/PDA occluded. The second half of the OM/PDA graft was open. RCA stents were patent. She had an ostial RCA lesion of 75% which was treated with a Synergy DES. Because she needs Coumadin for her mechanical AVR, she was continued on Coumadin plus Plavix only (no aspirin).  She had some melena after the procedure.  Things have been stable since that time. No GI bleeding. She exercises at the gym. No signs of fluid overload. No problems lying flat. No trouble sleeping at night. She does note that her balance is somewhat decreased. She has not fallen. She is careful to try to avoid falling. She is tolerating her medicines well.  Recently, she has had a cold but she has not had any change in her INR.      Past Medical History:  Diagnosis Date  . Anemia   . Anginal pain ([place])   . Arteriovenous malformation of gastrointestinal tract   . Arthritis    "fingers mostly" (04/12/2015)  . Carotid stenosis    Carotid US 3/17:  54-65% RICA; 68-12% LICA; Elevated bilateral subclavian artery velocities >>f/u 1 year.  . CHF (congestive heart failure) ([place])   . Chronic kidney disease (CKD), stage III (moderate)   . Chronic lower GI bleeding    "today;  last time was ~ 8 yr ago; used to have them often before that too" (02/11/2013)  . Coronary artery disease   . DJD (degenerative joint disease)   . Heart murmur   . History of blood transfusion    "a few times over the years; usually related to my Coumadin" (04/12/2015  . History of gout   . Hyperlipemia   . Hypertension   . Macula lutea degeneration   . Old MI (myocardial infarction)    "a coulple /dr in 02/2008; I never even knew I'd had them" (04/12/2015)  . Type II diabetes mellitus ([place])     Past Surgical History:  Procedure Laterality Date  . AORTIC VALVE REPLACEMENT  1993  . APPENDECTOMY  1953  . CARDIAC CATHETERIZATION  ~1990  . CARDIAC CATHETERIZATION  04/12/2015   Procedure: Coronary Stent Intervention;  Surgeon: Jettie Booze, MD;    SYNERGY DES 3.5X16 to the ostial RCA   . CARDIAC CATHETERIZATION  04/12/2015   Procedure: Coronary/Graft Angiography;  Surgeon: Eloy End, MD; LAD & CFX 100%, patent LIMA-LAD, SVG-D1; SVG-OM-PDA first limb 100%, 2nd limb patent; oRCA 75%>0 w/ stent  . [place]   St. Jude/notes 10/29/2003 (02/11/2013)  . COLONOSCOPY N/A 02/13/2013   Procedure: COLONOSCOPY;  Surgeon: Lafayette Dragon, MD;  Location: [facility] ENDOSCOPY;  Service: Endoscopy;  Laterality: N/A;  . CORONARY ANGIOPLASTY WITH STENT PLACEMENT  2009+   "3 at least; put in 1 stent at a time" (02/11/2013)  . CORONARY ARTERY BYPASS GRAFT  1990   LIMA-LAD, SVG-OM-PDA, SVG-D1  . DILATION AND CURETTAGE OF UTERUS  1960's   'after a miscarriage" (02/11/2013)  . ENTEROSCOPY N/A 02/13/2013   Procedure: ENTEROSCOPY;  Surgeon: Lafayette Dragon, MD;  Location: [facility] ENDOSCOPY;  Service: Endoscopy;  Laterality: N/A;  . EXPLORATORY LAPAROTOMY  02/24/2008   which revealed a retroperitoneal hematoma and bleeding from the right external iliac artery/notes 03/02/2008 (02/11/2013)   . FLEXIBLE SIGMOIDOSCOPY N/A 04/21/2015   Procedure: FLEXIBLE SIGMOIDOSCOPY;  Surgeon: Ladene Artist, MD;   Location: [facility] ENDOSCOPY;  Service: Endoscopy;  Laterality: N/A;     Current Outpatient Prescriptions  Medication Sig Dispense Refill  . aspirin EC 81 MG tablet Take 1 tablet (81 mg total) by mouth daily.    Marland Kitchen atorvastatin (LIPITOR) 80 MG tablet TAKE ONE TABLET BY MOUTH ONCE DAILY 90 tablet 3  . bevacizumab (AVASTIN) 1.25 mg/0.1 mL SOLN Place 1.25 mg into the left eye every 8 (eight) weeks.    . Canagliflozin (INVOKANA) 100 MG TABS Take 100 mg by mouth daily.     Marland Kitchen COUMADIN 5 MG tablet USE AS DIRECTED BY COUMADIN CLINIC 40 tablet 3  . digoxin (LANOXIN) 0.125 MG tablet Take 0.125 mg by mouth daily.    Marland Kitchen FeFum-FePo-FA-B Cmp-C-Zn-Mn-Cu (SE-TAN PLUS) 162-115.2-1 MG CAPS Take 1 tablet by mouth 2 (two) times daily.  3  . folic acid (FOLVITE) 161 MCG tablet Take 400 mcg by mouth every evening.     . furosemide (LASIX) 40 MG tablet Take 1 tablet (40 mg total) by mouth daily. 30 tablet 11  . glimepiride (AMARYL) 4 MG tablet Take 4 mg by mouth daily before breakfast.    . isosorbide mononitrate (IMDUR) 60 MG 24 hr tablet TAKE ONE TABLET BY MOUTH ONCE DAILY 30 tablet 9  . JANUVIA 50 MG tablet Take 50 mg by mouth daily.     . metoprolol tartrate (LOPRESSOR) 25 MG tablet Take 0.5 tablets (12.5 mg total) by mouth 2 (two) times daily. 60 tablet 11  . Multiple Vitamin (MULTIVITAMIN WITH MINERALS) TABS Take 1 tablet by mouth daily.    Marland Kitchen NITROSTAT 0.4 MG SL tablet DISSOLVE ONE TABLET UNDER THE TONGUE EVERY 5 MINUTES AS NEEDED FOR CHEST PAIN.  DO NOT EXCEED A TOTAL OF 3 DOSES IN 15 MINUTES 25 tablet 2  . Omega-3 Fatty Acids 1200 MG CAPS Take 1 capsule by mouth 2 (two) times daily.     . potassium chloride (K-DUR,KLOR-CON) 10 MEQ tablet Take 20 mEq by mouth 2 (two) times daily.     . valsartan (DIOVAN) 80 MG tablet Take 1 tablet (80 mg total) by mouth daily.    Marland Kitchen ZETIA 10 MG tablet TAKE ONE TABLET BY MOUTH ONCE DAILY 90 tablet 3   No current facility-administered medications for this visit.     Allergies:    Darvon [propoxyphene]; Lisinopril; Septra [sulfamethoxazole-trimethoprim]; Warfarin and related; and Penicillins    Social History:  The patient  reports that she has never smoked. She has never used smokeless tobacco. She reports that she does not drink alcohol or use drugs.   Family History:  The patient's family history includes Heart disease in her father and mother.    ROS:  Please see the history of present illness.   Otherwise, review of systems are positive  for Occasional Shortness of breath.   All other systems are reviewed and negative.    PHYSICAL EXAM: VS:  BP (!) 140/56   Pulse 74   Ht 5' (1.524 m)   Wt 138 lb (62.6 kg)   BMI 26.95 kg/m  , BMI Body mass index is 26.95 kg/m. GEN: Well nourished, well developed, in no acute distress  HEENT: normal  Neck: no JVD, carotid bruits, or masses Cardiac: RRR; crisp S2,  1/6 systolic murmur, no rubs, or gallops,no edema  Respiratory:  clear to auscultation bilaterally, normal work of breathing GI: soft, nontender, nondistended, + BS MS: no deformity or atrophy  Skin: warm and dry, no rash Neuro:  Strength and sensation are intact Psych: euthymic mood, full affect     Recent Labs: No results found for requested labs within last 8760 hours.   Lipid Panel    Component Value Date/Time   CHOL 172 05/01/2015 1234   TRIG 248 (H) 05/01/2015 1234   HDL 46 05/01/2015 1234   LDLCALC 76 05/01/2015 1234     Other studies Reviewed: Additional studies/ records that were reviewed today with results demonstrating: Cath results reviewed.   ASSESSMENT AND PLAN:  1. CAD: Angina controlled on medications. Continue exercise. Off Plavix at this point since it's been over a year since her stent. Tolerating aspirin 81 mg daily.   2. AVR: Continue Coumadin to prevent valve thrombosis.  No recent GI bleeding.   3. Hypertension: Blood pressure controlled at other checks. Mildly elevated today. Continue to monitor at  home. 4. Hyperlipidemia: Continue lipid-lowering therapy. Continue healthy diet. She has had issues with mildly elevated triglycerides as well. She is hoping to avoid any additional pills. 5. Stressed importance of avoiding falls, especially since she is on anticoagulation.    Current medicines are reviewed at length with the patient today.  The patient concerns regarding her medicines were addressed.  The following changes have been made:    Labs/ tests ordered today include:  No orders of the defined types were placed in this encounter.   Recommend 150 minutes/week of aerobic exercise Low fat, low carb, high fiber diet recommended  Disposition:   FU in 12 months   Signed, Larae Grooms, MD  10/16/2016 11:08 AM    [facility] [address], [place], [place]  [address] Phone: [phone]; Fax: [phone]

## 2016-10-16 ENCOUNTER — Ambulatory Visit (INDEPENDENT_AMBULATORY_CARE_PROVIDER_SITE_OTHER): Payer: Medicare Other | Admitting: Interventional Cardiology

## 2016-10-16 ENCOUNTER — Ambulatory Visit (INDEPENDENT_AMBULATORY_CARE_PROVIDER_SITE_OTHER): Payer: Medicare Other | Admitting: Pharmacist

## 2016-10-16 ENCOUNTER — Encounter: Payer: Self-pay | Admitting: Interventional Cardiology

## 2016-10-16 VITALS — BP 140/56 | HR 74 | Ht 60.0 in | Wt 138.0 lb

## 2016-10-16 DIAGNOSIS — I2583 Coronary atherosclerosis due to lipid rich plaque: Secondary | ICD-10-CM

## 2016-10-16 DIAGNOSIS — Z952 Presence of prosthetic heart valve: Secondary | ICD-10-CM

## 2016-10-16 DIAGNOSIS — Z5181 Encounter for therapeutic drug level monitoring: Secondary | ICD-10-CM | POA: Diagnosis not present

## 2016-10-16 DIAGNOSIS — I10 Essential (primary) hypertension: Secondary | ICD-10-CM | POA: Diagnosis not present

## 2016-10-16 DIAGNOSIS — Z7901 Long term (current) use of anticoagulants: Secondary | ICD-10-CM | POA: Diagnosis not present

## 2016-10-16 DIAGNOSIS — I359 Nonrheumatic aortic valve disorder, unspecified: Secondary | ICD-10-CM

## 2016-10-16 DIAGNOSIS — I2510 Atherosclerotic heart disease of native coronary artery without angina pectoris: Secondary | ICD-10-CM | POA: Diagnosis not present

## 2016-10-16 LAB — POCT INR: INR: 2.4

## 2016-10-16 NOTE — Patient Instructions (Signed)

## 2016-10-17 NOTE — Addendum Note (Signed)
Addended by: Zebedee Iba on: 10/17/2016 11:36 AM   Modules accepted: Orders

## 2016-11-01 ENCOUNTER — Other Ambulatory Visit: Payer: Self-pay | Admitting: Interventional Cardiology

## 2016-11-04 ENCOUNTER — Ambulatory Visit (INDEPENDENT_AMBULATORY_CARE_PROVIDER_SITE_OTHER): Payer: Medicare Other | Admitting: Pharmacist

## 2016-11-04 DIAGNOSIS — I2583 Coronary atherosclerosis due to lipid rich plaque: Secondary | ICD-10-CM

## 2016-11-04 DIAGNOSIS — Z5181 Encounter for therapeutic drug level monitoring: Secondary | ICD-10-CM

## 2016-11-04 DIAGNOSIS — Z952 Presence of prosthetic heart valve: Secondary | ICD-10-CM | POA: Diagnosis not present

## 2016-11-04 DIAGNOSIS — I359 Nonrheumatic aortic valve disorder, unspecified: Secondary | ICD-10-CM

## 2016-11-04 DIAGNOSIS — I2510 Atherosclerotic heart disease of native coronary artery without angina pectoris: Secondary | ICD-10-CM

## 2016-11-04 LAB — POCT INR: INR: 3.2

## 2016-11-11 DIAGNOSIS — H35423 Microcystoid degeneration of retina, bilateral: Secondary | ICD-10-CM | POA: Diagnosis not present

## 2016-11-11 DIAGNOSIS — H35371 Puckering of macula, right eye: Secondary | ICD-10-CM | POA: Diagnosis not present

## 2016-11-11 DIAGNOSIS — H353112 Nonexudative age-related macular degeneration, right eye, intermediate dry stage: Secondary | ICD-10-CM | POA: Diagnosis not present

## 2016-11-11 DIAGNOSIS — H353221 Exudative age-related macular degeneration, left eye, with active choroidal neovascularization: Secondary | ICD-10-CM | POA: Diagnosis not present

## 2016-11-18 ENCOUNTER — Ambulatory Visit (INDEPENDENT_AMBULATORY_CARE_PROVIDER_SITE_OTHER): Payer: Medicare Other | Admitting: *Deleted

## 2016-11-18 DIAGNOSIS — I2583 Coronary atherosclerosis due to lipid rich plaque: Secondary | ICD-10-CM

## 2016-11-18 DIAGNOSIS — I359 Nonrheumatic aortic valve disorder, unspecified: Secondary | ICD-10-CM

## 2016-11-18 DIAGNOSIS — Z5181 Encounter for therapeutic drug level monitoring: Secondary | ICD-10-CM | POA: Diagnosis not present

## 2016-11-18 DIAGNOSIS — I2510 Atherosclerotic heart disease of native coronary artery without angina pectoris: Secondary | ICD-10-CM

## 2016-11-18 DIAGNOSIS — Z952 Presence of prosthetic heart valve: Secondary | ICD-10-CM

## 2016-11-18 LAB — POCT INR: INR: 2.4

## 2016-11-21 ENCOUNTER — Other Ambulatory Visit: Payer: Self-pay | Admitting: Family Medicine

## 2016-11-21 DIAGNOSIS — D485 Neoplasm of uncertain behavior of skin: Secondary | ICD-10-CM | POA: Diagnosis not present

## 2016-11-21 DIAGNOSIS — L570 Actinic keratosis: Secondary | ICD-10-CM | POA: Diagnosis not present

## 2016-12-09 ENCOUNTER — Ambulatory Visit (INDEPENDENT_AMBULATORY_CARE_PROVIDER_SITE_OTHER): Payer: Medicare Other

## 2016-12-09 DIAGNOSIS — I359 Nonrheumatic aortic valve disorder, unspecified: Secondary | ICD-10-CM

## 2016-12-09 DIAGNOSIS — I2510 Atherosclerotic heart disease of native coronary artery without angina pectoris: Secondary | ICD-10-CM

## 2016-12-09 DIAGNOSIS — Z5181 Encounter for therapeutic drug level monitoring: Secondary | ICD-10-CM

## 2016-12-09 DIAGNOSIS — Z952 Presence of prosthetic heart valve: Secondary | ICD-10-CM

## 2016-12-09 DIAGNOSIS — I2583 Coronary atherosclerosis due to lipid rich plaque: Secondary | ICD-10-CM

## 2016-12-09 LAB — POCT INR: INR: 2.2

## 2016-12-30 ENCOUNTER — Ambulatory Visit (INDEPENDENT_AMBULATORY_CARE_PROVIDER_SITE_OTHER): Payer: Medicare Other | Admitting: *Deleted

## 2016-12-30 DIAGNOSIS — Z5181 Encounter for therapeutic drug level monitoring: Secondary | ICD-10-CM

## 2016-12-30 DIAGNOSIS — I359 Nonrheumatic aortic valve disorder, unspecified: Secondary | ICD-10-CM | POA: Diagnosis not present

## 2016-12-30 DIAGNOSIS — Z952 Presence of prosthetic heart valve: Secondary | ICD-10-CM | POA: Diagnosis not present

## 2016-12-30 DIAGNOSIS — I2510 Atherosclerotic heart disease of native coronary artery without angina pectoris: Secondary | ICD-10-CM

## 2016-12-30 DIAGNOSIS — I2583 Coronary atherosclerosis due to lipid rich plaque: Secondary | ICD-10-CM

## 2016-12-30 LAB — POCT INR: INR: 2

## 2017-01-03 ENCOUNTER — Other Ambulatory Visit: Payer: Self-pay | Admitting: Interventional Cardiology

## 2017-01-07 ENCOUNTER — Ambulatory Visit (HOSPITAL_COMMUNITY)
Admission: RE | Admit: 2017-01-07 | Discharge: 2017-01-07 | Disposition: A | Discharge status: Home / Self Care | Payer: Medicare Other | Source: Ambulatory Visit | Attending: Cardiovascular Disease | Admitting: Cardiovascular Disease

## 2017-01-07 DIAGNOSIS — I6523 Occlusion and stenosis of bilateral carotid arteries: Secondary | ICD-10-CM | POA: Diagnosis not present

## 2017-01-08 ENCOUNTER — Encounter: Payer: Self-pay | Admitting: Physician Assistant

## 2017-01-21 ENCOUNTER — Ambulatory Visit (INDEPENDENT_AMBULATORY_CARE_PROVIDER_SITE_OTHER): Payer: Medicare Other | Admitting: *Deleted

## 2017-01-21 DIAGNOSIS — Z5181 Encounter for therapeutic drug level monitoring: Secondary | ICD-10-CM

## 2017-01-21 DIAGNOSIS — Z952 Presence of prosthetic heart valve: Secondary | ICD-10-CM | POA: Diagnosis not present

## 2017-01-21 DIAGNOSIS — I359 Nonrheumatic aortic valve disorder, unspecified: Secondary | ICD-10-CM

## 2017-01-21 DIAGNOSIS — I6523 Occlusion and stenosis of bilateral carotid arteries: Secondary | ICD-10-CM

## 2017-01-21 LAB — POCT INR: INR: 2.7

## 2017-01-27 DIAGNOSIS — H353112 Nonexudative age-related macular degeneration, right eye, intermediate dry stage: Secondary | ICD-10-CM | POA: Diagnosis not present

## 2017-01-27 DIAGNOSIS — H353221 Exudative age-related macular degeneration, left eye, with active choroidal neovascularization: Secondary | ICD-10-CM | POA: Diagnosis not present

## 2017-01-27 DIAGNOSIS — H35423 Microcystoid degeneration of retina, bilateral: Secondary | ICD-10-CM | POA: Diagnosis not present

## 2017-01-27 DIAGNOSIS — H35432 Paving stone degeneration of retina, left eye: Secondary | ICD-10-CM | POA: Diagnosis not present

## 2017-02-10 ENCOUNTER — Ambulatory Visit (INDEPENDENT_AMBULATORY_CARE_PROVIDER_SITE_OTHER): Payer: Medicare Other

## 2017-02-10 DIAGNOSIS — I359 Nonrheumatic aortic valve disorder, unspecified: Secondary | ICD-10-CM | POA: Diagnosis not present

## 2017-02-10 DIAGNOSIS — Z5181 Encounter for therapeutic drug level monitoring: Secondary | ICD-10-CM

## 2017-02-10 DIAGNOSIS — I6523 Occlusion and stenosis of bilateral carotid arteries: Secondary | ICD-10-CM

## 2017-02-10 DIAGNOSIS — Z952 Presence of prosthetic heart valve: Secondary | ICD-10-CM

## 2017-02-10 LAB — POCT INR: INR: 2.6

## 2017-02-13 ENCOUNTER — Telehealth: Payer: Self-pay | Admitting: Pharmacist

## 2017-02-13 NOTE — Telephone Encounter (Signed)
Pt called clinic stating her dentist started her on clindamycin 150mg  capsules and she is taking 3 a day for 10 days. She took her first dose yesterday. Advised pt that there is no interaction with her warfarin unless she experiences significant GI side effects from the clindamycin which might make her INR increase. She prefers to come in for a 1 week INR check. Appt has been moved up to 5/30.

## 2017-02-18 ENCOUNTER — Ambulatory Visit (INDEPENDENT_AMBULATORY_CARE_PROVIDER_SITE_OTHER): Payer: Medicare Other | Admitting: Pharmacist

## 2017-02-18 DIAGNOSIS — N183 Chronic kidney disease, stage 3 (moderate): Secondary | ICD-10-CM | POA: Diagnosis not present

## 2017-02-18 DIAGNOSIS — Z5181 Encounter for therapeutic drug level monitoring: Secondary | ICD-10-CM

## 2017-02-18 DIAGNOSIS — I6523 Occlusion and stenosis of bilateral carotid arteries: Secondary | ICD-10-CM | POA: Diagnosis not present

## 2017-02-18 DIAGNOSIS — I509 Heart failure, unspecified: Secondary | ICD-10-CM | POA: Diagnosis not present

## 2017-02-18 DIAGNOSIS — D649 Anemia, unspecified: Secondary | ICD-10-CM | POA: Diagnosis not present

## 2017-02-18 DIAGNOSIS — I359 Nonrheumatic aortic valve disorder, unspecified: Secondary | ICD-10-CM | POA: Diagnosis not present

## 2017-02-18 DIAGNOSIS — E7800 Pure hypercholesterolemia, unspecified: Secondary | ICD-10-CM | POA: Diagnosis not present

## 2017-02-18 DIAGNOSIS — Z1389 Encounter for screening for other disorder: Secondary | ICD-10-CM | POA: Diagnosis not present

## 2017-02-18 DIAGNOSIS — E1139 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: Secondary | ICD-10-CM | POA: Diagnosis not present

## 2017-02-18 DIAGNOSIS — Z952 Presence of prosthetic heart valve: Secondary | ICD-10-CM

## 2017-02-18 DIAGNOSIS — I663 Occlusion and stenosis of cerebellar arteries: Secondary | ICD-10-CM | POA: Diagnosis not present

## 2017-02-18 DIAGNOSIS — E1129 Type 2 diabetes mellitus with other diabetic kidney complication: Secondary | ICD-10-CM | POA: Diagnosis not present

## 2017-02-18 DIAGNOSIS — I2510 Atherosclerotic heart disease of native coronary artery without angina pectoris: Secondary | ICD-10-CM | POA: Diagnosis not present

## 2017-02-18 DIAGNOSIS — Z0000 Encounter for general adult medical examination without abnormal findings: Secondary | ICD-10-CM | POA: Diagnosis not present

## 2017-02-18 DIAGNOSIS — R809 Proteinuria, unspecified: Secondary | ICD-10-CM | POA: Diagnosis not present

## 2017-02-18 LAB — POCT INR: INR: 2.1

## 2017-03-06 ENCOUNTER — Ambulatory Visit (INDEPENDENT_AMBULATORY_CARE_PROVIDER_SITE_OTHER): Payer: Medicare Other | Admitting: *Deleted

## 2017-03-06 DIAGNOSIS — Z5181 Encounter for therapeutic drug level monitoring: Secondary | ICD-10-CM

## 2017-03-06 DIAGNOSIS — I6523 Occlusion and stenosis of bilateral carotid arteries: Secondary | ICD-10-CM | POA: Diagnosis not present

## 2017-03-06 DIAGNOSIS — I359 Nonrheumatic aortic valve disorder, unspecified: Secondary | ICD-10-CM

## 2017-03-06 DIAGNOSIS — Z952 Presence of prosthetic heart valve: Secondary | ICD-10-CM | POA: Diagnosis not present

## 2017-03-06 LAB — POCT INR: INR: 2.1

## 2017-03-10 ENCOUNTER — Telehealth: Payer: Self-pay | Admitting: Interventional Cardiology

## 2017-03-10 MED ORDER — EZETIMIBE 10 MG PO TABS: 10.0000 mg | ORAL_TABLET | Freq: Every day | ORAL | 3 refills | Status: DC

## 2017-03-10 NOTE — Telephone Encounter (Signed)
New Message     Pt c/o medication issue:  1. Name of Medication: Zetia 10 mg  2. How are you currently taking this medication (dosage and times per day)?   1day  3. Are you having a reaction (difficulty breathing--STAT)? no  4. What is your medication issue? Insurance will not longer cover the name brand , needs a prescription sent into the [facility] on [address] ,giving the [place] to [place] generic

## 2017-03-10 NOTE — Telephone Encounter (Signed)
Patient made aware that a generic Rx for zetia sent to patient's preferred pharmacy.

## 2017-03-27 ENCOUNTER — Ambulatory Visit (INDEPENDENT_AMBULATORY_CARE_PROVIDER_SITE_OTHER): Payer: Medicare Other | Admitting: *Deleted

## 2017-03-27 DIAGNOSIS — I6523 Occlusion and stenosis of bilateral carotid arteries: Secondary | ICD-10-CM | POA: Diagnosis not present

## 2017-03-27 DIAGNOSIS — I359 Nonrheumatic aortic valve disorder, unspecified: Secondary | ICD-10-CM | POA: Diagnosis not present

## 2017-03-27 DIAGNOSIS — Z5181 Encounter for therapeutic drug level monitoring: Secondary | ICD-10-CM | POA: Diagnosis not present

## 2017-03-27 DIAGNOSIS — Z952 Presence of prosthetic heart valve: Secondary | ICD-10-CM

## 2017-03-27 LAB — POCT INR: INR: 2.1

## 2017-04-03 DIAGNOSIS — H353112 Nonexudative age-related macular degeneration, right eye, intermediate dry stage: Secondary | ICD-10-CM | POA: Diagnosis not present

## 2017-04-03 DIAGNOSIS — H35423 Microcystoid degeneration of retina, bilateral: Secondary | ICD-10-CM | POA: Diagnosis not present

## 2017-04-03 DIAGNOSIS — H353221 Exudative age-related macular degeneration, left eye, with active choroidal neovascularization: Secondary | ICD-10-CM | POA: Diagnosis not present

## 2017-04-03 DIAGNOSIS — H35432 Paving stone degeneration of retina, left eye: Secondary | ICD-10-CM | POA: Diagnosis not present

## 2017-04-17 ENCOUNTER — Ambulatory Visit (INDEPENDENT_AMBULATORY_CARE_PROVIDER_SITE_OTHER): Payer: Medicare Other | Admitting: Pharmacist

## 2017-04-17 DIAGNOSIS — I6523 Occlusion and stenosis of bilateral carotid arteries: Secondary | ICD-10-CM

## 2017-04-17 DIAGNOSIS — Z5181 Encounter for therapeutic drug level monitoring: Secondary | ICD-10-CM | POA: Diagnosis not present

## 2017-04-17 DIAGNOSIS — I359 Nonrheumatic aortic valve disorder, unspecified: Secondary | ICD-10-CM | POA: Diagnosis not present

## 2017-04-17 DIAGNOSIS — Z952 Presence of prosthetic heart valve: Secondary | ICD-10-CM

## 2017-04-17 LAB — POCT INR: INR: 2.1

## 2017-05-05 ENCOUNTER — Ambulatory Visit (INDEPENDENT_AMBULATORY_CARE_PROVIDER_SITE_OTHER): Payer: Medicare Other | Admitting: *Deleted

## 2017-05-05 DIAGNOSIS — Z952 Presence of prosthetic heart valve: Secondary | ICD-10-CM | POA: Diagnosis not present

## 2017-05-05 DIAGNOSIS — Z5181 Encounter for therapeutic drug level monitoring: Secondary | ICD-10-CM | POA: Diagnosis not present

## 2017-05-05 DIAGNOSIS — I6523 Occlusion and stenosis of bilateral carotid arteries: Secondary | ICD-10-CM

## 2017-05-05 DIAGNOSIS — I359 Nonrheumatic aortic valve disorder, unspecified: Secondary | ICD-10-CM

## 2017-05-05 LAB — POCT INR: INR: 2.1

## 2017-05-26 ENCOUNTER — Ambulatory Visit (INDEPENDENT_AMBULATORY_CARE_PROVIDER_SITE_OTHER): Payer: Medicare Other | Admitting: *Deleted

## 2017-05-26 DIAGNOSIS — I359 Nonrheumatic aortic valve disorder, unspecified: Secondary | ICD-10-CM | POA: Diagnosis not present

## 2017-05-26 DIAGNOSIS — Z952 Presence of prosthetic heart valve: Secondary | ICD-10-CM | POA: Diagnosis not present

## 2017-05-26 DIAGNOSIS — Z5181 Encounter for therapeutic drug level monitoring: Secondary | ICD-10-CM

## 2017-05-26 LAB — POCT INR: INR: 2.5

## 2017-06-10 DIAGNOSIS — Z23 Encounter for immunization: Secondary | ICD-10-CM | POA: Diagnosis not present

## 2017-06-13 ENCOUNTER — Other Ambulatory Visit: Payer: Self-pay | Admitting: Interventional Cardiology

## 2017-06-16 ENCOUNTER — Ambulatory Visit (INDEPENDENT_AMBULATORY_CARE_PROVIDER_SITE_OTHER): Payer: Medicare Other | Admitting: *Deleted

## 2017-06-16 DIAGNOSIS — Z952 Presence of prosthetic heart valve: Secondary | ICD-10-CM

## 2017-06-16 DIAGNOSIS — I359 Nonrheumatic aortic valve disorder, unspecified: Secondary | ICD-10-CM | POA: Diagnosis not present

## 2017-06-16 DIAGNOSIS — H353221 Exudative age-related macular degeneration, left eye, with active choroidal neovascularization: Secondary | ICD-10-CM | POA: Diagnosis not present

## 2017-06-16 DIAGNOSIS — E113293 Type 2 diabetes mellitus with mild nonproliferative diabetic retinopathy without macular edema, bilateral: Secondary | ICD-10-CM | POA: Diagnosis not present

## 2017-06-16 DIAGNOSIS — Z5181 Encounter for therapeutic drug level monitoring: Secondary | ICD-10-CM

## 2017-06-16 DIAGNOSIS — H353112 Nonexudative age-related macular degeneration, right eye, intermediate dry stage: Secondary | ICD-10-CM | POA: Diagnosis not present

## 2017-06-16 DIAGNOSIS — H35423 Microcystoid degeneration of retina, bilateral: Secondary | ICD-10-CM | POA: Diagnosis not present

## 2017-06-16 LAB — POCT INR: INR: 1.8

## 2017-06-27 ENCOUNTER — Other Ambulatory Visit: Payer: Self-pay | Admitting: Interventional Cardiology

## 2017-07-07 ENCOUNTER — Ambulatory Visit (INDEPENDENT_AMBULATORY_CARE_PROVIDER_SITE_OTHER): Payer: Medicare Other | Admitting: *Deleted

## 2017-07-07 DIAGNOSIS — Z952 Presence of prosthetic heart valve: Secondary | ICD-10-CM | POA: Diagnosis not present

## 2017-07-07 DIAGNOSIS — Z5181 Encounter for therapeutic drug level monitoring: Secondary | ICD-10-CM | POA: Diagnosis not present

## 2017-07-07 DIAGNOSIS — I359 Nonrheumatic aortic valve disorder, unspecified: Secondary | ICD-10-CM | POA: Diagnosis not present

## 2017-07-07 LAB — POCT INR: INR: 1.9

## 2017-07-14 DIAGNOSIS — H26493 Other secondary cataract, bilateral: Secondary | ICD-10-CM | POA: Diagnosis not present

## 2017-07-14 DIAGNOSIS — Z961 Presence of intraocular lens: Secondary | ICD-10-CM | POA: Diagnosis not present

## 2017-07-14 DIAGNOSIS — H02839 Dermatochalasis of unspecified eye, unspecified eyelid: Secondary | ICD-10-CM | POA: Diagnosis not present

## 2017-07-14 DIAGNOSIS — I10 Essential (primary) hypertension: Secondary | ICD-10-CM | POA: Diagnosis not present

## 2017-07-14 DIAGNOSIS — H26491 Other secondary cataract, right eye: Secondary | ICD-10-CM | POA: Diagnosis not present

## 2017-07-16 ENCOUNTER — Other Ambulatory Visit: Payer: Self-pay | Admitting: *Deleted

## 2017-07-16 DIAGNOSIS — I6523 Occlusion and stenosis of bilateral carotid arteries: Secondary | ICD-10-CM

## 2017-07-21 ENCOUNTER — Ambulatory Visit (INDEPENDENT_AMBULATORY_CARE_PROVIDER_SITE_OTHER): Payer: Medicare Other

## 2017-07-21 DIAGNOSIS — Z5181 Encounter for therapeutic drug level monitoring: Secondary | ICD-10-CM | POA: Diagnosis not present

## 2017-07-21 DIAGNOSIS — Z952 Presence of prosthetic heart valve: Secondary | ICD-10-CM | POA: Diagnosis not present

## 2017-07-21 DIAGNOSIS — I359 Nonrheumatic aortic valve disorder, unspecified: Secondary | ICD-10-CM

## 2017-07-21 LAB — POCT INR: INR: 1.8

## 2017-08-04 ENCOUNTER — Ambulatory Visit (INDEPENDENT_AMBULATORY_CARE_PROVIDER_SITE_OTHER): Payer: Medicare Other | Admitting: *Deleted

## 2017-08-04 DIAGNOSIS — Z952 Presence of prosthetic heart valve: Secondary | ICD-10-CM

## 2017-08-04 DIAGNOSIS — Z5181 Encounter for therapeutic drug level monitoring: Secondary | ICD-10-CM | POA: Diagnosis not present

## 2017-08-04 DIAGNOSIS — H26492 Other secondary cataract, left eye: Secondary | ICD-10-CM | POA: Diagnosis not present

## 2017-08-04 DIAGNOSIS — I359 Nonrheumatic aortic valve disorder, unspecified: Secondary | ICD-10-CM | POA: Diagnosis not present

## 2017-08-04 DIAGNOSIS — H25042 Posterior subcapsular polar age-related cataract, left eye: Secondary | ICD-10-CM | POA: Diagnosis not present

## 2017-08-04 LAB — POCT INR: INR: 2.6

## 2017-08-04 NOTE — Patient Instructions (Signed)
Today take 1/2 tablet then resume same dosage 1 tablet every day except 1/2 tablet on Wednesdays.  Recheck INR in 2 weeks. Call our office if you have any problems or concerns [phone].

## 2017-08-18 ENCOUNTER — Ambulatory Visit (INDEPENDENT_AMBULATORY_CARE_PROVIDER_SITE_OTHER): Payer: Medicare Other

## 2017-08-18 DIAGNOSIS — Z5181 Encounter for therapeutic drug level monitoring: Secondary | ICD-10-CM | POA: Diagnosis not present

## 2017-08-18 DIAGNOSIS — Z952 Presence of prosthetic heart valve: Secondary | ICD-10-CM | POA: Diagnosis not present

## 2017-08-18 DIAGNOSIS — I359 Nonrheumatic aortic valve disorder, unspecified: Secondary | ICD-10-CM | POA: Diagnosis not present

## 2017-08-18 LAB — POCT INR: INR: 2.2

## 2017-08-18 NOTE — Patient Instructions (Signed)
Continue on same dosage 1 tablet every day except 1/2 tablet on Wednesdays.  Recheck INR in 3 weeks. Call our office if you have any problems or concerns [phone].

## 2017-08-24 DIAGNOSIS — J029 Acute pharyngitis, unspecified: Secondary | ICD-10-CM | POA: Diagnosis not present

## 2017-08-25 DIAGNOSIS — H353112 Nonexudative age-related macular degeneration, right eye, intermediate dry stage: Secondary | ICD-10-CM | POA: Diagnosis not present

## 2017-08-25 DIAGNOSIS — E113293 Type 2 diabetes mellitus with mild nonproliferative diabetic retinopathy without macular edema, bilateral: Secondary | ICD-10-CM | POA: Diagnosis not present

## 2017-08-25 DIAGNOSIS — H35371 Puckering of macula, right eye: Secondary | ICD-10-CM | POA: Diagnosis not present

## 2017-08-25 DIAGNOSIS — H353221 Exudative age-related macular degeneration, left eye, with active choroidal neovascularization: Secondary | ICD-10-CM | POA: Diagnosis not present

## 2017-09-05 ENCOUNTER — Other Ambulatory Visit: Payer: Self-pay | Admitting: Interventional Cardiology

## 2017-09-08 ENCOUNTER — Ambulatory Visit (INDEPENDENT_AMBULATORY_CARE_PROVIDER_SITE_OTHER): Payer: Medicare Other | Admitting: *Deleted

## 2017-09-08 DIAGNOSIS — Z5181 Encounter for therapeutic drug level monitoring: Secondary | ICD-10-CM

## 2017-09-08 DIAGNOSIS — Z952 Presence of prosthetic heart valve: Secondary | ICD-10-CM

## 2017-09-08 DIAGNOSIS — I480 Paroxysmal atrial fibrillation: Secondary | ICD-10-CM

## 2017-09-08 DIAGNOSIS — I359 Nonrheumatic aortic valve disorder, unspecified: Secondary | ICD-10-CM

## 2017-09-08 LAB — POCT INR: INR: 1.9

## 2017-09-08 NOTE — Patient Instructions (Signed)
Description   Today Dec 18th take 1 and 1/2 tablets (7.5mg ) then continue on same dosage 1 tablet every day except 1/2 tablet on Wednesdays.  Recheck INR in 3 weeks. Call our office if you have any problems or concerns [phone].

## 2017-09-26 ENCOUNTER — Other Ambulatory Visit: Payer: Self-pay | Admitting: Interventional Cardiology

## 2017-09-29 ENCOUNTER — Ambulatory Visit (INDEPENDENT_AMBULATORY_CARE_PROVIDER_SITE_OTHER): Payer: Medicare Other | Admitting: *Deleted

## 2017-09-29 DIAGNOSIS — Z5181 Encounter for therapeutic drug level monitoring: Secondary | ICD-10-CM

## 2017-09-29 DIAGNOSIS — I359 Nonrheumatic aortic valve disorder, unspecified: Secondary | ICD-10-CM

## 2017-09-29 DIAGNOSIS — Z952 Presence of prosthetic heart valve: Secondary | ICD-10-CM

## 2017-09-29 LAB — POCT INR: INR: 2.3

## 2017-09-29 NOTE — Patient Instructions (Addendum)
Description   Continue on same dosage 1 tablet every day except 1/2 tablet on Wednesdays.  Pt prefers 3 weeks.  Recheck INR in 3 weeks. Call our office if you have any problems or concerns [phone].       

## 2017-10-10 ENCOUNTER — Other Ambulatory Visit: Payer: Self-pay | Admitting: Interventional Cardiology

## 2017-10-20 ENCOUNTER — Ambulatory Visit (INDEPENDENT_AMBULATORY_CARE_PROVIDER_SITE_OTHER): Payer: Medicare Other | Admitting: *Deleted

## 2017-10-20 DIAGNOSIS — Z952 Presence of prosthetic heart valve: Secondary | ICD-10-CM | POA: Diagnosis not present

## 2017-10-20 DIAGNOSIS — I359 Nonrheumatic aortic valve disorder, unspecified: Secondary | ICD-10-CM

## 2017-10-20 DIAGNOSIS — Z5181 Encounter for therapeutic drug level monitoring: Secondary | ICD-10-CM | POA: Diagnosis not present

## 2017-10-20 LAB — POCT INR: INR: 1.9

## 2017-10-20 NOTE — Patient Instructions (Signed)
Description   Tomorrow take 1 tablet, then Continue on same dosage 1 tablet every day except 1/2 tablet on Wednesdays.  Pt prefers 3 weeks.  Recheck INR in 3 weeks. Call our office if you have any problems or concerns [phone].

## 2017-10-24 ENCOUNTER — Other Ambulatory Visit: Payer: Self-pay | Admitting: Interventional Cardiology

## 2017-10-28 ENCOUNTER — Encounter: Payer: Self-pay | Admitting: Interventional Cardiology

## 2017-11-03 NOTE — Progress Notes (Signed)
Cardiology Office Note   Date:  11/04/2017   ID:  [name], [name] [birth year], MRN [id number]  PCP:  Gaynelle Arabian, MD    No chief complaint on file. CAD   Wt Readings from Last 3 Encounters:  11/04/17 140 lb 1.9 oz (63.6 kg)  10/16/16 138 lb (62.6 kg)  04/17/16 140 lb (63.5 kg)       History of Present Illness: [name] is a 84 y.o. female  with a hx of CAD and aortic valve disease status post CABG plus mechanical AVR, subsequent PCI to the RCA with BMS, HTN, HL, diabetes, prior GI bleed, CKD, diastolic CHF, PAF.   She was evaluated in 7/16 for anginal symptoms. LHC demonstrated severe native three-vessel disease. LIMA-LAD was patent, SVG-diagonal patent, SVG-OM/PDA occluded. The second half of the OM/PDA graft was open. RCA stents were patent. She had an ostial RCA lesion of 75% which was treated with a Synergy DES. Because she needs Coumadin for her mechanical AVR, she was continued on Coumadin plus Plavix only (no aspirin).  She had some melena after the procedure.   She has done well since the last visit.  She did have some chest discomfort 20 minutes into her last workout.  She stopped and then restarted and completed her workout after waiting for a few minutes. Sx did not approach what she had before her last stent.    She feels her balance is getting worse.  No recent falls.     Past Medical History:  Diagnosis Date  . Anemia   . Anginal pain ([place])   . Arteriovenous malformation of gastrointestinal tract   . Arthritis    "fingers mostly" (04/12/2015)  . Carotid artery disease ([place])    Carotid US 3/17:  93-73% RICA; 42-87% LICA; Elevated bilateral subclavian artery velocities >>f/u 1 year. // Carotid US 4/18: R 40-59; L 1-39 >> FU 1 year  . CHF (congestive heart failure) ([place])   . Chronic kidney disease (CKD), stage III (moderate) ([facility])   . Chronic lower GI bleeding    "today; last time was ~ 8 yr ago; used to have them often before that too"  (02/11/2013)  . Coronary artery disease   . DJD (degenerative joint disease)   . Heart murmur   . History of blood transfusion    "a few times over the years; usually related to my Coumadin" (04/12/2015  . History of gout   . Hyperlipemia   . Hypertension   . Macula lutea degeneration   . Old MI (myocardial infarction)    "a coulple /dr in 02/2008; I never even knew I'd had them" (04/12/2015)  . Type II diabetes mellitus ([place])     Past Surgical History:  Procedure Laterality Date  . AORTIC VALVE REPLACEMENT  1993  . APPENDECTOMY  1953  . CARDIAC CATHETERIZATION  ~1990  . CARDIAC CATHETERIZATION  04/12/2015   Procedure: Coronary Stent Intervention;  Surgeon: Jettie Booze, MD;    SYNERGY DES 3.5X16 to the ostial RCA   . CARDIAC CATHETERIZATION  04/12/2015   Procedure: Coronary/Graft Angiography;  Surgeon: Eloy End, MD; LAD & CFX 100%, patent LIMA-LAD, SVG-D1; SVG-OM-PDA first limb 100%, 2nd limb patent; oRCA 75%>0 w/ stent  . [place]   St. Jude/notes 10/29/2003 (02/11/2013)  . COLONOSCOPY N/A 02/13/2013   Procedure: COLONOSCOPY;  Surgeon: Lafayette Dragon, MD;  Location: [facility] ENDOSCOPY;  Service: Endoscopy;  Laterality: N/A;  . CORONARY ANGIOPLASTY WITH  STENT PLACEMENT  2009+   "3 at least; put in 1 stent at a time" (02/11/2013)  . CORONARY ARTERY BYPASS GRAFT  1990   LIMA-LAD, SVG-OM-PDA, SVG-D1  . DILATION AND CURETTAGE OF UTERUS  1960's   'after a miscarriage" (02/11/2013)  . ENTEROSCOPY N/A 02/13/2013   Procedure: ENTEROSCOPY;  Surgeon: Lafayette Dragon, MD;  Location: [facility] ENDOSCOPY;  Service: Endoscopy;  Laterality: N/A;  . EXPLORATORY LAPAROTOMY  02/24/2008   which revealed a retroperitoneal hematoma and bleeding from the right external iliac artery/notes 03/02/2008 (02/11/2013)   . FLEXIBLE SIGMOIDOSCOPY N/A 04/21/2015   Procedure: FLEXIBLE SIGMOIDOSCOPY;  Surgeon: Ladene Artist, MD;  Location: [facility] ENDOSCOPY;  Service: Endoscopy;  Laterality: N/A;      Current Outpatient Medications  Medication Sig Dispense Refill  . aspirin EC 81 MG tablet Take 1 tablet (81 mg total) by mouth daily.    Marland Kitchen atorvastatin (LIPITOR) 80 MG tablet Take 1 tablet (80 mg total) by mouth daily. Please keep upcoming appt for future refills. Thank you 30 tablet 0  . bevacizumab (AVASTIN) 1.25 mg/0.1 mL SOLN Place 1.25 mg into the left eye every 8 (eight) weeks.    . Canagliflozin (INVOKANA) 100 MG TABS Take 100 mg by mouth daily.     Marland Kitchen COUMADIN 5 MG tablet USE AS DIRECTED BY  COUMADIN  CLINIC 40 tablet 3  . digoxin (LANOXIN) 0.125 MG tablet Take 0.125 mg by mouth daily.    Marland Kitchen FeFum-FePo-FA-B Cmp-C-Zn-Mn-Cu (SE-TAN PLUS) 162-115.2-1 MG CAPS Take 1 tablet by mouth 2 (two) times daily.  3  . folic acid (FOLVITE) 527 MCG tablet Take 400 mcg by mouth every evening.     . furosemide (LASIX) 40 MG tablet Take 1 tablet (40 mg total) by mouth daily. 30 tablet 11  . glimepiride (AMARYL) 4 MG tablet Take 4 mg by mouth daily before breakfast.    . isosorbide mononitrate (IMDUR) 60 MG 24 hr tablet TAKE 1 TABLET BY MOUTH ONCE DAILY 90 tablet 0  . JANUVIA 50 MG tablet Take 50 mg by mouth daily.     Marland Kitchen losartan (COZAAR) 50 MG tablet Take 50 mg by mouth daily.    . metoprolol tartrate (LOPRESSOR) 25 MG tablet Take 0.5 tablets (12.5 mg total) by mouth 2 (two) times daily. 60 tablet 11  . Multiple Vitamin (MULTIVITAMIN WITH MINERALS) TABS Take 1 tablet by mouth daily.    . nitroGLYCERIN (NITROSTAT) 0.4 MG SL tablet Place 1 tablet (0.4 mg total) under the tongue every 5 (five) minutes as needed for chest pain. Please make appt for January. 1st attempt 25 tablet 0  . Omega-3 Fatty Acids 1200 MG CAPS Take 1 capsule by mouth 2 (two) times daily.     . potassium chloride (K-DUR,KLOR-CON) 10 MEQ tablet Take 20 mEq by mouth 2 (two) times daily.     Marland Kitchen ezetimibe (ZETIA) 10 MG tablet Take 1 tablet (10 mg total) by mouth daily. 90 tablet 3   No current facility-administered medications for this  visit.     Allergies:   Darvon [propoxyphene]; Lisinopril; Septra [sulfamethoxazole-trimethoprim]; Warfarin and related; and Penicillins    Social History:  The patient  reports that  has never smoked. she has never used smokeless tobacco. She reports that she does not drink alcohol or use drugs.   Family History:  The patient's \ family history includes Heart disease in her father and mother.    ROS:  Please see the history of present illness.   Otherwise, review  of systems are positive for imbalance.   All other systems are reviewed and negative.    PHYSICAL EXAM: VS:  BP 126/62   Pulse 63   Ht 5' (1.524 m)   Wt 140 lb 1.9 oz (63.6 kg)   SpO2 95%   BMI 27.37 kg/m  , BMI Body mass index is 27.37 kg/m. GEN: Well nourished, well developed, in no acute distress  HEENT: normal  Neck: no JVD, carotid bruits, or masses Cardiac: RRR; 2/6 systolic murmur, no rubs, or gallops,tr edema ; Crisp S2 cick Respiratory:  clear to auscultation bilaterally, normal work of breathing GI: soft, nontender, nondistended, + BS MS: no deformity or atrophy  Skin: warm and dry, no rash Neuro:  Strength and sensation are intact Psych: euthymic mood, full affect   EKG:   The ekg ordered today demonstrates NSR, IVCD, no significant ST changes   Recent Labs: No results found for requested labs within last 8760 hours.   Lipid Panel    Component Value Date/Time   CHOL 172 05/01/2015 1234   TRIG 248 (H) 05/01/2015 1234   HDL 46 05/01/2015 1234   LDLCALC 76 05/01/2015 1234     Other studies Reviewed: Additional studies/ records that were reviewed today with results demonstrating: cath results and labs reviewed.   ASSESSMENT AND PLAN:  1. CAD: Angina controlled mostly.  COntinue to watch for worsening sx. Let us know if there is a change.  2. AVR: AVR functioning well.  Coumadin to prevent thrombosis. 3. Hyperlipidemia: Lipids well controlled. Continue atorvastatin.  4. Anticoagulated: No  bleeding problems.   5. HTN: BPO controlled. COntinue current meds.    Current medicines are reviewed at length with the patient today.  The patient concerns regarding her medicines were addressed.  The following changes have been made:  No change  Labs/ tests ordered today include:  No orders of the defined types were placed in this encounter.   Recommend 150 minutes/week of aerobic exercise Low fat, low carb, high fiber diet recommended  Disposition:   FU in 1 year   Signed, Larae Grooms, MD  11/04/2017 11:40 AM    [facility] [place], [place], [place]  [address] Phone: [phone]; Fax: [phone]

## 2017-11-04 ENCOUNTER — Encounter: Payer: Self-pay | Admitting: Interventional Cardiology

## 2017-11-04 ENCOUNTER — Ambulatory Visit (INDEPENDENT_AMBULATORY_CARE_PROVIDER_SITE_OTHER): Payer: Medicare Other | Admitting: Interventional Cardiology

## 2017-11-04 VITALS — BP 126/62 | HR 63 | Ht 60.0 in | Wt 140.1 lb

## 2017-11-04 DIAGNOSIS — I2583 Coronary atherosclerosis due to lipid rich plaque: Secondary | ICD-10-CM | POA: Diagnosis not present

## 2017-11-04 DIAGNOSIS — I10 Essential (primary) hypertension: Secondary | ICD-10-CM | POA: Diagnosis not present

## 2017-11-04 DIAGNOSIS — Z952 Presence of prosthetic heart valve: Secondary | ICD-10-CM

## 2017-11-04 DIAGNOSIS — E782 Mixed hyperlipidemia: Secondary | ICD-10-CM

## 2017-11-04 DIAGNOSIS — I2510 Atherosclerotic heart disease of native coronary artery without angina pectoris: Secondary | ICD-10-CM

## 2017-11-04 NOTE — Patient Instructions (Signed)

## 2017-11-10 ENCOUNTER — Ambulatory Visit (INDEPENDENT_AMBULATORY_CARE_PROVIDER_SITE_OTHER): Payer: Medicare Other | Admitting: *Deleted

## 2017-11-10 DIAGNOSIS — Z5181 Encounter for therapeutic drug level monitoring: Secondary | ICD-10-CM

## 2017-11-10 DIAGNOSIS — Z952 Presence of prosthetic heart valve: Secondary | ICD-10-CM

## 2017-11-10 DIAGNOSIS — I359 Nonrheumatic aortic valve disorder, unspecified: Secondary | ICD-10-CM | POA: Diagnosis not present

## 2017-11-10 LAB — POCT INR: INR: 2.1

## 2017-11-10 NOTE — Patient Instructions (Signed)
Description   Continue on same dosage 1 tablet every day except 1/2 tablet on Wednesdays.  Pt prefers 3 weeks.  Recheck INR in 3 weeks. Call our office if you have any problems or concerns [phone].       

## 2017-11-17 ENCOUNTER — Other Ambulatory Visit: Payer: Self-pay | Admitting: Interventional Cardiology

## 2017-11-17 DIAGNOSIS — E113293 Type 2 diabetes mellitus with mild nonproliferative diabetic retinopathy without macular edema, bilateral: Secondary | ICD-10-CM | POA: Diagnosis not present

## 2017-11-17 DIAGNOSIS — H35371 Puckering of macula, right eye: Secondary | ICD-10-CM | POA: Diagnosis not present

## 2017-11-17 DIAGNOSIS — H353221 Exudative age-related macular degeneration, left eye, with active choroidal neovascularization: Secondary | ICD-10-CM | POA: Diagnosis not present

## 2017-11-17 DIAGNOSIS — H353112 Nonexudative age-related macular degeneration, right eye, intermediate dry stage: Secondary | ICD-10-CM | POA: Diagnosis not present

## 2017-11-17 MED ORDER — ATORVASTATIN CALCIUM 80 MG PO TABS: 80.0000 mg | ORAL_TABLET | Freq: Every day | ORAL | 3 refills | Status: DC

## 2017-12-01 ENCOUNTER — Ambulatory Visit (INDEPENDENT_AMBULATORY_CARE_PROVIDER_SITE_OTHER): Payer: Medicare Other | Admitting: *Deleted

## 2017-12-01 DIAGNOSIS — Z952 Presence of prosthetic heart valve: Secondary | ICD-10-CM

## 2017-12-01 DIAGNOSIS — Z5181 Encounter for therapeutic drug level monitoring: Secondary | ICD-10-CM | POA: Diagnosis not present

## 2017-12-01 DIAGNOSIS — I359 Nonrheumatic aortic valve disorder, unspecified: Secondary | ICD-10-CM | POA: Diagnosis not present

## 2017-12-01 LAB — POCT INR: INR: 1.9

## 2017-12-01 NOTE — Patient Instructions (Signed)
Description   Tomorrow take 1 tablet then continue on same dosage 1 tablet every day except 1/2 tablet on Wednesdays.  Pt prefers 3 weeks.  Recheck INR in 3 weeks. Call our office if you have any problems or concerns [phone].

## 2017-12-19 ENCOUNTER — Other Ambulatory Visit: Payer: Self-pay | Admitting: Interventional Cardiology

## 2017-12-22 ENCOUNTER — Ambulatory Visit (INDEPENDENT_AMBULATORY_CARE_PROVIDER_SITE_OTHER): Payer: Medicare Other | Admitting: *Deleted

## 2017-12-22 DIAGNOSIS — Z5181 Encounter for therapeutic drug level monitoring: Secondary | ICD-10-CM

## 2017-12-22 DIAGNOSIS — Z952 Presence of prosthetic heart valve: Secondary | ICD-10-CM

## 2017-12-22 DIAGNOSIS — I359 Nonrheumatic aortic valve disorder, unspecified: Secondary | ICD-10-CM

## 2017-12-22 LAB — POCT INR: INR: 2

## 2017-12-22 NOTE — Patient Instructions (Signed)
Description   Continue on same dosage 1 tablet every day except 1/2 tablet on Wednesdays.  Pt prefers 3 weeks.  Recheck INR in 3 weeks. Call our office if you have any problems or concerns [phone].       

## 2018-01-12 ENCOUNTER — Ambulatory Visit (INDEPENDENT_AMBULATORY_CARE_PROVIDER_SITE_OTHER): Payer: Medicare Other | Admitting: *Deleted

## 2018-01-12 DIAGNOSIS — I359 Nonrheumatic aortic valve disorder, unspecified: Secondary | ICD-10-CM | POA: Diagnosis not present

## 2018-01-12 DIAGNOSIS — Z952 Presence of prosthetic heart valve: Secondary | ICD-10-CM | POA: Diagnosis not present

## 2018-01-12 DIAGNOSIS — I480 Paroxysmal atrial fibrillation: Secondary | ICD-10-CM | POA: Diagnosis not present

## 2018-01-12 DIAGNOSIS — Z5181 Encounter for therapeutic drug level monitoring: Secondary | ICD-10-CM | POA: Diagnosis not present

## 2018-01-12 LAB — POCT INR: INR: 2.4

## 2018-01-12 NOTE — Patient Instructions (Signed)
Description   Continue on same dosage 1 tablet every day except 1/2 tablet on Wednesdays.  Pt prefers 3 weeks.  Recheck INR in 3 weeks. Call our office if you have any problems or concerns [phone].       

## 2018-01-23 ENCOUNTER — Other Ambulatory Visit: Payer: Self-pay | Admitting: Interventional Cardiology

## 2018-02-02 ENCOUNTER — Ambulatory Visit (INDEPENDENT_AMBULATORY_CARE_PROVIDER_SITE_OTHER): Payer: Medicare Other | Admitting: *Deleted

## 2018-02-02 DIAGNOSIS — I359 Nonrheumatic aortic valve disorder, unspecified: Secondary | ICD-10-CM | POA: Diagnosis not present

## 2018-02-02 DIAGNOSIS — I480 Paroxysmal atrial fibrillation: Secondary | ICD-10-CM | POA: Diagnosis not present

## 2018-02-02 DIAGNOSIS — Z952 Presence of prosthetic heart valve: Secondary | ICD-10-CM | POA: Diagnosis not present

## 2018-02-02 DIAGNOSIS — Z5181 Encounter for therapeutic drug level monitoring: Secondary | ICD-10-CM | POA: Diagnosis not present

## 2018-02-02 LAB — POCT INR: INR: 2

## 2018-02-02 NOTE — Patient Instructions (Signed)
Description   Continue on same dosage 1 tablet every day except 1/2 tablet on Wednesdays.  Pt prefers 3 weeks.  Recheck INR in 3 weeks. Call our office if you have any problems or concerns [phone].       

## 2018-02-13 ENCOUNTER — Other Ambulatory Visit: Payer: Self-pay | Admitting: Interventional Cardiology

## 2018-02-19 DIAGNOSIS — H35371 Puckering of macula, right eye: Secondary | ICD-10-CM | POA: Diagnosis not present

## 2018-02-19 DIAGNOSIS — H353221 Exudative age-related macular degeneration, left eye, with active choroidal neovascularization: Secondary | ICD-10-CM | POA: Diagnosis not present

## 2018-02-19 DIAGNOSIS — E113293 Type 2 diabetes mellitus with mild nonproliferative diabetic retinopathy without macular edema, bilateral: Secondary | ICD-10-CM | POA: Diagnosis not present

## 2018-02-19 DIAGNOSIS — H353112 Nonexudative age-related macular degeneration, right eye, intermediate dry stage: Secondary | ICD-10-CM | POA: Diagnosis not present

## 2018-02-26 ENCOUNTER — Ambulatory Visit (INDEPENDENT_AMBULATORY_CARE_PROVIDER_SITE_OTHER): Payer: Medicare Other | Admitting: *Deleted

## 2018-02-26 DIAGNOSIS — Z952 Presence of prosthetic heart valve: Secondary | ICD-10-CM | POA: Diagnosis not present

## 2018-02-26 DIAGNOSIS — Z5181 Encounter for therapeutic drug level monitoring: Secondary | ICD-10-CM | POA: Diagnosis not present

## 2018-02-26 DIAGNOSIS — I359 Nonrheumatic aortic valve disorder, unspecified: Secondary | ICD-10-CM

## 2018-02-26 LAB — POCT INR: INR: 1.6 — AB (ref 2.0–3.0)

## 2018-02-26 NOTE — Patient Instructions (Signed)
Description   Today take 1.5 tablets then continue on same dosage 1 tablet every day except 1/2 tablet on Wednesdays.  Pt prefers 3 weeks.  Recheck INR in 2 weeks. Call our office if you have any problems or concerns [phone].       

## 2018-03-12 ENCOUNTER — Ambulatory Visit (INDEPENDENT_AMBULATORY_CARE_PROVIDER_SITE_OTHER): Payer: Medicare Other | Admitting: *Deleted

## 2018-03-12 DIAGNOSIS — Z952 Presence of prosthetic heart valve: Secondary | ICD-10-CM

## 2018-03-12 DIAGNOSIS — I359 Nonrheumatic aortic valve disorder, unspecified: Secondary | ICD-10-CM

## 2018-03-12 DIAGNOSIS — Z5181 Encounter for therapeutic drug level monitoring: Secondary | ICD-10-CM | POA: Diagnosis not present

## 2018-03-12 LAB — POCT INR: INR: 2 (ref 2.0–3.0)

## 2018-03-12 NOTE — Patient Instructions (Signed)
Description   Continue on same dosage 1 tablet every day except 1/2 tablet on Wednesdays.  Pt prefers 3 weeks.  Recheck INR in 3 weeks. Call our office if you have any problems or concerns [phone].       

## 2018-04-02 ENCOUNTER — Ambulatory Visit (INDEPENDENT_AMBULATORY_CARE_PROVIDER_SITE_OTHER): Payer: Medicare Other | Admitting: *Deleted

## 2018-04-02 DIAGNOSIS — I359 Nonrheumatic aortic valve disorder, unspecified: Secondary | ICD-10-CM

## 2018-04-02 DIAGNOSIS — Z5181 Encounter for therapeutic drug level monitoring: Secondary | ICD-10-CM

## 2018-04-02 DIAGNOSIS — Z952 Presence of prosthetic heart valve: Secondary | ICD-10-CM | POA: Diagnosis not present

## 2018-04-02 LAB — POCT INR: INR: 1.8 — AB (ref 2.0–3.0)

## 2018-04-02 NOTE — Patient Instructions (Signed)
Description   Today take 1.5 tablets then continue on same dosage 1 tablet every day except 1/2 tablet on Wednesdays.  Pt prefers 3 weeks.  Recheck INR in 2 weeks. Call our office if you have any problems or concerns [phone].

## 2018-04-16 ENCOUNTER — Ambulatory Visit (INDEPENDENT_AMBULATORY_CARE_PROVIDER_SITE_OTHER): Payer: Medicare Other | Admitting: Pharmacist

## 2018-04-16 DIAGNOSIS — I359 Nonrheumatic aortic valve disorder, unspecified: Secondary | ICD-10-CM | POA: Diagnosis not present

## 2018-04-16 DIAGNOSIS — Z952 Presence of prosthetic heart valve: Secondary | ICD-10-CM | POA: Diagnosis not present

## 2018-04-16 DIAGNOSIS — Z5181 Encounter for therapeutic drug level monitoring: Secondary | ICD-10-CM | POA: Diagnosis not present

## 2018-04-16 LAB — POCT INR: INR: 2.2 (ref 2.0–3.0)

## 2018-04-16 NOTE — Patient Instructions (Signed)
Description   Continue on same dosage 1 tablet every day except 1/2 tablet on Wednesdays.  Pt prefers 3 weeks.  Recheck INR in 3 weeks. Call our office if you have any problems or concerns [phone].

## 2018-04-23 ENCOUNTER — Other Ambulatory Visit: Payer: Self-pay | Admitting: Family Medicine

## 2018-04-23 DIAGNOSIS — N183 Chronic kidney disease, stage 3 (moderate): Secondary | ICD-10-CM | POA: Diagnosis not present

## 2018-04-23 DIAGNOSIS — I2510 Atherosclerotic heart disease of native coronary artery without angina pectoris: Secondary | ICD-10-CM | POA: Diagnosis not present

## 2018-04-23 DIAGNOSIS — Z1231 Encounter for screening mammogram for malignant neoplasm of breast: Secondary | ICD-10-CM | POA: Diagnosis not present

## 2018-04-23 DIAGNOSIS — I509 Heart failure, unspecified: Secondary | ICD-10-CM | POA: Diagnosis not present

## 2018-04-23 DIAGNOSIS — I130 Hypertensive heart and chronic kidney disease with heart failure and stage 1 through stage 4 chronic kidney disease, or unspecified chronic kidney disease: Secondary | ICD-10-CM | POA: Diagnosis not present

## 2018-04-23 DIAGNOSIS — E7800 Pure hypercholesterolemia, unspecified: Secondary | ICD-10-CM | POA: Diagnosis not present

## 2018-04-23 DIAGNOSIS — Z1389 Encounter for screening for other disorder: Secondary | ICD-10-CM | POA: Diagnosis not present

## 2018-04-23 DIAGNOSIS — E1129 Type 2 diabetes mellitus with other diabetic kidney complication: Secondary | ICD-10-CM | POA: Diagnosis not present

## 2018-04-23 DIAGNOSIS — Z0000 Encounter for general adult medical examination without abnormal findings: Secondary | ICD-10-CM | POA: Diagnosis not present

## 2018-04-23 DIAGNOSIS — D649 Anemia, unspecified: Secondary | ICD-10-CM | POA: Diagnosis not present

## 2018-04-23 DIAGNOSIS — H353221 Exudative age-related macular degeneration, left eye, with active choroidal neovascularization: Secondary | ICD-10-CM | POA: Diagnosis not present

## 2018-04-23 DIAGNOSIS — E113293 Type 2 diabetes mellitus with mild nonproliferative diabetic retinopathy without macular edema, bilateral: Secondary | ICD-10-CM | POA: Diagnosis not present

## 2018-05-10 ENCOUNTER — Ambulatory Visit (HOSPITAL_COMMUNITY)
Admission: RE | Admit: 2018-05-10 | Discharge: 2018-05-10 | Disposition: A | Discharge status: Home / Self Care | Payer: Medicare Other | Source: Ambulatory Visit | Attending: Cardiovascular Disease | Admitting: Cardiovascular Disease

## 2018-05-10 DIAGNOSIS — I6523 Occlusion and stenosis of bilateral carotid arteries: Secondary | ICD-10-CM | POA: Diagnosis not present

## 2018-05-11 ENCOUNTER — Ambulatory Visit (INDEPENDENT_AMBULATORY_CARE_PROVIDER_SITE_OTHER): Payer: Medicare Other

## 2018-05-11 DIAGNOSIS — I359 Nonrheumatic aortic valve disorder, unspecified: Secondary | ICD-10-CM

## 2018-05-11 DIAGNOSIS — Z5181 Encounter for therapeutic drug level monitoring: Secondary | ICD-10-CM

## 2018-05-11 DIAGNOSIS — Z952 Presence of prosthetic heart valve: Secondary | ICD-10-CM

## 2018-05-11 LAB — POCT INR: INR: 2.2 (ref 2.0–3.0)

## 2018-05-11 NOTE — Patient Instructions (Signed)
Continue on same dosage 1 tablet every day except 1/2 tablet on Wednesdays.  Pt prefers 3 weeks.  Recheck INR in 3 weeks. Call our office if you have any problems or concerns [phone].

## 2018-05-12 ENCOUNTER — Telehealth: Payer: Self-pay | Admitting: *Deleted

## 2018-05-12 DIAGNOSIS — I6523 Occlusion and stenosis of bilateral carotid arteries: Secondary | ICD-10-CM

## 2018-05-12 NOTE — Telephone Encounter (Signed)
-----   Message from Burtis Junes, NP sent at 05/11/2018  8:38 PM EDT ----- Reviewed for Richardson Dopp, PA Ok to report.  Doppler study with 40 to 59% R carotid stenosis and 1 to 39% on the left - recommended to have repeat study in 6 months.

## 2018-05-12 NOTE — Telephone Encounter (Signed)
Pt has been notified of carotid results by phone. Pt agreeable to repeat carotids in 6 months. I will send a message to [facility] to call pt and schedule carotids. I will also forward a copy of results to PCP Dr. Gaynelle Arabian. Pt thanked me for the call.

## 2018-05-19 ENCOUNTER — Ambulatory Visit
Admission: RE | Admit: 2018-05-19 | Discharge: 2018-05-19 | Disposition: A | Discharge status: Home / Self Care | Payer: Medicare Other | Source: Ambulatory Visit | Attending: Family Medicine | Admitting: Family Medicine

## 2018-05-19 DIAGNOSIS — Z1231 Encounter for screening mammogram for malignant neoplasm of breast: Secondary | ICD-10-CM | POA: Diagnosis not present

## 2018-05-29 ENCOUNTER — Other Ambulatory Visit: Payer: Self-pay | Admitting: Interventional Cardiology

## 2018-06-01 ENCOUNTER — Ambulatory Visit
Admission: RE | Admit: 2018-06-01 | Discharge: 2018-06-01 | Disposition: A | Discharge status: Home / Self Care | Payer: Medicare Other | Source: Ambulatory Visit | Attending: Family Medicine | Admitting: Family Medicine

## 2018-06-01 ENCOUNTER — Ambulatory Visit (INDEPENDENT_AMBULATORY_CARE_PROVIDER_SITE_OTHER): Payer: Medicare Other | Admitting: *Deleted

## 2018-06-01 ENCOUNTER — Other Ambulatory Visit: Payer: Self-pay | Admitting: Family Medicine

## 2018-06-01 DIAGNOSIS — R05 Cough: Secondary | ICD-10-CM

## 2018-06-01 DIAGNOSIS — Z952 Presence of prosthetic heart valve: Secondary | ICD-10-CM

## 2018-06-01 DIAGNOSIS — Z5181 Encounter for therapeutic drug level monitoring: Secondary | ICD-10-CM | POA: Diagnosis not present

## 2018-06-01 DIAGNOSIS — Z23 Encounter for immunization: Secondary | ICD-10-CM | POA: Diagnosis not present

## 2018-06-01 DIAGNOSIS — I359 Nonrheumatic aortic valve disorder, unspecified: Secondary | ICD-10-CM | POA: Diagnosis not present

## 2018-06-01 DIAGNOSIS — R053 Chronic cough: Secondary | ICD-10-CM

## 2018-06-01 LAB — POCT INR: INR: 2.3 (ref 2.0–3.0)

## 2018-06-01 NOTE — Patient Instructions (Signed)
Description   Continue on same dosage 1 tablet every day except 1/2 tablet on Wednesdays.  Pt prefers 3 weeks.  Recheck INR in 3 weeks. Call our office if you have any problems or concerns [phone].

## 2018-06-15 DIAGNOSIS — H35371 Puckering of macula, right eye: Secondary | ICD-10-CM | POA: Diagnosis not present

## 2018-06-15 DIAGNOSIS — H353222 Exudative age-related macular degeneration, left eye, with inactive choroidal neovascularization: Secondary | ICD-10-CM | POA: Diagnosis not present

## 2018-06-15 DIAGNOSIS — E113293 Type 2 diabetes mellitus with mild nonproliferative diabetic retinopathy without macular edema, bilateral: Secondary | ICD-10-CM | POA: Diagnosis not present

## 2018-06-15 DIAGNOSIS — H353112 Nonexudative age-related macular degeneration, right eye, intermediate dry stage: Secondary | ICD-10-CM | POA: Diagnosis not present

## 2018-06-22 ENCOUNTER — Ambulatory Visit (INDEPENDENT_AMBULATORY_CARE_PROVIDER_SITE_OTHER): Payer: Medicare Other

## 2018-06-22 DIAGNOSIS — I359 Nonrheumatic aortic valve disorder, unspecified: Secondary | ICD-10-CM

## 2018-06-22 DIAGNOSIS — Z5181 Encounter for therapeutic drug level monitoring: Secondary | ICD-10-CM | POA: Diagnosis not present

## 2018-06-22 DIAGNOSIS — Z952 Presence of prosthetic heart valve: Secondary | ICD-10-CM

## 2018-06-22 LAB — POCT INR: INR: 2.1 (ref 2.0–3.0)

## 2018-06-22 NOTE — Patient Instructions (Signed)
Continue on same dosage 1 tablet every day except 1/2 tablet on Wednesdays.  Pt prefers 3 weeks.  Recheck INR in 3 weeks. Call our office if you have any problems or concerns [phone].

## 2018-06-29 ENCOUNTER — Ambulatory Visit (INDEPENDENT_AMBULATORY_CARE_PROVIDER_SITE_OTHER): Payer: Medicare Other | Admitting: Pulmonary Disease

## 2018-06-29 ENCOUNTER — Other Ambulatory Visit: Payer: Self-pay | Admitting: Family Medicine

## 2018-06-29 DIAGNOSIS — R0600 Dyspnea, unspecified: Secondary | ICD-10-CM

## 2018-06-29 LAB — PULMONARY FUNCTION TEST
DL/VA % pred: 104 %
DL/VA: 4.48 ml/min/mmHg/L
DLCO UNC % PRED: 76 %
DLCO unc: 14.6 ml/min/mmHg
FEF 25-75 PRE: 0.73 L/s
FEF 25-75 Post: 1.12 L/sec
FEF2575-%Change-Post: 52 %
FEF2575-%PRED-POST: 106 %
FEF2575-%Pred-Pre: 69 %
FEV1-%CHANGE-POST: 10 %
FEV1-%PRED-POST: 83 %
FEV1-%Pred-Pre: 75 %
FEV1-POST: 1.24 L
FEV1-Pre: 1.13 L
FEV1FVC-%Change-Post: 0 %
FEV1FVC-%PRED-PRE: 98 %
FEV6-%Change-Post: 10 %
FEV6-%PRED-POST: 90 %
FEV6-%Pred-Pre: 81 %
FEV6-POST: 1.73 L
FEV6-Pre: 1.57 L
FEV6FVC-%Pred-Post: 106 %
FEV6FVC-%Pred-Pre: 106 %
FVC-%Change-Post: 10 %
FVC-%Pred-Post: 84 %
FVC-%Pred-Pre: 76 %
FVC-Post: 1.73 L
FVC-Pre: 1.57 L
PRE FEV6/FVC RATIO: 100 %
Post FEV1/FVC ratio: 72 %
Post FEV6/FVC ratio: 100 %
Pre FEV1/FVC ratio: 72 %
RV % PRED: 131 %
RV: 2.96 L
TLC % PRED: 103 %
TLC: 4.63 L

## 2018-06-29 NOTE — Progress Notes (Signed)
PFT done today. 

## 2018-07-06 ENCOUNTER — Ambulatory Visit (INDEPENDENT_AMBULATORY_CARE_PROVIDER_SITE_OTHER): Payer: Medicare Other | Admitting: Pulmonary Disease

## 2018-07-06 ENCOUNTER — Encounter: Payer: Self-pay | Admitting: Pulmonary Disease

## 2018-07-06 VITALS — BP 124/80 | HR 67 | Ht 61.0 in | Wt 142.0 lb

## 2018-07-06 DIAGNOSIS — I480 Paroxysmal atrial fibrillation: Secondary | ICD-10-CM

## 2018-07-06 DIAGNOSIS — R059 Cough, unspecified: Secondary | ICD-10-CM

## 2018-07-06 DIAGNOSIS — R05 Cough: Secondary | ICD-10-CM

## 2018-07-06 DIAGNOSIS — Z7901 Long term (current) use of anticoagulants: Secondary | ICD-10-CM | POA: Diagnosis not present

## 2018-07-06 DIAGNOSIS — Z952 Presence of prosthetic heart valve: Secondary | ICD-10-CM

## 2018-07-06 MED ORDER — OMEPRAZOLE 20 MG PO CPDR: 20.0000 mg | DELAYED_RELEASE_CAPSULE | Freq: Every day | ORAL | 5 refills | Status: DC

## 2018-07-06 NOTE — Progress Notes (Signed)
Synopsis: Referred in October 2019 for cough by Gaynelle Arabian, MD  Subjective:   PATIENT ID: [name] GENDER: female DOB: 08-29-1933, MRN: [id number]  Chief Complaint  Patient presents with  . Consult    Reports she has a dry cough  x 1 year, says it interrupts her conversatins and will not stop.     PMH DMII, Heart disease, CAD s/p Stent, CABGX4, AoV Replaced, Prosthetic on Coumadin. Cough for the past 1 year, dry, non-productive. No difference between indoor or outdoor. Usually worse during the day, not aware of night issues. No issues laying flat. No fevers, chills, night sweats weight. No heart burn or GERD symptoms. She does have spring allergies, may have noticed a worse cough then but unsure. Only thing that helps she can clear her cough with small sips of water. Home built 19 years ago. Was originally from [place], moved [place] for 19 years and moved here 19 years ago. Once husband retired they moved back to the [employer]. Two kids, 6 grandkids, married 57 years. Goes to [employer] here in [place].  Per the patient she was trialed off of her losartan for 6 weeks by her PCP.  This may no difference in her cough.  Patient has not traveled outside of the country.    Past Medical History:  Diagnosis Date  . Anemia   . Anginal pain ([place])   . Arteriovenous malformation of gastrointestinal tract   . Arthritis    "fingers mostly" (04/12/2015)  . Carotid artery disease ([place])    Carotid US 3/17:  74-94% RICA; 49-67% LICA; Elevated bilateral subclavian artery velocities >>f/u 1 year. // Carotid US 4/18: R 40-59; L 1-39 >> FU 1 year  . CHF (congestive heart failure) ([place])   . Chronic kidney disease (CKD), stage III (moderate) (HCC)   . Chronic lower GI bleeding    "today; last time was ~ 8 yr ago; used to have them often before that too" (02/11/2013)  . Coronary artery disease   . DJD (degenerative joint disease)   . Heart murmur   . History of blood transfusion    "a few times  over the years; usually related to my Coumadin" (04/12/2015  . History of gout   . Hyperlipemia   . Hypertension   . Macula lutea degeneration   . Old MI (myocardial infarction)    "a coulple /dr in 02/2008; I never even knew I'd had them" (04/12/2015)  . Type II diabetes mellitus (HCC)      Family History  Problem Relation Age of Onset  . Heart disease Mother   . Heart disease Father      Past Surgical History:  Procedure Laterality Date  . AORTIC VALVE REPLACEMENT  1993  . APPENDECTOMY  1953  . CARDIAC CATHETERIZATION  ~1990  . CARDIAC CATHETERIZATION  04/12/2015   Procedure: Coronary Stent Intervention;  Surgeon: Jettie Booze, MD;    SYNERGY DES 3.5X16 to the ostial RCA   . CARDIAC CATHETERIZATION  04/12/2015   Procedure: Coronary/Graft Angiography;  Surgeon: Eloy End, MD; LAD & CFX 100%, patent LIMA-LAD, SVG-D1; SVG-OM-PDA first limb 100%, 2nd limb patent; oRCA 75%>0 w/ stent  . [place]   St. Jude/notes 10/29/2003 (02/11/2013)  . COLONOSCOPY N/A 02/13/2013   Procedure: COLONOSCOPY;  Surgeon: Lafayette Dragon, MD;  Location: [facility] ENDOSCOPY;  Service: Endoscopy;  Laterality: N/A;  . CORONARY ANGIOPLASTY WITH STENT PLACEMENT  2009+   "3 at least; put  in 1 stent at a time" (02/11/2013)  . CORONARY ARTERY BYPASS GRAFT  1990   LIMA-LAD, SVG-OM-PDA, SVG-D1  . DILATION AND CURETTAGE OF UTERUS  1960's   'after a miscarriage" (02/11/2013)  . ENTEROSCOPY N/A 02/13/2013   Procedure: ENTEROSCOPY;  Surgeon: Lafayette Dragon, MD;  Location: [facility] ENDOSCOPY;  Service: Endoscopy;  Laterality: N/A;  . EXPLORATORY LAPAROTOMY  02/24/2008   which revealed a retroperitoneal hematoma and bleeding from the right external iliac artery/notes 03/02/2008 (02/11/2013)   . FLEXIBLE SIGMOIDOSCOPY N/A 04/21/2015   Procedure: FLEXIBLE SIGMOIDOSCOPY;  Surgeon: Ladene Artist, MD;  Location: [facility] ENDOSCOPY;  Service: Endoscopy;  Laterality: N/A;    Social History   Socioeconomic History  .  Marital status: Married    Spouse name: Not on file  . Number of children: Not on file  . Years of education: Not on file  . Highest education level: Not on file  Occupational History  . Not on file  Social Needs  . Financial resource strain: Not on file  . Food insecurity:    Worry: Not on file    Inability: Not on file  . Transportation needs:    Medical: Not on file    Non-medical: Not on file  Tobacco Use  . Smoking status: Never Smoker  . Smokeless tobacco: Never Used  Substance and Sexual Activity  . Alcohol use: No  . Drug use: No  . Sexual activity: Yes  Lifestyle  . Physical activity:    Days per week: Not on file    Minutes per session: Not on file  . Stress: Not on file  Relationships  . Social connections:    Talks on phone: Not on file    Gets together: Not on file    Attends religious service: Not on file    Active member of club or organization: Not on file    Attends meetings of clubs or organizations: Not on file    Relationship status: Not on file  . Intimate partner violence:    Fear of current or ex partner: Not on file    Emotionally abused: Not on file    Physically abused: Not on file    Forced sexual activity: Not on file  Other Topics Concern  . Not on file  Social History Narrative  . Not on file     Allergies  Allergen Reactions  . Darvon [Propoxyphene] Nausea And Vomiting  . Lisinopril Cough  . Septra [Sulfamethoxazole-Trimethoprim] Other (See Comments)    Increased INR  . Warfarin And Related Other (See Comments)    ONLY TOLERATES BRAND  . Penicillins Rash     Outpatient Medications Prior to Visit  Medication Sig Dispense Refill  . aspirin EC 81 MG tablet Take 1 tablet (81 mg total) by mouth daily.    Marland Kitchen atorvastatin (LIPITOR) 80 MG tablet Take 1 tablet (80 mg total) by mouth daily. 90 tablet 3  . bevacizumab (AVASTIN) 1.25 mg/0.1 mL SOLN Place 1.25 mg into the left eye every 8 (eight) weeks.    . Canagliflozin (INVOKANA) 100  MG TABS Take 100 mg by mouth daily.     Marland Kitchen COUMADIN 5 MG tablet USE AS DIRECTED BY  COUMADIN  CLINIC 40 tablet 3  . digoxin (LANOXIN) 0.125 MG tablet Take 0.125 mg by mouth daily.    Marland Kitchen ezetimibe (ZETIA) 10 MG tablet TAKE 1 TABLET BY MOUTH ONCE DAILY 90 tablet 2  . FeFum-FePo-FA-B Cmp-C-Zn-Mn-Cu (SE-TAN PLUS) 162-115.2-1 MG CAPS Take 1  tablet by mouth 2 (two) times daily.  3  . folic acid (FOLVITE) 967 MCG tablet Take 400 mcg by mouth every evening.     . furosemide (LASIX) 40 MG tablet Take 1 tablet (40 mg total) by mouth daily. 30 tablet 11  . glimepiride (AMARYL) 4 MG tablet Take 4 mg by mouth daily before breakfast.    . isosorbide mononitrate (IMDUR) 60 MG 24 hr tablet TAKE 1 TABLET BY MOUTH ONCE DAILY 90 tablet 2  . JANUVIA 50 MG tablet Take 50 mg by mouth daily.     Marland Kitchen losartan (COZAAR) 50 MG tablet Take 50 mg by mouth daily.    . metoprolol tartrate (LOPRESSOR) 25 MG tablet Take 0.5 tablets (12.5 mg total) by mouth 2 (two) times daily. 60 tablet 11  . Multiple Vitamin (MULTIVITAMIN WITH MINERALS) TABS Take 1 tablet by mouth daily.    . nitroGLYCERIN (NITROSTAT) 0.4 MG SL tablet Place 1 tablet (0.4 mg total) under the tongue every 5 (five) minutes as needed for chest pain. Please make appt for January. 1st attempt 25 tablet 0  . Omega-3 Fatty Acids 1200 MG CAPS Take 1 capsule by mouth 2 (two) times daily.     . potassium chloride (K-DUR,KLOR-CON) 10 MEQ tablet Take 20 mEq by mouth 2 (two) times daily.      No facility-administered medications prior to visit.     Review of Systems  Constitutional: Negative.   HENT: Negative.   Eyes: Negative.   Respiratory: Positive for cough and shortness of breath. Negative for hemoptysis, sputum production and wheezing.   Cardiovascular: Negative.   Gastrointestinal: Negative.   Musculoskeletal: Negative.   Skin: Negative.   Neurological: Negative.   Endo/Heme/Allergies: Negative.   Psychiatric/Behavioral: Negative.      Objective:  Physical  Exam  Constitutional: She is oriented to person, place, and time. She appears well-developed and well-nourished. No distress.  HENT:  Head: Normocephalic and atraumatic.  Mouth/Throat: Oropharynx is clear and moist.  Mallampati 3  Eyes: Pupils are equal, round, and reactive to light. Conjunctivae are normal. No scleral icterus.  Neck: Neck supple. No JVD present. No tracheal deviation present.  Cardiovascular: Normal rate, regular rhythm, normal heart sounds and intact distal pulses.  No murmur heard. Pulmonary/Chest: Effort normal and breath sounds normal. No accessory muscle usage or stridor. No tachypnea. No respiratory distress. She has no wheezes. She has no rhonchi. She has no rales.  Abdominal: Soft. Bowel sounds are normal. She exhibits no distension. There is no tenderness.  Musculoskeletal: She exhibits edema (Greater in RLE than LLE). She exhibits no tenderness.  Lymphadenopathy:    She has no cervical adenopathy.  Neurological: She is alert and oriented to person, place, and time.  Skin: Skin is warm and dry. Capillary refill takes more than 3 seconds. No rash noted.  Psychiatric: She has a normal mood and affect. Her behavior is normal.  Vitals reviewed.    Vitals:   07/06/18 0843  BP: 124/80  Pulse: 67  SpO2: 97%  Weight: 142 lb (64.4 kg)  Height: 5\' 1"  (1.549 m)   97% on RA BMI Readings from Last 3 Encounters:  07/06/18 26.83 kg/m  11/04/17 27.37 kg/m  10/16/16 26.95 kg/m   Wt Readings from Last 3 Encounters:  07/06/18 142 lb (64.4 kg)  11/04/17 140 lb 1.9 oz (63.6 kg)  10/16/16 138 lb (62.6 kg)     CBC    Component Value Date/Time   WBC 6.8 07/05/2015 1214  RBC 3.99 07/05/2015 1214   HGB 12.2 07/05/2015 1214   HCT 34.3 (L) 07/05/2015 1214   PLT 239 07/05/2015 1214   MCV 86.0 07/05/2015 1214   MCH 30.6 07/05/2015 1214   MCHC 35.6 07/05/2015 1214   RDW 13.9 07/05/2015 1214   LYMPHSABS 1.8 07/05/2015 1214   MONOABS 0.7 07/05/2015 1214   EOSABS  0.2 07/05/2015 1214   BASOSABS 0.0 07/05/2015 1214    Chest Imaging: 06/01/2018 chest x-ray: No pulmonary process, chest with sternal wires status post surgery. The patient's images have been independently reviewed by me.    Pulmonary Functions Testing Results:   PFT Results Latest Ref Rng & Units 06/29/2018  FVC-Pre L 1.57  FVC-Predicted Pre % 76  FVC-Post L 1.73  FVC-Predicted Post % 84  Pre FEV1/FVC % % 72  Post FEV1/FCV % % 72  FEV1-Pre L 1.13  FEV1-Predicted Pre % 75  DLCO UNC% % 76  DLCO COR %Predicted % 104     FeNO: None   Pathology: None   Echocardiogram:  04/10/2015 Study Conclusions - Left ventricle: The cavity size was normal. Wall thickness was   increased in a pattern of mild LVH. Systolic function was normal.   The estimated ejection fraction was in the range of 60% to 65%. - Aortic valve: AV prosthesis appears to move well Peak and mean   gradients through the valve are 57 and 28 mm Hg respectively.   There was mild regurgitation. - Mitral valve: Calcified annulus. Mildly thickened leaflets .   There was mild regurgitation. - Left atrium: The atrium was mildly dilated.  Heart Catheterization:  L[facility] 04/12/2015  Severe native three-vessel coronary artery disease. Patent LIMA to LAD. Patent SVG to diagonal. Patent native RCA which feeds collaterals to the circumflex system.  SVG to OM/PDA jump graft occluded proximally. The second half of this graft is open. Therefore, flow is going from the native RCA, and then retrograde into the vein graft which feeds the obtuse marginal.  Patent stents in the RCA and posterior lateral artery.  Culprit lesion for her symptoms was the new lesion in the ostium of the RCA. This was successfully treated with a 3.5 x 16 Synergy drug-eluting stent, postdilated to greater than 4 mm in diameter.  Ost RCA lesion, 75% stenosed. There is a 0% residual stenosis post intervention. The lesion was not previously treated.  A  drug-eluting stent was placed.    Assessment & Plan:   Cough - Plan: CANCELED: Pulmonary function test  PAF (paroxysmal atrial fibrillation) (HCC)  S/P AVR (aortic valve replacement)  Anticoagulated  Discussion:  This is an 84 year old female with a history of chronic cough, dry, nonproductive for the past year.  She has noticed it worse over the past few months.  She recently had pulmonary function test with no evidence of obstruction normal ratio, RV with evidence of air trapping.,  DLCO normal.  Today we discussed several etiologies of chronic cough.  Most of these are grouped and to gastroesophageal reflux disease, reflux laryngitis, silent reflux, PND/upper airway cough syndrome, allergic rhinitis as well as cough variant asthma.  We will pursue a conservative approach as she has a relatively normal chest x-ray and normal pulmonary function test despite some evidence of air trapping.    We also discussed the possibility of using as needed albuterol during this time.  She is not very interested in using an inhaler at the moment.  We will start omeprazole 20 mg daily and follow-up in  6 weeks to see if this improves her cough.    Current Outpatient Medications:  .  aspirin EC 81 MG tablet, Take 1 tablet (81 mg total) by mouth daily., Disp: , Rfl:  .  atorvastatin (LIPITOR) 80 MG tablet, Take 1 tablet (80 mg total) by mouth daily., Disp: 90 tablet, Rfl: 3 .  bevacizumab (AVASTIN) 1.25 mg/0.1 mL SOLN, Place 1.25 mg into the left eye every 8 (eight) weeks., Disp: , Rfl:  .  Canagliflozin (INVOKANA) 100 MG TABS, Take 100 mg by mouth daily. , Disp: , Rfl:  .  COUMADIN 5 MG tablet, USE AS DIRECTED BY  COUMADIN  CLINIC, Disp: 40 tablet, Rfl: 3 .  digoxin (LANOXIN) 0.125 MG tablet, Take 0.125 mg by mouth daily., Disp: , Rfl:  .  ezetimibe (ZETIA) 10 MG tablet, TAKE 1 TABLET BY MOUTH ONCE DAILY, Disp: 90 tablet, Rfl: 2 .  FeFum-FePo-FA-B Cmp-C-Zn-Mn-Cu (SE-TAN PLUS) 162-115.2-1 MG CAPS,  Take 1 tablet by mouth 2 (two) times daily., Disp: , Rfl: 3 .  folic acid (FOLVITE) 035 MCG tablet, Take 400 mcg by mouth every evening. , Disp: , Rfl:  .  furosemide (LASIX) 40 MG tablet, Take 1 tablet (40 mg total) by mouth daily., Disp: 30 tablet, Rfl: 11 .  glimepiride (AMARYL) 4 MG tablet, Take 4 mg by mouth daily before breakfast., Disp: , Rfl:  .  isosorbide mononitrate (IMDUR) 60 MG 24 hr tablet, TAKE 1 TABLET BY MOUTH ONCE DAILY, Disp: 90 tablet, Rfl: 2 .  JANUVIA 50 MG tablet, Take 50 mg by mouth daily. , Disp: , Rfl:  .  losartan (COZAAR) 50 MG tablet, Take 50 mg by mouth daily., Disp: , Rfl:  .  metoprolol tartrate (LOPRESSOR) 25 MG tablet, Take 0.5 tablets (12.5 mg total) by mouth 2 (two) times daily., Disp: 60 tablet, Rfl: 11 .  Multiple Vitamin (MULTIVITAMIN WITH MINERALS) TABS, Take 1 tablet by mouth daily., Disp: , Rfl:  .  nitroGLYCERIN (NITROSTAT) 0.4 MG SL tablet, Place 1 tablet (0.4 mg total) under the tongue every 5 (five) minutes as needed for chest pain. Please make appt for January. 1st attempt, Disp: 25 tablet, Rfl: 0 .  Omega-3 Fatty Acids 1200 MG CAPS, Take 1 capsule by mouth 2 (two) times daily. , Disp: , Rfl:  .  potassium chloride (K-DUR,KLOR-CON) 10 MEQ tablet, Take 20 mEq by mouth 2 (two) times daily. , Disp: , Rfl:  .  omeprazole (PRILOSEC) 20 MG capsule, Take 1 capsule (20 mg total) by mouth daily., Disp: 30 capsule, Rfl: 5   Garner Nash, DO [facility] 07/06/2018 9:22 AM

## 2018-07-06 NOTE — Patient Instructions (Addendum)
Thank you for visiting Dr. Valeta Harms at [facility]. Today we recommend the following: No orders of the defined types were placed in this encounter.  Meds ordered this encounter  Medications  . omeprazole (PRILOSEC) 20 MG capsule    Sig: Take 1 capsule (20 mg total) by mouth daily.    Dispense:  30 capsule    Refill:  5   Return in about 6 weeks (around 08/17/2018).

## 2018-07-13 ENCOUNTER — Ambulatory Visit (INDEPENDENT_AMBULATORY_CARE_PROVIDER_SITE_OTHER): Payer: Medicare Other

## 2018-07-13 DIAGNOSIS — I359 Nonrheumatic aortic valve disorder, unspecified: Secondary | ICD-10-CM | POA: Diagnosis not present

## 2018-07-13 DIAGNOSIS — Z5181 Encounter for therapeutic drug level monitoring: Secondary | ICD-10-CM

## 2018-07-13 DIAGNOSIS — Z952 Presence of prosthetic heart valve: Secondary | ICD-10-CM

## 2018-07-13 LAB — POCT INR: INR: 2.1 (ref 2.0–3.0)

## 2018-07-13 NOTE — Patient Instructions (Signed)
Continue on same dosage 1 tablet every day except 1/2 tablet on Wednesdays.  Pt prefers 3 weeks.  Recheck INR in 3 weeks. Call our office if you have any problems or concerns [phone].

## 2018-08-03 ENCOUNTER — Ambulatory Visit (INDEPENDENT_AMBULATORY_CARE_PROVIDER_SITE_OTHER): Payer: Medicare Other | Admitting: Pharmacist

## 2018-08-03 DIAGNOSIS — I359 Nonrheumatic aortic valve disorder, unspecified: Secondary | ICD-10-CM

## 2018-08-03 DIAGNOSIS — Z5181 Encounter for therapeutic drug level monitoring: Secondary | ICD-10-CM

## 2018-08-03 DIAGNOSIS — Z952 Presence of prosthetic heart valve: Secondary | ICD-10-CM | POA: Diagnosis not present

## 2018-08-03 LAB — POCT INR: INR: 2.5 (ref 2.0–3.0)

## 2018-08-03 NOTE — Patient Instructions (Signed)
Description   Continue on same dosage 1 tablet every day except 1/2 tablet on Wednesdays.  Pt prefers 3 weeks.  Recheck INR in 3 weeks. Call our office if you have any problems or concerns [phone].

## 2018-08-17 ENCOUNTER — Ambulatory Visit: Payer: Medicare Other | Admitting: Pulmonary Disease

## 2018-08-24 ENCOUNTER — Ambulatory Visit (INDEPENDENT_AMBULATORY_CARE_PROVIDER_SITE_OTHER): Payer: Medicare Other | Admitting: *Deleted

## 2018-08-24 ENCOUNTER — Ambulatory Visit (INDEPENDENT_AMBULATORY_CARE_PROVIDER_SITE_OTHER): Payer: Medicare Other | Admitting: Pulmonary Disease

## 2018-08-24 ENCOUNTER — Encounter: Payer: Self-pay | Admitting: Pulmonary Disease

## 2018-08-24 VITALS — BP 134/60 | HR 61 | Ht 61.0 in | Wt 140.8 lb

## 2018-08-24 DIAGNOSIS — Z7901 Long term (current) use of anticoagulants: Secondary | ICD-10-CM | POA: Diagnosis not present

## 2018-08-24 DIAGNOSIS — Z952 Presence of prosthetic heart valve: Secondary | ICD-10-CM | POA: Diagnosis not present

## 2018-08-24 DIAGNOSIS — Z5181 Encounter for therapeutic drug level monitoring: Secondary | ICD-10-CM | POA: Diagnosis not present

## 2018-08-24 DIAGNOSIS — R05 Cough: Secondary | ICD-10-CM

## 2018-08-24 DIAGNOSIS — I359 Nonrheumatic aortic valve disorder, unspecified: Secondary | ICD-10-CM

## 2018-08-24 DIAGNOSIS — R059 Cough, unspecified: Secondary | ICD-10-CM

## 2018-08-24 LAB — POCT INR: INR: 2.6 (ref 2.0–3.0)

## 2018-08-24 MED ORDER — MOMETASONE FUROATE 100 MCG/ACT IN AERO: 1.0000 | INHALATION_SPRAY | Freq: Two times a day (BID) | RESPIRATORY_TRACT | 0 refills | Status: DC

## 2018-08-24 NOTE — Patient Instructions (Addendum)
Description   Today take 1/2 tablet then continue on same dosage 1 tablet every day except 1/2 tablet on Wednesdays.  Pt prefers 3 weeks.  Recheck INR in 2 weeks. Call our office if you have any problems or concerns [phone].

## 2018-08-24 NOTE — Patient Instructions (Addendum)
Thank you for visiting Dr. Valeta Harms at [facility]. Today we recommend the following: No orders of the defined types were placed in this encounter.  Meds ordered this encounter  Medications  . Mometasone Furoate (ASMANEX HFA) 100 MCG/ACT AERO    Sig: Inhale 1 puff into the lungs 2 (two) times daily.    Dispense:  1 Inhaler    Refill:  0    Order Specific Question:   Lot Number?    Answer:   [id number]    Order Specific Question:   Expiration Date?    Answer:   07/26/2019    Order Specific Question:   Manufacturer?    Answer:   [employer] [[id number]]    Order Specific Question:   Quantity    Answer:   1   Return in about 6 months (around 02/23/2019).

## 2018-08-24 NOTE — Progress Notes (Signed)
Synopsis: Referred in October 2019 for cough by Gaynelle Arabian, MD  Subjective:   PATIENT ID: [name] GENDER: female DOB: Mar 08, 1933, MRN: [id number]  Chief Complaint  Patient presents with  . Consult    States she has had a dry cough for 1 year. States she does have SOB r/t CHF. Statesher cough is worse in the summer. She tried proilosec for 1 month with no improvement.     PMH DMII, Heart disease, CAD s/p Stent, CABGX4, AoV Replaced, Prosthetic on Coumadin. Cough for the past 1 year, dry, non-productive. No difference between indoor or outdoor. Usually worse during the day, not aware of night issues. No issues laying flat. No fevers, chills, night sweats weight. No heart burn or GERD symptoms. She does have spring allergies, may have noticed a worse cough then but unsure. Only thing that helps she can clear her cough with small sips of water. Home built 19 years ago. Was originally from [place], moved [place] for 19 years and moved here 19 years ago. Once husband retired they moved back to the [place]. Two kids, 6 grandkids, married 65 years. Goes to [employer] here in [place]. Per the patient she was trialed off of her losartan for 6 weeks by her PCP.  This may no difference in her cough.  Patient has not traveled outside of the country.  OV 08/24/2018: cough is exactly the same. Its worse in the summer time. It this time of year it has been much better and more tolerable.  She took a PPI for a month with no difference in her cough.  She does believe that her allergies are worse during the summertime and this may relate to her worsening symptoms.  Currently her cough is tolerable.  She states that it is dry and usually worse in the evenings.  Patient denies hemoptysis.   Past Medical History:  Diagnosis Date  . Anemia   . Anginal pain ([place])   . Arteriovenous malformation of gastrointestinal tract   . Arthritis    "fingers mostly" (04/12/2015)  . Carotid artery disease ([place])    Carotid US 3/17:  38-75% RICA; 64-33% LICA; Elevated bilateral subclavian artery velocities >>f/u 1 year. // Carotid US 4/18: R 40-59; L 1-39 >> FU 1 year  . CHF (congestive heart failure) ([place])   . Chronic kidney disease (CKD), stage III (moderate) (HCC)   . Chronic lower GI bleeding    "today; last time was ~ 8 yr ago; used to have them often before that too" (02/11/2013)  . Coronary artery disease   . DJD (degenerative joint disease)   . Heart murmur   . History of blood transfusion    "a few times over the years; usually related to my Coumadin" (04/12/2015  . History of gout   . Hyperlipemia   . Hypertension   . Macula lutea degeneration   . Old MI (myocardial infarction)    "a coulple /dr in 02/2008; I never even knew I'd had them" (04/12/2015)  . Type II diabetes mellitus (HCC)      Family History  Problem Relation Age of Onset  . Heart disease Mother   . Heart disease Father      Past Surgical History:  Procedure Laterality Date  . AORTIC VALVE REPLACEMENT  1993  . APPENDECTOMY  1953  . CARDIAC CATHETERIZATION  ~1990  . CARDIAC CATHETERIZATION  04/12/2015   Procedure: Coronary Stent Intervention;  Surgeon: Jettie Booze, MD;    [facility]  DES 3.5X16 to the ostial RCA   . CARDIAC CATHETERIZATION  04/12/2015   Procedure: Coronary/Graft Angiography;  Surgeon: Eloy End, MD; LAD & CFX 100%, patent LIMA-LAD, SVG-D1; SVG-OM-PDA first limb 100%, 2nd limb patent; oRCA 75%>0 w/ stent  . [place]   St[employer] Jude/notes 10/29/2003 (02/11/2013)  . COLONOSCOPY N/A 02/13/2013   Procedure: COLONOSCOPY;  Surgeon: Lafayette Dragon, MD;  Location: [facility] ENDOSCOPY;  Service: Endoscopy;  Laterality: N/A;  . CORONARY ANGIOPLASTY WITH STENT PLACEMENT  2009+   "3 at least; put in 1 stent at a time" (02/11/2013)  . CORONARY ARTERY BYPASS GRAFT  1990   LIMA-LAD, SVG-OM-PDA, SVG-D1  . DILATION AND CURETTAGE OF UTERUS  1960's   'after a miscarriage" (02/11/2013)  . ENTEROSCOPY N/A  02/13/2013   Procedure: ENTEROSCOPY;  Surgeon: Lafayette Dragon, MD;  Location: [facility] ENDOSCOPY;  Service: Endoscopy;  Laterality: N/A;  . EXPLORATORY LAPAROTOMY  02/24/2008   which revealed a retroperitoneal hematoma and bleeding from the right external iliac artery/notes 03/02/2008 (02/11/2013)   . FLEXIBLE SIGMOIDOSCOPY N/A 04/21/2015   Procedure: FLEXIBLE SIGMOIDOSCOPY;  Surgeon: Ladene Artist, MD;  Location: [facility] ENDOSCOPY;  Service: Endoscopy;  Laterality: N/A;    Social History   Socioeconomic History  . Marital status: Married    Spouse name: Not on file  . Number of children: Not on file  . Years of education: Not on file  . Highest education level: Not on file  Occupational History  . Not on file  Social Needs  . Financial resource strain: Not on file  . Food insecurity:    Worry: Not on file    Inability: Not on file  . Transportation needs:    Medical: Not on file    Non-medical: Not on file  Tobacco Use  . Smoking status: Never Smoker  . Smokeless tobacco: Never Used  Substance and Sexual Activity  . Alcohol use: No  . Drug use: No  . Sexual activity: Yes  Lifestyle  . Physical activity:    Days per week: Not on file    Minutes per session: Not on file  . Stress: Not on file  Relationships  . Social connections:    Talks on phone: Not on file    Gets together: Not on file    Attends religious service: Not on file    Active member of club or organization: Not on file    Attends meetings of clubs or organizations: Not on file    Relationship status: Not on file  . Intimate partner violence:    Fear of current or ex partner: Not on file    Emotionally abused: Not on file    Physically abused: Not on file    Forced sexual activity: Not on file  Other Topics Concern  . Not on file  Social History Narrative  . Not on file     Allergies  Allergen Reactions  . Darvon [Propoxyphene] Nausea And Vomiting  . Lisinopril Cough  . Septra [Sulfamethoxazole-Trimethoprim]  Other (See Comments)    Increased INR  . Warfarin And Related Other (See Comments)    ONLY TOLERATES BRAND  . Penicillins Rash     Outpatient Medications Prior to Visit  Medication Sig Dispense Refill  . aspirin EC 81 MG tablet Take 1 tablet (81 mg total) by mouth daily.    Marland Kitchen atorvastatin (LIPITOR) 80 MG tablet Take 1 tablet (80 mg total) by mouth daily. 90 tablet 3  .  bevacizumab (AVASTIN) 1.25 mg/0.1 mL SOLN Place 1.25 mg into the left eye every 8 (eight) weeks.    . Canagliflozin (INVOKANA) 100 MG TABS Take 100 mg by mouth daily.     Marland Kitchen COUMADIN 5 MG tablet USE AS DIRECTED BY  COUMADIN  CLINIC 40 tablet 3  . digoxin (LANOXIN) 0.125 MG tablet Take 0.125 mg by mouth daily.    Marland Kitchen ezetimibe (ZETIA) 10 MG tablet TAKE 1 TABLET BY MOUTH ONCE DAILY 90 tablet 2  . FeFum-FePo-FA-B Cmp-C-Zn-Mn-Cu (SE-TAN PLUS) 162-115.2-1 MG CAPS Take 1 tablet by mouth 2 (two) times daily.  3  . folic acid (FOLVITE) 397 MCG tablet Take 400 mcg by mouth every evening.     . furosemide (LASIX) 40 MG tablet Take 1 tablet (40 mg total) by mouth daily. 30 tablet 11  . glimepiride (AMARYL) 4 MG tablet Take 4 mg by mouth daily before breakfast.    . isosorbide mononitrate (IMDUR) 60 MG 24 hr tablet TAKE 1 TABLET BY MOUTH ONCE DAILY 90 tablet 2  . JANUVIA 50 MG tablet Take 50 mg by mouth daily.     Marland Kitchen losartan (COZAAR) 50 MG tablet Take 50 mg by mouth daily.    . metoprolol tartrate (LOPRESSOR) 25 MG tablet Take 0.5 tablets (12.5 mg total) by mouth 2 (two) times daily. 60 tablet 11  . Multiple Vitamin (MULTIVITAMIN WITH MINERALS) TABS Take 1 tablet by mouth daily.    . nitroGLYCERIN (NITROSTAT) 0.4 MG SL tablet Place 1 tablet (0.4 mg total) under the tongue every 5 (five) minutes as needed for chest pain. Please make appt for January. 1st attempt 25 tablet 0  . Omega-3 Fatty Acids 1200 MG CAPS Take 1 capsule by mouth 2 (two) times daily.     Marland Kitchen omeprazole (PRILOSEC) 20 MG capsule Take 1 capsule (20 mg total) by mouth daily.  30 capsule 5  . potassium chloride (K-DUR,KLOR-CON) 10 MEQ tablet Take 20 mEq by mouth 2 (two) times daily.      No facility-administered medications prior to visit.     Review of Systems  Constitutional: Negative.   HENT: Negative.   Eyes: Negative.   Respiratory: Positive for cough and shortness of breath. Negative for hemoptysis, sputum production and wheezing.   Cardiovascular: Positive for leg swelling. Negative for chest pain, palpitations, orthopnea, claudication and PND.  Gastrointestinal: Negative.   Genitourinary: Negative.   Musculoskeletal: Negative.   Skin: Negative.   Neurological: Negative.   Endo/Heme/Allergies: Positive for environmental allergies. Negative for polydipsia. Does not bruise/bleed easily.  Psychiatric/Behavioral: Negative.      Objective:  Physical Exam  Constitutional: She is oriented to person, place, and time. She appears well-developed and well-nourished. No distress.  HENT:  Head: Normocephalic and atraumatic.  Mouth/Throat: Oropharynx is clear and moist.  Eyes: Pupils are equal, round, and reactive to light. Conjunctivae are normal. No scleral icterus.  Neck: Neck supple. No JVD present. No tracheal deviation present.  Cardiovascular: Normal rate, regular rhythm, normal heart sounds and intact distal pulses.  No murmur heard. Pulmonary/Chest: Effort normal and breath sounds normal. No accessory muscle usage or stridor. No tachypnea. No respiratory distress. She has no wheezes. She has no rhonchi. She has no rales.  Abdominal: Soft. Bowel sounds are normal. She exhibits no distension. There is no tenderness.  Musculoskeletal: She exhibits no edema or tenderness.  Lymphadenopathy:    She has no cervical adenopathy.  Neurological: She is alert and oriented to person, place, and time.  Skin: Skin  is warm and dry. Capillary refill takes less than 2 seconds. No rash noted.  Psychiatric: She has a normal mood and affect. Her behavior is normal.    Vitals reviewed.  Vitals:   08/24/18 1145  BP: 134/60  Pulse: 61  SpO2: 97%  Weight: 140 lb 12.8 oz (63.9 kg)  Height: 5\' 1"  (1.549 m)   97% on RA BMI Readings from Last 3 Encounters:  08/24/18 26.60 kg/m  07/06/18 26.83 kg/m  11/04/17 27.37 kg/m   Wt Readings from Last 3 Encounters:  08/24/18 140 lb 12.8 oz (63.9 kg)  07/06/18 142 lb (64.4 kg)  11/04/17 140 lb 1.9 oz (63.6 kg)     CBC    Component Value Date/Time   WBC 6.8 07/05/2015 1214   RBC 3.99 07/05/2015 1214   HGB 12.2 07/05/2015 1214   HCT 34.3 (L) 07/05/2015 1214   PLT 239 07/05/2015 1214   MCV 86.0 07/05/2015 1214   MCH 30.6 07/05/2015 1214   MCHC 35.6 07/05/2015 1214   RDW 13.9 07/05/2015 1214   LYMPHSABS 1.8 07/05/2015 1214   MONOABS 0.7 07/05/2015 1214   EOSABS 0.2 07/05/2015 1214   BASOSABS 0.0 07/05/2015 1214    Chest Imaging: 06/01/2018 chest x-ray: No pulmonary process, chest with sternal wires status post surgery. The patient's images have been independently reviewed by me.    Pulmonary Functions Testing Results:   PFT Results Latest Ref Rng & Units 06/29/2018  FVC-Pre L 1.57  FVC-Predicted Pre % 76  FVC-Post L 1.73  FVC-Predicted Post % 84  Pre FEV1/FVC % % 72  Post FEV1/FCV % % 72  FEV1-Pre L 1.13  FEV1-Predicted Pre % 75  FEV1-Post L 1.24  DLCO UNC% % 76  DLCO COR %Predicted % 104  TLC L 4.63  TLC % Predicted % 103  RV % Predicted % 131     FeNO: None   Pathology: None   Echocardiogram:  04/10/2015 Study Conclusions - Left ventricle: The cavity size was normal. Wall thickness was   increased in a pattern of mild LVH. Systolic function was normal.   The estimated ejection fraction was in the range of 60% to 65%. - Aortic valve: AV prosthesis appears to move well Peak and mean   gradients through the valve are 57 and 28 mm Hg respectively.   There was mild regurgitation. - Mitral valve: Calcified annulus. Mildly thickened leaflets .   There was mild  regurgitation. - Left atrium: The atrium was mildly dilated.  Heart Catheterization:  LHC 04/12/2015  Severe native three-vessel coronary artery disease. Patent LIMA to LAD. Patent SVG to diagonal. Patent native RCA which feeds collaterals to the circumflex system.  SVG to OM/PDA jump graft occluded proximally. The second half of this graft is open. Therefore, flow is going from the native RCA, and then retrograde into the vein graft which feeds the obtuse marginal.  Patent stents in the RCA and posterior lateral artery.  Culprit lesion for her symptoms was the new lesion in the ostium of the RCA. This was successfully treated with a 3.5 x 16 Synergy drug-eluting stent, postdilated to greater than 4 mm in diameter.  Ost RCA lesion, 75% stenosed. There is a 0% residual stenosis post intervention. The lesion was not previously treated.  A drug-eluting stent was placed.    Assessment & Plan:   Cough  S/P AVR (aortic valve replacement)  Anticoagulated  Discussion:  This is an 84 year old female with a history of chronic cough, dry, nonproductive.  This has been going on for greater than a year.  Is usually worse in the summertime.  Pulmonary function test with no evidence of obstruction and mild air trapping with a normal DLCO.  She has tried a month of PPI with no relief in symptoms.  She does note that her cough is better during the wintertime than it is during the summer.  She honestly would rather just deal with her cough then being put on several different medications to try to suppress or control it.   We gave her a sample of a steroid inhaler to see if it makes any difference as a trial.  She was willing to try this to see if it made her cough any better.  Greater than 50% of this patient's 25-minute visit was spent face-to-face discussing the recommendation treatment.  Return to clinic in 6 months.   Current Outpatient Medications:  .  aspirin EC 81 MG tablet, Take 1  tablet (81 mg total) by mouth daily., Disp: , Rfl:  .  atorvastatin (LIPITOR) 80 MG tablet, Take 1 tablet (80 mg total) by mouth daily., Disp: 90 tablet, Rfl: 3 .  bevacizumab (AVASTIN) 1.25 mg/0.1 mL SOLN, Place 1.25 mg into the left eye every 8 (eight) weeks., Disp: , Rfl:  .  Canagliflozin (INVOKANA) 100 MG TABS, Take 100 mg by mouth daily. , Disp: , Rfl:  .  COUMADIN 5 MG tablet, USE AS DIRECTED BY  COUMADIN  CLINIC, Disp: 40 tablet, Rfl: 3 .  digoxin (LANOXIN) 0.125 MG tablet, Take 0.125 mg by mouth daily., Disp: , Rfl:  .  ezetimibe (ZETIA) 10 MG tablet, TAKE 1 TABLET BY MOUTH ONCE DAILY, Disp: 90 tablet, Rfl: 2 .  FeFum-FePo-FA-B Cmp-C-Zn-Mn-Cu (SE-TAN PLUS) 162-115.2-1 MG CAPS, Take 1 tablet by mouth 2 (two) times daily., Disp: , Rfl: 3 .  folic acid (FOLVITE) 629 MCG tablet, Take 400 mcg by mouth every evening. , Disp: , Rfl:  .  furosemide (LASIX) 40 MG tablet, Take 1 tablet (40 mg total) by mouth daily., Disp: 30 tablet, Rfl: 11 .  glimepiride (AMARYL) 4 MG tablet, Take 4 mg by mouth daily before breakfast., Disp: , Rfl:  .  isosorbide mononitrate (IMDUR) 60 MG 24 hr tablet, TAKE 1 TABLET BY MOUTH ONCE DAILY, Disp: 90 tablet, Rfl: 2 .  JANUVIA 50 MG tablet, Take 50 mg by mouth daily. , Disp: , Rfl:  .  losartan (COZAAR) 50 MG tablet, Take 50 mg by mouth daily., Disp: , Rfl:  .  metoprolol tartrate (LOPRESSOR) 25 MG tablet, Take 0.5 tablets (12.5 mg total) by mouth 2 (two) times daily., Disp: 60 tablet, Rfl: 11 .  Multiple Vitamin (MULTIVITAMIN WITH MINERALS) TABS, Take 1 tablet by mouth daily., Disp: , Rfl:  .  nitroGLYCERIN (NITROSTAT) 0.4 MG SL tablet, Place 1 tablet (0.4 mg total) under the tongue every 5 (five) minutes as needed for chest pain. Please make appt for January. 1st attempt, Disp: 25 tablet, Rfl: 0 .  Omega-3 Fatty Acids 1200 MG CAPS, Take 1 capsule by mouth 2 (two) times daily. , Disp: , Rfl:  .  omeprazole (PRILOSEC) 20 MG capsule, Take 1 capsule (20 mg total) by mouth  daily., Disp: 30 capsule, Rfl: 5 .  potassium chloride (K-DUR,KLOR-CON) 10 MEQ tablet, Take 20 mEq by mouth 2 (two) times daily. , Disp: , Rfl:    Garner Nash, DO [facility] 08/24/2018 12:11 PM

## 2018-08-31 DIAGNOSIS — H353112 Nonexudative age-related macular degeneration, right eye, intermediate dry stage: Secondary | ICD-10-CM | POA: Diagnosis not present

## 2018-08-31 DIAGNOSIS — H353222 Exudative age-related macular degeneration, left eye, with inactive choroidal neovascularization: Secondary | ICD-10-CM | POA: Diagnosis not present

## 2018-09-07 ENCOUNTER — Ambulatory Visit (INDEPENDENT_AMBULATORY_CARE_PROVIDER_SITE_OTHER): Payer: Medicare Other | Admitting: Pharmacist

## 2018-09-07 DIAGNOSIS — Z5181 Encounter for therapeutic drug level monitoring: Secondary | ICD-10-CM

## 2018-09-07 DIAGNOSIS — Z952 Presence of prosthetic heart valve: Secondary | ICD-10-CM

## 2018-09-07 DIAGNOSIS — I359 Nonrheumatic aortic valve disorder, unspecified: Secondary | ICD-10-CM

## 2018-09-07 LAB — POCT INR: INR: 2 (ref 2.0–3.0)

## 2018-09-07 NOTE — Patient Instructions (Signed)
Continue on same dosage 1 tablet every day except 1/2 tablet on Wednesdays.  Pt prefers 3 weeks.  Recheck INR in 3 weeks. Call our office if you have any problems or concerns [phone].

## 2018-09-28 ENCOUNTER — Ambulatory Visit (INDEPENDENT_AMBULATORY_CARE_PROVIDER_SITE_OTHER): Payer: Medicare Other

## 2018-09-28 DIAGNOSIS — Z952 Presence of prosthetic heart valve: Secondary | ICD-10-CM

## 2018-09-28 DIAGNOSIS — Z5181 Encounter for therapeutic drug level monitoring: Secondary | ICD-10-CM

## 2018-09-28 DIAGNOSIS — I359 Nonrheumatic aortic valve disorder, unspecified: Secondary | ICD-10-CM | POA: Diagnosis not present

## 2018-09-28 LAB — POCT INR: INR: 2.8 (ref 2.0–3.0)

## 2018-09-28 NOTE — Patient Instructions (Signed)
Please take 1/2 tablet tonight, then continue on same dosage 1 tablet every day except 1/2 tablet on Wednesdays.  Pt prefers 3 weeks.  Recheck INR in 3 weeks. Call our office if you have any problems or concerns [phone].

## 2018-09-29 DIAGNOSIS — E113293 Type 2 diabetes mellitus with mild nonproliferative diabetic retinopathy without macular edema, bilateral: Secondary | ICD-10-CM | POA: Diagnosis not present

## 2018-09-29 DIAGNOSIS — H353221 Exudative age-related macular degeneration, left eye, with active choroidal neovascularization: Secondary | ICD-10-CM | POA: Diagnosis not present

## 2018-09-29 DIAGNOSIS — H353112 Nonexudative age-related macular degeneration, right eye, intermediate dry stage: Secondary | ICD-10-CM | POA: Diagnosis not present

## 2018-09-29 DIAGNOSIS — H35371 Puckering of macula, right eye: Secondary | ICD-10-CM | POA: Diagnosis not present

## 2018-10-01 ENCOUNTER — Encounter (HOSPITAL_COMMUNITY): Payer: Self-pay | Admitting: Emergency Medicine

## 2018-10-01 ENCOUNTER — Other Ambulatory Visit: Payer: Self-pay

## 2018-10-01 ENCOUNTER — Emergency Department (HOSPITAL_COMMUNITY): Payer: Medicare Other

## 2018-10-01 ENCOUNTER — Observation Stay (HOSPITAL_COMMUNITY)
Admission: EM | Admit: 2018-10-01 | Discharge: 2018-10-02 | Disposition: A | Discharge status: Home / Self Care | Payer: Medicare Other | Attending: Internal Medicine | Admitting: Internal Medicine

## 2018-10-01 DIAGNOSIS — N3000 Acute cystitis without hematuria: Secondary | ICD-10-CM

## 2018-10-01 DIAGNOSIS — R410 Disorientation, unspecified: Secondary | ICD-10-CM | POA: Diagnosis not present

## 2018-10-01 DIAGNOSIS — E785 Hyperlipidemia, unspecified: Secondary | ICD-10-CM | POA: Insufficient documentation

## 2018-10-01 DIAGNOSIS — I491 Atrial premature depolarization: Secondary | ICD-10-CM | POA: Diagnosis not present

## 2018-10-01 DIAGNOSIS — I2510 Atherosclerotic heart disease of native coronary artery without angina pectoris: Secondary | ICD-10-CM | POA: Diagnosis not present

## 2018-10-01 DIAGNOSIS — I6782 Cerebral ischemia: Secondary | ICD-10-CM | POA: Diagnosis not present

## 2018-10-01 DIAGNOSIS — E162 Hypoglycemia, unspecified: Secondary | ICD-10-CM | POA: Diagnosis present

## 2018-10-01 DIAGNOSIS — N183 Chronic kidney disease, stage 3 (moderate): Secondary | ICD-10-CM | POA: Insufficient documentation

## 2018-10-01 DIAGNOSIS — I5042 Chronic combined systolic (congestive) and diastolic (congestive) heart failure: Secondary | ICD-10-CM | POA: Diagnosis not present

## 2018-10-01 DIAGNOSIS — I480 Paroxysmal atrial fibrillation: Secondary | ICD-10-CM | POA: Diagnosis not present

## 2018-10-01 DIAGNOSIS — J80 Acute respiratory distress syndrome: Secondary | ICD-10-CM | POA: Diagnosis not present

## 2018-10-01 DIAGNOSIS — R112 Nausea with vomiting, unspecified: Secondary | ICD-10-CM | POA: Diagnosis not present

## 2018-10-01 DIAGNOSIS — N390 Urinary tract infection, site not specified: Secondary | ICD-10-CM | POA: Diagnosis not present

## 2018-10-01 DIAGNOSIS — R58 Hemorrhage, not elsewhere classified: Secondary | ICD-10-CM | POA: Diagnosis not present

## 2018-10-01 DIAGNOSIS — K219 Gastro-esophageal reflux disease without esophagitis: Secondary | ICD-10-CM | POA: Diagnosis not present

## 2018-10-01 DIAGNOSIS — D62 Acute posthemorrhagic anemia: Secondary | ICD-10-CM | POA: Diagnosis not present

## 2018-10-01 DIAGNOSIS — Z881 Allergy status to other antibiotic agents status: Secondary | ICD-10-CM | POA: Insufficient documentation

## 2018-10-01 DIAGNOSIS — D649 Anemia, unspecified: Secondary | ICD-10-CM

## 2018-10-01 DIAGNOSIS — E1129 Type 2 diabetes mellitus with other diabetic kidney complication: Secondary | ICD-10-CM | POA: Diagnosis present

## 2018-10-01 DIAGNOSIS — I130 Hypertensive heart and chronic kidney disease with heart failure and stage 1 through stage 4 chronic kidney disease, or unspecified chronic kidney disease: Secondary | ICD-10-CM | POA: Insufficient documentation

## 2018-10-01 DIAGNOSIS — I252 Old myocardial infarction: Secondary | ICD-10-CM | POA: Insufficient documentation

## 2018-10-01 DIAGNOSIS — R55 Syncope and collapse: Secondary | ICD-10-CM | POA: Insufficient documentation

## 2018-10-01 DIAGNOSIS — Z79899 Other long term (current) drug therapy: Secondary | ICD-10-CM | POA: Insufficient documentation

## 2018-10-01 DIAGNOSIS — Z9889 Other specified postprocedural states: Secondary | ICD-10-CM | POA: Diagnosis not present

## 2018-10-01 DIAGNOSIS — Z7901 Long term (current) use of anticoagulants: Secondary | ICD-10-CM | POA: Insufficient documentation

## 2018-10-01 DIAGNOSIS — Z952 Presence of prosthetic heart valve: Secondary | ICD-10-CM | POA: Insufficient documentation

## 2018-10-01 DIAGNOSIS — Z951 Presence of aortocoronary bypass graft: Secondary | ICD-10-CM | POA: Insufficient documentation

## 2018-10-01 DIAGNOSIS — G319 Degenerative disease of nervous system, unspecified: Secondary | ICD-10-CM | POA: Insufficient documentation

## 2018-10-01 DIAGNOSIS — E161 Other hypoglycemia: Secondary | ICD-10-CM | POA: Diagnosis not present

## 2018-10-01 DIAGNOSIS — E1122 Type 2 diabetes mellitus with diabetic chronic kidney disease: Secondary | ICD-10-CM | POA: Insufficient documentation

## 2018-10-01 DIAGNOSIS — B9620 Unspecified Escherichia coli [E. coli] as the cause of diseases classified elsewhere: Secondary | ICD-10-CM | POA: Insufficient documentation

## 2018-10-01 DIAGNOSIS — E11649 Type 2 diabetes mellitus with hypoglycemia without coma: Secondary | ICD-10-CM | POA: Diagnosis not present

## 2018-10-01 DIAGNOSIS — Z888 Allergy status to other drugs, medicaments and biological substances status: Secondary | ICD-10-CM | POA: Insufficient documentation

## 2018-10-01 DIAGNOSIS — S0990XA Unspecified injury of head, initial encounter: Secondary | ICD-10-CM | POA: Diagnosis not present

## 2018-10-01 DIAGNOSIS — Z955 Presence of coronary angioplasty implant and graft: Secondary | ICD-10-CM | POA: Diagnosis not present

## 2018-10-01 DIAGNOSIS — Z880 Allergy status to penicillin: Secondary | ICD-10-CM | POA: Insufficient documentation

## 2018-10-01 DIAGNOSIS — Z882 Allergy status to sulfonamides status: Secondary | ICD-10-CM | POA: Insufficient documentation

## 2018-10-01 DIAGNOSIS — R0609 Other forms of dyspnea: Secondary | ICD-10-CM | POA: Diagnosis not present

## 2018-10-01 DIAGNOSIS — Z8249 Family history of ischemic heart disease and other diseases of the circulatory system: Secondary | ICD-10-CM | POA: Insufficient documentation

## 2018-10-01 DIAGNOSIS — Z7982 Long term (current) use of aspirin: Secondary | ICD-10-CM | POA: Insufficient documentation

## 2018-10-01 DIAGNOSIS — I447 Left bundle-branch block, unspecified: Secondary | ICD-10-CM | POA: Insufficient documentation

## 2018-10-01 DIAGNOSIS — N184 Chronic kidney disease, stage 4 (severe): Secondary | ICD-10-CM | POA: Diagnosis present

## 2018-10-01 DIAGNOSIS — I5032 Chronic diastolic (congestive) heart failure: Secondary | ICD-10-CM | POA: Diagnosis present

## 2018-10-01 DIAGNOSIS — R197 Diarrhea, unspecified: Secondary | ICD-10-CM | POA: Insufficient documentation

## 2018-10-01 DIAGNOSIS — I10 Essential (primary) hypertension: Secondary | ICD-10-CM | POA: Diagnosis present

## 2018-10-01 DIAGNOSIS — W19XXXA Unspecified fall, initial encounter: Secondary | ICD-10-CM | POA: Diagnosis not present

## 2018-10-01 DIAGNOSIS — Z7984 Long term (current) use of oral hypoglycemic drugs: Secondary | ICD-10-CM | POA: Insufficient documentation

## 2018-10-01 HISTORY — DX: Cardiac arrhythmia, unspecified: I49.9

## 2018-10-01 LAB — CBC WITH DIFFERENTIAL/PLATELET
Abs Immature Granulocytes: 0.03 10*3/uL (ref 0.00–0.07)
Basophils Absolute: 0 10*3/uL (ref 0.0–0.1)
Basophils Relative: 0 %
Eosinophils Absolute: 0 10*3/uL (ref 0.0–0.5)
Eosinophils Relative: 0 %
HCT: 35.9 % — ABNORMAL LOW (ref 36.0–46.0)
HEMOGLOBIN: 11.4 g/dL — AB (ref 12.0–15.0)
Immature Granulocytes: 0 %
LYMPHS ABS: 0.7 10*3/uL (ref 0.7–4.0)
LYMPHS PCT: 10 %
MCH: 29.9 pg (ref 26.0–34.0)
MCHC: 31.8 g/dL (ref 30.0–36.0)
MCV: 94.2 fL (ref 80.0–100.0)
Monocytes Absolute: 0.8 10*3/uL (ref 0.1–1.0)
Monocytes Relative: 11 %
NEUTROS ABS: 6 10*3/uL (ref 1.7–7.7)
Neutrophils Relative %: 79 %
Platelets: 244 10*3/uL (ref 150–400)
RBC: 3.81 MIL/uL — ABNORMAL LOW (ref 3.87–5.11)
RDW: 13.7 % (ref 11.5–15.5)
WBC: 7.6 10*3/uL (ref 4.0–10.5)
nRBC: 0 % (ref 0.0–0.2)

## 2018-10-01 LAB — COMPREHENSIVE METABOLIC PANEL
ALT: 27 U/L (ref 0–44)
AST: 36 U/L (ref 15–41)
Albumin: 3.8 g/dL (ref 3.5–5.0)
Alkaline Phosphatase: 41 U/L (ref 38–126)
Anion gap: 12 (ref 5–15)
BUN: 29 mg/dL — ABNORMAL HIGH (ref 8–23)
CO2: 21 mmol/L — AB (ref 22–32)
Calcium: 8.7 mg/dL — ABNORMAL LOW (ref 8.9–10.3)
Chloride: 101 mmol/L (ref 98–111)
Creatinine, Ser: 1.49 mg/dL — ABNORMAL HIGH (ref 0.44–1.00)
GFR calc Af Amer: 37 mL/min — ABNORMAL LOW (ref >60)
GFR calc non Af Amer: 32 mL/min — ABNORMAL LOW (ref >60)
GLUCOSE: 106 mg/dL — AB (ref 70–99)
Potassium: 4 mmol/L (ref 3.5–5.1)
SODIUM: 134 mmol/L — AB (ref 135–145)
Total Bilirubin: 1.5 mg/dL — ABNORMAL HIGH (ref 0.3–1.2)
Total Protein: 6.9 g/dL (ref 6.5–8.1)

## 2018-10-01 LAB — URINALYSIS, ROUTINE W REFLEX MICROSCOPIC
Bilirubin Urine: NEGATIVE
GLUCOSE, UA: 150 mg/dL — AB
Hgb urine dipstick: NEGATIVE
Ketones, ur: NEGATIVE mg/dL
Nitrite: POSITIVE — AB
Protein, ur: NEGATIVE mg/dL
Specific Gravity, Urine: 1.009 (ref 1.005–1.030)
pH: 5 (ref 5.0–8.0)

## 2018-10-01 LAB — DIGOXIN LEVEL: Digoxin Level: 0.5 ng/mL — ABNORMAL LOW (ref 0.8–2.0)

## 2018-10-01 LAB — BRAIN NATRIURETIC PEPTIDE: B Natriuretic Peptide: 150 pg/mL — ABNORMAL HIGH (ref 0.0–100.0)

## 2018-10-01 LAB — CBG MONITORING, ED
GLUCOSE-CAPILLARY: 27 mg/dL — AB (ref 70–99)
GLUCOSE-CAPILLARY: 76 mg/dL (ref 70–99)
Glucose-Capillary: 134 mg/dL — ABNORMAL HIGH (ref 70–99)
Glucose-Capillary: 65 mg/dL — ABNORMAL LOW (ref 70–99)
Glucose-Capillary: 85 mg/dL (ref 70–99)

## 2018-10-01 LAB — LIPASE, BLOOD: Lipase: 28 U/L (ref 11–51)

## 2018-10-01 LAB — TYPE AND SCREEN
ABO/RH(D): O NEG
Antibody Screen: NEGATIVE

## 2018-10-01 LAB — GLUCOSE, CAPILLARY: Glucose-Capillary: 73 mg/dL (ref 70–99)

## 2018-10-01 LAB — PROTIME-INR
INR: 1.71
Prothrombin Time: 19.9 seconds — ABNORMAL HIGH (ref 11.4–15.2)

## 2018-10-01 LAB — I-STAT CG4 LACTIC ACID, ED
LACTIC ACID, VENOUS: 1.2 mmol/L (ref 0.5–1.9)
Lactic Acid, Venous: 2.21 mmol/L (ref 0.5–1.9)

## 2018-10-01 LAB — MRSA PCR SCREENING: MRSA by PCR: NEGATIVE

## 2018-10-01 LAB — CK: Total CK: 245 U/L — ABNORMAL HIGH (ref 38–234)

## 2018-10-01 MED ORDER — DIGOXIN 125 MCG PO TABS
0.1250 mg | ORAL_TABLET | Freq: Every day | ORAL | Status: DC
  Administered 2018-10-01 – 2018-10-02 (×2): 0.125 mg via ORAL
  Filled 2018-10-01 (×2): qty 1

## 2018-10-01 MED ORDER — EZETIMIBE 10 MG PO TABS
10.0000 mg | ORAL_TABLET | Freq: Every day | ORAL | Status: DC
  Administered 2018-10-01: 10 mg via ORAL
  Filled 2018-10-01 (×2): qty 1

## 2018-10-01 MED ORDER — HYDRALAZINE HCL 20 MG/ML IJ SOLN: 5.0000 mg | INTRAMUSCULAR | Status: DC | PRN

## 2018-10-01 MED ORDER — DEXTROSE 50 % IV SOLN
INTRAVENOUS | Status: AC
  Administered 2018-10-01: 15:00:00
  Filled 2018-10-01: qty 50

## 2018-10-01 MED ORDER — ACETAMINOPHEN 325 MG PO TABS: 650.0000 mg | ORAL_TABLET | Freq: Four times a day (QID) | ORAL | Status: DC | PRN

## 2018-10-01 MED ORDER — SODIUM CHLORIDE 0.9 % IV SOLN
1.0000 g | INTRAVENOUS | Status: DC
  Administered 2018-10-02: 1 g via INTRAVENOUS
  Filled 2018-10-01: qty 10

## 2018-10-01 MED ORDER — WARFARIN - PHARMACIST DOSING INPATIENT: Freq: Every day | Status: DC

## 2018-10-01 MED ORDER — ONDANSETRON HCL 4 MG PO TABS: 4.0000 mg | ORAL_TABLET | Freq: Four times a day (QID) | ORAL | Status: DC | PRN

## 2018-10-01 MED ORDER — ATORVASTATIN CALCIUM 80 MG PO TABS
80.0000 mg | ORAL_TABLET | Freq: Every day | ORAL | Status: DC
  Administered 2018-10-01 – 2018-10-02 (×2): 80 mg via ORAL
  Filled 2018-10-01 (×2): qty 1

## 2018-10-01 MED ORDER — SODIUM CHLORIDE 0.9 % IV BOLUS
1000.0000 mL | Freq: Once | INTRAVENOUS | Status: AC
  Administered 2018-10-01: 1000 mL via INTRAVENOUS

## 2018-10-01 MED ORDER — ZOLPIDEM TARTRATE 5 MG PO TABS: 5.0000 mg | ORAL_TABLET | Freq: Every evening | ORAL | Status: DC | PRN

## 2018-10-01 MED ORDER — PROSIGHT PO TABS
1.0000 | ORAL_TABLET | Freq: Every day | ORAL | Status: DC
  Administered 2018-10-02: 1 via ORAL
  Filled 2018-10-01 (×2): qty 1

## 2018-10-01 MED ORDER — FOLIC ACID 0.5 MG HALF TAB
500.0000 ug | ORAL_TABLET | Freq: Every evening | ORAL | Status: DC
  Administered 2018-10-01: 0.5 mg via ORAL
  Filled 2018-10-01 (×2): qty 1

## 2018-10-01 MED ORDER — METOPROLOL TARTRATE 12.5 MG HALF TABLET
12.5000 mg | ORAL_TABLET | Freq: Two times a day (BID) | ORAL | Status: DC
  Administered 2018-10-01 – 2018-10-02 (×2): 12.5 mg via ORAL
  Filled 2018-10-01 (×2): qty 1

## 2018-10-01 MED ORDER — LOSARTAN POTASSIUM 50 MG PO TABS
50.0000 mg | ORAL_TABLET | Freq: Every day | ORAL | Status: DC
  Administered 2018-10-01 – 2018-10-02 (×2): 50 mg via ORAL
  Filled 2018-10-01 (×2): qty 1

## 2018-10-01 MED ORDER — DEXTROSE 50 % IV SOLN: 50.0000 mL | INTRAVENOUS | Status: DC | PRN

## 2018-10-01 MED ORDER — ISOSORBIDE MONONITRATE ER 60 MG PO TB24
60.0000 mg | ORAL_TABLET | Freq: Every day | ORAL | Status: DC
  Administered 2018-10-01 – 2018-10-02 (×2): 60 mg via ORAL
  Filled 2018-10-01 (×2): qty 1

## 2018-10-01 MED ORDER — PANTOPRAZOLE SODIUM 40 MG PO TBEC
40.0000 mg | DELAYED_RELEASE_TABLET | Freq: Every day | ORAL | Status: DC
  Administered 2018-10-01 – 2018-10-02 (×2): 40 mg via ORAL
  Filled 2018-10-01 (×2): qty 1

## 2018-10-01 MED ORDER — WARFARIN SODIUM 6 MG PO TABS
6.0000 mg | ORAL_TABLET | Freq: Once | ORAL | Status: AC
  Administered 2018-10-01: 6 mg via ORAL
  Filled 2018-10-01 (×2): qty 1

## 2018-10-01 MED ORDER — OMEGA-3-ACID ETHYL ESTERS 1 G PO CAPS
1.0000 | ORAL_CAPSULE | Freq: Two times a day (BID) | ORAL | Status: DC
  Administered 2018-10-02: 1 g via ORAL
  Filled 2018-10-01 (×2): qty 1

## 2018-10-01 MED ORDER — CEPHALEXIN 500 MG PO CAPS: 500.0000 mg | ORAL_CAPSULE | Freq: Two times a day (BID) | ORAL | 0 refills | Status: DC

## 2018-10-01 MED ORDER — LACTATED RINGERS IV BOLUS: 1000.0000 mL | Freq: Once | INTRAVENOUS | Status: DC

## 2018-10-01 MED ORDER — ONDANSETRON HCL 4 MG/2ML IJ SOLN: 4.0000 mg | Freq: Four times a day (QID) | INTRAMUSCULAR | Status: DC | PRN

## 2018-10-01 MED ORDER — NITROGLYCERIN 0.4 MG SL SUBL: 0.4000 mg | SUBLINGUAL_TABLET | SUBLINGUAL | Status: DC | PRN

## 2018-10-01 MED ORDER — SE-TAN PLUS 162-115.2-1 MG PO CAPS: 1.0000 | ORAL_CAPSULE | Freq: Two times a day (BID) | ORAL | Status: DC

## 2018-10-01 MED ORDER — ADULT MULTIVITAMIN W/MINERALS CH
1.0000 | ORAL_TABLET | Freq: Every day | ORAL | Status: DC
  Administered 2018-10-02: 1 via ORAL
  Filled 2018-10-01 (×2): qty 1

## 2018-10-01 MED ORDER — SODIUM CHLORIDE 0.9 % IV SOLN
1.0000 g | Freq: Once | INTRAVENOUS | Status: AC
  Administered 2018-10-01: 1 g via INTRAVENOUS
  Filled 2018-10-01: qty 10

## 2018-10-01 MED ORDER — DEXTROSE 10 % IV SOLN
INTRAVENOUS | Status: DC
  Administered 2018-10-01: 19:00:00 via INTRAVENOUS

## 2018-10-01 MED ORDER — ACETAMINOPHEN 650 MG RE SUPP: 650.0000 mg | Freq: Four times a day (QID) | RECTAL | Status: DC | PRN

## 2018-10-01 MED ORDER — DEXTROSE 10 % IV SOLN
INTRAVENOUS | Status: DC
  Administered 2018-10-02: 07:00:00 via INTRAVENOUS

## 2018-10-01 MED ORDER — ASPIRIN EC 81 MG PO TBEC
81.0000 mg | DELAYED_RELEASE_TABLET | Freq: Every day | ORAL | Status: DC
  Administered 2018-10-01 – 2018-10-02 (×2): 81 mg via ORAL
  Filled 2018-10-01 (×2): qty 1

## 2018-10-01 NOTE — ED Notes (Signed)
cbg 109

## 2018-10-01 NOTE — ED Notes (Signed)
BS noted to be 76

## 2018-10-01 NOTE — ED Notes (Signed)
Urine culture added on and sent to Main Lab.

## 2018-10-01 NOTE — ED Provider Notes (Signed)
  Physical Exam  BP (!) 155/55   Pulse 97   Temp 98.3 F (36.8 C) (Rectal)   Resp (!) 24   SpO2 98%   Physical Exam  ED Course/Procedures     Procedures  MDM  Care assumed at 4 PM from Dr. Rex Kras.  Patient had gastroenteritis symptoms 2 days ago that resolved.  She is here with diffuse weakness.  Patient was found to be hypoglycemic per EMS and was given D50.  Around 3 PM, patient dropped her blood sugar down to 27 again and given another amp of D50.  Patient was also given some food and orange juice and signout was reassessed around 6 PM.  7:31 PM CBG dropped to 65 around 5:45 pm. Started on D10 drip. Patient is on oral zetia and glimepiride but didn't take them today. I think hypoglycemia likely from dehydration. Will admit for observation for persistent hypoglycemia   CRITICAL CARE Performed by: Wandra Arthurs   Total critical care time: 30 minutes  Critical care time was exclusive of separately billable procedures and treating other patients.  Critical care was necessary to treat or prevent imminent or life-threatening deterioration.  Critical care was time spent personally by me on the following activities: development of treatment plan with patient and/or surrogate as well as nursing, discussions with consultants, evaluation of patient's response to treatment, examination of patient, obtaining history from patient or surrogate, ordering and performing treatments and interventions, ordering and review of laboratory studies, ordering and review of radiographic studies, pulse oximetry and re-evaluation of patient's condition.        Drenda Freeze, MD 10/01/18 [phone]

## 2018-10-01 NOTE — ED Triage Notes (Signed)
Pt to ER for evaluation of nausea, vomiting, and diarrhea onset since Wednesday. [place] going to the bathroom at 2 AM this morning, no injury, no syncope, no complaints of pain. EMS reports abdominal distention. Hx of DM, reports CBG was 65 so was given 5 g D10.

## 2018-10-01 NOTE — ED Notes (Signed)
CBG back down to 65 despite eating/drinking. Dr Darl Householder aware.

## 2018-10-01 NOTE — ED Notes (Signed)
Pt placed on bedpan

## 2018-10-01 NOTE — ED Notes (Signed)
Patient transported to X-ray 

## 2018-10-01 NOTE — ED Notes (Signed)
Pt explaining she doesn't recall falling this morning. Hx of GI bleed. Reports diarrhea has been dark.

## 2018-10-01 NOTE — Progress Notes (Signed)
ANTICOAGULATION CONSULT NOTE - Initial Consult  Pharmacy Consult for warfarin Indication: atrial fibrillation  Allergies  Allergen Reactions  . Darvon [Propoxyphene] Nausea And Vomiting  . Lisinopril Cough  . Septra [Sulfamethoxazole-Trimethoprim] Other (See Comments)    Increased INR  . Warfarin And Related Other (See Comments)    ONLY TOLERATES BRAND  . Penicillins Rash    Patient Measurements:   Heparin Dosing Weight: n/a   Vital Signs: Temp: 98.3 F (36.8 C) (01/10 1026) Temp Source: Rectal (01/10 1026) BP: 155/55 (01/10 1900) Pulse Rate: 97 (01/10 1900)  Labs: Recent Labs    10/01/18 1043  HGB 11.4*  HCT 35.9*  PLT 244  LABPROT 19.9*  INR 1.71  CREATININE 1.49*    CrCl cannot be calculated (Unknown ideal weight.).   Medical History: Past Medical History:  Diagnosis Date  . Anemia   . Anginal pain ([place])   . Arteriovenous malformation of gastrointestinal tract   . Arthritis    "fingers mostly" (04/12/2015)  . Carotid artery disease ([place])    Carotid US 3/17:  70-17% RICA; 79-39% LICA; Elevated bilateral subclavian artery velocities >>f/u 1 year. // Carotid US 4/18: R 40-59; L 1-39 >> FU 1 year  . CHF (congestive heart failure) ([place])   . Chronic kidney disease (CKD), stage III (moderate) (HCC)   . Chronic lower GI bleeding    "today; last time was ~ 8 yr ago; used to have them often before that too" (02/11/2013)  . Coronary artery disease   . DJD (degenerative joint disease)   . Heart murmur   . History of blood transfusion    "a few times over the years; usually related to my Coumadin" (04/12/2015  . History of gout   . Hyperlipemia   . Hypertension   . Macula lutea degeneration   . Old MI (myocardial infarction)    "a coulple /dr in 02/2008; I never even knew I'd had them" (04/12/2015)  . Type II diabetes mellitus (HCC)     Medications:  (Not in a hospital admission)   Assessment: 45 YOF with N/V/D. Pt is on warfarin at home for h/o Afib. INR  on admission is subtherapeutic at 1.71. OF note, pt has not eaten or drank well in the past several day. H/H low. Plt wnl. Last dose of warfarin was yesterday  Home warfarin dose: 2.5 mg on Wed; 5 mg on all other day   Goal of Therapy:  INR 2-3 Monitor platelets by anticoagulation protocol: Yes   Plan:  -Warfarin 5 mg x 1 dose (patients uses brand name)  -Daily PT/INR   Albertina Parr, PharmD., BCPS Clinical Pharmacist Clinical phone for 10/01/18 until 10:30pm: [phone] If after 10:30pm, please refer to [facility] for unit-specific pharmacist

## 2018-10-01 NOTE — ED Notes (Signed)
Pt given cup of OJ for PO challenge. EDP aware.

## 2018-10-01 NOTE — ED Notes (Signed)
While preparing patient for discharge, patient stood up, became glazed over and nearly had syncopal episode. Placed back in bed. MD notified. BP 70's at that time. CBG 27, D50 given.

## 2018-10-01 NOTE — ED Provider Notes (Signed)
[facility] EMERGENCY DEPARTMENT Provider Note   CSN: [id number] Arrival date & time: 10/01/18  1012     History   Chief Complaint Chief Complaint  Patient presents with  . Nausea  . Emesis  . Diarrhea  . Near Syncope    HPI [name] is a 85 y.o. female.  85yo F w/ PMH incluidng CAD, CHF, T2DM, valve replacement on coumadin, chronic lower GI bleeding, who p/w vomiting, diarrhea, fall, and disorientation. 2 days ago she had 2 episodes of vomiting followed by several episodes of diarrhea. Diarrhea was dark in color. Yesterday she took an immodium and the diarrhea stopped. She was a [name] disoriented last night when she went to bed. Around 2am, her husband heard a noise and found her on the floor. She does not remember the fall. This morning at [place] husband found her trying to change the alarm clock. she has been generally weak and disoriented. She denies any chest or abd pain. She reports SOB w/ exertion that is mildly worse recently. No urinary symptoms, fevers, or change in medication. She does have h/o GI bleed.   Emesis  Associated symptoms: diarrhea   Diarrhea  Associated symptoms: vomiting   Near Syncope     Past Medical History:  Diagnosis Date  . Anemia   . Anginal pain ([place])   . Arteriovenous malformation of gastrointestinal tract   . Arthritis    "fingers mostly" (04/12/2015)  . Carotid artery disease ([place])    Carotid US 3/17:  09-32% RICA; 35-57% LICA; Elevated bilateral subclavian artery velocities >>f/u 1 year. // Carotid US 4/18: R 40-59; L 1-39 >> FU 1 year  . CHF (congestive heart failure) ([place])   . Chronic kidney disease (CKD), stage III (moderate) (HCC)   . Chronic lower GI bleeding    "today; last time was ~ 8 yr ago; used to have them often before that too" (02/11/2013)  . Coronary artery disease   . DJD (degenerative joint disease)   . Heart murmur   . History of blood transfusion    "a few times over the years; usually  related to my Coumadin" (04/12/2015  . History of gout   . Hyperlipemia   . Hypertension   . Macula lutea degeneration   . Old MI (myocardial infarction)    "a coulple /dr in 02/2008; I never even knew I'd had them" (04/12/2015)  . Type II diabetes mellitus [facility])     Patient Active Problem List   Diagnosis Date Noted  . Hematochezia   . AVM (arteriovenous malformation) of small bowel, acquired   . Hemorrhoids, internal, with bleeding   . Acute kidney injury ([place])   . Anticoagulated   . Troponin level elevated   . Acute lower GI bleeding/known history AVMs 04/15/2015  . HTN (hypertension) 04/15/2015  . Hyperlipidemia 04/15/2015  . Elevated troponin 04/15/2015  . Acute renal failure superimposed on stage 3 chronic kidney disease ([place]) 04/15/2015  . PAF (paroxysmal atrial fibrillation) ([place]) 04/15/2015  . GI bleed 04/15/2015  . Carotid stenosis 11/28/2013  . Encounter for therapeutic drug monitoring 11/01/2013  . Aortic valve disorder 07/12/2013  . Heart valve replaced by other means 07/12/2013  . Normocytic anemia 02/11/2013  . Chronic anticoagulation 02/11/2013  . Acute blood loss anemia 02/11/2013  . S/P AVR (aortic valve replacement) 02/11/2013  . Coronary Artery Disease 02/11/2013  . DM (diabetes mellitus) ([place]) 02/11/2013    Past Surgical History:  Procedure Laterality Date  . AORTIC  VALVE REPLACEMENT  1993  . APPENDECTOMY  1953  . CARDIAC CATHETERIZATION  ~1990  . CARDIAC CATHETERIZATION  04/12/2015   Procedure: Coronary Stent Intervention;  Surgeon: Jettie Booze, MD;    SYNERGY DES 3.5X16 to the ostial RCA   . CARDIAC CATHETERIZATION  04/12/2015   Procedure: Coronary/Graft Angiography;  Surgeon: Eloy End, MD; LAD & CFX 100%, patent LIMA-LAD, SVG-D1; SVG-OM-PDA first limb 100%, 2nd limb patent; oRCA 75%>0 w/ stent  . [place]   St. Jude/notes 10/29/2003 (02/11/2013)  . COLONOSCOPY N/A 02/13/2013   Procedure: COLONOSCOPY;  Surgeon:  Lafayette Dragon, MD;  Location: [facility] ENDOSCOPY;  Service: Endoscopy;  Laterality: N/A;  . CORONARY ANGIOPLASTY WITH STENT PLACEMENT  2009+   "3 at least; put in 1 stent at a time" (02/11/2013)  . CORONARY ARTERY BYPASS GRAFT  1990   LIMA-LAD, SVG-OM-PDA, SVG-D1  . DILATION AND CURETTAGE OF UTERUS  1960's   'after a miscarriage" (02/11/2013)  . ENTEROSCOPY N/A 02/13/2013   Procedure: ENTEROSCOPY;  Surgeon: Lafayette Dragon, MD;  Location: [facility] ENDOSCOPY;  Service: Endoscopy;  Laterality: N/A;  . EXPLORATORY LAPAROTOMY  02/24/2008   which revealed a retroperitoneal hematoma and bleeding from the right external iliac artery/notes 03/02/2008 (02/11/2013)   . FLEXIBLE SIGMOIDOSCOPY N/A 04/21/2015   Procedure: FLEXIBLE SIGMOIDOSCOPY;  Surgeon: Ladene Artist, MD;  Location: [facility] ENDOSCOPY;  Service: Endoscopy;  Laterality: N/A;     OB History   No obstetric history on file.      Home Medications    Prior to Admission medications   Medication Sig Start Date End Date Taking? Authorizing Provider  aspirin EC 81 MG tablet Take 1 tablet (81 mg total) by mouth daily. 04/17/16   Jettie Booze, MD  atorvastatin (LIPITOR) 80 MG tablet Take 1 tablet (80 mg total) by mouth daily. 11/17/17   Jettie Booze, MD  bevacizumab (AVASTIN) 1.25 mg/0.1 mL SOLN Place 1.25 mg into the left eye every 8 (eight) weeks.    [provider]  Canagliflozin (INVOKANA) 100 MG TABS Take 100 mg by mouth daily.     [provider]  cephALEXin (KEFLEX) 500 MG capsule Take 1 capsule (500 mg total) by mouth 2 (two) times daily for 7 days. 10/01/18 10/08/18  Armin Yerger, Wenda Overland, MD  COUMADIN 5 MG tablet USE AS DIRECTED BY  COUMADIN  CLINIC 05/31/18   Jettie Booze, MD  digoxin (LANOXIN) 0.125 MG tablet Take 0.125 mg by mouth daily.    [provider]  ezetimibe (ZETIA) 10 MG tablet TAKE 1 TABLET BY MOUTH ONCE DAILY 02/16/18   Jettie Booze, MD  FeFum-FePo-FA-B Cmp-C-Zn-Mn-Cu (SE-TAN PLUS)  162-115.2-1 MG CAPS Take 1 tablet by mouth 2 (two) times daily. 05/22/15   [provider]  folic acid (FOLVITE) 409 MCG tablet Take 400 mcg by mouth every evening.     [provider]  furosemide (LASIX) 40 MG tablet Take 1 tablet (40 mg total) by mouth daily. 05/03/15   Richardson Dopp T, PA-C  glimepiride (AMARYL) 4 MG tablet Take 4 mg by mouth daily before breakfast. 03/24/15   [provider]  isosorbide mononitrate (IMDUR) 60 MG 24 hr tablet TAKE 1 TABLET BY MOUTH ONCE DAILY 01/26/18   Jettie Booze, MD  JANUVIA 50 MG tablet Take 50 mg by mouth daily.  11/26/13   [provider]  losartan (COZAAR) 50 MG tablet Take 50 mg by mouth daily.    [provider]  metoprolol tartrate (LOPRESSOR) 25 MG tablet Take 0.5 tablets (12.5 mg total) by mouth 2 (two) times daily. 05/03/15   Kathlen Mody, Scott T, PA-C  Mometasone Furoate [facility] HFA) 100 MCG/ACT AERO Inhale 1 puff into the lungs 2 (two) times daily. 08/24/18   Garner Nash, DO  Multiple Vitamin (MULTIVITAMIN WITH MINERALS) TABS Take 1 tablet by mouth daily.    [provider]  nitroGLYCERIN (NITROSTAT) 0.4 MG SL tablet Place 1 tablet (0.4 mg total) under the tongue every 5 (five) minutes as needed for chest pain. Please make appt for January. 1st attempt 09/07/17   Jettie Booze, MD  Omega-3 Fatty Acids 1200 MG CAPS Take 1 capsule by mouth 2 (two) times daily.     [provider]  omeprazole (PRILOSEC) 20 MG capsule Take 1 capsule (20 mg total) by mouth daily. 07/06/18   Icard, Leory Plowman L, DO  potassium chloride (K-DUR,KLOR-CON) 10 MEQ tablet Take 20 mEq by mouth 2 (two) times daily.     [provider]    Family History Family History  Problem Relation Age of Onset  . Heart disease Mother   . Heart disease Father     Social History Social History   Tobacco Use  . Smoking status: Never Smoker  . Smokeless tobacco: Never Used  Substance Use Topics  . Alcohol  use: No  . Drug use: No     Allergies   Darvon [propoxyphene]; Lisinopril; Septra [sulfamethoxazole-trimethoprim]; Warfarin and related; and Penicillins   Review of Systems Review of Systems  Cardiovascular: Positive for near-syncope.  Gastrointestinal: Positive for diarrhea and vomiting.   All other systems reviewed and are negative except that which was mentioned in HPI   Physical Exam Updated Vital Signs BP (!) 122/50 (BP Location: Right Arm)   Pulse 82   Temp 98.3 F (36.8 C) (Rectal)   Resp 20   SpO2 96%   Physical Exam Vitals signs and nursing note reviewed.  Constitutional:      General: She is not in acute distress.    Appearance: She is well-developed.     Comments: Awake, alert  HENT:     Head: Normocephalic and atraumatic.  Eyes:     Extraocular Movements: Extraocular movements intact.     Conjunctiva/sclera: Conjunctivae normal.     Pupils: Pupils are equal, round, and reactive to light.  Neck:     Musculoskeletal: Neck supple.  Cardiovascular:     Rate and Rhythm: Normal rate and regular rhythm.     Heart sounds: Murmur present.  Pulmonary:     Effort: Pulmonary effort is normal. No respiratory distress.     Breath sounds: Normal breath sounds.  Chest:     Chest wall: Tenderness (mild L posterior lower rib, no crepitus) present.  Abdominal:     General: Bowel sounds are normal. There is no distension.     Palpations: Abdomen is soft.     Tenderness: There is no abdominal tenderness.  Skin:    General: Skin is warm and dry.  Neurological:     Mental Status: She is alert and oriented to person, place, and time.     Cranial Nerves: No cranial nerve deficit.     Motor: No abnormal muscle tone.     Deep Tendon Reflexes: Reflexes are normal and symmetric.     Comments: Fluent speech, normal finger-to-nose testing, negative pronator drift, no clonus 5/5 strength and normal sensation x all 4 extremities  Psychiatric:  Thought Content: Thought  content normal.        Judgment: Judgment normal.      ED Treatments / Results  Labs (all labs ordered are listed, but only abnormal results are displayed) Labs Reviewed  COMPREHENSIVE METABOLIC PANEL - Abnormal; Notable for the following components:      Result Value   Sodium 134 (*)    CO2 21 (*)    Glucose, Bld 106 (*)    BUN 29 (*)    Creatinine, Ser 1.49 (*)    Calcium 8.7 (*)    Total Bilirubin 1.5 (*)    GFR calc non Af Amer 32 (*)    GFR calc Af Amer 37 (*)    All other components within normal limits  CBC WITH DIFFERENTIAL/PLATELET - Abnormal; Notable for the following components:   RBC 3.81 (*)    Hemoglobin 11.4 (*)    HCT 35.9 (*)    All other components within normal limits  PROTIME-INR - Abnormal; Notable for the following components:   Prothrombin Time 19.9 (*)    All other components within normal limits  URINALYSIS, ROUTINE W REFLEX MICROSCOPIC - Abnormal; Notable for the following components:   APPearance HAZY (*)    Glucose, UA 150 (*)    Nitrite POSITIVE (*)    Leukocytes, UA SMALL (*)    Bacteria, UA MANY (*)    All other components within normal limits  I-STAT CG4 LACTIC ACID, ED - Abnormal; Notable for the following components:   Lactic Acid, Venous 2.21 (*)    All other components within normal limits  URINE CULTURE  LIPASE, BLOOD  I-STAT CG4 LACTIC ACID, ED  TYPE AND SCREEN    EKG EKG Interpretation  Date/Time:  Friday October 01 2018 10:18:08 EST Ventricular Rate:  85 PR Interval:    QRS Duration: 127 QT Interval:  374 QTC Calculation: 445 R Axis:   -50 Text Interpretation:  Ectopic atrial rhythm Left bundle branch block now sinus rhythm from A fib Confirmed by Theotis Burrow [id number]) on 10/01/2018 10:21:16 AM   Radiology Dg Chest 2 View  Result Date: 10/01/2018 CLINICAL DATA:  Shortness of breath on exertion EXAM: CHEST - 2 VIEW COMPARISON:  06/01/2018 FINDINGS: Cardiac shadow is stable. Postsurgical changes are again noted. The  lungs are well aerated bilaterally. No focal infiltrate or sizable effusion is seen. No acute bony abnormality is noted. IMPRESSION: No acute abnormality noted. Electronically Signed   By: Inez Catalina M.D.   On: 10/01/2018 11:19   Ct Head Wo Contrast  Result Date: 10/01/2018 CLINICAL DATA:  Head trauma.  Fall going to bathroom.  The EXAM: CT HEAD WITHOUT CONTRAST TECHNIQUE: Contiguous axial images were obtained from the base of the skull through the vertex without intravenous contrast. COMPARISON:  None FINDINGS: Brain: No evidence of acute infarction, hemorrhage, hydrocephalus, extra-axial collection or mass lesion/mass effect. Chronic infarct involving the left posterior parietal lobe identified, image 22/3. There is mild diffuse low-attenuation within the subcortical and periventricular white matter compatible with chronic microvascular disease. Prominence of the sulci and ventricles compatible with brain atrophy. Vascular: No hyperdense vessel or unexpected calcification. Skull: Normal. Negative for fracture or focal lesion. Sinuses/Orbits: No acute finding. Other: None. IMPRESSION: 1. No acute intracranial abnormalities. 2. Chronic small vessel ischemic disease and brain atrophy. 3. Chronic left posterior parietal infarct. Electronically Signed   By: Kerby Moors M.D.   On: 10/01/2018 11:49    Procedures Procedures (including critical care time)  Medications Ordered  in ED Medications  sodium chloride 0.9 % bolus 1,000 mL (0 mLs Intravenous Stopped 10/01/18 1240)  cefTRIAXone (ROCEPHIN) 1 g in sodium chloride 0.9 % 100 mL IVPB (0 g Intravenous Stopped 10/01/18 1354)     Initial Impression / Assessment and Plan / ED Course  I have reviewed the triage vital signs and the nursing notes.  Pertinent labs & imaging results that were available during my care of the patient were reviewed by me and considered in my medical decision making (see chart for details).    Pt was alert, oriented,  following commands on exam. VS reassuring. No abd tenderness. Labs show lactate 2.21, Cr similar to previous, Hgb 11.4 similar to previous, normal WBC count, INR 1.71. CXR clear. Lactate normalized after IVF. UA shows +nitrites, many bacteria. U culture sent, gave CTX. This infection may account for some of her disorientation overnight. Head CT is negative.  On reassessment she is well-appearing, oriented, and denying complaints.  Family states that she has been in her normal state of health during her ED course and has demonstrated no confusion here.  I have discussed outpatient treatment with Keflex and continued hydration at home.  I have extensively reviewed return precautions with family and they have voiced understanding.  Final Clinical Impressions(s) / ED Diagnoses   Final diagnoses:  Urinary tract infection without hematuria, site unspecified  Transient disorientation    ED Discharge Orders         Ordered    cephALEXin (KEFLEX) 500 MG capsule  2 times daily     10/01/18 1455           Jaylie Neaves, Wenda Overland, MD 10/01/18 1458

## 2018-10-01 NOTE — H&P (Signed)
History and Physical    [name] LOV:[id number] DOB: May 07, 1933 DOA: 10/01/2018  Referring MD/NP/PA:   PCP: Gaynelle Arabian, MD   Patient coming from:  The patient is coming from home.  At baseline, pt is independent for most of ADL.        Chief Complaint: Nausea, vomiting, diarrhea, fall, syncope, burning on urination hypoglycemia   HPI: [name] is a 85 y.o. female with medical history significant of hypertension, hyperlipidemia, diabetes mellitus, GERD, gout, CKD stage III, anemia, GI bleeding, CHF, CKD 3, CAD, stent placement, CABG, aortic valve replacement with a prosthetic valve, atrial fibrillation on Coumadin, who presents with nausea, vomiting, diarrhea, fall, syncope, burning on urination hypoglycemia.  Patient states that she started having nausea vomiting, diarrhea since Wednesday.  Initially she vomited 2 or 3 times with nonbilious and nonbloody vomitus.  She also has several episodes of watery diarrhea.  Did not have abdominal pain, fever or chills.  She took some over-the-counter Imodium, her symptoms have completely resolved today.  Patient states that she did not eat or drink well in the past several days.  Per her husband, patient passed out and fell Friday morning. She was also confused that time.  She did not have unilateral weakness, numbness or tingling his extremities been no facial droop or slurred speech.  She states that she has some mild burning on urination, but no dysuria or increased urinary frequency.  Patient denies chest pain, shortness breath, cough.  Patient was found to have persistent hypoglycemia with blood sugar 27.  Patient was started with D10 infusion.   ED Course: pt was found to have WBC 7.3, positive urinalysis (hazy appearance, small amount of leukocyte, positive nitrite, many bacteria, WBC 6-10), lactic acid 1.20, INR 1.71, stable renal function, temperature normal, heart rate in 90s, mild tachypnea, oxygen saturation 96% on room air, chest  x-ray negative.  CT head is negative for acute intracranial abnormalities, showed old infarction.  Patient is placed on telemetry bed for observation.  Review of Systems:   General: no fevers, chills, no body weight gain, has poor appetite, has fatigue HEENT: no blurry vision, hearing changes or sore throat Respiratory: no dyspnea, coughing, wheezing CV: no chest pain, no palpitations GI: had nausea, vomiting, diarrhea, no constipation abdominal pain, GU: no dysuria, burning on urination, increased urinary frequency, hematuria  Ext: has trace leg edema Neuro: no unilateral weakness, numbness, or tingling, no vision change or hearing loss. Had fall and syncope. Skin: no rash, no skin tear. MSK: No muscle spasm, no deformity, no limitation of range of movement in spin Heme: No easy bruising.  Travel history: No recent long distant travel.  Allergy:  Allergies  Allergen Reactions  . Darvon [Propoxyphene] Nausea And Vomiting  . Lisinopril Cough  . Septra [Sulfamethoxazole-Trimethoprim] Other (See Comments)    Increased INR  . Warfarin And Related Other (See Comments)    ONLY TOLERATES BRAND  . Penicillins Rash    Past Medical History:  Diagnosis Date  . Anemia   . Anginal pain ([place])   . Arteriovenous malformation of gastrointestinal tract   . Arthritis    "fingers mostly" (04/12/2015)  . Carotid artery disease ([place])    Carotid US 3/17:  88-41% RICA; 66-06% LICA; Elevated bilateral subclavian artery velocities >>f/u 1 year. // Carotid US 4/18: R 40-59; L 1-39 >> FU 1 year  . CHF (congestive heart failure) ([place])   . Chronic kidney disease (CKD), stage III (moderate) (HCC)   .  Chronic lower GI bleeding    "today; last time was ~ 8 yr ago; used to have them often before that too" (02/11/2013)  . Coronary artery disease   . DJD (degenerative joint disease)   . Dysrhythmia   . Heart murmur   . History of blood transfusion    "a few times over the years; usually related to my  Coumadin" (04/12/2015  . History of gout   . Hyperlipemia   . Hypertension   . Macula lutea degeneration   . Old MI (myocardial infarction)    "a coulple /dr in 02/2008; I never even knew I'd had them" (04/12/2015)  . Type II diabetes mellitus ([place])     Past Surgical History:  Procedure Laterality Date  . AORTIC VALVE REPLACEMENT  1993  . APPENDECTOMY  1953  . CARDIAC CATHETERIZATION  ~1990  . CARDIAC CATHETERIZATION  04/12/2015   Procedure: Coronary Stent Intervention;  Surgeon: Jettie Booze, MD;    SYNERGY DES 3.5X16 to the ostial RCA   . CARDIAC CATHETERIZATION  04/12/2015   Procedure: Coronary/Graft Angiography;  Surgeon: Eloy End, MD; LAD & CFX 100%, patent LIMA-LAD, SVG-D1; SVG-OM-PDA first limb 100%, 2nd limb patent; oRCA 75%>0 w/ stent  . [place]   St. Jude/notes 10/29/2003 (02/11/2013)  . COLONOSCOPY N/A 02/13/2013   Procedure: COLONOSCOPY;  Surgeon: Lafayette Dragon, MD;  Location: [facility] ENDOSCOPY;  Service: Endoscopy;  Laterality: N/A;  . CORONARY ANGIOPLASTY WITH STENT PLACEMENT  2009+   "3 at least; put in 1 stent at a time" (02/11/2013)  . CORONARY ARTERY BYPASS GRAFT  1990   LIMA-LAD, SVG-OM-PDA, SVG-D1  . DILATION AND CURETTAGE OF UTERUS  1960's   'after a miscarriage" (02/11/2013)  . ENTEROSCOPY N/A 02/13/2013   Procedure: ENTEROSCOPY;  Surgeon: Lafayette Dragon, MD;  Location: [facility] ENDOSCOPY;  Service: Endoscopy;  Laterality: N/A;  . EXPLORATORY LAPAROTOMY  02/24/2008   which revealed a retroperitoneal hematoma and bleeding from the right external iliac artery/notes 03/02/2008 (02/11/2013)   . FLEXIBLE SIGMOIDOSCOPY N/A 04/21/2015   Procedure: FLEXIBLE SIGMOIDOSCOPY;  Surgeon: Ladene Artist, MD;  Location: [facility] ENDOSCOPY;  Service: Endoscopy;  Laterality: N/A;    Social History:  reports that she has never smoked. She has never used smokeless tobacco. She reports that she does not drink alcohol or use drugs.  Family History:  Family History    Problem Relation Age of Onset  . Heart disease Mother   . Heart disease Father      Prior to Admission medications   Medication Sig Start Date End Date Taking? Authorizing Provider  aspirin EC 81 MG tablet Take 1 tablet (81 mg total) by mouth daily. 04/17/16  Yes Jettie Booze, MD  atorvastatin (LIPITOR) 80 MG tablet Take 1 tablet (80 mg total) by mouth daily. 11/17/17  Yes Jettie Booze, MD  bevacizumab (AVASTIN) 1.25 mg/0.1 mL SOLN Place 1.25 mg into the left eye every 8 (eight) weeks.   Yes [provider]  Canagliflozin (INVOKANA) 100 MG TABS Take 100 mg by mouth daily.    Yes [provider]  COUMADIN 5 MG tablet USE AS DIRECTED BY  COUMADIN  CLINIC Patient taking differently: Take 2.5-5 mg by mouth See admin instructions. Take 2.5mg  by mouth on wednesdays, all other days take 5mg . 05/31/18  Yes Jettie Booze, MD  digoxin (LANOXIN) 0.125 MG tablet Take 0.125 mg by mouth daily.   Yes [provider]  ezetimibe (ZETIA) 10 MG  tablet TAKE 1 TABLET BY MOUTH ONCE DAILY Patient taking differently: Take 10 mg by mouth daily.  02/16/18  Yes Jettie Booze, MD  FeFum-FePo-FA-B Cmp-C-Zn-Mn-Cu (SE-TAN PLUS) 162-115.2-1 MG CAPS Take 1 tablet by mouth 2 (two) times daily. 05/22/15  Yes [provider]  folic acid (FOLVITE) 096 MCG tablet Take 400 mcg by mouth every evening.    Yes [provider]  furosemide (LASIX) 40 MG tablet Take 1 tablet (40 mg total) by mouth daily. 05/03/15  Yes Weaver, Scott T, PA-C  glimepiride (AMARYL) 4 MG tablet Take 4 mg by mouth daily before breakfast. 03/24/15  Yes [provider]  isosorbide mononitrate (IMDUR) 60 MG 24 hr tablet TAKE 1 TABLET BY MOUTH ONCE DAILY Patient taking differently: Take 60 mg by mouth daily.  01/26/18  Yes Jettie Booze, MD  JANUVIA 50 MG tablet Take 50 mg by mouth daily.  11/26/13  Yes [provider]  losartan (COZAAR) 50 MG tablet Take 50 mg by mouth daily.    Yes [provider]  metoprolol tartrate (LOPRESSOR) 25 MG tablet Take 0.5 tablets (12.5 mg total) by mouth 2 (two) times daily. 05/03/15  Yes Weaver, Scott T, PA-C  Multiple Vitamin (MULTIVITAMIN WITH MINERALS) TABS Take 1 tablet by mouth daily.   Yes [provider]  Multiple Vitamins-Minerals (PRESERVISION AREDS 2 PO) Take 2 tablets by mouth daily.   Yes [provider]  Omega-3 Fatty Acids 1200 MG CAPS Take 1 capsule by mouth 2 (two) times daily.    Yes [provider]  omeprazole (PRILOSEC) 20 MG capsule Take 1 capsule (20 mg total) by mouth daily. 07/06/18  Yes Icard, Bradley L, DO  potassium chloride (K-DUR,KLOR-CON) 10 MEQ tablet Take 20 mEq by mouth 2 (two) times daily.    Yes [provider]  cephALEXin (KEFLEX) 500 MG capsule Take 1 capsule (500 mg total) by mouth 2 (two) times daily for 7 days. 10/01/18 10/08/18  Little, Wenda Overland, MD  Mometasone Furoate [facility] HFA) 100 MCG/ACT AERO Inhale 1 puff into the lungs 2 (two) times daily. Patient not taking: Reported on 10/01/2018 08/24/18   Icard, Octavio Graves, DO  nitroGLYCERIN (NITROSTAT) 0.4 MG SL tablet Place 1 tablet (0.4 mg total) under the tongue every 5 (five) minutes as needed for chest pain. Please make appt for January. 1st attempt Patient taking differently: Place 0.4 mg under the tongue every 5 (five) minutes as needed for chest pain.  09/07/17   Jettie Booze, MD    Physical Exam: Vitals:   10/01/18 2100 10/01/18 2101 10/01/18 2358 10/02/18 0623  BP: (!) 162/52  (!) 140/55 127/66  Pulse: 91  91 89  Resp: 18  18 18   Temp: 99.8 F (37.7 C)  99.3 F (37.4 C) 98.9 F (37.2 C)  TempSrc: Oral  Oral Oral  SpO2: 97%  98% 96%  Weight:  64.7 kg    Height:  5' (1.524 m)     General: Not in acute distress HEENT:       Eyes: PERRL, EOMI, no scleral icterus.       ENT: No discharge from the ears and nose, no pharynx injection, no tonsillar enlargement.        Neck: No JVD, no  bruit, no mass felt. Heme: No neck lymph node enlargement. Cardiac: E4/V4, RRR, 3/6 systolic murmurs, No gallops or rubs. Respiratory:  No rales, wheezing, rhonchi or rubs. GI: Soft, nondistended, nontender, no rebound pain, no organomegaly, BS present. GU:  No hematuria Ext: trace leg edema bilaterally. 2+DP/PT pulse bilaterally. Musculoskeletal: No joint deformities, No joint redness or warmth, no limitation of ROM in spin. Skin: No rashes.  Neuro: Alert, oriented X3, cranial nerves II-XII grossly intact, moves all extremities normally. Psych: Patient is not psychotic, no suicidal or hemocidal ideation.  Labs on Admission: I have personally reviewed following labs and imaging studies  CBC: Recent Labs  Lab 10/01/18 1043 10/02/18 0607  WBC 7.6 6.9  NEUTROABS 6.0  --   HGB 11.4* 8.9*  HCT 35.9* 28.0*  MCV 94.2 93.3  PLT 244 098   Basic Metabolic Panel: Recent Labs  Lab 10/01/18 1043 10/02/18 0607  NA 134* 136  K 4.0 4.3  CL 101 108  CO2 21* 22  GLUCOSE 106* 120*  BUN 29* 17  CREATININE 1.49* 1.35*  CALCIUM 8.7* 8.2*   GFR: Estimated Creatinine Clearance: 26.5 mL/min (A) (by C-G formula based on SCr of 1.35 mg/dL (H)). Liver Function Tests: Recent Labs  Lab 10/01/18 1043  AST 36  ALT 27  ALKPHOS 41  BILITOT 1.5*  PROT 6.9  ALBUMIN 3.8   Recent Labs  Lab 10/01/18 1043  LIPASE 28   No results for input(s): AMMONIA in the last 168 hours. Coagulation Profile: Recent Labs  Lab 09/28/18 1148 10/01/18 1043 10/02/18 0607  INR 2.8 1.71 2.41   Cardiac Enzymes: Recent Labs  Lab 10/01/18 2025  CKTOTAL 245*   BNP (last 3 results) No results for input(s): PROBNP in the last 8760 hours. HbA1C: No results for input(s): HGBA1C in the last 72 hours. CBG: Recent Labs  Lab 10/01/18 1858 10/01/18 2033 10/01/18 2215 10/02/18 0008 10/02/18 0205  GLUCAP 85 76 73 94 139*   Lipid Profile: No results for input(s): CHOL, HDL, LDLCALC, TRIG, CHOLHDL, LDLDIRECT  in the last 72 hours. Thyroid Function Tests: No results for input(s): TSH, T4TOTAL, FREET4, T3FREE, THYROIDAB in the last 72 hours. Anemia Panel: No results for input(s): VITAMINB12, FOLATE, FERRITIN, TIBC, IRON, RETICCTPCT in the last 72 hours. Urine analysis:    Component Value Date/Time   COLORURINE YELLOW 10/01/2018 1044   APPEARANCEUR HAZY (A) 10/01/2018 1044   LABSPEC 1.009 10/01/2018 1044   PHURINE 5.0 10/01/2018 1044   GLUCOSEU 150 (A) 10/01/2018 1044   HGBUR NEGATIVE 10/01/2018 1044   BILIRUBINUR NEGATIVE 10/01/2018 1044   KETONESUR NEGATIVE 10/01/2018 1044   PROTEINUR NEGATIVE 10/01/2018 1044   UROBILINOGEN 0.2 04/20/2008 2135   NITRITE POSITIVE (A) 10/01/2018 1044   LEUKOCYTESUR SMALL (A) 10/01/2018 1044   Sepsis Labs: @LABRCNTIP (procalcitonin:4,lacticidven:4) ) Recent Results (from the past 240 hour(s))  Culture, blood (Routine X 2) w Reflex to ID Panel     Status: None (Preliminary result)   Collection Time: 10/01/18  8:25 PM  Result Value Ref Range Status   Specimen Description BLOOD LEFT ANTECUBITAL  Final   Special Requests   Final    BOTTLES DRAWN AEROBIC AND ANAEROBIC Blood Culture adequate volume   Culture   Final    NO GROWTH < 12 HOURS Performed at [facility], [place] [address] [place], [place] [address]    Report Status PENDING  Incomplete  Culture, blood (Routine X 2) w Reflex to ID Panel     Status: None (Preliminary result)   Collection Time: 10/01/18  8:25 PM  Result Value Ref Range Status   Specimen Description BLOOD BLOOD LEFT HAND  Final   Special Requests   Final    BOTTLES DRAWN AEROBIC ONLY Blood Culture  adequate volume   Culture   Final    NO GROWTH < 12 HOURS Performed at [facility] [address], [place], [place] [address]    Report Status PENDING  Incomplete  MRSA PCR Screening     Status: None   Collection Time: 10/01/18  9:17 PM  Result Value Ref Range Status   MRSA by PCR NEGATIVE NEGATIVE Final    Comment:         The GeneXpert MRSA Assay (FDA approved for NASAL specimens only), is one component of a comprehensive MRSA colonization surveillance program. It is not intended to diagnose MRSA infection nor to guide or monitor treatment for MRSA infections. Performed at [facility], [place] [address], [place], [place] [address]      Radiological Exams on Admission: Dg Chest 2 View  Result Date: 10/01/2018 CLINICAL DATA:  Shortness of breath on exertion EXAM: CHEST - 2 VIEW COMPARISON:  06/01/2018 FINDINGS: Cardiac shadow is stable. Postsurgical changes are again noted. The lungs are well aerated bilaterally. No focal infiltrate or sizable effusion is seen. No acute bony abnormality is noted. IMPRESSION: No acute abnormality noted. Electronically Signed   By: Inez Catalina M.D.   On: 10/01/2018 11:19   Ct Head Wo Contrast  Result Date: 10/01/2018 CLINICAL DATA:  Head trauma.  Fall going to bathroom.  The EXAM: CT HEAD WITHOUT CONTRAST TECHNIQUE: Contiguous axial images were obtained from the base of the skull through the vertex without intravenous contrast. COMPARISON:  None FINDINGS: Brain: No evidence of acute infarction, hemorrhage, hydrocephalus, extra-axial collection or mass lesion/mass effect. Chronic infarct involving the left posterior parietal lobe identified, image 22/3. There is mild diffuse low-attenuation within the subcortical and periventricular white matter compatible with chronic microvascular disease. Prominence of the sulci and ventricles compatible with brain atrophy. Vascular: No hyperdense vessel or unexpected calcification. Skull: Normal. Negative for fracture or focal lesion. Sinuses/Orbits: No acute finding. Other: None. IMPRESSION: 1. No acute intracranial abnormalities. 2. Chronic small vessel ischemic disease and brain atrophy. 3. Chronic left posterior parietal infarct. Electronically Signed   By: Kerby Moors M.D.   On: 10/01/2018 11:49     EKG: Independently reviewed.   Atrial fibrillation, QTc 445, LAD, left bundle blockage which seem to be old, poor R wave progression, anteroseptal infarction pattern   Assessment/Plan Principal Problem:   Hypoglycemia Active Problems:   Normocytic anemia   Chronic anticoagulation   S/P AVR (aortic valve replacement)   Coronary Artery Disease   Type II diabetes mellitus with renal manifestations (HCC)   HTN (hypertension)   Hyperlipidemia   CKD (chronic kidney disease), stage III (HCC)   PAF (paroxysmal atrial fibrillation) (HCC)   Fall   Syncope   UTI (urinary tract infection)   Nausea vomiting and diarrhea   Chronic diastolic CHF (congestive heart failure) (HCC)   Hypoglycemia: Most likely due to decreased oral intake and continuation of diabetic medication (Januvia, Invokana and Amaryl). -Placed on telemetry bed for observation - CBG every 2 hours - PRN D50, -D10 at 75 cc/h  UTI (urinary tract infection): -IV Rocephin - Follow-up blood culture and urine culture  Nausea vomiting and diarrhea: Has resolved.  Likely due to viral gastroenteritis. -Observe  Fall and syncope: Most likely due to hypoglycemia and UTI.  Currently patient's mental status normal.  CT head is negative for acute intracranial abnormalities. -check orthostatic vitals -hydration and hold off lasix -Do not expect new issues when hypoglycemia is corrected.  Chronic systolic  CHF: 2D echo on 04/10/2015 showed EF CT deficit 5%.  Patient has trace leg edema, but no respiratory distress.  CHF seems compensated. -Check BNP -Hold Lasix due to syncope.  Normocytic anemia: Hgb 11.4.  Patient states that she had dark stool when she had nausea vomiting and diarrhea. -Check FOBT  Coronary Artery Disease: no CP -on aspirin, Zetia, Lipitor, metoprolol, Imdur -prn NTG  Type II diabetes mellitus with renal manifestations ([place]): Last A1c 6.2 on 04/17/2015, well controled. Patient is taking Invakana Januvia, Amaryl at home -hold home med due to  hypoglycemia  Hyperlipidemia: -Zetia, Lipitor  CKD (chronic kidney disease), stage III ([place]): Stable.  Baseline creatinine 1.4-1.6.  Her creatinine is 1.49, BUN 29 -Follow-up renal function by BMP  Atrial Fibrillation: CHA2DS2-VASc Score is 7, needs oral anticoagulation. Patient is on comadin at home. INR is 1.71 on admission. --Continue metoprolol -Continue Coumadin per pharmacy  HTN:  -Continue home medications: Cozaar -IV hydralazine prn    DVT ppx: couamdin Code Status: Full code Family Communication:   Yes, patient's huband at bed side Disposition Plan:  Anticipate discharge back to previous home environment Consults called:  none Admission status: Obs / tele     Date of Service 10/02/2018    Ivor Costa [facility] Pager [phone]  If 7PM-7AM, please contact night-coverage [email or web address] Password [facility] 10/02/2018, 7:19 AM

## 2018-10-02 DIAGNOSIS — I5032 Chronic diastolic (congestive) heart failure: Secondary | ICD-10-CM | POA: Diagnosis not present

## 2018-10-02 DIAGNOSIS — E1122 Type 2 diabetes mellitus with diabetic chronic kidney disease: Secondary | ICD-10-CM

## 2018-10-02 DIAGNOSIS — W19XXXA Unspecified fall, initial encounter: Secondary | ICD-10-CM | POA: Diagnosis not present

## 2018-10-02 DIAGNOSIS — E11649 Type 2 diabetes mellitus with hypoglycemia without coma: Secondary | ICD-10-CM | POA: Diagnosis not present

## 2018-10-02 DIAGNOSIS — E785 Hyperlipidemia, unspecified: Secondary | ICD-10-CM | POA: Diagnosis not present

## 2018-10-02 DIAGNOSIS — N390 Urinary tract infection, site not specified: Secondary | ICD-10-CM | POA: Diagnosis not present

## 2018-10-02 DIAGNOSIS — D62 Acute posthemorrhagic anemia: Secondary | ICD-10-CM

## 2018-10-02 DIAGNOSIS — I2510 Atherosclerotic heart disease of native coronary artery without angina pectoris: Secondary | ICD-10-CM | POA: Diagnosis not present

## 2018-10-02 DIAGNOSIS — Z952 Presence of prosthetic heart valve: Secondary | ICD-10-CM | POA: Diagnosis not present

## 2018-10-02 DIAGNOSIS — Z7901 Long term (current) use of anticoagulants: Secondary | ICD-10-CM | POA: Diagnosis not present

## 2018-10-02 DIAGNOSIS — I480 Paroxysmal atrial fibrillation: Secondary | ICD-10-CM | POA: Diagnosis not present

## 2018-10-02 DIAGNOSIS — Z5181 Encounter for therapeutic drug level monitoring: Secondary | ICD-10-CM

## 2018-10-02 DIAGNOSIS — R112 Nausea with vomiting, unspecified: Secondary | ICD-10-CM | POA: Diagnosis not present

## 2018-10-02 DIAGNOSIS — N183 Chronic kidney disease, stage 3 (moderate): Secondary | ICD-10-CM | POA: Diagnosis not present

## 2018-10-02 DIAGNOSIS — E162 Hypoglycemia, unspecified: Secondary | ICD-10-CM | POA: Diagnosis not present

## 2018-10-02 DIAGNOSIS — I2583 Coronary atherosclerosis due to lipid rich plaque: Secondary | ICD-10-CM

## 2018-10-02 LAB — BASIC METABOLIC PANEL
Anion gap: 6 (ref 5–15)
BUN: 17 mg/dL (ref 8–23)
CO2: 22 mmol/L (ref 22–32)
CREATININE: 1.35 mg/dL — AB (ref 0.44–1.00)
Calcium: 8.2 mg/dL — ABNORMAL LOW (ref 8.9–10.3)
Chloride: 108 mmol/L (ref 98–111)
GFR calc Af Amer: 42 mL/min — ABNORMAL LOW (ref >60)
GFR calc non Af Amer: 36 mL/min — ABNORMAL LOW (ref >60)
Glucose, Bld: 120 mg/dL — ABNORMAL HIGH (ref 70–99)
Potassium: 4.3 mmol/L (ref 3.5–5.1)
Sodium: 136 mmol/L (ref 135–145)

## 2018-10-02 LAB — CBC
HCT: 28 % — ABNORMAL LOW (ref 36.0–46.0)
HCT: 27.9 % — ABNORMAL LOW (ref 36.0–46.0)
Hemoglobin: 8.9 g/dL — ABNORMAL LOW (ref 12.0–15.0)
Hemoglobin: 8.9 g/dL — ABNORMAL LOW (ref 12.0–15.0)
MCH: 29.7 pg (ref 26.0–34.0)
MCH: 29.7 pg (ref 26.0–34.0)
MCHC: 31.8 g/dL (ref 30.0–36.0)
MCHC: 31.9 g/dL (ref 30.0–36.0)
MCV: 93.3 fL (ref 80.0–100.0)
MCV: 93 fL (ref 80.0–100.0)
NRBC: 0 % (ref 0.0–0.2)
Platelets: 194 10*3/uL (ref 150–400)
Platelets: 196 10*3/uL (ref 150–400)
RBC: 3 MIL/uL — ABNORMAL LOW (ref 3.87–5.11)
RBC: 3 MIL/uL — AB (ref 3.87–5.11)
RDW: 13.9 % (ref 11.5–15.5)
RDW: 14 % (ref 11.5–15.5)
WBC: 6.9 10*3/uL (ref 4.0–10.5)
WBC: 7.1 10*3/uL (ref 4.0–10.5)
nRBC: 0 % (ref 0.0–0.2)

## 2018-10-02 LAB — PROTIME-INR
INR: 2.41
Prothrombin Time: 25.9 seconds — ABNORMAL HIGH (ref 11.4–15.2)

## 2018-10-02 LAB — GLUCOSE, CAPILLARY
GLUCOSE-CAPILLARY: 130 mg/dL — AB (ref 70–99)
Glucose-Capillary: 120 mg/dL — ABNORMAL HIGH (ref 70–99)
Glucose-Capillary: 139 mg/dL — ABNORMAL HIGH (ref 70–99)
Glucose-Capillary: 154 mg/dL — ABNORMAL HIGH (ref 70–99)
Glucose-Capillary: 94 mg/dL (ref 70–99)

## 2018-10-02 LAB — CORTISOL-AM, BLOOD: Cortisol - AM: 11.7 ug/dL (ref 6.7–22.6)

## 2018-10-02 MED ORDER — CEPHALEXIN 500 MG PO CAPS: 500.0000 mg | ORAL_CAPSULE | Freq: Two times a day (BID) | ORAL | 0 refills | Status: AC

## 2018-10-02 MED ORDER — WARFARIN SODIUM 2.5 MG PO TABS
2.5000 mg | ORAL_TABLET | Freq: Once | ORAL | Status: AC
  Administered 2018-10-02: 2.5 mg via ORAL
  Filled 2018-10-02: qty 1

## 2018-10-02 NOTE — Progress Notes (Signed)
RN rounded on pt. Pt states she does not need anything at this time. 

## 2018-10-02 NOTE — Plan of Care (Signed)
  Problem: Education: Goal: Knowledge of General Education information will improve Description: Including pain rating scale, medication(s)/side effects and non-pharmacologic comfort measures Outcome: Progressing   Problem: Health Behavior/Discharge Planning: Goal: Ability to manage health-related needs will improve Outcome: Progressing   Problem: Clinical Measurements: Goal: Ability to maintain clinical measurements within normal limits will improve Outcome: Progressing   Problem: Nutrition: Goal: Adequate nutrition will be maintained Outcome: Progressing   Problem: Skin Integrity: Goal: Risk for impaired skin integrity will decrease Outcome: Progressing   

## 2018-10-02 NOTE — Progress Notes (Signed)
Pt discharge education and instructions completed with pt and son at bedside; both voices understanding and denies any questions. Pt IV and telemetry removed; pt discharge home with son to transport her home. Pt handed her prescriptions for Keflex and pt transported off unit via wheelchair with belongings and son to the side. Delia Heady RN

## 2018-10-02 NOTE — Progress Notes (Signed)
ANTICOAGULATION CONSULT NOTE - Initial Consult  Pharmacy Consult for warfarin Indication: atrial fibrillation  Allergies  Allergen Reactions  . Darvon [Propoxyphene] Nausea And Vomiting  . Lisinopril Cough  . Septra [Sulfamethoxazole-Trimethoprim] Other (See Comments)    Increased INR  . Warfarin And Related Other (See Comments)    ONLY TOLERATES BRAND  . Penicillins Rash    Patient Measurements: Height: 5' (152.4 cm) Weight: 142 lb 10.2 oz (64.7 kg) IBW/kg (Calculated) : 45.5 Heparin Dosing Weight: n/a   Vital Signs: Temp: 98.9 F (37.2 C) (01/11 0623) Temp Source: Oral (01/11 0623) BP: 122/64 (01/11 0844) Pulse Rate: 88 (01/11 0844)  Labs: Recent Labs    10/01/18 1043 10/01/18 2025 10/02/18 0607  HGB 11.4*  --  8.9*  HCT 35.9*  --  28.0*  PLT 244  --  194  LABPROT 19.9*  --  25.9*  INR 1.71  --  2.41  CREATININE 1.49*  --  1.35*  CKTOTAL  --  245*  --     Estimated Creatinine Clearance: 26.5 mL/min (A) (by C-G formula based on SCr of 1.35 mg/dL (H)).   Medical History: Past Medical History:  Diagnosis Date  . Anemia   . Anginal pain ([place])   . Arteriovenous malformation of gastrointestinal tract   . Arthritis    "fingers mostly" (04/12/2015)  . Carotid artery disease ([place])    Carotid US 3/17:  83-15% RICA; 17-61% LICA; Elevated bilateral subclavian artery velocities >>f/u 1 year. // Carotid US 4/18: R 40-59; L 1-39 >> FU 1 year  . CHF (congestive heart failure) ([place])   . Chronic kidney disease (CKD), stage III (moderate) (HCC)   . Chronic lower GI bleeding    "today; last time was ~ 8 yr ago; used to have them often before that too" (02/11/2013)  . Coronary artery disease   . DJD (degenerative joint disease)   . Dysrhythmia   . Heart murmur   . History of blood transfusion    "a few times over the years; usually related to my Coumadin" (04/12/2015  . History of gout   . Hyperlipemia   . Hypertension   . Macula lutea degeneration   . Old MI  (myocardial infarction)    "a coulple /dr in 02/2008; I never even knew I'd had them" (04/12/2015)  . Type II diabetes mellitus (HCC)     Medications:  Medications Prior to Admission  Medication Sig Dispense Refill Last Dose  . aspirin EC 81 MG tablet Take 1 tablet (81 mg total) by mouth daily.   09/30/2018 at Unknown time  . atorvastatin (LIPITOR) 80 MG tablet Take 1 tablet (80 mg total) by mouth daily. 90 tablet 3 09/30/2018 at Unknown time  . bevacizumab (AVASTIN) 1.25 mg/0.1 mL SOLN Place 1.25 mg into the left eye every 8 (eight) weeks.   09/22/2018  . Canagliflozin (INVOKANA) 100 MG TABS Take 100 mg by mouth daily.    09/30/2018 at Unknown time  . COUMADIN 5 MG tablet USE AS DIRECTED BY  COUMADIN  CLINIC (Patient taking differently: Take 2.5-5 mg by mouth See admin instructions. Take 2.5mg  by mouth on wednesdays, all other days take 5mg .) 40 tablet 3 09/30/2018 at 9pm  . digoxin (LANOXIN) 0.125 MG tablet Take 0.125 mg by mouth daily.   09/30/2018 at Unknown time  . ezetimibe (ZETIA) 10 MG tablet TAKE 1 TABLET BY MOUTH ONCE DAILY (Patient taking differently: Take 10 mg by mouth daily. ) 90 tablet 2 09/30/2018 at Unknown time  .  FeFum-FePo-FA-B Cmp-C-Zn-Mn-Cu (SE-TAN PLUS) 162-115.2-1 MG CAPS Take 1 tablet by mouth 2 (two) times daily.  3 09/30/2018 at Unknown time  . folic acid (FOLVITE) 329 MCG tablet Take 400 mcg by mouth every evening.    09/30/2018 at Unknown time  . furosemide (LASIX) 40 MG tablet Take 1 tablet (40 mg total) by mouth daily. 30 tablet 11 09/30/2018 at Unknown time  . glimepiride (AMARYL) 4 MG tablet Take 4 mg by mouth daily before breakfast.   09/30/2018 at Unknown time  . isosorbide mononitrate (IMDUR) 60 MG 24 hr tablet TAKE 1 TABLET BY MOUTH ONCE DAILY (Patient taking differently: Take 60 mg by mouth daily. ) 90 tablet 2 09/30/2018 at Unknown time  . JANUVIA 50 MG tablet Take 50 mg by mouth daily.    09/30/2018 at Unknown time  . losartan (COZAAR) 50 MG tablet Take 50 mg by mouth daily.    09/30/2018 at Unknown time  . metoprolol tartrate (LOPRESSOR) 25 MG tablet Take 0.5 tablets (12.5 mg total) by mouth 2 (two) times daily. 60 tablet 11 09/30/2018 at 9pm   . Multiple Vitamin (MULTIVITAMIN WITH MINERALS) TABS Take 1 tablet by mouth daily.   09/30/2018 at Unknown time  . Multiple Vitamins-Minerals (PRESERVISION AREDS 2 PO) Take 2 tablets by mouth daily.   09/30/2018 at Unknown time  . Omega-3 Fatty Acids 1200 MG CAPS Take 1 capsule by mouth 2 (two) times daily.    09/30/2018 at Unknown time  . omeprazole (PRILOSEC) 20 MG capsule Take 1 capsule (20 mg total) by mouth daily. 30 capsule 5 09/30/2018 at Unknown time  . potassium chloride (K-DUR,KLOR-CON) 10 MEQ tablet Take 20 mEq by mouth 2 (two) times daily.    09/30/2018 at Unknown time  . Mometasone Furoate (ASMANEX HFA) 100 MCG/ACT AERO Inhale 1 puff into the lungs 2 (two) times daily. (Patient not taking: Reported on 10/01/2018) 1 Inhaler 0 Not Taking at Unknown time  . nitroGLYCERIN (NITROSTAT) 0.4 MG SL tablet Place 1 tablet (0.4 mg total) under the tongue every 5 (five) minutes as needed for chest pain. Please make appt for January. 1st attempt (Patient taking differently: Place 0.4 mg under the tongue every 5 (five) minutes as needed for chest pain. ) 25 tablet 0 unknown    Assessment: 76 YOF with N/V/D. Pt is on warfarin at home for h/o Afib. INR on admission is subtherapeutic at 1.71. PTA patient had not eaten or drank well in the past several day. H/H low. Plt wnl. Last dose of warfarin PTA was 09/30/18. Of note, patient has a lower INR goal due to history of GI bleed in 01/2013 per outpatient anti-coagulation records.    Home warfarin dose: 2.5 mg on Wed; 5 mg on all other day   Due to noticeable decrease in CBC (Hgb 8.9, Pltc 194), INR of 2.41 today, will be more conservative with dosing tonight.   Goal of Therapy:  INR 2 - 2.5 Monitor platelets by anticoagulation protocol: Yes   Plan:  -Warfarin 2.5 mg x 1 dose (patients uses brand  name)  -Daily PT/INR  Thank you for allowing pharmacy to be a part of this patient's care.  Leron Croak, PharmD PGY1 Pharmacy Resident Phone: [phone]  Please check AMION for all [facility] phone numbers

## 2018-10-02 NOTE — Discharge Summary (Signed)
[name], is a 85 y.o. female  DOB 08/01/1933  MRN [id number].  Admission date:  10/01/2018  Admitting Physician  Ivor Costa, MD  Discharge Date:  10/02/2018   Primary MD  Gaynelle Arabian, MD  Recommendations for primary care physician for things to follow:   Oral hypoglycemic agent regimens and recheck of CBC, BMP, and INR   Admission Diagnosis  Hypoglycemia [E16.2] Transient disorientation [R41.0] Urinary tract infection without hematuria, site unspecified [N39.0]   Discharge Diagnosis  Hypoglycemia [E16.2] Transient disorientation [R41.0] Urinary tract infection without hematuria, site unspecified [N39.0]    Principal Problem:   Hypoglycemia Active Problems:   Normocytic anemia   Chronic anticoagulation   S/P AVR (aortic valve replacement)   Coronary Artery Disease   Type II diabetes mellitus with renal manifestations (HCC)   HTN (hypertension)   Hyperlipidemia   CKD (chronic kidney disease), stage III (HCC)   PAF (paroxysmal atrial fibrillation) ([place])   Fall   Syncope   UTI (urinary tract infection)   Nausea vomiting and diarrhea   Chronic diastolic CHF (congestive heart failure) ([place])      Past Medical History:  Diagnosis Date  . Anemia   . Anginal pain ([place])   . Arteriovenous malformation of gastrointestinal tract   . Arthritis    "fingers mostly" (04/12/2015)  . Carotid artery disease ([place])    Carotid US 3/17:  72-90% RICA; 21-11% LICA; Elevated bilateral subclavian artery velocities >>f/u 1 year. // Carotid US 4/18: R 40-59; L 1-39 >> FU 1 year  . CHF (congestive heart failure) ([place])   . Chronic kidney disease (CKD), stage III (moderate) (HCC)   . Chronic lower GI bleeding    "today; last time was ~ 8 yr ago; used to have them often before that too" (02/11/2013)  . Coronary artery disease   .  DJD (degenerative joint disease)   . Dysrhythmia   . Heart murmur   . History of blood transfusion    "a few times over the years; usually related to my Coumadin" (04/12/2015  . History of gout   . Hyperlipemia   . Hypertension   . Macula lutea degeneration   . Old MI (myocardial infarction)    "a coulple /dr in 02/2008; I never even knew I'd had them" (04/12/2015)  . Type II diabetes mellitus ([place])     Past Surgical History:  Procedure Laterality Date  . AORTIC VALVE REPLACEMENT  1993  . APPENDECTOMY  1953  . CARDIAC CATHETERIZATION  ~1990  . CARDIAC CATHETERIZATION  04/12/2015   Procedure: Coronary Stent Intervention;  Surgeon: Jettie Booze, MD;    SYNERGY DES 3.5X16 to the ostial RCA   . CARDIAC CATHETERIZATION  04/12/2015   Procedure: Coronary/Graft Angiography;  Surgeon: Eloy End, MD; LAD & CFX 100%, patent LIMA-LAD, SVG-D1; SVG-OM-PDA first limb 100%, 2nd limb patent; oRCA 75%>0 w/ stent  . [place]   St. Jude/notes 10/29/2003 (02/11/2013)  . COLONOSCOPY N/A 02/13/2013   Procedure: COLONOSCOPY;  Surgeon:  Lafayette Dragon, MD;  Location: [facility] ENDOSCOPY;  Service: Endoscopy;  Laterality: N/A;  . CORONARY ANGIOPLASTY WITH STENT PLACEMENT  2009+   "3 at least; put in 1 stent at a time" (02/11/2013)  . CORONARY ARTERY BYPASS GRAFT  1990   LIMA-LAD, SVG-OM-PDA, SVG-D1  . DILATION AND CURETTAGE OF UTERUS  1960's   'after a miscarriage" (02/11/2013)  . ENTEROSCOPY N/A 02/13/2013   Procedure: ENTEROSCOPY;  Surgeon: Lafayette Dragon, MD;  Location: [facility] ENDOSCOPY;  Service: Endoscopy;  Laterality: N/A;  . EXPLORATORY LAPAROTOMY  02/24/2008   which revealed a retroperitoneal hematoma and bleeding from the right external iliac artery/notes 03/02/2008 (02/11/2013)   . FLEXIBLE SIGMOIDOSCOPY N/A 04/21/2015   Procedure: FLEXIBLE SIGMOIDOSCOPY;  Surgeon: Ladene Artist, MD;  Location: [facility] ENDOSCOPY;  Service: Endoscopy;  Laterality: N/A;       HPI  from the history and  physical done on the day of admission:  [name] is a 85 y.o. female with medical history significant of hypertension, hyperlipidemia, diabetes mellitus, GERD, gout, CKD stage III, anemia, GI bleeding, CHF, CKD 3, CAD, stent placement, CABG, aortic valve replacement with a prosthetic valve, atrial fibrillation on Coumadin, who presents with nausea, vomiting, diarrhea, fall, syncope, burning on urination hypoglycemia.  Patient states that she started having nausea vomiting, diarrhea since Wednesday.  Initially she vomited 2 or 3 times with nonbilious and nonbloody vomitus.  She also has several episodes of watery diarrhea.  Did not have abdominal pain, fever or chills.  She took some over-the-counter Imodium, her symptoms have completely resolved today.  Patient states that she did not eat or drink well in the past several days.  Per her husband, patient passed out and fell Friday morning. She was also confused that time.  She did not have unilateral weakness, numbness or tingling his extremities been no facial droop or slurred speech.  She states that she has some mild burning on urination, but no dysuria or increased urinary frequency.  Patient denies chest pain, shortness breath, cough.  Patient was found to have persistent hypoglycemia with blood sugar 27.  Patient was started with D10 infusion.   ED Course: pt was found to have WBC 7.3, positive urinalysis (hazy appearance, small amount of leukocyte, positive nitrite, many bacteria, WBC 6-10), lactic acid 1.20, INR 1.71, stable renal function, temperature normal, heart rate in 90s, mild tachypnea, oxygen saturation 96% on room air, chest x-ray negative.  CT head is negative for acute intracranial abnormalities, showed old infarction.  Patient is placed on telemetry bed for observation.     Hospital Course:   1.  Hypoglycemia: Patient's blood glucose dropped as low as 27 on admission.  Prior to admission patient had had nausea, vomiting, and  diarrhea that have since resolved.  She also admits to not always regularly eating 3 meals a day.  Patient's medication of Invokana, Januvia, and Amaryl were held.  She was placed on D10 drip and given as needed D50 night.  Patient blood sugars stable and fluids were discontinued.  Patient advised to hold Januvia and Amaryl until able to follow-up with primary care provider.  2.  Acute blood loss anemia: Admission patient's hemoglobin to be 11.4, but repeat check the following day noted to be 8.9.  She was typed and screened for possible need of blood products.  Patient endorsed black stools.  Repeat blood counts noted to be stable at 8.9.  She admits to previous histories of GI bleeds while on Coumadin for  which she has transfusions.  Discussed risks and benefits of being discharged home in stable bleeding.  Patient advised to follow-up with her primary care provider for repeat check on 1/13 if possible.  3.  Subtherapeutic INR on chronic anticoagulation: Resolved.  Patient's initial INR noted to be 1.7, but recheck 2.1 on hospital day 2.  Advised patient of need to follow-up for repeat INR on Monday given she is on antibiotics and possibly had a recent bleed.  4.  Urinary tract infection: Acute.  Urinalysis showed signs of infection for which patient has been started on Rocephin.  Urine culture grew out pansensitive E. coli.  At discharge patient was prescribed Keflex.  5.  Coronary artery disease: Stable.  Patient did not complain of chest pain.  She was continued on aspirin, Zetia, Lipitor, and Imdur.  6.  Paroxysmal atrial fibrillation: CHA2DS2-VASc Score  equals 7.  Patient reports needing to have a INR of 2-2.5. Patient was continued on Coumadin and INR noted to be therapeutic on hospital day 2.  7.  Fall with syncope: Most likely due to hypoglycemia.  No signs of any intracranial hemorrhage noted on CT scan.    8.  Nausea, vomiting, and diarrhea: Resolved prior to admission  9.  Essential  hypertension: Stable.  Continued home medications except for Lasix initially.   10.  Chronic systolic congestive heart failure: Patient appeared to be euvolemic prior to discharge.  Last 2D echo in 03/2015 showed EF of 60 to 65%.  11.  S/p TAVR   12.  Chronic kidney disease stage III: Stable.  Patient creatinine is ranged from 1.4-1.6.   Follow UP  Follow-up Information    Gaynelle Arabian, MD In 3 days.   Specialty:  Family Medicine Contact information: [address] [place] [address] [phone]            Consults obtained: None  Discharge Condition: Stable  Diet and Activity recommendation: See Discharge Instructions below  Discharge Instructions     Patient was advised to follow-up with her primary care provider on Monday to have repeat CBC and INR check as there was acute drop in her hemoglobin during her hospital stay.      Discharge Medications     Allergies as of 10/02/2018      Reactions   Darvon [propoxyphene] Nausea And Vomiting   Lisinopril Cough   Septra [sulfamethoxazole-trimethoprim] Other (See Comments)   Increased INR   Warfarin And Related Other (See Comments)   ONLY TOLERATES BRAND   Penicillins Rash      Medication List    STOP taking these medications   furosemide 40 MG tablet Commonly known as:  LASIX   glimepiride 4 MG tablet Commonly known as:  AMARYL   JANUVIA 50 MG tablet Generic drug:  sitaGLIPtin     TAKE these medications   aspirin EC 81 MG tablet Take 1 tablet (81 mg total) by mouth daily.   atorvastatin 80 MG tablet Commonly known as:  LIPITOR Take 1 tablet (80 mg total) by mouth daily.   bevacizumab 1.25 mg/0.1 mL Soln Commonly known as:  AVASTIN Place 1.25 mg into the left eye every 8 (eight) weeks.   cephALEXin 500 MG capsule Commonly known as:  KEFLEX Take 1 capsule (500 mg total) by mouth 2 (two) times daily for 5 days.   COUMADIN 5 MG tablet Generic drug:  warfarin Take as  directed. If you are unsure how to take this medication, talk to  your nurse or doctor. Original instructions:  USE AS DIRECTED BY  COUMADIN  CLINIC What changed:  See the new instructions.   digoxin 0.125 MG tablet Commonly known as:  LANOXIN Take 0.125 mg by mouth daily.   ezetimibe 10 MG tablet Commonly known as:  ZETIA TAKE 1 TABLET BY MOUTH ONCE DAILY   folic acid 845 MCG tablet Commonly known as:  FOLVITE Take 400 mcg by mouth every evening.   INVOKANA 100 MG Tabs tablet Generic drug:  canagliflozin Take 100 mg by mouth daily.   isosorbide mononitrate 60 MG 24 hr tablet Commonly known as:  IMDUR TAKE 1 TABLET BY MOUTH ONCE DAILY   losartan 50 MG tablet Commonly known as:  COZAAR Take 50 mg by mouth daily.   metoprolol tartrate 25 MG tablet Commonly known as:  LOPRESSOR Take 0.5 tablets (12.5 mg total) by mouth 2 (two) times daily.   Mometasone Furoate 100 MCG/ACT Aero Commonly known as:  ASMANEX HFA Inhale 1 puff into the lungs 2 (two) times daily.   multivitamin with minerals Tabs tablet Take 1 tablet by mouth daily.   nitroGLYCERIN 0.4 MG SL tablet Commonly known as:  NITROSTAT Place 1 tablet (0.4 mg total) under the tongue every 5 (five) minutes as needed for chest pain. Please make appt for January. 1st attempt What changed:  additional instructions   Omega-3 Fatty Acids 1200 MG Caps Take 1 capsule by mouth 2 (two) times daily.   omeprazole 20 MG capsule Commonly known as:  PRILOSEC Take 1 capsule (20 mg total) by mouth daily.   potassium chloride 10 MEQ tablet Commonly known as:  K-DUR,KLOR-CON Take 20 mEq by mouth 2 (two) times daily.   PRESERVISION AREDS 2 PO Take 2 tablets by mouth daily.   SE-TAN PLUS 162-115.2-1 MG Caps Take 1 tablet by mouth 2 (two) times daily.       Major procedures and Radiology Reports - PLEASE review detailed and final reports for all details, in brief -      Dg Chest 2 View  Result Date: 10/01/2018 CLINICAL  DATA:  Shortness of breath on exertion EXAM: CHEST - 2 VIEW COMPARISON:  06/01/2018 FINDINGS: Cardiac shadow is stable. Postsurgical changes are again noted. The lungs are well aerated bilaterally. No focal infiltrate or sizable effusion is seen. No acute bony abnormality is noted. IMPRESSION: No acute abnormality noted. Electronically Signed   By: Inez Catalina M.D.   On: 10/01/2018 11:19   Ct Head Wo Contrast  Result Date: 10/01/2018 CLINICAL DATA:  Head trauma.  Fall going to bathroom.  The EXAM: CT HEAD WITHOUT CONTRAST TECHNIQUE: Contiguous axial images were obtained from the base of the skull through the vertex without intravenous contrast. COMPARISON:  None FINDINGS: Brain: No evidence of acute infarction, hemorrhage, hydrocephalus, extra-axial collection or mass lesion/mass effect. Chronic infarct involving the left posterior parietal lobe identified, image 22/3. There is mild diffuse low-attenuation within the subcortical and periventricular white matter compatible with chronic microvascular disease. Prominence of the sulci and ventricles compatible with brain atrophy. Vascular: No hyperdense vessel or unexpected calcification. Skull: Normal. Negative for fracture or focal lesion. Sinuses/Orbits: No acute finding. Other: None. IMPRESSION: 1. No acute intracranial abnormalities. 2. Chronic small vessel ischemic disease and brain atrophy. 3. Chronic left posterior parietal infarct. Electronically Signed   By: Kerby Moors M.D.   On: 10/01/2018 11:49    Micro Results    Recent Results (from the past 240 hour(s))  Urine culture  Status: Abnormal (Preliminary result)   Collection Time: 10/01/18 12:52 PM  Result Value Ref Range Status   Specimen Description URINE, RANDOM  Final   Special Requests   Final    NONE Performed at [facility], [address], [place], [place] [address]    Culture >=100,000 COLONIES/mL ESCHERICHIA COLI (A)  Final   Report Status PENDING  Incomplete    Culture, blood (Routine X 2) w Reflex to ID Panel     Status: None (Preliminary result)   Collection Time: 10/01/18  8:25 PM  Result Value Ref Range Status   Specimen Description BLOOD LEFT ANTECUBITAL  Final   Special Requests   Final    BOTTLES DRAWN AEROBIC AND ANAEROBIC Blood Culture adequate volume Performed at [facility], [place] [address] [place], [place] [address]    Culture NO GROWTH < 24 HOURS  Final   Report Status PENDING  Incomplete  Culture, blood (Routine X 2) w Reflex to ID Panel     Status: None (Preliminary result)   Collection Time: 10/01/18  8:25 PM  Result Value Ref Range Status   Specimen Description BLOOD BLOOD LEFT HAND  Final   Special Requests   Final    BOTTLES DRAWN AEROBIC ONLY Blood Culture adequate volume Performed at [facility], [place] [address] [place], [place] [address]    Culture NO GROWTH < 24 HOURS  Final   Report Status PENDING  Incomplete  MRSA PCR Screening     Status: None   Collection Time: 10/01/18  9:17 PM  Result Value Ref Range Status   MRSA by PCR NEGATIVE NEGATIVE Final    Comment:        The GeneXpert MRSA Assay (FDA approved for NASAL specimens only), is one component of a comprehensive MRSA colonization surveillance program. It is not intended to diagnose MRSA infection nor to guide or monitor treatment for MRSA infections. Performed at [facility], [place] [address] [place], [place] [address]        Today   Subjective    Arthur Aydelotte today she reports being ready to go home.  However noted that she has had at least 1 recent.  Denies any significant abdominal pain.   Objective   Blood pressure (!) 134/54, pulse 81, temperature 99.3 F (37.4 C), resp. rate 17, height 5' (1.524 m), weight 64.7 kg, SpO2 96 %.   Intake/Output Summary (Last 24 hours) at 10/02/2018 1521 Last data filed at 10/02/2018 1423 Gross per 24 hour  Intake 2402.97 ml  Output -  Net 2402.97 ml    Exam  Constitutional:  NAD, calm, comfortable Eyes: PERRL, lids and conjunctivae normal ENMT: Mucous membranes are moist. Posterior pharynx clear of any exudate or lesions.  Neck: normal, supple, no masses, no thyromegaly Respiratory: clear to auscultation bilaterally, no wheezing, no crackles. Normal respiratory effort. No accessory muscle use.  Cardiovascular: Regular rate and rhythm, 3/6 systolic ejection murmur present.  No rubs / gallops. No extremity edema. 2+ pedal pulses. No carotid bruits.  Abdomen: no tenderness, no masses palpated. No hepatosplenomegaly. Bowel sounds positive.  Musculoskeletal: no clubbing / cyanosis. No joint deformity upper and lower extremities. Good ROM, no contractures. Normal muscle tone.  Skin: Pallor present.  No rashes, lesions, ulcers. No induration Neurologic: CN 2-12 grossly intact. Sensation intact, DTR normal. Strength 5/5 in all 4.  Psychiatric: Normal judgment and insight. Alert and oriented x 3. Normal mood.    Data Review  CBC w Diff:  Lab Results  Component Value Date   WBC 7.1 10/02/2018   HGB 8.9 (L) 10/02/2018   HCT 27.9 (L) 10/02/2018   PLT 196 10/02/2018   LYMPHOPCT 10 10/01/2018   MONOPCT 11 10/01/2018   EOSPCT 0 10/01/2018   BASOPCT 0 10/01/2018    CMP:  Lab Results  Component Value Date   NA 136 10/02/2018   K 4.3 10/02/2018   CL 108 10/02/2018   CO2 22 10/02/2018   BUN 17 10/02/2018   CREATININE 1.35 (H) 10/02/2018   CREATININE 1.60 (H) 07/05/2015   PROT 6.9 10/01/2018   ALBUMIN 3.8 10/01/2018   BILITOT 1.5 (H) 10/01/2018   ALKPHOS 41 10/01/2018   AST 36 10/01/2018   ALT 27 10/01/2018  .   Total Time in preparing paper work, data evaluation and todays exam - 35 minutes  Norval Morton M.D on 10/02/2018 at 3:21 PM  [facility]   Office  [phone]

## 2018-10-03 LAB — URINE CULTURE

## 2018-10-04 ENCOUNTER — Encounter: Payer: Self-pay | Admitting: Interventional Cardiology

## 2018-10-04 DIAGNOSIS — N390 Urinary tract infection, site not specified: Secondary | ICD-10-CM | POA: Diagnosis not present

## 2018-10-04 DIAGNOSIS — I509 Heart failure, unspecified: Secondary | ICD-10-CM | POA: Diagnosis not present

## 2018-10-04 DIAGNOSIS — Z7901 Long term (current) use of anticoagulants: Secondary | ICD-10-CM | POA: Diagnosis not present

## 2018-10-04 DIAGNOSIS — R112 Nausea with vomiting, unspecified: Secondary | ICD-10-CM | POA: Diagnosis not present

## 2018-10-04 DIAGNOSIS — E162 Hypoglycemia, unspecified: Secondary | ICD-10-CM | POA: Diagnosis not present

## 2018-10-04 DIAGNOSIS — D649 Anemia, unspecified: Secondary | ICD-10-CM | POA: Diagnosis not present

## 2018-10-04 LAB — GLUCOSE, CAPILLARY: Glucose-Capillary: 109 mg/dL — ABNORMAL HIGH (ref 70–99)

## 2018-10-04 LAB — PROTIME-INR: INR: 3 — AB (ref 0.9–1.1)

## 2018-10-05 ENCOUNTER — Telehealth: Payer: Self-pay | Admitting: Interventional Cardiology

## 2018-10-05 ENCOUNTER — Ambulatory Visit (INDEPENDENT_AMBULATORY_CARE_PROVIDER_SITE_OTHER): Payer: Medicare Other | Admitting: Cardiovascular Disease

## 2018-10-05 DIAGNOSIS — Z5181 Encounter for therapeutic drug level monitoring: Secondary | ICD-10-CM

## 2018-10-05 DIAGNOSIS — I359 Nonrheumatic aortic valve disorder, unspecified: Secondary | ICD-10-CM | POA: Diagnosis not present

## 2018-10-05 NOTE — Telephone Encounter (Signed)
Spoke with pt after receiving INR from PCP. Please refer to Anticoagulation Encounter from today.

## 2018-10-05 NOTE — Telephone Encounter (Addendum)
Returned a call to the pt and she states she was in the hospital last week and she went to see Dr. Marisue Humble and they checked her INR and it was 3.0 and she is afraid she will start having issues. Advised pt that I would reach out to Dr. Andrew Au office to obtain a copy and give her instructions once given. She gave me the number & called placed & they will fax over lab results. Pt aware I will call her back & dose INR once received.  Pt denies bleeding as reported in the phone call but states she does not want to start bleeding problems. Advised pt to report bleeding issues.

## 2018-10-05 NOTE — Telephone Encounter (Signed)
Patient would like to speak with coumadin clinic regarding bleeding issues

## 2018-10-06 LAB — CULTURE, BLOOD (ROUTINE X 2)
Culture: NO GROWTH
Culture: NO GROWTH
Special Requests: ADEQUATE
Special Requests: ADEQUATE

## 2018-10-13 DIAGNOSIS — R05 Cough: Secondary | ICD-10-CM | POA: Diagnosis not present

## 2018-10-13 DIAGNOSIS — J9801 Acute bronchospasm: Secondary | ICD-10-CM | POA: Diagnosis not present

## 2018-10-13 DIAGNOSIS — E1129 Type 2 diabetes mellitus with other diabetic kidney complication: Secondary | ICD-10-CM | POA: Diagnosis not present

## 2018-10-13 DIAGNOSIS — Z7984 Long term (current) use of oral hypoglycemic drugs: Secondary | ICD-10-CM | POA: Diagnosis not present

## 2018-10-13 DIAGNOSIS — J069 Acute upper respiratory infection, unspecified: Secondary | ICD-10-CM | POA: Diagnosis not present

## 2018-10-14 ENCOUNTER — Ambulatory Visit (INDEPENDENT_AMBULATORY_CARE_PROVIDER_SITE_OTHER): Payer: Medicare Other | Admitting: Pharmacist

## 2018-10-14 DIAGNOSIS — I359 Nonrheumatic aortic valve disorder, unspecified: Secondary | ICD-10-CM

## 2018-10-14 DIAGNOSIS — Z952 Presence of prosthetic heart valve: Secondary | ICD-10-CM

## 2018-10-14 DIAGNOSIS — Z5181 Encounter for therapeutic drug level monitoring: Secondary | ICD-10-CM | POA: Diagnosis not present

## 2018-10-14 LAB — POCT INR: INR: 1.7 — AB (ref 2.0–3.0)

## 2018-10-14 NOTE — Patient Instructions (Signed)
Description   Take 1.5 tablets today, then continue taking 1 tablet every day except 1/2 tablet on Sunday and Wednesdays.  Pt prefers 3 weeks.  Recheck INR in 2 weeks. Call our office if you have any problems or concerns [phone].

## 2018-10-23 ENCOUNTER — Other Ambulatory Visit: Payer: Self-pay | Admitting: Interventional Cardiology

## 2018-10-26 ENCOUNTER — Ambulatory Visit (INDEPENDENT_AMBULATORY_CARE_PROVIDER_SITE_OTHER): Payer: Medicare Other

## 2018-10-26 DIAGNOSIS — Z952 Presence of prosthetic heart valve: Secondary | ICD-10-CM

## 2018-10-26 DIAGNOSIS — I359 Nonrheumatic aortic valve disorder, unspecified: Secondary | ICD-10-CM | POA: Diagnosis not present

## 2018-10-26 DIAGNOSIS — Z5181 Encounter for therapeutic drug level monitoring: Secondary | ICD-10-CM

## 2018-10-26 LAB — POCT INR: INR: 2 (ref 2.0–3.0)

## 2018-10-26 NOTE — Patient Instructions (Signed)
Please continue taking 1 tablet every day except 1/2 tablet on Sunday and Wednesdays.  Pt prefers 3 weeks.  Recheck INR in 3 weeks. Call our office if you have any problems or concerns [phone].

## 2018-10-27 DIAGNOSIS — H353221 Exudative age-related macular degeneration, left eye, with active choroidal neovascularization: Secondary | ICD-10-CM | POA: Diagnosis not present

## 2018-10-27 DIAGNOSIS — E113293 Type 2 diabetes mellitus with mild nonproliferative diabetic retinopathy without macular edema, bilateral: Secondary | ICD-10-CM | POA: Diagnosis not present

## 2018-10-27 DIAGNOSIS — H35371 Puckering of macula, right eye: Secondary | ICD-10-CM | POA: Diagnosis not present

## 2018-10-27 DIAGNOSIS — H353112 Nonexudative age-related macular degeneration, right eye, intermediate dry stage: Secondary | ICD-10-CM | POA: Diagnosis not present

## 2018-11-10 ENCOUNTER — Ambulatory Visit (HOSPITAL_COMMUNITY)
Admission: RE | Admit: 2018-11-10 | Discharge: 2018-11-10 | Disposition: A | Discharge status: Home / Self Care | Payer: Medicare Other | Source: Ambulatory Visit | Attending: Internal Medicine | Admitting: Internal Medicine

## 2018-11-10 DIAGNOSIS — I6523 Occlusion and stenosis of bilateral carotid arteries: Secondary | ICD-10-CM | POA: Diagnosis not present

## 2018-11-12 ENCOUNTER — Encounter: Payer: Self-pay | Admitting: Physician Assistant

## 2018-11-13 ENCOUNTER — Other Ambulatory Visit: Payer: Self-pay | Admitting: Interventional Cardiology

## 2018-11-16 ENCOUNTER — Ambulatory Visit (INDEPENDENT_AMBULATORY_CARE_PROVIDER_SITE_OTHER): Payer: Medicare Other | Admitting: *Deleted

## 2018-11-16 DIAGNOSIS — Z952 Presence of prosthetic heart valve: Secondary | ICD-10-CM

## 2018-11-16 DIAGNOSIS — I359 Nonrheumatic aortic valve disorder, unspecified: Secondary | ICD-10-CM

## 2018-11-16 DIAGNOSIS — Z5181 Encounter for therapeutic drug level monitoring: Secondary | ICD-10-CM

## 2018-11-16 LAB — POCT INR: INR: 2.2 (ref 2.0–3.0)

## 2018-11-16 NOTE — Patient Instructions (Signed)
Description   Please continue taking 1 tablet every day except 1/2 tablet on Sunday and Wednesdays.  Pt prefers 3 weeks.  Recheck INR in 3 weeks. Call our office if you have any problems or concerns [phone].

## 2018-11-19 ENCOUNTER — Telehealth: Payer: Self-pay

## 2018-11-19 DIAGNOSIS — I6523 Occlusion and stenosis of bilateral carotid arteries: Secondary | ICD-10-CM

## 2018-11-19 NOTE — Telephone Encounter (Signed)
-----   Message from Liliane Shi, [place] sent at 11/12/2018  9:24 AM EST ----- Please call the patient. Her Carotid US shows stable bilateral plaque in the carotid arteries.  There is some plaque in the artery going into the L arm (left subclavian stenosis).  Please ask the patient if she is having any arm heaviness when she uses her left arm or gets dizzy when she uses her left arm.   Recommendations:  - Continue current medications and follow up as planned.   - Repeat Carotid US in 1 year.  - Send copy to PCP.  Richardson Dopp, PA-C    11/12/2018 9:19 AM

## 2018-11-19 NOTE — Telephone Encounter (Signed)
Reviewed results with patient who verbalized understanding.Per Richardson Dopp, PA-C to repeat Carotid in 1 year. A order has been placed in epic and a copy has been sent to pt's PCP.

## 2018-11-20 ENCOUNTER — Other Ambulatory Visit: Payer: Self-pay | Admitting: Interventional Cardiology

## 2018-11-24 ENCOUNTER — Other Ambulatory Visit: Payer: Self-pay | Admitting: Interventional Cardiology

## 2018-11-24 NOTE — Telephone Encounter (Signed)
Outpatient Medication Detail    Disp Refills Start End   isosorbide mononitrate (IMDUR) 60 MG 24 hr tablet 15 tablet 0 11/22/2018    Sig - Route: Take 1 tablet (60 mg total) by mouth daily. Please call and schedule an appt for further refills. 2nd attempt - Oral   Sent to pharmacy as: isosorbide mononitrate (IMDUR) 60 MG 24 hr tablet   E-Prescribing Status: Receipt confirmed by pharmacy (11/22/2018 5:13 PM EST)   Pharmacy   Parkin Munden, [place]

## 2018-11-27 ENCOUNTER — Inpatient Hospital Stay (HOSPITAL_COMMUNITY)
Admission: EM | Admit: 2018-11-27 | Discharge: 2018-12-02 | DRG: 871 | Disposition: A | Discharge status: Home / Self Care | Payer: Medicare Other | Attending: Internal Medicine | Admitting: Internal Medicine

## 2018-11-27 ENCOUNTER — Emergency Department (HOSPITAL_COMMUNITY): Payer: Medicare Other

## 2018-11-27 ENCOUNTER — Encounter (HOSPITAL_COMMUNITY): Payer: Self-pay | Admitting: Emergency Medicine

## 2018-11-27 DIAGNOSIS — Z880 Allergy status to penicillin: Secondary | ICD-10-CM

## 2018-11-27 DIAGNOSIS — N390 Urinary tract infection, site not specified: Secondary | ICD-10-CM | POA: Diagnosis present

## 2018-11-27 DIAGNOSIS — R509 Fever, unspecified: Secondary | ICD-10-CM | POA: Diagnosis not present

## 2018-11-27 DIAGNOSIS — R000 Tachycardia, unspecified: Secondary | ICD-10-CM | POA: Diagnosis not present

## 2018-11-27 DIAGNOSIS — E1129 Type 2 diabetes mellitus with other diabetic kidney complication: Secondary | ICD-10-CM | POA: Diagnosis present

## 2018-11-27 DIAGNOSIS — I2510 Atherosclerotic heart disease of native coronary artery without angina pectoris: Secondary | ICD-10-CM | POA: Diagnosis present

## 2018-11-27 DIAGNOSIS — A4151 Sepsis due to Escherichia coli [E. coli]: Secondary | ICD-10-CM | POA: Diagnosis not present

## 2018-11-27 DIAGNOSIS — E1122 Type 2 diabetes mellitus with diabetic chronic kidney disease: Secondary | ICD-10-CM | POA: Diagnosis present

## 2018-11-27 DIAGNOSIS — R52 Pain, unspecified: Secondary | ICD-10-CM | POA: Diagnosis not present

## 2018-11-27 DIAGNOSIS — Z7901 Long term (current) use of anticoagulants: Secondary | ICD-10-CM

## 2018-11-27 DIAGNOSIS — N3000 Acute cystitis without hematuria: Secondary | ICD-10-CM

## 2018-11-27 DIAGNOSIS — R410 Disorientation, unspecified: Secondary | ICD-10-CM | POA: Diagnosis not present

## 2018-11-27 DIAGNOSIS — N184 Chronic kidney disease, stage 4 (severe): Secondary | ICD-10-CM | POA: Diagnosis present

## 2018-11-27 DIAGNOSIS — I130 Hypertensive heart and chronic kidney disease with heart failure and stage 1 through stage 4 chronic kidney disease, or unspecified chronic kidney disease: Secondary | ICD-10-CM | POA: Diagnosis present

## 2018-11-27 DIAGNOSIS — I5032 Chronic diastolic (congestive) heart failure: Secondary | ICD-10-CM | POA: Diagnosis present

## 2018-11-27 DIAGNOSIS — E876 Hypokalemia: Secondary | ICD-10-CM | POA: Diagnosis present

## 2018-11-27 DIAGNOSIS — N183 Chronic kidney disease, stage 3 (moderate): Secondary | ICD-10-CM | POA: Diagnosis present

## 2018-11-27 DIAGNOSIS — I252 Old myocardial infarction: Secondary | ICD-10-CM

## 2018-11-27 DIAGNOSIS — Z952 Presence of prosthetic heart valve: Secondary | ICD-10-CM

## 2018-11-27 DIAGNOSIS — I5033 Acute on chronic diastolic (congestive) heart failure: Secondary | ICD-10-CM | POA: Diagnosis not present

## 2018-11-27 DIAGNOSIS — Z8249 Family history of ischemic heart disease and other diseases of the circulatory system: Secondary | ICD-10-CM

## 2018-11-27 DIAGNOSIS — Z951 Presence of aortocoronary bypass graft: Secondary | ICD-10-CM

## 2018-11-27 DIAGNOSIS — I480 Paroxysmal atrial fibrillation: Secondary | ICD-10-CM | POA: Diagnosis present

## 2018-11-27 DIAGNOSIS — Z7982 Long term (current) use of aspirin: Secondary | ICD-10-CM

## 2018-11-27 DIAGNOSIS — I959 Hypotension, unspecified: Secondary | ICD-10-CM | POA: Diagnosis not present

## 2018-11-27 DIAGNOSIS — Z87440 Personal history of urinary (tract) infections: Secondary | ICD-10-CM

## 2018-11-27 DIAGNOSIS — E785 Hyperlipidemia, unspecified: Secondary | ICD-10-CM | POA: Diagnosis not present

## 2018-11-27 DIAGNOSIS — I447 Left bundle-branch block, unspecified: Secondary | ICD-10-CM | POA: Diagnosis present

## 2018-11-27 DIAGNOSIS — K5520 Angiodysplasia of colon without hemorrhage: Secondary | ICD-10-CM | POA: Diagnosis present

## 2018-11-27 DIAGNOSIS — R197 Diarrhea, unspecified: Secondary | ICD-10-CM

## 2018-11-27 DIAGNOSIS — R112 Nausea with vomiting, unspecified: Secondary | ICD-10-CM | POA: Diagnosis present

## 2018-11-27 DIAGNOSIS — R531 Weakness: Secondary | ICD-10-CM | POA: Diagnosis not present

## 2018-11-27 DIAGNOSIS — A419 Sepsis, unspecified organism: Secondary | ICD-10-CM | POA: Diagnosis present

## 2018-11-27 DIAGNOSIS — I071 Rheumatic tricuspid insufficiency: Secondary | ICD-10-CM | POA: Diagnosis present

## 2018-11-27 LAB — CBC WITH DIFFERENTIAL/PLATELET
Abs Immature Granulocytes: 0.06 10*3/uL (ref 0.00–0.07)
Basophils Absolute: 0 10*3/uL (ref 0.0–0.1)
Basophils Relative: 0 %
Eosinophils Absolute: 0 10*3/uL (ref 0.0–0.5)
Eosinophils Relative: 0 %
HCT: 35.6 % — ABNORMAL LOW (ref 36.0–46.0)
Hemoglobin: 11.6 g/dL — ABNORMAL LOW (ref 12.0–15.0)
Immature Granulocytes: 1 %
Lymphocytes Relative: 4 %
Lymphs Abs: 0.4 10*3/uL — ABNORMAL LOW (ref 0.7–4.0)
MCH: 29 pg (ref 26.0–34.0)
MCHC: 32.6 g/dL (ref 30.0–36.0)
MCV: 89 fL (ref 80.0–100.0)
Monocytes Absolute: 0.2 10*3/uL (ref 0.1–1.0)
Monocytes Relative: 2 %
Neutro Abs: 9.9 10*3/uL — ABNORMAL HIGH (ref 1.7–7.7)
Neutrophils Relative %: 93 %
Platelets: 171 10*3/uL (ref 150–400)
RBC: 4 MIL/uL (ref 3.87–5.11)
RDW: 14.7 % (ref 11.5–15.5)
WBC: 10.5 10*3/uL (ref 4.0–10.5)
nRBC: 0 % (ref 0.0–0.2)

## 2018-11-27 LAB — LACTIC ACID, PLASMA: Lactic Acid, Venous: 2.4 mmol/L (ref 0.5–1.9)

## 2018-11-27 LAB — PROTIME-INR
INR: 2.3 — ABNORMAL HIGH (ref 0.8–1.2)
Prothrombin Time: 25 seconds — ABNORMAL HIGH (ref 11.4–15.2)

## 2018-11-27 MED ORDER — SODIUM CHLORIDE 0.9 % IV SOLN
1.0000 g | INTRAVENOUS | Status: DC
  Administered 2018-11-27 – 2018-11-29 (×2): 1 g via INTRAVENOUS
  Filled 2018-11-27 (×2): qty 10

## 2018-11-27 NOTE — ED Provider Notes (Signed)
[facility] EMERGENCY DEPARTMENT Provider Note   CSN: [id number] Arrival date & time: 11/27/18  2245    History   Chief Complaint Chief Complaint  Patient presents with  . Code Sepsis    HPI [name] is a 85 y.o. female.     Patient c/o generalized weakness and fevers, onset this evening. Symptoms acute onset, moderate, persistent. No specific exacerbating or alleviating factors. History of uti/urosepsis, ?symptoms similar. No dysuria. No abd pain or flank pain. Nausea. No vomiting. Poor po intake this evening. No headache. No chest pain or sob.   The history is provided by the patient and the EMS personnel. The history is limited by the condition of the patient.    Past Medical History:  Diagnosis Date  . Anemia   . Anginal pain ([place])   . Arteriovenous malformation of gastrointestinal tract   . Arthritis    "fingers mostly" (04/12/2015)  . Carotid artery disease ([place])    Carotid US 3/17:  72-53% RICA; 66-44% LICA; Elevated bilateral subclavian artery velocities >>f/u 1 year. // Carotid US 4/18: R 40-59; L 1-39 >> FU 1 year // Carotid US 10/2018: R 40-59; L 1-39, L subclavian stenosis   . CHF (congestive heart failure) ([place])   . Chronic kidney disease (CKD), stage III (moderate) (HCC)   . Chronic lower GI bleeding    "today; last time was ~ 8 yr ago; used to have them often before that too" (02/11/2013)  . Coronary artery disease   . DJD (degenerative joint disease)   . Dysrhythmia   . Heart murmur   . History of blood transfusion    "a few times over the years; usually related to my Coumadin" (04/12/2015  . History of gout   . Hyperlipemia   . Hypertension   . Macula lutea degeneration   . Old MI (myocardial infarction)    "a coulple /dr in 02/2008; I never even knew I'd had them" (04/12/2015)  . Type II diabetes mellitus [facility])     Patient Active Problem List   Diagnosis Date Noted  . Chronic diastolic CHF (congestive heart failure) ([place])  10/02/2018  . Fall 10/01/2018  . Syncope 10/01/2018  . UTI (urinary tract infection) 10/01/2018  . Hypoglycemia 10/01/2018  . Nausea vomiting and diarrhea 10/01/2018  . Hematochezia   . AVM (arteriovenous malformation) of small bowel, acquired   . Hemorrhoids, internal, with bleeding   . Acute kidney injury ([place])   . Anticoagulated   . Troponin level elevated   . Acute lower GI bleeding/known history AVMs 04/15/2015  . HTN (hypertension) 04/15/2015  . Hyperlipidemia 04/15/2015  . Elevated troponin 04/15/2015  . CKD (chronic kidney disease), stage III ([place]) 04/15/2015  . PAF (paroxysmal atrial fibrillation) ([place]) 04/15/2015  . GI bleed 04/15/2015  . Carotid stenosis 11/28/2013  . Encounter for therapeutic drug monitoring 11/01/2013  . Aortic valve disorder 07/12/2013  . Heart valve replaced by other means 07/12/2013  . Normocytic anemia 02/11/2013  . Chronic anticoagulation 02/11/2013  . Acute blood loss anemia 02/11/2013  . S/P AVR (aortic valve replacement) 02/11/2013  . Coronary Artery Disease 02/11/2013  . Type II diabetes mellitus with renal manifestations ([place]) 02/11/2013    Past Surgical History:  Procedure Laterality Date  . AORTIC VALVE REPLACEMENT  1993  . APPENDECTOMY  1953  . CARDIAC CATHETERIZATION  ~1990  . CARDIAC CATHETERIZATION  04/12/2015   Procedure: Coronary Stent Intervention;  Surgeon: Jettie Booze, MD;    [facility]  DES 3.5X16 to the ostial RCA   . CARDIAC CATHETERIZATION  04/12/2015   Procedure: Coronary/Graft Angiography;  Surgeon: Eloy End, MD; LAD & CFX 100%, patent LIMA-LAD, SVG-D1; SVG-OM-PDA first limb 100%, 2nd limb patent; oRCA 75%>0 w/ stent  . [place]   St[employer] Jude/notes 10/29/2003 (02/11/2013)  . COLONOSCOPY N/A 02/13/2013   Procedure: COLONOSCOPY;  Surgeon: Lafayette Dragon, MD;  Location[facility] ENDOSCOPY;  Service: Endoscopy;  Laterality: N/A;  . CORONARY ANGIOPLASTY WITH STENT PLACEMENT  2009+   "3 at least; put  in 1 stent at a time" (02/11/2013)  . CORONARY ARTERY BYPASS GRAFT  1990   LIMA-LAD, SVG-OM-PDA, SVG-D1  . DILATION AND CURETTAGE OF UTERUS  1960's   'after a miscarriage" (02/11/2013)  . ENTEROSCOPY N/A 02/13/2013   Procedure: ENTEROSCOPY;  Surgeon: Lafayette Dragon, MD;  Location: [facility] ENDOSCOPY;  Service: Endoscopy;  Laterality: N/A;  . EXPLORATORY LAPAROTOMY  02/24/2008   which revealed a retroperitoneal hematoma and bleeding from the right external iliac artery/notes 03/02/2008 (02/11/2013)   . FLEXIBLE SIGMOIDOSCOPY N/A 04/21/2015   Procedure: FLEXIBLE SIGMOIDOSCOPY;  Surgeon: Ladene Artist, MD;  Location: [facility] ENDOSCOPY;  Service: Endoscopy;  Laterality: N/A;     OB History   No obstetric history on file.      Home Medications    Prior to Admission medications   Medication Sig Start Date End Date Taking? Authorizing Provider  aspirin EC 81 MG tablet Take 1 tablet (81 mg total) by mouth daily. 04/17/16   Jettie Booze, MD  atorvastatin (LIPITOR) 80 MG tablet Take 1 tablet (80 mg total) by mouth daily. Please make appt for future refills 1st attempt. 11/15/18   Jettie Booze, MD  bevacizumab (AVASTIN) 1.25 mg/0.1 mL SOLN Place 1.25 mg into the left eye every 8 (eight) weeks.    [provider]  Canagliflozin (INVOKANA) 100 MG TABS Take 100 mg by mouth daily.     [provider]  COUMADIN 5 MG tablet USE AS DIRECTED BY  COUMADIN  CLINIC Patient taking differently: Take 2.5-5 mg by mouth See admin instructions. Take 2.5mg  by mouth on wednesdays, all other days take 5mg . 05/31/18   Jettie Booze, MD  digoxin (LANOXIN) 0.125 MG tablet Take 0.125 mg by mouth daily.    [provider]  ezetimibe (ZETIA) 10 MG tablet Take 1 tablet (10 mg total) by mouth daily. Please make appt for future refills 1st attempt. 11/15/18   Jettie Booze, MD  FeFum-FePo-FA-B Cmp-C-Zn-Mn-Cu (SE-TAN PLUS) 162-115.2-1 MG CAPS Take 1 tablet by mouth 2 (two) times daily.  05/22/15   [provider]  folic acid (FOLVITE) 938 MCG tablet Take 400 mcg by mouth every evening.     [provider]  isosorbide mononitrate (IMDUR) 60 MG 24 hr tablet Take 1 tablet (60 mg total) by mouth daily. Please call and schedule an appt for further refills. 2nd attempt 11/22/18   Jettie Booze, MD  losartan (COZAAR) 50 MG tablet Take 50 mg by mouth daily.    [provider]  metoprolol tartrate (LOPRESSOR) 25 MG tablet Take 0.5 tablets (12.5 mg total) by mouth 2 (two) times daily. 05/03/15   Kathlen Mody, Scott T, PA-C  Mometasone Furoate [facility] HFA) 100 MCG/ACT AERO Inhale 1 puff into the lungs 2 (two) times daily. Patient not taking: Reported on 10/01/2018 08/24/18   Garner Nash, DO  Multiple Vitamin (MULTIVITAMIN WITH MINERALS) TABS Take 1 tablet by mouth daily.  [provider]  Multiple Vitamins-Minerals (PRESERVISION AREDS 2 PO) Take 2 tablets by mouth daily.    [provider]  nitroGLYCERIN (NITROSTAT) 0.4 MG SL tablet Place 1 tablet (0.4 mg total) under the tongue every 5 (five) minutes as needed for chest pain. Please make appt for January. 1st attempt Patient taking differently: Place 0.4 mg under the tongue every 5 (five) minutes as needed for chest pain.  09/07/17   Jettie Booze, MD  Omega-3 Fatty Acids 1200 MG CAPS Take 1 capsule by mouth 2 (two) times daily.     [provider]  omeprazole (PRILOSEC) 20 MG capsule Take 1 capsule (20 mg total) by mouth daily. 07/06/18   Icard, Leory Plowman L, DO  potassium chloride (K-DUR,KLOR-CON) 10 MEQ tablet Take 20 mEq by mouth 2 (two) times daily.     [provider]    Family History Family History  Problem Relation Age of Onset  . Heart disease Mother   . Heart disease Father     Social History Social History   Tobacco Use  . Smoking status: Never Smoker  . Smokeless tobacco: Never Used  Substance Use Topics  . Alcohol use: No  . Drug use: No      Allergies   Darvon [propoxyphene]; Lisinopril; Septra [sulfamethoxazole-trimethoprim]; Warfarin and related; and Penicillins   Review of Systems Review of Systems  Constitutional: Positive for chills and fever.  HENT: Negative for sore throat.   Eyes: Negative for redness.  Respiratory: Negative for cough and shortness of breath.   Cardiovascular: Negative for chest pain.  Gastrointestinal: Positive for nausea. Negative for abdominal pain, diarrhea and vomiting.  Endocrine: Negative for polyuria.  Genitourinary: Negative for dysuria and flank pain.  Musculoskeletal: Negative for back pain and neck pain.  Skin: Negative for rash.  Neurological: Positive for weakness. Negative for headaches.  Hematological: Does not bruise/bleed easily.  Psychiatric/Behavioral: Positive for confusion.     Physical Exam Updated Vital Signs Temp (!) 104.1 F (40.1 C) (Rectal)   Ht 1.524 m (5')   Wt 64.7 kg   SpO2 98% Comment: w/2L  BMI 27.86 kg/m   Physical Exam Vitals signs and nursing note reviewed.  Constitutional:      Appearance: She is well-developed.     Comments: Febrile. Generally weak appearing.   HENT:     Head: Atraumatic.     Nose: Nose normal.     Mouth/Throat:     Mouth: Mucous membranes are moist.  Eyes:     General: No scleral icterus.    Conjunctiva/sclera: Conjunctivae normal.     Pupils: Pupils are equal, round, and reactive to light.  Neck:     Musculoskeletal: Normal range of motion and neck supple. No neck rigidity or muscular tenderness.     Trachea: No tracheal deviation.  Cardiovascular:     Rate and Rhythm: Regular rhythm. Tachycardia present.     Pulses: Normal pulses.     Heart sounds: Normal heart sounds. No murmur. No friction rub. No gallop.   Pulmonary:     Effort: Pulmonary effort is normal. No respiratory distress.     Breath sounds: Normal breath sounds.  Abdominal:     General: Bowel sounds are normal. There is no distension.      Palpations: Abdomen is soft. There is no mass.     Tenderness: There is no abdominal tenderness. There is no guarding or rebound.     Hernia: No hernia is present.  Genitourinary:  Comments: No cva tenderness.  Musculoskeletal:        General: No swelling.  Skin:    General: Skin is warm and dry.     Findings: No rash.  Neurological:     Mental Status: She is alert.     Comments: Slow to respond. Motor/sens grossly intact bil.   Psychiatric:     Comments: Ill appearing.       ED Treatments / Results  Labs (all labs ordered are listed, but only abnormal results are displayed) Results for orders placed or performed during the hospital encounter of 11/27/18  CBC WITH DIFFERENTIAL  Result Value Ref Range   WBC 10.5 4.0 - 10.5 K/uL   RBC 4.00 3.87 - 5.11 MIL/uL   Hemoglobin 11.6 (L) 12.0 - 15.0 g/dL   HCT 35.6 (L) 36.0 - 46.0 %   MCV 89.0 80.0 - 100.0 fL   MCH 29.0 26.0 - 34.0 pg   MCHC 32.6 30.0 - 36.0 g/dL   RDW 14.7 11.5 - 15.5 %   Platelets 171 150 - 400 K/uL   nRBC 0.0 0.0 - 0.2 %   Neutrophils Relative % 93 %   Neutro Abs 9.9 (H) 1.7 - 7.7 K/uL   Lymphocytes Relative 4 %   Lymphs Abs 0.4 (L) 0.7 - 4.0 K/uL   Monocytes Relative 2 %   Monocytes Absolute 0.2 0.1 - 1.0 K/uL   Eosinophils Relative 0 %   Eosinophils Absolute 0.0 0.0 - 0.5 K/uL   Basophils Relative 0 %   Basophils Absolute 0.0 0.0 - 0.1 K/uL   Immature Granulocytes 1 %   Abs Immature Granulocytes 0.06 0.00 - 0.07 K/uL   Dg Chest Port 1 View  Result Date: 11/27/2018 CLINICAL DATA:  The patient feels cold.  Vomiting.  Fever. EXAM: PORTABLE CHEST 1 VIEW COMPARISON:  October 01, 2018 FINDINGS: The heart size and mediastinal contours are within normal limits. Both lungs are clear. The visualized skeletal structures are unremarkable. IMPRESSION: No active disease. Electronically Signed   By: Dorise Bullion III M.D   On: 11/27/2018 23:25   Vas US Carotid  Result Date: 11/11/2018 Carotid Arterial Duplex  Study Indications:       Known carotid artery stenosis. Risk Factors:      Hypertension, hyperlipidemia, Diabetes, no history of                    smoking, coronary artery disease. Comparison Study:  Previous carotid duplex performed in 8/19 showed the right                    ICA with velocities of 263/47 cm/sec and the left ICA with                    velocities of 111/12 cm/sec. Performing Technologist: Mariane Masters RVT  Examination Guidelines: A complete evaluation includes B-mode imaging, spectral Doppler, color Doppler, and power Doppler as needed of all accessible portions of each vessel. Bilateral testing is considered an integral part of a complete examination. Limited examinations for reoccurring indications may be performed as noted.  Right Carotid Findings: +----------+--------+--------+--------+-------------------------+--------------+           PSV cm/sEDV cm/sStenosisDescribe                 Comments       +----------+--------+--------+--------+-------------------------+--------------+ CCA Prox  78      1                                                       +----------+--------+--------+--------+-------------------------+--------------+  CCA Distal52      9                                                       +----------+--------+--------+--------+-------------------------+--------------+ ICA Prox  226     40      40-59%  irregular and                                                             heterogenous                            +----------+--------+--------+--------+-------------------------+--------------+ ICA Mid   158     29                                       turbulent flow +----------+--------+--------+--------+-------------------------+--------------+ ICA Distal196     36                                       tortuous       +----------+--------+--------+--------+-------------------------+--------------+ ECA       94      0                                                        +----------+--------+--------+--------+-------------------------+--------------+ +----------+--------+-------+----------------+-------------------+           PSV cm/sEDV cmsDescribe        Arm Pressure (mmHG) +----------+--------+-------+----------------+-------------------+ Subclavian120            Multiphasic, UYQ034                 +----------+--------+-------+----------------+-------------------+ +---------+--------+--+--------+--+---------+ VertebralPSV cm/s67EDV cm/s11Antegrade +---------+--------+--+--------+--+---------+  Left Carotid Findings: +----------+--------+--------+--------+------------------------+--------+           PSV cm/sEDV cm/sStenosisDescribe                Comments +----------+--------+--------+--------+------------------------+--------+ CCA Prox  100     0                                                +----------+--------+--------+--------+------------------------+--------+ CCA Distal50      9                                                +----------+--------+--------+--------+------------------------+--------+ ICA Prox  108     13      1-39%   hyperechoic and calcific         +----------+--------+--------+--------+------------------------+--------+ ICA Mid   95      20                                               +----------+--------+--------+--------+------------------------+--------+  ICA Distal128     27                                               +----------+--------+--------+--------+------------------------+--------+ ECA       112     1                                                +----------+--------+--------+--------+------------------------+--------+ +----------+--------+--------+--------+-------------------+ SubclavianPSV cm/sEDV cm/sDescribeArm Pressure (mmHG) +----------+--------+--------+--------+-------------------+           359              Stenotic110                 +----------+--------+--------+--------+-------------------+ +---------+--------+--+--------+-+---------+ VertebralPSV cm/s48EDV cm/s8Antegrade +---------+--------+--+--------+-+---------+  Summary: Right Carotid: Velocities in the right ICA are consistent with a 40-59%                stenosis. Left Carotid: Velocities in the left ICA are consistent with a 1-39% stenosis. Vertebrals:  Bilateral vertebral arteries demonstrate antegrade flow. Subclavians: Left subclavian artery was stenotic. Normal flow hemodynamics were              seen in the right subclavian artery. *See table(s) above for measurements and observations. Suggest follow up study in 12 months. Electronically signed by Carlyle Dolly MD on 11/11/2018 at 4:33:50 PM.    Final     EKG EKG Interpretation  Date/Time:  Saturday November 27 2018 22:58:05 EST Ventricular Rate:  93 PR Interval:    QRS Duration: 123 QT Interval:  350 QTC Calculation: 436 R Axis:   -59 Text Interpretation:  Ectopic atrial rhythm Left bundle branch block No significant change since last tracing Confirmed by Lajean Saver [phone]) on 11/27/2018 11:01:31 PM   Radiology Dg Chest Port 1 View  Result Date: 11/27/2018 CLINICAL DATA:  The patient feels cold.  Vomiting.  Fever. EXAM: PORTABLE CHEST 1 VIEW COMPARISON:  October 01, 2018 FINDINGS: The heart size and mediastinal contours are within normal limits. Both lungs are clear. The visualized skeletal structures are unremarkable. IMPRESSION: No active disease. Electronically Signed   By: Dorise Bullion III M.D   On: 11/27/2018 23:25    Procedures Procedures (including critical care time)  Medications Ordered in ED Medications  cefTRIAXone (ROCEPHIN) 1 g in sodium chloride 0.9 % 100 mL IVPB (has no administration in time range)     Initial Impression / Assessment and Plan / ED Course  I have reviewed the triage vital signs and the nursing notes.  Pertinent labs & imaging  results that were available during my care of the patient were reviewed by me and considered in my medical decision making (see chart for details).  Iv ns bolus. Code sepsis activation. Stat labs and cultures sent. Urine and urine culture sent.   Iv abx.   Reviewed nursing notes and prior charts for additional history.   cxr reviewed - no pna.   Lab reviewed - wbc sl elev, 10.5. UA and chemistries pending.   Chemistries pending/lactate pending/ua pending - signed out to Dr Leonides Schanz to check labs when resulted, likely uti, admit.      Final Clinical Impressions(s) / ED Diagnoses   Final diagnoses:  None    ED Discharge  Orders    None       Lajean Saver, MD 11/27/18 2358

## 2018-11-27 NOTE — ED Triage Notes (Signed)
Brought by ems from home for c/o being cold.  Per spouse came home to find patient was cold.  Wrapped her in a blanket then placed a heating pad on her when she was still cold.  Patient drowsy in triage.  Recent hx of urosepsis.

## 2018-11-28 ENCOUNTER — Other Ambulatory Visit: Payer: Self-pay

## 2018-11-28 DIAGNOSIS — R197 Diarrhea, unspecified: Secondary | ICD-10-CM

## 2018-11-28 DIAGNOSIS — I447 Left bundle-branch block, unspecified: Secondary | ICD-10-CM | POA: Diagnosis present

## 2018-11-28 DIAGNOSIS — R112 Nausea with vomiting, unspecified: Secondary | ICD-10-CM

## 2018-11-28 DIAGNOSIS — Z8249 Family history of ischemic heart disease and other diseases of the circulatory system: Secondary | ICD-10-CM | POA: Diagnosis not present

## 2018-11-28 DIAGNOSIS — E1122 Type 2 diabetes mellitus with diabetic chronic kidney disease: Secondary | ICD-10-CM | POA: Diagnosis present

## 2018-11-28 DIAGNOSIS — A419 Sepsis, unspecified organism: Secondary | ICD-10-CM | POA: Diagnosis not present

## 2018-11-28 DIAGNOSIS — Z87440 Personal history of urinary (tract) infections: Secondary | ICD-10-CM | POA: Diagnosis not present

## 2018-11-28 DIAGNOSIS — Z7982 Long term (current) use of aspirin: Secondary | ICD-10-CM | POA: Diagnosis not present

## 2018-11-28 DIAGNOSIS — I2583 Coronary atherosclerosis due to lipid rich plaque: Secondary | ICD-10-CM

## 2018-11-28 DIAGNOSIS — I5032 Chronic diastolic (congestive) heart failure: Secondary | ICD-10-CM | POA: Diagnosis not present

## 2018-11-28 DIAGNOSIS — E876 Hypokalemia: Secondary | ICD-10-CM | POA: Diagnosis present

## 2018-11-28 DIAGNOSIS — I5031 Acute diastolic (congestive) heart failure: Secondary | ICD-10-CM | POA: Diagnosis not present

## 2018-11-28 DIAGNOSIS — A4151 Sepsis due to Escherichia coli [E. coli]: Secondary | ICD-10-CM | POA: Diagnosis present

## 2018-11-28 DIAGNOSIS — N183 Chronic kidney disease, stage 3 (moderate): Secondary | ICD-10-CM | POA: Diagnosis present

## 2018-11-28 DIAGNOSIS — I480 Paroxysmal atrial fibrillation: Secondary | ICD-10-CM | POA: Diagnosis present

## 2018-11-28 DIAGNOSIS — R7881 Bacteremia: Secondary | ICD-10-CM | POA: Diagnosis not present

## 2018-11-28 DIAGNOSIS — Z952 Presence of prosthetic heart valve: Secondary | ICD-10-CM | POA: Diagnosis not present

## 2018-11-28 DIAGNOSIS — I361 Nonrheumatic tricuspid (valve) insufficiency: Secondary | ICD-10-CM | POA: Diagnosis not present

## 2018-11-28 DIAGNOSIS — N390 Urinary tract infection, site not specified: Secondary | ICD-10-CM | POA: Diagnosis present

## 2018-11-28 DIAGNOSIS — I5033 Acute on chronic diastolic (congestive) heart failure: Secondary | ICD-10-CM | POA: Diagnosis not present

## 2018-11-28 DIAGNOSIS — I071 Rheumatic tricuspid insufficiency: Secondary | ICD-10-CM | POA: Diagnosis present

## 2018-11-28 DIAGNOSIS — I2510 Atherosclerotic heart disease of native coronary artery without angina pectoris: Secondary | ICD-10-CM | POA: Diagnosis not present

## 2018-11-28 DIAGNOSIS — N3000 Acute cystitis without hematuria: Secondary | ICD-10-CM | POA: Diagnosis not present

## 2018-11-28 DIAGNOSIS — K5520 Angiodysplasia of colon without hemorrhage: Secondary | ICD-10-CM | POA: Diagnosis not present

## 2018-11-28 DIAGNOSIS — Z7901 Long term (current) use of anticoagulants: Secondary | ICD-10-CM | POA: Diagnosis not present

## 2018-11-28 DIAGNOSIS — Z951 Presence of aortocoronary bypass graft: Secondary | ICD-10-CM | POA: Diagnosis not present

## 2018-11-28 DIAGNOSIS — I351 Nonrheumatic aortic (valve) insufficiency: Secondary | ICD-10-CM | POA: Diagnosis not present

## 2018-11-28 DIAGNOSIS — Z880 Allergy status to penicillin: Secondary | ICD-10-CM | POA: Diagnosis not present

## 2018-11-28 DIAGNOSIS — I130 Hypertensive heart and chronic kidney disease with heart failure and stage 1 through stage 4 chronic kidney disease, or unspecified chronic kidney disease: Secondary | ICD-10-CM | POA: Diagnosis present

## 2018-11-28 DIAGNOSIS — I252 Old myocardial infarction: Secondary | ICD-10-CM | POA: Diagnosis not present

## 2018-11-28 DIAGNOSIS — E785 Hyperlipidemia, unspecified: Secondary | ICD-10-CM | POA: Diagnosis present

## 2018-11-28 DIAGNOSIS — I340 Nonrheumatic mitral (valve) insufficiency: Secondary | ICD-10-CM | POA: Diagnosis not present

## 2018-11-28 LAB — COMPREHENSIVE METABOLIC PANEL
ALBUMIN: 3.8 g/dL (ref 3.5–5.0)
ALT: 31 U/L (ref 0–44)
ALT: 26 U/L (ref 0–44)
ALT: 27 U/L (ref 0–44)
AST: 37 U/L (ref 15–41)
AST: 30 U/L (ref 15–41)
AST: 32 U/L (ref 15–41)
Albumin: 3.2 g/dL — ABNORMAL LOW (ref 3.5–5.0)
Albumin: 3.1 g/dL — ABNORMAL LOW (ref 3.5–5.0)
Alkaline Phosphatase: 78 U/L (ref 38–126)
Alkaline Phosphatase: 63 U/L (ref 38–126)
Alkaline Phosphatase: 71 U/L (ref 38–126)
Anion gap: 9 (ref 5–15)
Anion gap: 9 (ref 5–15)
Anion gap: 8 (ref 5–15)
BILIRUBIN TOTAL: 1.4 mg/dL — AB (ref 0.3–1.2)
BUN: 29 mg/dL — ABNORMAL HIGH (ref 8–23)
BUN: 28 mg/dL — ABNORMAL HIGH (ref 8–23)
BUN: 26 mg/dL — ABNORMAL HIGH (ref 8–23)
CHLORIDE: 107 mmol/L (ref 98–111)
CO2: 21 mmol/L — AB (ref 22–32)
CO2: 20 mmol/L — ABNORMAL LOW (ref 22–32)
CO2: 19 mmol/L — AB (ref 22–32)
CREATININE: 1.35 mg/dL — AB (ref 0.44–1.00)
Calcium: 8.9 mg/dL (ref 8.9–10.3)
Calcium: 8 mg/dL — ABNORMAL LOW (ref 8.9–10.3)
Calcium: 8.6 mg/dL — ABNORMAL LOW (ref 8.9–10.3)
Chloride: 100 mmol/L (ref 98–111)
Chloride: 107 mmol/L (ref 98–111)
Creatinine, Ser: 1.62 mg/dL — ABNORMAL HIGH (ref 0.44–1.00)
Creatinine, Ser: 1.51 mg/dL — ABNORMAL HIGH (ref 0.44–1.00)
GFR calc Af Amer: 34 mL/min — ABNORMAL LOW (ref >60)
GFR calc Af Amer: 37 mL/min — ABNORMAL LOW (ref >60)
GFR calc Af Amer: 42 mL/min — ABNORMAL LOW (ref >60)
GFR calc non Af Amer: 29 mL/min — ABNORMAL LOW (ref >60)
GFR calc non Af Amer: 32 mL/min — ABNORMAL LOW (ref >60)
GFR calc non Af Amer: 36 mL/min — ABNORMAL LOW (ref >60)
Glucose, Bld: 183 mg/dL — ABNORMAL HIGH (ref 70–99)
Glucose, Bld: 208 mg/dL — ABNORMAL HIGH (ref 70–99)
Glucose, Bld: 226 mg/dL — ABNORMAL HIGH (ref 70–99)
Potassium: 3.7 mmol/L (ref 3.5–5.1)
Potassium: 4.1 mmol/L (ref 3.5–5.1)
Potassium: 4.6 mmol/L (ref 3.5–5.1)
Sodium: 130 mmol/L — ABNORMAL LOW (ref 135–145)
Sodium: 136 mmol/L (ref 135–145)
Sodium: 134 mmol/L — ABNORMAL LOW (ref 135–145)
TOTAL PROTEIN: 5.9 g/dL — AB (ref 6.5–8.1)
Total Bilirubin: 1.3 mg/dL — ABNORMAL HIGH (ref 0.3–1.2)
Total Bilirubin: 1.3 mg/dL — ABNORMAL HIGH (ref 0.3–1.2)
Total Protein: 6.8 g/dL (ref 6.5–8.1)
Total Protein: 6 g/dL — ABNORMAL LOW (ref 6.5–8.1)

## 2018-11-28 LAB — GLUCOSE, CAPILLARY
Glucose-Capillary: 175 mg/dL — ABNORMAL HIGH (ref 70–99)
Glucose-Capillary: 225 mg/dL — ABNORMAL HIGH (ref 70–99)
Glucose-Capillary: 195 mg/dL — ABNORMAL HIGH (ref 70–99)
Glucose-Capillary: 189 mg/dL — ABNORMAL HIGH (ref 70–99)

## 2018-11-28 LAB — CBC
HCT: 32.1 % — ABNORMAL LOW (ref 36.0–46.0)
Hemoglobin: 10.3 g/dL — ABNORMAL LOW (ref 12.0–15.0)
MCH: 28.8 pg (ref 26.0–34.0)
MCHC: 32.1 g/dL (ref 30.0–36.0)
MCV: 89.7 fL (ref 80.0–100.0)
Platelets: 154 10*3/uL (ref 150–400)
RBC: 3.58 MIL/uL — ABNORMAL LOW (ref 3.87–5.11)
RDW: 14.7 % (ref 11.5–15.5)
WBC: 16.1 10*3/uL — AB (ref 4.0–10.5)
nRBC: 0 % (ref 0.0–0.2)

## 2018-11-28 LAB — LACTIC ACID, PLASMA: LACTIC ACID, VENOUS: 1.8 mmol/L (ref 0.5–1.9)

## 2018-11-28 LAB — BLOOD CULTURE ID PANEL (REFLEXED)
Acinetobacter baumannii: NOT DETECTED
CANDIDA KRUSEI: NOT DETECTED
Candida albicans: NOT DETECTED
Candida glabrata: NOT DETECTED
Candida parapsilosis: NOT DETECTED
Candida tropicalis: NOT DETECTED
Carbapenem resistance: NOT DETECTED
Enterobacter cloacae complex: NOT DETECTED
Enterobacteriaceae species: DETECTED — AB
Enterococcus species: NOT DETECTED
Escherichia coli: DETECTED — AB
Haemophilus influenzae: NOT DETECTED
Klebsiella oxytoca: NOT DETECTED
Klebsiella pneumoniae: NOT DETECTED
Listeria monocytogenes: NOT DETECTED
Neisseria meningitidis: NOT DETECTED
Proteus species: NOT DETECTED
Pseudomonas aeruginosa: NOT DETECTED
STAPHYLOCOCCUS AUREUS BCID: NOT DETECTED
STREPTOCOCCUS PYOGENES: NOT DETECTED
Serratia marcescens: NOT DETECTED
Staphylococcus species: NOT DETECTED
Streptococcus agalactiae: NOT DETECTED
Streptococcus pneumoniae: NOT DETECTED
Streptococcus species: NOT DETECTED

## 2018-11-28 LAB — URINALYSIS, ROUTINE W REFLEX MICROSCOPIC
Bilirubin Urine: NEGATIVE
Glucose, UA: >=500 mg/dL — AB
Ketones, ur: NEGATIVE mg/dL
Nitrite: POSITIVE — AB
Protein, ur: 30 mg/dL — AB
Specific Gravity, Urine: 1.014 (ref 1.005–1.030)
WBC, UA: >50 WBC/hpf — ABNORMAL HIGH (ref 0–5)
pH: 6 (ref 5.0–8.0)

## 2018-11-28 LAB — MRSA PCR SCREENING: MRSA by PCR: NEGATIVE

## 2018-11-28 LAB — DIGOXIN LEVEL: Digoxin Level: 0.9 ng/mL (ref 0.8–2.0)

## 2018-11-28 MED ORDER — DIGOXIN 125 MCG PO TABS
0.1250 mg | ORAL_TABLET | Freq: Every day | ORAL | Status: DC
  Administered 2018-11-28 – 2018-12-02 (×5): 0.125 mg via ORAL
  Filled 2018-11-28 (×5): qty 1

## 2018-11-28 MED ORDER — ISOSORBIDE MONONITRATE ER 60 MG PO TB24
60.0000 mg | ORAL_TABLET | Freq: Every day | ORAL | Status: DC
  Administered 2018-11-28 – 2018-12-02 (×5): 60 mg via ORAL
  Filled 2018-11-28 (×5): qty 1

## 2018-11-28 MED ORDER — WARFARIN SODIUM 2.5 MG PO TABS
2.5000 mg | ORAL_TABLET | Freq: Once | ORAL | Status: AC
  Administered 2018-11-28: 2.5 mg via ORAL
  Filled 2018-11-28: qty 1

## 2018-11-28 MED ORDER — ACETAMINOPHEN 500 MG PO TABS
1000.0000 mg | ORAL_TABLET | Freq: Once | ORAL | Status: AC
  Administered 2018-11-28: 1000 mg via ORAL
  Filled 2018-11-28: qty 2

## 2018-11-28 MED ORDER — NITROGLYCERIN 0.4 MG SL SUBL: 0.4000 mg | SUBLINGUAL_TABLET | SUBLINGUAL | Status: DC | PRN

## 2018-11-28 MED ORDER — SODIUM CHLORIDE 0.9 % IV BOLUS
1000.0000 mL | Freq: Once | INTRAVENOUS | Status: AC
  Administered 2018-11-28: 1000 mL via INTRAVENOUS

## 2018-11-28 MED ORDER — ONDANSETRON HCL 4 MG PO TABS: 4.0000 mg | ORAL_TABLET | Freq: Four times a day (QID) | ORAL | Status: DC | PRN

## 2018-11-28 MED ORDER — SODIUM CHLORIDE 0.9 % IV SOLN
INTRAVENOUS | Status: DC
  Administered 2018-11-28: 03:00:00 via INTRAVENOUS

## 2018-11-28 MED ORDER — ONDANSETRON HCL 4 MG/2ML IJ SOLN: 4.0000 mg | Freq: Four times a day (QID) | INTRAMUSCULAR | Status: DC | PRN

## 2018-11-28 MED ORDER — SODIUM CHLORIDE 0.9 % IV SOLN: 1.0000 g | INTRAVENOUS | Status: DC

## 2018-11-28 MED ORDER — INSULIN ASPART 100 UNIT/ML ~~LOC~~ SOLN: 0.0000 [IU] | Freq: Every day | SUBCUTANEOUS | Status: DC

## 2018-11-28 MED ORDER — OMEGA-3-ACID ETHYL ESTERS 1 G PO CAPS
1.0000 | ORAL_CAPSULE | Freq: Two times a day (BID) | ORAL | Status: DC
  Administered 2018-11-28 – 2018-12-02 (×9): 1 g via ORAL
  Filled 2018-11-28 (×9): qty 1

## 2018-11-28 MED ORDER — ATORVASTATIN CALCIUM 80 MG PO TABS
80.0000 mg | ORAL_TABLET | Freq: Every day | ORAL | Status: DC
  Administered 2018-11-28 – 2018-12-02 (×5): 80 mg via ORAL
  Filled 2018-11-28 (×5): qty 1

## 2018-11-28 MED ORDER — CANAGLIFLOZIN 100 MG PO TABS: 100.0000 mg | ORAL_TABLET | Freq: Every day | ORAL | Status: DC

## 2018-11-28 MED ORDER — PROSIGHT PO TABS
1.0000 | ORAL_TABLET | Freq: Every day | ORAL | Status: DC
  Administered 2018-11-28 – 2018-12-02 (×5): 1 via ORAL
  Filled 2018-11-28 (×5): qty 1

## 2018-11-28 MED ORDER — LOSARTAN POTASSIUM 50 MG PO TABS
50.0000 mg | ORAL_TABLET | Freq: Every day | ORAL | Status: DC
  Administered 2018-11-28 – 2018-12-02 (×5): 50 mg via ORAL
  Filled 2018-11-28 (×5): qty 1

## 2018-11-28 MED ORDER — ADULT MULTIVITAMIN W/MINERALS CH
1.0000 | ORAL_TABLET | Freq: Every day | ORAL | Status: DC
  Administered 2018-11-28 – 2018-12-02 (×5): 1 via ORAL
  Filled 2018-11-28 (×4): qty 1

## 2018-11-28 MED ORDER — POTASSIUM CHLORIDE CRYS ER 20 MEQ PO TBCR
20.0000 meq | EXTENDED_RELEASE_TABLET | Freq: Two times a day (BID) | ORAL | Status: DC
  Administered 2018-11-28 – 2018-12-02 (×10): 20 meq via ORAL
  Filled 2018-11-28 (×8): qty 1
  Filled 2018-11-28: qty 2
  Filled 2018-11-28: qty 1

## 2018-11-28 MED ORDER — PANTOPRAZOLE SODIUM 40 MG PO TBEC
40.0000 mg | DELAYED_RELEASE_TABLET | Freq: Every day | ORAL | Status: DC
  Administered 2018-11-28 – 2018-12-02 (×5): 40 mg via ORAL
  Filled 2018-11-28: qty 1
  Filled 2018-11-28: qty 2
  Filled 2018-11-28 (×3): qty 1

## 2018-11-28 MED ORDER — WARFARIN - PHARMACIST DOSING INPATIENT
Freq: Every day | Status: DC
  Administered 2018-11-28 – 2018-11-30 (×2)

## 2018-11-28 MED ORDER — ASPIRIN EC 81 MG PO TBEC
81.0000 mg | DELAYED_RELEASE_TABLET | Freq: Every day | ORAL | Status: DC
  Administered 2018-11-28 – 2018-12-02 (×5): 81 mg via ORAL
  Filled 2018-11-28 (×5): qty 1

## 2018-11-28 MED ORDER — INSULIN ASPART 100 UNIT/ML ~~LOC~~ SOLN
0.0000 [IU] | Freq: Three times a day (TID) | SUBCUTANEOUS | Status: DC
  Administered 2018-11-28: 3 [IU] via SUBCUTANEOUS
  Administered 2018-11-28 – 2018-11-29 (×3): 2 [IU] via SUBCUTANEOUS
  Administered 2018-11-29: 3 [IU] via SUBCUTANEOUS
  Administered 2018-11-29 – 2018-11-30 (×4): 1 [IU] via SUBCUTANEOUS
  Administered 2018-12-01: 2 [IU] via SUBCUTANEOUS
  Administered 2018-12-01 (×2): 1 [IU] via SUBCUTANEOUS
  Administered 2018-12-02: 3 [IU] via SUBCUTANEOUS
  Administered 2018-12-02: 2 [IU] via SUBCUTANEOUS

## 2018-11-28 MED ORDER — EZETIMIBE 10 MG PO TABS
10.0000 mg | ORAL_TABLET | Freq: Every day | ORAL | Status: DC
  Administered 2018-11-28 – 2018-12-02 (×5): 10 mg via ORAL
  Filled 2018-11-28 (×5): qty 1

## 2018-11-28 MED ORDER — METOPROLOL TARTRATE 12.5 MG HALF TABLET
12.5000 mg | ORAL_TABLET | Freq: Two times a day (BID) | ORAL | Status: DC
  Administered 2018-11-28 – 2018-12-02 (×9): 12.5 mg via ORAL
  Filled 2018-11-28 (×9): qty 1

## 2018-11-28 NOTE — Progress Notes (Signed)
[facility]  [name] a 85 y.o.femalewith medical history significant ofhypertension, hyperlipidemia, diabetes mellitus, GERD, gout, CKD stage III, anemia, GI bleeding, CHF, CKD 3, CAD, stent placement, CABG, aortic valve replacement with a prosthetic valve, atrial fibrillation on Coumadin. She presents with nausea and vomiting and is found to have a UTI. She had a similar presentation on 10/01/18.   Principal Problem:   Sepsis due to UTI- fever, leukocytosis - cont Ceftriaxone- last grew out pan sensitive e coli - d/c Invokana as it predisposes to recurrent UTIs  Active Problems: Nausea and vomiting- due to above - follow  Elevated T bili - ? Was elevated in Jan as well but AST, ALT, Alk phos normal - follow    S/P AVR (aortic valve replacement)    Coronary Artery Disease -cont statin, Aspirin and Coumadin    Type II diabetes mellitus with renal manifestations  - d/c Invokana- start Metformin when dischaged - cont SSI    CKD (chronic kidney disease), stage III  - stable  HTN - resumed Imdur, Metoprolol, Losartan    PAF (paroxysmal atrial fibrillation)  - Coumadin, Metoprolol and Digoxin  Renee Odea, MD

## 2018-11-28 NOTE — ED Provider Notes (Signed)
12:00 AM  Assumed care from Dr. Ashok Cordia.  Patient is an 85 year old female with history of urosepsis in January who presents today with fever of 104, generalized weakness and nausea.  No URI symptoms today.  Concern for repeat UTI.  She has received IV ceftriaxone 1 L of IV fluids.  Chest x-ray clear.  Labs, urine pending.  Patient will need admission.  Previous urine culture grew E. coli that was pansensitive.  1:35 AM  Pt remains hemodynamically stable.  Urine appears infected.  Urine culture has been ordered.  Blood cultures pending.  Will admit to medicine.  PCP with [facility] physicians.  1:43 AM Discussed patient's case with hospitalist, Dr. Jonelle Sidle.  I have recommended admission and patient (and family if present) agree with this plan. Admitting physician will place admission orders.   I reviewed all nursing notes, vitals, pertinent previous records, EKGs, lab and urine results, imaging (as available).     Derinda Bartus, Delice Bison, DO 11/28/18 [phone]

## 2018-11-28 NOTE — ED Notes (Signed)
Care hand off given to [facility], [place]

## 2018-11-28 NOTE — ED Notes (Signed)
MD made aware of dropping BP and MAP.

## 2018-11-28 NOTE — Progress Notes (Signed)
ANTICOAGULATION CONSULT NOTE - Initial Consult  Pharmacy Consult for coumadin Indication: atrial fibrillation  Allergies  Allergen Reactions  . Darvon [Propoxyphene] Nausea And Vomiting  . Lisinopril Cough  . Septra [Sulfamethoxazole-Trimethoprim] Other (See Comments)    Increased INR  . Warfarin And Related Other (See Comments)    ONLY TOLERATES BRAND  . Penicillins Rash    Patient Measurements: Height: 5' (152.4 cm) Weight: 142 lb 10.2 oz (64.7 kg) IBW/kg (Calculated) : 45.5   Vital Signs: Temp: 99.8 F (37.7 C) (03/08 0305) Temp Source: Oral (03/08 0305) BP: 99/42 (03/08 0305) Pulse Rate: 88 (03/08 0305)  Labs: Recent Labs    11/27/18 2255  HGB 11.6*  HCT 35.6*  PLT 171  LABPROT 25.0*  INR 2.3*  CREATININE 1.62*    Estimated Creatinine Clearance: 22.1 mL/min (A) (by C-G formula based on SCr of 1.62 mg/dL (H)).   Medical History: Past Medical History:  Diagnosis Date  . Anemia   . Anginal pain ([place])   . Arteriovenous malformation of gastrointestinal tract   . Arthritis    "fingers mostly" (04/12/2015)  . Carotid artery disease ([place])    Carotid US 3/17:  37-62% RICA; 83-15% LICA; Elevated bilateral subclavian artery velocities >>f/u 1 year. // Carotid US 4/18: R 40-59; L 1-39 >> FU 1 year // Carotid US 10/2018: R 40-59; L 1-39, L subclavian stenosis   . CHF (congestive heart failure) ([place])   . Chronic kidney disease (CKD), stage III (moderate) (HCC)   . Chronic lower GI bleeding    "today; last time was ~ 8 yr ago; used to have them often before that too" (02/11/2013)  . Coronary artery disease   . DJD (degenerative joint disease)   . Dysrhythmia   . Heart murmur   . History of blood transfusion    "a few times over the years; usually related to my Coumadin" (04/12/2015  . History of gout   . Hyperlipemia   . Hypertension   . Macula lutea degeneration   . Old MI (myocardial infarction)    "a coulple /dr in 02/2008; I never even knew I'd had them"  (04/12/2015)  . Type II diabetes mellitus (HCC)     Medications:  See medication history  Assessment: 85 yo lady to continue coumadin for afib.. INR 2.3.  Hg 11.6, PTLC 171 Goal of Therapy:  INR 2-2.5 per MD Monitor platelets by anticoagulation protocol: Yes   Plan:  F/u home coumadin dose Daily PT/INR  Jaquitta Dupriest Poteet 11/28/2018,3:12 AM

## 2018-11-28 NOTE — Progress Notes (Signed)
ANTICOAGULATION CONSULT NOTE - Initial Consult  Pharmacy Consult for coumadin Indication: atrial fibrillation  Allergies  Allergen Reactions  . Darvon [Propoxyphene] Nausea And Vomiting  . Lisinopril Cough  . Septra [Sulfamethoxazole-Trimethoprim] Other (See Comments)    Increased INR  . Warfarin And Related Other (See Comments)    ONLY TOLERATES BRAND  . Penicillins Rash    Patient Measurements: Height: 5' (152.4 cm) Weight: 137 lb 2 oz (62.2 kg) IBW/kg (Calculated) : 45.5   Vital Signs: Temp: 98 F (36.7 C) (03/08 1145) Temp Source: Oral (03/08 1145) BP: 115/60 (03/08 1145) Pulse Rate: 95 (03/08 1145)  Labs: Recent Labs    11/27/18 2255 11/28/18 0335  HGB 11.6* 10.3*  HCT 35.6* 32.1*  PLT 171 154  LABPROT 25.0*  --   INR 2.3*  --   CREATININE 1.62* 1.51*    Estimated Creatinine Clearance: 23.3 mL/min (A) (by C-G formula based on SCr of 1.51 mg/dL (H)).   Medical History: Past Medical History:  Diagnosis Date  . Anemia   . Anginal pain ([place])   . Arteriovenous malformation of gastrointestinal tract   . Arthritis    "fingers mostly" (04/12/2015)  . Carotid artery disease ([place])    Carotid US 3/17:  78-67% RICA; 67-20% LICA; Elevated bilateral subclavian artery velocities >>f/u 1 year. // Carotid US 4/18: R 40-59; L 1-39 >> FU 1 year // Carotid US 10/2018: R 40-59; L 1-39, L subclavian stenosis   . CHF (congestive heart failure) ([place])   . Chronic kidney disease (CKD), stage III (moderate) (HCC)   . Chronic lower GI bleeding    "today; last time was ~ 8 yr ago; used to have them often before that too" (02/11/2013)  . Coronary artery disease   . DJD (degenerative joint disease)   . Dysrhythmia   . Heart murmur   . History of blood transfusion    "a few times over the years; usually related to my Coumadin" (04/12/2015  . History of gout   . Hyperlipemia   . Hypertension   . Macula lutea degeneration   . Old MI (myocardial infarction)    "a coulple /dr in  02/2008; I never even knew I'd had them" (04/12/2015)  . Type II diabetes mellitus (HCC)     Medications:  See medication history  Assessment: 85 yo F admitted for nausea, vomiting, and generalized weakness to continue coumadin for afib.. INR 2.3.  Hg 11.6, PTLC 171. Last coumadin dose taken on 3/6.   PTA coumadin regimen: 2.5mg  on Sun/Wed, all other days 5mg .  Goal of Therapy:  INR 2-2.5 per MD Monitor platelets by anticoagulation protocol: Yes   Plan:  Coumadin 2.5 mg x1 Daily PT/INR, s/s bleeding  Harrietta Guardian, PharmD PGY1 Pharmacy Resident 11/28/2018    12:18 PM Please check AMION for all [place] numbers

## 2018-11-28 NOTE — H&P (Signed)
History and Physical   [name] ZGY:[id number] DOB: 08-Apr-1933 DOA: 11/27/2018  Referring MD/NP/PA: Dr. Leonides Schanz  PCP: Gaynelle Arabian, MD   Outpatient Specialists: None  Patient coming from: Home  Chief Complaint: Nausea vomiting, generalized weakness  HPI: [name] is a 85 y.o. female with medical history significant of atrial fibrillation, previous GI bleed, diabetes, hypertension, aortic valve replacement on chronic anticoagulation, chronic kidney disease stage III with recent urosepsis in January of this year who came in complaining of generalized weakness in the last 2 days with nausea and vomiting today.  She has some dysuria and general malaise.  Patient used to have frequent UTIs years ago but has not had it in many years.  This year however she has had 2 episodes already.  She appears to have recurrence of her UTI.  She had subjective fever at home and was noted to have a temperature 104 here in the ER.  Also elevated lactic acid level.  Patient is therefore be readmitted to the hospital for the management of sepsis secondary to UTI.  She denied any flank pain.  Denied any hematemesis..  ED Course: Temperature is 104.1 with blood pressure initially 92/42 pulse 92 respiratory rate of 30 oxygen sat 94% room air.  The white count is 10.5 hemoglobin 11.6 and platelet 171.  Sodium is 130 potassium 3.7 chloride 100 CO2 21 BUN 29 and creatinine 1.62.  INR is 2.3 glucose 183 and lactic acid 2.4.  Patient was given IV hydration with IV Rocephin and is being admitted to the hospital.  Urinalysis is consistent with UTI showing moderate leukocytes with positive nitrite many bacteria and WBC is more than 50.  Review of Systems: As per HPI otherwise 10 point review of systems negative.    Past Medical History:  Diagnosis Date  . Anemia   . Anginal pain ([place])   . Arteriovenous malformation of gastrointestinal tract   . Arthritis    "fingers mostly" (04/12/2015)  . Carotid artery disease  ([place])    Carotid US 3/17:  59-16% RICA; 38-46% LICA; Elevated bilateral subclavian artery velocities >>f/u 1 year. // Carotid US 4/18: R 40-59; L 1-39 >> FU 1 year // Carotid US 10/2018: R 40-59; L 1-39, L subclavian stenosis   . CHF (congestive heart failure) ([place])   . Chronic kidney disease (CKD), stage III (moderate) (HCC)   . Chronic lower GI bleeding    "today; last time was ~ 8 yr ago; used to have them often before that too" (02/11/2013)  . Coronary artery disease   . DJD (degenerative joint disease)   . Dysrhythmia   . Heart murmur   . History of blood transfusion    "a few times over the years; usually related to my Coumadin" (04/12/2015  . History of gout   . Hyperlipemia   . Hypertension   . Macula lutea degeneration   . Old MI (myocardial infarction)    "a coulple /dr in 02/2008; I never even knew I'd had them" (04/12/2015)  . Type II diabetes mellitus ([place])     Past Surgical History:  Procedure Laterality Date  . AORTIC VALVE REPLACEMENT  1993  . APPENDECTOMY  1953  . CARDIAC CATHETERIZATION  ~1990  . CARDIAC CATHETERIZATION  04/12/2015   Procedure: Coronary Stent Intervention;  Surgeon: Jettie Booze, MD;    SYNERGY DES 3.5X16 to the ostial RCA   . CARDIAC CATHETERIZATION  04/12/2015   Procedure: Coronary/Graft Angiography;  Surgeon: Eloy End, MD;  LAD & CFX 100%, patent LIMA-LAD, SVG-D1; SVG-OM-PDA first limb 100%, 2nd limb patent; oRCA 75%>0 w/ stent  . [place]   St. Jude/notes 10/29/2003 (02/11/2013)  . COLONOSCOPY N/A 02/13/2013   Procedure: COLONOSCOPY;  Surgeon: Lafayette Dragon, MD;  Location: [facility] ENDOSCOPY;  Service: Endoscopy;  Laterality: N/A;  . CORONARY ANGIOPLASTY WITH STENT PLACEMENT  2009+   "3 at least; put in 1 stent at a time" (02/11/2013)  . CORONARY ARTERY BYPASS GRAFT  1990   LIMA-LAD, SVG-OM-PDA, SVG-D1  . DILATION AND CURETTAGE OF UTERUS  1960's   'after a miscarriage" (02/11/2013)  . ENTEROSCOPY N/A 02/13/2013    Procedure: ENTEROSCOPY;  Surgeon: Lafayette Dragon, MD;  Location: [facility] ENDOSCOPY;  Service: Endoscopy;  Laterality: N/A;  . EXPLORATORY LAPAROTOMY  02/24/2008   which revealed a retroperitoneal hematoma and bleeding from the right external iliac artery/notes 03/02/2008 (02/11/2013)   . FLEXIBLE SIGMOIDOSCOPY N/A 04/21/2015   Procedure: FLEXIBLE SIGMOIDOSCOPY;  Surgeon: Ladene Artist, MD;  Location: [facility] ENDOSCOPY;  Service: Endoscopy;  Laterality: N/A;     reports that she has never smoked. She has never used smokeless tobacco. She reports that she does not drink alcohol or use drugs.  Allergies  Allergen Reactions  . Darvon [Propoxyphene] Nausea And Vomiting  . Lisinopril Cough  . Septra [Sulfamethoxazole-Trimethoprim] Other (See Comments)    Increased INR  . Warfarin And Related Other (See Comments)    ONLY TOLERATES BRAND  . Penicillins Rash    Family History  Problem Relation Age of Onset  . Heart disease Mother   . Heart disease Father      Prior to Admission medications   Medication Sig Start Date End Date Taking? Authorizing Provider  aspirin EC 81 MG tablet Take 1 tablet (81 mg total) by mouth daily. 04/17/16   Jettie Booze, MD  atorvastatin (LIPITOR) 80 MG tablet Take 1 tablet (80 mg total) by mouth daily. Please make appt for future refills 1st attempt. 11/15/18   Jettie Booze, MD  bevacizumab (AVASTIN) 1.25 mg/0.1 mL SOLN Place 1.25 mg into the left eye every 8 (eight) weeks.    [provider]  Canagliflozin (INVOKANA) 100 MG TABS Take 100 mg by mouth daily.     [provider]  COUMADIN 5 MG tablet USE AS DIRECTED BY  COUMADIN  CLINIC Patient taking differently: Take 2.5-5 mg by mouth See admin instructions. Take 2.5mg  by mouth on wednesdays, all other days take 5mg . 05/31/18   Jettie Booze, MD  digoxin (LANOXIN) 0.125 MG tablet Take 0.125 mg by mouth daily.    [provider]  ezetimibe (ZETIA) 10 MG tablet Take 1 tablet (10 mg  total) by mouth daily. Please make appt for future refills 1st attempt. 11/15/18   Jettie Booze, MD  FeFum-FePo-FA-B Cmp-C-Zn-Mn-Cu (SE-TAN PLUS) 162-115.2-1 MG CAPS Take 1 tablet by mouth 2 (two) times daily. 05/22/15   [provider]  folic acid (FOLVITE) 027 MCG tablet Take 400 mcg by mouth every evening.     [provider]  isosorbide mononitrate (IMDUR) 60 MG 24 hr tablet Take 1 tablet (60 mg total) by mouth daily. Please call and schedule an appt for further refills. 2nd attempt 11/22/18   Jettie Booze, MD  losartan (COZAAR) 50 MG tablet Take 50 mg by mouth daily.    [provider]  metoprolol tartrate (LOPRESSOR) 25 MG tablet Take 0.5 tablets (12.5 mg total) by mouth 2 (two) times  daily. 05/03/15   Richardson Dopp T, PA-C  Mometasone Furoate [facility] HFA) 100 MCG/ACT AERO Inhale 1 puff into the lungs 2 (two) times daily. Patient not taking: Reported on 10/01/2018 08/24/18   Garner Nash, DO  Multiple Vitamin (MULTIVITAMIN WITH MINERALS) TABS Take 1 tablet by mouth daily.    [provider]  Multiple Vitamins-Minerals (PRESERVISION AREDS 2 PO) Take 2 tablets by mouth daily.    [provider]  nitroGLYCERIN (NITROSTAT) 0.4 MG SL tablet Place 1 tablet (0.4 mg total) under the tongue every 5 (five) minutes as needed for chest pain. Please make appt for January. 1st attempt Patient taking differently: Place 0.4 mg under the tongue every 5 (five) minutes as needed for chest pain.  09/07/17   Jettie Booze, MD  Omega-3 Fatty Acids 1200 MG CAPS Take 1 capsule by mouth 2 (two) times daily.     [provider]  omeprazole (PRILOSEC) 20 MG capsule Take 1 capsule (20 mg total) by mouth daily. 07/06/18   Icard, Leory Plowman L, DO  potassium chloride (K-DUR,KLOR-CON) 10 MEQ tablet Take 20 mEq by mouth 2 (two) times daily.     [provider]    Physical Exam: Vitals:   11/28/18 0120 11/28/18 0130 11/28/18 0156 11/28/18 0305    BP:  (!) 111/46  (!) 99/42  Pulse: 91 90 87 88  Resp: 18 20 (!) 25 (!) 25  Temp:    99.8 F (37.7 C)  TempSrc:    Oral  SpO2: 100% 100% 98% 98%  Weight:      Height:          Constitutional: NAD, calm, comfortable, frail and weak Vitals:   11/28/18 0120 11/28/18 0130 11/28/18 0156 11/28/18 0305  BP:  (!) 111/46  (!) 99/42  Pulse: 91 90 87 88  Resp: 18 20 (!) 25 (!) 25  Temp:    99.8 F (37.7 C)  TempSrc:    Oral  SpO2: 100% 100% 98% 98%  Weight:      Height:       Eyes: PERRL, lids and conjunctivae normal ENMT: Mucous membranes are dry. Posterior pharynx clear of any exudate or lesions.Normal dentition.  Neck: normal, supple, no masses, no thyromegaly Respiratory: clear to auscultation bilaterally, no wheezing, no crackles. Normal respiratory effort. No accessory muscle use.  Cardiovascular: Regular rate and rhythm, + systolic ejection murmurs / rubs / gallops. No extremity edema. 2+ pedal pulses. No carotid bruits.  Abdomen: no tenderness, no masses palpated. No hepatosplenomegaly. Bowel sounds positive.  Musculoskeletal: no clubbing / cyanosis. No joint deformity upper and lower extremities. Good ROM, no contractures. Normal muscle tone.  Skin: no rashes, lesions, ulcers. No induration Neurologic: CN 2-12 grossly intact. Sensation intact, DTR normal. Strength 5/5 in all 4.  Psychiatric: Normal judgment and insight. Alert and oriented x 3. Normal mood.     Labs on Admission: I have personally reviewed following labs and imaging studies  CBC: Recent Labs  Lab 11/27/18 2255  WBC 10.5  NEUTROABS 9.9*  HGB 11.6*  HCT 35.6*  MCV 89.0  PLT 097   Basic Metabolic Panel: Recent Labs  Lab 11/27/18 2255  NA 130*  K 3.7  CL 100  CO2 21*  GLUCOSE 183*  BUN 29*  CREATININE 1.62*  CALCIUM 8.9   GFR: Estimated Creatinine Clearance: 22.1 mL/min (A) (by C-G formula based on SCr of 1.62 mg/dL (H)). Liver Function Tests: Recent Labs  Lab 11/27/18 2255  AST 37  ALT 31  ALKPHOS 78  BILITOT 1.3*  PROT 6.8  ALBUMIN 3.8   No results for input(s): LIPASE, AMYLASE in the last 168 hours. No results for input(s): AMMONIA in the last 168 hours. Coagulation Profile: Recent Labs  Lab 11/27/18 2255  INR 2.3*   Cardiac Enzymes: No results for input(s): CKTOTAL, CKMB, CKMBINDEX, TROPONINI in the last 168 hours. BNP (last 3 results) No results for input(s): PROBNP in the last 8760 hours. HbA1C: No results for input(s): HGBA1C in the last 72 hours. CBG: No results for input(s): GLUCAP in the last 168 hours. Lipid Profile: No results for input(s): CHOL, HDL, LDLCALC, TRIG, CHOLHDL, LDLDIRECT in the last 72 hours. Thyroid Function Tests: No results for input(s): TSH, T4TOTAL, FREET4, T3FREE, THYROIDAB in the last 72 hours. Anemia Panel: No results for input(s): VITAMINB12, FOLATE, FERRITIN, TIBC, IRON, RETICCTPCT in the last 72 hours. Urine analysis:    Component Value Date/Time   COLORURINE YELLOW 11/28/2018 0058   APPEARANCEUR HAZY (A) 11/28/2018 0058   LABSPEC 1.014 11/28/2018 0058   PHURINE 6.0 11/28/2018 0058   GLUCOSEU >=500 (A) 11/28/2018 0058   HGBUR MODERATE (A) 11/28/2018 0058   BILIRUBINUR NEGATIVE 11/28/2018 0058   KETONESUR NEGATIVE 11/28/2018 0058   PROTEINUR 30 (A) 11/28/2018 0058   UROBILINOGEN 0.2 04/20/2008 2135   NITRITE POSITIVE (A) 11/28/2018 0058   LEUKOCYTESUR MODERATE (A) 11/28/2018 0058   Sepsis Labs: @LABRCNTIP (procalcitonin:4,lacticidven:4) )No results found for this or any previous visit (from the past 240 hour(s)).   Radiological Exams on Admission: Dg Chest Port 1 View  Result Date: 11/27/2018 CLINICAL DATA:  The patient feels cold.  Vomiting.  Fever. EXAM: PORTABLE CHEST 1 VIEW COMPARISON:  October 01, 2018 FINDINGS: The heart size and mediastinal contours are within normal limits. Both lungs are clear. The visualized skeletal structures are unremarkable. IMPRESSION: No active disease. Electronically Signed    By: Dorise Bullion III M.D   On: 11/27/2018 23:25    EKG: Independently reviewed.  It shows ectopic atrial rhythm with left bundle branch block  Assessment/Plan Principal Problem:   Sepsis ([place]) Active Problems:   S/P AVR (aortic valve replacement)   Coronary Artery Disease   Type II diabetes mellitus with renal manifestations (HCC)   CKD (chronic kidney disease), stage III (HCC)   PAF (paroxysmal atrial fibrillation) (HCC)   AVM (arteriovenous malformation) of small bowel, acquired   UTI (urinary tract infection)   Nausea vomiting and diarrhea   Chronic diastolic CHF (congestive heart failure) ([place])     #1 sepsis: Patient meets the SIRS criteria plus evidence of urinary tract infection.  She has no organ dysfunction however.  We will admit her to medical bed.  Hydrate patient aggressively.  Initiate IV Rocephin which she tolerated despite penicillin allergy.  Blood and urine cultures obtained and will follow results.  #2 urinary tract infection: Appears to be recurrent now.  Patient may need to be on suppressive therapy or referral to urology at discharge.  #3 nausea vomiting: Most likely due to the UTI.  Patient's I suspect she will have some pyelonephritis.  Continue antibiotics and supportive care.  #4 chronic diastolic CHF: Appears compensated.  Continue to monitor.  #5 diabetes: Sliding scale insulin with home regimen.  #6 hypertension: Continue blood pressure control.  #7 paroxysmal atrial fibrillation: Rate is controlled.  We will get pharmacy to consult on warfarin dosing.  Patient apparently has had recurrent GI bleed in the past when her INR is above 2.5.  We will  aim at keeping it between 2.0 and 2.5 especially with antibiotics on board.    DVT prophylaxis: Warfarin Code Status: Full code Family Communication: Husband at bedside Disposition Plan: To be determined Consults called: None Admission status: Inpatient  Severity of Illness: The appropriate patient  status for this patient is INPATIENT. Inpatient status is judged to be reasonable and necessary in order to provide the required intensity of service to ensure the patient's safety. The patient's presenting symptoms, physical exam findings, and initial radiographic and laboratory data in the context of their chronic comorbidities is felt to place them at high risk for further clinical deterioration. Furthermore, it is not anticipated that the patient will be medically stable for discharge from the hospital within 2 midnights of admission. The following factors support the patient status of inpatient.   " The patient's presenting symptoms include weakness with nausea and vomiting. " The worrisome physical exam findings include frail. " The initial radiographic and laboratory data are worrisome because of urinalysis consistent with UTI. " The chronic co-morbidities include recurrent UTI with diabetes and hypertension.   * I certify that at the point of admission it is my clinical judgment that the patient will require inpatient hospital care spanning beyond 2 midnights from the point of admission due to high intensity of service, high risk for further deterioration and high frequency of surveillance required.Barbette Merino MD [facility] Pager [phone]  If 7PM-7AM, please contact night-coverage [email or web address] Password [email or web address]  11/28/2018, 3:06 AM

## 2018-11-28 NOTE — Progress Notes (Signed)
MD, pt takes lasix 40mg  po 1x day at home, thanks [place] [place].

## 2018-11-29 DIAGNOSIS — N3000 Acute cystitis without hematuria: Secondary | ICD-10-CM

## 2018-11-29 DIAGNOSIS — R7881 Bacteremia: Secondary | ICD-10-CM

## 2018-11-29 DIAGNOSIS — I5031 Acute diastolic (congestive) heart failure: Secondary | ICD-10-CM

## 2018-11-29 LAB — COMPREHENSIVE METABOLIC PANEL
ALK PHOS: 83 U/L (ref 38–126)
ALT: 25 U/L (ref 0–44)
AST: 34 U/L (ref 15–41)
Albumin: 3 g/dL — ABNORMAL LOW (ref 3.5–5.0)
Anion gap: 7 (ref 5–15)
BUN: 26 mg/dL — AB (ref 8–23)
CO2: 22 mmol/L (ref 22–32)
Calcium: 8.8 mg/dL — ABNORMAL LOW (ref 8.9–10.3)
Chloride: 108 mmol/L (ref 98–111)
Creatinine, Ser: 1.41 mg/dL — ABNORMAL HIGH (ref 0.44–1.00)
GFR calc Af Amer: 40 mL/min — ABNORMAL LOW (ref >60)
GFR calc non Af Amer: 34 mL/min — ABNORMAL LOW (ref >60)
Glucose, Bld: 166 mg/dL — ABNORMAL HIGH (ref 70–99)
Potassium: 4.8 mmol/L (ref 3.5–5.1)
Sodium: 137 mmol/L (ref 135–145)
Total Bilirubin: 0.9 mg/dL (ref 0.3–1.2)
Total Protein: 6.2 g/dL — ABNORMAL LOW (ref 6.5–8.1)

## 2018-11-29 LAB — GLUCOSE, CAPILLARY
Glucose-Capillary: 158 mg/dL — ABNORMAL HIGH (ref 70–99)
Glucose-Capillary: 203 mg/dL — ABNORMAL HIGH (ref 70–99)
Glucose-Capillary: 141 mg/dL — ABNORMAL HIGH (ref 70–99)
Glucose-Capillary: 151 mg/dL — ABNORMAL HIGH (ref 70–99)

## 2018-11-29 LAB — CBC
HEMATOCRIT: 35.8 % — AB (ref 36.0–46.0)
Hemoglobin: 11.1 g/dL — ABNORMAL LOW (ref 12.0–15.0)
MCH: 28.3 pg (ref 26.0–34.0)
MCHC: 31 g/dL (ref 30.0–36.0)
MCV: 91.3 fL (ref 80.0–100.0)
NRBC: 0 % (ref 0.0–0.2)
Platelets: 168 10*3/uL (ref 150–400)
RBC: 3.92 MIL/uL (ref 3.87–5.11)
RDW: 15.4 % (ref 11.5–15.5)
WBC: 23.2 10*3/uL — ABNORMAL HIGH (ref 4.0–10.5)

## 2018-11-29 LAB — PROTIME-INR
INR: 2.7 — ABNORMAL HIGH (ref 0.8–1.2)
PROTHROMBIN TIME: 27.9 s — AB (ref 11.4–15.2)

## 2018-11-29 MED ORDER — SODIUM CHLORIDE 0.9 % IV SOLN
2.0000 g | INTRAVENOUS | Status: DC
  Administered 2018-11-29: 2 g via INTRAVENOUS
  Filled 2018-11-29: qty 20

## 2018-11-29 MED ORDER — IPRATROPIUM-ALBUTEROL 0.5-2.5 (3) MG/3ML IN SOLN: 3.0000 mL | RESPIRATORY_TRACT | Status: DC | PRN

## 2018-11-29 MED ORDER — FUROSEMIDE 10 MG/ML IJ SOLN: 40.0000 mg | Freq: Once | INTRAMUSCULAR | Status: DC

## 2018-11-29 MED ORDER — FUROSEMIDE 10 MG/ML IJ SOLN
40.0000 mg | INTRAMUSCULAR | Status: AC
  Administered 2018-11-29: 40 mg via INTRAVENOUS
  Filled 2018-11-29: qty 4

## 2018-11-29 MED ORDER — WARFARIN SODIUM 2.5 MG PO TABS
2.5000 mg | ORAL_TABLET | Freq: Once | ORAL | Status: AC
  Administered 2018-11-29: 2.5 mg via ORAL
  Filled 2018-11-29: qty 1

## 2018-11-29 MED ORDER — SODIUM CHLORIDE 0.9 % IV SOLN
INTRAVENOUS | Status: DC | PRN
  Administered 2018-11-29 – 2018-12-01 (×3): 250 mL via INTRAVENOUS

## 2018-11-29 NOTE — Progress Notes (Signed)
Oncoming RN received report from day shift RN.  Day shift RN stated that physician called and stated it was okay for patient to not have the bed alarm on and she could move independently.  Patient also requesting not to use bed alarm.

## 2018-11-29 NOTE — Progress Notes (Signed)
PROGRESS NOTE    [name]   UUV:[id number]  DOB: [birth year]  DOA: 11/27/2018 PCP: Gaynelle Arabian, MD   Brief Narrative:  [name]  is a 85 y.o.femalewith medical history significant ofhypertension, hyperlipidemia, diabetes mellitus, GERD, gout, CKD stage III, anemia, GI bleeding, CHF, CKD 3, CAD, stent placement, CABG, aortic valve replacement with a prosthetic valve, atrial fibrillation on Coumadin. She presents with nausea and vomiting and is found to have a UTI. She had a similar presentation on 10/01/18.    Subjective: Patient wants to go home today. I have convinced her to stay another day.     Assessment & Plan:  Principal Problem:   Sepsis due to UTI and e coli bacteremia - fever, leukocytosis - cont Ceftriaxone- last grew out pan sensitive e coli from urine culture- awaiting sensitivities  - d/c Invokana as it predisposes to recurrent UTIs- d/w patient  - WBC up today- fevers resolved  Active Problems: Nausea and vomiting- due to above - improved- cont diet  Acute d CHF - IVF stopped last night - will give a dose of IV Lasix 40 mg and resume home oral Lasix  Elevated T bili - ? Was elevated in Jan as well but AST, ALT, Alk phos normal - has improved and was likely secondary to sepsis    S/P AVR (aortic valve replacement) - mechanical valve- INR within range    Coronary Artery Disease -cont statin, Aspirin and Coumadin    Type II diabetes mellitus with renal manifestations  - d/c Invokana- start Metformin when discharged-  d/w patient - cont SSI    CKD (chronic kidney disease), stage III  - stable  HTN - resumed Imdur, Metoprolol, Losartan    PAF (paroxysmal atrial fibrillation)  - Coumadin, Metoprolol and Digoxin    Time spent in minutes: 35 DVT prophylaxis: Coumadin Code Status: Full code Family Communication: no family at bedside Disposition Plan: home in 1-2 days Consultants:   none Procedures:   none Antimicrobials:   Anti-infectives (From admission, onward)   Start     Dose/Rate Route Frequency Ordered Stop   11/29/18 2300  cefTRIAXone (ROCEPHIN) 2 g in sodium chloride 0.9 % 100 mL IVPB     2 g 200 mL/hr over 30 Minutes Intravenous Every 24 hours 11/29/18 1017     11/28/18 0315  cefTRIAXone (ROCEPHIN) 1 g in sodium chloride 0.9 % 100 mL IVPB  Status:  Discontinued     1 g 200 mL/hr over 30 Minutes Intravenous Every 24 hours 11/28/18 0309 11/28/18 0507   11/27/18 2300  cefTRIAXone (ROCEPHIN) 1 g in sodium chloride 0.9 % 100 mL IVPB  Status:  Discontinued     1 g 200 mL/hr over 30 Minutes Intravenous Every 24 hours 11/27/18 2251 11/29/18 1017       Objective: Vitals:   11/28/18 1956 11/29/18 0000 11/29/18 0451 11/29/18 0923  BP: (!) 140/57 (!) 112/52 (!) 151/67 (!) 160/74  Pulse: 87 79 82 97  Resp: '20 20 20   ' Temp: 97.9 F (36.6 C) 98 F (36.7 C)    TempSrc: Oral Oral Oral   SpO2: 98% 95% 98% 95%  Weight:   61.8 kg   Height:        Intake/Output Summary (Last 24 hours) at 11/29/2018 1200 Last data filed at 11/29/2018 0929 Gross per 24 hour  Intake 460 ml  Output 1701 ml  Net -1241 ml   Filed Weights   11/27/18 2252 11/28/18 0509 11/29/18 0451  Weight: 64.7 kg 62.2 kg 61.8 kg    Examination: General exam: Appears comfortable  HEENT: PERRLA, oral mucosa moist, no sclera icterus or thrush Respiratory system: Clear to auscultation. Breathing rapidly after coming back to bed from using commode Cardiovascular system: S1 & S2 heard,  No murmurs  Gastrointestinal system: Abdomen soft, non-tender, nondistended. Normal bowel sounds   Central nervous system: Alert and oriented. No focal neurological deficits. Extremities: No cyanosis, clubbing or edema Skin: No rashes or ulcers Psychiatry:  Mood & affect appropriate.  Data Reviewed: I have personally reviewed following labs and imaging studies  CBC: Recent Labs  Lab 11/27/18 2255 11/28/18 0335 11/29/18 0528  WBC 10.5 16.1* 23.2*    NEUTROABS 9.9*  --   --   HGB 11.6* 10.3* 11.1*  HCT 35.6* 32.1* 35.8*  MCV 89.0 89.7 91.3  PLT 171 154 778   Basic Metabolic Panel: Recent Labs  Lab 11/27/18 2255 11/28/18 0335 11/28/18 1250 11/29/18 0528  NA 130* 136 134* 137  K 3.7 4.1 4.6 4.8  CL 100 107 107 108  CO2 21* 20* 19* 22  GLUCOSE 183* 208* 226* 166*  BUN 29* 28* 26* 26*  CREATININE 1.62* 1.51* 1.35* 1.41*  CALCIUM 8.9 8.0* 8.6* 8.8*   GFR: Estimated Creatinine Clearance: 24.8 mL/min (A) (by C-G formula based on SCr of 1.41 mg/dL (H)). Liver Function Tests: Recent Labs  Lab 11/27/18 2255 11/28/18 0335 11/28/18 1250 11/29/18 0528  AST 37 30 32 34  ALT '31 26 27 25  ' ALKPHOS 78 63 71 83  BILITOT 1.3* 1.4* 1.3* 0.9  PROT 6.8 5.9* 6.0* 6.2*  ALBUMIN 3.8 3.2* 3.1* 3.0*   No results for input(s): LIPASE, AMYLASE in the last 168 hours. No results for input(s): AMMONIA in the last 168 hours. Coagulation Profile: Recent Labs  Lab 11/27/18 2255 11/29/18 0528  INR 2.3* 2.7*   Cardiac Enzymes: No results for input(s): CKTOTAL, CKMB, CKMBINDEX, TROPONINI in the last 168 hours. BNP (last 3 results) No results for input(s): PROBNP in the last 8760 hours. HbA1C: No results for input(s): HGBA1C in the last 72 hours. CBG: Recent Labs  Lab 11/28/18 1142 11/28/18 1652 11/28/18 2144 11/29/18 0623 11/29/18 1111  GLUCAP 225* 195* 189* 158* 203*   Lipid Profile: No results for input(s): CHOL, HDL, LDLCALC, TRIG, CHOLHDL, LDLDIRECT in the last 72 hours. Thyroid Function Tests: No results for input(s): TSH, T4TOTAL, FREET4, T3FREE, THYROIDAB in the last 72 hours. Anemia Panel: No results for input(s): VITAMINB12, FOLATE, FERRITIN, TIBC, IRON, RETICCTPCT in the last 72 hours. Urine analysis:    Component Value Date/Time   COLORURINE YELLOW 11/28/2018 0058   APPEARANCEUR HAZY (A) 11/28/2018 0058   LABSPEC 1.014 11/28/2018 0058   PHURINE 6.0 11/28/2018 0058   GLUCOSEU >=500 (A) 11/28/2018 0058   HGBUR  MODERATE (A) 11/28/2018 0058   BILIRUBINUR NEGATIVE 11/28/2018 0058   KETONESUR NEGATIVE 11/28/2018 0058   PROTEINUR 30 (A) 11/28/2018 0058   UROBILINOGEN 0.2 04/20/2008 2135   NITRITE POSITIVE (A) 11/28/2018 0058   LEUKOCYTESUR MODERATE (A) 11/28/2018 0058   Sepsis Labs: '@LABRCNTIP' (procalcitonin:4,lacticidven:4) ) Recent Results (from the past 240 hour(s))  Blood Culture (routine x 2)     Status: None (Preliminary result)   Collection Time: 11/27/18 10:51 PM  Result Value Ref Range Status   Specimen Description BLOOD RIGHT FOREARM  Final   Special Requests   Final    BOTTLES DRAWN AEROBIC AND ANAEROBIC Blood Culture results may not be optimal due to an inadequate  volume of blood received in culture bottles   Culture  Setup Time   Final    GRAM NEGATIVE RODS AEROBIC BOTTLE ONLY CRITICAL VALUE NOTED.  VALUE IS CONSISTENT WITH PREVIOUSLY REPORTED AND CALLED VALUE.    Culture   Final    GRAM NEGATIVE RODS IDENTIFICATION TO FOLLOW Performed at [facility], [place] [address] [place], [place] [address]    Report Status PENDING  Incomplete  Blood Culture (routine x 2)     Status: Abnormal (Preliminary result)   Collection Time: 11/27/18 11:32 PM  Result Value Ref Range Status   Specimen Description BLOOD LEFT WRIST  Final   Special Requests   Final    BOTTLES DRAWN AEROBIC AND ANAEROBIC Blood Culture results may not be optimal due to an inadequate volume of blood received in culture bottles   Culture  Setup Time   Final    GRAM NEGATIVE RODS IN BOTH AEROBIC AND ANAEROBIC BOTTLES CRITICAL RESULT CALLED TO, READ BACK BY AND VERIFIED WITH: PHARMD M [place] [id number] AT 1403 BY CM    Culture (A)  Final    ESCHERICHIA COLI SUSCEPTIBILITIES TO FOLLOW Performed at [facility], [place] [address] [place], [place] [address]    Report Status PENDING  Incomplete  Blood Culture ID Panel (Reflexed)     Status: Abnormal   Collection Time: 11/27/18 11:32 PM  Result Value Ref Range  Status   Enterococcus species NOT DETECTED NOT DETECTED Final   Listeria monocytogenes NOT DETECTED NOT DETECTED Final   Staphylococcus species NOT DETECTED NOT DETECTED Final   Staphylococcus aureus (BCID) NOT DETECTED NOT DETECTED Final   Streptococcus species NOT DETECTED NOT DETECTED Final   Streptococcus agalactiae NOT DETECTED NOT DETECTED Final   Streptococcus pneumoniae NOT DETECTED NOT DETECTED Final   Streptococcus pyogenes NOT DETECTED NOT DETECTED Final   Acinetobacter baumannii NOT DETECTED NOT DETECTED Final   Enterobacteriaceae species DETECTED (A) NOT DETECTED Final    Comment: Enterobacteriaceae represent a large family of gram-negative bacteria, not a single organism. CRITICAL RESULT CALLED TO, READ BACK BY AND VERIFIED WITH: PHARMD M [place] [id number] AT 1402 BY CM    Enterobacter cloacae complex NOT DETECTED NOT DETECTED Final   Escherichia coli DETECTED (A) NOT DETECTED Final    Comment: CRITICAL RESULT CALLED TO, READ BACK BY AND VERIFIED WITH: PHARMD M [place] [id number] AT 5883 BY CM    Klebsiella oxytoca NOT DETECTED NOT DETECTED Final   Klebsiella pneumoniae NOT DETECTED NOT DETECTED Final   Proteus species NOT DETECTED NOT DETECTED Final   Serratia marcescens NOT DETECTED NOT DETECTED Final   Carbapenem resistance NOT DETECTED NOT DETECTED Final   Haemophilus influenzae NOT DETECTED NOT DETECTED Final   Neisseria meningitidis NOT DETECTED NOT DETECTED Final   Pseudomonas aeruginosa NOT DETECTED NOT DETECTED Final   Candida albicans NOT DETECTED NOT DETECTED Final   Candida glabrata NOT DETECTED NOT DETECTED Final   Candida krusei NOT DETECTED NOT DETECTED Final   Candida parapsilosis NOT DETECTED NOT DETECTED Final   Candida tropicalis NOT DETECTED NOT DETECTED Final    Comment: Performed at [facility], [place] [address] [place],  [address]  MRSA PCR Screening     Status: None   Collection Time: 11/28/18  7:58 AM  Result Value Ref Range Status    MRSA by PCR NEGATIVE NEGATIVE Final    Comment:        The GeneXpert MRSA Assay (FDA approved for NASAL specimens  only), is one component of a comprehensive MRSA colonization surveillance program. It is not intended to diagnose MRSA infection nor to guide or monitor treatment for MRSA infections. Performed at [facility], [place] [address] [place], [place] [address]          Radiology Studies: [facility] 1 View  Result Date: 11/27/2018 CLINICAL DATA:  The patient feels cold.  Vomiting.  Fever. EXAM: PORTABLE CHEST 1 VIEW COMPARISON:  October 01, 2018 FINDINGS: The heart size and mediastinal contours are within normal limits. Both lungs are clear. The visualized skeletal structures are unremarkable. IMPRESSION: No active disease. Electronically Signed   By: Dorise Bullion III M.D   On: 11/27/2018 23:25      Scheduled Meds: . aspirin EC  81 mg Oral Daily  . atorvastatin  80 mg Oral Daily  . digoxin  0.125 mg Oral Daily  . ezetimibe  10 mg Oral Daily  . insulin aspart  0-5 Units Subcutaneous QHS  . insulin aspart  0-9 Units Subcutaneous TID WC  . isosorbide mononitrate  60 mg Oral Daily  . losartan  50 mg Oral Daily  . metoprolol tartrate  12.5 mg Oral BID  . multivitamin  1 tablet Oral Daily  . multivitamin with minerals  1 tablet Oral Daily  . omega-3 acid ethyl esters  1 capsule Oral BID  . pantoprazole  40 mg Oral Daily  . potassium chloride  20 mEq Oral BID  . warfarin  2.5 mg Oral ONCE-1800  . Warfarin - Pharmacist Dosing Inpatient   Does not apply q1800   Continuous Infusions: . cefTRIAXone (ROCEPHIN)  IV       LOS: 1 day      Debbe Odea, MD [facility] Pager: [email or web address] Password [email or web address] 11/29/2018, 12:00 PM

## 2018-11-29 NOTE — Progress Notes (Signed)
ANTICOAGULATION CONSULT NOTE - Initial Consult  Pharmacy Consult for coumadin Indication: atrial fibrillation  Allergies  Allergen Reactions  . Darvon [Propoxyphene] Nausea And Vomiting  . Lisinopril Cough  . Septra [Sulfamethoxazole-Trimethoprim] Other (See Comments)    Increased INR  . Warfarin And Related Other (See Comments)    ONLY TOLERATES BRAND  . Penicillins Rash    Patient Measurements: Height: 5' (152.4 cm) Weight: 136 lb 3.2 oz (61.8 kg)(scale c) IBW/kg (Calculated) : 45.5   Vital Signs: Temp: 98 F (36.7 C) (03/09 0000) Temp Source: Oral (03/09 0451) BP: 160/74 (03/09 0923) Pulse Rate: 97 (03/09 0923)  Labs: Recent Labs    11/27/18 2255 11/28/18 0335 11/28/18 1250 11/29/18 0528  HGB 11.6* 10.3*  --  11.1*  HCT 35.6* 32.1*  --  35.8*  PLT 171 154  --  168  LABPROT 25.0*  --   --  27.9*  INR 2.3*  --   --  2.7*  CREATININE 1.62* 1.51* 1.35* 1.41*    Estimated Creatinine Clearance: 24.8 mL/min (A) (by C-G formula based on SCr of 1.41 mg/dL (H)).   Medical History: Past Medical History:  Diagnosis Date  . Anemia   . Anginal pain ([place])   . Arteriovenous malformation of gastrointestinal tract   . Arthritis    "fingers mostly" (04/12/2015)  . Carotid artery disease ([place])    Carotid US 3/17:  28-31% RICA; 51-76% LICA; Elevated bilateral subclavian artery velocities >>f/u 1 year. // Carotid US 4/18: R 40-59; L 1-39 >> FU 1 year // Carotid US 10/2018: R 40-59; L 1-39, L subclavian stenosis   . CHF (congestive heart failure) ([place])   . Chronic kidney disease (CKD), stage III (moderate) (HCC)   . Chronic lower GI bleeding    "today; last time was ~ 8 yr ago; used to have them often before that too" (02/11/2013)  . Coronary artery disease   . DJD (degenerative joint disease)   . Dysrhythmia   . Heart murmur   . History of blood transfusion    "a few times over the years; usually related to my Coumadin" (04/12/2015  . History of gout   . Hyperlipemia   .  Hypertension   . Macula lutea degeneration   . Old MI (myocardial infarction)    "a coulple /dr in 02/2008; I never even knew I'd had them" (04/12/2015)  . Type II diabetes mellitus (HCC)     Medications:  See medication history  Assessment: 85 yo F admitted for nausea, vomiting, and generalized weakness to continue coumadin for afib.. INR 2.7.  Hg 11.1, PTLC 168. Last coumadin dose taken on 3/8.   PTA coumadin regimen: 2.5mg  on Sun/Wed, all other days 5mg .  Goal of Therapy:  INR 2-2.5 per MD Monitor platelets by anticoagulation protocol: Yes   Plan:  Coumadin 2.5 mg x1 due to elevated INR Daily PT/INR, s/s bleeding  Lavena Bullion, PharmD Candidate 11/29/2018    10:11 AM Please check AMION for all [place] numbers

## 2018-11-29 NOTE — Evaluation (Signed)
Physical Therapy Evaluation Patient Details Name: [name] MRN: [id number] DOB: 1933-09-06 Today's Date: 11/29/2018   History of Present Illness  Pt is an 85 y.o. female with medical history significant of hypertension, hyperlipidemia, diabetes mellitus, GERD, gout, CKD stage III, anemia, GI bleeding, CHF, CKD 3, CAD, stent placement, CABG, aortic valve replacement with a prosthetic valve, and atrial fibrillation on Coumadin. She presented with nausea and vomiting and was found to have a UTI.    Clinical Impression  Pt admitted with above diagnosis. Pt currently with functional limitations due to the deficits listed below (see PT Problem List). PTA pt lived at home with her husband. She was independent and active. On eval, she demonstrated independence with bed mobility and transfers. Supervision provided for ambulation 200 feet without AD. Pt on 2 L O2 in room prior to mobilization with SpO2 95%. Pt ambulated on RA with SpO2 94%. 2 L O2 replaced upon return to room due to mild increase in WOB and 1/4 DOE. She has 2 steps without rails to enter her house. Pt will benefit from skilled PT to increase their independence and safety with mobility to allow discharge to the venue listed below.  PT to follow acutely. No follow up services indicated.     Follow Up Recommendations No PT follow up    Equipment Recommendations  None recommended by PT    Recommendations for Other Services       Precautions / Restrictions Precautions Precautions: Other (comment) Precaution Comments: check O2 sats Restrictions Weight Bearing Restrictions: No      Mobility  Bed Mobility Overal bed mobility: Independent                Transfers Overall transfer level: Modified independent                  Ambulation/Gait Ambulation/Gait assistance: Supervision Gait Distance (Feet): 200 Feet Assistive device: None Gait Pattern/deviations: WFL(Within Functional Limits) Gait velocity:  WFL Gait velocity interpretation: >2.62 ft/sec, indicative of community ambulatory General Gait Details: Pt ambulated on RA with SpO2 94%. 1/4 DOE noted with mildly increased WOB.  Stairs            Wheelchair Mobility    Modified Rankin (Stroke Patients Only)       Balance Overall balance assessment: Mild deficits observed, not formally tested                                           Pertinent Vitals/Pain Pain Assessment: No/denies pain    Home Living Family/patient expects to be discharged to:: Private residence Living Arrangements: Spouse/significant other Available Help at Discharge: Family;Available 24 hours/day Type of Home: House Home Access: Stairs to enter Entrance Stairs-Rails: None Entrance Stairs-Number of Steps: 2 Home Layout: One level Home Equipment: Walker - 2 wheels      Prior Function Level of Independence: Independent         Comments[facility]     Hand Dominance   Dominant Hand: Right    Extremity/Trunk Assessment   Upper Extremity Assessment Upper Extremity Assessment: Overall WFL for tasks assessed    Lower Extremity Assessment Lower Extremity Assessment: Overall WFL for tasks assessed    Cervical / Trunk Assessment Cervical / Trunk Assessment: Normal  Communication   Communication: No difficulties  Cognition Arousal/Alertness: Awake/alert Behavior During Therapy: WFL for  tasks assessed/performed Overall Cognitive Status: Within Functional Limits for tasks assessed                                        General Comments      Exercises     Assessment/Plan    PT Assessment Patient needs continued PT services  PT Problem List Decreased mobility;Cardiopulmonary status limiting activity;Decreased activity tolerance       PT Treatment Interventions Functional mobility training;Balance training;Patient/family education;Gait training;Therapeutic  activities;Stair training;Therapeutic exercise    PT Goals (Current goals can be found in the Care Plan section)  Acute Rehab PT Goals Patient Stated Goal: home PT Goal Formulation: With patient Time For Goal Achievement: 12/06/18 Potential to Achieve Goals: Good    Frequency Min 3X/week   Barriers to discharge        Co-evaluation               AM-PAC PT "6 Clicks" Mobility  Outcome Measure Help needed turning from your back to your side while in a flat bed without using bedrails?: None Help needed moving from lying on your back to sitting on the side of a flat bed without using bedrails?: None Help needed moving to and from a bed to a chair (including a wheelchair)?: None Help needed standing up from a chair using your arms (e.g., wheelchair or bedside chair)?: None Help needed to walk in hospital room?: A Little Help needed climbing 3-5 steps with a railing? : A Little 6 Click Score: 22    End of Session Equipment Utilized During Treatment: Gait belt Activity Tolerance: Patient tolerated treatment well Patient left: in chair;with call bell/phone within reach Nurse Communication: Mobility status PT Visit Diagnosis: Difficulty in walking, not elsewhere classified (R26.2)    Time: 3435-6861 PT Time Calculation (min) (ACUTE ONLY): 12 min   Charges:   PT Evaluation $PT Eval Low Complexity: 1 Low          Lorrin Goodell, PT  Office # [phone] Pager [phone]   Lorriane Shire 11/29/2018, 12:38 PM

## 2018-11-30 ENCOUNTER — Inpatient Hospital Stay (HOSPITAL_COMMUNITY): Payer: Medicare Other

## 2018-11-30 ENCOUNTER — Other Ambulatory Visit (HOSPITAL_COMMUNITY): Payer: Medicare Other

## 2018-11-30 DIAGNOSIS — I361 Nonrheumatic tricuspid (valve) insufficiency: Secondary | ICD-10-CM

## 2018-11-30 DIAGNOSIS — I340 Nonrheumatic mitral (valve) insufficiency: Secondary | ICD-10-CM

## 2018-11-30 LAB — CULTURE, BLOOD (ROUTINE X 2)

## 2018-11-30 LAB — PROTIME-INR
INR: 2.4 — ABNORMAL HIGH (ref 0.8–1.2)
Prothrombin Time: 25.7 seconds — ABNORMAL HIGH (ref 11.4–15.2)

## 2018-11-30 LAB — URINE CULTURE: Culture: NO GROWTH

## 2018-11-30 LAB — GLUCOSE, CAPILLARY
Glucose-Capillary: 126 mg/dL — ABNORMAL HIGH (ref 70–99)
Glucose-Capillary: 148 mg/dL — ABNORMAL HIGH (ref 70–99)
Glucose-Capillary: 149 mg/dL — ABNORMAL HIGH (ref 70–99)
Glucose-Capillary: 191 mg/dL — ABNORMAL HIGH (ref 70–99)

## 2018-11-30 LAB — BASIC METABOLIC PANEL
Anion gap: 9 (ref 5–15)
BUN: 26 mg/dL — ABNORMAL HIGH (ref 8–23)
CO2: 22 mmol/L (ref 22–32)
Calcium: 8.7 mg/dL — ABNORMAL LOW (ref 8.9–10.3)
Chloride: 105 mmol/L (ref 98–111)
Creatinine, Ser: 1.38 mg/dL — ABNORMAL HIGH (ref 0.44–1.00)
GFR calc Af Amer: 41 mL/min — ABNORMAL LOW (ref >60)
GFR calc non Af Amer: 35 mL/min — ABNORMAL LOW (ref >60)
GLUCOSE: 144 mg/dL — AB (ref 70–99)
POTASSIUM: 4.5 mmol/L (ref 3.5–5.1)
Sodium: 136 mmol/L (ref 135–145)

## 2018-11-30 LAB — CBC
HCT: 30.7 % — ABNORMAL LOW (ref 36.0–46.0)
HEMOGLOBIN: 9.7 g/dL — AB (ref 12.0–15.0)
MCH: 28.1 pg (ref 26.0–34.0)
MCHC: 31.6 g/dL (ref 30.0–36.0)
MCV: 89 fL (ref 80.0–100.0)
Platelets: 160 10*3/uL (ref 150–400)
RBC: 3.45 MIL/uL — ABNORMAL LOW (ref 3.87–5.11)
RDW: 15.2 % (ref 11.5–15.5)
WBC: 15.8 10*3/uL — ABNORMAL HIGH (ref 4.0–10.5)
nRBC: 0 % (ref 0.0–0.2)

## 2018-11-30 LAB — ECHOCARDIOGRAM COMPLETE
Height: 60 in
Weight: 2160 oz

## 2018-11-30 MED ORDER — CEFAZOLIN SODIUM-DEXTROSE 2-4 GM/100ML-% IV SOLN
2.0000 g | Freq: Two times a day (BID) | INTRAVENOUS | Status: DC
  Administered 2018-11-30 – 2018-12-02 (×4): 2 g via INTRAVENOUS
  Filled 2018-11-30 (×4): qty 100

## 2018-11-30 MED ORDER — CEPHALEXIN 250 MG PO CAPS: 500.0000 mg | ORAL_CAPSULE | Freq: Two times a day (BID) | ORAL | Status: DC

## 2018-11-30 MED ORDER — FUROSEMIDE 40 MG PO TABS
40.0000 mg | ORAL_TABLET | Freq: Every day | ORAL | Status: DC
  Administered 2018-11-30 – 2018-12-02 (×3): 40 mg via ORAL
  Filled 2018-11-30 (×3): qty 1

## 2018-11-30 MED ORDER — FOLIC ACID 1 MG PO TABS
0.5000 mg | ORAL_TABLET | Freq: Every evening | ORAL | Status: DC
  Administered 2018-11-30 – 2018-12-01 (×2): 0.5 mg via ORAL
  Filled 2018-11-30 (×3): qty 1

## 2018-11-30 MED ORDER — SODIUM CHLORIDE 0.9 % IV SOLN
2.0000 g | INTRAVENOUS | Status: DC
  Filled 2018-11-30: qty 20

## 2018-11-30 MED ORDER — WARFARIN SODIUM 5 MG PO TABS
5.0000 mg | ORAL_TABLET | Freq: Once | ORAL | Status: AC
  Administered 2018-11-30: 5 mg via ORAL
  Filled 2018-11-30: qty 1

## 2018-11-30 NOTE — Consult Note (Signed)
   [facility] CM Inpatient Consult   11/30/2018  [name] Sep 21, 1933 [id number]  Patient screened for high risk score for unplanned readmissions [25%] and hospitalizations to check if potential [place] Management services are needed. Patient was hospitalized for Sepsis - E coli bacteremia. Chart review and narrative from MD notes are as follows: is a 85 y.o.femalewith medical history significant ofhypertension, hyperlipidemia, diabetes mellitus, GERD, gout, CKD stage III, anemia, GI bleeding, CHF, CKD 3, CAD, stent placement, CABG, aortic valve replacement with a prosthetic valve, atrial fibrillation on Coumadin. She presents with nausea and vomiting (short lived) and is found to have a UTI. She had a similar presentation on 10/01/18.  Met with the patient at the bedside.  Patient denies any issues with transportation, medications, or community resource needs.  Patient endorses her Primary Care Provider is Dr[facility]elle [facility]. Transportation to provider her family. Patient did accept a brochure and 24 hour nurse advise line and encouraged to call for needs.  Ffor questions contact:   Natividad Brood, RN BSN [facility] Liaison  [phone] business mobile phone Toll free office [phone]

## 2018-11-30 NOTE — Progress Notes (Signed)
    [facility] has been requested to perform a transesophageal echocardiogram on 12/01/2018 for Bacteremia.  After careful review of history and examination, the risks and benefits of transesophageal echocardiogram have been explained including risks of esophageal damage, perforation (1:10,000 risk), bleeding, pharyngeal hematoma as well as other potential complications associated with conscious sedation including aspiration, arrhythmia, respiratory failure and death. Alternatives to treatment were discussed, questions were answered. Patient is willing to proceed.   Reino Bellis, NP-C 11/30/2018 3:37 PM

## 2018-11-30 NOTE — Progress Notes (Signed)
Discussed therapy with Dr. Wynelle Cleveland for E. Coli bacteremia. Will optimize from ceftriaxone to cefazolin at this time.  Thank you for involving pharmacy in this patient's care.  Janae Bridgeman, PharmD PGY1 Pharmacy Resident Phone: [phone] 11/30/2018 2:42 PM

## 2018-11-30 NOTE — Progress Notes (Signed)
  Echocardiogram 2D Echocardiogram has been performed.  [name] 11/30/2018, 5:44 PM

## 2018-11-30 NOTE — Consult Note (Signed)
ANTICOAGULATION CONSULT NOTE - Follow Up Consult  Pharmacy Consult for warfarin dosing Indication: atrial fibrillation  Allergies  Allergen Reactions  . Darvon [Propoxyphene] Nausea And Vomiting  . Lisinopril Cough  . Septra [Sulfamethoxazole-Trimethoprim] Other (See Comments)    Increased INR  . Warfarin And Related Other (See Comments)    ONLY TOLERATES BRAND  . Penicillins Rash    Patient Measurements: Height: 5' (152.4 cm) Weight: 135 lb (61.2 kg) IBW/kg (Calculated) : 45.5  Vital Signs: Temp: 97.6 F (36.4 C) (03/10 0815) Temp Source: Oral (03/10 0815) BP: 132/72 (03/10 0815) Pulse Rate: 97 (03/10 0815)  Labs: Recent Labs    11/27/18 2255 11/28/18 0335 11/28/18 1250 11/29/18 0528 11/30/18 0447  HGB 11.6* 10.3*  --  11.1* 9.7*  HCT 35.6* 32.1*  --  35.8* 30.7*  PLT 171 154  --  168 160  LABPROT 25.0*  --   --  27.9* 25.7*  INR 2.3*  --   --  2.7* 2.4*  CREATININE 1.62* 1.51* 1.35* 1.41* 1.38*    Estimated Creatinine Clearance: 25.3 mL/min (A) (by C-G formula based on SCr of 1.38 mg/dL (H)).  Assessment: 85 yo F admitted for sepsis secondary to a UTI to continue warfarin for Afib. INR 2.4. Hgb 9.7, Plt 160.  PTA warfarin regimen: 2.5mg  on Sun/Wed, all other days 5mg .  Goal of Therapy:  INR 2-2.5 per MD Monitor platelets by anticoagulation protocol: Yes   Plan:  Warfarin 5 mg x1 due to goal INR Daily PT/INR, s/s bleeding  Thanks, Lavena Bullion, PharmD Candidate 11/30/2018,9:59 AM

## 2018-11-30 NOTE — Plan of Care (Signed)
  Problem: Education: Goal: Knowledge of General Education information will improve Description Including pain rating scale, medication(s)/side effects and non-pharmacologic comfort measures Outcome: Progressing   Problem: Health Behavior/Discharge Planning: Goal: Ability to manage health-related needs will improve Outcome: Progressing   

## 2018-11-30 NOTE — Progress Notes (Signed)
PROGRESS NOTE    [name]   XTG:[id number]  DOB: 03/16/1933  DOA: 11/27/2018 PCP: Gaynelle Arabian, MD   Brief Narrative:  [name]  is a 85 y.o.femalewith medical history significant ofhypertension, hyperlipidemia, diabetes mellitus, GERD, gout, CKD stage III, anemia, GI bleeding, CHF, CKD 3, CAD, stent placement, CABG, aortic valve replacement with a prosthetic valve, atrial fibrillation on Coumadin. She presents with nausea and vomiting (short lived) and is found to have a UTI. She had a similar presentation on 10/01/18.    Subjective: Patient wants to go home today. I have convinced her to stay another day.     Assessment & Plan:  Principal Problem:   Sepsis - E coli bacteremia - fever, leukocytosis -  last grew out pan sensitive e coli from urine culture in January - urine culture sent after antibiotics given and thus negative this time - repeat blood cultures and obtain TTE and TEE  - d/c'd Invokana as it predisposes to recurrent UTIs- d/w patient   Active Problems: Nausea and vomiting- due to above - improved- tolerating a solid diet  Acute d CHF - as a result of IVF- improved after a dose of IV Lasix 40 mg - resumed home oral Lasix  Elevated T bili - ? Was elevated in Jan as well but AST, ALT, Alk phos normal - has improved and was likely secondary to sepsis    S/P AVR (aortic valve replacement) - mechanical valve- INR within range    Coronary Artery Disease -cont statin, Aspirin and Coumadin    Type II diabetes mellitus with renal manifestations  - d/c Invokana- start Metformin when discharged-  d/w patient - cont SSI    CKD (chronic kidney disease), stage III  - stable  HTN - resumed Imdur, Metoprolol, Losartan    PAF (paroxysmal atrial fibrillation)  - Coumadin, Metoprolol and Digoxin    Time spent in minutes: 35 DVT prophylaxis: Coumadin Code Status: Full code Family Communication:  Husband at bedside Disposition Plan: f/u  on ECHO and blood cultures Consultants:   none Procedures:   none Antimicrobials:  Anti-infectives (From admission, onward)   Start     Dose/Rate Route Frequency Ordered Stop   11/30/18 2200  cefTRIAXone (ROCEPHIN) 2 g in sodium chloride 0.9 % 100 mL IVPB     2 g 200 mL/hr over 30 Minutes Intravenous Every 24 hours 11/30/18 1005     11/30/18 1000  cephALEXin (KEFLEX) capsule 500 mg  Status:  Discontinued     500 mg Oral Every 12 hours 11/30/18 0811 11/30/18 1004   11/29/18 2300  cefTRIAXone (ROCEPHIN) 2 g in sodium chloride 0.9 % 100 mL IVPB  Status:  Discontinued     2 g 200 mL/hr over 30 Minutes Intravenous Every 24 hours 11/29/18 1017 11/30/18 0811   11/28/18 0315  cefTRIAXone (ROCEPHIN) 1 g in sodium chloride 0.9 % 100 mL IVPB  Status:  Discontinued     1 g 200 mL/hr over 30 Minutes Intravenous Every 24 hours 11/28/18 0309 11/28/18 0507   11/27/18 2300  cefTRIAXone (ROCEPHIN) 1 g in sodium chloride 0.9 % 100 mL IVPB  Status:  Discontinued     1 g 200 mL/hr over 30 Minutes Intravenous Every 24 hours 11/27/18 2251 11/29/18 1017       Objective: Vitals:   11/29/18 1959 11/30/18 0632 11/30/18 0637 11/30/18 0815  BP: (!) 128/54  (!) 141/62 132/72  Pulse: 82  78 97  Resp: 20  20 19  Temp: 98.6 F (37 C)  98.2 F (36.8 C) 97.6 F (36.4 C)  TempSrc: Oral  Oral Oral  SpO2: 98%  96% 97%  Weight:  61.2 kg    Height:        Intake/Output Summary (Last 24 hours) at 11/30/2018 1017 Last data filed at 11/30/2018 0800 Gross per 24 hour  Intake 761.29 ml  Output 950 ml  Net -188.71 ml   Filed Weights   11/28/18 0509 11/29/18 0451 11/30/18 7017  Weight: 62.2 kg 61.8 kg 61.2 kg    Examination: General exam: Appears comfortable  HEENT: PERRLA, oral mucosa moist, no sclera icterus or thrush Respiratory system: Clear to auscultation. Respiratory effort normal. Cardiovascular system: S1 & S2 heard,  No murmurs  Gastrointestinal system: Abdomen soft, non-tender, nondistended.  Normal bowel sounds   Central nervous system: Alert and oriented. No focal neurological deficits. Extremities: No cyanosis, clubbing or edema Skin: No rashes or ulcers Psychiatry:  Mood & affect appropriate.   Data Reviewed: I have personally reviewed following labs and imaging studies  CBC: Recent Labs  Lab 11/27/18 2255 11/28/18 0335 11/29/18 0528 11/30/18 0447  WBC 10.5 16.1* 23.2* 15.8*  NEUTROABS 9.9*  --   --   --   HGB 11.6* 10.3* 11.1* 9.7*  HCT 35.6* 32.1* 35.8* 30.7*  MCV 89.0 89.7 91.3 89.0  PLT 171 154 168 793   Basic Metabolic Panel: Recent Labs  Lab 11/27/18 2255 11/28/18 0335 11/28/18 1250 11/29/18 0528 11/30/18 0447  NA 130* 136 134* 137 136  K 3.7 4.1 4.6 4.8 4.5  CL 100 107 107 108 105  CO2 21* 20* 19* 22 22  GLUCOSE 183* 208* 226* 166* 144*  BUN 29* 28* 26* 26* 26*  CREATININE 1.62* 1.51* 1.35* 1.41* 1.38*  CALCIUM 8.9 8.0* 8.6* 8.8* 8.7*   GFR: Estimated Creatinine Clearance: 25.3 mL/min (A) (by C-G formula based on SCr of 1.38 mg/dL (H)). Liver Function Tests: Recent Labs  Lab 11/27/18 2255 11/28/18 0335 11/28/18 1250 11/29/18 0528  AST 37 30 32 34  ALT _0 ALKPHOS 78 63 71 83  BILITOT 1.3* 1.4* 1.3* 0.9  PROT 6.8 5.9* 6.0* 6.2*  ALBUMIN 3.8 3.2* 3.1* 3.0*   No results for input(s): LIPASE, AMYLASE in the last 168 hours. No results for input(s): AMMONIA in the last 168 hours. Coagulation Profile: Recent Labs  Lab 11/27/18 2255 11/29/18 0528 11/30/18 0447  INR 2.3* 2.7* 2.4*   Cardiac Enzymes: No results for input(s): CKTOTAL, CKMB, CKMBINDEX, TROPONINI in the last 168 hours. BNP (last 3 results) No results for input(s): PROBNP in the last 8760 hours. HbA1C: No results for input(s): HGBA1C in the last 72 hours. CBG: Recent Labs  Lab 11/29/18 0623 11/29/18 1111 11/29/18 1639 11/29/18 2142 11/30/18 0626  GLUCAP 158* 203* 141* 151* 126*   Lipid Profile: No results for input(s): CHOL, HDL, LDLCALC, TRIG,  CHOLHDL, LDLDIRECT in the last 72 hours. Thyroid Function Tests: No results for input(s): TSH, T4TOTAL, FREET4, T3FREE, THYROIDAB in the last 72 hours. Anemia Panel: No results for input(s): VITAMINB12, FOLATE, FERRITIN, TIBC, IRON, RETICCTPCT in the last 72 hours. Urine analysis:    Component Value Date/Time   COLORURINE YELLOW 11/28/2018 0058   APPEARANCEUR HAZY (A) 11/28/2018 0058   LABSPEC 1.014 11/28/2018 0058   PHURINE 6.0 11/28/2018 0058   GLUCOSEU >=500 (A) 11/28/2018 0058   HGBUR MODERATE (A) 11/28/2018 0058   BILIRUBINUR NEGATIVE 11/28/2018 [place] 11/28/2018 0058  PROTEINUR 30 (A) 11/28/2018 0058   UROBILINOGEN 0.2 04/20/2008 2135   NITRITE POSITIVE (A) 11/28/2018 0058   LEUKOCYTESUR MODERATE (A) 11/28/2018 0058   Sepsis Labs: _0 (procalcitonin:4,lacticidven:4) ) Recent Results (from the past 240 hour(s))  Blood Culture (routine x 2)     Status: Abnormal   Collection Time: 11/27/18 10:51 PM  Result Value Ref Range Status   Specimen Description BLOOD RIGHT FOREARM  Final   Special Requests   Final    BOTTLES DRAWN AEROBIC AND ANAEROBIC Blood Culture results may not be optimal due to an inadequate volume of blood received in culture bottles   Culture  Setup Time   Final    GRAM NEGATIVE RODS AEROBIC BOTTLE ONLY CRITICAL VALUE NOTED.  VALUE IS CONSISTENT WITH PREVIOUSLY REPORTED AND CALLED VALUE.    Culture (A)  Final    ESCHERICHIA COLI SUSCEPTIBILITIES PERFORMED ON PREVIOUS CULTURE WITHIN THE LAST 5 DAYS. Performed at [facility], [place] [address], [place], [place] [address]    Report Status 11/30/2018 FINAL  Final  Blood Culture (routine x 2)     Status: Abnormal   Collection Time: 11/27/18 11:32 PM  Result Value Ref Range Status   Specimen Description BLOOD LEFT WRIST  Final   Special Requests   Final    BOTTLES DRAWN AEROBIC AND ANAEROBIC Blood Culture results may not be optimal due to an inadequate volume of blood received in  culture bottles   Culture  Setup Time   Final    GRAM NEGATIVE RODS IN BOTH AEROBIC AND ANAEROBIC BOTTLES CRITICAL RESULT CALLED TO, READ BACK BY AND VERIFIED WITH: Ailene Rud [id number] AT 1403 BY CM Performed at [facility], [place] [address] [place], [place] [address]    Culture ESCHERICHIA COLI (A)  Final   Report Status 11/30/2018 FINAL  Final   Organism ID, Bacteria ESCHERICHIA COLI  Final      Susceptibility   Escherichia coli - MIC*    AMPICILLIN <=2 SENSITIVE Sensitive     CEFAZOLIN <=4 SENSITIVE Sensitive     CEFEPIME <=1 SENSITIVE Sensitive     CEFTAZIDIME <=1 SENSITIVE Sensitive     CEFTRIAXONE <=1 SENSITIVE Sensitive     CIPROFLOXACIN <=0.25 SENSITIVE Sensitive     GENTAMICIN <=1 SENSITIVE Sensitive     IMIPENEM <=0.25 SENSITIVE Sensitive     TRIMETH/SULFA <=20 SENSITIVE Sensitive     AMPICILLIN/SULBACTAM <=2 SENSITIVE Sensitive     PIP/TAZO <=4 SENSITIVE Sensitive     Extended ESBL NEGATIVE Sensitive     * ESCHERICHIA COLI  Blood Culture ID Panel (Reflexed)     Status: Abnormal   Collection Time: 11/27/18 11:32 PM  Result Value Ref Range Status   Enterococcus species NOT DETECTED NOT DETECTED Final   Listeria monocytogenes NOT DETECTED NOT DETECTED Final   Staphylococcus species NOT DETECTED NOT DETECTED Final   Staphylococcus aureus (BCID) NOT DETECTED NOT DETECTED Final   Streptococcus species NOT DETECTED NOT DETECTED Final   Streptococcus agalactiae NOT DETECTED NOT DETECTED Final   Streptococcus pneumoniae NOT DETECTED NOT DETECTED Final   Streptococcus pyogenes NOT DETECTED NOT DETECTED Final   Acinetobacter baumannii NOT DETECTED NOT DETECTED Final   Enterobacteriaceae species DETECTED (A) NOT DETECTED Final    Comment: Enterobacteriaceae represent a large family of gram-negative bacteria, not a single organism. CRITICAL RESULT CALLED TO, READ BACK BY AND VERIFIED WITH: PHARMD M [place] [id number] AT 1402 BY CM    Enterobacter cloacae complex NOT  DETECTED NOT  DETECTED Final   Escherichia coli DETECTED (A) NOT DETECTED Final    Comment: CRITICAL RESULT CALLED TO, READ BACK BY AND VERIFIED WITH: PHARMD M Protection [id number] AT 8413 BY CM    Klebsiella oxytoca NOT DETECTED NOT DETECTED Final   Klebsiella pneumoniae NOT DETECTED NOT DETECTED Final   Proteus species NOT DETECTED NOT DETECTED Final   Serratia marcescens NOT DETECTED NOT DETECTED Final   Carbapenem resistance NOT DETECTED NOT DETECTED Final   Haemophilus influenzae NOT DETECTED NOT DETECTED Final   Neisseria meningitidis NOT DETECTED NOT DETECTED Final   Pseudomonas aeruginosa NOT DETECTED NOT DETECTED Final   Candida albicans NOT DETECTED NOT DETECTED Final   Candida glabrata NOT DETECTED NOT DETECTED Final   Candida krusei NOT DETECTED NOT DETECTED Final   Candida parapsilosis NOT DETECTED NOT DETECTED Final   Candida tropicalis NOT DETECTED NOT DETECTED Final    Comment: Performed at [facility], [place]. [address] [place], [place] [address]  MRSA PCR Screening     Status: None   Collection Time: 11/28/18  7:58 AM  Result Value Ref Range Status   MRSA by PCR NEGATIVE NEGATIVE Final    Comment:        The GeneXpert MRSA Assay (FDA approved for NASAL specimens only), is one component of a comprehensive MRSA colonization surveillance program. It is not intended to diagnose MRSA infection nor to guide or monitor treatment for MRSA infections. Performed at [facility], [place] [address] [place], [place] [address]   Urine culture     Status: None   Collection Time: 11/28/18  8:40 PM  Result Value Ref Range Status   Specimen Description URINE, RANDOM  Final   Special Requests NONE  Final   Culture   Final    NO GROWTH Performed at [facility], [place] [address] [place], [place] [address]    Report Status 11/30/2018 FINAL  Final         Radiology Studies: No results found.    Scheduled Meds: . aspirin EC  81 mg Oral Daily  . atorvastatin   80 mg Oral Daily  . digoxin  0.125 mg Oral Daily  . ezetimibe  10 mg Oral Daily  . folic acid  034 mcg Oral QPM  . furosemide  40 mg Oral Daily  . insulin aspart  0-5 Units Subcutaneous QHS  . insulin aspart  0-9 Units Subcutaneous TID WC  . isosorbide mononitrate  60 mg Oral Daily  . losartan  50 mg Oral Daily  . metoprolol tartrate  12.5 mg Oral BID  . multivitamin  1 tablet Oral Daily  . multivitamin with minerals  1 tablet Oral Daily  . omega-3 acid ethyl esters  1 capsule Oral BID  . pantoprazole  40 mg Oral Daily  . potassium chloride  20 mEq Oral BID  . warfarin  5 mg Oral ONCE-1800  . Warfarin - Pharmacist Dosing Inpatient   Does not apply q1800   Continuous Infusions: . sodium chloride 250 mL (11/29/18 2217)  . cefTRIAXone (ROCEPHIN)  IV       LOS: 2 days      Debbe Odea, MD [facility] Pager: [email or web address] Password [facility] 11/30/2018, 10:17 AM

## 2018-11-30 NOTE — Progress Notes (Signed)
Physical Therapy Treatment Patient Details Name: [name] MRN: [id number] DOB: [birth year] Today's Date: 11/30/2018    History of Present Illness Pt is an 85 y.o. female with medical history significant of hypertension, hyperlipidemia, diabetes mellitus, GERD, gout, CKD stage III, anemia, GI bleeding, CHF, CKD 3, CAD, stent placement, CABG, aortic valve replacement with a prosthetic valve, and atrial fibrillation on Coumadin. She presented with nausea and vomiting and was found to have a UTI.    PT Comments    Pt received in recliner, agreeable to participation in therapy. Pt reports she has been mobilizing independently in room. Supervision provided for hallway ambulation 250 feet without AD. Pt demonstrated steady gait. SpO2 95% on RA with max HR 127. 1/4 DOE noted. Pt is hopeful for discharge home today.    Follow Up Recommendations  No PT follow up     Equipment Recommendations  None recommended by PT    Recommendations for Other Services       Precautions / Restrictions Precautions Precautions: Other (comment) Precaution Comments: watch sats and HR    Mobility  Bed Mobility Overal bed mobility: Independent                Transfers Overall transfer level: Modified independent                  Ambulation/Gait Ambulation/Gait assistance: Supervision Gait Distance (Feet): 250 Feet Assistive device: None Gait Pattern/deviations: WFL(Within Functional Limits) Gait velocity: WFL Gait velocity interpretation: >2.62 ft/sec, indicative of community ambulatory General Gait Details: steady gait. SpO2 95% on RA. Max HR 127. 1/4 DOE noted.   Stairs Stairs: (Pt declined stating no concerns with managing 2 steps into house)           Wheelchair Mobility    Modified Rankin (Stroke Patients Only)       Balance Overall balance assessment: Mild deficits observed, not formally tested                                           Cognition Arousal/Alertness: Awake/alert Behavior During Therapy: WFL for tasks assessed/performed Overall Cognitive Status: Within Functional Limits for tasks assessed                                        Exercises      General Comments        Pertinent Vitals/Pain Pain Assessment: No/denies pain    Home Living                      Prior Function            PT Goals (current goals can now be found in the care plan section) Acute Rehab PT Goals Patient Stated Goal: home PT Goal Formulation: With patient Time For Goal Achievement: 12/06/18 Potential to Achieve Goals: Good Progress towards PT goals: Progressing toward goals    Frequency    Min 3X/week      PT Plan Current plan remains appropriate    Co-evaluation              AM-PAC PT "6 Clicks" Mobility   Outcome Measure  Help needed turning from your back to your side while in a flat bed without using bedrails?: None Help needed moving from lying  on your back to sitting on the side of a flat bed without using bedrails?: None Help needed moving to and from a bed to a chair (including a wheelchair)?: None Help needed standing up from a chair using your arms (e.g., wheelchair or bedside chair)?: None Help needed to walk in hospital room?: None Help needed climbing 3-5 steps with a railing? : A Little 6 Click Score: 23    End of Session Equipment Utilized During Treatment: Gait belt Activity Tolerance: Patient tolerated treatment well Patient left: in chair;with call bell/phone within reach Nurse Communication: Mobility status PT Visit Diagnosis: Difficulty in walking, not elsewhere classified (R26.2)     Time: 4332-9518 PT Time Calculation (min) (ACUTE ONLY): 10 min  Charges:  $Gait Training: 8-22 mins                     [name], PT  Office # [phone] Pager [phone]    Lorriane Shire 11/30/2018, 10:36 AM

## 2018-12-01 ENCOUNTER — Encounter (HOSPITAL_COMMUNITY)
Admission: EM | Disposition: A | Discharge status: Home / Self Care | Payer: Self-pay | Source: Home / Self Care | Attending: Internal Medicine

## 2018-12-01 ENCOUNTER — Encounter (HOSPITAL_COMMUNITY): Payer: Self-pay

## 2018-12-01 ENCOUNTER — Inpatient Hospital Stay (HOSPITAL_COMMUNITY): Payer: Medicare Other

## 2018-12-01 DIAGNOSIS — I351 Nonrheumatic aortic (valve) insufficiency: Secondary | ICD-10-CM

## 2018-12-01 DIAGNOSIS — I361 Nonrheumatic tricuspid (valve) insufficiency: Secondary | ICD-10-CM

## 2018-12-01 DIAGNOSIS — N183 Chronic kidney disease, stage 3 (moderate): Secondary | ICD-10-CM

## 2018-12-01 DIAGNOSIS — I340 Nonrheumatic mitral (valve) insufficiency: Secondary | ICD-10-CM

## 2018-12-01 DIAGNOSIS — K5520 Angiodysplasia of colon without hemorrhage: Secondary | ICD-10-CM

## 2018-12-01 HISTORY — PX: TEE WITHOUT CARDIOVERSION: SHX5443

## 2018-12-01 LAB — GLUCOSE, CAPILLARY
GLUCOSE-CAPILLARY: 153 mg/dL — AB (ref 70–99)
Glucose-Capillary: 130 mg/dL — ABNORMAL HIGH (ref 70–99)
Glucose-Capillary: 173 mg/dL — ABNORMAL HIGH (ref 70–99)
Glucose-Capillary: 139 mg/dL — ABNORMAL HIGH (ref 70–99)
Glucose-Capillary: 152 mg/dL — ABNORMAL HIGH (ref 70–99)

## 2018-12-01 LAB — PROTIME-INR
INR: 2.1 — ABNORMAL HIGH (ref 0.8–1.2)
Prothrombin Time: 23.6 seconds — ABNORMAL HIGH (ref 11.4–15.2)

## 2018-12-01 SURGERY — ECHOCARDIOGRAM, TRANSESOPHAGEAL
Anesthesia: Moderate Sedation

## 2018-12-01 MED ORDER — FENTANYL CITRATE (PF) 100 MCG/2ML IJ SOLN
INTRAMUSCULAR | Status: DC | PRN
  Administered 2018-12-01 (×2): 25 ug via INTRAVENOUS

## 2018-12-01 MED ORDER — WARFARIN SODIUM 5 MG PO TABS
5.0000 mg | ORAL_TABLET | Freq: Once | ORAL | Status: AC
  Administered 2018-12-01: 5 mg via ORAL
  Filled 2018-12-01: qty 1

## 2018-12-01 MED ORDER — FENTANYL CITRATE (PF) 100 MCG/2ML IJ SOLN
INTRAMUSCULAR | Status: AC
  Filled 2018-12-01: qty 2

## 2018-12-01 MED ORDER — MIDAZOLAM HCL (PF) 10 MG/2ML IJ SOLN
INTRAMUSCULAR | Status: DC | PRN
  Administered 2018-12-01 (×2): 1 mg via INTRAVENOUS

## 2018-12-01 MED ORDER — BUTAMBEN-TETRACAINE-BENZOCAINE 2-2-14 % EX AERO
INHALATION_SPRAY | CUTANEOUS | Status: DC | PRN
  Administered 2018-12-01: 2 via TOPICAL

## 2018-12-01 MED ORDER — SODIUM CHLORIDE 0.9 % IV SOLN: INTRAVENOUS | Status: DC

## 2018-12-01 MED ORDER — MIDAZOLAM HCL (PF) 5 MG/ML IJ SOLN
INTRAMUSCULAR | Status: AC
  Filled 2018-12-01: qty 2

## 2018-12-01 NOTE — Progress Notes (Signed)
Patient down for procedure.

## 2018-12-01 NOTE — Plan of Care (Signed)

## 2018-12-01 NOTE — Consult Note (Signed)
ANTICOAGULATION CONSULT NOTE - Follow Up Consult  Pharmacy Consult for warfarin dosing Indication: atrial fibrillation  Allergies  Allergen Reactions  . Darvon [Propoxyphene] Nausea And Vomiting  . Lisinopril Cough  . Septra [Sulfamethoxazole-Trimethoprim] Other (See Comments)    Increased INR  . Warfarin And Related Other (See Comments)    ONLY TOLERATES BRAND  . Penicillins Rash    Patient Measurements: Height: 5' (152.4 cm) Weight: 134 lb 1.6 oz (60.8 kg) IBW/kg (Calculated) : 45.5  Vital Signs: Temp: 98 F (36.7 C) (03/11 0600) Temp Source: Oral (03/11 0600) BP: 160/66 (03/11 0600) Pulse Rate: 79 (03/11 0600)  Labs: Recent Labs    11/28/18 1250 11/29/18 0528 11/30/18 0447 12/01/18 0451  HGB  --  11.1* 9.7*  --   HCT  --  35.8* 30.7*  --   PLT  --  168 160  --   LABPROT  --  27.9* 25.7* 23.6*  INR  --  2.7* 2.4* 2.1*  CREATININE 1.35* 1.41* 1.38*  --     Estimated Creatinine Clearance: 25.2 mL/min (A) (by C-G formula based on SCr of 1.38 mg/dL (H)).  Assessment: 85 yo F admitted for sepsis secondary to a UTI to continue warfarin for Afib. INR 2.1. Hgb 9.7, Plt 160.  PTA warfarin regimen: 2.5mg  on Sun/Wed, all other days 5mg .  Goal of Therapy:  INR 2-2.5 per MD Monitor platelets by anticoagulation protocol: Yes   Plan:  Warfarin 5 mg x1 due to decreasing INR from held doses over past few days. Daily PT/INR, s/s bleeding  Thanks, Lavena Bullion, PharmD Candidate 12/01/2018,9:55 AM

## 2018-12-01 NOTE — Progress Notes (Signed)
  Echocardiogram Echocardiogram Transesophageal has been performed.  [name] 12/01/2018, 3:50 PM

## 2018-12-01 NOTE — CV Procedure (Signed)
   Transesophageal Echocardiogram  Indications: Bacteremia  Time out performed  During this procedure the patient is administered a total of Versed 3 mg and Fentanyl 25 mcg to achieve and maintain moderate conscious sedation.  The patient's heart rate, blood pressure, and oxygen saturation are monitored continuously during the procedure. The period of conscious sedation is 15 minutes, of which I was present face-to-face 100% of this time.  Findings:  Left Ventricle: Normal ejection fraction 65 to 70%  Mitral Valve: Anterior mitral valve leaflet prolapse mild to moderate regurgitation  Aortic Valve: TAVR valve, mild eccentric perivalvular leak  Tricuspid Valve: Moderate tricuspid regurgitation  Left Atrium: Normal, no thrombus  Impression: No evidence of endocarditis, vegetation  Candee Furbish, MD

## 2018-12-01 NOTE — Progress Notes (Signed)
Patients BP has been elevated, MD paged.

## 2018-12-01 NOTE — Progress Notes (Signed)
Patient back from procedure.

## 2018-12-01 NOTE — Care Management Important Message (Signed)
Important Message  Patient Details  Name: PHILA SHOAF MRN: [id number] Date of Birth: [birth year]   Medicare Important Message Given:  Yes    Derriona Branscom P Benisha Hadaway 12/01/2018, 4:53 PM

## 2018-12-01 NOTE — Progress Notes (Signed)
PROGRESS NOTE    [name]  VOZ:[id number] DOB: [birth year] DOA: 11/27/2018 PCP: Gaynelle Arabian, MD    Brief Narrative:  85 year old female who presented with nausea, vomiting and generalized weakness.  She has significant past medical history for atrial fibrillation, type 2 diabetes mellitus, hypertension, mechanical aortic valve replacement, and chronic kidney disease stage III.  She reported 2 days of nausea and vomiting, associated with dysuria and generalized malaise.  Her initial physical examination she was febrile 104.1 F, blood pressure 92/42, pulse rate 92, respiratory 30, oxygen saturation 94% on room air.  Parents, lungs clear to auscultation bilaterally, heart S1-S2 present rhythmic, positive ejection Solik murmur, abdomen was soft, no lower extremity edema.  Her urinalysis Greater than 50 white cells, 30 protein.  Patient was admitted to the hospital with a working diagnosis of sepsis due to urinary tract infection.  Assessment & Plan:   Principal Problem:   Sepsis ([place]) Active Problems:   S/P AVR (aortic valve replacement)   Coronary Artery Disease   Type II diabetes mellitus with renal manifestations (HCC)   CKD (chronic kidney disease), stage III (HCC)   PAF (paroxysmal atrial fibrillation) (HCC)   AVM (arteriovenous malformation) of small bowel, acquired   UTI (urinary tract infection)   Nausea vomiting and diarrhea   Chronic diastolic CHF (congestive heart failure) ([place])   1. Sepsis due to gram negative bacteremia, due to urine infection, present on admission. Patient has remained afebrile, repeat cultures have been no growth, will follow on echocardiographic studies. Continue antibiotic therapy with IV ceftriaxone.   2. Acute on chronic diastolic hear failure. Improved volume status after diuresis, will continue home furosemide dose.    3. T2DM. Patient is tolerating po well,, will continue insulin sliding scale for glucose cover and monitoring.   4. CKD  stage 3. Renal function has remained stable,   5. HTN. Continue blood pressure control with losartan, isosorbide and metoprolol.  6. Paroxysmal atrial fibrillation. Continue rate control with metoprolol, digoxin and anticoagulation with warfarin.    DVT prophylaxis: warfarin   Code Status: full Family Communication: I spoke with patient's family at the bedside and all questions were addressed.  Disposition Plan/ discharge barriers: pending echocardiogram.   Body mass index is 26.19 kg/m. Malnutrition Type:      Malnutrition Characteristics:      Nutrition Interventions:     RN Pressure Injury Documentation:     Consultants:   Cardiology   Procedures:   tee  Antimicrobials:   Ceftriaxone IV     Subjective: Patient is feeling better, no chest pain, no nausea or vomiting. Has bee out of bed to the chair. Positive dyspnea on exertion.   Objective: Vitals:   12/01/18 0556 12/01/18 0600 12/01/18 1029 12/01/18 1156  BP:  (!) 160/66 (!) 183/97 (!) 171/81  Pulse:  79 82 78  Resp:  20  16  Temp:  98 F (36.7 C)  97.7 F (36.5 C)  TempSrc:  Oral  Oral  SpO2:  98%  99%  Weight: 60.8 kg     Height:        Intake/Output Summary (Last 24 hours) at 12/01/2018 1254 Last data filed at 12/01/2018 1225 Gross per 24 hour  Intake 744.91 ml  Output 1650 ml  Net -905.09 ml   Filed Weights   11/29/18 0451 11/30/18 0632 12/01/18 0556  Weight: 61.8 kg 61.2 kg 60.8 kg    Examination:   General: Not in pain or dyspnea, deconditioned  Neurology: Awake  and alert, non focal  E ENT: no pallor, no icterus, oral mucosa moist Cardiovascular: No JVD. Y8-M5 (mechanical click) present, rhythmic, no gallops, rubs, or murmurs. No lower extremity edema. Pulmonary: positive breath sounds bilaterally, adequate air movement, no wheezing, rhonchi or rales. Gastrointestinal. Abdomen with, no organomegaly, non tender, no rebound or guarding Skin. No rashes Musculoskeletal: no joint  deformities     Data Reviewed: I have personally reviewed following labs and imaging studies  CBC: Recent Labs  Lab 11/27/18 2255 11/28/18 0335 11/29/18 0528 11/30/18 0447  WBC 10.5 16.1* 23.2* 15.8*  NEUTROABS 9.9*  --   --   --   HGB 11.6* 10.3* 11.1* 9.7*  HCT 35.6* 32.1* 35.8* 30.7*  MCV 89.0 89.7 91.3 89.0  PLT 171 154 168 784   Basic Metabolic Panel: Recent Labs  Lab 11/27/18 2255 11/28/18 0335 11/28/18 1250 11/29/18 0528 11/30/18 0447  NA 130* 136 134* 137 136  K 3.7 4.1 4.6 4.8 4.5  CL 100 107 107 108 105  CO2 21* 20* 19* 22 22  GLUCOSE 183* 208* 226* 166* 144*  BUN 29* 28* 26* 26* 26*  CREATININE 1.62* 1.51* 1.35* 1.41* 1.38*  CALCIUM 8.9 8.0* 8.6* 8.8* 8.7*   GFR: Estimated Creatinine Clearance: 25.2 mL/min (A) (by C-G formula based on SCr of 1.38 mg/dL (H)). Liver Function Tests: Recent Labs  Lab 11/27/18 2255 11/28/18 0335 11/28/18 1250 11/29/18 0528  AST 37 30 32 34  ALT 31 26 27 25   ALKPHOS 78 63 71 83  BILITOT 1.3* 1.4* 1.3* 0.9  PROT 6.8 5.9* 6.0* 6.2*  ALBUMIN 3.8 3.2* 3.1* 3.0*   No results for input(s): LIPASE, AMYLASE in the last 168 hours. No results for input(s): AMMONIA in the last 168 hours. Coagulation Profile: Recent Labs  Lab 11/27/18 2255 11/29/18 0528 11/30/18 0447 12/01/18 0451  INR 2.3* 2.7* 2.4* 2.1*   Cardiac Enzymes: No results for input(s): CKTOTAL, CKMB, CKMBINDEX, TROPONINI in the last 168 hours. BNP (last 3 results) No results for input(s): PROBNP in the last 8760 hours. HbA1C: No results for input(s): HGBA1C in the last 72 hours. CBG: Recent Labs  Lab 11/30/18 1148 11/30/18 1622 11/30/18 2158 12/01/18 0602 12/01/18 1125  GLUCAP 148* 149* 191* 130* 153*   Lipid Profile: No results for input(s): CHOL, HDL, LDLCALC, TRIG, CHOLHDL, LDLDIRECT in the last 72 hours. Thyroid Function Tests: No results for input(s): TSH, T4TOTAL, FREET4, T3FREE, THYROIDAB in the last 72 hours. Anemia Panel: No results  for input(s): VITAMINB12, FOLATE, FERRITIN, TIBC, IRON, RETICCTPCT in the last 72 hours.    Radiology Studies: I have reviewed all of the imaging during this hospital visit personally     Scheduled Meds: . aspirin EC  81 mg Oral Daily  . atorvastatin  80 mg Oral Daily  . digoxin  0.125 mg Oral Daily  . ezetimibe  10 mg Oral Daily  . folic acid  0.5 mg Oral QPM  . furosemide  40 mg Oral Daily  . insulin aspart  0-5 Units Subcutaneous QHS  . insulin aspart  0-9 Units Subcutaneous TID WC  . isosorbide mononitrate  60 mg Oral Daily  . losartan  50 mg Oral Daily  . metoprolol tartrate  12.5 mg Oral BID  . multivitamin  1 tablet Oral Daily  . multivitamin with minerals  1 tablet Oral Daily  . omega-3 acid ethyl esters  1 capsule Oral BID  . pantoprazole  40 mg Oral Daily  . potassium chloride  20  mEq Oral BID  . warfarin  5 mg Oral ONCE-1800  . Warfarin - Pharmacist Dosing Inpatient   Does not apply q1800   Continuous Infusions: . sodium chloride Stopped (12/01/18 1215)  .  ceFAZolin (ANCEF) IV Stopped (12/01/18 1104)     LOS: 3 days        Flossie Wexler Gerome Apley, MD

## 2018-12-02 LAB — CBC WITH DIFFERENTIAL/PLATELET
ABS IMMATURE GRANULOCYTES: 0.02 10*3/uL (ref 0.00–0.07)
BASOS PCT: 1 %
Basophils Absolute: 0.1 10*3/uL (ref 0.0–0.1)
Eosinophils Absolute: 0.3 10*3/uL (ref 0.0–0.5)
Eosinophils Relative: 6 %
HCT: 30.2 % — ABNORMAL LOW (ref 36.0–46.0)
Hemoglobin: 9.9 g/dL — ABNORMAL LOW (ref 12.0–15.0)
Immature Granulocytes: 0 %
Lymphocytes Relative: 31 %
Lymphs Abs: 1.7 10*3/uL (ref 0.7–4.0)
MCH: 28.7 pg (ref 26.0–34.0)
MCHC: 32.8 g/dL (ref 30.0–36.0)
MCV: 87.5 fL (ref 80.0–100.0)
MONOS PCT: 13 %
Monocytes Absolute: 0.7 10*3/uL (ref 0.1–1.0)
Neutro Abs: 2.7 10*3/uL (ref 1.7–7.7)
Neutrophils Relative %: 49 %
PLATELETS: 212 10*3/uL (ref 150–400)
RBC: 3.45 MIL/uL — ABNORMAL LOW (ref 3.87–5.11)
RDW: 14.7 % (ref 11.5–15.5)
WBC: 5.5 10*3/uL (ref 4.0–10.5)
nRBC: 0 % (ref 0.0–0.2)

## 2018-12-02 LAB — GLUCOSE, CAPILLARY
Glucose-Capillary: 126 mg/dL — ABNORMAL HIGH (ref 70–99)
Glucose-Capillary: 247 mg/dL — ABNORMAL HIGH (ref 70–99)
Glucose-Capillary: 194 mg/dL — ABNORMAL HIGH (ref 70–99)

## 2018-12-02 LAB — BASIC METABOLIC PANEL
Anion gap: 9 (ref 5–15)
BUN: 23 mg/dL (ref 8–23)
CO2: 22 mmol/L (ref 22–32)
Calcium: 8.8 mg/dL — ABNORMAL LOW (ref 8.9–10.3)
Chloride: 106 mmol/L (ref 98–111)
Creatinine, Ser: 1.42 mg/dL — ABNORMAL HIGH (ref 0.44–1.00)
GFR calc Af Amer: 39 mL/min — ABNORMAL LOW (ref >60)
GFR calc non Af Amer: 34 mL/min — ABNORMAL LOW (ref >60)
Glucose, Bld: 127 mg/dL — ABNORMAL HIGH (ref 70–99)
Potassium: 4.4 mmol/L (ref 3.5–5.1)
Sodium: 137 mmol/L (ref 135–145)

## 2018-12-02 LAB — PROTIME-INR
INR: 2.1 — AB (ref 0.8–1.2)
Prothrombin Time: 23.2 seconds — ABNORMAL HIGH (ref 11.4–15.2)

## 2018-12-02 MED ORDER — CEPHALEXIN 250 MG PO CAPS
500.0000 mg | ORAL_CAPSULE | Freq: Four times a day (QID) | ORAL | Status: DC
  Administered 2018-12-02: 500 mg via ORAL
  Filled 2018-12-02: qty 2

## 2018-12-02 MED ORDER — WARFARIN SODIUM 5 MG PO TABS: 5.0000 mg | ORAL_TABLET | Freq: Once | ORAL | Status: DC

## 2018-12-02 MED ORDER — CEPHALEXIN 500 MG PO CAPS: 500.0000 mg | ORAL_CAPSULE | Freq: Four times a day (QID) | ORAL | 0 refills | Status: AC

## 2018-12-02 NOTE — Progress Notes (Signed)
Discharge and medication instructions given to patient. Medication and diet education given to patient with teach back. Peripheral IV removed, clean dry and intact, pressure and dressing applied.  Patient and spouse questions and concerns were answered.  Patients belongings with patient and spouse: glasses, dentures, clothes and jewelery.  Patient was taken in a wheelchair by nursing student in the [place] parking where spouse was waiting.  CCMD called.

## 2018-12-02 NOTE — Discharge Summary (Signed)
Physician Discharge Summary  [name] PFY:[id number] DOB: December 08, 1932 DOA: 11/27/2018  PCP: Gaynelle Arabian, MD  Admit date: 11/27/2018 Discharge date: 12/02/2018  Admitted From: Home  Disposition:  Home   Recommendations for Outpatient Follow-up and new medication changes:  1. Follow up with Dr. Marisue Humble in 7 days.  2. Continue antibiotic therapy with Cephalexin for 5 more days. (patient allergic to penicillin but tolerated cephalosporins well).   Home Health: no   Equipment/Devices: no    Discharge Condition: stable  CODE STATUS: full  Diet recommendation: heart healthy diet   Brief/Interim Summary: 85 year old female who presented with nausea, vomiting and generalized weakness.  She does has significant past medical history for atrial fibrillation, type 2 diabetes mellitus, hypertension, mechanical aortic valve replacement, and chronic kidney disease stage III.  She reported 2 days of nausea and vomiting, associated with dysuria and generalized malaise.  Her initial physical examination she was febrile 104.1 F, blood pressure 92/42, pulse rate 92, respiratory arte 30, oxygen saturation 94% on room air. Her lungs were clear to auscultation bilaterally, heart S1-S2 present rhythmic, positive ejection systolic murmur, and mechanical click at the right sternal border, abdomen was soft, and no lower extremity edema.  Sodium 130, potassium 3.7, chloride 100, bicarb 21, glucose 183, BUN 29, creatinine 1.62, venous lactic acid 2.4, white cell count 10.5, hemoglobin 11.6, hematocrit 35.6,  platelets 171. INR 2.3. Her urinalysis had greater than 50 white cells, 30 protein.  Her EKG had 100 bpm, left axis deviation, ectopic atrial rhythm, left bundle branch block and poor R wave progression.  Patient was admitted to the hospital with a working diagnosis of sepsis due to urinary tract infection.  1.  Sepsis due to gram-negative bacteremia (E. Coli) related to urinary tract infection, present on  admission.  Patient was admitted to the medical ward, she was placed on a remote telemetry monitor, received IV fluids with isotonic saline and IV antibiotic therapy, with IV ceftriaxone.  Her blood cultures turned positive for E. coli, pansensitive. Antibiotic therapy was narrowed to IV cefazolin. Her white cell count peaked up to 23.2. She responded well to medical therapy and her repeat blood cultures from March 10 have been so far with no growth.  Her discharge white cell count is down to 5.5 and she has remained afebrile.  Patient underwent further work-up with echocardiography which showed LV ejection fraction 60 to 65% with moderate LVH, normal wall motion and normal function of mechanical aortic valve.  Possible mitral valve with small mobile vegetation in the anterior leaflet and small ASD suggested by color Doppler.  Further transesophageal echocardiography was negative for vegetations.  Patient will complete 5 more days of antibiotic therapy with Cephalexin.   2.  Acute on chronic diastolic heart failure/status post aortic mechanical valve replacement.  Patient received furosemide, negative fluid balance was achieved with improvement of her symptoms.  She does have a normal LV systolic function.  Likely volume overload related to fluid resuscitation.  Arctic valve working well per echocardiogram. At discharge will resume oral dose of furosemide 20 mg daily. Continue b blocker and ARB therapy.   3.  Chronic kidney disease stage III with hypokalemia.  Peak creatinine 1.62, at discharge is down to 1.42, potassium 4.4 and serum bicarbonate 22.  Follow-up kidney function as an outpatient. Potassium was corrected with Kcl.  4.  Paroxysmal atrial fibrillation.  Remains well controlled with metoprolol, and digoxin.  Patient will continue anticoagulation with warfarin. Her discharge INR is 2,1.  5.  Hypertension.  Continue blood pressure control with losartan, isosorbide and metoprolol.  6.  Type 2  diabetes mellitus. Glucose remained well controlled with insulin sliding scale. Her fasting glucose at discharge is 127 mg/dl. At discharge patient will resume canagliflozin.   7. Dyslipidemia. Continue atorvastatin and ezetimibe.   Discharge Diagnoses:  Principal Problem:   Sepsis ([place]) Active Problems:   S/P AVR (aortic valve replacement)   Coronary Artery Disease   Type II diabetes mellitus with renal manifestations (HCC)   CKD (chronic kidney disease), stage III (HCC)   PAF (paroxysmal atrial fibrillation) (HCC)   AVM (arteriovenous malformation) of small bowel, acquired   UTI (urinary tract infection)   Nausea vomiting and diarrhea   Chronic diastolic CHF (congestive heart failure) ([place])    Discharge Instructions   Allergies as of 12/02/2018      Reactions   Darvon [propoxyphene] Nausea And Vomiting   Lisinopril Cough   Septra [sulfamethoxazole-trimethoprim] Other (See Comments)   Increased INR   Warfarin And Related Other (See Comments)   ONLY TOLERATES BRAND   Penicillins Rash      Medication List    TAKE these medications   aspirin EC 81 MG tablet Take 1 tablet (81 mg total) by mouth daily.   atorvastatin 80 MG tablet Commonly known as:  LIPITOR Take 1 tablet (80 mg total) by mouth daily. Please make appt for future refills 1st attempt.   bevacizumab 1.25 mg/0.1 mL Soln Commonly known as:  AVASTIN Place 1.25 mg into the left eye every 8 (eight) weeks.   cephALEXin 500 MG capsule Commonly known as:  KEFLEX Take 1 capsule (500 mg total) by mouth every 6 (six) hours for 5 days.   Coumadin 5 MG tablet Generic drug:  warfarin Take as directed. If you are unsure how to take this medication, talk to your nurse or doctor. Original instructions:  USE AS DIRECTED BY  COUMADIN  CLINIC What changed:  See the new instructions.   digoxin 0.125 MG tablet Commonly known as:  LANOXIN Take 0.125 mg by mouth daily.   ezetimibe 10 MG tablet Commonly known as:   ZETIA Take 1 tablet (10 mg total) by mouth daily. Please make appt for future refills 1st attempt.   folic acid 161 MCG tablet Commonly known as:  FOLVITE Take 400 mcg by mouth every evening.   furosemide 40 MG tablet Commonly known as:  LASIX Take 40 mg by mouth daily.   Invokana 100 MG Tabs tablet Generic drug:  canagliflozin Take 100 mg by mouth daily.   isosorbide mononitrate 60 MG 24 hr tablet Commonly known as:  IMDUR Take 1 tablet (60 mg total) by mouth daily. Please call and schedule an appt for further refills. 2nd attempt   losartan 50 MG tablet Commonly known as:  COZAAR Take 50 mg by mouth daily.   metoprolol tartrate 25 MG tablet Commonly known as:  LOPRESSOR Take 0.5 tablets (12.5 mg total) by mouth 2 (two) times daily.   Mometasone Furoate 100 MCG/ACT Aero Commonly known as:  Asmanex HFA Inhale 1 puff into the lungs 2 (two) times daily.   multivitamin with minerals Tabs tablet Take 1 tablet by mouth daily.   nitroGLYCERIN 0.4 MG SL tablet Commonly known as:  NITROSTAT Place 1 tablet (0.4 mg total) under the tongue every 5 (five) minutes as needed for chest pain. Please make appt for January. 1st attempt What changed:  additional instructions   Omega-3 Fatty Acids 1200 MG Caps  Take 1 capsule by mouth 2 (two) times daily.   omeprazole 20 MG capsule Commonly known as:  PRILOSEC Take 1 capsule (20 mg total) by mouth daily.   potassium chloride 10 MEQ tablet Commonly known as:  K-DUR,KLOR-CON Take 20 mEq by mouth 2 (two) times daily.   PRESERVISION AREDS 2 PO Take 1 tablet by mouth 2 (two) times daily.   Se-Tan PLUS 162-115.2-1 MG Caps Take 1 tablet by mouth 2 (two) times daily.      Follow-up Information    Gaynelle Arabian, MD. Go on 12/10/2018.   Specialty:  Family Medicine Why:  @11 :45am Contact information: [address] [place] [address] [phone]          Allergies  Allergen Reactions  . Darvon  [Propoxyphene] Nausea And Vomiting  . Lisinopril Cough  . Septra [Sulfamethoxazole-Trimethoprim] Other (See Comments)    Increased INR  . Warfarin And Related Other (See Comments)    ONLY TOLERATES BRAND  . Penicillins Rash    Consultations:  Cardiology for TEE    Procedures/Studies: Dg Chest Port 1 View  Result Date: 11/27/2018 CLINICAL DATA:  The patient feels cold.  Vomiting.  Fever. EXAM: PORTABLE CHEST 1 VIEW COMPARISON:  October 01, 2018 FINDINGS: The heart size and mediastinal contours are within normal limits. Both lungs are clear. The visualized skeletal structures are unremarkable. IMPRESSION: No active disease. Electronically Signed   By: Dorise Bullion III M.D   On: 11/27/2018 23:25   Vas US Carotid  Result Date: 11/11/2018 Carotid Arterial Duplex Study Indications:       Known carotid artery stenosis. Risk Factors:      Hypertension, hyperlipidemia, Diabetes, no history of                    smoking, coronary artery disease. Comparison Study:  Previous carotid duplex performed in 8/19 showed the right                    ICA with velocities of 263/47 cm/sec and the left ICA with                    velocities of 111/12 cm/sec. Performing Technologist: Mariane Masters RVT  Examination Guidelines: A complete evaluation includes B-mode imaging, spectral Doppler, color Doppler, and power Doppler as needed of all accessible portions of each vessel. Bilateral testing is considered an integral part of a complete examination. Limited examinations for reoccurring indications may be performed as noted.  Right Carotid Findings: +----------+--------+--------+--------+-------------------------+--------------+           PSV cm/sEDV cm/sStenosisDescribe                 Comments       +----------+--------+--------+--------+-------------------------+--------------+ CCA Prox  78      1                                                        +----------+--------+--------+--------+-------------------------+--------------+ CCA Distal52      9                                                       +----------+--------+--------+--------+-------------------------+--------------+  ICA Prox  226     40      40-59%  irregular and                                                             heterogenous                            +----------+--------+--------+--------+-------------------------+--------------+ ICA Mid   158     29                                       turbulent flow +----------+--------+--------+--------+-------------------------+--------------+ ICA Distal196     36                                       tortuous       +----------+--------+--------+--------+-------------------------+--------------+ ECA       94      0                                                       +----------+--------+--------+--------+-------------------------+--------------+ +----------+--------+-------+----------------+-------------------+           PSV cm/sEDV cmsDescribe        Arm Pressure (mmHG) +----------+--------+-------+----------------+-------------------+ Subclavian120            Multiphasic, QIH474                 +----------+--------+-------+----------------+-------------------+ +---------+--------+--+--------+--+---------+ VertebralPSV cm/s67EDV cm/s11Antegrade +---------+--------+--+--------+--+---------+  Left Carotid Findings: +----------+--------+--------+--------+------------------------+--------+           PSV cm/sEDV cm/sStenosisDescribe                Comments +----------+--------+--------+--------+------------------------+--------+ CCA Prox  100     0                                                +----------+--------+--------+--------+------------------------+--------+ CCA Distal50      9                                                 +----------+--------+--------+--------+------------------------+--------+ ICA Prox  108     13      1-39%   hyperechoic and calcific         +----------+--------+--------+--------+------------------------+--------+ ICA Mid   95      20                                               +----------+--------+--------+--------+------------------------+--------+ ICA Distal128     27                                               +----------+--------+--------+--------+------------------------+--------+  ECA       112     1                                                +----------+--------+--------+--------+------------------------+--------+ +----------+--------+--------+--------+-------------------+ SubclavianPSV cm/sEDV cm/sDescribeArm Pressure (mmHG) +----------+--------+--------+--------+-------------------+           359             Stenotic110                 +----------+--------+--------+--------+-------------------+ +---------+--------+--+--------+-+---------+ VertebralPSV cm/s48EDV cm/s8Antegrade +---------+--------+--+--------+-+---------+  Summary: Right Carotid: Velocities in the right ICA are consistent with a 40-59%                stenosis. Left Carotid: Velocities in the left ICA are consistent with a 1-39% stenosis. Vertebrals:  Bilateral vertebral arteries demonstrate antegrade flow. Subclavians: Left subclavian artery was stenotic. Normal flow hemodynamics were              seen in the right subclavian artery. *See table(s) above for measurements and observations. Suggest follow up study in 12 months. Electronically signed by Carlyle Dolly MD on 11/11/2018 at 4:33:50 PM.    Final        Subjective: Patient is feeling well, no nausea or vomiting, no chest pain or dyspnea. Has been tolerating po well and is out of the bed to the chair.   Discharge Exam: Vitals:   12/02/18 0740 12/02/18 1000  BP: (!) 145/65 (!) 153/90  Pulse: 71 (!) 106  Resp: 18    Temp: 97.7 F (36.5 C)   SpO2: 98%    Vitals:   12/02/18 0639 12/02/18 0645 12/02/18 0740 12/02/18 1000  BP: 132/74  (!) 145/65 (!) 153/90  Pulse: 67  71 (!) 106  Resp:   18   Temp: 98.3 F (36.8 C)  97.7 F (36.5 C)   TempSrc: Oral  Oral   SpO2: 98%  98%   Weight:  59.6 kg    Height:        General: Not in pain or dyspnea Neurology: Awake and alert, non focal  E ENT: no pallor, no icterus, oral mucosa moist Cardiovascular: No JVD. S1-S2 present, rhythmic, no gallops, or rubs, positive aortic click from mechanical valve. Trace non pitting lower extremity edema. Pulmonary: positive breath sounds bilaterally, adequate air movement, no wheezing, rhonchi or rales. Gastrointestinal. Abdomen flat, no organomegaly, non tender, no rebound or guarding Skin. No rashes Musculoskeletal: no joint deformities   The results of significant diagnostics from this hospitalization (including imaging, microbiology, ancillary and laboratory) are listed below for reference.     Microbiology: Recent Results (from the past 240 hour(s))  Blood Culture (routine x 2)     Status: Abnormal   Collection Time: 11/27/18 10:51 PM  Result Value Ref Range Status   Specimen Description BLOOD RIGHT FOREARM  Final   Special Requests   Final    BOTTLES DRAWN AEROBIC AND ANAEROBIC Blood Culture results may not be optimal due to an inadequate volume of blood received in culture bottles   Culture  Setup Time   Final    GRAM NEGATIVE RODS AEROBIC BOTTLE ONLY CRITICAL VALUE NOTED.  VALUE IS CONSISTENT WITH PREVIOUSLY REPORTED AND CALLED VALUE.    Culture (A)  Final    ESCHERICHIA COLI SUSCEPTIBILITIES PERFORMED ON PREVIOUS CULTURE WITHIN THE LAST 5 DAYS. Performed at  [facility], [place] [address] [place], [place] [address]    Report Status 11/30/2018 FINAL  Final  Blood Culture (routine x 2)     Status: Abnormal   Collection Time: 11/27/18 11:32 PM  Result Value Ref Range Status   Specimen  Description BLOOD LEFT WRIST  Final   Special Requests   Final    BOTTLES DRAWN AEROBIC AND ANAEROBIC Blood Culture results may not be optimal due to an inadequate volume of blood received in culture bottles   Culture  Setup Time   Final    GRAM NEGATIVE RODS IN BOTH AEROBIC AND ANAEROBIC BOTTLES CRITICAL RESULT CALLED TO, READ BACK BY AND VERIFIED WITH: Ailene Rud [id number] AT 1403 BY CM Performed at [facility], [place] [address], [place], [place] [address]    Culture ESCHERICHIA COLI (A)  Final   Report Status 11/30/2018 FINAL  Final   Organism ID, Bacteria ESCHERICHIA COLI  Final      Susceptibility   Escherichia coli - MIC*    AMPICILLIN <=2 SENSITIVE Sensitive     CEFAZOLIN <=4 SENSITIVE Sensitive     CEFEPIME <=1 SENSITIVE Sensitive     CEFTAZIDIME <=1 SENSITIVE Sensitive     CEFTRIAXONE <=1 SENSITIVE Sensitive     CIPROFLOXACIN <=0.25 SENSITIVE Sensitive     GENTAMICIN <=1 SENSITIVE Sensitive     IMIPENEM <=0.25 SENSITIVE Sensitive     TRIMETH/SULFA <=20 SENSITIVE Sensitive     AMPICILLIN/SULBACTAM <=2 SENSITIVE Sensitive     PIP/TAZO <=4 SENSITIVE Sensitive     Extended ESBL NEGATIVE Sensitive     * ESCHERICHIA COLI  Blood Culture ID Panel (Reflexed)     Status: Abnormal   Collection Time: 11/27/18 11:32 PM  Result Value Ref Range Status   Enterococcus species NOT DETECTED NOT DETECTED Final   Listeria monocytogenes NOT DETECTED NOT DETECTED Final   Staphylococcus species NOT DETECTED NOT DETECTED Final   Staphylococcus aureus (BCID) NOT DETECTED NOT DETECTED Final   Streptococcus species NOT DETECTED NOT DETECTED Final   Streptococcus agalactiae NOT DETECTED NOT DETECTED Final   Streptococcus pneumoniae NOT DETECTED NOT DETECTED Final   Streptococcus pyogenes NOT DETECTED NOT DETECTED Final   Acinetobacter baumannii NOT DETECTED NOT DETECTED Final   Enterobacteriaceae species DETECTED (A) NOT DETECTED Final    Comment: Enterobacteriaceae represent a large  family of gram-negative bacteria, not a single organism. CRITICAL RESULT CALLED TO, READ BACK BY AND VERIFIED WITH: PHARMD M [place] [id number] AT 1402 BY CM    Enterobacter cloacae complex NOT DETECTED NOT DETECTED Final   Escherichia coli DETECTED (A) NOT DETECTED Final    Comment: CRITICAL RESULT CALLED TO, READ BACK BY AND VERIFIED WITH: PHARMD M [place] [id number] AT 5427 BY CM    Klebsiella oxytoca NOT DETECTED NOT DETECTED Final   Klebsiella pneumoniae NOT DETECTED NOT DETECTED Final   Proteus species NOT DETECTED NOT DETECTED Final   Serratia marcescens NOT DETECTED NOT DETECTED Final   Carbapenem resistance NOT DETECTED NOT DETECTED Final   Haemophilus influenzae NOT DETECTED NOT DETECTED Final   Neisseria meningitidis NOT DETECTED NOT DETECTED Final   Pseudomonas aeruginosa NOT DETECTED NOT DETECTED Final   Candida albicans NOT DETECTED NOT DETECTED Final   Candida glabrata NOT DETECTED NOT DETECTED Final   Candida krusei NOT DETECTED NOT DETECTED Final   Candida parapsilosis NOT DETECTED NOT DETECTED Final   Candida tropicalis NOT DETECTED NOT DETECTED Final    Comment: Performed at [facility]  Lab, [address] [place], [place] [address]  MRSA PCR Screening     Status: None   Collection Time: 11/28/18  7:58 AM  Result Value Ref Range Status   MRSA by PCR NEGATIVE NEGATIVE Final    Comment:        The GeneXpert MRSA Assay (FDA approved for NASAL specimens only), is one component of a comprehensive MRSA colonization surveillance program. It is not intended to diagnose MRSA infection nor to guide or monitor treatment for MRSA infections. Performed at [facility], [place] [address] [place], [place] [address]   Urine culture     Status: None   Collection Time: 11/28/18  8:40 PM  Result Value Ref Range Status   Specimen Description URINE, RANDOM  Final   Special Requests NONE  Final   Culture   Final    NO GROWTH Performed at [facility], [place] [address], [place], [place] [address]    Report Status 11/30/2018 FINAL  Final  Culture, blood (Routine X 2) w Reflex to ID Panel     Status: None (Preliminary result)   Collection Time: 11/30/18 10:20 AM  Result Value Ref Range Status   Specimen Description BLOOD LEFT ARM  Final   Special Requests   Final    BOTTLES DRAWN AEROBIC ONLY Blood Culture adequate volume   Culture   Final    NO GROWTH 2 DAYS Performed at [facility], [place] [address], [place], [place] [address]    Report Status PENDING  Incomplete  Culture, blood (Routine X 2) w Reflex to ID Panel     Status: None (Preliminary result)   Collection Time: 11/30/18 10:45 AM  Result Value Ref Range Status   Specimen Description BLOOD LEFT HAND  Final   Special Requests   Final    BOTTLES DRAWN AEROBIC ONLY Blood Culture adequate volume   Culture   Final    NO GROWTH 2 DAYS Performed at [facility], [place] [address], [place], [place] [address]    Report Status PENDING  Incomplete     Labs: BNP (last 3 results) Recent Labs    10/01/18 2025  BNP 510.2*   Basic Metabolic Panel: Recent Labs  Lab 11/28/18 0335 11/28/18 1250 11/29/18 0528 11/30/18 0447 12/02/18 0537  NA 136 134* 137 136 137  K 4.1 4.6 4.8 4.5 4.4  CL 107 107 108 105 106  CO2 20* 19* 22 22 22   GLUCOSE 208* 226* 166* 144* 127*  BUN 28* 26* 26* 26* 23  CREATININE 1.51* 1.35* 1.41* 1.38* 1.42*  CALCIUM 8.0* 8.6* 8.8* 8.7* 8.8*   Liver Function Tests: Recent Labs  Lab 11/27/18 2255 11/28/18 0335 11/28/18 1250 11/29/18 0528  AST 37 30 32 34  ALT 31 26 27 25   ALKPHOS 78 63 71 83  BILITOT 1.3* 1.4* 1.3* 0.9  PROT 6.8 5.9* 6.0* 6.2*  ALBUMIN 3.8 3.2* 3.1* 3.0*   No results for input(s): LIPASE, AMYLASE in the last 168 hours. No results for input(s): AMMONIA in the last 168 hours. CBC: Recent Labs  Lab 11/27/18 2255 11/28/18 0335 11/29/18 0528 11/30/18 0447 12/02/18 0537  WBC 10.5 16.1* 23.2* 15.8* 5.5  NEUTROABS 9.9*  --   --   --  2.7   HGB 11.6* 10.3* 11.1* 9.7* 9.9*  HCT 35.6* 32.1* 35.8* 30.7* 30.2*  MCV 89.0 89.7 91.3 89.0 87.5  PLT 171 154 168 160 212   Cardiac Enzymes: No results  for input(s): CKTOTAL, CKMB, CKMBINDEX, TROPONINI in the last 168 hours. BNP: Invalid input(s): POCBNP CBG: Recent Labs  Lab 12/01/18 1316 12/01/18 1715 12/01/18 2200 12/02/18 0644 12/02/18 0833  GLUCAP 173* 139* 152* 126* 247*   D-Dimer No results for input(s): DDIMER in the last 72 hours. Hgb A1c No results for input(s): HGBA1C in the last 72 hours. Lipid Profile No results for input(s): CHOL, HDL, LDLCALC, TRIG, CHOLHDL, LDLDIRECT in the last 72 hours. Thyroid function studies No results for input(s): TSH, T4TOTAL, T3FREE, THYROIDAB in the last 72 hours.  Invalid input(s): FREET3 Anemia work up No results for input(s): VITAMINB12, FOLATE, FERRITIN, TIBC, IRON, RETICCTPCT in the last 72 hours. Urinalysis    Component Value Date/Time   COLORURINE YELLOW 11/28/2018 0058   APPEARANCEUR HAZY (A) 11/28/2018 0058   LABSPEC 1.014 11/28/2018 0058   PHURINE 6.0 11/28/2018 0058   GLUCOSEU >=500 (A) 11/28/2018 0058   HGBUR MODERATE (A) 11/28/2018 0058   BILIRUBINUR NEGATIVE 11/28/2018 0058   KETONESUR NEGATIVE 11/28/2018 0058   PROTEINUR 30 (A) 11/28/2018 0058   UROBILINOGEN 0.2 04/20/2008 2135   NITRITE POSITIVE (A) 11/28/2018 0058   LEUKOCYTESUR MODERATE (A) 11/28/2018 0058   Sepsis Labs Invalid input(s): PROCALCITONIN,  WBC,  LACTICIDVEN Microbiology Recent Results (from the past 240 hour(s))  Blood Culture (routine x 2)     Status: Abnormal   Collection Time: 11/27/18 10:51 PM  Result Value Ref Range Status   Specimen Description BLOOD RIGHT FOREARM  Final   Special Requests   Final    BOTTLES DRAWN AEROBIC AND ANAEROBIC Blood Culture results may not be optimal due to an inadequate volume of blood received in culture bottles   Culture  Setup Time   Final    GRAM NEGATIVE RODS AEROBIC BOTTLE ONLY CRITICAL VALUE  NOTED.  VALUE IS CONSISTENT WITH PREVIOUSLY REPORTED AND CALLED VALUE.    Culture (A)  Final    ESCHERICHIA COLI SUSCEPTIBILITIES PERFORMED ON PREVIOUS CULTURE WITHIN THE LAST 5 DAYS. Performed at [facility], [place] [address], [place], [place] [address]    Report Status 11/30/2018 FINAL  Final  Blood Culture (routine x 2)     Status: Abnormal   Collection Time: 11/27/18 11:32 PM  Result Value Ref Range Status   Specimen Description BLOOD LEFT WRIST  Final   Special Requests   Final    BOTTLES DRAWN AEROBIC AND ANAEROBIC Blood Culture results may not be optimal due to an inadequate volume of blood received in culture bottles   Culture  Setup Time   Final    GRAM NEGATIVE RODS IN BOTH AEROBIC AND ANAEROBIC BOTTLES CRITICAL RESULT CALLED TO, READ BACK BY AND VERIFIED WITH: Ailene Rud [id number] AT 1403 BY CM Performed at [facility], [place] [address] [place],  [address]    Culture ESCHERICHIA COLI (A)  Final   Report Status 11/30/2018 FINAL  Final   Organism ID, Bacteria ESCHERICHIA COLI  Final      Susceptibility   Escherichia coli - MIC*    AMPICILLIN <=2 SENSITIVE Sensitive     CEFAZOLIN <=4 SENSITIVE Sensitive     CEFEPIME <=1 SENSITIVE Sensitive     CEFTAZIDIME <=1 SENSITIVE Sensitive     CEFTRIAXONE <=1 SENSITIVE Sensitive     CIPROFLOXACIN <=0.25 SENSITIVE Sensitive     GENTAMICIN <=1 SENSITIVE Sensitive     IMIPENEM <=0.25 SENSITIVE Sensitive     TRIMETH/SULFA <=20 SENSITIVE Sensitive     AMPICILLIN/SULBACTAM <=2 SENSITIVE Sensitive  PIP/TAZO <=4 SENSITIVE Sensitive     Extended ESBL NEGATIVE Sensitive     * ESCHERICHIA COLI  Blood Culture ID Panel (Reflexed)     Status: Abnormal   Collection Time: 11/27/18 11:32 PM  Result Value Ref Range Status   Enterococcus species NOT DETECTED NOT DETECTED Final   Listeria monocytogenes NOT DETECTED NOT DETECTED Final   Staphylococcus species NOT DETECTED NOT DETECTED Final   Staphylococcus aureus (BCID)  NOT DETECTED NOT DETECTED Final   Streptococcus species NOT DETECTED NOT DETECTED Final   Streptococcus agalactiae NOT DETECTED NOT DETECTED Final   Streptococcus pneumoniae NOT DETECTED NOT DETECTED Final   Streptococcus pyogenes NOT DETECTED NOT DETECTED Final   Acinetobacter baumannii NOT DETECTED NOT DETECTED Final   Enterobacteriaceae species DETECTED (A) NOT DETECTED Final    Comment: Enterobacteriaceae represent a large family of gram-negative bacteria, not a single organism. CRITICAL RESULT CALLED TO, READ BACK BY AND VERIFIED WITH: PHARMD M Zalma [id number] AT 1402 BY CM    Enterobacter cloacae complex NOT DETECTED NOT DETECTED Final   Escherichia coli DETECTED (A) NOT DETECTED Final    Comment: CRITICAL RESULT CALLED TO, READ BACK BY AND VERIFIED WITH: PHARMD M [place] [id number] AT 0177 BY CM    Klebsiella oxytoca NOT DETECTED NOT DETECTED Final   Klebsiella pneumoniae NOT DETECTED NOT DETECTED Final   Proteus species NOT DETECTED NOT DETECTED Final   Serratia marcescens NOT DETECTED NOT DETECTED Final   Carbapenem resistance NOT DETECTED NOT DETECTED Final   Haemophilus influenzae NOT DETECTED NOT DETECTED Final   Neisseria meningitidis NOT DETECTED NOT DETECTED Final   Pseudomonas aeruginosa NOT DETECTED NOT DETECTED Final   Candida albicans NOT DETECTED NOT DETECTED Final   Candida glabrata NOT DETECTED NOT DETECTED Final   Candida krusei NOT DETECTED NOT DETECTED Final   Candida parapsilosis NOT DETECTED NOT DETECTED Final   Candida tropicalis NOT DETECTED NOT DETECTED Final    Comment: Performed at [facility], [place] [address], [place], [place] [address]  MRSA PCR Screening     Status: None   Collection Time: 11/28/18  7:58 AM  Result Value Ref Range Status   MRSA by PCR NEGATIVE NEGATIVE Final    Comment:        The GeneXpert MRSA Assay (FDA approved for NASAL specimens only), is one component of a comprehensive MRSA colonization surveillance program. It is  not intended to diagnose MRSA infection nor to guide or monitor treatment for MRSA infections. Performed at [facility], [place] [address] [place], [place] [address]   Urine culture     Status: None   Collection Time: 11/28/18  8:40 PM  Result Value Ref Range Status   Specimen Description URINE, RANDOM  Final   Special Requests NONE  Final   Culture   Final    NO GROWTH Performed at [facility], [place] [address] [place], [place] [address]    Report Status 11/30/2018 FINAL  Final  Culture, blood (Routine X 2) w Reflex to ID Panel     Status: None (Preliminary result)   Collection Time: 11/30/18 10:20 AM  Result Value Ref Range Status   Specimen Description BLOOD LEFT ARM  Final   Special Requests   Final    BOTTLES DRAWN AEROBIC ONLY Blood Culture adequate volume   Culture   Final    NO GROWTH 2 DAYS Performed at [facility], [place] [address] [place], [place] [address]  Report Status PENDING  Incomplete  Culture, blood (Routine X 2) w Reflex to ID Panel     Status: None (Preliminary result)   Collection Time: 11/30/18 10:45 AM  Result Value Ref Range Status   Specimen Description BLOOD LEFT HAND  Final   Special Requests   Final    BOTTLES DRAWN AEROBIC ONLY Blood Culture adequate volume   Culture   Final    NO GROWTH 2 DAYS Performed at [facility], [address], [place], [place] [address]    Report Status PENDING  Incomplete     Time coordinating discharge: 45 minutes  SIGNED:   Tawni Millers, MD  [facility] 12/02/2018, 11:13 AM

## 2018-12-02 NOTE — Progress Notes (Signed)
Physical Therapy Treatment & Discharge Patient Details Name: [name] MRN: [id number] DOB: 31-Jan-1933 Today's Date: 12/02/2018    History of Present Illness Pt is an 85 y.o. female admitted 11/27/18 with nausea and vomiting; worked up for sepsis secondary to UTI. S/p TEE 3/11. PMH includes HTN, DM, gout, CKD III, GIB, CHF, CKD 3, CAD.   PT Comments    Pt progressing well. Independent with mobility and ADLs. Will have necessary support from husband upon return home. Pt has met short-term acute PT goals. Has no further questions or concerns. Will d/c acute PT.   HR up to 135 with ambulation; SpO2 97% on RA   Follow Up Recommendations  No PT follow up     Equipment Recommendations  None recommended by PT    Recommendations for Other Services       Precautions / Restrictions Precautions Precautions: Other (comment) Precaution Comments: Watch HR Restrictions Weight Bearing Restrictions: No    Mobility  Bed Mobility Overal bed mobility: Independent                Transfers Overall transfer level: Independent                  Ambulation/Gait Ambulation/Gait assistance: Independent Gait Distance (Feet): 400 Feet Assistive device: None Gait Pattern/deviations: Step-through pattern;Decreased stride length   Gait velocity interpretation: 1.31 - 2.62 ft/sec, indicative of limited community ambulator General Gait Details: Slow, steady gait indep without device. SpO2 97% on RA, HR 135   Stairs Stairs: (Pt declined; reports no concerns managing 2 steps into home; able to perform high marching with single UE support to simulate steps)           Wheelchair Mobility    Modified Rankin (Stroke Patients Only)       Balance Overall balance assessment: Needs assistance   Sitting balance-Leahy Scale: Good       Standing balance-Leahy Scale: Good               High level balance activites: Side stepping;Backward walking;Turns;Sudden stops;Head  turns High Level Balance Comments: No overt instability or LOB with higher level balance tasks            Cognition Arousal/Alertness: Awake/alert Behavior During Therapy: WFL for tasks assessed/performed Overall Cognitive Status: Within Functional Limits for tasks assessed                                        Exercises      General Comments        Pertinent Vitals/Pain Pain Assessment: No/denies pain    Home Living                      Prior Function            PT Goals (current goals can now be found in the care plan section) Progress towards PT goals: Goals met/education completed, patient discharged from PT    Frequency    Min 3X/week      PT Plan Current plan remains appropriate    Co-evaluation              AM-PAC PT "6 Clicks" Mobility   Outcome Measure  Help needed turning from your back to your side while in a flat bed without using bedrails?: None Help needed moving from lying on your back to sitting on the side  of a flat bed without using bedrails?: None Help needed moving to and from a bed to a chair (including a wheelchair)?: None Help needed standing up from a chair using your arms (e.g., wheelchair or bedside chair)?: None Help needed to walk in hospital room?: None Help needed climbing 3-5 steps with a railing? : A Little 6 Click Score: 23    End of Session   Activity Tolerance: Patient tolerated treatment well Patient left: in bed;with call bell/phone within reach;with nursing/sitter in room Nurse Communication: Mobility status PT Visit Diagnosis: Difficulty in walking, not elsewhere classified (R26.2)     Time: 8338-2505 PT Time Calculation (min) (ACUTE ONLY): 12 min  Charges:  $Gait Training: 8-22 mins                    Mabeline Caras, PT, DPT Acute Rehabilitation Services  Pager [phone] Office [place] 12/02/2018, 10:32 AM

## 2018-12-05 LAB — CULTURE, BLOOD (ROUTINE X 2)
Culture: NO GROWTH
Culture: NO GROWTH
Special Requests: ADEQUATE
Special Requests: ADEQUATE

## 2018-12-07 ENCOUNTER — Ambulatory Visit (INDEPENDENT_AMBULATORY_CARE_PROVIDER_SITE_OTHER): Payer: Medicare Other | Admitting: Pharmacist

## 2018-12-07 ENCOUNTER — Other Ambulatory Visit: Payer: Self-pay

## 2018-12-07 DIAGNOSIS — Z5181 Encounter for therapeutic drug level monitoring: Secondary | ICD-10-CM

## 2018-12-07 DIAGNOSIS — I359 Nonrheumatic aortic valve disorder, unspecified: Secondary | ICD-10-CM

## 2018-12-07 DIAGNOSIS — Z952 Presence of prosthetic heart valve: Secondary | ICD-10-CM

## 2018-12-07 LAB — POCT INR: INR: 2.4 (ref 2.0–3.0)

## 2018-12-07 NOTE — Patient Instructions (Signed)
Description   Please continue taking 1 tablet every day except 1/2 tablet on Sunday and Wednesdays.  Pt prefers 3 weeks.  Recheck INR in 3 weeks. Call our office if you have any problems or concerns [phone].

## 2018-12-11 ENCOUNTER — Other Ambulatory Visit: Payer: Self-pay | Admitting: Interventional Cardiology

## 2018-12-27 ENCOUNTER — Telehealth: Payer: Self-pay | Admitting: Pharmacist

## 2018-12-27 DIAGNOSIS — N390 Urinary tract infection, site not specified: Secondary | ICD-10-CM | POA: Diagnosis not present

## 2018-12-27 DIAGNOSIS — I130 Hypertensive heart and chronic kidney disease with heart failure and stage 1 through stage 4 chronic kidney disease, or unspecified chronic kidney disease: Secondary | ICD-10-CM | POA: Diagnosis not present

## 2018-12-27 DIAGNOSIS — E1129 Type 2 diabetes mellitus with other diabetic kidney complication: Secondary | ICD-10-CM | POA: Diagnosis not present

## 2018-12-27 DIAGNOSIS — A4151 Sepsis due to Escherichia coli [E. coli]: Secondary | ICD-10-CM | POA: Diagnosis not present

## 2018-12-27 NOTE — Telephone Encounter (Signed)
New Message    Pts husband is calling and is wondering if the coumadin appt is still okay for them to come too. He says he normally gets a confirmation email of the appt and hasnt   Please call

## 2018-12-27 NOTE — Telephone Encounter (Signed)

## 2018-12-28 ENCOUNTER — Other Ambulatory Visit: Payer: Self-pay

## 2018-12-28 ENCOUNTER — Ambulatory Visit (INDEPENDENT_AMBULATORY_CARE_PROVIDER_SITE_OTHER): Payer: Medicare Other | Admitting: *Deleted

## 2018-12-28 DIAGNOSIS — Z5181 Encounter for therapeutic drug level monitoring: Secondary | ICD-10-CM

## 2018-12-28 DIAGNOSIS — I359 Nonrheumatic aortic valve disorder, unspecified: Secondary | ICD-10-CM | POA: Diagnosis not present

## 2018-12-28 DIAGNOSIS — Z952 Presence of prosthetic heart valve: Secondary | ICD-10-CM | POA: Diagnosis not present

## 2018-12-28 LAB — POCT INR: INR: 2.5 (ref 2.0–3.0)

## 2018-12-29 NOTE — Patient Instructions (Signed)
Description   Called spoke with pt, advised to continue on same dosage 1 tablet every day except 1/2 tablet on Sundays and Wednesdays.  Pt prefers 3 weeks.  Recheck INR in 3 weeks. Call our office if you have any problems or concerns [phone].

## 2019-01-11 DIAGNOSIS — H353221 Exudative age-related macular degeneration, left eye, with active choroidal neovascularization: Secondary | ICD-10-CM | POA: Diagnosis not present

## 2019-01-17 ENCOUNTER — Telehealth: Payer: Self-pay

## 2019-01-17 NOTE — Telephone Encounter (Signed)

## 2019-01-18 ENCOUNTER — Ambulatory Visit (INDEPENDENT_AMBULATORY_CARE_PROVIDER_SITE_OTHER): Payer: Medicare Other | Admitting: *Deleted

## 2019-01-18 ENCOUNTER — Other Ambulatory Visit: Payer: Self-pay

## 2019-01-18 DIAGNOSIS — Z952 Presence of prosthetic heart valve: Secondary | ICD-10-CM | POA: Diagnosis not present

## 2019-01-18 DIAGNOSIS — I359 Nonrheumatic aortic valve disorder, unspecified: Secondary | ICD-10-CM

## 2019-01-18 DIAGNOSIS — Z5181 Encounter for therapeutic drug level monitoring: Secondary | ICD-10-CM

## 2019-01-18 LAB — POCT INR: INR: 2.9 (ref 2.0–3.0)

## 2019-02-02 DIAGNOSIS — I130 Hypertensive heart and chronic kidney disease with heart failure and stage 1 through stage 4 chronic kidney disease, or unspecified chronic kidney disease: Secondary | ICD-10-CM | POA: Diagnosis not present

## 2019-02-02 DIAGNOSIS — I663 Occlusion and stenosis of cerebellar arteries: Secondary | ICD-10-CM | POA: Diagnosis not present

## 2019-02-02 DIAGNOSIS — I509 Heart failure, unspecified: Secondary | ICD-10-CM | POA: Diagnosis not present

## 2019-02-02 DIAGNOSIS — E1129 Type 2 diabetes mellitus with other diabetic kidney complication: Secondary | ICD-10-CM | POA: Diagnosis not present

## 2019-02-02 DIAGNOSIS — I2510 Atherosclerotic heart disease of native coronary artery without angina pectoris: Secondary | ICD-10-CM | POA: Diagnosis not present

## 2019-02-02 DIAGNOSIS — D649 Anemia, unspecified: Secondary | ICD-10-CM | POA: Diagnosis not present

## 2019-02-02 DIAGNOSIS — E1139 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: Secondary | ICD-10-CM | POA: Diagnosis not present

## 2019-02-02 DIAGNOSIS — N183 Chronic kidney disease, stage 3 (moderate): Secondary | ICD-10-CM | POA: Diagnosis not present

## 2019-02-02 DIAGNOSIS — E113299 Type 2 diabetes mellitus with mild nonproliferative diabetic retinopathy without macular edema, unspecified eye: Secondary | ICD-10-CM | POA: Diagnosis not present

## 2019-02-07 ENCOUNTER — Telehealth: Payer: Self-pay

## 2019-02-07 NOTE — Telephone Encounter (Signed)

## 2019-02-08 ENCOUNTER — Ambulatory Visit (INDEPENDENT_AMBULATORY_CARE_PROVIDER_SITE_OTHER): Payer: Medicare Other | Admitting: Pharmacist

## 2019-02-08 ENCOUNTER — Other Ambulatory Visit: Payer: Self-pay

## 2019-02-08 DIAGNOSIS — Z7901 Long term (current) use of anticoagulants: Secondary | ICD-10-CM

## 2019-02-08 DIAGNOSIS — Z952 Presence of prosthetic heart valve: Secondary | ICD-10-CM

## 2019-02-08 DIAGNOSIS — Z5181 Encounter for therapeutic drug level monitoring: Secondary | ICD-10-CM | POA: Diagnosis not present

## 2019-02-08 DIAGNOSIS — I359 Nonrheumatic aortic valve disorder, unspecified: Secondary | ICD-10-CM | POA: Diagnosis not present

## 2019-02-08 LAB — POCT INR: INR: 2.7 (ref 2.0–3.0)

## 2019-02-23 ENCOUNTER — Telehealth: Payer: Self-pay

## 2019-02-23 NOTE — Telephone Encounter (Signed)

## 2019-03-01 ENCOUNTER — Other Ambulatory Visit: Payer: Self-pay

## 2019-03-01 ENCOUNTER — Ambulatory Visit (INDEPENDENT_AMBULATORY_CARE_PROVIDER_SITE_OTHER): Payer: Medicare Other | Admitting: Pharmacist

## 2019-03-01 DIAGNOSIS — Z952 Presence of prosthetic heart valve: Secondary | ICD-10-CM | POA: Diagnosis not present

## 2019-03-01 DIAGNOSIS — Z5181 Encounter for therapeutic drug level monitoring: Secondary | ICD-10-CM

## 2019-03-01 DIAGNOSIS — I359 Nonrheumatic aortic valve disorder, unspecified: Secondary | ICD-10-CM

## 2019-03-01 LAB — POCT INR: INR: 2.2 (ref 2.0–3.0)

## 2019-03-01 NOTE — Patient Instructions (Signed)
Description   Continue on same dosage 1 tablet every day except 1/2 tablet on Sundays and Wednesdays.  Recheck INR in 3 weeks. Call our office if you have any problems or concerns [phone].

## 2019-03-08 DIAGNOSIS — H353221 Exudative age-related macular degeneration, left eye, with active choroidal neovascularization: Secondary | ICD-10-CM | POA: Diagnosis not present

## 2019-03-11 DIAGNOSIS — R300 Dysuria: Secondary | ICD-10-CM | POA: Diagnosis not present

## 2019-03-11 DIAGNOSIS — N3000 Acute cystitis without hematuria: Secondary | ICD-10-CM | POA: Diagnosis not present

## 2019-03-11 DIAGNOSIS — E119 Type 2 diabetes mellitus without complications: Secondary | ICD-10-CM | POA: Diagnosis not present

## 2019-03-12 ENCOUNTER — Other Ambulatory Visit: Payer: Self-pay | Admitting: Interventional Cardiology

## 2019-03-15 ENCOUNTER — Telehealth: Payer: Self-pay

## 2019-03-15 ENCOUNTER — Other Ambulatory Visit: Payer: Self-pay | Admitting: Interventional Cardiology

## 2019-03-15 MED ORDER — ISOSORBIDE MONONITRATE ER 60 MG PO TB24: 60.0000 mg | ORAL_TABLET | Freq: Every day | ORAL | 0 refills | Status: DC

## 2019-03-15 MED ORDER — EZETIMIBE 10 MG PO TABS: 10.0000 mg | ORAL_TABLET | Freq: Every day | ORAL | 0 refills | Status: DC

## 2019-03-15 NOTE — Telephone Encounter (Signed)

## 2019-03-15 NOTE — Telephone Encounter (Signed)
Pt's medications were sent to pt's pharmacy as requested. Confirmation received.  

## 2019-03-15 NOTE — Telephone Encounter (Signed)
 *  STAT* If patient is at the pharmacy, call can be transferred to refill team.   1. Which medications need to be refilled? (please list name of each medication and dose if known)  ezetimibe (ZETIA) 10 MG tablet isosorbide mononitrate (IMDUR) 60 MG 24 hr tablet   2. Which pharmacy/location (including street and city if local pharmacy) is medication to be sent to? [facility]  3. Do they need a 30 day or 90 day supply? [place]

## 2019-03-19 ENCOUNTER — Other Ambulatory Visit: Payer: Self-pay | Admitting: Interventional Cardiology

## 2019-03-22 ENCOUNTER — Ambulatory Visit (INDEPENDENT_AMBULATORY_CARE_PROVIDER_SITE_OTHER): Payer: Medicare Other | Admitting: *Deleted

## 2019-03-22 ENCOUNTER — Other Ambulatory Visit: Payer: Self-pay

## 2019-03-22 DIAGNOSIS — Z952 Presence of prosthetic heart valve: Secondary | ICD-10-CM | POA: Diagnosis not present

## 2019-03-22 DIAGNOSIS — Z5181 Encounter for therapeutic drug level monitoring: Secondary | ICD-10-CM

## 2019-03-22 DIAGNOSIS — I359 Nonrheumatic aortic valve disorder, unspecified: Secondary | ICD-10-CM

## 2019-03-22 LAB — POCT INR: INR: 2.2 (ref 2.0–3.0)

## 2019-03-22 NOTE — Patient Instructions (Signed)
Description   Continue on same dosage 1 tablet every day except 1/2 tablet on Sundays and Wednesdays.  Recheck INR in 3 weeks. Call our office if you have any problems or concerns [phone].

## 2019-03-26 ENCOUNTER — Other Ambulatory Visit: Payer: Self-pay | Admitting: Interventional Cardiology

## 2019-03-28 ENCOUNTER — Telehealth: Payer: Self-pay | Admitting: *Deleted

## 2019-03-28 NOTE — Telephone Encounter (Signed)
Prescription refill request received for Coumadin and refill sent.   Message sent back from pharmacy that the Brand of Coumadin is no longer being manufactured. Reviewed case with pharm D, Melissa and called pt and made her aware. Pharmacist suggested [place] as an option. Called pt and made her aware brand coumadin is not being manufactured, pt stated she can not use generic warfarin and stated she would be willing to try Jantoven. [place] who stated that they would order Jantoven and would call back tomorrow to see if they could get it. Updated pt.

## 2019-03-29 NOTE — Telephone Encounter (Signed)
Spoke to pt who stated that she will be able to pick up her [place] later this week at [facility].

## 2019-04-11 ENCOUNTER — Telehealth: Payer: Self-pay

## 2019-04-11 NOTE — Telephone Encounter (Signed)

## 2019-04-13 ENCOUNTER — Ambulatory Visit (INDEPENDENT_AMBULATORY_CARE_PROVIDER_SITE_OTHER): Payer: Medicare Other | Admitting: *Deleted

## 2019-04-13 ENCOUNTER — Other Ambulatory Visit: Payer: Self-pay

## 2019-04-13 DIAGNOSIS — Z5181 Encounter for therapeutic drug level monitoring: Secondary | ICD-10-CM | POA: Diagnosis not present

## 2019-04-13 DIAGNOSIS — Z952 Presence of prosthetic heart valve: Secondary | ICD-10-CM | POA: Diagnosis not present

## 2019-04-13 DIAGNOSIS — I359 Nonrheumatic aortic valve disorder, unspecified: Secondary | ICD-10-CM | POA: Diagnosis not present

## 2019-04-13 LAB — POCT INR: INR: 1.9 — AB (ref 2.0–3.0)

## 2019-04-13 NOTE — Patient Instructions (Signed)
Description   Today take 1 tablet then continue on same dosage 1 tablet every day except 1/2 tablet on Sundays and Wednesdays. Will start [facility] 04/14/2019. Recheck INR in 2 weeks. Call our office if you have any problems or concerns [phone].

## 2019-04-16 ENCOUNTER — Other Ambulatory Visit: Payer: Self-pay | Admitting: Interventional Cardiology

## 2019-04-26 ENCOUNTER — Ambulatory Visit (INDEPENDENT_AMBULATORY_CARE_PROVIDER_SITE_OTHER): Payer: Medicare Other

## 2019-04-26 ENCOUNTER — Other Ambulatory Visit: Payer: Self-pay

## 2019-04-26 DIAGNOSIS — I359 Nonrheumatic aortic valve disorder, unspecified: Secondary | ICD-10-CM

## 2019-04-26 DIAGNOSIS — Z5181 Encounter for therapeutic drug level monitoring: Secondary | ICD-10-CM | POA: Diagnosis not present

## 2019-04-26 DIAGNOSIS — Z952 Presence of prosthetic heart valve: Secondary | ICD-10-CM | POA: Diagnosis not present

## 2019-04-26 LAB — POCT INR: INR: 2.1 (ref 2.0–3.0)

## 2019-04-26 NOTE — Patient Instructions (Signed)
Description   Continue on same dosage 1 tablet every day except 1/2 tablet on Sundays and Wednesdays. Recheck INR in 3 weeks. Call our office if you have any problems or concerns [phone].

## 2019-05-03 DIAGNOSIS — H35371 Puckering of macula, right eye: Secondary | ICD-10-CM | POA: Diagnosis not present

## 2019-05-03 DIAGNOSIS — E113293 Type 2 diabetes mellitus with mild nonproliferative diabetic retinopathy without macular edema, bilateral: Secondary | ICD-10-CM | POA: Diagnosis not present

## 2019-05-03 DIAGNOSIS — H353221 Exudative age-related macular degeneration, left eye, with active choroidal neovascularization: Secondary | ICD-10-CM | POA: Diagnosis not present

## 2019-05-03 DIAGNOSIS — H353112 Nonexudative age-related macular degeneration, right eye, intermediate dry stage: Secondary | ICD-10-CM | POA: Diagnosis not present

## 2019-05-17 ENCOUNTER — Other Ambulatory Visit: Payer: Self-pay

## 2019-05-17 ENCOUNTER — Ambulatory Visit (INDEPENDENT_AMBULATORY_CARE_PROVIDER_SITE_OTHER): Payer: Medicare Other | Admitting: *Deleted

## 2019-05-17 DIAGNOSIS — Z952 Presence of prosthetic heart valve: Secondary | ICD-10-CM | POA: Diagnosis not present

## 2019-05-17 DIAGNOSIS — Z5181 Encounter for therapeutic drug level monitoring: Secondary | ICD-10-CM | POA: Diagnosis not present

## 2019-05-17 DIAGNOSIS — I359 Nonrheumatic aortic valve disorder, unspecified: Secondary | ICD-10-CM

## 2019-05-17 LAB — POCT INR: INR: 2 (ref 2.0–3.0)

## 2019-05-17 NOTE — Patient Instructions (Signed)
Description   Continue on same dosage 1 tablet every day except 1/2 tablet on Sundays and Wednesdays.  Recheck INR in 3 weeks. Call our office if you have any problems or concerns [phone].       

## 2019-06-07 ENCOUNTER — Ambulatory Visit (INDEPENDENT_AMBULATORY_CARE_PROVIDER_SITE_OTHER): Payer: Medicare Other | Admitting: *Deleted

## 2019-06-07 ENCOUNTER — Other Ambulatory Visit: Payer: Self-pay

## 2019-06-07 DIAGNOSIS — I359 Nonrheumatic aortic valve disorder, unspecified: Secondary | ICD-10-CM

## 2019-06-07 DIAGNOSIS — Z952 Presence of prosthetic heart valve: Secondary | ICD-10-CM | POA: Diagnosis not present

## 2019-06-07 DIAGNOSIS — Z5181 Encounter for therapeutic drug level monitoring: Secondary | ICD-10-CM

## 2019-06-07 LAB — POCT INR: INR: 2.3 (ref 2.0–3.0)

## 2019-06-07 NOTE — Patient Instructions (Signed)
Description   Continue on same dosage 1 tablet every day except 1/2 tablet on Sundays and Wednesdays.  Recheck INR in 3 weeks. Call our office if you have any problems or concerns [phone].       

## 2019-06-08 DIAGNOSIS — N3001 Acute cystitis with hematuria: Secondary | ICD-10-CM | POA: Diagnosis not present

## 2019-06-08 DIAGNOSIS — N3091 Cystitis, unspecified with hematuria: Secondary | ICD-10-CM | POA: Diagnosis not present

## 2019-06-13 ENCOUNTER — Other Ambulatory Visit: Payer: Self-pay | Admitting: Interventional Cardiology

## 2019-06-16 DIAGNOSIS — Z23 Encounter for immunization: Secondary | ICD-10-CM | POA: Diagnosis not present

## 2019-06-21 DIAGNOSIS — H35371 Puckering of macula, right eye: Secondary | ICD-10-CM | POA: Diagnosis not present

## 2019-06-21 DIAGNOSIS — H353221 Exudative age-related macular degeneration, left eye, with active choroidal neovascularization: Secondary | ICD-10-CM | POA: Diagnosis not present

## 2019-06-21 DIAGNOSIS — H43813 Vitreous degeneration, bilateral: Secondary | ICD-10-CM | POA: Diagnosis not present

## 2019-06-21 DIAGNOSIS — H353112 Nonexudative age-related macular degeneration, right eye, intermediate dry stage: Secondary | ICD-10-CM | POA: Diagnosis not present

## 2019-06-28 NOTE — Progress Notes (Signed)
Cardiology Office Note   Date:  06/29/2019   ID:  Renee, [name] [birth year], MRN [id number]  PCP:  Gaynelle Arabian, MD    No chief complaint on file.  CAD  Wt Readings from Last 3 Encounters:  06/29/19 137 lb 6.4 oz (62.3 kg)  12/02/18 131 lb 6.4 oz (59.6 kg)  10/01/18 142 lb 10.2 oz (64.7 kg)       History of Present Illness: [name] is a 85 y.o. female   with a hx of CAD and aortic valve disease status post CABG plus mechanical AVR, subsequent PCI to the RCA with BMS, HTN, HL, diabetes, prior GI bleed, CKD, diastolic CHF, PAF.   She was evaluated in 7/16 for anginal symptoms. LHC demonstrated severe native three-vessel disease. LIMA-LAD was patent, SVG-diagonal patent, SVG-OM/PDA occluded. The second half of the OM/PDA graft was open. RCA stents were patent. She had an ostial RCA lesion of 75% which was treated with a Synergy DES. Because she needs Coumadin for her mechanical AVR, she was continued on Coumadin plus Plavix only (no aspirin).  She had some melenaafter the procedure.  At her 2019 visit:  She did have some chest discomfort 20 minutes into her last workout.  She stopped and then restarted and completed her workout after waiting for a few minutes. Sx did not approach what she had before her last stent.    She feels her balance is getting worse.  No recent falls.   Patient was admitted in March 2020.  Sepsis due to gram-negative bacteremia (E. Coli) related to urinary tract infection, present on admission.  Patient was admitted to the medical ward, she was placed on a remote telemetry monitor, received IV fluids with isotonic saline and IV antibiotic therapy, with IV ceftriaxone.  Her blood cultures turned positive for E. coli, pansensitive. Antibiotic therapy was narrowed to IV cefazolin. Her white cell count peaked up to 23.2. She responded well to medical therapy and her repeat blood cultures from March 10 have been so far with no growth.  Her  discharge white cell count is down to 5.5 and she has remained afebrile.  Patient underwent further work-up with echocardiography which showed LV ejection fraction 60 to 65% with moderate LVH, normal wall motion and normal function of mechanical aortic valve.  Possible mitral valve with small mobile vegetation in the anterior leaflet and small ASD suggested by color Doppler.  Further transesophageal echocardiography was negative for vegetations.  Since the last visit, she has had DOE.  She has been [facility] since the hospital stay but it may be worse of late.   Denies : Chest pain. Dizziness. Leg edema. Nitroglycerin Orthopnea. Palpitations. Paroxysmal nocturnal dyspnea. Syncope.   She just started walking again, after not walking much for several months.  Coumadin has been well regulated.    She has not been checking BP at home.   Past Medical History:  Diagnosis Date  . Anemia   . Anginal pain ([place])   . Arteriovenous malformation of gastrointestinal tract   . Arthritis    "fingers mostly" (04/12/2015)  . Carotid artery disease ([place])    Carotid US 3/17:  97-02% RICA; 63-78% LICA; Elevated bilateral subclavian artery velocities >>f/u 1 year. // Carotid US 4/18: R 40-59; L 1-39 >> FU 1 year // Carotid US 10/2018: R 40-59; L 1-39, L subclavian stenosis   . CHF (congestive heart failure) ([place])   . Chronic kidney disease (CKD), stage III (moderate)   .  Chronic lower GI bleeding    "today; last time was ~ 8 yr ago; used to have them often before that too" (02/11/2013)  . Coronary artery disease   . DJD (degenerative joint disease)   . Dysrhythmia   . Heart murmur   . History of blood transfusion    "a few times over the years; usually related to my Coumadin" (04/12/2015  . History of gout   . Hyperlipemia   . Hypertension   . Macula lutea degeneration   . Old MI (myocardial infarction)    "a coulple /dr in 02/2008; I never even knew I'd had them" (04/12/2015)  . Type II diabetes mellitus ([place])      Past Surgical History:  Procedure Laterality Date  . AORTIC VALVE REPLACEMENT  1993  . APPENDECTOMY  1953  . CARDIAC CATHETERIZATION  ~1990  . CARDIAC CATHETERIZATION  04/12/2015   Procedure: Coronary Stent Intervention;  Surgeon: Jettie Booze, MD;    SYNERGY DES 3.5X16 to the ostial RCA   . CARDIAC CATHETERIZATION  04/12/2015   Procedure: Coronary/Graft Angiography;  Surgeon: Eloy End, MD; LAD & CFX 100%, patent LIMA-LAD, SVG-D1; SVG-OM-PDA first limb 100%, 2nd limb patent; oRCA 75%>0 w/ stent  . [place]   St. Jude/notes 10/29/2003 (02/11/2013)  . COLONOSCOPY N/A 02/13/2013   Procedure: COLONOSCOPY;  Surgeon: Lafayette Dragon, MD;  Location: [facility] ENDOSCOPY;  Service: Endoscopy;  Laterality: N/A;  . CORONARY ANGIOPLASTY WITH STENT PLACEMENT  2009+   "3 at least; put in 1 stent at a time" (02/11/2013)  . CORONARY ARTERY BYPASS GRAFT  1990   LIMA-LAD, SVG-OM-PDA, SVG-D1  . DILATION AND CURETTAGE OF UTERUS  1960's   'after a miscarriage" (02/11/2013)  . ENTEROSCOPY N/A 02/13/2013   Procedure: ENTEROSCOPY;  Surgeon: Lafayette Dragon, MD;  Location: [facility] ENDOSCOPY;  Service: Endoscopy;  Laterality: N/A;  . EXPLORATORY LAPAROTOMY  02/24/2008   which revealed a retroperitoneal hematoma and bleeding from the right external iliac artery/notes 03/02/2008 (02/11/2013)   . FLEXIBLE SIGMOIDOSCOPY N/A 04/21/2015   Procedure: FLEXIBLE SIGMOIDOSCOPY;  Surgeon: Ladene Artist, MD;  Location: [facility] ENDOSCOPY;  Service: Endoscopy;  Laterality: N/A;  . TEE WITHOUT CARDIOVERSION N/A 12/01/2018   Procedure: TRANSESOPHAGEAL ECHOCARDIOGRAM (TEE);  Surgeon: Jerline Pain, MD;  Location: [facility] ENDOSCOPY;  Service: Cardiovascular;  Laterality: N/A;     Current Outpatient Medications  Medication Sig Dispense Refill  . aspirin EC 81 MG tablet Take 1 tablet (81 mg total) by mouth daily.    Marland Kitchen atorvastatin (LIPITOR) 80 MG tablet Take 1 tablet by mouth once daily 90 tablet 0  . bevacizumab  (AVASTIN) 1.25 mg/0.1 mL SOLN Place 1.25 mg into the left eye every 8 (eight) weeks.    . digoxin (LANOXIN) 0.125 MG tablet Take 0.125 mg by mouth daily.    Marland Kitchen ezetimibe (ZETIA) 10 MG tablet TAKE 1 TABLET BY MOUTH ONCE DAILY PLEASE  MAKE  OVERDUE  APPOINTMENT  WITH  DR.  Azazel Franze  BEFORE  ANYMORE  REFILLS 90 tablet 0  . FeFum-FePo-FA-B Cmp-C-Zn-Mn-Cu (SE-TAN PLUS) 162-115.2-1 MG CAPS Take 1 tablet by mouth 2 (two) times daily.  3  . folic acid (FOLVITE) 132 MCG tablet Take 400 mcg by mouth every evening.     . furosemide (LASIX) 40 MG tablet Take 40 mg by mouth daily.    . isosorbide mononitrate (IMDUR) 60 MG 24 hr tablet TAKE 1 TABLET BY MOUTH ONCE DAILY PLEASE  MAKE  OVERDUE  APPOINTMENT  WITH  DR.  Aeneas Longsworth  BEFORE  ANYMORE  REFILLS 90 tablet 0  . losartan (COZAAR) 50 MG tablet Take 50 mg by mouth daily.    . metoprolol tartrate (LOPRESSOR) 25 MG tablet Take 0.5 tablets (12.5 mg total) by mouth 2 (two) times daily. 60 tablet 11  . Multiple Vitamin (MULTIVITAMIN WITH MINERALS) TABS Take 1 tablet by mouth daily.    . Multiple Vitamins-Minerals (PRESERVISION AREDS 2 PO) Take 1 tablet by mouth 2 (two) times daily.     . nitroGLYCERIN (NITROSTAT) 0.4 MG SL tablet Place 1 tablet (0.4 mg total) under the tongue every 5 (five) minutes as needed for chest pain. Please make appt for January. 1st attempt 25 tablet 0  . Omega-3 Fatty Acids 1200 MG CAPS Take 1 capsule by mouth 2 (two) times daily.     . potassium chloride (K-DUR,KLOR-CON) 10 MEQ tablet Take 20 mEq by mouth 2 (two) times daily.     Marland Kitchen warfarin (JANTOVEN) 5 MG tablet Take 5 mg by mouth daily.    Marland Kitchen JANUVIA 50 MG tablet TAKE 1 TABLET BY MOUTH ONCE DAILY FOR DIABETES     No current facility-administered medications for this visit.     Allergies:   Darvon [propoxyphene], Lisinopril, Septra [sulfamethoxazole-trimethoprim], Warfarin and related, and Penicillins    Social History:  The patient  reports that she has never smoked. She has never used  smokeless tobacco. She reports that she does not drink alcohol or use drugs.   Family History:  The patient's family history includes Heart disease in her father and mother.    ROS:  Please see the history of present illness.   Otherwise, review of systems are positive for .   All other systems are reviewed and negative.    PHYSICAL EXAM: VS:  BP (!) 158/64   Pulse 81   Ht 5' (1.524 m)   Wt 137 lb 6.4 oz (62.3 kg)   SpO2 97%   BMI 26.83 kg/m  , BMI Body mass index is 26.83 kg/m. GEN: Well nourished, well developed, in no acute distress  HEENT: normal  Neck: no JVD, carotid bruits, or masses Cardiac: irregularly irregular, crisp S2 click; 2/6 systolic murmur, rubs, or gallops,no edema  Respiratory:  clear to auscultation bilaterally, normal work of breathing GI: soft, nontender, nondistended, + BS MS: no deformity or atrophy  Skin: warm and dry, no rash Neuro:  Strength and sensation are intact Psych: euthymic mood, full affect   EKG:   The ekg ordered today demonstrates AFib, controlled ventricular response   Recent Labs: 10/01/2018: B Natriuretic Peptide 150.0 11/29/2018: ALT 25 12/02/2018: BUN 23; Creatinine, Ser 1.42; Hemoglobin 9.9; Platelets 212; Potassium 4.4; Sodium 137   Lipid Panel    Component Value Date/Time   CHOL 172 05/01/2015 1234   TRIG 248 (H) 05/01/2015 1234   HDL 46 05/01/2015 1234   LDLCALC 76 05/01/2015 1234     Other studies Reviewed: Additional studies/ records that were reviewed today with results demonstrating: hospital records reviewed.   ASSESSMENT AND PLAN:  1. CAD: No clear angina.  DOE in the setting of recent onset AFib. Increase metoprolol to 25 mg BID.  Loss of atrial kick has likely contributed to DOE.  If sx are not better with increased metoprolol, would plan on starting amiodarone with cardioversion a few weeks later.  I doubt she would maintain NSR without AAD.  Of note, she is very sensitive to changes in her coumadin and I think  her INR would have to be monitored very closely if Amio was started.   2. AVR: Anticoagulated for valve.  Had GI bleed in the past.  3. Hyperlipidemia:LDL 54 in 8/19.  Needs recheck at some point whne getting fasting labs.  4. Anticoagulated: Check CMet, lipids and thyroid 5. HTN: High today.  Check readings at home.  Watch for irregular heart beat symbol Chronic diastolic heart fialure: appears euvolemic  Current medicines are reviewed at length with the patient today.  The patient concerns regarding her medicines were addressed.  The following changes have been made:  increase metoprolol  Labs/ tests ordered today include:  No orders of the defined types were placed in this encounter.   Recommend 150 minutes/week of aerobic exercise Low fat, low carb, high fiber diet recommended  Disposition:   FU in 3 weeks, with APP or me.   Signed, Larae Grooms, MD  06/29/2019 9:35 AM    [facility] [place], [place], [place]  [address] Phone: [phone]; Fax: [phone]

## 2019-06-29 ENCOUNTER — Encounter: Payer: Self-pay | Admitting: Interventional Cardiology

## 2019-06-29 ENCOUNTER — Other Ambulatory Visit: Payer: Self-pay

## 2019-06-29 ENCOUNTER — Ambulatory Visit (INDEPENDENT_AMBULATORY_CARE_PROVIDER_SITE_OTHER): Payer: Medicare Other | Admitting: *Deleted

## 2019-06-29 ENCOUNTER — Ambulatory Visit (INDEPENDENT_AMBULATORY_CARE_PROVIDER_SITE_OTHER): Payer: Medicare Other | Admitting: Interventional Cardiology

## 2019-06-29 VITALS — BP 158/64 | HR 81 | Ht 60.0 in | Wt 137.4 lb

## 2019-06-29 DIAGNOSIS — I10 Essential (primary) hypertension: Secondary | ICD-10-CM | POA: Diagnosis not present

## 2019-06-29 DIAGNOSIS — Z7901 Long term (current) use of anticoagulants: Secondary | ICD-10-CM | POA: Diagnosis not present

## 2019-06-29 DIAGNOSIS — Z952 Presence of prosthetic heart valve: Secondary | ICD-10-CM

## 2019-06-29 DIAGNOSIS — I2510 Atherosclerotic heart disease of native coronary artery without angina pectoris: Secondary | ICD-10-CM

## 2019-06-29 DIAGNOSIS — Z5181 Encounter for therapeutic drug level monitoring: Secondary | ICD-10-CM

## 2019-06-29 DIAGNOSIS — Z8719 Personal history of other diseases of the digestive system: Secondary | ICD-10-CM

## 2019-06-29 DIAGNOSIS — I359 Nonrheumatic aortic valve disorder, unspecified: Secondary | ICD-10-CM | POA: Diagnosis not present

## 2019-06-29 DIAGNOSIS — I2583 Coronary atherosclerosis due to lipid rich plaque: Secondary | ICD-10-CM | POA: Diagnosis not present

## 2019-06-29 DIAGNOSIS — E782 Mixed hyperlipidemia: Secondary | ICD-10-CM

## 2019-06-29 DIAGNOSIS — I6523 Occlusion and stenosis of bilateral carotid arteries: Secondary | ICD-10-CM | POA: Diagnosis not present

## 2019-06-29 DIAGNOSIS — I4819 Other persistent atrial fibrillation: Secondary | ICD-10-CM

## 2019-06-29 LAB — LIPID PANEL
Chol/HDL Ratio: 2.3 ratio (ref 0.0–4.4)
Cholesterol, Total: 117 mg/dL (ref 100–199)
HDL: 51 mg/dL (ref >39)
LDL Chol Calc (NIH): 40 mg/dL (ref 0–99)
Triglycerides: 153 mg/dL — ABNORMAL HIGH (ref 0–149)
VLDL Cholesterol Cal: 26 mg/dL (ref 5–40)

## 2019-06-29 LAB — COMPREHENSIVE METABOLIC PANEL
ALT: 24 IU/L (ref 0–32)
AST: 29 IU/L (ref 0–40)
Albumin/Globulin Ratio: 1.9 (ref 1.2–2.2)
Albumin: 4.4 g/dL (ref 3.6–4.6)
Alkaline Phosphatase: 68 IU/L (ref 39–117)
BUN/Creatinine Ratio: 21 (ref 12–28)
BUN: 28 mg/dL — ABNORMAL HIGH (ref 8–27)
Bilirubin Total: 1.2 mg/dL (ref 0.0–1.2)
CO2: 21 mmol/L (ref 20–29)
Calcium: 9.5 mg/dL (ref 8.7–10.3)
Chloride: 104 mmol/L (ref 96–106)
Creatinine, Ser: 1.36 mg/dL — ABNORMAL HIGH (ref 0.57–1.00)
GFR calc Af Amer: 42 mL/min/{1.73_m2} — ABNORMAL LOW (ref >59)
GFR calc non Af Amer: 36 mL/min/{1.73_m2} — ABNORMAL LOW (ref >59)
Globulin, Total: 2.3 g/dL (ref 1.5–4.5)
Glucose: 213 mg/dL — ABNORMAL HIGH (ref 65–99)
Potassium: 5.4 mmol/L — ABNORMAL HIGH (ref 3.5–5.2)
Sodium: 137 mmol/L (ref 134–144)
Total Protein: 6.7 g/dL (ref 6.0–8.5)

## 2019-06-29 LAB — TSH: TSH: 4.26 u[IU]/mL (ref 0.450–4.500)

## 2019-06-29 LAB — POCT INR: INR: 2.3 (ref 2.0–3.0)

## 2019-06-29 MED ORDER — METOPROLOL TARTRATE 25 MG PO TABS: 25.0000 mg | ORAL_TABLET | Freq: Two times a day (BID) | ORAL | 3 refills | Status: DC

## 2019-06-29 NOTE — Patient Instructions (Signed)
Description   Continue on same dosage 1 tablet every day except 1/2 tablet on Sundays and Wednesdays.  Recheck INR in 3 weeks. Call our office if you have any problems or concerns [phone].       

## 2019-06-29 NOTE — Patient Instructions (Addendum)
Medication Instructions:  Your physician has recommended you make the following change in your medication:   INCREASE: metoprolol tartrate (lopressor) 25 mg tablet: Take 1 tablet by mouth twice a day   Lab work: TODAY: TSH, CMET, LIPIDS  If you have labs (blood work) drawn today and your tests are completely normal, you will receive your results only by: Marland Kitchen MyChart Message (if you have MyChart) OR . A paper copy in the mail If you have any lab test that is abnormal or we need to change your treatment, we will call you to review the results.  Testing/Procedures: None ordered  Follow-Up: Follow up with Renee Barrios, PA via VIRTUAL VISIT on 07/19/19 at 9:30 AM  Any Other Special Instructions Will Be Listed Below (If Applicable).

## 2019-06-30 ENCOUNTER — Other Ambulatory Visit: Payer: Self-pay

## 2019-06-30 DIAGNOSIS — E875 Hyperkalemia: Secondary | ICD-10-CM

## 2019-07-04 ENCOUNTER — Other Ambulatory Visit: Payer: Self-pay | Admitting: Interventional Cardiology

## 2019-07-06 DIAGNOSIS — E1129 Type 2 diabetes mellitus with other diabetic kidney complication: Secondary | ICD-10-CM | POA: Diagnosis not present

## 2019-07-06 DIAGNOSIS — H353220 Exudative age-related macular degeneration, left eye, stage unspecified: Secondary | ICD-10-CM | POA: Diagnosis not present

## 2019-07-06 DIAGNOSIS — Z0000 Encounter for general adult medical examination without abnormal findings: Secondary | ICD-10-CM | POA: Diagnosis not present

## 2019-07-06 DIAGNOSIS — I130 Hypertensive heart and chronic kidney disease with heart failure and stage 1 through stage 4 chronic kidney disease, or unspecified chronic kidney disease: Secondary | ICD-10-CM | POA: Diagnosis not present

## 2019-07-06 DIAGNOSIS — I509 Heart failure, unspecified: Secondary | ICD-10-CM | POA: Diagnosis not present

## 2019-07-06 DIAGNOSIS — E1139 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: Secondary | ICD-10-CM | POA: Diagnosis not present

## 2019-07-06 DIAGNOSIS — I2510 Atherosclerotic heart disease of native coronary artery without angina pectoris: Secondary | ICD-10-CM | POA: Diagnosis not present

## 2019-07-06 DIAGNOSIS — Z952 Presence of prosthetic heart valve: Secondary | ICD-10-CM | POA: Diagnosis not present

## 2019-07-06 DIAGNOSIS — E113292 Type 2 diabetes mellitus with mild nonproliferative diabetic retinopathy without macular edema, left eye: Secondary | ICD-10-CM | POA: Diagnosis not present

## 2019-07-06 DIAGNOSIS — Z1389 Encounter for screening for other disorder: Secondary | ICD-10-CM | POA: Diagnosis not present

## 2019-07-06 DIAGNOSIS — I663 Occlusion and stenosis of cerebellar arteries: Secondary | ICD-10-CM | POA: Diagnosis not present

## 2019-07-06 DIAGNOSIS — E7800 Pure hypercholesterolemia, unspecified: Secondary | ICD-10-CM | POA: Diagnosis not present

## 2019-07-06 NOTE — Progress Notes (Signed)
Virtual Visit via Telephone Note   This visit type was conducted due to national recommendations for restrictions regarding the COVID-19 Pandemic (e.g. social distancing) in an effort to limit this patient's exposure and mitigate transmission in our community.  Due to her co-morbid illnesses, this patient is at least at moderate risk for complications without adequate follow up.  This format is felt to be most appropriate for this patient at this time.  The patient did not have access to video technology/had technical difficulties with video requiring transitioning to audio format only (telephone).  All issues noted in this document were discussed and addressed.  No physical exam could be performed with this format.  Please refer to the patient's chart for her  consent to telehealth for [facility].   Date:  07/19/2019   ID:  [name], DOB December 26, 1932, MRN [id number]  Patient Location: Home Provider Location: Home  PCP:  Gaynelle Arabian, MD  Cardiologist:  Larae Grooms, MD   Electrophysiologist:  None   Evaluation Performed:  Follow-Up Visit  Chief Complaint: f/u  History of Present Illness:    [name] is a 85 y.o. female with a hx of CAD and aortic valve disease status post CABG and mechanical AVR cardiac cath 03/2015, subsequent PCI to the RCA with BMS,LIMA-LAD was patent, SVG-diagonal patent, SVG-OM/PDA occluded. The second half of the OM/PDA graft was open. RCA stents were patent. She had an ostial RCA lesion of 75% which was treated with a Synergy DES. Because she needs Coumadin for her mechanical AVR, she was continued on Coumadin plus Plavix only (no aspirin).   HTN, HL, diabetes, prior GI bleed, CKD, diastolic CHF, PAF.    Patient had sepsis due to gram-negative bacteremia E. coli related to UTI 11/2018.  Echo with normal LV function moderate LVH normally functioning mechanical aortic valve possible mitral valve with small mobile vegetation in the anterior leaflet  and small ASD suggested by Doppler TEE was negative for vegetations.  Last office visit with Dr. Irish Lack 06/29/2019 felt her sudden dyspnea on exertion was due to recent onset atrial fibrillation.  He increase metoprolol to 25 mg twice daily.  If symptoms do not improve he recommended amiodarone and cardioversion in a few weeks.  He is concerned about her Coumadin if we start amiodarone close monitoring of her INRs.  As she is sensitive to changes in her Coumadin and has had GI bleed in the past.   Patient complain of dyspnea on exertion with little activity.  She cannot tell if she is in atrial fibrillation or if her pulse is irregular.  Her blood pressure is high this morning but pulse is 70.  She does not take her blood pressure regularly.  She did not increase her metoprolol to 25 mg twice daily.  She says she is very forgetful and needs her husband to come to the office with her.  She has a Coumadin clinic visit today at 11:30.  Will have them check an EKG while she is there.  No other complaints.  The patient does not have symptoms concerning for COVID-19 infection (fever, chills, cough, or new shortness of breath).    Past Medical History:  Diagnosis Date  . Anemia   . Anginal pain ([place])   . Arteriovenous malformation of gastrointestinal tract   . Arthritis    "fingers mostly" (04/12/2015)  . Carotid artery disease ([place])    Carotid US 3/17:  76-54% RICA; 65-03% LICA; Elevated bilateral subclavian artery velocities >>  f/u 1 year. // Carotid US 4/18: R 40-59; L 1-39 >> FU 1 year // Carotid US 10/2018: R 40-59; L 1-39, L subclavian stenosis   . CHF (congestive heart failure) ([place])   . Chronic kidney disease (CKD), stage III (moderate)   . Chronic lower GI bleeding    "today; last time was ~ 8 yr ago; used to have them often before that too" (02/11/2013)  . Coronary artery disease   . DJD (degenerative joint disease)   . Dysrhythmia   . Heart murmur   . History of blood transfusion    "a  few times over the years; usually related to my Coumadin" (04/12/2015  . History of gout   . Hyperlipemia   . Hypertension   . Macula lutea degeneration   . Old MI (myocardial infarction)    "a coulple /dr in 02/2008; I never even knew I'd had them" (04/12/2015)  . Type II diabetes mellitus ([place])    Past Surgical History:  Procedure Laterality Date  . AORTIC VALVE REPLACEMENT  1993  . APPENDECTOMY  1953  . CARDIAC CATHETERIZATION  ~1990  . CARDIAC CATHETERIZATION  04/12/2015   Procedure: Coronary Stent Intervention;  Surgeon: Jettie Booze, MD;    SYNERGY DES 3.5X16 to the ostial RCA   . CARDIAC CATHETERIZATION  04/12/2015   Procedure: Coronary/Graft Angiography;  Surgeon: Eloy End, MD; LAD & CFX 100%, patent LIMA-LAD, SVG-D1; SVG-OM-PDA first limb 100%, 2nd limb patent; oRCA 75%>0 w/ stent  . [place]   St. Jude/notes 10/29/2003 (02/11/2013)  . COLONOSCOPY N/A 02/13/2013   Procedure: COLONOSCOPY;  Surgeon: Lafayette Dragon, MD;  Location: [facility] ENDOSCOPY;  Service: Endoscopy;  Laterality: N/A;  . CORONARY ANGIOPLASTY WITH STENT PLACEMENT  2009+   "3 at least; put in 1 stent at a time" (02/11/2013)  . CORONARY ARTERY BYPASS GRAFT  1990   LIMA-LAD, SVG-OM-PDA, SVG-D1  . DILATION AND CURETTAGE OF UTERUS  1960's   'after a miscarriage" (02/11/2013)  . ENTEROSCOPY N/A 02/13/2013   Procedure: ENTEROSCOPY;  Surgeon: Lafayette Dragon, MD;  Location: [facility] ENDOSCOPY;  Service: Endoscopy;  Laterality: N/A;  . EXPLORATORY LAPAROTOMY  02/24/2008   which revealed a retroperitoneal hematoma and bleeding from the right external iliac artery/notes 03/02/2008 (02/11/2013)   . FLEXIBLE SIGMOIDOSCOPY N/A 04/21/2015   Procedure: FLEXIBLE SIGMOIDOSCOPY;  Surgeon: Ladene Artist, MD;  Location: [facility] ENDOSCOPY;  Service: Endoscopy;  Laterality: N/A;  . TEE WITHOUT CARDIOVERSION N/A 12/01/2018   Procedure: TRANSESOPHAGEAL ECHOCARDIOGRAM (TEE);  Surgeon: Jerline Pain, MD;  Location: [facility]  ENDOSCOPY;  Service: Cardiovascular;  Laterality: N/A;     Current Meds  Medication Sig  . aspirin EC 81 MG tablet Take 1 tablet (81 mg total) by mouth daily.  Marland Kitchen atorvastatin (LIPITOR) 80 MG tablet Take 1 tablet by mouth once daily  . bevacizumab (AVASTIN) 1.25 mg/0.1 mL SOLN Place 1.25 mg into the left eye every 8 (eight) weeks.  . digoxin (LANOXIN) 0.125 MG tablet Take 0.125 mg by mouth daily.  Marland Kitchen ezetimibe (ZETIA) 10 MG tablet TAKE 1 TABLET BY MOUTH ONCE DAILY PLEASE  MAKE  OVERDUE  APPOINTMENT  WITH  DR  Irish Lack  BEFORE  ANYMORE  REFILLS  . FeFum-FePo-FA-B Cmp-C-Zn-Mn-Cu (SE-TAN PLUS) 162-115.2-1 MG CAPS Take 1 tablet by mouth 2 (two) times daily.  . folic acid (FOLVITE) 277 MCG tablet Take 400 mcg by mouth every evening.   . furosemide (LASIX) 40 MG tablet Take 40 mg by mouth  daily.  . isosorbide mononitrate (IMDUR) 60 MG 24 hr tablet Take 1 tablet (60 mg total) by mouth daily.  Marland Kitchen JANUVIA 50 MG tablet TAKE 1 TABLET BY MOUTH ONCE DAILY FOR DIABETES  . losartan (COZAAR) 50 MG tablet Take 50 mg by mouth daily.  . metoprolol tartrate (LOPRESSOR) 25 MG tablet Take 1 tablet (25 mg total) by mouth 2 (two) times daily.  . Multiple Vitamin (MULTIVITAMIN WITH MINERALS) TABS Take 1 tablet by mouth daily.  . Multiple Vitamins-Minerals (PRESERVISION AREDS 2 PO) Take 1 tablet by mouth 2 (two) times daily.   . nitroGLYCERIN (NITROSTAT) 0.4 MG SL tablet Place 1 tablet (0.4 mg total) under the tongue every 5 (five) minutes as needed for chest pain. Please make appt for January. 1st attempt  . Omega-3 Fatty Acids 1200 MG CAPS Take 1 capsule by mouth 2 (two) times daily.   . potassium chloride (K-DUR,KLOR-CON) 10 MEQ tablet Take 20 mEq by mouth daily.  Marland Kitchen warfarin (JANTOVEN) 5 MG tablet Take 5 mg by mouth daily.     Allergies:   Darvon [propoxyphene], Lisinopril, Septra [sulfamethoxazole-trimethoprim], Warfarin and related, and Penicillins   Social History   Tobacco Use  . Smoking status: Never Smoker   . Smokeless tobacco: Never Used  Substance Use Topics  . Alcohol use: No  . Drug use: No     Family Hx: The patient's family history includes Heart disease in her father and mother.  ROS:   Please see the history of present illness.      All other systems reviewed and are negative.   Prior CV studies:   The following studies were reviewed today: TEE 2018-12-21  IMPRESSIONS      1. The left ventricle has hyperdynamic systolic function, with an ejection fraction of >65%. The cavity size was normal.  2. The right ventricle has normal systolc function. The cavity was normal. There is no increase in right ventricular wall thickness.  3. Mild mitral valve prolapse.  4. Mitral valve regurgitation is mild to moderate by color flow Doppler.  5. Tricuspid valve regurgitation is moderate.  6. Aortic valve regurgitation mild perivalvular [name].  7. Pulmonic valve regurgitation was not assessed by color flow Doppler.  8. There is evidence of plaque in the descending aorta.  9. No vegetations, no endocarditis. 10. - TAVR: TAVR valve with mild perivalvular [name]. No vegetations.   FINDINGS  Left Ventricle: The left ventricle has hyperdynamic systolic function, with an ejection fraction of >65%. The cavity size was normal. There is no increase in left ventricular wall thickness. Right Ventricle: The right ventricle has normal systolic function. The cavity was normal. There is no increase in right ventricular wall thickness. Left Atrium: Left atrial size was normal in size. Right Atrium: Right atrial size was normal in size. Interatrial Septum: No atrial level shunt detected by color flow Doppler. Pericardium: There is no evidence of pericardial effusion. Mitral Valve: Mitral valve regurgitation is mild to moderate by color flow Doppler. There is mild holosystolic prolapse of of the anterior mitral leaflet of the mitral valve. Tricuspid Valve: Tricuspid valve regurgitation is moderate by color  flow Doppler. Aortic Valve: Aortic valve regurgitation mild perivalvular [name]. - TAVR: TAVR valve with mild perivalvular [name]. No vegetations. Pulmonic Valve: Pulmonic valve regurgitation was not assessed by color flow Doppler. Aorta: There is evidence of plaque in the descending aorta. Venous: The inferior vena cava is normal in size with greater than 50% respiratory variability.   LV Wall Scoring:  TRICUSPID VALVE TR Peak grad:   24.4 mmHg TR Vmax:        247.00 cm/s     Renee Furbish MD Electronically signed by Renee Furbish MD Signature Date/Time: 12/01/2018/5:26:21 PM   Echo 11/30/2018  IMPRESSIONS      1. The left ventricle has normal systolic function with an ejection fraction of 60-65%. The cavity size was normal. There is moderately increased left ventricular wall thickness. Left ventricular diastolic function could not be evaluated secondary to  atrial fibrillation. No evidence of left ventricular regional wall motion abnormalities.  2. Left atrial size was mildly dilated.  3. The mitral valve is degenerative. Moderate thickening of the mitral valve leaflet. Moderate calcification of the mitral valve leaflet. There is moderate mitral annular calcification present. A cannot exclude vegetation vegetation is seen on the  mitral leaflet.  4. Aortic valve regurgitation is trivial by color flow Doppler. mild stenosis of the aortic valve.  5. The ascending aorta and aortic root are normal in size and structure.  6. When compared to the prior study: 7/9/201: LVEF 60-65%, normal mechanical AOV prosthesis, mean gradient 28 mmHg, MAC with mild MR.  7. Cannot exclude small ASD.   SUMMARY   LVEF 60-65%, moderate LVH, normal wall motion, mechanical aortic valve with trivial AI, heavy MAC with mild MR - cannot exclude possible small mobile vegetation on the anterior mitral leaflet, a small ASD is suggested by color doppler. Recommend further evalution with TEE/bubble study, especially for  better evaluation of the valves with regards to endocarditis.  FINDINGS  Left Ventricle: The left ventricle has normal systolic function, with an ejection fraction of 60-65%. The cavity size was normal. There is moderately increased left ventricular wall thickness. Left ventricular diastolic function could not be evaluated  secondary to atrial fibrillation. No evidence of left ventricular regional wall motion abnormalities.. Right Ventricle: The right ventricle has low normal systolic function. The cavity was normal. There is no increase in right ventricular wall thickness. Left Atrium: left atrial size was mildly dilated Right Atrium: right atrial size was normal in size. Right atrial pressure is estimated at 3 mmHg. Interatrial Septum: Cannot exclude small ASD. Pericardium: There is no evidence of pericardial effusion. Mitral Valve: The mitral valve is degenerative in appearance. Moderate thickening of the mitral valve leaflet. Moderate calcification of the mitral valve leaflet. There is moderate mitral annular calcification present. Mitral valve regurgitation is mild  by color flow Doppler. A cannot exclude vegetation vegetation is seen on the mitral leaflet. Tricuspid Valve: The tricuspid valve is normal in structure. Tricuspid valve regurgitation is mild by color flow Doppler. Aortic Valve: The aortic valve has been repaired/replaced Aortic valve regurgitation is trivial by color flow Doppler. There is mild stenosis of the aortic valve, with a calculated valve area of 0.66 cm. Pulmonic Valve: The pulmonic valve was not well visualized. Pulmonic valve regurgitation is not visualized by color flow Doppler. Aorta: The ascending aorta and aortic root are normal in size and structure. Venous: The inferior vena cava measures 1.79 cm, is normal in size with greater than 50% respiratory variability. Compared to previous exam: 7/9/201: LVEF 60-65%, normal mechanical AOV prosthesis, mean gradient 28  mmHg, MAC with mild MR.      Labs/Other Tests and Data Reviewed:    EKG:  An ECG dated 07/19/19 was personally reviewed today and demonstrated:  Afib at 61/m, poor R wave ant and nonspecific Intraventricular conduction delay.  Recent Labs: 10/01/2018: B Natriuretic Peptide 150.0 12/02/2018:  Hemoglobin 9.9; Platelets 212 06/29/2019: ALT 24; BUN 28; Creatinine, Ser 1.36; Potassium 5.4; Sodium 137; TSH 4.260   Recent Lipid Panel Lab Results  Component Value Date/Time   CHOL 117 06/29/2019 09:53 AM   CHOL 172 05/01/2015 12:34 PM   TRIG 153 (H) 06/29/2019 09:53 AM   TRIG 248 (H) 05/01/2015 12:34 PM   HDL 51 06/29/2019 09:53 AM   HDL 46 05/01/2015 12:34 PM   CHOLHDL 2.3 06/29/2019 09:53 AM   LDLCALC 40 06/29/2019 09:53 AM   LDLCALC 76 05/01/2015 12:34 PM    Wt Readings from Last 3 Encounters:  07/19/19 143 lb (64.9 kg)  06/29/19 137 lb 6.4 oz (62.3 kg)  12/02/18 131 lb 6.4 oz (59.6 kg)     Objective:    Vital Signs:  BP (!) 187/76   Pulse 71   Wt 143 lb (64.9 kg)   BMI 27.93 kg/m    VITAL SIGNS:  reviewed  ASSESSMENT & PLAN:    CABG and mechanical AVR on Coumadin cardiac cath 03/2015, subsequent PCI to the RCA with BMS  Atrial fibrillation with dyspnea on exertion found 06/29/2019.  Patient started on metoprolol 25 mg twice daily but had never increased it to 25 mg twice daily only taken it once a day.  On Coumadin.  Dr. Irish Lack recommended possible amiodarone and cardioversion if she continues to have A. Fib-she is sensitive to changes in her Coumadin and has a history of GI bleed.  EKG checked today while in the Coumadin clinic and reveals atrial fibrillation at 61 bpm.  Her husband states that she has been taking metoprolol twice a day.  Will reduce it to once a day and begin amiodarone 400 mg twice daily for 8 days then 200 mg daily.  Follow-up in 3 to 4 weeks in office for possible cardioversion. Essential hypertension blood pressure running quite high this morning but has  not taken her medications yet.  May need to increase losartan.  For now increase metoprolol to 25 mg twice daily.  I have asked her to check her blood pressure regularly.  Hyperlipidemia LDL 54 04/2018 needs repeat  Chronic diastolic CHF no edema seems to be compensated.  BNP not done with last labs.  COVID-19 Education: The signs and symptoms of COVID-19 were discussed with the patient and how to seek care for testing (follow up with PCP or arrange E-visit).   The importance of social distancing was discussed today.  Time:   Today, I have spent 7:42 minutes with the patient with telehealth technology discussing the above problems.     Medication Adjustments/Labs and Tests Ordered: Current medicines are reviewed at length with the patient today.  Concerns regarding medicines are outlined above.   Tests Ordered: Orders Placed This Encounter  Procedures  . EKG 12-Lead    Medication Changes: No orders of the defined types were placed in this encounter.   Follow Up:  In Person in 4 week(s) Dr. Irish Lack or Ermalinda Barrios [facility]  Signed, Ermalinda Barrios, PA-C  07/19/2019 12:38 PM    [place]

## 2019-07-07 ENCOUNTER — Other Ambulatory Visit: Payer: Self-pay | Admitting: Interventional Cardiology

## 2019-07-08 DIAGNOSIS — E1129 Type 2 diabetes mellitus with other diabetic kidney complication: Secondary | ICD-10-CM | POA: Diagnosis not present

## 2019-07-18 ENCOUNTER — Telehealth: Payer: Self-pay

## 2019-07-18 NOTE — Telephone Encounter (Signed)
Spoke with pt and confirmed appt, medications, and pharmacy. Pt would like to have a phone call for visit.    YOUR CARDIOLOGY TEAM HAS ARRANGED FOR AN E-VISIT FOR YOUR APPOINTMENT - PLEASE REVIEW IMPORTANT INFORMATION BELOW SEVERAL DAYS PRIOR TO YOUR APPOINTMENT  Due to the recent COVID-19 pandemic, we are transitioning in-person office visits to tele-medicine visits in an effort to decrease unnecessary exposure to our patients, their families, and staff. These visits are billed to your insurance just like a normal visit is. We also encourage you to sign up for MyChart if you have not already done so. You will need a smartphone if possible. For patients that do not have this, we can still complete the visit using a regular telephone but do prefer a smartphone to enable video when possible. You may have a family member that lives with you that can help. If possible, we also ask that you have a blood pressure cuff and scale at home to measure your blood pressure, heart rate and weight prior to your scheduled appointment. Patients with clinical needs that need an in-person evaluation and testing will still be able to come to the office if absolutely necessary. If you have any questions, feel free to call our office.     YOUR PROVIDER WILL BE USING THE FOLLOWING PLATFORM TO COMPLETE YOUR VISIT: Phone Call . IF USING MYCHART - How to Download the MyChart App to Your SmartPhone   - If Apple, go to [employer] and type in MyChart in the search bar and download the app. If Android, ask patient to go to [employer] and type in [place] in the search bar and download the app. The app is free but as with any other app downloads, your phone may require you to verify saved payment information or Apple/Android password.  - You will need to then log into the app with your MyChart username and password, and select [facility] as your healthcare provider to link the account.  - When it is time for your visit, go to  the MyChart app, find appointments, and click Begin Video Visit. Be sure to Select Allow for your device to access the Microphone and Camera for your visit. You will then be connected, and your provider will be with you shortly.  **If you have any issues connecting or need assistance, please contact MyChart service desk [phone])**  **If using a computer, in order to ensure the best quality for your visit, you will need to use either of the following Internet Browsers: Insurance underwriter or [employer]**  . IF USING DOXIMITY or DOXY.ME - The staff will give you instructions on receiving your link to join the meeting the day of your visit.      2-3 DAYS BEFORE YOUR APPOINTMENT  You will receive a telephone call from one of our [facility] team members - your caller ID may say "Unknown caller." If this is a video visit, we will walk you through how to get the video launched on your phone. We will remind you check your blood pressure, heart rate and weight prior to your scheduled appointment. If you have an Apple Watch or Kardia, please upload any pertinent ECG strips the day before or morning of your appointment to [place]. Our staff will also make sure you have reviewed the consent and agree to move forward with your scheduled tele-health visit.     THE DAY OF YOUR APPOINTMENT  Approximately 15 minutes prior to your  scheduled appointment, you will receive a telephone call from one of [facility] team - your caller ID may say "Unknown caller."  Our staff will confirm medications, vital signs for the day and any symptoms you may be experiencing. Please have this information available prior to the time of visit start. It may also be helpful for you to have a pad of paper and pen handy for any instructions given during your visit. They will also walk you through joining the smartphone meeting if this is a video visit.    CONSENT FOR TELE-HEALTH VISIT - PLEASE REVIEW  I hereby  voluntarily request, consent and authorize [facility] and its employed or contracted physicians, physician assistants, nurse practitioners or other licensed health care professionals (the Practitioner), to provide me with telemedicine health care services (the "Services") as deemed necessary by the treating Practitioner. I acknowledge and consent to receive the Services by the Practitioner via telemedicine. I understand that the telemedicine visit will involve communicating with the Practitioner through live audiovisual communication technology and the disclosure of certain medical information by electronic transmission. I acknowledge that I have been given the opportunity to request an in-person assessment or other available alternative prior to the telemedicine visit and am voluntarily participating in the telemedicine visit.  I understand that I have the right to withhold or withdraw my consent to the use of telemedicine in the course of my care at any time, without affecting my right to future care or treatment, and that the Practitioner or I may terminate the telemedicine visit at any time. I understand that I have the right to inspect all information obtained and/or recorded in the course of the telemedicine visit and may receive copies of available information for a reasonable fee.  I understand that some of the potential risks of receiving the Services via telemedicine include:  Marland Kitchen Delay or interruption in medical evaluation due to technological equipment failure or disruption; . Information transmitted may not be sufficient (e.g. poor resolution of images) to allow for appropriate medical decision making by the Practitioner; and/or  . In rare instances, security protocols could fail, causing a breach of personal health information.  Furthermore, I acknowledge that it is my responsibility to provide information about my medical history, conditions and care that is complete and accurate to the best  of my ability. I acknowledge that Practitioner's advice, recommendations, and/or decision may be based on factors not within their control, such as incomplete or inaccurate data provided by me or distortions of diagnostic images or specimens that may result from electronic transmissions. I understand that the practice of medicine is not an exact science and that Practitioner makes no warranties or guarantees regarding treatment outcomes. I acknowledge that I will receive a copy of this consent concurrently upon execution via email to the email address I last provided but may also request a printed copy by calling the office of [facility].    I understand that my insurance will be billed for this visit.   I have read or had this consent read to me. . I understand the contents of this consent, which adequately explains the benefits and risks of the Services being provided via telemedicine.  . I have been provided ample opportunity to ask questions regarding this consent and the Services and have had my questions answered to my satisfaction. . I give my informed consent for the services to be provided through the use of telemedicine in my medical care  By participating in this  telemedicine visit I agree to the above.

## 2019-07-19 ENCOUNTER — Encounter: Payer: Self-pay | Admitting: Physician Assistant

## 2019-07-19 ENCOUNTER — Telehealth (INDEPENDENT_AMBULATORY_CARE_PROVIDER_SITE_OTHER): Payer: Medicare Other | Admitting: Physician Assistant

## 2019-07-19 ENCOUNTER — Ambulatory Visit (INDEPENDENT_AMBULATORY_CARE_PROVIDER_SITE_OTHER): Payer: Medicare Other | Admitting: *Deleted

## 2019-07-19 ENCOUNTER — Other Ambulatory Visit: Payer: Medicare Other

## 2019-07-19 ENCOUNTER — Other Ambulatory Visit: Payer: Self-pay

## 2019-07-19 VITALS — BP 187/76 | HR 71 | Wt 143.0 lb

## 2019-07-19 DIAGNOSIS — I6523 Occlusion and stenosis of bilateral carotid arteries: Secondary | ICD-10-CM

## 2019-07-19 DIAGNOSIS — I480 Paroxysmal atrial fibrillation: Secondary | ICD-10-CM

## 2019-07-19 DIAGNOSIS — E875 Hyperkalemia: Secondary | ICD-10-CM | POA: Diagnosis not present

## 2019-07-19 DIAGNOSIS — I5032 Chronic diastolic (congestive) heart failure: Secondary | ICD-10-CM | POA: Diagnosis not present

## 2019-07-19 DIAGNOSIS — Z8719 Personal history of other diseases of the digestive system: Secondary | ICD-10-CM

## 2019-07-19 DIAGNOSIS — Z5181 Encounter for therapeutic drug level monitoring: Secondary | ICD-10-CM

## 2019-07-19 DIAGNOSIS — I2510 Atherosclerotic heart disease of native coronary artery without angina pectoris: Secondary | ICD-10-CM | POA: Diagnosis not present

## 2019-07-19 DIAGNOSIS — I359 Nonrheumatic aortic valve disorder, unspecified: Secondary | ICD-10-CM | POA: Diagnosis not present

## 2019-07-19 DIAGNOSIS — I10 Essential (primary) hypertension: Secondary | ICD-10-CM | POA: Diagnosis not present

## 2019-07-19 DIAGNOSIS — E782 Mixed hyperlipidemia: Secondary | ICD-10-CM

## 2019-07-19 DIAGNOSIS — Z952 Presence of prosthetic heart valve: Secondary | ICD-10-CM

## 2019-07-19 LAB — POCT INR: INR: 2.2 (ref 2.0–3.0)

## 2019-07-19 MED ORDER — AMIODARONE HCL 200 MG PO TABS: ORAL_TABLET | ORAL | 0 refills | Status: DC

## 2019-07-19 MED ORDER — METOPROLOL TARTRATE 25 MG PO TABS: 25.0000 mg | ORAL_TABLET | Freq: Every day | ORAL | 3 refills | Status: DC

## 2019-07-19 NOTE — Patient Instructions (Signed)
Description   Continue on same dosage 1 tablet every day except 1/2 tablet on Sundays and Wednesdays.  Recheck INR in 3 weeks. Call our office if you have any problems or concerns [phone].       

## 2019-07-19 NOTE — Patient Instructions (Addendum)
Medication Instructions:  Your physician has recommended you make the following change in your medication:   1. STOP: Digoxin  2. DECREASE: metoprolol tartrate (lopressor) 25 mg tablet: Take 1 tablet by mouth ONCE a day  3. START: amiodarone 200 mg tablet: Take 2 tablets (400 mg) by mouth TWICE a day for 8 days, then take 1 tablet (200 mg) by mouth ONCE a day  Lab work: None Ordered  If you have labs (blood work) drawn today and your tests are completely normal, you will receive your results only by: Marland Kitchen MyChart Message (if you have MyChart) OR . A paper copy in the mail If you have any lab test that is abnormal or we need to change your treatment, we will call you to review the results.  Testing/Procedures: None ordered  Follow-Up: . Follow up in the office with Ermalinda Barrios, PA on 08/09/19 at 12:00 PM (after your coumadin appointment)  Any Other Special Instructions Will Be Listed Below (If Applicable).

## 2019-07-20 ENCOUNTER — Telehealth: Payer: Self-pay | Admitting: Physician Assistant

## 2019-07-20 ENCOUNTER — Other Ambulatory Visit: Payer: Self-pay

## 2019-07-20 LAB — BASIC METABOLIC PANEL
BUN/Creatinine Ratio: 26 (ref 12–28)
BUN: 37 mg/dL — ABNORMAL HIGH (ref 8–27)
CO2: 21 mmol/L (ref 20–29)
Calcium: 9.7 mg/dL (ref 8.7–10.3)
Chloride: 107 mmol/L — ABNORMAL HIGH (ref 96–106)
Creatinine, Ser: 1.41 mg/dL — ABNORMAL HIGH (ref 0.57–1.00)
GFR calc Af Amer: 40 mL/min/{1.73_m2} — ABNORMAL LOW (ref >59)
GFR calc non Af Amer: 34 mL/min/{1.73_m2} — ABNORMAL LOW (ref >59)
Glucose: 156 mg/dL — ABNORMAL HIGH (ref 65–99)
Potassium: 5.7 mmol/L — ABNORMAL HIGH (ref 3.5–5.2)
Sodium: 140 mmol/L (ref 134–144)

## 2019-07-20 NOTE — Telephone Encounter (Signed)
Pt's medication has already been sent to pt's pharmacy as requested. Confirmation received.  

## 2019-07-20 NOTE — Telephone Encounter (Signed)
Pt c/o medication issue:  1. Name of Medication: amiodarone (PACERONE) 200 MG tablet  2. How are you currently taking this medication (dosage and times per day)? Not taking yet   3. Are you having a reaction (difficulty breathing--STAT)? n/a  4. What is your medication issue? Patient states her pharmacy has not reiceved the new rx yet.  The patient uses [place], [place]

## 2019-07-26 ENCOUNTER — Other Ambulatory Visit: Payer: Self-pay

## 2019-07-26 ENCOUNTER — Ambulatory Visit (INDEPENDENT_AMBULATORY_CARE_PROVIDER_SITE_OTHER): Payer: Medicare Other | Admitting: *Deleted

## 2019-07-26 DIAGNOSIS — Z5181 Encounter for therapeutic drug level monitoring: Secondary | ICD-10-CM | POA: Diagnosis not present

## 2019-07-26 DIAGNOSIS — I359 Nonrheumatic aortic valve disorder, unspecified: Secondary | ICD-10-CM

## 2019-07-26 DIAGNOSIS — Z952 Presence of prosthetic heart valve: Secondary | ICD-10-CM | POA: Diagnosis not present

## 2019-07-26 LAB — POCT INR: INR: 4.6 — AB (ref 2.0–3.0)

## 2019-07-26 NOTE — Patient Instructions (Signed)
Description   Do not take any Warfarin today and No Warfarin tomorrow then start taking 1 tablet every day except 1/2 tablet on Sundays, Wednesdays, and Fridays. Recheck INR in 1 week. Call our office if you have any problems or concerns [phone].

## 2019-07-27 DIAGNOSIS — H35423 Microcystoid degeneration of retina, bilateral: Secondary | ICD-10-CM | POA: Diagnosis not present

## 2019-07-27 DIAGNOSIS — E113293 Type 2 diabetes mellitus with mild nonproliferative diabetic retinopathy without macular edema, bilateral: Secondary | ICD-10-CM | POA: Diagnosis not present

## 2019-07-27 DIAGNOSIS — H353112 Nonexudative age-related macular degeneration, right eye, intermediate dry stage: Secondary | ICD-10-CM | POA: Diagnosis not present

## 2019-07-27 DIAGNOSIS — H353221 Exudative age-related macular degeneration, left eye, with active choroidal neovascularization: Secondary | ICD-10-CM | POA: Diagnosis not present

## 2019-07-28 ENCOUNTER — Telehealth: Payer: Self-pay | Admitting: *Deleted

## 2019-07-28 ENCOUNTER — Telehealth: Payer: Self-pay

## 2019-07-28 NOTE — Telephone Encounter (Signed)
Pt's husband called into clinic stating pt's INR was 4.6 yesterday, and she now has a nose bleeding that they cannot control or get to stop bleeding.  Advised pt's husband pt needs to go to the ED to have nosebleed assessed.  It may need to either be packed or a surface vessel may need to be cauterized to get bleeding stopped.  Pt's husband states there is a PCP office/health clinic closer to them, the hospital is 16 miles away, he wants to try their office first before going to the hospital.  I advised him that they may not be able to provide the emergent care pt is needing, but if he wanted to try and see if they could handle he could as long as the bleeding was not so heavy that see needed emergent medical care and the delay in such care puts her at an increased risk.  Pt's husband verbalized understanding.

## 2019-07-28 NOTE — Telephone Encounter (Addendum)
Pt called and stated that she had a nose bleed today that has now stopped. Pt's husband called about an hour prior and was instructed to seek medical attention to stop the bleeding. Pt stated her nose is no longer bleeding. However, Pt concerned about taking her coumadin. Instructed pt to take 1/2 a tablet of coumadin tonight and that she could come in tomorrow and get her INR checked. Appointment made for tomorrow 11/6 at 11:45.

## 2019-07-29 ENCOUNTER — Other Ambulatory Visit: Payer: Self-pay

## 2019-07-29 ENCOUNTER — Ambulatory Visit (INDEPENDENT_AMBULATORY_CARE_PROVIDER_SITE_OTHER): Payer: Medicare Other | Admitting: *Deleted

## 2019-07-29 DIAGNOSIS — I359 Nonrheumatic aortic valve disorder, unspecified: Secondary | ICD-10-CM

## 2019-07-29 DIAGNOSIS — Z952 Presence of prosthetic heart valve: Secondary | ICD-10-CM | POA: Diagnosis not present

## 2019-07-29 DIAGNOSIS — Z5181 Encounter for therapeutic drug level monitoring: Secondary | ICD-10-CM | POA: Diagnosis not present

## 2019-07-29 LAB — POCT INR: INR: 2.4 (ref 2.0–3.0)

## 2019-07-29 NOTE — Patient Instructions (Addendum)
Description   Continue taking 1 tablet every day except 1/2 tablet on Sundays, Wednesdays, and Fridays. Recheck INR in 1 week pending Cardioversion. Call our office if you have any problems or concerns [phone].

## 2019-08-02 ENCOUNTER — Other Ambulatory Visit: Payer: Self-pay

## 2019-08-02 ENCOUNTER — Ambulatory Visit (INDEPENDENT_AMBULATORY_CARE_PROVIDER_SITE_OTHER): Payer: Medicare Other

## 2019-08-02 DIAGNOSIS — I359 Nonrheumatic aortic valve disorder, unspecified: Secondary | ICD-10-CM

## 2019-08-02 DIAGNOSIS — Z5181 Encounter for therapeutic drug level monitoring: Secondary | ICD-10-CM

## 2019-08-02 LAB — POCT INR: INR: 2.3 (ref 2.0–3.0)

## 2019-08-02 NOTE — Patient Instructions (Signed)
Description   Continue on same dosage 1 tablet every day except 1/2 tablet on Sundays, Wednesdays, and Fridays. Recheck INR in 1 week pending Cardioversion. Call our office if you have any problems or concerns [phone].

## 2019-08-08 NOTE — H&P (View-Only) (Signed)
Cardiology Office Note    Date:  08/09/2019   ID:  Renee, [name] [birth year], MRN [id number]  PCP:  Renee Arabian, MD  Cardiologist: Renee Grooms, MD EPS: None  Chief Complaint  Patient presents with  . Follow-up    History of Present Illness:  [name] is a 85 y.o. female with a hx of CAD and aortic valve disease status post CABG and mechanical AVR cardiac cath 03/2015, subsequent PCI to the RCA with BMS,LIMA-LAD was patent, SVG-diagonal patent, SVG-OM/PDA occluded. The second half of the OM/PDA graft was open. RCA stents were patent. She had an ostial RCA lesion of 75% which was treated with a Synergy DES. Because she needs Coumadin for her mechanical AVR, she was continued on Coumadin plus Plavix only (no aspirin). Also has HTN, HL, diabetes, prior GI bleed, CKD, diastolic CHF, PAF.    Patient had sepsis due to gram-negative bacteremia E. coli related to UTI 11/2018.  Echo with normal LV function moderate LVH normally functioning mechanical aortic valve possible mitral valve with small mobile vegetation in the anterior leaflet and small ASD suggested by Doppler TEE was negative for vegetations.   Last office visit with Dr. Irish [name] 06/29/2019 felt her sudden dyspnea on exertion was due to recent onset atrial fibrillation.  He increase metoprolol to 25 mg twice daily.  If symptoms do not improve he recommended amiodarone and cardioversion in a few weeks.  He is concerned about her Coumadin if we start amiodarone close monitoring of her INRs.  As she is sensitive to changes in her Coumadin and has had GI bleed in the past.    I had a telemedicine visit with the patient 07/19/2019 at which time she was complaining of dyspnea on exertion with little activity.  She did not increase her metoprolol to 25 mg twice daily because she is very forgetful although her husband claims she did..  EKG 07/19/2019 showed atrial fibrillation with controlled ventricular rate.  I started  amiodarone 400 mg twice daily for 8 days then 200 mg daily and reduce metoprolol to 25 mg once a day.  Patient still having dyspnea on exertion. Has a cold and has chest tightness when she has her coughing spells. Has had for a couple of years.Not like her angina.   Past Medical History:  Diagnosis Date  . Anemia   . Anginal [name] ([name])   . Arteriovenous malformation of gastrointestinal tract   . Arthritis    "fingers mostly" (04/12/2015)  . Carotid artery disease ([name])    Carotid US 3/17:  69-45% RICA; 03-88% LICA; Elevated bilateral subclavian artery velocities >>f/u 1 year. // Carotid US 4/18: R 40-59; L 1-39 >> FU 1 year // Carotid US 10/2018: R 40-59; L 1-39, L subclavian stenosis   . CHF (congestive heart failure) ([name])   . Chronic kidney disease (CKD), stage III (moderate)   . Chronic lower GI bleeding    "today; last time was ~ 8 yr ago; used to have them often before that too" (02/11/2013)  . Coronary artery disease   . DJD (degenerative joint disease)   . Dysrhythmia   . Heart murmur   . History of blood transfusion    "a few times over the years; usually related to my Coumadin" (04/12/2015  . History of gout   . Hyperlipemia   . Hypertension   . Macula lutea degeneration   . Old MI (myocardial infarction)    "a coulple /dr in 02/2008; I never  even knew I'd had them" (04/12/2015)  . Type II diabetes mellitus ([name])     Past Surgical History:  Procedure Laterality Date  . AORTIC VALVE REPLACEMENT  1993  . APPENDECTOMY  1953  . CARDIAC CATHETERIZATION  ~1990  . CARDIAC CATHETERIZATION  04/12/2015   Procedure: Coronary Stent Intervention;  Surgeon: Jettie Booze, MD;    SYNERGY DES 3.5X16 to the ostial RCA   . CARDIAC CATHETERIZATION  04/12/2015   Procedure: Coronary/Graft Angiography;  Surgeon: Eloy End, MD; LAD & CFX 100%, patent LIMA-LAD, SVG-D1; SVG-OM-PDA first limb 100%, 2nd limb patent; oRCA 75%>0 w/ stent  . [place]/notes 10/29/2003 (02/11/2013)  . COLONOSCOPY N/A 02/13/2013   Procedure: COLONOSCOPY;  Surgeon: Renee Dragon, MD;  Location: [facility] ENDOSCOPY;  Service: Endoscopy;  Laterality: N/A;  . CORONARY ANGIOPLASTY WITH STENT PLACEMENT  2009+   "3 at least; put in 1 stent at a time" (02/11/2013)  . CORONARY ARTERY BYPASS GRAFT  1990   LIMA-LAD, SVG-OM-PDA, SVG-D1  . DILATION AND CURETTAGE OF UTERUS  1960's   'after a miscarriage" (02/11/2013)  . ENTEROSCOPY N/A 02/13/2013   Procedure: ENTEROSCOPY;  Surgeon: Renee Dragon, MD;  Location: [facility] ENDOSCOPY;  Service: Endoscopy;  Laterality: N/A;  . EXPLORATORY LAPAROTOMY  02/24/2008   which revealed a retroperitoneal hematoma and bleeding from the right external iliac artery/notes 03/02/2008 (02/11/2013)   . FLEXIBLE SIGMOIDOSCOPY N/A 04/21/2015   Procedure: FLEXIBLE SIGMOIDOSCOPY;  Surgeon: Renee Artist, MD;  Location: [facility] ENDOSCOPY;  Service: Endoscopy;  Laterality: N/A;  . TEE WITHOUT CARDIOVERSION N/A 12/01/2018   Procedure: TRANSESOPHAGEAL ECHOCARDIOGRAM (TEE);  Surgeon: Renee Pain, MD;  Location[facility] ENDOSCOPY;  Service: Cardiovascular;  Laterality: N/A;    Current Medications: Current Meds  Medication Sig  . amiodarone (PACERONE) 200 MG tablet Take 2 tablets (400 mg) by mouth twice a day for 8 days, then take 1 tablet (200 mg) by mouth once a day  . aspirin EC 81 MG tablet Take 1 tablet (81 mg total) by mouth daily.  [name] atorvastatin (LIPITOR) 80 MG tablet Take 1 tablet by mouth once daily  . bevacizumab (AVASTIN) 1.25 mg/0.1 mL SOLN Place 1.25 mg into the left eye every 8 (eight) weeks.  [name] ezetimibe (ZETIA) 10 MG tablet TAKE 1 TABLET BY MOUTH ONCE DAILY PLEASE  MAKE  OVERDUE  APPOINTMENT  WITH  DR  [name]  BEFORE  ANYMORE  REFILLS  . FeFum-FePo-FA-B Cmp-C-Zn-Mn-Cu (SE-TAN PLUS) 162-115.2-1 MG CAPS Take 1 tablet by mouth 2 (two) times daily.  . folic acid (FOLVITE) 347 MCG tablet Take 400 mcg by mouth every evening.   . furosemide (LASIX) 40 MG tablet  Take 40 mg by mouth daily.  . isosorbide mononitrate (IMDUR) 60 MG 24 hr tablet Take 1 tablet (60 mg total) by mouth daily.  [name] JANUVIA 50 MG tablet TAKE 1 TABLET BY MOUTH ONCE DAILY FOR DIABETES  . losartan (COZAAR) 50 MG tablet Take 50 mg by mouth daily.  . metoprolol tartrate (LOPRESSOR) 25 MG tablet Take 1 tablet (25 mg total) by mouth daily.  . Multiple Vitamin (MULTIVITAMIN WITH MINERALS) TABS Take 1 tablet by mouth daily.  . Multiple Vitamins-Minerals (PRESERVISION AREDS 2 PO) Take 1 tablet by mouth 2 (two) times daily.   . nitroGLYCERIN (NITROSTAT) 0.4 MG SL tablet Place 1 tablet (0.4 mg total) under the tongue every 5 (five) minutes as needed for chest [name]. Please make appt for January. 1st attempt  .  Omega-3 Fatty Acids 1200 MG CAPS Take 1 capsule by mouth 2 (two) times daily.   [name] warfarin (JANTOVEN) 5 MG tablet Take 5 mg by mouth daily.     Allergies:   Darvon [propoxyphene], Lisinopril, Septra [sulfamethoxazole-trimethoprim], Warfarin and related, and Penicillins   Social History   Socioeconomic History  . Marital status: Married    Spouse name: Not on file  . Number of children: Not on file  . Years of education: Not on file  . Highest education level: Not on file  Occupational History  . Not on file  Social Needs  . Financial resource strain: Not on file  . Food insecurity    Worry: Not on file    Inability: Not on file  . Transportation needs    Medical: Not on file    Non-medical: Not on file  Tobacco Use  . Smoking status: Never Smoker  . Smokeless tobacco: Never Used  Substance and Sexual Activity  . Alcohol use: No  . Drug use: No  . Sexual activity: Yes  Lifestyle  . Physical activity    Days per week: Not on file    Minutes per session: Not on file  . Stress: Not on file  Relationships  . Social Herbalist on phone: Not on file    Gets together: Not on file    Attends religious service: Not on file    Active member of club or  organization: Not on file    Attends meetings of clubs or organizations: Not on file    Relationship status: Not on file  Other Topics Concern  . Not on file  Social History Narrative  . Not on file     Family History:  The patient's   family history includes Heart disease in her father and mother.   ROS:   Please see the history of present illness.    ROS All other systems reviewed and are negative.   PHYSICAL EXAM:   VS:  BP (!) 144/60   Pulse 61   Ht 5' (1.524 m)   Wt 135 lb (61.2 kg)   SpO2 98%   BMI 26.37 kg/m   Physical Exam  GEN: Well nourished, well developed, in no acute distress  Neck: no JVD, carotid bruits, or masses Cardiac: Irregular irregular, 1/6 systolic murmur at the left sternal Respiratory:  clear to auscultation bilaterally, normal work of breathing GI: soft, nontender, nondistended, + BS Ext: without cyanosis, clubbing, or edema, Good distal pulses bilaterally MS: no deformity or atrophy  Skin: warm and dry, no rash Neuro:  Alert and Oriented x 3, Strength and sensation are intact Psych: euthymic mood, full affect  Wt Readings from Last 3 Encounters:  08/09/19 135 lb (61.2 kg)  07/19/19 143 lb (64.9 kg)  06/29/19 137 lb 6.4 oz (62.3 kg)      Studies/Labs Reviewed:   EKG:  EKG is ordered today.  The ekg ordered today demonstrates atrial fibrillation at 71 bpm nonspecific intraventricular block, poor R wave progression anteriorly, no acute change  Recent Labs: 10/01/2018: B Natriuretic Peptide 150.0 12/02/2018: Hemoglobin 9.9; Platelets 212 06/29/2019: ALT 24; TSH 4.260 07/19/2019: BUN 37; Creatinine, Ser 1.41; Potassium 5.7; Sodium 140   Lipid Panel    Component Value Date/Time   CHOL 117 06/29/2019 0953   CHOL 172 05/01/2015 1234   TRIG 153 (H) 06/29/2019 0953   TRIG 248 (H) 05/01/2015 1234   HDL 51 06/29/2019 0953   HDL 46  05/01/2015 1234   CHOLHDL 2.3 06/29/2019 0953   LDLCALC 40 06/29/2019 0953   LDLCALC 76 05/01/2015 1234     Additional studies/ records that were reviewed today include:  TEE 12/01/2018  IMPRESSIONS      1. The left ventricle has hyperdynamic systolic function, with an ejection fraction of >65%. The cavity size was normal.  2. The right ventricle has normal systolc function. The cavity was normal. There is no increase in right ventricular wall thickness.  3. Mild mitral valve prolapse.  4. Mitral valve regurgitation is mild to moderate by color flow Doppler.  5. Tricuspid valve regurgitation is moderate.  6. Aortic valve regurgitation mild perivalvular leak.  7. Pulmonic valve regurgitation was not assessed by color flow Doppler.  8. There is evidence of plaque in the descending aorta.  9. No vegetations, no endocarditis. 10. - TAVR: TAVR valve with mild perivalvular leak. No vegetations.   FINDINGS  Left Ventricle: The left ventricle has hyperdynamic systolic function, with an ejection fraction of >65%. The cavity size was normal. There is no increase in left ventricular wall thickness. Right Ventricle: The right ventricle has normal systolic function. The cavity was normal. There is no increase in right ventricular wall thickness. Left Atrium: Left atrial size was normal in size. Right Atrium: Right atrial size was normal in size. Interatrial Septum: No atrial level shunt detected by color flow Doppler. Pericardium: There is no evidence of pericardial effusion. Mitral Valve: Mitral valve regurgitation is mild to moderate by color flow Doppler. There is mild holosystolic prolapse of of the anterior mitral leaflet of the mitral valve. Tricuspid Valve: Tricuspid valve regurgitation is moderate by color flow Doppler. Aortic Valve: Aortic valve regurgitation mild perivalvular leak. - TAVR: TAVR valve with mild perivalvular leak. No vegetations. Pulmonic Valve: Pulmonic valve regurgitation was not assessed by color flow Doppler. Aorta: There is evidence of plaque in the descending aorta. Venous:  The inferior vena cava is normal in size with greater than 50% respiratory variability.   LV Wall Scoring:   TRICUSPID VALVE TR Peak grad:   24.4 mmHg TR Vmax:        247.00 cm/s     Mark Skains MD Electronically signed by Mark Skains MD Signature Date/Time: 12/01/2018/5:26:21 PM   Echo 11/30/2018  IMPRESSIONS      1. The left ventricle has normal systolic function with an ejection fraction of 60-65%. The cavity size was normal. There is moderately increased left ventricular wall thickness. Left ventricular diastolic function could not be evaluated secondary to  atrial fibrillation. No evidence of left ventricular regional wall motion abnormalities.  2. Left atrial size was mildly dilated.  3. The mitral valve is degenerative. Moderate thickening of the mitral valve leaflet. Moderate calcification of the mitral valve leaflet. There is moderate mitral annular calcification present. A cannot exclude vegetation vegetation is seen on the  mitral leaflet.  4. Aortic valve regurgitation is trivial by color flow Doppler. mild stenosis of the aortic valve.  5. The ascending aorta and aortic root are normal in size and structure.  6. When compared to the prior study: 7/9/201: LVEF 60-65%, normal mechanical AOV prosthesis, mean gradient 28 mmHg, MAC with mild MR.  7. Cannot exclude small ASD.   SUMMARY   LVEF 60-65%, moderate LVH, normal wall motion, mechanical aortic valve with trivial AI, heavy MAC with mild MR - cannot exclude possible small mobile vegetation on the anterior mitral leaflet, a small ASD is suggested by color doppler. Recommend   further evalution with TEE/bubble study, especially for better evaluation of the valves with regards to endocarditis.  FINDINGS  Left Ventricle: The left ventricle has normal systolic function, with an ejection fraction of 60-65%. The cavity size was normal. There is moderately increased left ventricular wall thickness. Left ventricular diastolic  function could not be evaluated  secondary to atrial fibrillation. No evidence of left ventricular regional wall motion abnormalities.. Right Ventricle: The right ventricle has low normal systolic function. The cavity was normal. There is no increase in right ventricular wall thickness. Left Atrium: left atrial size was mildly dilated Right Atrium: right atrial size was normal in size. Right atrial pressure is estimated at 3 mmHg. Interatrial Septum: Cannot exclude small ASD. Pericardium: There is no evidence of pericardial effusion. Mitral Valve: The mitral valve is degenerative in appearance. Moderate thickening of the mitral valve leaflet. Moderate calcification of the mitral valve leaflet. There is moderate mitral annular calcification present. Mitral valve regurgitation is mild  by color flow Doppler. A cannot exclude vegetation vegetation is seen on the mitral leaflet. Tricuspid Valve: The tricuspid valve is normal in structure. Tricuspid valve regurgitation is mild by color flow Doppler. Aortic Valve: The aortic valve has been repaired/replaced Aortic valve regurgitation is trivial by color flow Doppler. There is mild stenosis of the aortic valve, with a calculated valve area of 0.66 cm. Pulmonic Valve: The pulmonic valve was not well visualized. Pulmonic valve regurgitation is not visualized by color flow Doppler. Aorta: The ascending aorta and aortic root are normal in size and structure. Venous: The inferior vena cava measures 1.79 cm, is normal in size with greater than 50% respiratory variability. Compared to previous exam: 7/9/201: LVEF 60-65%, normal mechanical AOV prosthesis, mean gradient 28 mmHg, MAC with mild MR.         ASSESSMENT:    1. PAF (paroxysmal atrial fibrillation) ([place])   2. Essential hypertension   3. Hyperlipidemia, unspecified hyperlipidemia type   4. Chronic diastolic CHF (congestive heart failure) ([facility])      PLAN:  In order of problems listed above:   Atrial fibrillation on amiodarone and Coumadin.  INRs have been therapeutic since 07/19/2019 has chronic dyspnea on exertion felt related to this.  We will schedule cardioversion.  Patient and husband agreeable.  Essential hypertension was elevated last visit but had not taken her medications yet-blood pressure better controlled.  Hyperlipidemia LDL 54 12/6501  Chronic diastolic CHF compensated.    Medication Adjustments/Labs and Tests Ordered: Current medicines are reviewed at length with the patient today.  Concerns regarding medicines are outlined above.  Medication changes, Labs and Tests ordered today are listed in the Patient Instructions below. There are no Patient Instructions on file for this visit.   Sumner Boast, PA-C  08/09/2019 12:26 PM    [facility] [address], [place], [place]  [address] Phone: [phone]; Fax: [phone]

## 2019-08-08 NOTE — Progress Notes (Signed)
Cardiology Office Note    Date:  08/09/2019   ID:  [name] [birth year], MRN [id number]  PCP:  Gaynelle Arabian, MD  Cardiologist: Larae Grooms, MD EPS: None  Chief Complaint  Patient presents with  . Follow-up    History of Present Illness:  [name] is a 85 y.o. female with a hx of CAD and aortic valve disease status post CABG and mechanical AVR cardiac cath 03/2015, subsequent PCI to the RCA with BMS,LIMA-LAD was patent, SVG-diagonal patent, SVG-OM/PDA occluded. The second half of the OM/PDA graft was open. RCA stents were patent. She had an ostial RCA lesion of 75% which was treated with a Synergy DES. Because she needs Coumadin for her mechanical AVR, she was continued on Coumadin plus Plavix only (no aspirin). Also has HTN, HL, diabetes, prior GI bleed, CKD, diastolic CHF, PAF.    Patient had sepsis due to gram-negative bacteremia E. coli related to UTI 11/2018.  Echo with normal LV function moderate LVH normally functioning mechanical aortic valve possible mitral valve with small mobile vegetation in the anterior leaflet and small ASD suggested by Doppler TEE was negative for vegetations.   Last office visit with Dr. Irish Lack 06/29/2019 felt her sudden dyspnea on exertion was due to recent onset atrial fibrillation.  He increase metoprolol to 25 mg twice daily.  If symptoms do not improve he recommended amiodarone and cardioversion in a few weeks.  He is concerned about her Coumadin if we start amiodarone close monitoring of her INRs.  As she is sensitive to changes in her Coumadin and has had GI bleed in the past.    I had a telemedicine visit with the patient 07/19/2019 at which time she was complaining of dyspnea on exertion with little activity.  She did not increase her metoprolol to 25 mg twice daily because she is very forgetful although her husband claims she did..  EKG 07/19/2019 showed atrial fibrillation with controlled ventricular rate.  I started  amiodarone 400 mg twice daily for 8 days then 200 mg daily and reduce metoprolol to 25 mg once a day.  Patient still having dyspnea on exertion. Has a cold and has chest tightness when she has her coughing spells. Has had for a couple of years.Not like her angina.   Past Medical History:  Diagnosis Date  . Anemia   . Anginal pain ([place])   . Arteriovenous malformation of gastrointestinal tract   . Arthritis    "fingers mostly" (04/12/2015)  . Carotid artery disease ([place])    Carotid US 3/17:  69-45% RICA; 03-88% LICA; Elevated bilateral subclavian artery velocities >>f/u 1 year. // Carotid US 4/18: R 40-59; L 1-39 >> FU 1 year // Carotid US 10/2018: R 40-59; L 1-39, L subclavian stenosis   . CHF (congestive heart failure) ([place])   . Chronic kidney disease (CKD), stage III (moderate)   . Chronic lower GI bleeding    "today; last time was ~ 8 yr ago; used to have them often before that too" (02/11/2013)  . Coronary artery disease   . DJD (degenerative joint disease)   . Dysrhythmia   . Heart murmur   . History of blood transfusion    "a few times over the years; usually related to my Coumadin" (04/12/2015  . History of gout   . Hyperlipemia   . Hypertension   . Macula lutea degeneration   . Old MI (myocardial infarction)    "a coulple /dr in 02/2008; I never  even knew I'd had them" (04/12/2015)  . Type II diabetes mellitus ([place])     Past Surgical History:  Procedure Laterality Date  . AORTIC VALVE REPLACEMENT  1993  . APPENDECTOMY  1953  . CARDIAC CATHETERIZATION  ~1990  . CARDIAC CATHETERIZATION  04/12/2015   Procedure: Coronary Stent Intervention;  Surgeon: Jettie Booze, MD;    SYNERGY DES 3.5X16 to the ostial RCA   . CARDIAC CATHETERIZATION  04/12/2015   Procedure: Coronary/Graft Angiography;  Surgeon: Eloy End, MD; LAD & CFX 100%, patent LIMA-LAD, SVG-D1; SVG-OM-PDA first limb 100%, 2nd limb patent; oRCA 75%>0 w/ stent  . [place]   [facility]  [place]/notes 10/29/2003 (02/11/2013)  . COLONOSCOPY N/A 02/13/2013   Procedure: COLONOSCOPY;  Surgeon: Lafayette Dragon, MD;  Location: [facility] ENDOSCOPY;  Service: Endoscopy;  Laterality: N/A;  . CORONARY ANGIOPLASTY WITH STENT PLACEMENT  2009+   "3 at least; put in 1 stent at a time" (02/11/2013)  . CORONARY ARTERY BYPASS GRAFT  1990   LIMA-LAD, SVG-OM-PDA, SVG-D1  . DILATION AND CURETTAGE OF UTERUS  1960's   'after a miscarriage" (02/11/2013)  . ENTEROSCOPY N/A 02/13/2013   Procedure: ENTEROSCOPY;  Surgeon: Lafayette Dragon, MD;  Location: [facility] ENDOSCOPY;  Service: Endoscopy;  Laterality: N/A;  . EXPLORATORY LAPAROTOMY  02/24/2008   which revealed a retroperitoneal hematoma and bleeding from the right external iliac artery/notes 03/02/2008 (02/11/2013)   . FLEXIBLE SIGMOIDOSCOPY N/A 04/21/2015   Procedure: FLEXIBLE SIGMOIDOSCOPY;  Surgeon: Ladene Artist, MD;  Location: [facility] ENDOSCOPY;  Service: Endoscopy;  Laterality: N/A;  . TEE WITHOUT CARDIOVERSION N/A 12/01/2018   Procedure: TRANSESOPHAGEAL ECHOCARDIOGRAM (TEE);  Surgeon: Jerline Pain, MD;  Location[facility] ENDOSCOPY;  Service: Cardiovascular;  Laterality: N/A;    Current Medications: Current Meds  Medication Sig  . amiodarone (PACERONE) 200 MG tablet Take 2 tablets (400 mg) by mouth twice a day for 8 days, then take 1 tablet (200 mg) by mouth once a day  . aspirin EC 81 MG tablet Take 1 tablet (81 mg total) by mouth daily.  Marland Kitchen atorvastatin (LIPITOR) 80 MG tablet Take 1 tablet by mouth once daily  . bevacizumab (AVASTIN) 1.25 mg/0.1 mL SOLN Place 1.25 mg into the left eye every 8 (eight) weeks.  Marland Kitchen ezetimibe (ZETIA) 10 MG tablet TAKE 1 TABLET BY MOUTH ONCE DAILY PLEASE  MAKE  OVERDUE  APPOINTMENT  WITH  DR  Irish Lack  BEFORE  ANYMORE  REFILLS  . FeFum-FePo-FA-B Cmp-C-Zn-Mn-Cu (SE-TAN PLUS) 162-115.2-1 MG CAPS Take 1 tablet by mouth 2 (two) times daily.  . folic acid (FOLVITE) 347 MCG tablet Take 400 mcg by mouth every evening.   . furosemide (LASIX) 40 MG tablet  Take 40 mg by mouth daily.  . isosorbide mononitrate (IMDUR) 60 MG 24 hr tablet Take 1 tablet (60 mg total) by mouth daily.  Marland Kitchen JANUVIA 50 MG tablet TAKE 1 TABLET BY MOUTH ONCE DAILY FOR DIABETES  . losartan (COZAAR) 50 MG tablet Take 50 mg by mouth daily.  . metoprolol tartrate (LOPRESSOR) 25 MG tablet Take 1 tablet (25 mg total) by mouth daily.  . Multiple Vitamin (MULTIVITAMIN WITH MINERALS) TABS Take 1 tablet by mouth daily.  . Multiple Vitamins-Minerals (PRESERVISION AREDS 2 PO) Take 1 tablet by mouth 2 (two) times daily.   . nitroGLYCERIN (NITROSTAT) 0.4 MG SL tablet Place 1 tablet (0.4 mg total) under the tongue every 5 (five) minutes as needed for chest pain. Please make appt for January. 1st attempt  .  Omega-3 Fatty Acids 1200 MG CAPS Take 1 capsule by mouth 2 (two) times daily.   Marland Kitchen warfarin (JANTOVEN) 5 MG tablet Take 5 mg by mouth daily.     Allergies:   Darvon [propoxyphene], Lisinopril, Septra [sulfamethoxazole-trimethoprim], Warfarin and related, and Penicillins   Social History   Socioeconomic History  . Marital status: Married    Spouse name: Not on file  . Number of children: Not on file  . Years of education: Not on file  . Highest education level: Not on file  Occupational History  . Not on file  Social Needs  . Financial resource strain: Not on file  . Food insecurity    Worry: Not on file    Inability: Not on file  . Transportation needs    Medical: Not on file    Non-medical: Not on file  Tobacco Use  . Smoking status: Never Smoker  . Smokeless tobacco: Never Used  Substance and Sexual Activity  . Alcohol use: No  . Drug use: No  . Sexual activity: Yes  Lifestyle  . Physical activity    Days per week: Not on file    Minutes per session: Not on file  . Stress: Not on file  Relationships  . Social Herbalist on phone: Not on file    Gets together: Not on file    Attends religious service: Not on file    Active member of club or  organization: Not on file    Attends meetings of clubs or organizations: Not on file    Relationship status: Not on file  Other Topics Concern  . Not on file  Social History Narrative  . Not on file     Family History:  The patient's   family history includes Heart disease in her father and mother.   ROS:   Please see the history of present illness.    ROS All other systems reviewed and are negative.   PHYSICAL EXAM:   VS:  BP (!) 144/60   Pulse 61   Ht 5' (1.524 m)   Wt 135 lb (61.2 kg)   SpO2 98%   BMI 26.37 kg/m   Physical Exam  GEN: Well nourished, well developed, in no acute distress  Neck: no JVD, carotid bruits, or masses Cardiac: Irregular irregular, 1/6 systolic murmur at the left sternal Respiratory:  clear to auscultation bilaterally, normal work of breathing GI: soft, nontender, nondistended, + BS Ext: without cyanosis, clubbing, or edema, Good distal pulses bilaterally MS: no deformity or atrophy  Skin: warm and dry, no rash Neuro:  Alert and Oriented x 3, Strength and sensation are intact Psych: euthymic mood, full affect  Wt Readings from Last 3 Encounters:  08/09/19 135 lb (61.2 kg)  07/19/19 143 lb (64.9 kg)  06/29/19 137 lb 6.4 oz (62.3 kg)      Studies/Labs Reviewed:   EKG:  EKG is ordered today.  The ekg ordered today demonstrates atrial fibrillation at 71 bpm nonspecific intraventricular block, poor R wave progression anteriorly, no acute change  Recent Labs: 10/01/2018: B Natriuretic Peptide 150.0 12/02/2018: Hemoglobin 9.9; Platelets 212 06/29/2019: ALT 24; TSH 4.260 07/19/2019: BUN 37; Creatinine, Ser 1.41; Potassium 5.7; Sodium 140   Lipid Panel    Component Value Date/Time   CHOL 117 06/29/2019 0953   CHOL 172 05/01/2015 1234   TRIG 153 (H) 06/29/2019 0953   TRIG 248 (H) 05/01/2015 1234   HDL 51 06/29/2019 0953   HDL 46  05/01/2015 1234   CHOLHDL 2.3 06/29/2019 0953   LDLCALC 40 06/29/2019 0953   LDLCALC 76 05/01/2015 1234     Additional studies/ records that were reviewed today include:  TEE 12/01/2018  IMPRESSIONS      1. The left ventricle has hyperdynamic systolic function, with an ejection fraction of >65%. The cavity size was normal.  2. The right ventricle has normal systolc function. The cavity was normal. There is no increase in right ventricular wall thickness.  3. Mild mitral valve prolapse.  4. Mitral valve regurgitation is mild to moderate by color flow Doppler.  5. Tricuspid valve regurgitation is moderate.  6. Aortic valve regurgitation mild perivalvular leak.  7. Pulmonic valve regurgitation was not assessed by color flow Doppler.  8. There is evidence of plaque in the descending aorta.  9. No vegetations, no endocarditis. 10. - TAVR: TAVR valve with mild perivalvular leak. No vegetations.   FINDINGS  Left Ventricle: The left ventricle has hyperdynamic systolic function, with an ejection fraction of >65%. The cavity size was normal. There is no increase in left ventricular wall thickness. Right Ventricle: The right ventricle has normal systolic function. The cavity was normal. There is no increase in right ventricular wall thickness. Left Atrium: Left atrial size was normal in size. Right Atrium: Right atrial size was normal in size. Interatrial Septum: No atrial level shunt detected by color flow Doppler. Pericardium: There is no evidence of pericardial effusion. Mitral Valve: Mitral valve regurgitation is mild to moderate by color flow Doppler. There is mild holosystolic prolapse of of the anterior mitral leaflet of the mitral valve. Tricuspid Valve: Tricuspid valve regurgitation is moderate by color flow Doppler. Aortic Valve: Aortic valve regurgitation mild perivalvular leak. - TAVR: TAVR valve with mild perivalvular leak. No vegetations. Pulmonic Valve: Pulmonic valve regurgitation was not assessed by color flow Doppler. Aorta: There is evidence of plaque in the descending aorta. Venous:  The inferior vena cava is normal in size with greater than 50% respiratory variability.   LV Wall Scoring:   TRICUSPID VALVE TR Peak grad:   24.4 mmHg TR Vmax:        247.00 cm/s     Candee Furbish MD Electronically signed by Candee Furbish MD Signature Date/Time: 12/01/2018/5:26:21 PM   Echo 11/30/2018  IMPRESSIONS      1. The left ventricle has normal systolic function with an ejection fraction of 60-65%. The cavity size was normal. There is moderately increased left ventricular wall thickness. Left ventricular diastolic function could not be evaluated secondary to  atrial fibrillation. No evidence of left ventricular regional wall motion abnormalities.  2. Left atrial size was mildly dilated.  3. The mitral valve is degenerative. Moderate thickening of the mitral valve leaflet. Moderate calcification of the mitral valve leaflet. There is moderate mitral annular calcification present. A cannot exclude vegetation vegetation is seen on the  mitral leaflet.  4. Aortic valve regurgitation is trivial by color flow Doppler. mild stenosis of the aortic valve.  5. The ascending aorta and aortic root are normal in size and structure.  6. When compared to the prior study: 7/9/201: LVEF 60-65%, normal mechanical AOV prosthesis, mean gradient 28 mmHg, MAC with mild MR.  7. Cannot exclude small ASD.   SUMMARY   LVEF 60-65%, moderate LVH, normal wall motion, mechanical aortic valve with trivial AI, heavy MAC with mild MR - cannot exclude possible small mobile vegetation on the anterior mitral leaflet, a small ASD is suggested by color doppler. Recommend  further evalution with TEE/bubble study, especially for better evaluation of the valves with regards to endocarditis.  FINDINGS  Left Ventricle: The left ventricle has normal systolic function, with an ejection fraction of 60-65%. The cavity size was normal. There is moderately increased left ventricular wall thickness. Left ventricular diastolic  function could not be evaluated  secondary to atrial fibrillation. No evidence of left ventricular regional wall motion abnormalities.. Right Ventricle: The right ventricle has low normal systolic function. The cavity was normal. There is no increase in right ventricular wall thickness. Left Atrium: left atrial size was mildly dilated Right Atrium: right atrial size was normal in size. Right atrial pressure is estimated at 3 mmHg. Interatrial Septum: Cannot exclude small ASD. Pericardium: There is no evidence of pericardial effusion. Mitral Valve: The mitral valve is degenerative in appearance. Moderate thickening of the mitral valve leaflet. Moderate calcification of the mitral valve leaflet. There is moderate mitral annular calcification present. Mitral valve regurgitation is mild  by color flow Doppler. A cannot exclude vegetation vegetation is seen on the mitral leaflet. Tricuspid Valve: The tricuspid valve is normal in structure. Tricuspid valve regurgitation is mild by color flow Doppler. Aortic Valve: The aortic valve has been repaired/replaced Aortic valve regurgitation is trivial by color flow Doppler. There is mild stenosis of the aortic valve, with a calculated valve area of 0.66 cm. Pulmonic Valve: The pulmonic valve was not well visualized. Pulmonic valve regurgitation is not visualized by color flow Doppler. Aorta: The ascending aorta and aortic root are normal in size and structure. Venous: The inferior vena cava measures 1.79 cm, is normal in size with greater than 50% respiratory variability. Compared to previous exam: 7/9/201: LVEF 60-65%, normal mechanical AOV prosthesis, mean gradient 28 mmHg, MAC with mild MR.         ASSESSMENT:    1. PAF (paroxysmal atrial fibrillation) ([place])   2. Essential hypertension   3. Hyperlipidemia, unspecified hyperlipidemia type   4. Chronic diastolic CHF (congestive heart failure) ([facility])      PLAN:  In order of problems listed above:   Atrial fibrillation on amiodarone and Coumadin.  INRs have been therapeutic since 07/19/2019 has chronic dyspnea on exertion felt related to this.  We will schedule cardioversion.  Patient and husband agreeable.  Essential hypertension was elevated last visit but had not taken her medications yet-blood pressure better controlled.  Hyperlipidemia LDL 54 12/6501  Chronic diastolic CHF compensated.    Medication Adjustments/Labs and Tests Ordered: Current medicines are reviewed at length with the patient today.  Concerns regarding medicines are outlined above.  Medication changes, Labs and Tests ordered today are listed in the Patient Instructions below. There are no Patient Instructions on file for this visit.   Sumner Boast, PA-C  08/09/2019 12:26 PM    [facility] [address], [place], [place]  [address] Phone: [phone]; Fax: [phone]

## 2019-08-09 ENCOUNTER — Other Ambulatory Visit: Payer: Self-pay

## 2019-08-09 ENCOUNTER — Encounter: Payer: Self-pay | Admitting: Physician Assistant

## 2019-08-09 ENCOUNTER — Ambulatory Visit (INDEPENDENT_AMBULATORY_CARE_PROVIDER_SITE_OTHER): Payer: Medicare Other | Admitting: *Deleted

## 2019-08-09 ENCOUNTER — Telehealth: Payer: Self-pay | Admitting: *Deleted

## 2019-08-09 ENCOUNTER — Ambulatory Visit (INDEPENDENT_AMBULATORY_CARE_PROVIDER_SITE_OTHER): Payer: Medicare Other | Admitting: Physician Assistant

## 2019-08-09 VITALS — BP 144/60 | HR 61 | Ht 60.0 in | Wt 135.0 lb

## 2019-08-09 DIAGNOSIS — E785 Hyperlipidemia, unspecified: Secondary | ICD-10-CM | POA: Diagnosis not present

## 2019-08-09 DIAGNOSIS — I359 Nonrheumatic aortic valve disorder, unspecified: Secondary | ICD-10-CM

## 2019-08-09 DIAGNOSIS — I10 Essential (primary) hypertension: Secondary | ICD-10-CM | POA: Diagnosis not present

## 2019-08-09 DIAGNOSIS — Z5181 Encounter for therapeutic drug level monitoring: Secondary | ICD-10-CM | POA: Diagnosis not present

## 2019-08-09 DIAGNOSIS — I480 Paroxysmal atrial fibrillation: Secondary | ICD-10-CM | POA: Diagnosis not present

## 2019-08-09 DIAGNOSIS — I5032 Chronic diastolic (congestive) heart failure: Secondary | ICD-10-CM | POA: Diagnosis not present

## 2019-08-09 DIAGNOSIS — I6523 Occlusion and stenosis of bilateral carotid arteries: Secondary | ICD-10-CM | POA: Diagnosis not present

## 2019-08-09 LAB — POCT INR: INR: 2.9 (ref 2.0–3.0)

## 2019-08-09 NOTE — Telephone Encounter (Addendum)
Pt's DCCV scheduled. Called pt to attempt to reschedule her coumadin check to be a week after her cardioversion. According to [place], Ermalinda Barrios stated pt dose not need to get her INR checked the day of Cardioversion. Pt has appointment scheduled for 11/30.

## 2019-08-09 NOTE — Patient Instructions (Signed)
Description   Take 1/2 a tablet today, then continue on same dosage 1 tablet every day except 1/2 tablet on Sundays, Wednesdays, and Fridays. Recheck INR in 1 week pending Cardioversion. Call our office if you have any problems or concerns [phone].

## 2019-08-09 NOTE — Patient Instructions (Addendum)
Medication Instructions:  Your physician recommends that you continue on your current medications as directed. Please refer to the Current Medication list given to you today.  *If you need a refill on your cardiac medications before your next appointment, please call your pharmacy*  Lab Work: BMET and CBC on Friday 08/12/19  If you have labs (blood work) drawn today and your tests are completely normal, you will receive your results only by: Marland Kitchen MyChart Message (if you have MyChart) OR . A paper copy in the mail If you have any lab test that is abnormal or we need to change your treatment, we will call you to review the results.  Testing/Procedures: Your physician has recommended that you have a Cardioversion (DCCV). Electrical Cardioversion uses a jolt of electricity to your heart either through paddles or wired patches attached to your chest. This is a controlled, usually prescheduled, procedure. Defibrillation is done under light anesthesia in the hospital, and you usually go home the day of the procedure. This is done to get your heart back into a normal rhythm. You are not awake for the procedure. Please see the instruction sheet given to you today.   Follow-Up: At [facility], you and your health needs are our priority.  As part of our continuing mission to provide you with exceptional heart care, we have created designated Provider Care Teams.  These Care Teams include your primary Cardiologist (physician) and Advanced Practice Providers (APPs -  Physician Assistants and Nurse Practitioners) who all work together to provide you with the care you need, when you need it.  Follow-Up with Estella Husk, PA-C on 09/06/19 at 2:00PM  Other Instructions  Dear [name], You are scheduled for a Cardioversion on 08/15/19 with Dr. Acie Fredrickson.  Please arrive at the [facility] (Main Entrance A) at [facility]: [address] [place], [place] [address] at 9:30 am.  DIET: Nothing to eat  or drink after midnight except a sip of water with medications (see medication instructions below)  Medication Instructions: Hold Lasix and Januvia the morning of your procedure.   Continue your anticoagulant: Coumadin You will need to continue your anticoagulant after your procedure until you are told by your Provider that it is safe to stop   Labs:  Your physician recommends that you return for lab work on 08/12/19 at 1:30PM for BMET, CBC, and INR  Your Pre-procedure COVID-19 Testing will be done on 11/20 at 2:00PM at [facility] at [address], [place], [place] [address]. Once you arrive at the testing site, stay in the right hand lane, go under the building overhang not the tent. If you are tested under the tent your results may not be back before your procedure. Please be on time for your appointment.  After your swab you will be given a mask to wear and instructed to go home and quarantine/no visitors until after your procedure. If you test positive you will be notified and your procedure will be cancelled.    You must have a responsible person to drive you home and stay in the waiting area during your procedure. Failure to do so could result in cancellation.  Bring your insurance cards.  *Special Note: Every effort is made to have your procedure done on time. Occasionally there are emergencies that occur at the hospital that may cause delays. Please be patient if a delay does occur.    Electrical Cardioversion  Electrical cardioversion is the delivery of a  jolt of electricity to restore a normal rhythm to the heart. A rhythm that is too fast or is not regular keeps the heart from pumping well. In this procedure, sticky patches or metal paddles are placed on the chest to deliver electricity to the heart from a device. This procedure may be done in an emergency if:  There is low or no blood pressure as a result of the heart rhythm.  Normal rhythm  must be restored as fast as possible to protect the brain and heart from further damage.  It may save a life. This procedure may also be done for irregular or fast heart rhythms that are not immediately life-threatening. Tell a health care provider about:  Any allergies you have.  All medicines you are taking, including vitamins, herbs, eye drops, creams, and over-the-counter medicines.  Any problems you or family members have had with anesthetic medicines.  Any blood disorders you have.  Any surgeries you have had.  Any medical conditions you have.  Whether you are pregnant or may be pregnant. What are the risks? Generally, this is a safe procedure. However, problems may occur, including:  Allergic reactions to medicines.  A blood clot that breaks free and travels to other parts of your body.  The possible return of an abnormal heart rhythm within hours or days after the procedure.  Your heart stopping (cardiac arrest). This is rare. What happens before the procedure? Medicines  Your health care provider may have you start taking: ? Blood-thinning medicines (anticoagulants) so your blood does not clot as easily. ? Medicines may be given to help stabilize your heart rate and rhythm.  Ask your health care provider about changing or stopping your regular medicines. This is especially important if you are taking diabetes medicines or blood thinners. General instructions  Plan to have someone take you home from the hospital or clinic.  If you will be going home right after the procedure, plan to have someone with you for 24 hours.  Follow instructions from your health care provider about eating or drinking restrictions. What happens during the procedure?  To lower your risk of infection: ? Your health care team will wash or sanitize their hands. ? Your skin will be washed with soap.  An IV tube will be inserted into one of your veins.  You will be given a medicine to  help you relax (sedative).  Sticky patches (electrodes) or metal paddles may be placed on your chest.  An electrical shock will be delivered. The procedure may vary among health care providers and hospitals. What happens after the procedure?   Your blood pressure, heart rate, breathing rate, and blood oxygen level will be monitored until the medicines you were given have worn off.  Do not drive for 24 hours if you were given a sedative.  Your heart rhythm will be watched to make sure it does not change. This information is not intended to replace advice given to you by your health care provider. Make sure you discuss any questions you have with your health care provider. Document Released: 08/29/2002 Document Revised: 08/21/2017 Document Reviewed: 03/14/2016 Elsevier Patient Education  2020 [employer].

## 2019-08-12 ENCOUNTER — Other Ambulatory Visit (HOSPITAL_COMMUNITY)
Admission: RE | Admit: 2019-08-12 | Discharge: 2019-08-12 | Disposition: A | Discharge status: Home / Self Care | Payer: Medicare Other | Source: Ambulatory Visit | Attending: Cardiovascular Disease | Admitting: Cardiovascular Disease

## 2019-08-12 ENCOUNTER — Other Ambulatory Visit: Payer: Medicare Other | Admitting: *Deleted

## 2019-08-12 ENCOUNTER — Other Ambulatory Visit: Payer: Self-pay

## 2019-08-12 DIAGNOSIS — E785 Hyperlipidemia, unspecified: Secondary | ICD-10-CM

## 2019-08-12 DIAGNOSIS — Z01812 Encounter for preprocedural laboratory examination: Secondary | ICD-10-CM | POA: Insufficient documentation

## 2019-08-12 DIAGNOSIS — I5032 Chronic diastolic (congestive) heart failure: Secondary | ICD-10-CM | POA: Diagnosis not present

## 2019-08-12 DIAGNOSIS — Z20828 Contact with and (suspected) exposure to other viral communicable diseases: Secondary | ICD-10-CM | POA: Diagnosis not present

## 2019-08-12 DIAGNOSIS — I10 Essential (primary) hypertension: Secondary | ICD-10-CM

## 2019-08-12 DIAGNOSIS — I480 Paroxysmal atrial fibrillation: Secondary | ICD-10-CM | POA: Diagnosis not present

## 2019-08-12 LAB — SARS CORONAVIRUS 2 (TAT 6-24 HRS): SARS Coronavirus 2: NEGATIVE

## 2019-08-13 LAB — BASIC METABOLIC PANEL
BUN/Creatinine Ratio: 22 (ref 12–28)
BUN: 41 mg/dL — ABNORMAL HIGH (ref 8–27)
CO2: 24 mmol/L (ref 20–29)
Calcium: 9.4 mg/dL (ref 8.7–10.3)
Chloride: 101 mmol/L (ref 96–106)
Creatinine, Ser: 1.83 mg/dL — ABNORMAL HIGH (ref 0.57–1.00)
GFR calc Af Amer: 29 mL/min/{1.73_m2} — ABNORMAL LOW (ref >59)
GFR calc non Af Amer: 25 mL/min/{1.73_m2} — ABNORMAL LOW (ref >59)
Glucose: 140 mg/dL — ABNORMAL HIGH (ref 65–99)
Potassium: 4.9 mmol/L (ref 3.5–5.2)
Sodium: 140 mmol/L (ref 134–144)

## 2019-08-13 LAB — CBC
Hematocrit: 33 % — ABNORMAL LOW (ref 34.0–46.6)
Hemoglobin: 10.9 g/dL — ABNORMAL LOW (ref 11.1–15.9)
MCH: 29.7 pg (ref 26.6–33.0)
MCHC: 33 g/dL (ref 31.5–35.7)
MCV: 90 fL (ref 79–97)
Platelets: 228 10*3/uL (ref 150–450)
RBC: 3.67 x10E6/uL — ABNORMAL LOW (ref 3.77–5.28)
RDW: 13.6 % (ref 11.7–15.4)
WBC: 7.9 10*3/uL (ref 3.4–10.8)

## 2019-08-13 LAB — PROTIME-INR
INR: 2.7 — ABNORMAL HIGH (ref 0.9–1.2)
Prothrombin Time: 27.6 s — ABNORMAL HIGH (ref 9.1–12.0)

## 2019-08-15 ENCOUNTER — Ambulatory Visit (HOSPITAL_COMMUNITY)
Admission: RE | Admit: 2019-08-15 | Discharge: 2019-08-15 | Disposition: A | Discharge status: Home / Self Care | Payer: Medicare Other | Attending: Cardiovascular Disease | Admitting: Cardiovascular Disease

## 2019-08-15 ENCOUNTER — Encounter (HOSPITAL_COMMUNITY): Payer: Self-pay

## 2019-08-15 ENCOUNTER — Other Ambulatory Visit: Payer: Self-pay

## 2019-08-15 ENCOUNTER — Encounter (HOSPITAL_COMMUNITY)
Admission: RE | Disposition: A | Discharge status: Home / Self Care | Payer: Self-pay | Source: Home / Self Care | Attending: Cardiovascular Disease

## 2019-08-15 ENCOUNTER — Ambulatory Visit (HOSPITAL_COMMUNITY): Payer: Medicare Other | Admitting: Certified Registered Nurse Anesthetist

## 2019-08-15 DIAGNOSIS — Z882 Allergy status to sulfonamides status: Secondary | ICD-10-CM | POA: Insufficient documentation

## 2019-08-15 DIAGNOSIS — Z888 Allergy status to other drugs, medicaments and biological substances status: Secondary | ICD-10-CM | POA: Diagnosis not present

## 2019-08-15 DIAGNOSIS — Z7984 Long term (current) use of oral hypoglycemic drugs: Secondary | ICD-10-CM | POA: Insufficient documentation

## 2019-08-15 DIAGNOSIS — I700 Atherosclerosis of aorta: Secondary | ICD-10-CM | POA: Diagnosis not present

## 2019-08-15 DIAGNOSIS — M1990 Unspecified osteoarthritis, unspecified site: Secondary | ICD-10-CM | POA: Diagnosis not present

## 2019-08-15 DIAGNOSIS — Z951 Presence of aortocoronary bypass graft: Secondary | ICD-10-CM | POA: Diagnosis not present

## 2019-08-15 DIAGNOSIS — E1122 Type 2 diabetes mellitus with diabetic chronic kidney disease: Secondary | ICD-10-CM | POA: Diagnosis not present

## 2019-08-15 DIAGNOSIS — I252 Old myocardial infarction: Secondary | ICD-10-CM | POA: Insufficient documentation

## 2019-08-15 DIAGNOSIS — E785 Hyperlipidemia, unspecified: Secondary | ICD-10-CM | POA: Insufficient documentation

## 2019-08-15 DIAGNOSIS — I341 Nonrheumatic mitral (valve) prolapse: Secondary | ICD-10-CM | POA: Insufficient documentation

## 2019-08-15 DIAGNOSIS — I5032 Chronic diastolic (congestive) heart failure: Secondary | ICD-10-CM | POA: Diagnosis not present

## 2019-08-15 DIAGNOSIS — I480 Paroxysmal atrial fibrillation: Secondary | ICD-10-CM | POA: Insufficient documentation

## 2019-08-15 DIAGNOSIS — Z79899 Other long term (current) drug therapy: Secondary | ICD-10-CM | POA: Insufficient documentation

## 2019-08-15 DIAGNOSIS — I2510 Atherosclerotic heart disease of native coronary artery without angina pectoris: Secondary | ICD-10-CM | POA: Diagnosis not present

## 2019-08-15 DIAGNOSIS — Z880 Allergy status to penicillin: Secondary | ICD-10-CM | POA: Diagnosis not present

## 2019-08-15 DIAGNOSIS — Z952 Presence of prosthetic heart valve: Secondary | ICD-10-CM | POA: Insufficient documentation

## 2019-08-15 DIAGNOSIS — Z881 Allergy status to other antibiotic agents status: Secondary | ICD-10-CM | POA: Insufficient documentation

## 2019-08-15 DIAGNOSIS — I4891 Unspecified atrial fibrillation: Secondary | ICD-10-CM | POA: Diagnosis not present

## 2019-08-15 DIAGNOSIS — I130 Hypertensive heart and chronic kidney disease with heart failure and stage 1 through stage 4 chronic kidney disease, or unspecified chronic kidney disease: Secondary | ICD-10-CM | POA: Insufficient documentation

## 2019-08-15 DIAGNOSIS — Z8249 Family history of ischemic heart disease and other diseases of the circulatory system: Secondary | ICD-10-CM | POA: Diagnosis not present

## 2019-08-15 DIAGNOSIS — Z7982 Long term (current) use of aspirin: Secondary | ICD-10-CM | POA: Insufficient documentation

## 2019-08-15 DIAGNOSIS — N183 Chronic kidney disease, stage 3 unspecified: Secondary | ICD-10-CM | POA: Insufficient documentation

## 2019-08-15 DIAGNOSIS — Z955 Presence of coronary angioplasty implant and graft: Secondary | ICD-10-CM | POA: Insufficient documentation

## 2019-08-15 DIAGNOSIS — Z7901 Long term (current) use of anticoagulants: Secondary | ICD-10-CM | POA: Diagnosis not present

## 2019-08-15 HISTORY — PX: CARDIOVERSION: SHX1299

## 2019-08-15 LAB — GLUCOSE, CAPILLARY: Glucose-Capillary: 105 mg/dL — ABNORMAL HIGH (ref 70–99)

## 2019-08-15 SURGERY — CARDIOVERSION
Anesthesia: General

## 2019-08-15 MED ORDER — PROPOFOL 10 MG/ML IV BOLUS
INTRAVENOUS | Status: DC | PRN
  Administered 2019-08-15: 30 mg via INTRAVENOUS

## 2019-08-15 MED ORDER — LIDOCAINE 2% (20 MG/ML) 5 ML SYRINGE
INTRAMUSCULAR | Status: DC | PRN
  Administered 2019-08-15: 40 mg via INTRAVENOUS

## 2019-08-15 MED ORDER — HYDROCORTISONE 1 % EX CREA
1.0000 "application " | TOPICAL_CREAM | Freq: Three times a day (TID) | CUTANEOUS | Status: DC | PRN
  Filled 2019-08-15: qty 28

## 2019-08-15 MED ORDER — SODIUM CHLORIDE 0.9 % IV SOLN
INTRAVENOUS | Status: DC | PRN
  Administered 2019-08-15: 11:00:00 via INTRAVENOUS

## 2019-08-15 NOTE — Anesthesia Procedure Notes (Signed)
Date/Time: 08/15/2019 10:43 AM Performed by: Janene Harvey, CRNA Pre-anesthesia Checklist: Patient identified, Emergency Drugs available, Suction available and Patient being monitored Patient Re-evaluated:Patient Re-evaluated prior to induction Oxygen Delivery Method: Ambu bag Dental Injury: Teeth and Oropharynx as per pre-operative assessment

## 2019-08-15 NOTE — Interval H&P Note (Signed)
History and Physical Interval Note:  08/15/2019 10:26 AM  [name]  has presented today for surgery, with the diagnosis of AFIB.  The various methods of treatment have been discussed with the patient and family. After consideration of risks, benefits and other options for treatment, the patient has consented to  Procedure(s): CARDIOVERSION (N/A) as a surgical intervention.  The patient's history has been reviewed, patient examined, no change in status, stable for surgery.  I have reviewed the patient's chart and labs.  Questions were answered to the patient's satisfaction.     Mertie Moores

## 2019-08-15 NOTE — Discharge Instructions (Signed)

## 2019-08-15 NOTE — Transfer of Care (Signed)
Immediate Anesthesia Transfer of Care Note  Patient: [name]  Procedure(s) Performed: CARDIOVERSION (N/A )  Patient Location: Endoscopy Unit  Anesthesia Type:General  Level of Consciousness: awake  Airway & Oxygen Therapy: Patient Spontanous Breathing  Post-op Assessment: Report given to RN and Post -op Vital signs reviewed and stable  Post vital signs: Reviewed  Last Vitals:  Vitals Value Taken Time  BP    Temp    Pulse    Resp    SpO2      Last Pain:  Vitals:   08/15/19 1026  TempSrc: Temporal  PainSc: 0-No pain         Complications: No apparent anesthesia complications

## 2019-08-15 NOTE — Anesthesia Preprocedure Evaluation (Addendum)
Anesthesia Evaluation  Patient identified by MRN, date of birth, ID band Patient awake    Reviewed: Allergy & Precautions, NPO status , Patient's Chart, lab work & pertinent test results, reviewed documented beta blocker date and time   Airway Mallampati: II  TM Distance: >3 FB Neck ROM: Full    Dental no notable dental hx. (+) Teeth Intact, Dental Advisory Given   Pulmonary neg pulmonary ROS,    Pulmonary exam normal breath sounds clear to auscultation       Cardiovascular hypertension, Pt. on home beta blockers and Pt. on medications + angina + CAD, + Past MI, + Cardiac Stents (2009), + CABG (1990), + Peripheral Vascular Disease and +CHF  Normal cardiovascular exam+ dysrhythmias Atrial Fibrillation + Valvular Problems/Murmurs (s/p AVR 1993)  Rhythm:Irregular Rate:Normal  TEE 11/2018  1. The left ventricle has hyperdynamic systolic function, with an ejection fraction of >65%. The cavity size was normal.  2. The right ventricle has normal systolc function. The cavity was normal. There is no increase in right ventricular wall thickness.  3. Mild mitral valve prolapse.  4. Mitral valve regurgitation is mild to moderate by color flow Doppler.  5. Tricuspid valve regurgitation is moderate.  6. Aortic valve regurgitation mild perivalvular leak.  7. Pulmonic valve regurgitation was not assessed by color flow Doppler.  8. There is evidence of plaque in the descending aorta.  9. No vegetations, no endocarditis. 10. - TAVR: TAVR valve with mild perivalvular leak. No vegetations.  LHC 2016 Severe native three-vessel coronary artery disease. Patent LIMA to LAD. Patent SVG to diagonal. Patent native RCA which feeds collaterals to the circumflex system. SVG to OM/PDA jump graft occluded proximally. The second half of this graft is open. Therefore, flow is going from the native RCA, and then retrograde into the vein graft which feeds the obtuse  marginal. Patent stents in the RCA and posterior lateral artery. Culprit lesion for her symptoms was the new lesion in the ostium of the RCA. This was successfully treated with a 3.5 x 16 Synergy drug-eluting stent, postdilated to greater than 4 mm in diameter. Ost RCA lesion, 75% stenosed. There is a 0% residual stenosis post intervention. The lesion was not previously treated.    Neuro/Psych negative neurological ROS  negative psych ROS   GI/Hepatic negative GI ROS, Neg liver ROS,   Endo/Other  diabetes, Oral Hypoglycemic Agents  Renal/GU Renal InsufficiencyRenal disease  negative genitourinary   Musculoskeletal  (+) Arthritis ,   Abdominal   Peds  Hematology  (+) Blood dyscrasia (Hgb 10.9, on coumadin), anemia ,   Anesthesia Other Findings   Reproductive/Obstetrics                            Anesthesia Physical Anesthesia Plan  ASA: III  Anesthesia Plan: General   Post-op Pain Management:    Induction: Intravenous  PONV Risk Score and Plan: 3 and Propofol infusion and Treatment may vary due to age or medical condition  Airway Management Planned: Natural Airway  Additional Equipment:   Intra-op Plan:   Post-operative Plan:   Informed Consent: I have reviewed the patients History and Physical, chart, labs and discussed the procedure including the risks, benefits and alternatives for the proposed anesthesia with the patient or authorized representative who has indicated his/her understanding and acceptance.     Dental advisory given  Plan Discussed with: CRNA  Anesthesia Plan Comments:  Anesthesia Quick Evaluation  

## 2019-08-15 NOTE — CV Procedure (Signed)
    Cardioversion Note  LOYCE FLAMING [id number] 01-Jan-1933  Procedure: DC Cardioversion Indications: atrial fib   Procedure Details Consent: Obtained Time Out: Verified patient identification, verified procedure, site/side was marked, verified correct patient position, special equipment/implants available, Radiology Safety Procedures followed,  medications/allergies/relevent history reviewed, required imaging and test results available.  Performed  The patient has been on adequate anticoagulation.  The patient received IV Lidocaine 40 mg IV followed by Propofol 30 mg IV  for sedation.  Synchronous cardioversion was performed at 120  joules.  The cardioversion was successful.  ( ECG is pending )     Complications: No apparent complications Patient did tolerate procedure well.   Thayer Headings, Brooke Bonito., MD, [facility] 08/15/2019, 10:53 AM

## 2019-08-15 NOTE — Anesthesia Postprocedure Evaluation (Signed)
Anesthesia Post Note  Patient: [name]  Procedure(s) Performed: CARDIOVERSION (N/A )     Patient location during evaluation: Endoscopy Anesthesia Type: General Level of consciousness: awake and alert Pain management: pain level controlled Vital Signs Assessment: post-procedure vital signs reviewed and stable Respiratory status: spontaneous breathing, nonlabored ventilation, respiratory function stable and patient connected to nasal cannula oxygen Cardiovascular status: blood pressure returned to baseline and stable Postop Assessment: no apparent nausea or vomiting Anesthetic complications: no    Last Vitals:  Vitals:   08/15/19 1055 08/15/19 1110  BP: (!) 160/63 (!) 165/74  Pulse: 77 76  Resp: (!) 22 (!) 25  Temp: (!) 36.3 C   SpO2: 100% 100%    Last Pain:  Vitals:   08/15/19 1110  TempSrc:   PainSc: 0-No pain                 Ronson Hagins L Arlethia Basso

## 2019-08-16 ENCOUNTER — Other Ambulatory Visit: Payer: Self-pay

## 2019-08-16 DIAGNOSIS — R7989 Other specified abnormal findings of blood chemistry: Secondary | ICD-10-CM

## 2019-08-22 ENCOUNTER — Other Ambulatory Visit: Payer: Self-pay

## 2019-08-22 ENCOUNTER — Ambulatory Visit (INDEPENDENT_AMBULATORY_CARE_PROVIDER_SITE_OTHER): Payer: Medicare Other | Admitting: *Deleted

## 2019-08-22 ENCOUNTER — Other Ambulatory Visit: Payer: Medicare Other | Admitting: *Deleted

## 2019-08-22 DIAGNOSIS — I359 Nonrheumatic aortic valve disorder, unspecified: Secondary | ICD-10-CM | POA: Diagnosis not present

## 2019-08-22 DIAGNOSIS — R7989 Other specified abnormal findings of blood chemistry: Secondary | ICD-10-CM

## 2019-08-22 DIAGNOSIS — Z5181 Encounter for therapeutic drug level monitoring: Secondary | ICD-10-CM

## 2019-08-22 LAB — POCT INR: INR: 3.8 — AB (ref 2.0–3.0)

## 2019-08-22 NOTE — Patient Instructions (Signed)
Description   Hold today's dose and take 1/2 tablet tomorrow then continue taking same dosage 1 tablet every day except 1/2 tablet on Sundays, Wednesdays, and Fridays. Recheck INR in 1 week. Call our office if you have any problems or concerns [phone].

## 2019-08-23 ENCOUNTER — Other Ambulatory Visit: Payer: Self-pay

## 2019-08-23 DIAGNOSIS — I5032 Chronic diastolic (congestive) heart failure: Secondary | ICD-10-CM

## 2019-08-23 LAB — BASIC METABOLIC PANEL
BUN/Creatinine Ratio: 23 (ref 12–28)
BUN: 38 mg/dL — ABNORMAL HIGH (ref 8–27)
CO2: 24 mmol/L (ref 20–29)
Calcium: 9 mg/dL (ref 8.7–10.3)
Chloride: 105 mmol/L (ref 96–106)
Creatinine, Ser: 1.67 mg/dL — ABNORMAL HIGH (ref 0.57–1.00)
GFR calc Af Amer: 32 mL/min/{1.73_m2} — ABNORMAL LOW (ref >59)
GFR calc non Af Amer: 28 mL/min/{1.73_m2} — ABNORMAL LOW (ref >59)
Glucose: 122 mg/dL — ABNORMAL HIGH (ref 65–99)
Potassium: 4.6 mmol/L (ref 3.5–5.2)
Sodium: 139 mmol/L (ref 134–144)

## 2019-08-23 MED ORDER — FUROSEMIDE 20 MG PO TABS: 20.0000 mg | ORAL_TABLET | Freq: Every day | ORAL | 3 refills | Status: DC

## 2019-08-30 ENCOUNTER — Ambulatory Visit (INDEPENDENT_AMBULATORY_CARE_PROVIDER_SITE_OTHER): Payer: Medicare Other | Admitting: Pharmacist

## 2019-08-30 ENCOUNTER — Other Ambulatory Visit: Payer: Self-pay

## 2019-08-30 DIAGNOSIS — Z5181 Encounter for therapeutic drug level monitoring: Secondary | ICD-10-CM | POA: Diagnosis not present

## 2019-08-30 DIAGNOSIS — I359 Nonrheumatic aortic valve disorder, unspecified: Secondary | ICD-10-CM | POA: Diagnosis not present

## 2019-08-30 LAB — POCT INR: INR: 1.9 — AB (ref 2.0–3.0)

## 2019-08-30 NOTE — Patient Instructions (Signed)
Description   Take 1 full tablet tomorrow, then continue taking same dosage 1 tablet every day except 1/2 tablet on Sundays, Wednesdays, and Fridays. Recheck INR in 3 weeks. Call our office if you have any problems or concerns [phone].

## 2019-08-31 DIAGNOSIS — H353112 Nonexudative age-related macular degeneration, right eye, intermediate dry stage: Secondary | ICD-10-CM | POA: Diagnosis not present

## 2019-08-31 DIAGNOSIS — H35371 Puckering of macula, right eye: Secondary | ICD-10-CM | POA: Diagnosis not present

## 2019-08-31 DIAGNOSIS — H353221 Exudative age-related macular degeneration, left eye, with active choroidal neovascularization: Secondary | ICD-10-CM | POA: Diagnosis not present

## 2019-08-31 DIAGNOSIS — E113293 Type 2 diabetes mellitus with mild nonproliferative diabetic retinopathy without macular edema, bilateral: Secondary | ICD-10-CM | POA: Diagnosis not present

## 2019-09-06 ENCOUNTER — Other Ambulatory Visit: Payer: Self-pay | Admitting: Physician Assistant

## 2019-09-06 ENCOUNTER — Other Ambulatory Visit: Payer: Self-pay

## 2019-09-06 ENCOUNTER — Ambulatory Visit (INDEPENDENT_AMBULATORY_CARE_PROVIDER_SITE_OTHER): Payer: Medicare Other | Admitting: Physician Assistant

## 2019-09-06 ENCOUNTER — Encounter: Payer: Self-pay | Admitting: Physician Assistant

## 2019-09-06 VITALS — BP 132/60 | HR 63 | Ht 60.0 in | Wt 140.8 lb

## 2019-09-06 DIAGNOSIS — I6523 Occlusion and stenosis of bilateral carotid arteries: Secondary | ICD-10-CM

## 2019-09-06 DIAGNOSIS — I5032 Chronic diastolic (congestive) heart failure: Secondary | ICD-10-CM | POA: Diagnosis not present

## 2019-09-06 DIAGNOSIS — E785 Hyperlipidemia, unspecified: Secondary | ICD-10-CM

## 2019-09-06 DIAGNOSIS — I10 Essential (primary) hypertension: Secondary | ICD-10-CM

## 2019-09-06 DIAGNOSIS — I480 Paroxysmal atrial fibrillation: Secondary | ICD-10-CM

## 2019-09-06 DIAGNOSIS — R0609 Other forms of dyspnea: Secondary | ICD-10-CM | POA: Diagnosis not present

## 2019-09-06 NOTE — Patient Instructions (Addendum)
Medication Instructions:  Your physician recommends that you continue on your current medications as directed. Please refer to the Current Medication list given to you today.  If you need a refill on your cardiac medications before your next appointment, please call your pharmacy.   Lab work: None Ordered  If you have labs (blood work) drawn today and your tests are completely normal, you will receive your results only by: Marland Kitchen MyChart Message (if you have MyChart) OR . A paper copy in the mail If you have any lab test that is abnormal or we need to change your treatment, we will call you to review the results.  Testing/Procedures: None ordered  Follow-Up: . Follow up with Dr. Irish Lack on 11/14/19 at 9:20 AM  Any Other Special Instructions Will Be Listed Below (If Applicable).

## 2019-09-06 NOTE — Progress Notes (Signed)
Cardiology Office Note    Date:  09/06/2019   ID:  [name], [name] June 10, 1933, MRN [id number]  PCP:  Gaynelle Arabian, MD  Cardiologist: Larae Grooms, MD EPS: None  Chief Complaint  Patient presents with  . Hospitalization Follow-up    History of Present Illness:  [name] is a 85 y.o. female with a hx of CAD and aortic valve disease status post CABG and mechanical AVR cardiac cath 03/2015, subsequent PCI to the RCA with BMS,LIMA-LAD was patent, SVG-diagonal patent, SVG-OM/PDA occluded. The second half of the OM/PDA graft was open. RCA stents were patent. She had an ostial RCA lesion of 75% which was treated with a Synergy DES. Because she needs Coumadin for her mechanical AVR, she was continued on Coumadin plus Plavix only (no aspirin). Also has HTN, HL, diabetes, prior GI bleed, CKD, diastolic CHF, PAF.    Patient had sepsis due to gram-negative bacteremia E. coli related to UTI 11/2018.  Echo with normal LV function moderate LVH normally functioning mechanical aortic valve possible mitral valve with small mobile vegetation in the anterior leaflet and small ASD suggested by Doppler TEE was negative for vegetations.  Patient underwent successful DCCV 08/15/19. Patient isn't as short of breath as before. Short of breath if moves too fast or bends over.   Past Medical History:  Diagnosis Date  . Anemia   . Anginal pain ([place])   . Arteriovenous malformation of gastrointestinal tract   . Arthritis    "fingers mostly" (04/12/2015)  . Carotid artery disease ([place])    Carotid US 3/17:  42-68% RICA; 34-19% LICA; Elevated bilateral subclavian artery velocities >>f/u 1 year. // Carotid US 4/18: R 40-59; L 1-39 >> FU 1 year // Carotid US 10/2018: R 40-59; L 1-39, L subclavian stenosis   . CHF (congestive heart failure) ([place])   . Chronic kidney disease (CKD), stage III (moderate)   . Chronic lower GI bleeding    "today; last time was ~ 8 yr ago; used to have them often before that  too" (02/11/2013)  . Coronary artery disease   . DJD (degenerative joint disease)   . Dysrhythmia   . Heart murmur   . History of blood transfusion    "a few times over the years; usually related to my Coumadin" (04/12/2015  . History of gout   . Hyperlipemia   . Hypertension   . Macula lutea degeneration   . Old MI (myocardial infarction)    "a coulple /dr in 02/2008; I never even knew I'd had them" (04/12/2015)  . Type II diabetes mellitus ([place])     Past Surgical History:  Procedure Laterality Date  . AORTIC VALVE REPLACEMENT  1993  . APPENDECTOMY  1953  . CARDIAC CATHETERIZATION  ~1990  . CARDIAC CATHETERIZATION  04/12/2015   Procedure: Coronary Stent Intervention;  Surgeon: Jettie Booze, MD;    SYNERGY DES 3.5X16 to the ostial RCA   . CARDIAC CATHETERIZATION  04/12/2015   Procedure: Coronary/Graft Angiography;  Surgeon: Eloy End, MD; LAD & CFX 100%, patent LIMA-LAD, SVG-D1; SVG-OM-PDA first limb 100%, 2nd limb patent; oRCA 75%>0 w/ stent  . [place]   St. Jude/notes 10/29/2003 (02/11/2013)  . CARDIOVERSION N/A 08/15/2019   Procedure: CARDIOVERSION;  Surgeon: Acie Fredrickson Wonda Cheng, MD;  Location: [facility] ENDOSCOPY;  Service: Cardiovascular;  Laterality: N/A;  . COLONOSCOPY N/A 02/13/2013   Procedure: COLONOSCOPY;  Surgeon: Lafayette Dragon, MD;  Location: [facility] ENDOSCOPY;  Service: Endoscopy;  Laterality: N/A;  . CORONARY ANGIOPLASTY WITH STENT PLACEMENT  2009+   "3 at least; put in 1 stent at a time" (02/11/2013)  . CORONARY ARTERY BYPASS GRAFT  1990   LIMA-LAD, SVG-OM-PDA, SVG-D1  . DILATION AND CURETTAGE OF UTERUS  1960's   'after a miscarriage" (02/11/2013)  . ENTEROSCOPY N/A 02/13/2013   Procedure: ENTEROSCOPY;  Surgeon: Lafayette Dragon, MD;  Location: [facility] ENDOSCOPY;  Service: Endoscopy;  Laterality: N/A;  . EXPLORATORY LAPAROTOMY  02/24/2008   which revealed a retroperitoneal hematoma and bleeding from the right external iliac artery/notes 03/02/2008 (02/11/2013)    . FLEXIBLE SIGMOIDOSCOPY N/A 04/21/2015   Procedure: FLEXIBLE SIGMOIDOSCOPY;  Surgeon: Ladene Artist, MD;  Location: [facility] ENDOSCOPY;  Service: Endoscopy;  Laterality: N/A;  . TEE WITHOUT CARDIOVERSION N/A 12/01/2018   Procedure: TRANSESOPHAGEAL ECHOCARDIOGRAM (TEE);  Surgeon: Jerline Pain, MD;  Location: [facility] ENDOSCOPY;  Service: Cardiovascular;  Laterality: N/A;    Current Medications: Current Meds  Medication Sig  . amiodarone (PACERONE) 200 MG tablet Take 2 tablets (400 mg) by mouth twice a day for 8 days, then take 1 tablet (200 mg) by mouth once a day (Patient taking differently: Take 200 mg by mouth daily. )  . aspirin EC 81 MG tablet Take 1 tablet (81 mg total) by mouth daily.  Marland Kitchen atorvastatin (LIPITOR) 80 MG tablet Take 1 tablet by mouth once daily (Patient taking differently: Take 80 mg by mouth at bedtime. )  . bevacizumab (AVASTIN) 1.25 mg/0.1 mL SOLN Place 1.25 mg into the left eye every 8 (eight) weeks.  Marland Kitchen ezetimibe (ZETIA) 10 MG tablet TAKE 1 TABLET BY MOUTH ONCE DAILY PLEASE  MAKE  OVERDUE  APPOINTMENT  WITH  DR  Irish Lack  BEFORE  ANYMORE  REFILLS (Patient taking differently: Take 10 mg by mouth daily. )  . FeFum-FePo-FA-B Cmp-C-Zn-Mn-Cu (SE-TAN PLUS) 162-115.2-1 MG CAPS Take 1 tablet by mouth 2 (two) times daily.  . folic acid (FOLVITE) 546 MCG tablet Take 400 mcg by mouth every evening.   . furosemide (LASIX) 20 MG tablet Take 1 tablet (20 mg total) by mouth daily. You may take an extra 20 mg as needed for 2-3 lb weight gain overnight  . isosorbide mononitrate (IMDUR) 60 MG 24 hr tablet Take 1 tablet (60 mg total) by mouth daily. (Patient taking differently: Take 120 mg by mouth daily. )  . JANUVIA 50 MG tablet Take 50 mg by mouth daily.   Marland Kitchen losartan (COZAAR) 50 MG tablet Take 50 mg by mouth daily.  . metoprolol tartrate (LOPRESSOR) 25 MG tablet Take 1 tablet (25 mg total) by mouth daily.  . Multiple Vitamin (MULTIVITAMIN WITH MINERALS) TABS Take 1 tablet by mouth daily.  .  Multiple Vitamins-Minerals (PRESERVISION AREDS 2 PO) Take 1 tablet by mouth 2 (two) times daily.   . nitroGLYCERIN (NITROSTAT) 0.4 MG SL tablet Place 1 tablet (0.4 mg total) under the tongue every 5 (five) minutes as needed for chest pain. Please make appt for January. 1st attempt  . Omega-3 Fatty Acids 1200 MG CAPS Take 600 mg by mouth 2 (two) times daily.   Marland Kitchen warfarin (JANTOVEN) 5 MG tablet Take 5 mg by mouth See admin instructions. Take 2.5 mg in Mon. Wed and Friday Take 5 mg all the other days in the evening     Allergies:   Darvon [propoxyphene], Lisinopril, Septra [sulfamethoxazole-trimethoprim], Warfarin and related, and Penicillins   Social History   Socioeconomic History  . Marital status: Married    Spouse  name: Not on file  . Number of children: Not on file  . Years of education: Not on file  . Highest education level: Not on file  Occupational History  . Not on file  Tobacco Use  . Smoking status: Never Smoker  . Smokeless tobacco: Never Used  Substance and Sexual Activity  . Alcohol use: No  . Drug use: No  . Sexual activity: Yes  Other Topics Concern  . Not on file  Social History Narrative  . Not on file   Social Determinants of Health   Financial Resource Strain:   . Difficulty of Paying Living Expenses: Not on file  Food Insecurity:   . Worried About Charity fundraiser in the Last Year: Not on file  . Ran Out of Food in the Last Year: Not on file  Transportation Needs:   . Lack of Transportation (Medical): Not on file  . Lack of Transportation (Non-Medical): Not on file  Physical Activity:   . Days of Exercise per Week: Not on file  . Minutes of Exercise per Session: Not on file  Stress:   . Feeling of Stress : Not on file  Social Connections:   . Frequency of Communication with Friends and Family: Not on file  . Frequency of Social Gatherings with Friends and Family: Not on file  . Attends Religious Services: Not on file  . Active Member of Clubs  or Organizations: Not on file  . Attends Archivist Meetings: Not on file  . Marital Status: Not on file     Family History:  The patient's   family history includes Heart disease in her father and mother.   ROS:   Please see the history of present illness.    ROS All other systems reviewed and are negative.   PHYSICAL EXAM:   VS:  BP 132/60   Pulse 63   Ht 5' (1.524 m)   Wt 140 lb 12.8 oz (63.9 kg)   SpO2 99%   BMI 27.50 kg/m   Physical Exam  GEN: Well nourished, well developed, in no acute distress   Neck: no JVD, carotid bruits, or masses Cardiac:RRR; 2/6 systolic murmur Respiratory:  clear to auscultation bilaterally, normal work of breathing GI: soft, nontender, nondistended, + BS YTK:ZSWF ankle edema right >left otherwise without cyanosis, clubbing,   Good distal pulses bilaterally Neuro:  Alert and Oriented x 3 Psych: euthymic mood, full affect  Wt Readings from Last 3 Encounters:  09/06/19 140 lb 12.8 oz (63.9 kg)  08/09/19 135 lb (61.2 kg)  07/19/19 143 lb (64.9 kg)      Studies/Labs Reviewed:   EKG:  EKG is  ordered today.  The ekg ordered today demonstrates normal sinus rhythm with intraventricular conduction delay and poor R wave progression anteriorly.  Recent Labs: 10/01/2018: B Natriuretic Peptide 150.0 06/29/2019: ALT 24; TSH 4.260 08/12/2019: Hemoglobin 10.9; Platelets 228 08/22/2019: BUN 38; Creatinine, Ser 1.67; Potassium 4.6; Sodium 139   Lipid Panel    Component Value Date/Time   CHOL 117 06/29/2019 0953   CHOL 172 05/01/2015 1234   TRIG 153 (H) 06/29/2019 0953   TRIG 248 (H) 05/01/2015 1234   HDL 51 06/29/2019 0953   HDL 46 05/01/2015 1234   CHOLHDL 2.3 06/29/2019 0953   LDLCALC 40 06/29/2019 0953   LDLCALC 76 05/01/2015 1234    Additional studies/ records that were reviewed today include:  TEE 12/01/2018  IMPRESSIONS      1. The left ventricle  has hyperdynamic systolic function, with an ejection fraction of >65%. The  cavity size was normal.  2. The right ventricle has normal systolc function. The cavity was normal. There is no increase in right ventricular wall thickness.  3. Mild mitral valve prolapse.  4. Mitral valve regurgitation is mild to moderate by color flow Doppler.  5. Tricuspid valve regurgitation is moderate.  6. Aortic valve regurgitation mild perivalvular leak.  7. Pulmonic valve regurgitation was not assessed by color flow Doppler.  8. There is evidence of plaque in the descending aorta.  9. No vegetations, no endocarditis. 10. - TAVR: TAVR valve with mild perivalvular leak. No vegetations.   FINDINGS  Left Ventricle: The left ventricle has hyperdynamic systolic function, with an ejection fraction of >65%. The cavity size was normal. There is no increase in left ventricular wall thickness. Right Ventricle: The right ventricle has normal systolic function. The cavity was normal. There is no increase in right ventricular wall thickness. Left Atrium: Left atrial size was normal in size. Right Atrium: Right atrial size was normal in size. Interatrial Septum: No atrial level shunt detected by color flow Doppler. Pericardium: There is no evidence of pericardial effusion. Mitral Valve: Mitral valve regurgitation is mild to moderate by color flow Doppler. There is mild holosystolic prolapse of of the anterior mitral leaflet of the mitral valve. Tricuspid Valve: Tricuspid valve regurgitation is moderate by color flow Doppler. Aortic Valve: Aortic valve regurgitation mild perivalvular leak. - TAVR: TAVR valve with mild perivalvular leak. No vegetations. Pulmonic Valve: Pulmonic valve regurgitation was not assessed by color flow Doppler. Aorta: There is evidence of plaque in the descending aorta. Venous: The inferior vena cava is normal in size with greater than 50% respiratory variability.   LV Wall Scoring:   TRICUSPID VALVE TR Peak grad:   24.4 mmHg TR Vmax:        247.00 cm/s     Candee Furbish MD Electronically signed by Candee Furbish MD Signature Date/Time: 12/01/2018/5:26:21 PM   Echo 11/30/2018  IMPRESSIONS      1. The left ventricle has normal systolic function with an ejection fraction of 60-65%. The cavity size was normal. There is moderately increased left ventricular wall thickness. Left ventricular diastolic function could not be evaluated secondary to  atrial fibrillation. No evidence of left ventricular regional wall motion abnormalities.  2. Left atrial size was mildly dilated.  3. The mitral valve is degenerative. Moderate thickening of the mitral valve leaflet. Moderate calcification of the mitral valve leaflet. There is moderate mitral annular calcification present. A cannot exclude vegetation vegetation is seen on the  mitral leaflet.  4. Aortic valve regurgitation is trivial by color flow Doppler. mild stenosis of the aortic valve.  5. The ascending aorta and aortic root are normal in size and structure.  6. When compared to the prior study: 7/9/201: LVEF 60-65%, normal mechanical AOV prosthesis, mean gradient 28 mmHg, MAC with mild MR.  7. Cannot exclude small ASD.   SUMMARY   LVEF 60-65%, moderate LVH, normal wall motion, mechanical aortic valve with trivial AI, heavy MAC with mild MR - cannot exclude possible small mobile vegetation on the anterior mitral leaflet, a small ASD is suggested by color doppler. Recommend further evalution with TEE/bubble study, especially for better evaluation of the valves with regards to endocarditis.  FINDINGS  Left Ventricle: The left ventricle has normal systolic function, with an ejection fraction of 60-65%. The cavity size was normal. There is moderately increased left ventricular  wall thickness. Left ventricular diastolic function could not be evaluated  secondary to atrial fibrillation. No evidence of left ventricular regional wall motion abnormalities.. Right Ventricle: The right ventricle has low normal systolic  function. The cavity was normal. There is no increase in right ventricular wall thickness. Left Atrium: left atrial size was mildly dilated Right Atrium: right atrial size was normal in size. Right atrial pressure is estimated at 3 mmHg. Interatrial Septum: Cannot exclude small ASD. Pericardium: There is no evidence of pericardial effusion. Mitral Valve: The mitral valve is degenerative in appearance. Moderate thickening of the mitral valve leaflet. Moderate calcification of the mitral valve leaflet. There is moderate mitral annular calcification present. Mitral valve regurgitation is mild  by color flow Doppler. A cannot exclude vegetation vegetation is seen on the mitral leaflet. Tricuspid Valve: The tricuspid valve is normal in structure. Tricuspid valve regurgitation is mild by color flow Doppler. Aortic Valve: The aortic valve has been repaired/replaced Aortic valve regurgitation is trivial by color flow Doppler. There is mild stenosis of the aortic valve, with a calculated valve area of 0.66 cm. Pulmonic Valve: The pulmonic valve was not well visualized. Pulmonic valve regurgitation is not visualized by color flow Doppler. Aorta: The ascending aorta and aortic root are normal in size and structure. Venous: The inferior vena cava measures 1.79 cm, is normal in size with greater than 50% respiratory variability. Compared to previous exam: 7/9/201: LVEF 60-65%, normal mechanical AOV prosthesis, mean gradient 28 mmHg, MAC with mild MR.             ASSESSMENT:    1. Paroxysmal atrial fibrillation (HCC)   2. Chronic dyspnea   3. Essential hypertension   4. Chronic diastolic CHF (congestive heart failure) ([place])   5. Hyperlipidemia, unspecified hyperlipidemia type      PLAN:  In order of problems listed above:  Atrial fibrillation on Coumadin and amiodarone status post DCCV 08/15/2019, maintaining NSR, continue Amio  Chronic dyspnea on exertion felt related to A. Fib-improved with  cardioversion  Essential hypertension BP controlled  Chronic diastolic CHF compensated  Hyperlipidemia LDL 54  Medication Adjustments/Labs and Tests Ordered: Current medicines are reviewed at length with the patient today.  Concerns regarding medicines are outlined above.  Medication changes, Labs and Tests ordered today are listed in the Patient Instructions below. Patient Instructions  Medication Instructions:  Your physician recommends that you continue on your current medications as directed. Please refer to the Current Medication list given to you today.  If you need a refill on your cardiac medications before your next appointment, please call your pharmacy.   Lab work: None Ordered  If you have labs (blood work) drawn today and your tests are completely normal, you will receive your results only by: Marland Kitchen MyChart Message (if you have MyChart) OR . A paper copy in the mail If you have any lab test that is abnormal or we need to change your treatment, we will call you to review the results.  Testing/Procedures: None ordered  Follow-Up: . Follow up with Dr. Irish Lack on 11/14/19 at 9:20 AM  Any Other Special Instructions Will Be Listed Below (If Applicable).       Sumner Boast, PA-C  09/06/2019 2:37 PM    [facility] [place], [place],   [address] Phone: [phone]; Fax: [phone]

## 2019-09-17 ENCOUNTER — Other Ambulatory Visit: Payer: Self-pay | Admitting: Interventional Cardiology

## 2019-09-20 ENCOUNTER — Ambulatory Visit (INDEPENDENT_AMBULATORY_CARE_PROVIDER_SITE_OTHER): Payer: Medicare Other | Admitting: Pharmacist

## 2019-09-20 ENCOUNTER — Other Ambulatory Visit: Payer: Self-pay

## 2019-09-20 DIAGNOSIS — Z5181 Encounter for therapeutic drug level monitoring: Secondary | ICD-10-CM

## 2019-09-20 DIAGNOSIS — I359 Nonrheumatic aortic valve disorder, unspecified: Secondary | ICD-10-CM | POA: Diagnosis not present

## 2019-09-20 LAB — POCT INR: INR: 3.2 — AB (ref 2.0–3.0)

## 2019-09-20 NOTE — Patient Instructions (Signed)
Description   Skip your Coumadin today, then continue taking same dosage 1 tablet every day except 1/2 tablet on Sundays, Wednesdays, and Fridays. Recheck INR in 3 weeks. Call our office if you have any problems or concerns [phone].

## 2019-09-27 ENCOUNTER — Other Ambulatory Visit: Payer: Self-pay

## 2019-09-27 ENCOUNTER — Other Ambulatory Visit: Payer: Medicare Other

## 2019-09-27 DIAGNOSIS — I5032 Chronic diastolic (congestive) heart failure: Secondary | ICD-10-CM

## 2019-09-28 LAB — BASIC METABOLIC PANEL
BUN/Creatinine Ratio: 25 (ref 12–28)
BUN: 38 mg/dL — ABNORMAL HIGH (ref 8–27)
CO2: 24 mmol/L (ref 20–29)
Calcium: 9.4 mg/dL (ref 8.7–10.3)
Chloride: 104 mmol/L (ref 96–106)
Creatinine, Ser: 1.5 mg/dL — ABNORMAL HIGH (ref 0.57–1.00)
GFR calc Af Amer: 37 mL/min/{1.73_m2} — ABNORMAL LOW (ref >59)
GFR calc non Af Amer: 32 mL/min/{1.73_m2} — ABNORMAL LOW (ref >59)
Glucose: 102 mg/dL — ABNORMAL HIGH (ref 65–99)
Potassium: 4.9 mmol/L (ref 3.5–5.2)
Sodium: 140 mmol/L (ref 134–144)

## 2019-10-10 DIAGNOSIS — E1169 Type 2 diabetes mellitus with other specified complication: Secondary | ICD-10-CM | POA: Diagnosis not present

## 2019-10-11 ENCOUNTER — Other Ambulatory Visit: Payer: Self-pay

## 2019-10-11 ENCOUNTER — Ambulatory Visit (INDEPENDENT_AMBULATORY_CARE_PROVIDER_SITE_OTHER): Payer: Medicare Other | Admitting: *Deleted

## 2019-10-11 DIAGNOSIS — I359 Nonrheumatic aortic valve disorder, unspecified: Secondary | ICD-10-CM

## 2019-10-11 DIAGNOSIS — Z5181 Encounter for therapeutic drug level monitoring: Secondary | ICD-10-CM | POA: Diagnosis not present

## 2019-10-11 LAB — POCT INR: INR: 2.7 (ref 2.0–3.0)

## 2019-10-11 NOTE — Patient Instructions (Signed)
Description   Take 1/2 a tablet today and then continue taking same dosage 1 tablet every day except 1/2 tablet on Sundays, Wednesdays, and Fridays. Recheck INR in 3 weeks. Call our office if you have any problems or concerns [phone].

## 2019-10-25 ENCOUNTER — Other Ambulatory Visit: Payer: Self-pay | Admitting: Interventional Cardiology

## 2019-10-26 DIAGNOSIS — H353112 Nonexudative age-related macular degeneration, right eye, intermediate dry stage: Secondary | ICD-10-CM | POA: Diagnosis not present

## 2019-10-26 DIAGNOSIS — H353221 Exudative age-related macular degeneration, left eye, with active choroidal neovascularization: Secondary | ICD-10-CM | POA: Diagnosis not present

## 2019-10-26 DIAGNOSIS — H35371 Puckering of macula, right eye: Secondary | ICD-10-CM | POA: Diagnosis not present

## 2019-10-26 DIAGNOSIS — E113293 Type 2 diabetes mellitus with mild nonproliferative diabetic retinopathy without macular edema, bilateral: Secondary | ICD-10-CM | POA: Diagnosis not present

## 2019-11-01 ENCOUNTER — Other Ambulatory Visit: Payer: Self-pay

## 2019-11-01 ENCOUNTER — Ambulatory Visit (INDEPENDENT_AMBULATORY_CARE_PROVIDER_SITE_OTHER): Payer: Medicare Other | Admitting: *Deleted

## 2019-11-01 DIAGNOSIS — Z5181 Encounter for therapeutic drug level monitoring: Secondary | ICD-10-CM

## 2019-11-01 DIAGNOSIS — I359 Nonrheumatic aortic valve disorder, unspecified: Secondary | ICD-10-CM

## 2019-11-01 LAB — POCT INR: INR: 4.7 — AB (ref 2.0–3.0)

## 2019-11-01 NOTE — Patient Instructions (Signed)
Description   Hold warfarin today and tomorrow, on thursday take 1/2 a tablet then continue taking same dosage 1 tablet every day except 1/2 tablet on Sundays, Wednesdays, and Fridays. Recheck INR in 1 week. Call our office if you have any problems or concerns [phone].

## 2019-11-03 ENCOUNTER — Emergency Department (HOSPITAL_COMMUNITY): Payer: Medicare Other

## 2019-11-03 ENCOUNTER — Other Ambulatory Visit: Payer: Self-pay | Admitting: Physician Assistant

## 2019-11-03 ENCOUNTER — Encounter (HOSPITAL_COMMUNITY): Payer: Self-pay

## 2019-11-03 ENCOUNTER — Other Ambulatory Visit: Payer: Self-pay

## 2019-11-03 ENCOUNTER — Inpatient Hospital Stay (HOSPITAL_COMMUNITY)
Admission: EM | Admit: 2019-11-03 | Discharge: 2019-11-09 | DRG: 813 | Disposition: A | Discharge status: Home / Self Care | Payer: Medicare Other | Attending: Internal Medicine | Admitting: Internal Medicine

## 2019-11-03 DIAGNOSIS — R0602 Shortness of breath: Secondary | ICD-10-CM | POA: Diagnosis not present

## 2019-11-03 DIAGNOSIS — I252 Old myocardial infarction: Secondary | ICD-10-CM

## 2019-11-03 DIAGNOSIS — T45515A Adverse effect of anticoagulants, initial encounter: Secondary | ICD-10-CM | POA: Diagnosis present

## 2019-11-03 DIAGNOSIS — D6832 Hemorrhagic disorder due to extrinsic circulating anticoagulants: Principal | ICD-10-CM | POA: Diagnosis present

## 2019-11-03 DIAGNOSIS — K5521 Angiodysplasia of colon with hemorrhage: Secondary | ICD-10-CM | POA: Diagnosis not present

## 2019-11-03 DIAGNOSIS — D649 Anemia, unspecified: Secondary | ICD-10-CM | POA: Diagnosis present

## 2019-11-03 DIAGNOSIS — R531 Weakness: Secondary | ICD-10-CM | POA: Diagnosis not present

## 2019-11-03 DIAGNOSIS — I5032 Chronic diastolic (congestive) heart failure: Secondary | ICD-10-CM | POA: Diagnosis present

## 2019-11-03 DIAGNOSIS — H3530 Unspecified macular degeneration: Secondary | ICD-10-CM | POA: Diagnosis present

## 2019-11-03 DIAGNOSIS — K254 Chronic or unspecified gastric ulcer with hemorrhage: Secondary | ICD-10-CM | POA: Diagnosis present

## 2019-11-03 DIAGNOSIS — E785 Hyperlipidemia, unspecified: Secondary | ICD-10-CM | POA: Diagnosis present

## 2019-11-03 DIAGNOSIS — Z7982 Long term (current) use of aspirin: Secondary | ICD-10-CM

## 2019-11-03 DIAGNOSIS — Z8249 Family history of ischemic heart disease and other diseases of the circulatory system: Secondary | ICD-10-CM

## 2019-11-03 DIAGNOSIS — K922 Gastrointestinal hemorrhage, unspecified: Secondary | ICD-10-CM | POA: Diagnosis not present

## 2019-11-03 DIAGNOSIS — Z888 Allergy status to other drugs, medicaments and biological substances status: Secondary | ICD-10-CM

## 2019-11-03 DIAGNOSIS — M19041 Primary osteoarthritis, right hand: Secondary | ICD-10-CM | POA: Diagnosis present

## 2019-11-03 DIAGNOSIS — I480 Paroxysmal atrial fibrillation: Secondary | ICD-10-CM | POA: Diagnosis present

## 2019-11-03 DIAGNOSIS — I130 Hypertensive heart and chronic kidney disease with heart failure and stage 1 through stage 4 chronic kidney disease, or unspecified chronic kidney disease: Secondary | ICD-10-CM | POA: Diagnosis present

## 2019-11-03 DIAGNOSIS — K921 Melena: Secondary | ICD-10-CM | POA: Diagnosis not present

## 2019-11-03 DIAGNOSIS — Z8719 Personal history of other diseases of the digestive system: Secondary | ICD-10-CM | POA: Diagnosis present

## 2019-11-03 DIAGNOSIS — Z951 Presence of aortocoronary bypass graft: Secondary | ICD-10-CM

## 2019-11-03 DIAGNOSIS — Z880 Allergy status to penicillin: Secondary | ICD-10-CM

## 2019-11-03 DIAGNOSIS — E1122 Type 2 diabetes mellitus with diabetic chronic kidney disease: Secondary | ICD-10-CM | POA: Diagnosis present

## 2019-11-03 DIAGNOSIS — M19042 Primary osteoarthritis, left hand: Secondary | ICD-10-CM | POA: Diagnosis present

## 2019-11-03 DIAGNOSIS — M109 Gout, unspecified: Secondary | ICD-10-CM | POA: Diagnosis present

## 2019-11-03 DIAGNOSIS — N179 Acute kidney failure, unspecified: Secondary | ICD-10-CM | POA: Diagnosis not present

## 2019-11-03 DIAGNOSIS — N1832 Chronic kidney disease, stage 3b: Secondary | ICD-10-CM | POA: Diagnosis present

## 2019-11-03 DIAGNOSIS — I2510 Atherosclerotic heart disease of native coronary artery without angina pectoris: Secondary | ICD-10-CM | POA: Diagnosis present

## 2019-11-03 DIAGNOSIS — I482 Chronic atrial fibrillation, unspecified: Secondary | ICD-10-CM | POA: Diagnosis present

## 2019-11-03 DIAGNOSIS — Z952 Presence of prosthetic heart valve: Secondary | ICD-10-CM

## 2019-11-03 DIAGNOSIS — Z7901 Long term (current) use of anticoagulants: Secondary | ICD-10-CM

## 2019-11-03 DIAGNOSIS — E871 Hypo-osmolality and hyponatremia: Secondary | ICD-10-CM | POA: Diagnosis present

## 2019-11-03 DIAGNOSIS — Z882 Allergy status to sulfonamides status: Secondary | ICD-10-CM

## 2019-11-03 DIAGNOSIS — Z20822 Contact with and (suspected) exposure to covid-19: Secondary | ICD-10-CM | POA: Diagnosis not present

## 2019-11-03 DIAGNOSIS — D62 Acute posthemorrhagic anemia: Secondary | ICD-10-CM | POA: Diagnosis present

## 2019-11-03 LAB — COMPREHENSIVE METABOLIC PANEL
ALT: 30 U/L (ref 0–44)
AST: 30 U/L (ref 15–41)
Albumin: 3.2 g/dL — ABNORMAL LOW (ref 3.5–5.0)
Alkaline Phosphatase: 32 U/L — ABNORMAL LOW (ref 38–126)
Anion gap: 8 (ref 5–15)
BUN: 49 mg/dL — ABNORMAL HIGH (ref 8–23)
CO2: 22 mmol/L (ref 22–32)
Calcium: 8.2 mg/dL — ABNORMAL LOW (ref 8.9–10.3)
Chloride: 103 mmol/L (ref 98–111)
Creatinine, Ser: 2 mg/dL — ABNORMAL HIGH (ref 0.44–1.00)
GFR calc Af Amer: 26 mL/min — ABNORMAL LOW (ref >60)
GFR calc non Af Amer: 22 mL/min — ABNORMAL LOW (ref >60)
Glucose, Bld: 136 mg/dL — ABNORMAL HIGH (ref 70–99)
Potassium: 3.9 mmol/L (ref 3.5–5.1)
Sodium: 133 mmol/L — ABNORMAL LOW (ref 135–145)
Total Bilirubin: 0.8 mg/dL (ref 0.3–1.2)
Total Protein: 5.3 g/dL — ABNORMAL LOW (ref 6.5–8.1)

## 2019-11-03 LAB — CBC
HCT: 19.6 % — ABNORMAL LOW (ref 36.0–46.0)
Hemoglobin: 5.6 g/dL — CL (ref 12.0–15.0)
MCH: 31.1 pg (ref 26.0–34.0)
MCHC: 28.6 g/dL — ABNORMAL LOW (ref 30.0–36.0)
MCV: 108.9 fL — ABNORMAL HIGH (ref 80.0–100.0)
Platelets: 206 10*3/uL (ref 150–400)
RBC: 1.8 MIL/uL — ABNORMAL LOW (ref 3.87–5.11)
RDW: 22.8 % — ABNORMAL HIGH (ref 11.5–15.5)
WBC: 7.5 10*3/uL (ref 4.0–10.5)
nRBC: 1.5 % — ABNORMAL HIGH (ref 0.0–0.2)

## 2019-11-03 LAB — PROTIME-INR
INR: 3.1 — ABNORMAL HIGH (ref 0.8–1.2)
Prothrombin Time: 31.6 seconds — ABNORMAL HIGH (ref 11.4–15.2)

## 2019-11-03 LAB — MRSA PCR SCREENING: MRSA by PCR: NEGATIVE

## 2019-11-03 LAB — MAGNESIUM: Magnesium: 2.1 mg/dL (ref 1.7–2.4)

## 2019-11-03 LAB — SARS CORONAVIRUS 2 (TAT 6-24 HRS): SARS Coronavirus 2: NEGATIVE

## 2019-11-03 LAB — GLUCOSE, CAPILLARY: Glucose-Capillary: 118 mg/dL — ABNORMAL HIGH (ref 70–99)

## 2019-11-03 LAB — PHOSPHORUS: Phosphorus: 3.8 mg/dL (ref 2.5–4.6)

## 2019-11-03 LAB — POC OCCULT BLOOD, ED: Fecal Occult Bld: POSITIVE — AB

## 2019-11-03 LAB — APTT: aPTT: 36 seconds (ref 24–36)

## 2019-11-03 LAB — PREPARE RBC (CROSSMATCH)

## 2019-11-03 MED ORDER — AMIODARONE HCL 200 MG PO TABS
200.0000 mg | ORAL_TABLET | Freq: Every day | ORAL | Status: DC
  Administered 2019-11-04 – 2019-11-09 (×6): 200 mg via ORAL
  Filled 2019-11-03 (×6): qty 1

## 2019-11-03 MED ORDER — ALPRAZOLAM 0.25 MG PO TABS
0.2500 mg | ORAL_TABLET | Freq: Once | ORAL | Status: AC
  Administered 2019-11-03: 0.25 mg via ORAL
  Filled 2019-11-03: qty 1

## 2019-11-03 MED ORDER — ISOSORBIDE MONONITRATE ER 60 MG PO TB24
120.0000 mg | ORAL_TABLET | Freq: Every day | ORAL | Status: DC
  Administered 2019-11-04 – 2019-11-09 (×6): 120 mg via ORAL
  Filled 2019-11-03 (×6): qty 2

## 2019-11-03 MED ORDER — SODIUM CHLORIDE 0.9 % IV SOLN: 10.0000 mL/h | Freq: Once | INTRAVENOUS | Status: DC

## 2019-11-03 MED ORDER — METOPROLOL TARTRATE 25 MG PO TABS
25.0000 mg | ORAL_TABLET | Freq: Every day | ORAL | Status: DC
  Administered 2019-11-04 – 2019-11-09 (×6): 25 mg via ORAL
  Filled 2019-11-03 (×6): qty 1

## 2019-11-03 MED ORDER — SODIUM CHLORIDE 0.9 % IV BOLUS: 500.0000 mL | Freq: Once | INTRAVENOUS | Status: DC

## 2019-11-03 MED ORDER — ATORVASTATIN CALCIUM 80 MG PO TABS
80.0000 mg | ORAL_TABLET | Freq: Every day | ORAL | Status: DC
  Administered 2019-11-04 – 2019-11-09 (×6): 80 mg via ORAL
  Filled 2019-11-03 (×6): qty 1

## 2019-11-03 NOTE — ED Provider Notes (Signed)
Medical screening examination/treatment/procedure(s) were conducted as a shared visit with non-physician practitioner(s) and myself.  I personally evaluated the patient during the encounter.  EKG Interpretation  Date/Time:  Thursday November 03 2019 15:19:52 EST Ventricular Rate:  64 PR Interval:  130 QRS Duration: 130 QT Interval:  446 QTC Calculation: 460 R Axis:   -25 Text Interpretation: Sinus rhythm with Premature supraventricular complexes Non-specific intra-ventricular conduction block Cannot rule out Septal infarct , age undetermined T wave abnormality, consider lateral ischemia Abnormal ECG No significant change since last tracing Confirmed by Fredia Sorrow [phone]) on 11/03/2019 4:33:40 PM   Patient seen by me along with physician assistant.  Patient coming in for surgery generalized weakness.  On Saturday she had red blood in her bowel movements.  She is had history of GI bleeds in the past but has not required blood transfusion recently.  Recently seen in the cardiology Coumadin clinic and her INR was elevated at 4.7 she was told to hold her Coumadin for 2 days and then was due today to start a half a tablet but she has not taken that yet.  Patient seen some black bowel movements.  But no red blood since Saturday.  Hemoglobin here today is 5.6.  Patient will require blood transfusion.  Stool was Hemoccult positive but not grossly bloody.  Patient's not hypotensive.  No abdominal pain.  INR still pending.  Patient will require admission probably hospitalist service.  May require some consultation from cardiology depending on what the INR level is comes back at.  Patient's electrolytes also significant for BUN of 49 that could be elevated due to the blood.  Her creatinine is at 2.   Fredia Sorrow, MD 11/03/19 [phone]

## 2019-11-03 NOTE — ED Notes (Signed)
Fritz Pickerel [phone]- would like pt updates

## 2019-11-03 NOTE — ED Provider Notes (Signed)
[facility] EMERGENCY DEPARTMENT Provider Note   CSN: [id number] Arrival date & time: 11/03/19  1502     History Chief Complaint  Patient presents with  . Weakness    [name] is a 86 y.o. female with medical history significant for A. fib, mechanical valve, on Coumadin, history of GI bleeds, chronic dyspnea on exertion who presents for evaluation of weakness x6 days.  Seen by cardiology clinic noted to have supratherapeutic INR 2 days ago.  She has helped her Coumadin over the last 2 days.  Patient states she had generalized weakness.  Noticed 1 week ago she had one stool with bright red blood and then 2 days worth of dark melanotic stools.  She denies any lightheadedness, dizziness, headache, chest pain, shortness of breath, domino pain, diarrhea, dysuria, unilateral weakness, rashes or lesions.  Denies additional aggravating or alleviating factors.  Patient states she has needed blood transfusions previously for GI bleeds however it has been many years.  She is not currently followed by GI.  DCCV 08/15/19 for Afib. Chronic SOB with movement per Cards note on 09/06/19  Mechanical valve Coumadin + Plavix last INR 11/01/19 4.7. Supposed to hold Coumadin for supratheraputic INR and F/U 11/08/19. Had blood in stool at Cards visit  PCP- Ehinger  HPI     Past Medical History:  Diagnosis Date  . Anemia   . Anginal pain ([place])   . Arteriovenous malformation of gastrointestinal tract   . Arthritis    "fingers mostly" (04/12/2015)  . Carotid artery disease ([place])    Carotid US 3/17:  52-77% RICA; 82-42% LICA; Elevated bilateral subclavian artery velocities >>f/u 1 year. // Carotid US 4/18: R 40-59; L 1-39 >> FU 1 year // Carotid US 10/2018: R 40-59; L 1-39, L subclavian stenosis   . CHF (congestive heart failure) ([place])   . Chronic kidney disease (CKD), stage III (moderate)   . Chronic lower GI bleeding    "today; last time was ~ 8 yr ago; used to have them often before  that too" (02/11/2013)  . Coronary artery disease   . DJD (degenerative joint disease)   . Dysrhythmia   . Heart murmur   . History of blood transfusion    "a few times over the years; usually related to my Coumadin" (04/12/2015  . History of gout   . Hyperlipemia   . Hypertension   . Macula lutea degeneration   . Old MI (myocardial infarction)    "a coulple /dr in 02/2008; I never even knew I'd had them" (04/12/2015)  . Type II diabetes mellitus [facility])     Patient Active Problem List   Diagnosis Date Noted  . Sepsis ([place]) 11/28/2018  . Chronic diastolic CHF (congestive heart failure) ([place]) 10/02/2018  . Fall 10/01/2018  . Syncope 10/01/2018  . UTI (urinary tract infection) 10/01/2018  . Hypoglycemia 10/01/2018  . Nausea vomiting and diarrhea 10/01/2018  . Hematochezia   . AVM (arteriovenous malformation) of small bowel, acquired   . Hemorrhoids, internal, with bleeding   . Acute kidney injury ([place])   . Anticoagulated   . Troponin level elevated   . Acute lower GI bleeding/known history AVMs 04/15/2015  . HTN (hypertension) 04/15/2015  . Hyperlipidemia 04/15/2015  . Elevated troponin 04/15/2015  . CKD (chronic kidney disease), stage III ([place]) 04/15/2015  . PAF (paroxysmal atrial fibrillation) ([place]) 04/15/2015  . GI bleed 04/15/2015  . Carotid stenosis 11/28/2013  . Encounter for therapeutic drug monitoring 11/01/2013  .  Aortic valve disorder 07/12/2013  . Heart valve replaced by other means 07/12/2013  . Normocytic anemia 02/11/2013  . Chronic anticoagulation 02/11/2013  . Acute blood loss anemia 02/11/2013  . S/P AVR (aortic valve replacement) 02/11/2013  . Coronary Artery Disease 02/11/2013  . Type II diabetes mellitus with renal manifestations ([place]) 02/11/2013    Past Surgical History:  Procedure Laterality Date  . AORTIC VALVE REPLACEMENT  1993  . APPENDECTOMY  1953  . CARDIAC CATHETERIZATION  ~1990  . CARDIAC CATHETERIZATION  04/12/2015   Procedure: Coronary  Stent Intervention;  Surgeon: Jettie Booze, MD;    SYNERGY DES 3.5X16 to the ostial RCA   . CARDIAC CATHETERIZATION  04/12/2015   Procedure: Coronary/Graft Angiography;  Surgeon: Eloy End, MD; LAD & CFX 100%, patent LIMA-LAD, SVG-D1; SVG-OM-PDA first limb 100%, 2nd limb patent; oRCA 75%>0 w/ stent  . [place]   St. Jude/notes 10/29/2003 (02/11/2013)  . CARDIOVERSION N/A 08/15/2019   Procedure: CARDIOVERSION;  Surgeon: Acie Fredrickson Wonda Cheng, MD;  Location: [facility] ENDOSCOPY;  Service: Cardiovascular;  Laterality: N/A;  . COLONOSCOPY N/A 02/13/2013   Procedure: COLONOSCOPY;  Surgeon: Lafayette Dragon, MD;  Location: [facility] ENDOSCOPY;  Service: Endoscopy;  Laterality: N/A;  . CORONARY ANGIOPLASTY WITH STENT PLACEMENT  2009+   "3 at least; put in 1 stent at a time" (02/11/2013)  . CORONARY ARTERY BYPASS GRAFT  1990   LIMA-LAD, SVG-OM-PDA, SVG-D1  . DILATION AND CURETTAGE OF UTERUS  1960's   'after a miscarriage" (02/11/2013)  . ENTEROSCOPY N/A 02/13/2013   Procedure: ENTEROSCOPY;  Surgeon: Lafayette Dragon, MD;  Location: [facility] ENDOSCOPY;  Service: Endoscopy;  Laterality: N/A;  . EXPLORATORY LAPAROTOMY  02/24/2008   which revealed a retroperitoneal hematoma and bleeding from the right external iliac artery/notes 03/02/2008 (02/11/2013)   . FLEXIBLE SIGMOIDOSCOPY N/A 04/21/2015   Procedure: FLEXIBLE SIGMOIDOSCOPY;  Surgeon: Ladene Artist, MD;  Location: [facility] ENDOSCOPY;  Service: Endoscopy;  Laterality: N/A;  . TEE WITHOUT CARDIOVERSION N/A 12/01/2018   Procedure: TRANSESOPHAGEAL ECHOCARDIOGRAM (TEE);  Surgeon: Jerline Pain, MD;  Location: [facility] ENDOSCOPY;  Service: Cardiovascular;  Laterality: N/A;     OB History   No obstetric history on file.     Family History  Problem Relation Age of Onset  . Heart disease Mother   . Heart disease Father     Social History   Tobacco Use  . Smoking status: Never Smoker  . Smokeless tobacco: Never Used  Substance Use Topics  . Alcohol use: No   . Drug use: No    Home Medications Prior to Admission medications   Medication Sig Start Date End Date Taking? Authorizing Provider  amiodarone (PACERONE) 200 MG tablet Take 1 tablet (200 mg total) by mouth daily. 09/06/19   Imogene Burn, PA-C  aspirin EC 81 MG tablet Take 1 tablet (81 mg total) by mouth daily. 04/17/16   Jettie Booze, MD  atorvastatin (LIPITOR) 80 MG tablet Take 1 tablet by mouth once daily 09/19/19   Jettie Booze, MD  bevacizumab (AVASTIN) 1.25 mg/0.1 mL SOLN Place 1.25 mg into the left eye every 8 (eight) weeks.    [provider]  ezetimibe (ZETIA) 10 MG tablet TAKE 1 TABLET BY MOUTH ONCE DAILY PLEASE  MAKE  OVERDUE  APPOINTMENT  WITH  DR  Irish Lack  BEFORE  ANYMORE  REFILLS Patient taking differently: Take 10 mg by mouth daily.  07/06/19   Jettie Booze, MD  FeFum-FePo-FA-B Cmp-C-Zn-Mn-Cu [employer] [employer]  PLUS) 162-115.2-1 MG CAPS Take 1 tablet by mouth 2 (two) times daily. 05/22/15   [provider]  folic acid (FOLVITE) 338 MCG tablet Take 400 mcg by mouth every evening.     [provider]  furosemide (LASIX) 20 MG tablet Take 1 tablet (20 mg total) by mouth daily. You may take an extra 20 mg as needed for 2-3 lb weight gain overnight 08/23/19   Imogene Burn, PA-C  isosorbide mononitrate (IMDUR) 60 MG 24 hr tablet Take 1 tablet (60 mg total) by mouth daily. Patient taking differently: Take 120 mg by mouth daily.  07/07/19   Jettie Booze, MD  JANTOVEN 5 MG tablet TAKE 1/2 TO 1 (ONE-HALF TO ONE) TABLET BY MOUTH ONCE DAILY AS  DIRECTED  BY  COUMADIN  CLINIC 10/25/19   Jettie Booze, MD  JANUVIA 50 MG tablet Take 50 mg by mouth daily.  06/14/19   [provider]  losartan (COZAAR) 50 MG tablet Take 50 mg by mouth daily.    [provider]  metoprolol tartrate (LOPRESSOR) 25 MG tablet Take 1 tablet (25 mg total) by mouth daily. 07/19/19   Imogene Burn, PA-C  Multiple Vitamin (MULTIVITAMIN WITH  MINERALS) TABS Take 1 tablet by mouth daily.    [provider]  Multiple Vitamins-Minerals (PRESERVISION AREDS 2 PO) Take 1 tablet by mouth 2 (two) times daily.     [provider]  nitroGLYCERIN (NITROSTAT) 0.4 MG SL tablet Place 1 tablet (0.4 mg total) under the tongue every 5 (five) minutes as needed for chest pain. Please make appt for January. 1st attempt 09/07/17   Jettie Booze, MD  Omega-3 Fatty Acids 1200 MG CAPS Take 600 mg by mouth 2 (two) times daily.     [provider]    Allergies    Darvon [propoxyphene], Lisinopril, Septra [sulfamethoxazole-trimethoprim], Warfarin and related, and Penicillins  Review of Systems   Review of Systems  Constitutional: Positive for fatigue. Negative for activity change, appetite change, chills, diaphoresis, fever and unexpected weight change.  HENT: Negative.   Respiratory: Negative.   Cardiovascular: Negative.   Gastrointestinal: Positive for blood in stool and nausea (1 episode of NBNB emesis yesterday). Negative for abdominal distention, abdominal pain, anal bleeding, constipation, diarrhea, rectal pain and vomiting.       BRBPR and melenotic stool 1 week ago  Genitourinary: Negative.   Musculoskeletal: Negative.   Skin: Negative.   Neurological: Positive for weakness (Generalized). Negative for dizziness, tremors, seizures, syncope, facial asymmetry, speech difficulty, light-headedness, numbness and headaches.  All other systems reviewed and are negative.   Physical Exam Updated Vital Signs BP (!) 123/50   Pulse (!) 59   Temp 98.6 F (37 C) (Oral)   Resp 20   SpO2 100%   Physical Exam Vitals and nursing note reviewed. Exam conducted with a chaperone present.  Constitutional:      General: She is not in acute distress.    Appearance: She is well-developed. She is not toxic-appearing or diaphoretic.     Comments: Pale appearing  HENT:     Head: Normocephalic and atraumatic.  Eyes:      Extraocular Movements: Extraocular movements intact.     Pupils: Pupils are equal, round, and reactive to light.     Comments: Pale conjunctiva. EOM intact. No nystagmus  Neck:     Trachea: Trachea and phonation normal.  Cardiovascular:     Rate and Rhythm: Normal rate.     Pulses:  Normal pulses.          Dorsalis pedis pulses are 2+ on the right side and 2+ on the left side.       Posterior tibial pulses are 2+ on the right side and 2+ on the left side.     Heart sounds: Normal heart sounds.  Pulmonary:     Effort: Pulmonary effort is normal. No respiratory distress.     Breath sounds: Normal breath sounds and air entry.  Chest:     Comments: Old midline surgical scar to chest Abdominal:     General: Bowel sounds are normal. There is no distension.     Palpations: Abdomen is soft.     Tenderness: There is no abdominal tenderness. There is no right CVA tenderness, left CVA tenderness, guarding or rebound.  Genitourinary:    Comments: Female RN in room. Dark brown stool in vault. Old hemorrhoid skin tags. No fluctuance or induration. No gross blood on exam. Musculoskeletal:        General: Normal range of motion.     Cervical back: Full passive range of motion without pain and normal range of motion.     Comments: Moves all 4 extremities without difficulty. Compartment soft. Homans sign negative  Skin:    General: Skin is warm and dry.     Capillary Refill: Capillary refill takes 2 to 3 seconds.     Coloration: Skin is pale.     Comments: Pallor. Cap refill 2-3 seconds  Neurological:     Mental Status: She is alert.     Cranial Nerves: Cranial nerves are intact.     Sensory: Sensation is intact.     Motor: Motor function is intact.     Coordination: Coordination is intact.     Gait: Gait is intact.     Comments: CN 2-12 grossly intact. No facial droop. No nystagmus. Negative finger to nose, romberg, heel to shin.    ED Results / Procedures / Treatments   Labs (all labs  ordered are listed, but only abnormal results are displayed) Labs Reviewed  COMPREHENSIVE METABOLIC PANEL - Abnormal; Notable for the following components:      Result Value   Sodium 133 (*)    Glucose, Bld 136 (*)    BUN 49 (*)    Creatinine, Ser 2.00 (*)    Calcium 8.2 (*)    Total Protein 5.3 (*)    Albumin 3.2 (*)    Alkaline Phosphatase 32 (*)    GFR calc non Af Amer 22 (*)    GFR calc Af Amer 26 (*)    All other components within normal limits  CBC - Abnormal; Notable for the following components:   RBC 1.80 (*)    Hemoglobin 5.6 (*)    HCT 19.6 (*)    MCV 108.9 (*)    MCHC 28.6 (*)    RDW 22.8 (*)    nRBC 1.5 (*)    All other components within normal limits  PROTIME-INR - Abnormal; Notable for the following components:   Prothrombin Time 31.6 (*)    INR 3.1 (*)    All other components within normal limits  POC OCCULT BLOOD, ED - Abnormal; Notable for the following components:   Fecal Occult Bld POSITIVE (*)    All other components within normal limits  SARS CORONAVIRUS 2 (TAT 6-24 HRS)  APTT  TYPE AND SCREEN  PREPARE RBC (CROSSMATCH)    EKG EKG Interpretation  Date/Time:  Thursday November 03 2019 15:19:52 EST Ventricular Rate:  64 PR Interval:  130 QRS Duration: 130 QT Interval:  446 QTC Calculation: 460 R Axis:   -25 Text Interpretation: Sinus rhythm with Premature supraventricular complexes Non-specific intra-ventricular conduction block Cannot rule out Septal infarct , age undetermined T wave abnormality, consider lateral ischemia Abnormal ECG No significant change since last tracing Confirmed by Fredia Sorrow [phone]) on 11/03/2019 4:33:40 PM   Radiology DG Chest Portable 1 View  Result Date: 11/03/2019 CLINICAL DATA:  Weakness and shortness of breath. EXAM: PORTABLE CHEST 1 VIEW COMPARISON:  Radiograph 11/27/2018 FINDINGS: Stable mild cardiomegaly post CABG. Possible trace pleural effusions. No pulmonary edema. No focal airspace disease. No  pneumothorax. No acute osseous abnormalities are seen. IMPRESSION: 1. Possible trace pleural effusions. 2. Stable cardiomegaly post CABG. Aortic Atherosclerosis (ICD10-I70.0). Electronically Signed   By: Keith Rake M.D.   On: 11/03/2019 16:51    Procedures .Critical Care Performed by: Nettie Elm, PA-C Authorized by: Nettie Elm, PA-C   Critical care provider statement:    Critical care time (minutes):  45   Critical care was necessary to treat or prevent imminent or life-threatening deterioration of the following conditions: Critically low anemia requiring transfusion and admission.   Critical care was time spent personally by me on the following activities:  Discussions with consultants, evaluation of patient's response to treatment, examination of patient, ordering and performing treatments and interventions, ordering and review of laboratory studies, ordering and review of radiographic studies, pulse oximetry, re-evaluation of patient's condition, obtaining history from patient or surrogate and review of old charts   (including critical care time)  Medications Ordered in ED Medications  0.9 %  sodium chloride infusion (has no administration in time range)  sodium chloride 0.9 % bolus 500 mL (has no administration in time range)   ED Course  I have reviewed the triage vital signs and the nursing notes.  Pertinent labs & imaging results that were available during my care of the patient were reviewed by me and considered in my medical decision making (see chart for details).  86 year old female presents for evaluation of generalized weakness.  She has a nonfocal neuro exam without deficits.  Heart and lungs clear.  Abdomen soft, nontender.  Noted bright red blood in stool 1 week ago for 1 bowel movement and subsequently 2 episodes of melanotic stool.  History of GI bleeds.  On Coumadin for mechanical valve, supratherapeutic level 2 days ago.  She has held her Coumadin  over the last 2 days due to this.  Patient with pallor on exam.  Labs obtained from triage shows hemoglobin 5.6.  Her occult is positive.  Metabolic panel with mild AKI with creatinine 2.0, elevated BUN.  Discussed risk versus benefit of transfusion.  Patient elects for blood transfusion.  She is pending her INR.  Will transfuse with 2 units admit to hospitalist service.  Will wait for INR to return to assess for reversal versus holding her Coumadin. Given on abd pain, diarrhea, constipation will hold on CT AP given AKI and soft abd. Low suspicion for diverticulitis, SBI, bowel obstruction and perforation.  INR 3.1  CONSULT with Dr. Marthenia Rolling with [facility] who will evaluate patient for admission. Request consult GI for assessment. She is not currently followed by GI.  CONSULT with Dr. Benson Norway with GI. He will evaluate patient tomorrow. If patient becomes hemodynamically unstable over the course of the night he can be re-consulted.  Patient discussed with attending Dr. Wallie Char who agrees  with above treatment, plan and disopsition.    MDM Rules/Calculators/A&P                      Final Clinical Impression(s) / ED Diagnoses Final diagnoses:  Anemia, unspecified type  Acute lower GI bleeding  AKI (acute kidney injury) [facility])    Rx / DC Orders ED Discharge Orders    None       Joanna Borawski A, PA-C 11/03/19 1841    Fredia Sorrow, MD 11/03/19 2249

## 2019-11-03 NOTE — H&P (Signed)
History and Physical  [name] VVO:[id number] DOB: July 09, 1933 DOA: 11/03/2019  Referring physician: ER provider PCP: Gaynelle Arabian, MD  Outpatient Specialists:  Patient coming from: Home  Chief Complaint: Rectal bleed, dyspnea on exertion and weakness  HPI:  Patient is an 86 year old Caucasian female with past medical history significant for atrial fibrillation, status post mechanical aortic valve replacement, chronic kidney disease stage III, diastolic congestive heart failure with last echocardiogram revealed an ejection fraction of greater than 65%, chronic lower GI bleed, AV malformation, MI, coronary artery disease, diabetes mellitus, hypertension and hyperlipidemia.  Patient is on Coumadin for atrial fibrillation and prostatic aortic heart valve.  On 11/01/2019, INR was 4.7.  Patient's Coumadin has been on hold.  Patient reported significant dark blood per rectum 5 days ago, and has had intermittent rectal bleed since then, though, not as significant.  Patient endorsed associated dyspnea on exertion and weakness.  No chest pain.  No palpitation.  No nausea or vomiting and no diarrhea.  No headache, no neck pain, no fever or chills, no abdominal pain, no urinary symptoms and no use of NSAIDs.  On presentation to the hospital, patient's hemoglobin was 5.6 g/dL (baseline of 10.9 g/dL).  Hospitalist team has been asked to admit patient for further assessment and management.  ED Course: On presentation to the hospital, vitals revealed temperature of 98.2, blood pressure 148/68, heart rate of 64, respiratory rate of 18 and O2 sat of 100%.  Patient is currently being transfused.  Packed red blood cells.  ER provider will consult GI team.  Pertinent labs: Hemoglobin is 5.6 with hematocrit of 19.6, sodium of 133, potassium of 3.9, BUN of 49, creatinine of 2 (baseline creatinine is 1.5-1.67).  EKG: Independently reviewed.   Imaging: independently reviewed.   Review of Systems:  Negative for  fever, visual changes, sore throat, rash, new muscle aches, chest pain, dysuria, bleeding, n/v/abdominal pain.  Past Medical History:  Diagnosis Date  . Anemia   . Anginal pain ([place])   . Arteriovenous malformation of gastrointestinal tract   . Arthritis    "fingers mostly" (04/12/2015)  . Carotid artery disease ([place])    Carotid US 3/17:  26-94% RICA; 85-46% LICA; Elevated bilateral subclavian artery velocities >>f/u 1 year. // Carotid US 4/18: R 40-59; L 1-39 >> FU 1 year // Carotid US 10/2018: R 40-59; L 1-39, L subclavian stenosis   . CHF (congestive heart failure) ([place])   . Chronic kidney disease (CKD), stage III (moderate)   . Chronic lower GI bleeding    "today; last time was ~ 8 yr ago; used to have them often before that too" (02/11/2013)  . Coronary artery disease   . DJD (degenerative joint disease)   . Dysrhythmia   . Heart murmur   . History of blood transfusion    "a few times over the years; usually related to my Coumadin" (04/12/2015  . History of gout   . Hyperlipemia   . Hypertension   . Macula lutea degeneration   . Old MI (myocardial infarction)    "a coulple /dr in 02/2008; I never even knew I'd had them" (04/12/2015)  . Type II diabetes mellitus ([place])     Past Surgical History:  Procedure Laterality Date  . AORTIC VALVE REPLACEMENT  1993  . APPENDECTOMY  1953  . CARDIAC CATHETERIZATION  ~1990  . CARDIAC CATHETERIZATION  04/12/2015   Procedure: Coronary Stent Intervention;  Surgeon: Jettie Booze, MD;    SYNERGY DES 3.5X16 to the  ostial RCA   . CARDIAC CATHETERIZATION  04/12/2015   Procedure: Coronary/Graft Angiography;  Surgeon: Eloy End, MD; LAD & CFX 100%, patent LIMA-LAD, SVG-D1; SVG-OM-PDA first limb 100%, 2nd limb patent; oRCA 75%>0 w/ stent  . [place]   St. Jude/notes 10/29/2003 (02/11/2013)  . CARDIOVERSION N/A 08/15/2019   Procedure: CARDIOVERSION;  Surgeon: Acie Fredrickson Wonda Cheng, MD;  Location: [facility] ENDOSCOPY;  Service:  Cardiovascular;  Laterality: N/A;  . COLONOSCOPY N/A 02/13/2013   Procedure: COLONOSCOPY;  Surgeon: Lafayette Dragon, MD;  Location: [facility] ENDOSCOPY;  Service: Endoscopy;  Laterality: N/A;  . CORONARY ANGIOPLASTY WITH STENT PLACEMENT  2009+   "3 at least; put in 1 stent at a time" (02/11/2013)  . CORONARY ARTERY BYPASS GRAFT  1990   LIMA-LAD, SVG-OM-PDA, SVG-D1  . DILATION AND CURETTAGE OF UTERUS  1960's   'after a miscarriage" (02/11/2013)  . ENTEROSCOPY N/A 02/13/2013   Procedure: ENTEROSCOPY;  Surgeon: Lafayette Dragon, MD;  Location: [facility] ENDOSCOPY;  Service: Endoscopy;  Laterality: N/A;  . EXPLORATORY LAPAROTOMY  02/24/2008   which revealed a retroperitoneal hematoma and bleeding from the right external iliac artery/notes 03/02/2008 (02/11/2013)   . FLEXIBLE SIGMOIDOSCOPY N/A 04/21/2015   Procedure: FLEXIBLE SIGMOIDOSCOPY;  Surgeon: Ladene Artist, MD;  Location: [facility] ENDOSCOPY;  Service: Endoscopy;  Laterality: N/A;  . TEE WITHOUT CARDIOVERSION N/A 12/01/2018   Procedure: TRANSESOPHAGEAL ECHOCARDIOGRAM (TEE);  Surgeon: Jerline Pain, MD;  Location: [facility] ENDOSCOPY;  Service: Cardiovascular;  Laterality: N/A;     reports that she has never smoked. She has never used smokeless tobacco. She reports that she does not drink alcohol or use drugs.  Allergies  Allergen Reactions  . Darvon [Propoxyphene] Nausea And Vomiting  . Lisinopril Cough  . Septra [Sulfamethoxazole-Trimethoprim] Other (See Comments)    Increased INR  . Warfarin And Related Other (See Comments)    ONLY TOLERATES BRAND  . Penicillins Rash    Did it involve swelling of the face/tongue/throat, SOB, or low BP? No Did it involve sudden or severe rash/hives, skin peeling, or any reaction on the inside of your mouth or nose? No Did you need to seek medical attention at a hospital or doctor's office? No When did it last happen?15 years If all above answers are "NO", may proceed with cephalosporin use.    Family History  Problem  Relation Age of Onset  . Heart disease Mother   . Heart disease Father      Prior to Admission medications   Medication Sig Start Date End Date Taking? Authorizing Provider  amiodarone (PACERONE) 200 MG tablet Take 1 tablet (200 mg total) by mouth daily. 09/06/19   Imogene Burn, PA-C  aspirin EC 81 MG tablet Take 1 tablet (81 mg total) by mouth daily. 04/17/16   Jettie Booze, MD  atorvastatin (LIPITOR) 80 MG tablet Take 1 tablet by mouth once daily 09/19/19   Jettie Booze, MD  bevacizumab (AVASTIN) 1.25 mg/0.1 mL SOLN Place 1.25 mg into the left eye every 8 (eight) weeks.    [provider]  ezetimibe (ZETIA) 10 MG tablet TAKE 1 TABLET BY MOUTH ONCE DAILY PLEASE  MAKE  OVERDUE  APPOINTMENT  WITH  DR  Irish Lack  BEFORE  ANYMORE  REFILLS Patient taking differently: Take 10 mg by mouth daily.  07/06/19   Jettie Booze, MD  FeFum-FePo-FA-B Cmp-C-Zn-Mn-Cu (SE-TAN PLUS) 162-115.2-1 MG CAPS Take 1 tablet by mouth 2 (two) times daily. 05/22/15   [provider]  folic acid (FOLVITE) 144 MCG tablet Take 400 mcg by mouth every evening.     [provider]  furosemide (LASIX) 20 MG tablet Take 1 tablet (20 mg total) by mouth daily. You may take an extra 20 mg as needed for 2-3 lb weight gain overnight 08/23/19   Imogene Burn, PA-C  isosorbide mononitrate (IMDUR) 60 MG 24 hr tablet Take 1 tablet (60 mg total) by mouth daily. Patient taking differently: Take 120 mg by mouth daily.  07/07/19   Jettie Booze, MD  JANTOVEN 5 MG tablet TAKE 1/2 TO 1 (ONE-HALF TO ONE) TABLET BY MOUTH ONCE DAILY AS  DIRECTED  BY  COUMADIN  CLINIC 10/25/19   Jettie Booze, MD  JANUVIA 50 MG tablet Take 50 mg by mouth daily.  06/14/19   [provider]  losartan (COZAAR) 50 MG tablet Take 50 mg by mouth daily.    [provider]  metoprolol tartrate (LOPRESSOR) 25 MG tablet Take 1 tablet (25 mg total) by mouth daily. 07/19/19   Imogene Burn, PA-C   Multiple Vitamin (MULTIVITAMIN WITH MINERALS) TABS Take 1 tablet by mouth daily.    [provider]  Multiple Vitamins-Minerals (PRESERVISION AREDS 2 PO) Take 1 tablet by mouth 2 (two) times daily.     [provider]  nitroGLYCERIN (NITROSTAT) 0.4 MG SL tablet Place 1 tablet (0.4 mg total) under the tongue every 5 (five) minutes as needed for chest pain. Please make appt for January. 1st attempt 09/07/17   Jettie Booze, MD  Omega-3 Fatty Acids 1200 MG CAPS Take 600 mg by mouth 2 (two) times daily.     [provider]    Physical Exam: Vitals:   11/03/19 1800 11/03/19 1806 11/03/19 1819 11/03/19 1828  BP: (!) 123/50 (!) 123/50  (!) 130/45  Pulse: (!) 59   62  Resp: 17 20  20   Temp:  98.6 F (37 C)  97.9 F (36.6 C)  TempSrc:  Oral  Oral  SpO2: 97% 100%  100%  Weight:   60.8 kg   Height:   5' (1.524 m)    Constitutional:  . Appears calm and comfortable. Eyes:  . Pallor. No jaundice.  ENMT:  . external ears, nose appear normal Neck:  . Neck is supple. No JVD Respiratory:  . CTA bilaterally, no w/r/r.  . Respiratory effort normal. No retractions or accessory muscle use Cardiovascular:  . S1S2 . No LE extremity edema   Abdomen:  . Abdomen is soft and non tender. Organs are difficult to assess. Neurologic:  . Awake and alert. . Moves all limbs.  Wt Readings from Last 3 Encounters:  11/03/19 60.8 kg  09/06/19 63.9 kg  08/09/19 61.2 kg    I have personally reviewed following labs and imaging studies  Labs on Admission:  CBC: Recent Labs  Lab 11/03/19 1539  WBC 7.5  HGB 5.6*  HCT 19.6*  MCV 108.9*  PLT 818   Basic Metabolic Panel: Recent Labs  Lab 11/03/19 1539  NA 133*  K 3.9  CL 103  CO2 22  GLUCOSE 136*  BUN 49*  CREATININE 2.00*  CALCIUM 8.2*   Liver Function Tests: Recent Labs  Lab 11/03/19 1539  AST 30  ALT 30  ALKPHOS 32*  BILITOT 0.8  PROT 5.3*  ALBUMIN 3.2*   No results for input(s): LIPASE,  AMYLASE in the last 168 hours. No results for input(s): AMMONIA in the last 168 hours. Coagulation Profile: Recent  Labs  Lab 11/01/19 1202 11/03/19 1651  INR 4.7* 3.1*   Cardiac Enzymes: No results for input(s): CKTOTAL, CKMB, CKMBINDEX, TROPONINI in the last 168 hours. BNP (last 3 results) No results for input(s): PROBNP in the last 8760 hours. HbA1C: No results for input(s): HGBA1C in the last 72 hours. CBG: No results for input(s): GLUCAP in the last 168 hours. Lipid Profile: No results for input(s): CHOL, HDL, LDLCALC, TRIG, CHOLHDL, LDLDIRECT in the last 72 hours. Thyroid Function Tests: No results for input(s): TSH, T4TOTAL, FREET4, T3FREE, THYROIDAB in the last 72 hours. Anemia Panel: No results for input(s): VITAMINB12, FOLATE, FERRITIN, TIBC, IRON, RETICCTPCT in the last 72 hours. Urine analysis:    Component Value Date/Time   COLORURINE YELLOW 11/28/2018 0058   APPEARANCEUR HAZY (A) 11/28/2018 0058   LABSPEC 1.014 11/28/2018 0058   PHURINE 6.0 11/28/2018 0058   GLUCOSEU >=500 (A) 11/28/2018 0058   HGBUR MODERATE (A) 11/28/2018 0058   BILIRUBINUR NEGATIVE 11/28/2018 0058   KETONESUR NEGATIVE 11/28/2018 0058   PROTEINUR 30 (A) 11/28/2018 0058   UROBILINOGEN 0.2 04/20/2008 2135   NITRITE POSITIVE (A) 11/28/2018 0058   LEUKOCYTESUR MODERATE (A) 11/28/2018 0058   Sepsis Labs: @LABRCNTIP (procalcitonin:4,lacticidven:4) )No results found for this or any previous visit (from the past 240 hour(s)).    Radiological Exams on Admission: DG Chest Portable 1 View  Result Date: 11/03/2019 CLINICAL DATA:  Weakness and shortness of breath. EXAM: PORTABLE CHEST 1 VIEW COMPARISON:  Radiograph 11/27/2018 FINDINGS: Stable mild cardiomegaly post CABG. Possible trace pleural effusions. No pulmonary edema. No focal airspace disease. No pneumothorax. No acute osseous abnormalities are seen. IMPRESSION: 1. Possible trace pleural effusions. 2. Stable cardiomegaly post CABG. Aortic  Atherosclerosis (ICD10-I70.0). Electronically Signed   By: Keith Rake M.D.   On: 11/03/2019 16:51    EKG: Independently reviewed.   Active Problems:   Symptomatic anemia   Assessment/Plan: Rectal bleed/symptomatic anemia: -Suspect this is lower GI bleed. -However, cannot rule out upper GI bleed entirely.  Patient reported dark-colored stools per rectum. -We will admit patient for further assessment and management. -ER provider to consult GI. -Consult cardiology team in the morning.  Patient has A. fib and prosthetic aortic valve. -Monitor H/H. -Patient is currently being transfused. -INR was 4.7 2 days ago, currently 3.1. -Coumadin is currently on hold. -Further management depend on hospital course.  There is a particularly difficult situation as there is absolute need for anticoagulation.  Atrial fibrillation, chronic: Heart rate is controlled. Cardiology to be consulted in the morning. INR is currently 3.1.  Status post prosthetic aortic valve replacement: -Patient will continue to need anticoagulation. -Transfuse packed red blood cells as needed.  Diabetes mellitus: Monitor blood sugar closely as patient is currently n.p.o. except for medications.  Coronary artery disease/MI: No chest pain. Shortness of breath with exertion is likely related to anemia. Troponin x2.  Patient is at risk for acute coronary syndrome  Acute kidney injury on chronic kidney disease stage IIIb/IV: -Suspect current acute kidney injury secondary to volume depletion. -Continue with blood transfusion. -Monitor renal function and electrolytes -We will start fluids for now to avoid pulmonary edema as patient is currently being transfused with packed red blood cells. -Further management depend on hospital course.  Hyponatremia: Likely volume related Repeat BMP in the morning to blood transfusion Further management depend on above.  Hypertension: Monitor and optimize. Avoid excessive  drop in blood pressure.   DVT prophylaxis: SCD Code Status: Full code Family Communication:  Disposition Plan: This will  depend on hospital course Consults called: ER has consulted GI.  Please consult cardiology in the morning Admission status: Observation for now.  Time spent: 65 minutes  Dana Allan, MD  [facility] Pager #: [phone] 7PM-7AM contact night coverage as above  11/03/2019, 6:31 PM

## 2019-11-03 NOTE — ED Triage Notes (Signed)
Pt presents to ED w/weakness x6 days, bloody stool on Saturday, reports dark stool after one episode of bloody stool. Pt reports Coumadin 4.7 on Tuesday

## 2019-11-04 DIAGNOSIS — D649 Anemia, unspecified: Secondary | ICD-10-CM | POA: Diagnosis not present

## 2019-11-04 DIAGNOSIS — I252 Old myocardial infarction: Secondary | ICD-10-CM | POA: Diagnosis not present

## 2019-11-04 DIAGNOSIS — Z8719 Personal history of other diseases of the digestive system: Secondary | ICD-10-CM | POA: Diagnosis present

## 2019-11-04 DIAGNOSIS — M19042 Primary osteoarthritis, left hand: Secondary | ICD-10-CM | POA: Diagnosis present

## 2019-11-04 DIAGNOSIS — H3530 Unspecified macular degeneration: Secondary | ICD-10-CM | POA: Diagnosis present

## 2019-11-04 DIAGNOSIS — K921 Melena: Secondary | ICD-10-CM | POA: Diagnosis not present

## 2019-11-04 DIAGNOSIS — E785 Hyperlipidemia, unspecified: Secondary | ICD-10-CM | POA: Diagnosis present

## 2019-11-04 DIAGNOSIS — N1832 Chronic kidney disease, stage 3b: Secondary | ICD-10-CM | POA: Diagnosis present

## 2019-11-04 DIAGNOSIS — Z7901 Long term (current) use of anticoagulants: Secondary | ICD-10-CM | POA: Diagnosis not present

## 2019-11-04 DIAGNOSIS — K922 Gastrointestinal hemorrhage, unspecified: Secondary | ICD-10-CM | POA: Diagnosis not present

## 2019-11-04 DIAGNOSIS — Z951 Presence of aortocoronary bypass graft: Secondary | ICD-10-CM | POA: Diagnosis not present

## 2019-11-04 DIAGNOSIS — K5520 Angiodysplasia of colon without hemorrhage: Secondary | ICD-10-CM | POA: Diagnosis not present

## 2019-11-04 DIAGNOSIS — I2510 Atherosclerotic heart disease of native coronary artery without angina pectoris: Secondary | ICD-10-CM | POA: Diagnosis not present

## 2019-11-04 DIAGNOSIS — R791 Abnormal coagulation profile: Secondary | ICD-10-CM

## 2019-11-04 DIAGNOSIS — Z20822 Contact with and (suspected) exposure to covid-19: Secondary | ICD-10-CM | POA: Diagnosis not present

## 2019-11-04 DIAGNOSIS — K259 Gastric ulcer, unspecified as acute or chronic, without hemorrhage or perforation: Secondary | ICD-10-CM | POA: Diagnosis not present

## 2019-11-04 DIAGNOSIS — D62 Acute posthemorrhagic anemia: Secondary | ICD-10-CM

## 2019-11-04 DIAGNOSIS — I25118 Atherosclerotic heart disease of native coronary artery with other forms of angina pectoris: Secondary | ICD-10-CM | POA: Diagnosis not present

## 2019-11-04 DIAGNOSIS — I482 Chronic atrial fibrillation, unspecified: Secondary | ICD-10-CM | POA: Diagnosis not present

## 2019-11-04 DIAGNOSIS — K2950 Unspecified chronic gastritis without bleeding: Secondary | ICD-10-CM | POA: Diagnosis not present

## 2019-11-04 DIAGNOSIS — I480 Paroxysmal atrial fibrillation: Secondary | ICD-10-CM | POA: Diagnosis not present

## 2019-11-04 DIAGNOSIS — Z8249 Family history of ischemic heart disease and other diseases of the circulatory system: Secondary | ICD-10-CM | POA: Diagnosis not present

## 2019-11-04 DIAGNOSIS — I5032 Chronic diastolic (congestive) heart failure: Secondary | ICD-10-CM | POA: Diagnosis not present

## 2019-11-04 DIAGNOSIS — E871 Hypo-osmolality and hyponatremia: Secondary | ICD-10-CM | POA: Diagnosis not present

## 2019-11-04 DIAGNOSIS — M109 Gout, unspecified: Secondary | ICD-10-CM | POA: Diagnosis present

## 2019-11-04 DIAGNOSIS — E1122 Type 2 diabetes mellitus with diabetic chronic kidney disease: Secondary | ICD-10-CM | POA: Diagnosis present

## 2019-11-04 DIAGNOSIS — D6832 Hemorrhagic disorder due to extrinsic circulating anticoagulants: Secondary | ICD-10-CM | POA: Diagnosis not present

## 2019-11-04 DIAGNOSIS — I130 Hypertensive heart and chronic kidney disease with heart failure and stage 1 through stage 4 chronic kidney disease, or unspecified chronic kidney disease: Secondary | ICD-10-CM | POA: Diagnosis not present

## 2019-11-04 DIAGNOSIS — N179 Acute kidney failure, unspecified: Secondary | ICD-10-CM | POA: Diagnosis not present

## 2019-11-04 DIAGNOSIS — K254 Chronic or unspecified gastric ulcer with hemorrhage: Secondary | ICD-10-CM | POA: Diagnosis not present

## 2019-11-04 DIAGNOSIS — K5521 Angiodysplasia of colon with hemorrhage: Secondary | ICD-10-CM | POA: Diagnosis not present

## 2019-11-04 DIAGNOSIS — B9681 Helicobacter pylori [H. pylori] as the cause of diseases classified elsewhere: Secondary | ICD-10-CM | POA: Diagnosis not present

## 2019-11-04 DIAGNOSIS — Z952 Presence of prosthetic heart valve: Secondary | ICD-10-CM | POA: Diagnosis not present

## 2019-11-04 DIAGNOSIS — M19041 Primary osteoarthritis, right hand: Secondary | ICD-10-CM | POA: Diagnosis present

## 2019-11-04 DIAGNOSIS — Z7982 Long term (current) use of aspirin: Secondary | ICD-10-CM | POA: Diagnosis not present

## 2019-11-04 LAB — BPAM RBC
Blood Product Expiration Date: 202102212359
Blood Product Expiration Date: 202102212359
ISSUE DATE / TIME: 202102111757
ISSUE DATE / TIME: 202102112108
Unit Type and Rh: 9500
Unit Type and Rh: 9500

## 2019-11-04 LAB — PROTIME-INR
INR: 2.2 — ABNORMAL HIGH (ref 0.8–1.2)
Prothrombin Time: 24.5 seconds — ABNORMAL HIGH (ref 11.4–15.2)

## 2019-11-04 LAB — BASIC METABOLIC PANEL
Anion gap: 6 (ref 5–15)
BUN: 45 mg/dL — ABNORMAL HIGH (ref 8–23)
CO2: 23 mmol/L (ref 22–32)
Calcium: 8.1 mg/dL — ABNORMAL LOW (ref 8.9–10.3)
Chloride: 107 mmol/L (ref 98–111)
Creatinine, Ser: 1.93 mg/dL — ABNORMAL HIGH (ref 0.44–1.00)
GFR calc Af Amer: 27 mL/min — ABNORMAL LOW (ref >60)
GFR calc non Af Amer: 23 mL/min — ABNORMAL LOW (ref >60)
Glucose, Bld: 92 mg/dL (ref 70–99)
Potassium: 3.8 mmol/L (ref 3.5–5.1)
Sodium: 136 mmol/L (ref 135–145)

## 2019-11-04 LAB — TYPE AND SCREEN
ABO/RH(D): O NEG
Antibody Screen: NEGATIVE
Unit division: 0
Unit division: 0

## 2019-11-04 LAB — HEMOGLOBIN
Hemoglobin: 8 g/dL — ABNORMAL LOW (ref 12.0–15.0)
Hemoglobin: 8.5 g/dL — ABNORMAL LOW (ref 12.0–15.0)
Hemoglobin: 8.7 g/dL — ABNORMAL LOW (ref 12.0–15.0)

## 2019-11-04 LAB — HEMATOCRIT
HCT: 24.8 % — ABNORMAL LOW (ref 36.0–46.0)
HCT: 27.5 % — ABNORMAL LOW (ref 36.0–46.0)
HCT: 26.8 % — ABNORMAL LOW (ref 36.0–46.0)

## 2019-11-04 LAB — DIGOXIN LEVEL: Digoxin Level: 1 ng/mL (ref 0.8–2.0)

## 2019-11-04 LAB — TROPONIN I (HIGH SENSITIVITY)
Troponin I (High Sensitivity): 92 ng/L — ABNORMAL HIGH (ref <18)
Troponin I (High Sensitivity): 116 ng/L (ref <18)

## 2019-11-04 LAB — GLUCOSE, CAPILLARY
Glucose-Capillary: 96 mg/dL (ref 70–99)
Glucose-Capillary: 92 mg/dL (ref 70–99)
Glucose-Capillary: 124 mg/dL — ABNORMAL HIGH (ref 70–99)
Glucose-Capillary: 176 mg/dL — ABNORMAL HIGH (ref 70–99)

## 2019-11-04 MED ORDER — INSULIN ASPART 100 UNIT/ML ~~LOC~~ SOLN
0.0000 [IU] | SUBCUTANEOUS | Status: DC
  Administered 2019-11-04: 2 [IU] via SUBCUTANEOUS
  Administered 2019-11-05 – 2019-11-06 (×3): 1 [IU] via SUBCUTANEOUS

## 2019-11-04 MED ORDER — DIGOXIN 125 MCG PO TABS: 0.1250 mg | ORAL_TABLET | Freq: Every day | ORAL | Status: DC

## 2019-11-04 MED ORDER — EZETIMIBE 10 MG PO TABS
10.0000 mg | ORAL_TABLET | Freq: Every day | ORAL | Status: DC
  Administered 2019-11-04 – 2019-11-08 (×5): 10 mg via ORAL
  Filled 2019-11-04 (×5): qty 1

## 2019-11-04 NOTE — Progress Notes (Signed)
CRITICAL VALUE ALERT  Critical Value:  Troponin: 116  Date & Time Notied:  11/04/19 @0614   Provider Notified: X. Blount  Orders Received/Actions taken: No orders at this time

## 2019-11-04 NOTE — Consult Note (Addendum)
Cardiology Consultation:   Patient ID: [name] MRN: [id number]; DOB: 1933/07/19  Admit date: 11/03/2019 Date of Consult: 11/04/2019  Primary Care Provider: Gaynelle Arabian, MD Primary Cardiologist: Larae Grooms, MD  Primary Electrophysiologist:  None    Patient Profile:   [name] is a 86 y.o. female with a hx of CAD, aortic valve disease s/p CABG and mechanical AVR on chronic Coumadin and aspirin, afib, HFpEF, HTN, HLD, CKD stage III, type II DM who is being seen today for the evaluation of GI bleed in the setting of warfarin use for mechanical heart valve at the request of Dr. Maylene Roes.  History of Present Illness:   [name] follows with Dr. Scarlette Calico. Last seen in clinic on 09/06/19 at which time things were going well. She underwent successful DCCV on 11/23 and was continued on amiodarone. She had noted improvement in her chronic shortness of breath after cardioversion.  Patient presented to the ED with 5 days of intermittent rectal bleeding with associated progressive dyspnea on exertion and weakness. Her INR on 2/9 was found to be supratherapeutic at 4.7. Patient's last dose of coumadin was 5 days ago when she first noticed the bleeding.  She was found to have a hgb of 5.6, down from her baseline of 10.9. Hgb improved to 8 after 2 units pRBCs. GI was consulted and will consider endoscopic evaluation to look for possible source of bleeding once INR normalizes.   [name] and her husband tell me that she has a history of recurrent GI bleeds. She has known AVMs that were found on capsule endoscopy, but no source has ever been found on upper or lower endoscopy. This is her first bleed in several years.  She denies any further bleeding since admission. Light headedness and shortness of breath have improved since receiving blood. She denies any chest pain, n/v, abdominal pain. No medication or diet changes.   Heart Pathway Score:     Past Medical History:  Diagnosis Date   . Anemia   . Anginal pain ([place])   . Arteriovenous malformation of gastrointestinal tract   . Arthritis    "fingers mostly" (04/12/2015)  . Carotid artery disease ([place])    Carotid US 3/17:  84-13% RICA; 24-40% LICA; Elevated bilateral subclavian artery velocities >>f/u 1 year. // Carotid US 4/18: R 40-59; L 1-39 >> FU 1 year // Carotid US 10/2018: R 40-59; L 1-39, L subclavian stenosis   . CHF (congestive heart failure) ([place])   . Chronic kidney disease (CKD), stage III (moderate)   . Chronic lower GI bleeding    "today; last time was ~ 8 yr ago; used to have them often before that too" (02/11/2013)  . Coronary artery disease   . DJD (degenerative joint disease)   . Dysrhythmia   . Heart murmur   . History of blood transfusion    "a few times over the years; usually related to my Coumadin" (04/12/2015  . History of gout   . Hyperlipemia   . Hypertension   . Macula lutea degeneration   . Old MI (myocardial infarction)    "a coulple /dr in 02/2008; I never even knew I'd had them" (04/12/2015)  . Type II diabetes mellitus ([place])     Past Surgical History:  Procedure Laterality Date  . AORTIC VALVE REPLACEMENT  1993  . APPENDECTOMY  1953  . CARDIAC CATHETERIZATION  ~1990  . CARDIAC CATHETERIZATION  04/12/2015   Procedure: Coronary Stent Intervention;  Surgeon: Jettie Booze,  MD;    SYNERGY DES 3.5X16 to the ostial RCA   . CARDIAC CATHETERIZATION  04/12/2015   Procedure: Coronary/Graft Angiography;  Surgeon: Eloy End, MD; LAD & CFX 100%, patent LIMA-LAD, SVG-D1; SVG-OM-PDA first limb 100%, 2nd limb patent; oRCA 75%>0 w/ stent  . [place]   St. Jude/notes 10/29/2003 (02/11/2013)  . CARDIOVERSION N/A 08/15/2019   Procedure: CARDIOVERSION;  Surgeon: Acie Fredrickson Wonda Cheng, MD;  Location: [facility] ENDOSCOPY;  Service: Cardiovascular;  Laterality: N/A;  . COLONOSCOPY N/A 02/13/2013   Procedure: COLONOSCOPY;  Surgeon: Lafayette Dragon, MD;  Location: [facility] ENDOSCOPY;  Service:  Endoscopy;  Laterality: N/A;  . CORONARY ANGIOPLASTY WITH STENT PLACEMENT  2009+   "3 at least; put in 1 stent at a time" (02/11/2013)  . CORONARY ARTERY BYPASS GRAFT  1990   LIMA-LAD, SVG-OM-PDA, SVG-D1  . DILATION AND CURETTAGE OF UTERUS  1960's   'after a miscarriage" (02/11/2013)  . ENTEROSCOPY N/A 02/13/2013   Procedure: ENTEROSCOPY;  Surgeon: Lafayette Dragon, MD;  Location: [facility] ENDOSCOPY;  Service: Endoscopy;  Laterality: N/A;  . EXPLORATORY LAPAROTOMY  02/24/2008   which revealed a retroperitoneal hematoma and bleeding from the right external iliac artery/notes 03/02/2008 (02/11/2013)   . FLEXIBLE SIGMOIDOSCOPY N/A 04/21/2015   Procedure: FLEXIBLE SIGMOIDOSCOPY;  Surgeon: Ladene Artist, MD;  Location: [facility] ENDOSCOPY;  Service: Endoscopy;  Laterality: N/A;  . TEE WITHOUT CARDIOVERSION N/A 12/01/2018   Procedure: TRANSESOPHAGEAL ECHOCARDIOGRAM (TEE);  Surgeon: Jerline Pain, MD;  Location: [facility] ENDOSCOPY;  Service: Cardiovascular;  Laterality: N/A;     Home Medications:  Prior to Admission medications   Medication Sig Start Date End Date Taking? Authorizing Provider  acetaminophen (TYLENOL) 500 MG tablet Take 500 mg by mouth every 6 (six) hours as needed for mild pain or headache.   Yes [provider]  amiodarone (PACERONE) 200 MG tablet Take 1 tablet (200 mg total) by mouth daily. 09/06/19  Yes Imogene Burn, PA-C  aspirin EC 81 MG tablet Take 1 tablet (81 mg total) by mouth daily. 04/17/16  Yes Jettie Booze, MD  atorvastatin (LIPITOR) 80 MG tablet Take 1 tablet by mouth once daily Patient taking differently: Take 80 mg by mouth at bedtime.  09/19/19  Yes Jettie Booze, MD  bevacizumab (AVASTIN) 1.25 mg/0.1 mL SOLN Place 1.25 mg into the left eye every 8 (eight) weeks.   Yes [provider]  digoxin (LANOXIN) 0.125 MG tablet Take 0.125 mg by mouth daily.   Yes [provider]  ezetimibe (ZETIA) 10 MG tablet TAKE 1 TABLET BY MOUTH ONCE DAILY PLEASE   MAKE  OVERDUE  APPOINTMENT  WITH  DR  Irish Lack  BEFORE  ANYMORE  REFILLS Patient taking differently: Take 10 mg by mouth at bedtime.  07/06/19  Yes Jettie Booze, MD  FeFum-FePo-FA-B Cmp-C-Zn-Mn-Cu (SE-TAN PLUS) 162-115.2-1 MG CAPS Take 1 capsule by mouth 2 (two) times daily.  05/22/15  Yes [provider]  folic acid (FOLVITE) 175 MCG tablet Take 400 mcg by mouth every evening.    Yes [provider]  furosemide (LASIX) 20 MG tablet Take 1 tablet (20 mg total) by mouth daily. You may take an extra 20 mg as needed for 2-3 lb weight gain overnight Patient taking differently: Take 20 mg by mouth See admin instructions. Take 20 mg by mouth in the morning and and additional 20 mg as needed for an overnight weight gain of 2-3 pounds 08/23/19  Yes Imogene Burn, PA-C  isosorbide mononitrate (IMDUR) 60 MG 24 hr tablet Take 1 tablet (60 mg total) by mouth daily. 07/07/19  Yes Varanasi, Charlann Lange, MD  JANTOVEN 5 MG tablet TAKE 1/2 TO 1 (ONE-HALF TO ONE) TABLET BY MOUTH ONCE DAILY AS  DIRECTED  BY  COUMADIN  CLINIC Patient taking differently: Take 2.5-5 mg by mouth See admin instructions. Take 2.5 mg by mouth at bedtime on Sun/Wed/Fri and 5 mg on Mon/Tues/Thurs/Sat 10/25/19  Yes Jettie Booze, MD  JANUVIA 50 MG tablet Take 50 mg by mouth daily.  06/14/19  Yes [provider]  losartan (COZAAR) 50 MG tablet Take 50 mg by mouth daily.   Yes [provider]  metoprolol tartrate (LOPRESSOR) 25 MG tablet Take 1 tablet (25 mg total) by mouth daily. Patient taking differently: Take 25 mg by mouth 2 (two) times daily.  07/19/19  Yes Imogene Burn, PA-C  Multiple Vitamin (MULTIVITAMIN WITH MINERALS) TABS Take 1 tablet by mouth daily.   Yes [provider]  Multiple Vitamins-Minerals (PRESERVISION AREDS 2 PO) Take 1 tablet by mouth 2 (two) times daily.    Yes [provider]  nitroGLYCERIN (NITROSTAT) 0.4 MG SL tablet Place 1 tablet (0.4 mg total)  under the tongue every 5 (five) minutes as needed for chest pain. Please make appt for January. 1st attempt Patient taking differently: Place 0.4 mg under the tongue every 5 (five) minutes as needed for chest pain.  09/07/17  Yes Jettie Booze, MD  Omega-3 Fatty Acids (FISH OIL PO) Take 1 capsule by mouth in the morning and at bedtime.   Yes [provider]  potassium chloride (KLOR-CON) 10 MEQ tablet Take 10 mEq by mouth 2 (two) times daily.   Yes [provider]    Inpatient Medications: Scheduled Meds: . amiodarone  200 mg Oral Daily  . atorvastatin  80 mg Oral Daily  . ezetimibe  10 mg Oral QHS  . insulin aspart  0-9 Units Subcutaneous Q4H  . isosorbide mononitrate  120 mg Oral Daily  . metoprolol tartrate  25 mg Oral Daily   Continuous Infusions: . sodium chloride    . sodium chloride     PRN Meds:   Allergies:    Allergies  Allergen Reactions  . Darvon [Propoxyphene] Nausea And Vomiting  . Lisinopril Cough  . Septra [Sulfamethoxazole-Trimethoprim] Other (See Comments)    Increased INR  . Warfarin And Related Other (See Comments)    ONLY TOLERATES BRAND  . Penicillins Rash    Did it involve swelling of the face/tongue/throat, SOB, or low BP? No Did it involve sudden or severe rash/hives, skin peeling, or any reaction on the inside of your mouth or nose? No Did you need to seek medical attention at a hospital or doctor's office? No When did it last happen?15 years If all above answers are "NO", may proceed with cephalosporin use.    Social History:   Social History   Socioeconomic History  . Marital status: Married    Spouse name: Not on file  . Number of children: Not on file  . Years of education: Not on file  . Highest education level: Not on file  Occupational History  . Not on file  Tobacco Use  . Smoking status: Never Smoker  . Smokeless tobacco: Never Used  Substance and Sexual Activity  . Alcohol use: No  . Drug use: No   . Sexual activity: Yes  Other Topics Concern  . Not on file  Social  History Narrative  . Not on file   Social Determinants of Health   Financial Resource Strain:   . Difficulty of Paying Living Expenses: Not on file  Food Insecurity:   . Worried About Charity fundraiser in the Last Year: Not on file  . Ran Out of Food in the Last Year: Not on file  Transportation Needs:   . Lack of Transportation (Medical): Not on file  . Lack of Transportation (Non-Medical): Not on file  Physical Activity:   . Days of Exercise per Week: Not on file  . Minutes of Exercise per Session: Not on file  Stress:   . Feeling of Stress : Not on file  Social Connections:   . Frequency of Communication with Friends and Family: Not on file  . Frequency of Social Gatherings with Friends and Family: Not on file  . Attends Religious Services: Not on file  . Active Member of Clubs or Organizations: Not on file  . Attends Archivist Meetings: Not on file  . Marital Status: Not on file  Intimate Partner Violence:   . Fear of Current or Ex-Partner: Not on file  . Emotionally Abused: Not on file  . Physically Abused: Not on file  . Sexually Abused: Not on file    Family History:    Family History  Problem Relation Age of Onset  . Heart disease Mother   . Heart disease Father      ROS:  Please see the history of present illness.  All other ROS reviewed and negative.     Physical Exam/Data:   Vitals:   11/04/19 0042 11/04/19 0332 11/04/19 0836 11/04/19 1222  BP: (!) 120/56 (!) 107/46 (!) 140/47 (!) 140/55  Pulse: 65 77 69 64  Resp: 19 17 17 20   Temp: 98 F (36.7 C) 98.2 F (36.8 C) 98.7 F (37.1 C) 98.7 F (37.1 C)  TempSrc: Oral Oral Oral Oral  SpO2: 98% 99% 100% 98%  Weight:      Height:        Intake/Output Summary (Last 24 hours) at 11/04/2019 1356 Last data filed at 11/04/2019 0900 Gross per 24 hour  Intake 645 ml  Output --  Net 645 ml   Last 3 Weights 11/03/2019  11/03/2019 09/06/2019  Weight (lbs) 139 lb 12.4 oz 134 lb 140 lb 12.8 oz  Weight (kg) 63.4 kg 60.782 kg 63.866 kg     Body mass index is 27.3 kg/m.  General:  Well nourished, well developed, in no acute distress HEENT: normal Lymph: no adenopathy Neck: no JVD Endocrine:  No thryomegaly Vascular: No carotid bruits; FA pulses 2+ bilaterally without bruits  Cardiac:  normal S1, S2; RRR; SEM with valvular click  Lungs:  clear to auscultation bilaterally, no wheezing, rhonchi or rales  Abd: soft, nontender, no hepatomegaly  Ext: 1+ pedal edema bilaterally Musculoskeletal:  No deformities, BUE and BLE strength normal and equal Skin: warm and dry  Neuro:  CNs 2-12 intact, no focal abnormalities noted Psych:  Normal affect   EKG:  The EKG was personally reviewed and demonstrates:  NSR with intraventricular conduction delay, unchanged from prior  Telemetry:  Telemetry was personally reviewed and demonstrates:  NSR   Relevant CV Studies: TEE 12/01/18  1. The left ventricle has hyperdynamic systolic function, with an  ejection fraction of >65%. The cavity size was normal.  2. The right ventricle has normal systolc function. The cavity was  normal. There is no increase in right  ventricular wall thickness.  3. Mild mitral valve prolapse.  4. Mitral valve regurgitation is mild to moderate by color flow Doppler.  5. Tricuspid valve regurgitation is moderate.  6. Aortic valve regurgitation mild perivalvular leak.  7. Pulmonic valve regurgitation was not assessed by color flow Doppler.  8. There is evidence of plaque in the descending aorta.  9. No vegetations, no endocarditis.  10. - TAVR: TAVR valve with mild perivalvular leak. No vegetations.   Laboratory Data:  High Sensitivity Troponin:   Recent Labs  Lab 11/04/19 0337 11/04/19 0453  TROPONINIHS 92* 116*     Chemistry Recent Labs  Lab 11/03/19 1539 11/04/19 0337  NA 133* 136  K 3.9 3.8  CL 103 107  CO2 22 23    GLUCOSE 136* 92  BUN 49* 45*  CREATININE 2.00* 1.93*  CALCIUM 8.2* 8.1*  GFRNONAA 22* 23*  GFRAA 26* 27*  ANIONGAP 8 6    Recent Labs  Lab 11/03/19 1539  PROT 5.3*  ALBUMIN 3.2*  AST 30  ALT 30  ALKPHOS 32*  BILITOT 0.8   Hematology Recent Labs  Lab 11/03/19 1539 11/04/19 0337 11/04/19 1029  WBC 7.5  --   --   RBC 1.80*  --   --   HGB 5.6* 8.0* 8.5*  HCT 19.6* 24.8* 27.5*  MCV 108.9*  --   --   MCH 31.1  --   --   MCHC 28.6*  --   --   RDW 22.8*  --   --   PLT 206  --   --    BNPNo results for input(s): BNP, PROBNP in the last 168 hours.  DDimer No results for input(s): DDIMER in the last 168 hours.   Radiology/Studies:  DG Chest Portable 1 View  Result Date: 11/03/2019 CLINICAL DATA:  Weakness and shortness of breath. EXAM: PORTABLE CHEST 1 VIEW COMPARISON:  Radiograph 11/27/2018 FINDINGS: Stable mild cardiomegaly post CABG. Possible trace pleural effusions. No pulmonary edema. No focal airspace disease. No pneumothorax. No acute osseous abnormalities are seen. IMPRESSION: 1. Possible trace pleural effusions. 2. Stable cardiomegaly post CABG. Aortic Atherosclerosis (ICD10-I70.0). Electronically Signed   By: Keith Rake M.D.   On: 11/03/2019 16:51     Assessment and Plan:   Symptomatic anemia 2/2 GI bleed in the setting of supratherapeutic INR Chronic Coumadin s/p mechanical aortic valve -s/p 2 units pRBCs with improvement in hgb from 5.6>8.0; hgb stable at 8.5 today -fortunately GI bleed seems to be self-resolved. GI has been consulted and will consider endoscopic evaluation once INR normalizes -coumadin on hold; INR improved to 3.1 yesterday; repeat today still pending -INR goal for her is 2-2.5 given recurrent bleeding. Would need to start heparin gtt if she drops below 2   Paroxysmal afib -currently in NSR -continue amiodarone, Lopressor  -anticoagulation on hold as above     HFpEF -continue Imdur -lasix currently being held; would resume given  her lower extremity edema -monitor daily weights and strict I&Os  CAD s/p CABG, multiple stents -continue lipitor, zetia -aspirin on hold due to GIB  HTN -BP sable on Imdur and Lopressor     For questions or updates, please contact [facility] Please consult [email or web address] for contact info under    Signed, Delice Bison, DO  11/04/2019 1:56 PM   Patient seen and examined.  Agree with above documentation.  [name] is a 86 year old female with history of CAD status post CABG, mechanical AVR on Coumadin, atrial fibrillation, HFpEF, hypertension,  CKD stage III, type 2 diabetes who we are consulted today for evaluation of GI bleed in the setting of anticoagulation for mechanical heart valve.  She reports that she started having hematochezia on 2/6.  She stopped her Coumadin at that time.  Reports bleeding stopped on 2/7.  Her INR on 2/9 was 4.7.  She presented to the ED, and was found to have a hemoglobin of 5.6.  She was given 2 units PRBCs with improvement in hemoglobin to 8.  GI consulted for further evaluation.  She reports that she has a history of recurrent GI bleeds.  Past EGD/colonoscopies have been unrevealing, but capsule endoscopy revealed small bowel AVMs that are likely the source of bleeding.  She reports that she has not had any bleeding episodes when INR is therapeutic, but has only occurred when she has supratherapeutic INR.  Her goal INR range is 2-2.5 given her history of GI bleeding.  GI planning small bowel enteroscopy.  Agree with holding her Coumadin and letting INR drift down.  Once INR is less than 2, would start heparin drip.  Donato Heinz, MD

## 2019-11-04 NOTE — Progress Notes (Signed)
PROGRESS NOTE    [name]  KNL:[id number] DOB: 1933-01-26 DOA: 11/03/2019 PCP: Gaynelle Arabian, MD     Brief Narrative:  [name] is an 86 year old Caucasian female with past medical history significant for atrial fibrillation, status post mechanical aortic valve replacement, chronic kidney disease stage III, diastolic congestive heart failure with last echocardiogram revealed an ejection fraction of greater than 65%, chronic lower GI bleed, AV malformation, MI, coronary artery disease, diabetes mellitus, hypertension and hyperlipidemia.  Patient is on Coumadin for atrial fibrillation and prostatic aortic heart valve.  On 11/01/2019, INR was 4.7.  Patient's Coumadin has been on hold.  Patient reported significant dark blood per rectum 5 days ago, and has had intermittent rectal bleed since then, though not as significant. On presentation to the hospital, patient's hemoglobin was 5.6 g/dL (baseline of 10.9 g/dL).  Patient admitted for GI bleed and blood transfusion.  GI consulted.  New events last 24 hours / Subjective: Feeling well this morning.  No further GI bleeding to her knowledge.  No complaints  Assessment & Plan:   Active Problems:   Symptomatic anemia   GI bleed with blood loss anemia -Patient with history of small bowel AVM -FOBT positive -Patient transfused 2 unit packed red blood cell on 2/11 -GI consulted -Coumadin on hold  Paroxysmal atrial fibrillation -Continue amiodarone, digoxin -Coumadin on hold -Cardiology consulted  Status post prosthetic aortic valve replacement -Coumadin on hold -Cardiology consulted  Type 2 diabetes -Sliding scale insulin  AKI on CKD stage IIIb-IV -Baseline creatinine 1.5 -Hold Cozaar  Hypertension -Continue Lopressor  CAD -Continue Imdur, hold aspirin  Hyperlipidemia -Continue Lipitor, Zetia     DVT prophylaxis: SCD, Coumadin on hold Code Status: Full code Family Communication: No family at  bedside Disposition Plan:  . Patient is from home prior to admission. . Currently in-hospital treatment needed due to GI bleed and further work-up and recommendation from GI and cardiology. . Suspect patient will discharge home in 2-3 days.    Consultants:   GI  Cardiology  Procedures:   None  Antimicrobials:  Anti-infectives (From admission, onward)   None        Objective: Vitals:   11/03/19 2342 11/04/19 0042 11/04/19 0332 11/04/19 0836  BP: 97/83 (!) 120/56 (!) 107/46 (!) 140/47  Pulse: 66 65 77 69  Resp: 20 19 17 17   Temp: 98.2 F (36.8 C) 98 F (36.7 C) 98.2 F (36.8 C) 98.7 F (37.1 C)  TempSrc: Oral Oral Oral Oral  SpO2: 100% 98% 99% 100%  Weight:      Height:        Intake/Output Summary (Last 24 hours) at 11/04/2019 1110 Last data filed at 11/04/2019 0900 Gross per 24 hour  Intake 645 ml  Output --  Net 645 ml   Filed Weights   11/03/19 1819 11/03/19 2012  Weight: 60.8 kg 63.4 kg    Examination:  General exam: Appears calm and comfortable  Respiratory system: Clear to auscultation. Respiratory effort normal. No respiratory distress. No conversational dyspnea.  Cardiovascular system: S1 & S2 heard, RRR. No murmurs. No pedal edema. + Valvular click Gastrointestinal system: Abdomen is nondistended, soft and nontender. Normal bowel sounds heard. Central nervous system: Alert and oriented. No focal neurological deficits. Speech clear.  Extremities: Symmetric in appearance  Skin: No rashes, lesions or ulcers on exposed skin  Psychiatry: Judgement and insight appear normal. Mood & affect appropriate.   Data Reviewed: I have personally reviewed following labs and imaging studies  CBC: Recent Labs  Lab 11/03/19 1539 11/04/19 0337 11/04/19 1029  WBC 7.5  --   --   HGB 5.6* 8.0* 8.5*  HCT 19.6* 24.8* 27.5*  MCV 108.9*  --   --   PLT 206  --   --    Basic Metabolic Panel: Recent Labs  Lab 11/03/19 1539 11/03/19 1915 11/04/19 0337  NA  133*  --  136  K 3.9  --  3.8  CL 103  --  107  CO2 22  --  23  GLUCOSE 136*  --  92  BUN 49*  --  45*  CREATININE 2.00*  --  1.93*  CALCIUM 8.2*  --  8.1*  MG  --  2.1  --   PHOS  --  3.8  --    GFR: Estimated Creatinine Clearance: 18.1 mL/min (A) (by C-G formula based on SCr of 1.93 mg/dL (H)). Liver Function Tests: Recent Labs  Lab 11/03/19 1539  AST 30  ALT 30  ALKPHOS 32*  BILITOT 0.8  PROT 5.3*  ALBUMIN 3.2*   No results for input(s): LIPASE, AMYLASE in the last 168 hours. No results for input(s): AMMONIA in the last 168 hours. Coagulation Profile: Recent Labs  Lab 11/01/19 1202 11/03/19 1651  INR 4.7* 3.1*   Cardiac Enzymes: No results for input(s): CKTOTAL, CKMB, CKMBINDEX, TROPONINI in the last 168 hours. BNP (last 3 results) No results for input(s): PROBNP in the last 8760 hours. HbA1C: No results for input(s): HGBA1C in the last 72 hours. CBG: Recent Labs  Lab 11/03/19 2340 11/04/19 0551  GLUCAP 118* 96   Lipid Profile: No results for input(s): CHOL, HDL, LDLCALC, TRIG, CHOLHDL, LDLDIRECT in the last 72 hours. Thyroid Function Tests: No results for input(s): TSH, T4TOTAL, FREET4, T3FREE, THYROIDAB in the last 72 hours. Anemia Panel: No results for input(s): VITAMINB12, FOLATE, FERRITIN, TIBC, IRON, RETICCTPCT in the last 72 hours. Sepsis Labs: No results for input(s): PROCALCITON, LATICACIDVEN in the last 168 hours.  Recent Results (from the past 240 hour(s))  SARS CORONAVIRUS 2 (TAT 6-24 HRS) Nasopharyngeal Nasopharyngeal Swab     Status: None   Collection Time: 11/03/19  5:05 PM   Specimen: Nasopharyngeal Swab  Result Value Ref Range Status   SARS Coronavirus 2 NEGATIVE NEGATIVE Final    Comment: (NOTE) SARS-CoV-2 target nucleic acids are NOT DETECTED. The SARS-CoV-2 RNA is generally detectable in upper and lower respiratory specimens during the acute phase of infection. Negative results do not preclude SARS-CoV-2 infection, do not rule  out co-infections with other pathogens, and should not be used as the sole basis for treatment or other patient management decisions. Negative results must be combined with clinical observations, patient history, and epidemiological information. The expected result is Negative. Fact Sheet for Patients: SugarRoll.be Fact Sheet for Healthcare Providers: https://www.woods-mathews.com/ This test is not yet approved or cleared by the [place] FDA and  has been authorized for detection and/or diagnosis of SARS-CoV-2 by FDA under an Emergency Use Authorization (EUA). This EUA will remain  in effect (meaning this test can be used) for the duration of the COVID-19 declaration under Section 56 4(b)(1) of the Act, 21 U.S.C. section 360bbb-3(b)(1), unless the authorization is terminated or revoked sooner. Performed at [facility], [place] [address], [place], [place] [address]   MRSA PCR Screening     Status: None   Collection Time: 11/03/19  8:11 PM   Specimen: Nasopharyngeal  Result Value Ref Range Status   MRSA  by PCR NEGATIVE NEGATIVE Final    Comment:        The GeneXpert MRSA Assay (FDA approved for NASAL specimens only), is one component of a comprehensive MRSA colonization surveillance program. It is not intended to diagnose MRSA infection nor to guide or monitor treatment for MRSA infections. Performed at [facility], [place] [address], [place], [place] [address]       Radiology Studies: DG Chest Portable 1 View  Result Date: 11/03/2019 CLINICAL DATA:  Weakness and shortness of breath. EXAM: PORTABLE CHEST 1 VIEW COMPARISON:  Radiograph 11/27/2018 FINDINGS: Stable mild cardiomegaly post CABG. Possible trace pleural effusions. No pulmonary edema. No focal airspace disease. No pneumothorax. No acute osseous abnormalities are seen. IMPRESSION: 1. Possible trace pleural effusions. 2. Stable cardiomegaly post CABG. Aortic  Atherosclerosis (ICD10-I70.0). Electronically Signed   By: Keith Rake M.D.   On: 11/03/2019 16:51      Scheduled Meds: . amiodarone  200 mg Oral Daily  . atorvastatin  80 mg Oral Daily  . isosorbide mononitrate  120 mg Oral Daily  . metoprolol tartrate  25 mg Oral Daily   Continuous Infusions: . sodium chloride    . sodium chloride       LOS: 0 days      Time spent: 40 minutes   Dessa Phi, DO [facility] 11/04/2019, 11:10 AM   Available via Epic secure chat 7am-7pm After these hours, please refer to coverage provider listed on [email or web address]

## 2019-11-04 NOTE — Consult Note (Addendum)
Referring Provider:  [facility]         Primary Care Physician:  Gaynelle Arabian, MD Primary Gastroenterologist: Previously Dr. Olevia Perches            We were asked to see this patient for:   Anemia               ASSESSMENT /  PLAN    86 yo female with multiple medical problems not limited to atrial fibrillation on Amiodarone / digoxin, mechanical AVR on chronic Coumadin, CKD 3, diastolic CHF, diabetes, CAD, hypertension, small bowel AVMs.   75.  86 year old female with recurrent anemia (currently macrocytic), dark but not black heme positive stool and one episode of frank rectal bleeding on Saturday.  On chronic Coumadin, INR 3.1 but slightly supratherapeutic 3 days ago ata 4.7.  Patient has a history of small bowel AVMs and this could be the source of bleeding/anemia.  Other etiologies such as PUD, neoplasm, etc.. not excluded but seems less likely.  -Suspect she will need small bowel enteroscopy when INR reversed and/or drifts downward.  -Hopefully given advanced age and comorbidities colonoscopy will not be necessary -She is microcytic, check vitamin B12 and folate levels -Hemoglobin improved to 8 following 2 units of blood  2. Elevated troponin. Demand ischemia?  3. AKI on CKD , improving  HPI:    Chief Complaint: weakness, rectal bleeding x1  [name] is a 86 y.o. female with multiple medical problems not limited to atrial fibrillation, mechanical AVR on chronic Coumadin, CKD 3, and diastolic congestive heart failure, diabetes, CAD, hypertension, small bowel AVMs.  Patient previously followed by Dr. Olevia Perches in our practice.  Through the years she has been evaluated for GI bleeding/anemia/heme positive stools but has not been seen in many years.  She is on chronic iron at home.  She is on Coumadin plus a daily baby aspirin.  No other NSAIDs.  INR 3.1. Patient presented to the ED yesterday with weakness.  Hemoglobin found to be 5.6, down from baseline of 10.9.  On Saturday  she had red blood in stool, since then stools have been darker than normal though not black. She denies abdominal pain.  She did have one episode of nausea and vomiting of nonbloody emesis within the last few days .   Past Medical History:  Diagnosis Date  . Anemia   . Anginal pain ([place])   . Arteriovenous malformation of gastrointestinal tract   . Arthritis    "fingers mostly" (04/12/2015)  . Carotid artery disease ([place])    Carotid US 3/17:  36-46% RICA; 80-32% LICA; Elevated bilateral subclavian artery velocities >>f/u 1 year. // Carotid US 4/18: R 40-59; L 1-39 >> FU 1 year // Carotid US 10/2018: R 40-59; L 1-39, L subclavian stenosis   . CHF (congestive heart failure) ([place])   . Chronic kidney disease (CKD), stage III (moderate)   . Chronic lower GI bleeding    "today; last time was ~ 8 yr ago; used to have them often before that too" (02/11/2013)  . Coronary artery disease   . DJD (degenerative joint disease)   . Dysrhythmia   . Heart murmur   . History of blood transfusion    "a few times over the years; usually related to my Coumadin" (04/12/2015  . History of gout   . Hyperlipemia   . Hypertension   . Macula lutea degeneration   . Old MI (myocardial infarction)    "a coulple /dr in 02/2008;  I never even knew I'd had them" (04/12/2015)  . Type II diabetes mellitus ([place])     Past Surgical History:  Procedure Laterality Date  . AORTIC VALVE REPLACEMENT  1993  . APPENDECTOMY  1953  . CARDIAC CATHETERIZATION  ~1990  . CARDIAC CATHETERIZATION  04/12/2015   Procedure: Coronary Stent Intervention;  Surgeon: Jettie Booze, MD;    SYNERGY DES 3.5X16 to the ostial RCA   . CARDIAC CATHETERIZATION  04/12/2015   Procedure: Coronary/Graft Angiography;  Surgeon: Eloy End, MD; LAD & CFX 100%, patent LIMA-LAD, SVG-D1; SVG-OM-PDA first limb 100%, 2nd limb patent; oRCA 75%>0 w/ stent  . [place]   St. Jude/notes 10/29/2003 (02/11/2013)  . CARDIOVERSION N/A  08/15/2019   Procedure: CARDIOVERSION;  Surgeon: Acie Fredrickson Wonda Cheng, MD;  Location: [facility] ENDOSCOPY;  Service: Cardiovascular;  Laterality: N/A;  . COLONOSCOPY N/A 02/13/2013   Procedure: COLONOSCOPY;  Surgeon: Lafayette Dragon, MD;  Location: [facility] ENDOSCOPY;  Service: Endoscopy;  Laterality: N/A;  . CORONARY ANGIOPLASTY WITH STENT PLACEMENT  2009+   "3 at least; put in 1 stent at a time" (02/11/2013)  . CORONARY ARTERY BYPASS GRAFT  1990   LIMA-LAD, SVG-OM-PDA, SVG-D1  . DILATION AND CURETTAGE OF UTERUS  1960's   'after a miscarriage" (02/11/2013)  . ENTEROSCOPY N/A 02/13/2013   Procedure: ENTEROSCOPY;  Surgeon: Lafayette Dragon, MD;  Location: [facility] ENDOSCOPY;  Service: Endoscopy;  Laterality: N/A;  . EXPLORATORY LAPAROTOMY  02/24/2008   which revealed a retroperitoneal hematoma and bleeding from the right external iliac artery/notes 03/02/2008 (02/11/2013)   . FLEXIBLE SIGMOIDOSCOPY N/A 04/21/2015   Procedure: FLEXIBLE SIGMOIDOSCOPY;  Surgeon: Ladene Artist, MD;  Location: [facility] ENDOSCOPY;  Service: Endoscopy;  Laterality: N/A;  . TEE WITHOUT CARDIOVERSION N/A 12/01/2018   Procedure: TRANSESOPHAGEAL ECHOCARDIOGRAM (TEE);  Surgeon: Jerline Pain, MD;  Location: [facility] ENDOSCOPY;  Service: Cardiovascular;  Laterality: N/A;    Prior to Admission medications   Medication Sig Start Date End Date Taking? Authorizing Provider  acetaminophen (TYLENOL) 500 MG tablet Take 500 mg by mouth every 6 (six) hours as needed for mild pain or headache.   Yes [provider]  amiodarone (PACERONE) 200 MG tablet Take 1 tablet (200 mg total) by mouth daily. 09/06/19  Yes Imogene Burn, PA-C  aspirin EC 81 MG tablet Take 1 tablet (81 mg total) by mouth daily. 04/17/16  Yes Jettie Booze, MD  atorvastatin (LIPITOR) 80 MG tablet Take 1 tablet by mouth once daily Patient taking differently: Take 80 mg by mouth at bedtime.  09/19/19  Yes Jettie Booze, MD  bevacizumab (AVASTIN) 1.25 mg/0.1 mL SOLN Place 1.25 mg into  the left eye every 8 (eight) weeks.   Yes [provider]  digoxin (LANOXIN) 0.125 MG tablet Take 0.125 mg by mouth daily.   Yes [provider]  ezetimibe (ZETIA) 10 MG tablet TAKE 1 TABLET BY MOUTH ONCE DAILY PLEASE  MAKE  OVERDUE  APPOINTMENT  WITH  DR  Irish Lack  BEFORE  ANYMORE  REFILLS Patient taking differently: Take 10 mg by mouth at bedtime.  07/06/19  Yes Jettie Booze, MD  FeFum-FePo-FA-B Cmp-C-Zn-Mn-Cu (SE-TAN PLUS) 162-115.2-1 MG CAPS Take 1 capsule by mouth 2 (two) times daily.  05/22/15  Yes [provider]  folic acid (FOLVITE) 132 MCG tablet Take 400 mcg by mouth every evening.    Yes [provider]  furosemide (LASIX) 20 MG tablet Take 1 tablet (20 mg total) by mouth  daily. You may take an extra 20 mg as needed for 2-3 lb weight gain overnight Patient taking differently: Take 20 mg by mouth See admin instructions. Take 20 mg by mouth in the morning and and additional 20 mg as needed for an overnight weight gain of 2-3 pounds 08/23/19  Yes Imogene Burn, PA-C  isosorbide mononitrate (IMDUR) 60 MG 24 hr tablet Take 1 tablet (60 mg total) by mouth daily. 07/07/19  Yes Varanasi, Charlann Lange, MD  JANTOVEN 5 MG tablet TAKE 1/2 TO 1 (ONE-HALF TO ONE) TABLET BY MOUTH ONCE DAILY AS  DIRECTED  BY  COUMADIN  CLINIC Patient taking differently: Take 2.5-5 mg by mouth See admin instructions. Take 2.5 mg by mouth at bedtime on Sun/Wed/Fri and 5 mg on Mon/Tues/Thurs/Sat 10/25/19  Yes Jettie Booze, MD  JANUVIA 50 MG tablet Take 50 mg by mouth daily.  06/14/19  Yes [provider]  losartan (COZAAR) 50 MG tablet Take 50 mg by mouth daily.   Yes [provider]  metoprolol tartrate (LOPRESSOR) 25 MG tablet Take 1 tablet (25 mg total) by mouth daily. Patient taking differently: Take 25 mg by mouth 2 (two) times daily.  07/19/19  Yes Imogene Burn, PA-C  Multiple Vitamin (MULTIVITAMIN WITH MINERALS) TABS Take 1 tablet by mouth daily.    Yes [provider]  Multiple Vitamins-Minerals (PRESERVISION AREDS 2 PO) Take 1 tablet by mouth 2 (two) times daily.    Yes [provider]  nitroGLYCERIN (NITROSTAT) 0.4 MG SL tablet Place 1 tablet (0.4 mg total) under the tongue every 5 (five) minutes as needed for chest pain. Please make appt for January. 1st attempt Patient taking differently: Place 0.4 mg under the tongue every 5 (five) minutes as needed for chest pain.  09/07/17  Yes Jettie Booze, MD  Omega-3 Fatty Acids (FISH OIL PO) Take 1 capsule by mouth in the morning and at bedtime.   Yes [provider]  potassium chloride (KLOR-CON) 10 MEQ tablet Take 10 mEq by mouth 2 (two) times daily.   Yes [provider]    Current Facility-Administered Medications  Medication Dose Route Frequency Provider Last Rate Last Admin  . 0.9 %  sodium chloride infusion  10 mL/hr Intravenous Once Henderly, Britni A, PA-C      . amiodarone (PACERONE) tablet 200 mg  200 mg Oral Daily Dana Allan I, MD   200 mg at 11/04/19 0906  . atorvastatin (LIPITOR) tablet 80 mg  80 mg Oral Daily Dana Allan I, MD   80 mg at 11/04/19 0905  . isosorbide mononitrate (IMDUR) 24 hr tablet 120 mg  120 mg Oral Daily Dana Allan I, MD   120 mg at 11/04/19 0905  . metoprolol tartrate (LOPRESSOR) tablet 25 mg  25 mg Oral Daily Dana Allan I, MD   25 mg at 11/04/19 0905  . sodium chloride 0.9 % bolus 500 mL  500 mL Intravenous Once Henderly, Britni A, PA-C        Allergies as of 11/03/2019 - Review Complete 11/03/2019  Allergen Reaction Noted  . Darvon [propoxyphene] Nausea And Vomiting 11/28/2013  . Lisinopril Cough 11/28/2013  . Septra [sulfamethoxazole-trimethoprim] Other (See Comments) 11/28/2013  . Warfarin and related Other (See Comments) 04/15/2015  . Penicillins Rash 04/10/2012    Family History  Problem Relation Age of Onset  . Heart disease Mother   . Heart disease Father     Social  History   Socioeconomic History  . Marital  status: Married    Spouse name: Not on file  . Number of children: Not on file  . Years of education: Not on file  . Highest education level: Not on file  Occupational History  . Not on file  Tobacco Use  . Smoking status: Never Smoker  . Smokeless tobacco: Never Used  Substance and Sexual Activity  . Alcohol use: No  . Drug use: No  . Sexual activity: Yes  Other Topics Concern  . Not on file  Social History Narrative  . Not on file   Social Determinants of Health   Financial Resource Strain:   . Difficulty of Paying Living Expenses: Not on file  Food Insecurity:   . Worried About Charity fundraiser in the Last Year: Not on file  . Ran Out of Food in the Last Year: Not on file  Transportation Needs:   . Lack of Transportation (Medical): Not on file  . Lack of Transportation (Non-Medical): Not on file  Physical Activity:   . Days of Exercise per Week: Not on file  . Minutes of Exercise per Session: Not on file  Stress:   . Feeling of Stress : Not on file  Social Connections:   . Frequency of Communication with Friends and Family: Not on file  . Frequency of Social Gatherings with Friends and Family: Not on file  . Attends Religious Services: Not on file  . Active Member of Clubs or Organizations: Not on file  . Attends Archivist Meetings: Not on file  . Marital Status: Not on file  Intimate Partner Violence:   . Fear of Current or Ex-Partner: Not on file  . Emotionally Abused: Not on file  . Physically Abused: Not on file  . Sexually Abused: Not on file    Review of Systems: All systems reviewed and negative except where noted in HPI.  Physical Exam: Vital signs in last 24 hours: Temp:  [97.8 F (36.6 C)-98.7 F (37.1 C)] 98.7 F (37.1 C) (02/12 0836) Pulse Rate:  [53-77] 69 (02/12 0836) Resp:  [15-22] 17 (02/12 0836) BP: (97-149)/(44-83) 140/47 (02/12 0836) SpO2:  [97 %-100 %] 100 % (02/12  0836) Weight:  [60.8 kg-63.4 kg] 63.4 kg (02/11 2012) Last BM Date: 11/02/19 General:   Alert, well-developed,  female in NAD Psych:  Pleasant, cooperative. Normal mood and affect. Eyes:  Pupils equal, sclera clear, no icterus.   Conjunctiva pink. Ears:  Normal auditory acuity. Nose:  No deformity, discharge,  or lesions. Neck:  Supple; no masses Lungs:  Clear throughout to auscultation.   No wheezes, crackles, or rhonchi.  Heart:  Regular rate and rhythm; soft murmurs, valve click, 1+ bilateral pedal edema Abdomen:  Soft, non-distended, nontender, BS active, no palp mass   Rectal:  Deferred  Msk:  Symmetrical without gross deformities. . Neurologic:  Alert and  oriented x4;  grossly normal neurologically. Skin:  Intact without significant lesions or rashes.   Intake/Output from previous day: 02/11 0701 - 02/12 0700 In: 615 [Blood:615] Out: -  Intake/Output this shift: Total I/O In: 30 [P.O.:30] Out: -   Lab Results: Recent Labs    11/03/19 1539 11/04/19 0337  WBC 7.5  --   HGB 5.6* 8.0*  HCT 19.6* 24.8*  PLT 206  --    BMET Recent Labs    11/03/19 1539 11/04/19 0337  NA 133* 136  K 3.9 3.8  CL 103 107  CO2 22 23  GLUCOSE 136* 92  BUN  49* 45*  CREATININE 2.00* 1.93*  CALCIUM 8.2* 8.1*   LFT Recent Labs    11/03/19 1539  PROT 5.3*  ALBUMIN 3.2*  AST 30  ALT 30  ALKPHOS 32*  BILITOT 0.8   PT/INR Recent Labs    11/01/19 1202 11/03/19 1651  LABPROT  --  31.6*  INR 4.7* 3.1*   Hepatitis Panel No results for input(s): HEPBSAG, HCVAB, HEPAIGM, HEPBIGM in the last 72 hours.   . CBC Latest Ref Rng & Units 11/04/2019 11/03/2019 08/12/2019  WBC 4.0 - 10.5 K/uL - 7.5 7.9  Hemoglobin 12.0 - 15.0 g/dL 8.0(L) 5.6(LL) 10.9(L)  Hematocrit 36.0 - 46.0 % 24.8(L) 19.6(L) 33.0(L)  Platelets 150 - 400 K/uL - 206 228    . CMP Latest Ref Rng & Units 11/04/2019 11/03/2019 09/27/2019  Glucose 70 - 99 mg/dL 92 136(H) 102(H)  BUN 8 - 23 mg/dL 45(H) 49(H) 38(H)   Creatinine 0.44 - 1.00 mg/dL 1.93(H) 2.00(H) 1.50(H)  Sodium 135 - 145 mmol/L 136 133(L) 140  Potassium 3.5 - 5.1 mmol/L 3.8 3.9 4.9  Chloride 98 - 111 mmol/L 107 103 104  CO2 22 - 32 mmol/L 23 22 24   Calcium 8.9 - 10.3 mg/dL 8.1(L) 8.2(L) 9.4  Total Protein 6.5 - 8.1 g/dL - 5.3(L) -  Total Bilirubin 0.3 - 1.2 mg/dL - 0.8 -  Alkaline Phos 38 - 126 U/L - 32(L) -  AST 15 - 41 U/L - 30 -  ALT 0 - 44 U/L - 30 -   Studies/Results: DG Chest Portable 1 View  Result Date: 11/03/2019 CLINICAL DATA:  Weakness and shortness of breath. EXAM: PORTABLE CHEST 1 VIEW COMPARISON:  Radiograph 11/27/2018 FINDINGS: Stable mild cardiomegaly post CABG. Possible trace pleural effusions. No pulmonary edema. No focal airspace disease. No pneumothorax. No acute osseous abnormalities are seen. IMPRESSION: 1. Possible trace pleural effusions. 2. Stable cardiomegaly post CABG. Aortic Atherosclerosis (ICD10-I70.0). Electronically Signed   By: Keith Rake M.D.   On: 11/03/2019 16:51    Active Problems:   Symptomatic anemia    Tye Savoy, NP-C @  11/04/2019, 9:14 AM   I have reviewed the entire case in detail with the above APP and discussed the plan in detail.  Therefore, I agree with the diagnoses recorded above. In addition,  I have personally interviewed and examined the patient and have personally reviewed any abdominal/pelvic CT scan images.  I reviewed previous endoscopy and colonoscopy reports by Dr. Olevia Perches as well.   My additional thoughts are as follows:  This is the patient's first bleeding episode since events of 2014.  As such, it is difficult to determine where the bleeding source may be.  She and her husband describe it as "fresh blood" that may have been the room, then turned to black stool.  She denies abdominal pain, no hematemesis.  According to them, because of previous history of bleeding, her cardiology team keeps her INR between 2 and 2-1/2 rather than the usual 2-1/2-3-1/2 with a  mechanical valve.  2 days ago it was 4.7 on outpatient check, then 3.1 today (presumably after holding Coumadin for some time)  With what we know of her, this seems most likely to be small bowel AVM bleeding.  Perhaps a new source has developed in the stomach, proximal small bowel or colon.  Given the degree of bleeding and severity of anemia, I recommend an endoscopic work-up.  Since I am most suspicious of small bowel AVMs, we will start with a small bowel enteroscopy.  That we will need to wait until her INR is below 2 in case endoscopic hemostasis is necessary.  Therefore, she will need to be on unfractionated heparin when her INR gets below anticoagulation threshold determined by cardiology or internal medicine service for her mechanical valve.  Since I do not expect that to be tomorrow, I have ordered her regular diet.  We will start a clear liquid diet with supper tomorrow in case enteroscopy necessary the following day.  I will follow her over the weekend I spoke with them at length, and all their questions answered. Nelida Meuse III [phone]

## 2019-11-05 DIAGNOSIS — D649 Anemia, unspecified: Secondary | ICD-10-CM

## 2019-11-05 DIAGNOSIS — Z952 Presence of prosthetic heart valve: Secondary | ICD-10-CM

## 2019-11-05 DIAGNOSIS — I25118 Atherosclerotic heart disease of native coronary artery with other forms of angina pectoris: Secondary | ICD-10-CM

## 2019-11-05 DIAGNOSIS — K922 Gastrointestinal hemorrhage, unspecified: Secondary | ICD-10-CM

## 2019-11-05 DIAGNOSIS — I480 Paroxysmal atrial fibrillation: Secondary | ICD-10-CM

## 2019-11-05 LAB — BASIC METABOLIC PANEL
Anion gap: 9 (ref 5–15)
BUN: 36 mg/dL — ABNORMAL HIGH (ref 8–23)
CO2: 22 mmol/L (ref 22–32)
Calcium: 8.2 mg/dL — ABNORMAL LOW (ref 8.9–10.3)
Chloride: 109 mmol/L (ref 98–111)
Creatinine, Ser: 1.64 mg/dL — ABNORMAL HIGH (ref 0.44–1.00)
GFR calc Af Amer: 33 mL/min — ABNORMAL LOW (ref >60)
GFR calc non Af Amer: 28 mL/min — ABNORMAL LOW (ref >60)
Glucose, Bld: 97 mg/dL (ref 70–99)
Potassium: 3.7 mmol/L (ref 3.5–5.1)
Sodium: 140 mmol/L (ref 135–145)

## 2019-11-05 LAB — CBC
HCT: 24.8 % — ABNORMAL LOW (ref 36.0–46.0)
Hemoglobin: 7.8 g/dL — ABNORMAL LOW (ref 12.0–15.0)
MCH: 31.2 pg (ref 26.0–34.0)
MCHC: 31.5 g/dL (ref 30.0–36.0)
MCV: 99.2 fL (ref 80.0–100.0)
Platelets: 148 10*3/uL — ABNORMAL LOW (ref 150–400)
RBC: 2.5 MIL/uL — ABNORMAL LOW (ref 3.87–5.11)
RDW: 22.3 % — ABNORMAL HIGH (ref 11.5–15.5)
WBC: 5.6 10*3/uL (ref 4.0–10.5)
nRBC: 0 % (ref 0.0–0.2)

## 2019-11-05 LAB — GLUCOSE, CAPILLARY
Glucose-Capillary: 100 mg/dL — ABNORMAL HIGH (ref 70–99)
Glucose-Capillary: 106 mg/dL — ABNORMAL HIGH (ref 70–99)
Glucose-Capillary: 114 mg/dL — ABNORMAL HIGH (ref 70–99)
Glucose-Capillary: 144 mg/dL — ABNORMAL HIGH (ref 70–99)
Glucose-Capillary: 94 mg/dL (ref 70–99)
Glucose-Capillary: 96 mg/dL (ref 70–99)

## 2019-11-05 LAB — PROTIME-INR
INR: 2.3 — ABNORMAL HIGH (ref 0.8–1.2)
Prothrombin Time: 24.9 seconds — ABNORMAL HIGH (ref 11.4–15.2)

## 2019-11-05 MED ORDER — SIMETHICONE 80 MG PO CHEW
80.0000 mg | CHEWABLE_TABLET | Freq: Four times a day (QID) | ORAL | Status: DC | PRN
  Administered 2019-11-05: 80 mg via ORAL
  Filled 2019-11-05: qty 1

## 2019-11-05 MED ORDER — DOCUSATE SODIUM 100 MG PO CAPS
100.0000 mg | ORAL_CAPSULE | Freq: Two times a day (BID) | ORAL | Status: DC | PRN
  Administered 2019-11-05 – 2019-11-06 (×2): 100 mg via ORAL
  Filled 2019-11-05 (×2): qty 1

## 2019-11-05 NOTE — H&P (View-Only) (Signed)
[facility] Progress Note  Chief Complaint: Melena  History:  She has had no bowel movement today, so therefore no bleeding witnessed.  INR was 2.2 last evening, 2.3 this morning, next check is scheduled for tomorrow a.m. Hemoglobin 8.5 yesterday morning, 8.7 last evening, 7.8 this morning.  ROS: Cardiovascular: Denies chest pain Respiratory: Denies dyspnea Urinary: Denies dysuria  Objective:   Current Facility-Administered Medications:  .  0.9 %  sodium chloride infusion, 10 mL/hr, Intravenous, Once, Henderly, Britni A, PA-C .  amiodarone (PACERONE) tablet 200 mg, 200 mg, Oral, Daily, Dana Allan I, MD, 200 mg at 11/05/19 1035 .  atorvastatin (LIPITOR) tablet 80 mg, 80 mg, Oral, Daily, Dana Allan I, MD, 80 mg at 11/05/19 1034 .  docusate sodium (COLACE) capsule 100 mg, 100 mg, Oral, BID PRN, Dessa Phi, DO, 100 mg at 11/05/19 1447 .  ezetimibe (ZETIA) tablet 10 mg, 10 mg, Oral, QHS, Dessa Phi, DO, 10 mg at 11/04/19 2135 .  insulin aspart (novoLOG) injection 0-9 Units, 0-9 Units, Subcutaneous, Q4H, Dessa Phi, DO, 2 Units at 11/04/19 2135 .  isosorbide mononitrate (IMDUR) 24 hr tablet 120 mg, 120 mg, Oral, Daily, Dana Allan I, MD, 120 mg at 11/05/19 1034 .  metoprolol tartrate (LOPRESSOR) tablet 25 mg, 25 mg, Oral, Daily, Dana Allan I, MD, 25 mg at 11/05/19 1034 .  simethicone (MYLICON) chewable tablet 80 mg, 80 mg, Oral, Q6H PRN, Dessa Phi, DO, 80 mg at 11/05/19 1447 .  sodium chloride 0.9 % bolus 500 mL, 500 mL, Intravenous, Once, Henderly, Britni A, PA-C  . sodium chloride    . sodium chloride       Vital signs in last 24 hrs: Vitals:   11/05/19 0902 11/05/19 1637  BP: (!) 131/54 (!) 111/52  Pulse:    Resp: 18 20  Temp: 97.7 F (36.5 C) 97.8 F (36.6 C)  SpO2: 100% 100%   No intake or output data in the 24 hours ending 11/05/19 1833   Physical Exam In no distress, walking around her room without difficulty.  Transfers to  bed easily.  HEENT: sclera anicteric, oral mucosa without lesions  Neck: supple, no thyromegaly, JVD or lymphadenopathy  Cardiac: RRR, metallic valve sound, systolic murmur, no peripheral edema  Pulm: clear to auscultation bilaterally, normal RR and effort noted  Abdomen: soft, no tenderness, with active bowel sounds. No guarding or palpable hepatosplenomegaly  Skin; warm and dry, no jaundice, pale  Recent Labs:  CBC Latest Ref Rng & Units 11/05/2019 11/04/2019 11/04/2019  WBC 4.0 - 10.5 K/uL 5.6 - -  Hemoglobin 12.0 - 15.0 g/dL 7.8(L) 8.7(L) 8.5(L)  Hematocrit 36.0 - 46.0 % 24.8(L) 26.8(L) 27.5(L)  Platelets 150 - 400 K/uL 148(L) - -    Recent Labs  Lab 11/05/19 0420  INR 2.3*   CMP Latest Ref Rng & Units 11/05/2019 11/04/2019 11/03/2019  Glucose 70 - 99 mg/dL 97 92 136(H)  BUN 8 - 23 mg/dL 36(H) 45(H) 49(H)  Creatinine 0.44 - 1.00 mg/dL 1.64(H) 1.93(H) 2.00(H)  Sodium 135 - 145 mmol/L 140 136 133(L)  Potassium 3.5 - 5.1 mmol/L 3.7 3.8 3.9  Chloride 98 - 111 mmol/L 109 107 103  CO2 22 - 32 mmol/L 22 23 22   Calcium 8.9 - 10.3 mg/dL 8.2(L) 8.1(L) 8.2(L)  Total Protein 6.5 - 8.1 g/dL - - 5.3(L)  Total Bilirubin 0.3 - 1.2 mg/dL - - 0.8  Alkaline Phos 38 - 126 U/L - - 32(L)  AST 15 - 41 U/L - - 30  ALT 0 - 44 U/L - - 30     @ASSESSMENTPLANBEGIN @ Assessment: Melena, prehospital. Acute blood loss anemia, most likely on top of anemia chronic kidney disease Chronic kidney disease Long-term use anticoagulation, mechanical AVR.   No overt bleeding since admission. Plan: Tentative plans for small bowel enteroscopy tomorrow if INR less than 2.  When INR below 2, IV heparin will be started.  However, will need to be stopped a few hours prior to the procedure.  She was agreeable to an enteroscopy after discussion of procedure and risks.  The benefits and risks of the planned procedure were described in detail with the patient or (when appropriate) their health care proxy.  Risks  were outlined as including, but not limited to, bleeding, infection, perforation, adverse medication reaction leading to cardiac or pulmonary decompensation, pancreatitis (if ERCP).  The limitation of incomplete mucosal visualization was also discussed.  No guarantees or warranties were given.  Patient at increased risk for cardiopulmonary complications of procedure due to medical comorbidities.   Nelida Meuse III Office: [phone]

## 2019-11-05 NOTE — Progress Notes (Signed)
PROGRESS NOTE    [name]  YKD:[id number] DOB: 11-11-1932 DOA: 11/03/2019 PCP: Gaynelle Arabian, MD     Brief Narrative:  [name] is an 86 year old Caucasian female with past medical history significant for atrial fibrillation, status post mechanical aortic valve replacement, chronic kidney disease stage III, diastolic congestive heart failure with last echocardiogram revealed an ejection fraction of greater than 65%, chronic lower GI bleed, AV malformation, MI, coronary artery disease, diabetes mellitus, hypertension and hyperlipidemia.  Patient is on Coumadin for atrial fibrillation and prostatic aortic heart valve.  On 11/01/2019, INR was 4.7.  Patient's Coumadin has been on hold.  Patient reported significant dark blood per rectum 5 days ago, and has had intermittent rectal bleed since then, though not as significant. On presentation to the hospital, patient's hemoglobin was 5.6 g/dL (baseline of 10.9 g/dL).  Patient admitted for GI bleed and blood transfusion.  GI consulted.  New events last 24 hours / Subjective: Sitting in chair, no complaints. Has not had any further BM or bleeding, no lightheadedness or dizziness, no abdominal pain or nausea, vomiting. Feeling well overall.   Assessment & Plan:   Active Problems:   Symptomatic anemia   Acute GI bleeding   GI bleed with blood loss anemia -Patient with history of small bowel AVM -FOBT positive -Patient transfused 2 unit packed red blood cell on 2/11 -GI following, planning for small bowel enteroscopy 2/14  -Coumadin on hold. Will need INR < 2 prior to procedure and IV heparin once INR < 2  -Trend CBC   Paroxysmal atrial fibrillation -Continue amiodarone, digoxin -Coumadin on hold. Goal INR has been 2-2.5 as outpatient.  -Cardiology following   Status post prosthetic aortic valve replacement -Coumadin on hold. Goal INR has been 2-2.5 as outpatient.  -Cardiology following   Type 2 diabetes -Sliding scale  insulin  AKI on CKD stage IIIb-IV -Baseline creatinine 1.5 -Hold Cozaar -Stable Cr 1.64 today   Hypertension -Continue Lopressor  CAD -Continue Imdur, hold aspirin  Hyperlipidemia -Continue Lipitor, Zetia     DVT prophylaxis: SCD, Coumadin on hold Code Status: Full code Family Communication: No family at bedside Disposition Plan:  . Patient is from home prior to admission. . Currently in-hospital treatment needed due to GI bleed and further work-up and recommendation from GI and cardiology. Small bowel enteroscopy planned 2/14  . Suspect patient will discharge home in 2-3 days.    Consultants:   GI  Cardiology  Procedures:   None  Antimicrobials:  Anti-infectives (From admission, onward)   None       Objective: Vitals:   11/04/19 1222 11/04/19 1701 11/04/19 2237 11/05/19 0902  BP: (!) 140/55 (!) 116/46 (!) 118/54 (!) 131/54  Pulse: 64  82   Resp: 20   18  Temp: 98.7 F (37.1 C) 97.7 F (36.5 C) 98.4 F (36.9 C) 97.7 F (36.5 C)  TempSrc: Oral Oral Oral Oral  SpO2: 98%  100% 100%  Weight:      Height:        Intake/Output Summary (Last 24 hours) at 11/05/2019 0934 Last data filed at 11/04/2019 1800 Gross per 24 hour  Intake 480 ml  Output --  Net 480 ml   Filed Weights   11/03/19 1819 11/03/19 2012  Weight: 60.8 kg 63.4 kg    Examination: General exam: Appears calm and comfortable  Respiratory system: Clear to auscultation. Respiratory effort normal. Cardiovascular system: S1 & S2 heard, +valvular click. No pedal edema. Gastrointestinal system: Abdomen is  nondistended, soft and nontender. Normal bowel sounds heard. Central nervous system: Alert and oriented. Non focal exam. Speech clear  Extremities: Symmetric in appearance bilaterally  Skin: No rashes, lesions or ulcers on exposed skin  Psychiatry: Judgement and insight appear stable. Mood & affect appropriate.    Data Reviewed: I have personally reviewed following labs and imaging  studies  CBC: Recent Labs  Lab 11/03/19 1539 11/04/19 0337 11/04/19 1029 11/04/19 1907 11/05/19 0420  WBC 7.5  --   --   --  5.6  HGB 5.6* 8.0* 8.5* 8.7* 7.8*  HCT 19.6* 24.8* 27.5* 26.8* 24.8*  MCV 108.9*  --   --   --  99.2  PLT 206  --   --   --  527*   Basic Metabolic Panel: Recent Labs  Lab 11/03/19 1539 11/03/19 1915 11/04/19 0337 11/05/19 0420  NA 133*  --  136 140  K 3.9  --  3.8 3.7  CL 103  --  107 109  CO2 22  --  23 22  GLUCOSE 136*  --  92 97  BUN 49*  --  45* 36*  CREATININE 2.00*  --  1.93* 1.64*  CALCIUM 8.2*  --  8.1* 8.2*  MG  --  2.1  --   --   PHOS  --  3.8  --   --    GFR: Estimated Creatinine Clearance: 21.2 mL/min (A) (by C-G formula based on SCr of 1.64 mg/dL (H)). Liver Function Tests: Recent Labs  Lab 11/03/19 1539  AST 30  ALT 30  ALKPHOS 32*  BILITOT 0.8  PROT 5.3*  ALBUMIN 3.2*   No results for input(s): LIPASE, AMYLASE in the last 168 hours. No results for input(s): AMMONIA in the last 168 hours. Coagulation Profile: Recent Labs  Lab 11/01/19 1202 11/03/19 1651 11/04/19 1907 11/05/19 0420  INR 4.7* 3.1* 2.2* 2.3*   Cardiac Enzymes: No results for input(s): CKTOTAL, CKMB, CKMBINDEX, TROPONINI in the last 168 hours. BNP (last 3 results) No results for input(s): PROBNP in the last 8760 hours. HbA1C: No results for input(s): HGBA1C in the last 72 hours. CBG: Recent Labs  Lab 11/04/19 1659 11/04/19 2114 11/05/19 0002 11/05/19 0334 11/05/19 0750  GLUCAP 124* 176* 100* 96 94   Lipid Profile: No results for input(s): CHOL, HDL, LDLCALC, TRIG, CHOLHDL, LDLDIRECT in the last 72 hours. Thyroid Function Tests: No results for input(s): TSH, T4TOTAL, FREET4, T3FREE, THYROIDAB in the last 72 hours. Anemia Panel: No results for input(s): VITAMINB12, FOLATE, FERRITIN, TIBC, IRON, RETICCTPCT in the last 72 hours. Sepsis Labs: No results for input(s): PROCALCITON, LATICACIDVEN in the last 168 hours.  Recent Results (from  the past 240 hour(s))  SARS CORONAVIRUS 2 (TAT 6-24 HRS) Nasopharyngeal Nasopharyngeal Swab     Status: None   Collection Time: 11/03/19  5:05 PM   Specimen: Nasopharyngeal Swab  Result Value Ref Range Status   SARS Coronavirus 2 NEGATIVE NEGATIVE Final    Comment: (NOTE) SARS-CoV-2 target nucleic acids are NOT DETECTED. The SARS-CoV-2 RNA is generally detectable in upper and lower respiratory specimens during the acute phase of infection. Negative results do not preclude SARS-CoV-2 infection, do not rule out co-infections with other pathogens, and should not be used as the sole basis for treatment or other patient management decisions. Negative results must be combined with clinical observations, patient history, and epidemiological information. The expected result is Negative. Fact Sheet for Patients: SugarRoll.be Fact Sheet for Healthcare Providers: https://www.woods-mathews.com/ This test is not  yet approved or cleared by the [place] and  has been authorized for detection and/or diagnosis of SARS-CoV-2 by FDA under an Emergency Use Authorization (EUA). This EUA will remain  in effect (meaning this test can be used) for the duration of the COVID-19 declaration under Section 56 4(b)(1) of the Act, 21 U.S.C. section 360bbb-3(b)(1), unless the authorization is terminated or revoked sooner. Performed at [facility], [place] [address], [place], [place] [address]   MRSA PCR Screening     Status: None   Collection Time: 11/03/19  8:11 PM   Specimen: Nasopharyngeal  Result Value Ref Range Status   MRSA by PCR NEGATIVE NEGATIVE Final    Comment:        The GeneXpert MRSA Assay (FDA approved for NASAL specimens only), is one component of a comprehensive MRSA colonization surveillance program. It is not intended to diagnose MRSA infection nor to guide or monitor treatment for MRSA infections. Performed at [facility], [place] [address], [place], [place] [address]       Radiology Studies: DG Chest Portable 1 View  Result Date: 11/03/2019 CLINICAL DATA:  Weakness and shortness of breath. EXAM: PORTABLE CHEST 1 VIEW COMPARISON:  Radiograph 11/27/2018 FINDINGS: Stable mild cardiomegaly post CABG. Possible trace pleural effusions. No pulmonary edema. No focal airspace disease. No pneumothorax. No acute osseous abnormalities are seen. IMPRESSION: 1. Possible trace pleural effusions. 2. Stable cardiomegaly post CABG. Aortic Atherosclerosis (ICD10-I70.0). Electronically Signed   By: Keith Rake M.D.   On: 11/03/2019 16:51      Scheduled Meds: . amiodarone  200 mg Oral Daily  . atorvastatin  80 mg Oral Daily  . ezetimibe  10 mg Oral QHS  . insulin aspart  0-9 Units Subcutaneous Q4H  . isosorbide mononitrate  120 mg Oral Daily  . metoprolol tartrate  25 mg Oral Daily   Continuous Infusions: . sodium chloride    . sodium chloride       LOS: 1 day      Time spent: 25 minutes   Dessa Phi, DO [facility] 11/05/2019, 9:34 AM   Available via Epic secure chat 7am-7pm After these hours, please refer to coverage provider listed on [email or web address]

## 2019-11-05 NOTE — Plan of Care (Signed)
  Problem: Health Behavior/Discharge Planning: Goal: Ability to manage health-related needs will improve Outcome: Progressing   

## 2019-11-05 NOTE — Progress Notes (Signed)
ANTICOAGULATION CONSULT NOTE - Initial Consult  Pharmacy Consult for heparin  Indication: mechanical AVR  Allergies  Allergen Reactions  . Darvon [Propoxyphene] Nausea And Vomiting  . Lisinopril Cough  . Septra [Sulfamethoxazole-Trimethoprim] Other (See Comments)    Increased INR  . Warfarin And Related Other (See Comments)    ONLY TOLERATES BRAND  . Penicillins Rash    Did it involve swelling of the face/tongue/throat, SOB, or low BP? No Did it involve sudden or severe rash/hives, skin peeling, or any reaction on the inside of your mouth or nose? No Did you need to seek medical attention at a hospital or doctor's office? No When did it last happen?15 years If all above answers are "NO", may proceed with cephalosporin use.    Patient Measurements: Height: 5' (152.4 cm) Weight: 139 lb 12.4 oz (63.4 kg) IBW/kg (Calculated) : 45.5   Vital Signs: Temp: 97.7 F (36.5 C) (02/13 0902) Temp Source: Oral (02/13 0902) BP: 131/54 (02/13 0902)  Labs: Recent Labs    11/03/19 1539 11/03/19 1539 11/03/19 1651 11/04/19 0337 11/04/19 0337 11/04/19 0453 11/04/19 1029 11/04/19 1029 11/04/19 1907 11/05/19 0420  HGB 5.6*   < >  --  8.0*   < >  --  8.5*   < > 8.7* 7.8*  HCT 19.6*   < >  --  24.8*   < >  --  27.5*  --  26.8* 24.8*  PLT 206  --   --   --   --   --   --   --   --  148*  APTT  --   --  36  --   --   --   --   --   --   --   LABPROT  --   --  31.6*  --   --   --   --   --  24.5* 24.9*  INR  --   --  3.1*  --   --   --   --   --  2.2* 2.3*  CREATININE 2.00*  --   --  1.93*  --   --   --   --   --  1.64*  TROPONINIHS  --   --   --  92*  --  116*  --   --   --   --    < > = values in this interval not displayed.    Estimated Creatinine Clearance: 21.2 mL/min (A) (by C-G formula based on SCr of 1.64 mg/dL (H)).   Medical History: Past Medical History:  Diagnosis Date  . Anemia   . Anginal pain ([place])   . Arteriovenous malformation of gastrointestinal tract   .  Arthritis    "fingers mostly" (04/12/2015)  . Carotid artery disease ([place])    Carotid US 3/17:  29-51% RICA; 88-41% LICA; Elevated bilateral subclavian artery velocities >>f/u 1 year. // Carotid US 4/18: R 40-59; L 1-39 >> FU 1 year // Carotid US 10/2018: R 40-59; L 1-39, L subclavian stenosis   . CHF (congestive heart failure) ([place])   . Chronic kidney disease (CKD), stage III (moderate)   . Chronic lower GI bleeding    "today; last time was ~ 8 yr ago; used to have them often before that too" (02/11/2013)  . Coronary artery disease   . DJD (degenerative joint disease)   . Dysrhythmia   . Heart murmur   . History of blood transfusion    "  a few times over the years; usually related to my Coumadin" (04/12/2015  . History of gout   . Hyperlipemia   . Hypertension   . Macula lutea degeneration   . Old MI (myocardial infarction)    "a coulple /dr in 02/2008; I never even knew I'd had them" (04/12/2015)  . Type II diabetes mellitus ([facility])     Medications:  Medications Prior to Admission  Medication Sig Dispense Refill Last Dose  . acetaminophen (TYLENOL) 500 MG tablet Take 500 mg by mouth every 6 (six) hours as needed for mild pain or headache.   unk at [employer]  . amiodarone (PACERONE) 200 MG tablet Take 1 tablet (200 mg total) by mouth daily. 90 tablet 3 11/03/2019 at am  . aspirin EC 81 MG tablet Take 1 tablet (81 mg total) by mouth daily.   11/03/2019 at 0930  . atorvastatin (LIPITOR) 80 MG tablet Take 1 tablet by mouth once daily (Patient taking differently: Take 80 mg by mouth at bedtime. ) 90 tablet 3 11/02/2019 at pm  . bevacizumab (AVASTIN) 1.25 mg/0.1 mL SOLN Place 1.25 mg into the left eye every 8 (eight) weeks.   Past Week at Unknown time  . digoxin (LANOXIN) 0.125 MG tablet Take 0.125 mg by mouth daily.   11/03/2019 at am  . ezetimibe (ZETIA) 10 MG tablet TAKE 1 TABLET BY MOUTH ONCE DAILY PLEASE  MAKE  OVERDUE  APPOINTMENT  WITH  DR  Irish Lack  BEFORE  ANYMORE  REFILLS (Patient taking  differently: Take 10 mg by mouth at bedtime. ) 90 tablet 3 11/02/2019 at pm  . FeFum-FePo-FA-B Cmp-C-Zn-Mn-Cu (SE-TAN PLUS) 162-115.2-1 MG CAPS Take 1 capsule by mouth 2 (two) times daily.   3 11/03/2019 at Unknown time  . folic acid (FOLVITE) 546 MCG tablet Take 400 mcg by mouth every evening.    11/02/2019 at pm  . furosemide (LASIX) 20 MG tablet Take 1 tablet (20 mg total) by mouth daily. You may take an extra 20 mg as needed for 2-3 lb weight gain overnight (Patient taking differently: Take 20 mg by mouth See admin instructions. Take 20 mg by mouth in the morning and and additional 20 mg as needed for an overnight weight gain of 2-3 pounds) 90 tablet 3 11/03/2019 at am  . isosorbide mononitrate (IMDUR) 60 MG 24 hr tablet Take 1 tablet (60 mg total) by mouth daily. 90 tablet 3 11/03/2019 at am  . JANTOVEN 5 MG tablet TAKE 1/2 TO 1 (ONE-HALF TO ONE) TABLET BY MOUTH ONCE DAILY AS  DIRECTED  BY  COUMADIN  CLINIC (Patient taking differently: Take 2.5-5 mg by mouth See admin instructions. Take 2.5 mg by mouth at bedtime on Sun/Wed/Fri and 5 mg on Mon/Tues/Thurs/Sat) 90 tablet 0 11/02/2019 at 2100  . JANUVIA 50 MG tablet Take 50 mg by mouth daily.    11/03/2019 at am  . losartan (COZAAR) 50 MG tablet Take 50 mg by mouth daily.   11/03/2019 at am  . metoprolol tartrate (LOPRESSOR) 25 MG tablet Take 1 tablet (25 mg total) by mouth daily. (Patient taking differently: Take 25 mg by mouth 2 (two) times daily. ) 90 tablet 3 11/03/2019 at Unknown time  . Multiple Vitamin (MULTIVITAMIN WITH MINERALS) TABS Take 1 tablet by mouth daily.   11/03/2019 at Unknown time  . Multiple Vitamins-Minerals (PRESERVISION AREDS 2 PO) Take 1 tablet by mouth 2 (two) times daily.    11/03/2019 at am  . nitroGLYCERIN (NITROSTAT) 0.4 MG SL tablet Place  1 tablet (0.4 mg total) under the tongue every 5 (five) minutes as needed for chest pain. Please make appt for January. 1st attempt (Patient taking differently: Place 0.4 mg under the tongue every 5  (five) minutes as needed for chest pain. ) 25 tablet 0 unk at unk  . Omega-3 Fatty Acids (FISH OIL PO) Take 1 capsule by mouth in the morning and at bedtime.   11/03/2019 at am  . potassium chloride (KLOR-CON) 10 MEQ tablet Take 10 mEq by mouth 2 (two) times daily.   11/03/2019 at am    Assessment: 86 yo female with GIB. She in on warfarin PTA and this is on hold with plans for enteroscopy when INR < 2.0. Pharmacy consulted to start heparin when INR < 2.0 -INR= 2.3, HG= 7.8  Goal of Therapy:  INR 2-3 Monitor platelets by anticoagulation protocol: Yes   Plan:  -Start heparin when INR is less than 2.0 -Daily PT/INR  Hildred Laser, PharmD Clinical Pharmacist **Pharmacist phone directory can now be found on [email or web address] ([email or web address]).  Listed under [place].

## 2019-11-05 NOTE — Progress Notes (Signed)
[facility] Progress Note  Chief Complaint: Melena  History:  She has had no bowel movement today, so therefore no bleeding witnessed.  INR was 2.2 last evening, 2.3 this morning, next check is scheduled for tomorrow a.m. Hemoglobin 8.5 yesterday morning, 8.7 last evening, 7.8 this morning.  ROS: Cardiovascular: Denies chest pain Respiratory: Denies dyspnea Urinary: Denies dysuria  Objective:   Current Facility-Administered Medications:  .  0.9 %  sodium chloride infusion, 10 mL/hr, Intravenous, Once, Henderly, Britni A, PA-C .  amiodarone (PACERONE) tablet 200 mg, 200 mg, Oral, Daily, Dana Allan I, MD, 200 mg at 11/05/19 1035 .  atorvastatin (LIPITOR) tablet 80 mg, 80 mg, Oral, Daily, Dana Allan I, MD, 80 mg at 11/05/19 1034 .  docusate sodium (COLACE) capsule 100 mg, 100 mg, Oral, BID PRN, Dessa Phi, DO, 100 mg at 11/05/19 1447 .  ezetimibe (ZETIA) tablet 10 mg, 10 mg, Oral, QHS, Dessa Phi, DO, 10 mg at 11/04/19 2135 .  insulin aspart (novoLOG) injection 0-9 Units, 0-9 Units, Subcutaneous, Q4H, Dessa Phi, DO, 2 Units at 11/04/19 2135 .  isosorbide mononitrate (IMDUR) 24 hr tablet 120 mg, 120 mg, Oral, Daily, Dana Allan I, MD, 120 mg at 11/05/19 1034 .  metoprolol tartrate (LOPRESSOR) tablet 25 mg, 25 mg, Oral, Daily, Dana Allan I, MD, 25 mg at 11/05/19 1034 .  simethicone (MYLICON) chewable tablet 80 mg, 80 mg, Oral, Q6H PRN, Dessa Phi, DO, 80 mg at 11/05/19 1447 .  sodium chloride 0.9 % bolus 500 mL, 500 mL, Intravenous, Once, Henderly, Britni A, PA-C  . sodium chloride    . sodium chloride       Vital signs in last 24 hrs: Vitals:   11/05/19 0902 11/05/19 1637  BP: (!) 131/54 (!) 111/52  Pulse:    Resp: 18 20  Temp: 97.7 F (36.5 C) 97.8 F (36.6 C)  SpO2: 100% 100%   No intake or output data in the 24 hours ending 11/05/19 1833   Physical Exam In no distress, walking around her room without difficulty.  Transfers to  bed easily.  HEENT: sclera anicteric, oral mucosa without lesions  Neck: supple, no thyromegaly, JVD or lymphadenopathy  Cardiac: RRR, metallic valve sound, systolic murmur, no peripheral edema  Pulm: clear to auscultation bilaterally, normal RR and effort noted  Abdomen: soft, no tenderness, with active bowel sounds. No guarding or palpable hepatosplenomegaly  Skin; warm and dry, no jaundice, pale  Recent Labs:  CBC Latest Ref Rng & Units 11/05/2019 11/04/2019 11/04/2019  WBC 4.0 - 10.5 K/uL 5.6 - -  Hemoglobin 12.0 - 15.0 g/dL 7.8(L) 8.7(L) 8.5(L)  Hematocrit 36.0 - 46.0 % 24.8(L) 26.8(L) 27.5(L)  Platelets 150 - 400 K/uL 148(L) - -    Recent Labs  Lab 11/05/19 0420  INR 2.3*   CMP Latest Ref Rng & Units 11/05/2019 11/04/2019 11/03/2019  Glucose 70 - 99 mg/dL 97 92 136(H)  BUN 8 - 23 mg/dL 36(H) 45(H) 49(H)  Creatinine 0.44 - 1.00 mg/dL 1.64(H) 1.93(H) 2.00(H)  Sodium 135 - 145 mmol/L 140 136 133(L)  Potassium 3.5 - 5.1 mmol/L 3.7 3.8 3.9  Chloride 98 - 111 mmol/L 109 107 103  CO2 22 - 32 mmol/L 22 23 22   Calcium 8.9 - 10.3 mg/dL 8.2(L) 8.1(L) 8.2(L)  Total Protein 6.5 - 8.1 g/dL - - 5.3(L)  Total Bilirubin 0.3 - 1.2 mg/dL - - 0.8  Alkaline Phos 38 - 126 U/L - - 32(L)  AST 15 - 41 U/L - - 30  ALT 0 - 44 U/L - - 30     @ASSESSMENTPLANBEGIN @ Assessment: Melena, prehospital. Acute blood loss anemia, most likely on top of anemia chronic kidney disease Chronic kidney disease Long-term use anticoagulation, mechanical AVR.   No overt bleeding since admission. Plan: Tentative plans for small bowel enteroscopy tomorrow if INR less than 2.  When INR below 2, IV heparin will be started.  However, will need to be stopped a few hours prior to the procedure.  She was agreeable to an enteroscopy after discussion of procedure and risks.  The benefits and risks of the planned procedure were described in detail with the patient or (when appropriate) their health care proxy.  Risks  were outlined as including, but not limited to, bleeding, infection, perforation, adverse medication reaction leading to cardiac or pulmonary decompensation, pancreatitis (if ERCP).  The limitation of incomplete mucosal visualization was also discussed.  No guarantees or warranties were given.  Patient at increased risk for cardiopulmonary complications of procedure due to medical comorbidities.   Nelida Meuse III Office: [phone]

## 2019-11-05 NOTE — Progress Notes (Signed)
Progress Note  Patient Name: [name] Date of Encounter: 11/05/2019  Primary Cardiologist: Larae Grooms, MD   Subjective   Feels well. No overt bleeding. No angina or dyspnea. Hgb slightly lower at 7.8 INR 2.3.  Inpatient Medications    Scheduled Meds: . amiodarone  200 mg Oral Daily  . atorvastatin  80 mg Oral Daily  . ezetimibe  10 mg Oral QHS  . insulin aspart  0-9 Units Subcutaneous Q4H  . isosorbide mononitrate  120 mg Oral Daily  . metoprolol tartrate  25 mg Oral Daily   Continuous Infusions: . sodium chloride    . sodium chloride     PRN Meds:    Vital Signs    Vitals:   11/04/19 1222 11/04/19 1701 11/04/19 2237 11/05/19 0902  BP: (!) 140/55 (!) 116/46 (!) 118/54 (!) 131/54  Pulse: 64  82   Resp: 20   18  Temp: 98.7 F (37.1 C) 97.7 F (36.5 C) 98.4 F (36.9 C) 97.7 F (36.5 C)  TempSrc: Oral Oral Oral Oral  SpO2: 98%  100% 100%  Weight:      Height:        Intake/Output Summary (Last 24 hours) at 11/05/2019 1035 Last data filed at 11/04/2019 1800 Gross per 24 hour  Intake 480 ml  Output --  Net 480 ml   Last 3 Weights 11/03/2019 11/03/2019 09/06/2019  Weight (lbs) 139 lb 12.4 oz 134 lb 140 lb 12.8 oz  Weight (kg) 63.4 kg 60.782 kg 63.866 kg      Telemetry    NSR - Personally Reviewed  ECG    No new tracing - Personally Reviewed  Physical Exam  Pale GEN: No acute distress.   Neck: No JVD Cardiac: RRR, crisp prosthetic valve clicks, 2/6 early peaking Ao ejection murmur, no diastolic murmurs, rubs, or gallops.  Respiratory: Clear to auscultation bilaterally. GI: Soft, nontender, non-distended  MS: No edema; No deformity. Neuro:  Nonfocal  Psych: Normal affect   Labs    High Sensitivity Troponin:   Recent Labs  Lab 11/04/19 0337 11/04/19 0453  TROPONINIHS 92* 116*      Chemistry Recent Labs  Lab 11/03/19 1539 11/04/19 0337 11/05/19 0420  NA 133* 136 140  K 3.9 3.8 3.7  CL 103 107 109  CO2 _0 GLUCOSE 136* 92 97  BUN 49* 45* 36*  CREATININE 2.00* 1.93* 1.64*  CALCIUM 8.2* 8.1* 8.2*  PROT 5.3*  --   --   ALBUMIN 3.2*  --   --   AST 30  --   --   ALT 30  --   --   ALKPHOS 32*  --   --   BILITOT 0.8  --   --   GFRNONAA 22* 23* 28*  GFRAA 26* 27* 33*  ANIONGAP _1 Hematology Recent Labs  Lab 11/03/19 1539 11/04/19 0337 11/04/19 1029 11/04/19 1907 11/05/19 0420  WBC 7.5  --   --   --  5.6  RBC 1.80*  --   --   --  2.50*  HGB 5.6*   < > 8.5* 8.7* 7.8*  HCT 19.6*   < > 27.5* 26.8* 24.8*  MCV 108.9*  --   --   --  99.2  MCH 31.1  --   --   --  31.2  MCHC 28.6*  --   --   --  31.5  RDW 22.8*  --   --   --  22.3*  PLT 206  --   --   --  148*   < > = values in this interval not displayed.    BNPNo results for input(s): BNP, PROBNP in the last 168 hours.   DDimer No results for input(s): DDIMER in the last 168 hours.   Radiology    DG Chest Portable 1 View  Result Date: 11/03/2019 CLINICAL DATA:  Weakness and shortness of breath. EXAM: PORTABLE CHEST 1 VIEW COMPARISON:  Radiograph 11/27/2018 FINDINGS: Stable mild cardiomegaly post CABG. Possible trace pleural effusions. No pulmonary edema. No focal airspace disease. No pneumothorax. No acute osseous abnormalities are seen. IMPRESSION: 1. Possible trace pleural effusions. 2. Stable cardiomegaly post CABG. Aortic Atherosclerosis (ICD10-I70.0). Electronically Signed   By: Keith Rake M.D.   On: 11/03/2019 16:51    Cardiac Studies   ECHO 11/30/2018 1. The left ventricle has normal systolic function with an ejection  fraction of 60-65%. The cavity size was normal. There is moderately  increased left ventricular wall thickness. Left ventricular diastolic  function could not be evaluated secondary to  atrial fibrillation. No evidence of left ventricular regional wall motion  abnormalities.  2. Left atrial size was mildly dilated.  3. The mitral valve is degenerative. Moderate thickening of the mitral   valve leaflet. Moderate calcification of the mitral valve leaflet. There  is moderate mitral annular calcification present. A cannot exclude  vegetation vegetation is seen on the  mitral leaflet.  4. Aortic valve regurgitation is trivial by color flow Doppler. mild  stenosis of the aortic valve.  5. The ascending aorta and aortic root are normal in size and structure.  6. When compared to the prior study: 7/9/201: LVEF 60-65%, normal  mechanical AOV prosthesis, mean gradient 28 mmHg, MAC with mild MR.  7. Cannot exclude small ASD.   Patient Profile     86 y.o. female with a hx of CAD, aortic valve disease s/p CABG and mechanical AVR on chronic Coumadin and aspirin, afib, HFpEF, HTN, HLD, CKD stage III, type II DM presenting with symptomatic anemia due to GI bleeding.  Assessment & Plan    Improved following transfusion. Start IV heparin when INR<2. Planned enteroscopy tomorrow.     For questions or updates, please contact [place] Please consult [email or web address] for contact info under        Signed, Sanda Klein, MD  11/05/2019, 10:35 AM

## 2019-11-06 ENCOUNTER — Encounter (HOSPITAL_COMMUNITY)
Admission: EM | Disposition: A | Discharge status: Home / Self Care | Payer: Self-pay | Source: Home / Self Care | Attending: Internal Medicine

## 2019-11-06 ENCOUNTER — Inpatient Hospital Stay (HOSPITAL_COMMUNITY): Payer: Medicare Other | Admitting: Certified Registered Nurse Anesthetist

## 2019-11-06 ENCOUNTER — Encounter (HOSPITAL_COMMUNITY): Payer: Self-pay | Admitting: Internal Medicine

## 2019-11-06 DIAGNOSIS — K259 Gastric ulcer, unspecified as acute or chronic, without hemorrhage or perforation: Secondary | ICD-10-CM

## 2019-11-06 DIAGNOSIS — K5520 Angiodysplasia of colon without hemorrhage: Secondary | ICD-10-CM

## 2019-11-06 HISTORY — PX: HOT HEMOSTASIS: SHX5433

## 2019-11-06 HISTORY — PX: ENTEROSCOPY: SHX5533

## 2019-11-06 HISTORY — PX: BIOPSY: SHX5522

## 2019-11-06 LAB — BASIC METABOLIC PANEL
Anion gap: 14 (ref 5–15)
BUN: 28 mg/dL — ABNORMAL HIGH (ref 8–23)
CO2: 20 mmol/L — ABNORMAL LOW (ref 22–32)
Calcium: 8.5 mg/dL — ABNORMAL LOW (ref 8.9–10.3)
Chloride: 103 mmol/L (ref 98–111)
Creatinine, Ser: 1.45 mg/dL — ABNORMAL HIGH (ref 0.44–1.00)
GFR calc Af Amer: 38 mL/min — ABNORMAL LOW (ref >60)
GFR calc non Af Amer: 33 mL/min — ABNORMAL LOW (ref >60)
Glucose, Bld: 108 mg/dL — ABNORMAL HIGH (ref 70–99)
Potassium: 4.2 mmol/L (ref 3.5–5.1)
Sodium: 137 mmol/L (ref 135–145)

## 2019-11-06 LAB — CBC
HCT: 27.9 % — ABNORMAL LOW (ref 36.0–46.0)
Hemoglobin: 8.6 g/dL — ABNORMAL LOW (ref 12.0–15.0)
MCH: 31.6 pg (ref 26.0–34.0)
MCHC: 30.8 g/dL (ref 30.0–36.0)
MCV: 102.6 fL — ABNORMAL HIGH (ref 80.0–100.0)
Platelets: 178 10*3/uL (ref 150–400)
RBC: 2.72 MIL/uL — ABNORMAL LOW (ref 3.87–5.11)
RDW: 21.5 % — ABNORMAL HIGH (ref 11.5–15.5)
WBC: 5.9 10*3/uL (ref 4.0–10.5)
nRBC: 0.3 % — ABNORMAL HIGH (ref 0.0–0.2)

## 2019-11-06 LAB — GLUCOSE, CAPILLARY
Glucose-Capillary: 103 mg/dL — ABNORMAL HIGH (ref 70–99)
Glucose-Capillary: 112 mg/dL — ABNORMAL HIGH (ref 70–99)
Glucose-Capillary: 115 mg/dL — ABNORMAL HIGH (ref 70–99)
Glucose-Capillary: 122 mg/dL — ABNORMAL HIGH (ref 70–99)
Glucose-Capillary: 122 mg/dL — ABNORMAL HIGH (ref 70–99)
Glucose-Capillary: 128 mg/dL — ABNORMAL HIGH (ref 70–99)
Glucose-Capillary: 148 mg/dL — ABNORMAL HIGH (ref 70–99)
Glucose-Capillary: 210 mg/dL — ABNORMAL HIGH (ref 70–99)

## 2019-11-06 LAB — PROTIME-INR
INR: 1.5 — ABNORMAL HIGH (ref 0.8–1.2)
Prothrombin Time: 17.8 seconds — ABNORMAL HIGH (ref 11.4–15.2)

## 2019-11-06 SURGERY — ENTEROSCOPY
Anesthesia: Monitor Anesthesia Care

## 2019-11-06 MED ORDER — WARFARIN SODIUM 2.5 MG PO TABS
2.5000 mg | ORAL_TABLET | Freq: Once | ORAL | Status: AC
  Administered 2019-11-06: 2.5 mg via ORAL
  Filled 2019-11-06: qty 1

## 2019-11-06 MED ORDER — HEPARIN (PORCINE) 25000 UT/250ML-% IV SOLN
750.0000 [IU]/h | INTRAVENOUS | Status: DC
  Administered 2019-11-06: 900 [IU]/h via INTRAVENOUS
  Filled 2019-11-06: qty 250

## 2019-11-06 MED ORDER — PROPOFOL 500 MG/50ML IV EMUL
INTRAVENOUS | Status: DC | PRN
  Administered 2019-11-06: 125 ug/kg/min via INTRAVENOUS

## 2019-11-06 MED ORDER — INSULIN ASPART 100 UNIT/ML ~~LOC~~ SOLN
0.0000 [IU] | Freq: Three times a day (TID) | SUBCUTANEOUS | Status: DC
  Administered 2019-11-07: 2 [IU] via SUBCUTANEOUS
  Administered 2019-11-07 (×2): 1 [IU] via SUBCUTANEOUS
  Administered 2019-11-08: 2 [IU] via SUBCUTANEOUS
  Administered 2019-11-08: 3 [IU] via SUBCUTANEOUS
  Administered 2019-11-09: 1 [IU] via SUBCUTANEOUS

## 2019-11-06 MED ORDER — FUROSEMIDE 20 MG PO TABS
20.0000 mg | ORAL_TABLET | Freq: Every day | ORAL | Status: DC
  Administered 2019-11-06 – 2019-11-08 (×3): 20 mg via ORAL
  Filled 2019-11-06 (×3): qty 1

## 2019-11-06 MED ORDER — HEPARIN (PORCINE) 25000 UT/250ML-% IV SOLN: 900.0000 [IU]/h | INTRAVENOUS | Status: DC

## 2019-11-06 MED ORDER — LACTATED RINGERS IV SOLN: INTRAVENOUS | Status: DC

## 2019-11-06 MED ORDER — SODIUM CHLORIDE 0.9 % IV SOLN: INTRAVENOUS | Status: DC

## 2019-11-06 MED ORDER — WARFARIN - PHARMACIST DOSING INPATIENT: Freq: Every day | Status: DC

## 2019-11-06 MED ORDER — PROPOFOL 10 MG/ML IV BOLUS
INTRAVENOUS | Status: DC | PRN
  Administered 2019-11-06: 50 mg via INTRAVENOUS

## 2019-11-06 MED ORDER — LIDOCAINE 2% (20 MG/ML) 5 ML SYRINGE
INTRAMUSCULAR | Status: DC | PRN
  Administered 2019-11-06: 50 mg via INTRAVENOUS

## 2019-11-06 NOTE — Progress Notes (Addendum)
[facility] for heparin  Indication: mechanical AVR  Allergies  Allergen Reactions  . Darvon [Propoxyphene] Nausea And Vomiting  . Lisinopril Cough  . Septra [Sulfamethoxazole-Trimethoprim] Other (See Comments)    Increased INR  . Warfarin And Related Other (See Comments)    ONLY TOLERATES BRAND  . Penicillins Rash    Did it involve swelling of the face/tongue/throat, SOB, or low BP? No Did it involve sudden or severe rash/hives, skin peeling, or any reaction on the inside of your mouth or nose? No Did you need to seek medical attention at a hospital or doctor's office? No When did it last happen?15 years If all above answers are "NO", may proceed with cephalosporin use.    Patient Measurements: Height: 5' (152.4 cm) Weight: 139 lb 12.4 oz (63.4 kg) IBW/kg (Calculated) : 45.5   Vital Signs: Temp: 98.2 F (36.8 C) (02/13 2200) Temp Source: Oral (02/13 2200) BP: 131/61 (02/13 2200) Pulse Rate: 89 (02/13 2200)  Labs: Recent Labs     0000 11/03/19 1539 11/03/19 1539 11/03/19 1651 11/04/19 0337 11/04/19 0453 11/04/19 1029 11/04/19 1907 11/04/19 1907 11/05/19 0420 11/06/19 0335  HGB  --  5.6*   < >  --  8.0*  --    < > 8.7*   < > 7.8* 8.6*  HCT  --  19.6*   < >  --  24.8*  --    < > 26.8*  --  24.8* 27.9*  PLT  --  206  --   --   --   --   --   --   --  148* 178  APTT  --   --   --  36  --   --   --   --   --   --   --   LABPROT   < >  --   --  31.6*  --   --   --  24.5*  --  24.9* 17.8*  INR   < >  --   --  3.1*  --   --   --  2.2*  --  2.3* 1.5*  CREATININE  --  2.00*   < >  --  1.93*  --   --   --   --  1.64* 1.45*  TROPONINIHS  --   --   --   --  92* 116*  --   --   --   --   --    < > = values in this interval not displayed.    Estimated Creatinine Clearance: 24 mL/min (A) (by C-G formula based on SCr of 1.45 mg/dL (H)).   Medical History: Past Medical History:  Diagnosis Date  . Anemia   . Anginal pain ([place])    . Arteriovenous malformation of gastrointestinal tract   . Arthritis    "fingers mostly" (04/12/2015)  . Carotid artery disease ([place])    Carotid US 3/17:  55-73% RICA; 22-02% LICA; Elevated bilateral subclavian artery velocities >>f/u 1 year. // Carotid US 4/18: R 40-59; L 1-39 >> FU 1 year // Carotid US 10/2018: R 40-59; L 1-39, L subclavian stenosis   . CHF (congestive heart failure) ([place])   . Chronic kidney disease (CKD), stage III (moderate)   . Chronic lower GI bleeding    "today; last time was ~ 8 yr ago; used to have them often before that too" (02/11/2013)  . Coronary artery disease   .  DJD (degenerative joint disease)   . Dysrhythmia   . Heart murmur   . History of blood transfusion    "a few times over the years; usually related to my Coumadin" (04/12/2015  . History of gout   . Hyperlipemia   . Hypertension   . Macula lutea degeneration   . Old MI (myocardial infarction)    "a coulple /dr in 02/2008; I never even knew I'd had them" (04/12/2015)  . Type II diabetes mellitus (H[facility])     Medications:  Medications Prior to Admission  Medication Sig Dispense Refill Last Dose  . acetaminophen (TYLENOL) 500 MG tablet Take 500 mg by mouth every 6 (six) hours as needed for mild pain or headache.   unk at [employer]  . amiodarone (PACERONE) 200 MG tablet Take 1 tablet (200 mg total) by mouth daily. 90 tablet 3 11/03/2019 at am  . aspirin EC 81 MG tablet Take 1 tablet (81 mg total) by mouth daily.   11/03/2019 at 0930  . atorvastatin (LIPITOR) 80 MG tablet Take 1 tablet by mouth once daily (Patient taking differently: Take 80 mg by mouth at bedtime. ) 90 tablet 3 11/02/2019 at pm  . bevacizumab (AVASTIN) 1.25 mg/0.1 mL SOLN Place 1.25 mg into the left eye every 8 (eight) weeks.   Past Week at Unknown time  . digoxin (LANOXIN) 0.125 MG tablet Take 0.125 mg by mouth daily.   11/03/2019 at am  . ezetimibe (ZETIA) 10 MG tablet TAKE 1 TABLET BY MOUTH ONCE DAILY PLEASE  MAKE  OVERDUE  APPOINTMENT  WITH  DR   Irish Lack  BEFORE  ANYMORE  REFILLS (Patient taking differently: Take 10 mg by mouth at bedtime. ) 90 tablet 3 11/02/2019 at pm  . FeFum-FePo-FA-B Cmp-C-Zn-Mn-Cu (SE-TAN PLUS) 162-115.2-1 MG CAPS Take 1 capsule by mouth 2 (two) times daily.   3 11/03/2019 at Unknown time  . folic acid (FOLVITE) 381 MCG tablet Take 400 mcg by mouth every evening.    11/02/2019 at pm  . furosemide (LASIX) 20 MG tablet Take 1 tablet (20 mg total) by mouth daily. You may take an extra 20 mg as needed for 2-3 lb weight gain overnight (Patient taking differently: Take 20 mg by mouth See admin instructions. Take 20 mg by mouth in the morning and and additional 20 mg as needed for an overnight weight gain of 2-3 pounds) 90 tablet 3 11/03/2019 at am  . isosorbide mononitrate (IMDUR) 60 MG 24 hr tablet Take 1 tablet (60 mg total) by mouth daily. 90 tablet 3 11/03/2019 at am  . JANTOVEN 5 MG tablet TAKE 1/2 TO 1 (ONE-HALF TO ONE) TABLET BY MOUTH ONCE DAILY AS  DIRECTED  BY  COUMADIN  CLINIC (Patient taking differently: Take 2.5-5 mg by mouth See admin instructions. Take 2.5 mg by mouth at bedtime on Sun/Wed/Fri and 5 mg on Mon/Tues/Thurs/Sat) 90 tablet 0 11/02/2019 at 2100  . JANUVIA 50 MG tablet Take 50 mg by mouth daily.    11/03/2019 at am  . losartan (COZAAR) 50 MG tablet Take 50 mg by mouth daily.   11/03/2019 at am  . metoprolol tartrate (LOPRESSOR) 25 MG tablet Take 1 tablet (25 mg total) by mouth daily. (Patient taking differently: Take 25 mg by mouth 2 (two) times daily. ) 90 tablet 3 11/03/2019 at Unknown time  . Multiple Vitamin (MULTIVITAMIN WITH MINERALS) TABS Take 1 tablet by mouth daily.   11/03/2019 at Unknown time  . Multiple Vitamins-Minerals (PRESERVISION AREDS 2 PO) Take  1 tablet by mouth 2 (two) times daily.    11/03/2019 at am  . nitroGLYCERIN (NITROSTAT) 0.4 MG SL tablet Place 1 tablet (0.4 mg total) under the tongue every 5 (five) minutes as needed for chest pain. Please make appt for January. 1st attempt (Patient  taking differently: Place 0.4 mg under the tongue every 5 (five) minutes as needed for chest pain. ) 25 tablet 0 unk at unk  . Omega-3 Fatty Acids (FISH OIL PO) Take 1 capsule by mouth in the morning and at bedtime.   11/03/2019 at am  . potassium chloride (KLOR-CON) 10 MEQ tablet Take 10 mEq by mouth 2 (two) times daily.   11/03/2019 at am    Assessment: 86 yo female with GIB. She in on warfarin PTA and this is on hold with plans for enteroscopy when INR < 2.0. Pharmacy consulted to start heparin when INR < 2.0. Likely enteroscopy today -INR= 1.5, hg= 8.6  Goal of Therapy:  Heparin level 0.3-0.5 Monitor platelets by anticoagulation protocol: Yes   Plan:  -Start heparin at 900 units/hr -Heparin level in 8 hours and daily wth CBC daily    Hildred Laser, PharmD Clinical Pharmacist **Pharmacist phone directory can now be found on [email or web address] ([email or web address]).  Listed under [facility].

## 2019-11-06 NOTE — Interval H&P Note (Signed)
History and Physical Interval Note:  11/06/2019 9:22 AM  [name]  has presented today for surgery, with the diagnosis of melena.  The various methods of treatment have been discussed with the patient and family. After consideration of risks, benefits and other options for treatment, the patient has consented to  Procedure(s): ENTEROSCOPY (N/A) as a surgical intervention.  The patient's history has been reviewed, patient examined, no change in status, stable for surgery.  I have reviewed the patient's chart and labs.  Questions were answered to the patient's satisfaction.    Hgb 8.6 and INR 1.5 this AM Heparin will be started after procedure  Nelida Meuse III

## 2019-11-06 NOTE — Progress Notes (Signed)
Progress Note  Patient Name: [name] Date of Encounter: 11/06/2019  Primary Cardiologist: Larae Grooms, MD   Subjective   Just back from EGD/enteroscopy. Had 2 non-bleeding gastric ulcers (biopsied) and a single jejunal non-bleeding angioectasia, Rx'd w argon laser.  Inpatient Medications    Scheduled Meds: . amiodarone  200 mg Oral Daily  . atorvastatin  80 mg Oral Daily  . ezetimibe  10 mg Oral QHS  . furosemide  20 mg Oral Daily  . insulin aspart  0-9 Units Subcutaneous Q4H  . isosorbide mononitrate  120 mg Oral Daily  . metoprolol tartrate  25 mg Oral Daily  . warfarin  2.5 mg Oral ONCE-1800  . Warfarin - Pharmacist Dosing Inpatient   Does not apply q1800   Continuous Infusions: . sodium chloride    . heparin    . sodium chloride     PRN Meds: docusate sodium, simethicone   Vital Signs    Vitals:   11/06/19 1030 11/06/19 1040 11/06/19 1050 11/06/19 1117  BP: (!) 109/92 (!) 123/43 (!) 146/54 (!) 144/56  Pulse: 84 89 89   Resp: 13 20 (!) 22 20  Temp: 97.8 F (36.6 C)   (!) 97.5 F (36.4 C)  TempSrc: Oral   Oral  SpO2: 100% 99% 100% 100%  Weight:      Height:        Intake/Output Summary (Last 24 hours) at 11/06/2019 1200 Last data filed at 11/06/2019 1034 Gross per 24 hour  Intake 415 ml  Output 0 ml  Net 415 ml   Last 3 Weights 11/03/2019 11/03/2019 09/06/2019  Weight (lbs) 139 lb 12.4 oz 134 lb 140 lb 12.8 oz  Weight (kg) 63.4 kg 60.782 kg 63.866 kg      Telemetry    NSR - Personally Reviewed  ECG    No new ECG - Personally Reviewed  Physical Exam  Pale, otherwise appears well GEN: No acute distress.   Neck: No JVD Cardiac: RRR, crisp mechanical valve clicks, 1/6 AO ejection murmur, no diastolic murmurs, rubs, or gallops.  Respiratory: Clear to auscultation bilaterally. GI: Soft, nontender, non-distended  MS: No edema; No deformity. Neuro:  Nonfocal  Psych: Normal affect   Labs    High Sensitivity Troponin:   Recent  Labs  Lab 11/04/19 0337 11/04/19 0453  TROPONINIHS 92* 116*      Chemistry Recent Labs  Lab 11/03/19 1539 11/03/19 1539 11/04/19 0337 11/05/19 0420 11/06/19 0335  NA 133*   < > 136 140 137  K 3.9   < > 3.8 3.7 4.2  CL 103   < > 107 109 103  CO2 22   < > 23 22 20*  GLUCOSE 136*   < > 92 97 108*  BUN 49*   < > 45* 36* 28*  CREATININE 2.00*   < > 1.93* 1.64* 1.45*  CALCIUM 8.2*   < > 8.1* 8.2* 8.5*  PROT 5.3*  --   --   --   --   ALBUMIN 3.2*  --   --   --   --   AST 30  --   --   --   --   ALT 30  --   --   --   --   ALKPHOS 32*  --   --   --   --   BILITOT 0.8  --   --   --   --   GFRNONAA 22*   < >  23* 28* 33*  GFRAA 26*   < > 27* 33* 38*  ANIONGAP 8   < > _0 < > = values in this interval not displayed.     Hematology Recent Labs  Lab 11/03/19 1539 11/04/19 0337 11/04/19 1907 11/05/19 0420 11/06/19 0335  WBC 7.5  --   --  5.6 5.9  RBC 1.80*  --   --  2.50* 2.72*  HGB 5.6*   < > 8.7* 7.8* 8.6*  HCT 19.6*   < > 26.8* 24.8* 27.9*  MCV 108.9*  --   --  99.2 102.6*  MCH 31.1  --   --  31.2 31.6  MCHC 28.6*  --   --  31.5 30.8  RDW 22.8*  --   --  22.3* 21.5*  PLT 206  --   --  148* 178   < > = values in this interval not displayed.    BNPNo results for input(s): BNP, PROBNP in the last 168 hours.   DDimer No results for input(s): DDIMER in the last 168 hours.   Radiology    No results found.  Cardiac Studies   ECHO 11/30/2018 1. The left ventricle has normal systolic function with an ejection  fraction of 60-65%. The cavity size was normal. There is moderately  increased left ventricular wall thickness. Left ventricular diastolic  function could not be evaluated secondary to  atrial fibrillation. No evidence of left ventricular regional wall motion  abnormalities.  2. Left atrial size was mildly dilated.  3. The mitral valve is degenerative. Moderate thickening of the mitral  valve leaflet. Moderate calcification of the mitral valve  leaflet. There  is moderate mitral annular calcification present. A cannot exclude  vegetation vegetation is seen on the  mitral leaflet.  4. Aortic valve regurgitation is trivial by color flow Doppler. mild  stenosis of the aortic valve.  5. The ascending aorta and aortic root are normal in size and structure.  6. When compared to the prior study: 7/9/201: LVEF 60-65%, normal  mechanical AOV prosthesis, mean gradient 28 mmHg, MAC with mild MR.  7. Cannot exclude small ASD.  Patient Profile     86 y.o. female with a hx of CAD, aortic valve disease s/p CABG and mechanical AVR on chronic Coumadin and aspirin, afib, HFpEF, HTN, HLD, CKD stage III, type II DM presenting with symptomatic anemia due to GI bleeding.  Assessment & Plan    Will restart warfarin. IV heparin until INR>2.     For questions or updates, please contact [place] Please consult [email or web address] for contact info under        Signed, Sanda Klein, MD  11/06/2019, 12:00 PM

## 2019-11-06 NOTE — Progress Notes (Signed)
PROGRESS NOTE    [name]  UEK:[id number] DOB: 10-Nov-1932 DOA: 11/03/2019 PCP: Gaynelle Arabian, MD     Brief Narrative:  [name] is an 86 year old Caucasian female with past medical history significant for atrial fibrillation, status post mechanical aortic valve replacement, chronic kidney disease stage III, diastolic congestive heart failure with last echocardiogram revealed an ejection fraction of greater than 65%, chronic lower GI bleed, AV malformation, MI, coronary artery disease, diabetes mellitus, hypertension and hyperlipidemia.  Patient is on Coumadin for atrial fibrillation and prostatic aortic heart valve.  On 11/01/2019, INR was 4.7.  Patient's Coumadin has been on hold.  Patient reported significant dark blood per rectum 5 days ago, and has had intermittent rectal bleed since then, though not as significant. On presentation to the hospital, patient's hemoglobin was 5.6 g/dL (baseline of 10.9 g/dL).  Patient admitted for GI bleed and blood transfusion.  GI consulted.  New events last 24 hours / Subjective: No BM since admission. Underwent small bowel enteroscopy this morning. Complains of some lower extremity edema, has not gotten home lasix during admission.   Assessment & Plan:   Active Problems:   H/O mechanical aortic valve replacement   Symptomatic anemia   Acute GI bleeding   GI bleed with blood loss anemia -Patient with history of small bowel AVM -FOBT positive -Patient transfused 2 unit packed red blood cell on 2/11 -Hgb stable 8.6 this morning  -S/p small bowel enteroscopy 2/14:    Non-bleeding gastric ulcer with no stigmata of bleeding  A single non-bleeding angioectasia in the jejunum. Treated with argon plasma coagulation (APC).  Use Protonix (pantoprazole) 40 mg PO BID x 6 weeks, then once daily indefinitely   No aspirin for 4 weeks, then resume at 81 mg daily.  Resume IV heparin in 4 hours. Resume coumadin tonight   Check results of B12 and  folate levels  Upon discharge, once daily slow release iron tablet for 6 weeks.  Paroxysmal atrial fibrillation -Continue amiodarone, digoxin -Resume coumadin. Goal INR has been 2-2.5 as outpatient.  -Cardiology following   Status post prosthetic aortic valve replacement -Resume coumadin. Goal INR has been 2-2.5 as outpatient.  -Cardiology following   Type 2 diabetes -Sliding scale insulin  AKI on CKD stage IIIb-IV -Baseline creatinine 1.5 -Hold Cozaar -Stable Cr 1.45   Hypertension -Continue Lopressor  CAD -Continue Imdur, hold aspirin  Hyperlipidemia -Continue Lipitor, Zetia  Chronic diastolic HF  -Resume lasix     DVT prophylaxis: SCD, IV heparin. Resume coumadin tonight  Code Status: Full code Family Communication: Discussed with family at bedside  Disposition Plan:  . Patient is from home prior to admission. . Currently in-hospital treatment needed due to GI bleed and monitor stabilization on resuming coumadin tonight  . Suspect patient will discharge home in 1-2 days     Consultants:   GI  Cardiology  Procedures:   Small bowel enteroscopy 2/14   Antimicrobials:  Anti-infectives (From admission, onward)   None       Objective: Vitals:   11/06/19 1030 11/06/19 1040 11/06/19 1050 11/06/19 1117  BP: (!) 109/92 (!) 123/43 (!) 146/54 (!) 144/56  Pulse: 84 89 89   Resp: 13 20 (!) 22 20  Temp: 97.8 F (36.6 C)   (!) 97.5 F (36.4 C)  TempSrc: Oral   Oral  SpO2: 100% 99% 100% 100%  Weight:      Height:        Intake/Output Summary (Last 24 hours) at 11/06/2019 1155  Last data filed at 11/06/2019 1034 Gross per 24 hour  Intake 415 ml  Output 0 ml  Net 415 ml   Filed Weights   11/03/19 1819 11/03/19 2012  Weight: 60.8 kg 63.4 kg    Examination: General exam: Appears calm and comfortable  Respiratory system: Clear to auscultation. Respiratory effort normal. Cardiovascular system: S1 & S2 heard, +valvular click. Non pitting pedal  edema. Gastrointestinal system: Abdomen is nondistended, soft and nontender. Normal bowel sounds heard. Central nervous system: Alert and oriented. Non focal exam. Speech clear  Extremities: Symmetric in appearance bilaterally  Skin: No rashes, lesions or ulcers on exposed skin  Psychiatry: Judgement and insight appear stable. Mood & affect appropriate.    Data Reviewed: I have personally reviewed following labs and imaging studies  CBC: Recent Labs  Lab 11/03/19 1539 11/03/19 1539 11/04/19 0337 11/04/19 1029 11/04/19 1907 11/05/19 0420 11/06/19 0335  WBC 7.5  --   --   --   --  5.6 5.9  HGB 5.6*   < > 8.0* 8.5* 8.7* 7.8* 8.6*  HCT 19.6*   < > 24.8* 27.5* 26.8* 24.8* 27.9*  MCV 108.9*  --   --   --   --  99.2 102.6*  PLT 206  --   --   --   --  148* 178   < > = values in this interval not displayed.   Basic Metabolic Panel: Recent Labs  Lab 11/03/19 1539 11/03/19 1915 11/04/19 0337 11/05/19 0420 11/06/19 0335  NA 133*  --  136 140 137  K 3.9  --  3.8 3.7 4.2  CL 103  --  107 109 103  CO2 22  --  23 22 20*  GLUCOSE 136*  --  92 97 108*  BUN 49*  --  45* 36* 28*  CREATININE 2.00*  --  1.93* 1.64* 1.45*  CALCIUM 8.2*  --  8.1* 8.2* 8.5*  MG  --  2.1  --   --   --   PHOS  --  3.8  --   --   --    GFR: Estimated Creatinine Clearance: 24 mL/min (A) (by C-G formula based on SCr of 1.45 mg/dL (H)). Liver Function Tests: Recent Labs  Lab 11/03/19 1539  AST 30  ALT 30  ALKPHOS 32*  BILITOT 0.8  PROT 5.3*  ALBUMIN 3.2*   No results for input(s): LIPASE, AMYLASE in the last 168 hours. No results for input(s): AMMONIA in the last 168 hours. Coagulation Profile: Recent Labs  Lab 11/01/19 1202 11/03/19 1651 11/04/19 1907 11/05/19 0420 11/06/19 0335  INR 4.7* 3.1* 2.2* 2.3* 1.5*   Cardiac Enzymes: No results for input(s): CKTOTAL, CKMB, CKMBINDEX, TROPONINI in the last 168 hours. BNP (last 3 results) No results for input(s): PROBNP in the last 8760 hours.  HbA1C: No results for input(s): HGBA1C in the last 72 hours. CBG: Recent Labs  Lab 11/05/19 1646 11/05/19 2021 11/06/19 0020 11/06/19 0407 11/06/19 0808  GLUCAP 114* 144* 128* 103* 115*   Lipid Profile: No results for input(s): CHOL, HDL, LDLCALC, TRIG, CHOLHDL, LDLDIRECT in the last 72 hours. Thyroid Function Tests: No results for input(s): TSH, T4TOTAL, FREET4, T3FREE, THYROIDAB in the last 72 hours. Anemia Panel: No results for input(s): VITAMINB12, FOLATE, FERRITIN, TIBC, IRON, RETICCTPCT in the last 72 hours. Sepsis Labs: No results for input(s): PROCALCITON, LATICACIDVEN in the last 168 hours.  Recent Results (from the past 240 hour(s))  SARS CORONAVIRUS 2 (TAT 6-24 HRS) Nasopharyngeal  Nasopharyngeal Swab     Status: None   Collection Time: 11/03/19  5:05 PM   Specimen: Nasopharyngeal Swab  Result Value Ref Range Status   SARS Coronavirus 2 NEGATIVE NEGATIVE Final    Comment: (NOTE) SARS-CoV-2 target nucleic acids are NOT DETECTED. The SARS-CoV-2 RNA is generally detectable in upper and lower respiratory specimens during the acute phase of infection. Negative results do not preclude SARS-CoV-2 infection, do not rule out co-infections with other pathogens, and should not be used as the sole basis for treatment or other patient management decisions. Negative results must be combined with clinical observations, patient history, and epidemiological information. The expected result is Negative. Fact Sheet for Patients: SugarRoll.be Fact Sheet for Healthcare Providers: https://www.woods-mathews.com/ This test is not yet approved or cleared by the [place] FDA and  has been authorized for detection and/or diagnosis of SARS-CoV-2 by FDA under an Emergency Use Authorization (EUA). This EUA will remain  in effect (meaning this test can be used) for the duration of the COVID-19 declaration under Section 56 4(b)(1) of the Act, 21  U.S.C. section 360bbb-3(b)(1), unless the authorization is terminated or revoked sooner. Performed at [facility], [place] [address], [place], [place] [address]   MRSA PCR Screening     Status: None   Collection Time: 11/03/19  8:11 PM   Specimen: Nasopharyngeal  Result Value Ref Range Status   MRSA by PCR NEGATIVE NEGATIVE Final    Comment:        The GeneXpert MRSA Assay (FDA approved for NASAL specimens only), is one component of a comprehensive MRSA colonization surveillance program. It is not intended to diagnose MRSA infection nor to guide or monitor treatment for MRSA infections. Performed at [facility], [place] [address], [place], [place] [address]       Radiology Studies: No results found.    Scheduled Meds: . amiodarone  200 mg Oral Daily  . atorvastatin  80 mg Oral Daily  . ezetimibe  10 mg Oral QHS  . insulin aspart  0-9 Units Subcutaneous Q4H  . isosorbide mononitrate  120 mg Oral Daily  . metoprolol tartrate  25 mg Oral Daily  . warfarin  2.5 mg Oral ONCE-1800  . Warfarin - Pharmacist Dosing Inpatient   Does not apply q1800   Continuous Infusions: . sodium chloride    . heparin    . sodium chloride       LOS: 2 days      Time spent: 25 minutes   Dessa Phi, DO [facility] 11/06/2019, 11:55 AM   Available via Epic secure chat 7am-7pm After these hours, please refer to coverage provider listed on [email or web address]

## 2019-11-06 NOTE — Progress Notes (Signed)
[place] for heparin, warfarin  Indication: mechanical AVR  Allergies  Allergen Reactions  . Darvon [Propoxyphene] Nausea And Vomiting  . Lisinopril Cough  . Septra [Sulfamethoxazole-Trimethoprim] Other (See Comments)    Increased INR  . Warfarin And Related Other (See Comments)    ONLY TOLERATES BRAND  . Penicillins Rash    Did it involve swelling of the face/tongue/throat, SOB, or low BP? No Did it involve sudden or severe rash/hives, skin peeling, or any reaction on the inside of your mouth or nose? No Did you need to seek medical attention at a hospital or doctor's office? No When did it last happen?15 years If all above answers are "NO", may proceed with cephalosporin use.    Patient Measurements: Height: 5' (152.4 cm) Weight: 139 lb 12.4 oz (63.4 kg) IBW/kg (Calculated) : 45.5   Vital Signs: Temp: 97.5 F (36.4 C) (02/14 1117) Temp Source: Oral (02/14 1117) BP: 144/56 (02/14 1117) Pulse Rate: 89 (02/14 1050)  Labs: Recent Labs     0000 11/03/19 1539 11/03/19 1539 11/03/19 1651 11/04/19 0337 11/04/19 0453 11/04/19 1029 11/04/19 1907 11/04/19 1907 11/05/19 0420 11/06/19 0335  HGB  --  5.6*   < >  --  8.0*  --    < > 8.7*   < > 7.8* 8.6*  HCT  --  19.6*   < >  --  24.8*  --    < > 26.8*  --  24.8* 27.9*  PLT  --  206  --   --   --   --   --   --   --  148* 178  APTT  --   --   --  36  --   --   --   --   --   --   --   LABPROT   < >  --   --  31.6*  --   --   --  24.5*  --  24.9* 17.8*  INR   < >  --   --  3.1*  --   --   --  2.2*  --  2.3* 1.5*  CREATININE  --  2.00*   < >  --  1.93*  --   --   --   --  1.64* 1.45*  TROPONINIHS  --   --   --   --  92* 116*  --   --   --   --   --    < > = values in this interval not displayed.    Estimated Creatinine Clearance: 24 mL/min (A) (by C-G formula based on SCr of 1.45 mg/dL (H)).   Medical History: Past Medical History:  Diagnosis Date  . Anemia   . Anginal  pain ([place])   . Arteriovenous malformation of gastrointestinal tract   . Arthritis    "fingers mostly" (04/12/2015)  . Carotid artery disease ([place])    Carotid US 3/17:  20-94% RICA; 70-96% LICA; Elevated bilateral subclavian artery velocities >>f/u 1 year. // Carotid US 4/18: R 40-59; L 1-39 >> FU 1 year // Carotid US 10/2018: R 40-59; L 1-39, L subclavian stenosis   . CHF (congestive heart failure) ([place])   . Chronic kidney disease (CKD), stage III (moderate)   . Chronic lower GI bleeding    "today; last time was ~ 8 yr ago; used to have them often before that too" (02/11/2013)  . Coronary artery disease   .  DJD (degenerative joint disease)   . Dysrhythmia   . Heart murmur   . History of blood transfusion    "a few times over the years; usually related to my Coumadin" (04/12/2015  . History of gout   . Hyperlipemia   . Hypertension   . Macula lutea degeneration   . Old MI (myocardial infarction)    "a coulple /dr in 02/2008; I never even knew I'd had them" (04/12/2015)  . Type II diabetes mellitus (H[facility])     Medications:  Medications Prior to Admission  Medication Sig Dispense Refill Last Dose  . acetaminophen (TYLENOL) 500 MG tablet Take 500 mg by mouth every 6 (six) hours as needed for mild pain or headache.   unk at [employer]  . amiodarone (PACERONE) 200 MG tablet Take 1 tablet (200 mg total) by mouth daily. 90 tablet 3 11/03/2019 at am  . aspirin EC 81 MG tablet Take 1 tablet (81 mg total) by mouth daily.   11/03/2019 at 0930  . atorvastatin (LIPITOR) 80 MG tablet Take 1 tablet by mouth once daily (Patient taking differently: Take 80 mg by mouth at bedtime. ) 90 tablet 3 11/02/2019 at pm  . bevacizumab (AVASTIN) 1.25 mg/0.1 mL SOLN Place 1.25 mg into the left eye every 8 (eight) weeks.   Past Week at Unknown time  . digoxin (LANOXIN) 0.125 MG tablet Take 0.125 mg by mouth daily.   11/03/2019 at am  . ezetimibe (ZETIA) 10 MG tablet TAKE 1 TABLET BY MOUTH ONCE DAILY PLEASE  MAKE  OVERDUE   APPOINTMENT  WITH  DR  Irish Lack  BEFORE  ANYMORE  REFILLS (Patient taking differently: Take 10 mg by mouth at bedtime. ) 90 tablet 3 11/02/2019 at pm  . FeFum-FePo-FA-B Cmp-C-Zn-Mn-Cu (SE-TAN PLUS) 162-115.2-1 MG CAPS Take 1 capsule by mouth 2 (two) times daily.   3 11/03/2019 at Unknown time  . folic acid (FOLVITE) 353 MCG tablet Take 400 mcg by mouth every evening.    11/02/2019 at pm  . furosemide (LASIX) 20 MG tablet Take 1 tablet (20 mg total) by mouth daily. You may take an extra 20 mg as needed for 2-3 lb weight gain overnight (Patient taking differently: Take 20 mg by mouth See admin instructions. Take 20 mg by mouth in the morning and and additional 20 mg as needed for an overnight weight gain of 2-3 pounds) 90 tablet 3 11/03/2019 at am  . isosorbide mononitrate (IMDUR) 60 MG 24 hr tablet Take 1 tablet (60 mg total) by mouth daily. 90 tablet 3 11/03/2019 at am  . JANTOVEN 5 MG tablet TAKE 1/2 TO 1 (ONE-HALF TO ONE) TABLET BY MOUTH ONCE DAILY AS  DIRECTED  BY  COUMADIN  CLINIC (Patient taking differently: Take 2.5-5 mg by mouth See admin instructions. Take 2.5 mg by mouth at bedtime on Sun/Wed/Fri and 5 mg on Mon/Tues/Thurs/Sat) 90 tablet 0 11/02/2019 at 2100  . JANUVIA 50 MG tablet Take 50 mg by mouth daily.    11/03/2019 at am  . losartan (COZAAR) 50 MG tablet Take 50 mg by mouth daily.   11/03/2019 at am  . metoprolol tartrate (LOPRESSOR) 25 MG tablet Take 1 tablet (25 mg total) by mouth daily. (Patient taking differently: Take 25 mg by mouth 2 (two) times daily. ) 90 tablet 3 11/03/2019 at Unknown time  . Multiple Vitamin (MULTIVITAMIN WITH MINERALS) TABS Take 1 tablet by mouth daily.   11/03/2019 at Unknown time  . Multiple Vitamins-Minerals (PRESERVISION AREDS 2 PO) Take  1 tablet by mouth 2 (two) times daily.    11/03/2019 at am  . nitroGLYCERIN (NITROSTAT) 0.4 MG SL tablet Place 1 tablet (0.4 mg total) under the tongue every 5 (five) minutes as needed for chest pain. Please make appt for January. 1st  attempt (Patient taking differently: Place 0.4 mg under the tongue every 5 (five) minutes as needed for chest pain. ) 25 tablet 0 unk at unk  . Omega-3 Fatty Acids (FISH OIL PO) Take 1 capsule by mouth in the morning and at bedtime.   11/03/2019 at am  . potassium chloride (KLOR-CON) 10 MEQ tablet Take 10 mEq by mouth 2 (two) times daily.   11/03/2019 at am    Assessment: 86 yo female with GIB. She in on warfarin PTA and this is on hold with plans for enteroscopy when INR < 2.0. Pharmacy consulted to start heparin when INR < 2.0. Likely enteroscopy today -INR= 1.5, hg= 8.6  Goal of Therapy:  Heparin level 0.3-0.5 Monitor platelets by anticoagulation protocol: Yes   Plan:  -Start heparin at 900 units/hr -Heparin level in 8 hours and daily wth CBC daily    Hildred Laser, PharmD Clinical Pharmacist **Pharmacist phone directory can now be found on [email or web address] ([email or web address]).  Listed under [place].  addendum -pharmacy to restart heparin 4 hours post procedure and okay to resume coumadin  Plan -Restart heparin at 900 units/hr at 3pm -Heparin level in 8 hours and daily wth CBC daily -Coumadin 2.5mg  po today  -Daily PT/INR, heparin level and CBC   Hildred Laser, PharmD Clinical Pharmacist **Pharmacist phone directory can now be found on [email or web address] ([email or web address]).  Listed under [place].

## 2019-11-06 NOTE — Anesthesia Preprocedure Evaluation (Signed)
Anesthesia Evaluation  Patient identified by MRN, date of birth, ID band Patient awake    Reviewed: Allergy & Precautions, NPO status , Patient's Chart, lab work & pertinent test results  History of Anesthesia Complications Negative for: history of anesthetic complications  Airway Mallampati: II  TM Distance: >3 FB Neck ROM: Full    Dental  (+) Partial Lower, Partial Upper, Dental Advisory Given   Pulmonary neg recent URI,    breath sounds clear to auscultation       Cardiovascular hypertension, Pt. on medications (-) angina+ CAD, + Past MI and +CHF  + dysrhythmias Atrial Fibrillation + Valvular Problems/Murmurs  Rhythm:Irregular + Systolic Click 1. The left ventricle has hyperdynamic systolic function, with an  ejection fraction of >65%. The cavity size was normal.  2. The right ventricle has normal systolc function. The cavity was  normal. There is no increase in right ventricular wall thickness.  3. Mild mitral valve prolapse.  4. Mitral valve regurgitation is mild to moderate by color flow Doppler.  5. Tricuspid valve regurgitation is moderate.  6. Aortic valve regurgitation mild perivalvular leak.  7. Pulmonic valve regurgitation was not assessed by color flow Doppler.  8. There is evidence of plaque in the descending aorta.  9. No vegetations, no endocarditis.     Neuro/Psych negative neurological ROS  negative psych ROS   GI/Hepatic Neg liver ROS, ? GI bleed   Endo/Other  diabetes  Renal/GU CRFRenal disease     Musculoskeletal  (+) Arthritis ,   Abdominal   Peds  Hematology  (+) anemia ,   Anesthesia Other Findings   Reproductive/Obstetrics                             Anesthesia Physical Anesthesia Plan  ASA: III  Anesthesia Plan: MAC   Post-op Pain Management:    Induction: Intravenous  PONV Risk Score and Plan: 2 and Treatment may vary due to age or medical  condition and Propofol infusion  Airway Management Planned: Nasal Cannula  Additional Equipment: None  Intra-op Plan:   Post-operative Plan:   Informed Consent: I have reviewed the patients History and Physical, chart, labs and discussed the procedure including the risks, benefits and alternatives for the proposed anesthesia with the patient or authorized representative who has indicated his/her understanding and acceptance.     Dental advisory given  Plan Discussed with: CRNA and Surgeon  Anesthesia Plan Comments:         Anesthesia Quick Evaluation

## 2019-11-06 NOTE — Op Note (Signed)
[facility] Patient Name: [name] Procedure Date : 11/06/2019 MRN: [id number] Attending MD: Estill Cotta. Loletha Carrow , MD Date of Birth: 1932/10/07 CSN: [id number] Age: 86 Admit Type: Inpatient Procedure:                Small bowel enteroscopy Indications:              Melena; Anemia (acute post-hemorrhagic, CKD,                            macrocytic). 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 on chronic aspirin/coumadin Providers:                Mallie Mussel L. Loletha Carrow, MD, Vista Lawman, RN, Laverda Sorenson, Technician, Corie Chiquito, Technician, Cira Servant, CRNA Referring MD:             [facility] Medicines:                Monitored Anesthesia Care Complications:            No immediate complications. Estimated Blood Loss:     Estimated blood loss was minimal. Procedure:                Pre-Anesthesia Assessment:                           - Prior to the procedure, a History and Physical                            was performed, and patient medications and                            allergies were reviewed. The patient's tolerance of                            previous anesthesia was also reviewed. The risks                            and benefits of the procedure and the sedation                            options and risks were discussed with the patient.                            All questions were answered, and informed consent                            was obtained. Prior Anticoagulants: The patient has  taken Coumadin (warfarin), last dose was 3 days                            prior to procedure. ASA Grade Assessment: III - A                            patient with severe systemic disease. After                            reviewing the risks and benefits, the patient was                            deemed in satisfactory condition to undergo the               procedure.                           After obtaining informed consent, the endoscope was                            passed under direct vision. Throughout the                            procedure, the patient's blood pressure, pulse, and                            oxygen saturations were monitored continuously. The                            PCF-[id number] ([id number] Olympus pediatric colonscope                            was introduced through the mouth and advanced to                            the proximal jejunum. The small bowel enteroscopy                            was somewhat difficult due to significant looping.                            The patient tolerated the procedure fairly well. Scope In: Scope Out: Findings:      The esophagus was normal.      One non-bleeding cratered gastric ulcer with no stigmata of bleeding was       found in the gastric body, on the greater curvature. The lesion was 6 mm       in largest dimension. Benign-appearing, but inflamed, heaped edges. Two       biopsies were taken with a cold forceps for histology. (Jar 2)      One non-bleeding superficial gastric ulcer with no stigmata of bleeding       was found in the prepyloric region of the stomach. The lesion was 2 mm       in largest dimension. One biopsy was obtained in the prepyloric region  of the stomach with cold forceps for histology. (Jar 1)      The exam of the stomach was otherwise normal.      The examined duodenum was normal.      A single angioectasia with no bleeding was found in the proximal       jejunum. Coagulation for bleeding prevention using argon plasma at 0.5       liters/minute and 20 watts was successful. Impression:               - Normal esophagus.                           - Non-bleeding gastric ulcer with no stigmata of                            bleeding. Biopsied.                           - Non-bleeding gastric ulcer with no stigmata of                             bleeding.                           - Normal examined duodenum.                           - A single non-bleeding angioectasia in the                            jejunum. Treated with argon plasma coagulation                            (APC).                           - One biopsy was obtained in the prepyloric region                            of the stomach.                           Multifactorial macrocytic anemia - CKD, acute on                            chronic GI blood loss (ulcer, AVMs), tests pending                            for B12 anf folate. Recommendation:           - Return patient to hospital ward for ongoing care.                           - Resume regular diet today.                           - Use Protonix (pantoprazole) 40 mg PO BID x 6  weeks, then once daily indefinitely since patient                            must resume aspirin soon for CAD.                           - no aspirin for 4 weeks, then resume at 81 mg                            daily.                           - Begin unfractionated heparin in 4 hours. Coumadin                            can be resumed this evening.                           - Close outpatient follow up with PCP and GI to                            check CBC and iron studies regularly.                           - Check results of B12 and folate leves - replace                            as needed.                           - Upon discharge, once daily slow release iron                            tablet for 6 weeks. Procedure Code(s):        --- Professional ---                           [phone], 41, Small intestinal endoscopy, enteroscopy                            beyond second portion of duodenum, not including                            ileum; with control of bleeding (eg, injection,                            bipolar cautery, unipolar cautery, laser, heater                            probe,  stapler, plasma coagulator)                           [id number], Small intestinal endoscopy, enteroscopy  beyond second portion of duodenum, not including                            ileum; with biopsy, single or multiple Diagnosis Code(s):        --- Professional ---                           K25.9, Gastric ulcer, unspecified as acute or                            chronic, without hemorrhage or perforation                           K55.20, Angiodysplasia of colon without hemorrhage                           K92.1, Melena (includes Hematochezia) CPT copyright 2019 American Medical Association. All rights reserved. The codes documented in this report are preliminary and upon coder review may  be revised to meet current compliance requirements. Camille Dragan L. Loletha Carrow, MD 11/06/2019 10:39:44 AM This report has been signed electronically. Number of Addenda: 0

## 2019-11-06 NOTE — Anesthesia Postprocedure Evaluation (Signed)
Anesthesia Post Note  Patient: CARMITA BOOM  Procedure(s) Performed: ENTEROSCOPY (N/A ) HOT HEMOSTASIS (ARGON PLASMA COAGULATION/BICAP) (N/A ) BIOPSY     Patient location during evaluation: Endoscopy Anesthesia Type: MAC Level of consciousness: awake and alert Pain management: pain level controlled Vital Signs Assessment: post-procedure vital signs reviewed and stable Respiratory status: spontaneous breathing, nonlabored ventilation, respiratory function stable and patient connected to nasal cannula oxygen Cardiovascular status: stable and blood pressure returned to baseline Postop Assessment: no apparent nausea or vomiting Anesthetic complications: no    Last Vitals:  Vitals:   11/06/19 1040 11/06/19 1050  BP: (!) 123/43 (!) 146/54  Pulse: 89 89  Resp: 20 (!) 22  Temp:    SpO2: 99% 100%    Last Pain:  Vitals:   11/06/19 1050  TempSrc:   PainSc: 0-No pain                 Athziri Freundlich

## 2019-11-06 NOTE — Anesthesia Procedure Notes (Signed)
Procedure Name: MAC Date/Time: 11/06/2019 10:05 AM Performed by: Lowella Dell, CRNA Pre-anesthesia Checklist: Patient identified, Emergency Drugs available, Suction available, Patient being monitored and Timeout performed Patient Re-evaluated:Patient Re-evaluated prior to induction Oxygen Delivery Method: Nasal cannula Induction Type: IV induction Placement Confirmation: positive ETCO2 Dental Injury: Teeth and Oropharynx as per pre-operative assessment

## 2019-11-06 NOTE — Transfer of Care (Signed)
Immediate Anesthesia Transfer of Care Note  Patient: [name]  Procedure(s) Performed: ENTEROSCOPY (N/A ) HOT HEMOSTASIS (ARGON PLASMA COAGULATION/BICAP) (N/A ) BIOPSY  Patient Location: PACU and Endoscopy Unit  Anesthesia Type:MAC  Level of Consciousness: drowsy and patient cooperative  Airway & Oxygen Therapy: Patient Spontanous Breathing and Patient connected to nasal cannula oxygen  Post-op Assessment: Report given to RN, Post -op Vital signs reviewed and stable and Patient moving all extremities  Post vital signs: Reviewed and stable  Last Vitals:  Vitals Value Taken Time  BP 109/92 11/06/19 1031  Temp    Pulse 84 11/06/19 1031  Resp 20 11/06/19 1031  SpO2 100 % 11/06/19 1031  Vitals shown include unvalidated device data.  Last Pain:  Vitals:   11/06/19 0926  TempSrc: Oral  PainSc: 0-No pain         Complications: No apparent anesthesia complications

## 2019-11-07 LAB — GLUCOSE, CAPILLARY
Glucose-Capillary: 94 mg/dL (ref 70–99)
Glucose-Capillary: 133 mg/dL — ABNORMAL HIGH (ref 70–99)
Glucose-Capillary: 178 mg/dL — ABNORMAL HIGH (ref 70–99)
Glucose-Capillary: 122 mg/dL — ABNORMAL HIGH (ref 70–99)
Glucose-Capillary: 110 mg/dL — ABNORMAL HIGH (ref 70–99)

## 2019-11-07 LAB — PROTIME-INR
INR: 1.4 — ABNORMAL HIGH (ref 0.8–1.2)
Prothrombin Time: 16.8 seconds — ABNORMAL HIGH (ref 11.4–15.2)

## 2019-11-07 LAB — CBC
HCT: 27.4 % — ABNORMAL LOW (ref 36.0–46.0)
Hemoglobin: 8.5 g/dL — ABNORMAL LOW (ref 12.0–15.0)
MCH: 31.7 pg (ref 26.0–34.0)
MCHC: 31 g/dL (ref 30.0–36.0)
MCV: 102.2 fL — ABNORMAL HIGH (ref 80.0–100.0)
Platelets: 211 10*3/uL (ref 150–400)
RBC: 2.68 MIL/uL — ABNORMAL LOW (ref 3.87–5.11)
RDW: 20.6 % — ABNORMAL HIGH (ref 11.5–15.5)
WBC: 6.5 10*3/uL (ref 4.0–10.5)
nRBC: 0 % (ref 0.0–0.2)

## 2019-11-07 LAB — FOLATE: Folate: 45.4 ng/mL (ref >5.9)

## 2019-11-07 LAB — VITAMIN B12: Vitamin B-12: 1072 pg/mL — ABNORMAL HIGH (ref 180–914)

## 2019-11-07 LAB — HEPARIN LEVEL (UNFRACTIONATED)
Heparin Unfractionated: 0.77 IU/mL — ABNORMAL HIGH (ref 0.30–0.70)
Heparin Unfractionated: 0.91 IU/mL — ABNORMAL HIGH (ref 0.30–0.70)
Heparin Unfractionated: <0.1 IU/mL — ABNORMAL LOW (ref 0.30–0.70)

## 2019-11-07 MED ORDER — HEPARIN (PORCINE) 25000 UT/250ML-% IV SOLN
500.0000 [IU]/h | INTRAVENOUS | Status: DC
  Administered 2019-11-07: 550 [IU]/h via INTRAVENOUS
  Filled 2019-11-07: qty 250

## 2019-11-07 MED ORDER — FUROSEMIDE 10 MG/ML IJ SOLN
40.0000 mg | Freq: Once | INTRAMUSCULAR | Status: AC
  Administered 2019-11-07: 40 mg via INTRAVENOUS
  Filled 2019-11-07: qty 4

## 2019-11-07 MED ORDER — WARFARIN SODIUM 5 MG PO TABS
5.0000 mg | ORAL_TABLET | Freq: Once | ORAL | Status: AC
  Administered 2019-11-07: 5 mg via ORAL
  Filled 2019-11-07: qty 1

## 2019-11-07 NOTE — Plan of Care (Signed)

## 2019-11-07 NOTE — Progress Notes (Signed)
[place] for heparin, warfarin  Indication: mechanical AVR  Allergies  Allergen Reactions  . Darvon [Propoxyphene] Nausea And Vomiting  . Lisinopril Cough  . Septra [Sulfamethoxazole-Trimethoprim] Other (See Comments)    Increased INR  . Warfarin And Related Other (See Comments)    ONLY TOLERATES BRAND  . Penicillins Rash    Did it involve swelling of the face/tongue/throat, SOB, or low BP? No Did it involve sudden or severe rash/hives, skin peeling, or any reaction on the inside of your mouth or nose? No Did you need to seek medical attention at a hospital or doctor's office? No When did it last happen?15 years If all above answers are "NO", may proceed with cephalosporin use.    Patient Measurements: Height: 5' (152.4 cm) Weight: 139 lb 12.4 oz (63.4 kg) IBW/kg (Calculated) : 45.5   Vital Signs: Temp: 98 F (36.7 C) (02/15 1732) BP: 118/69 (02/15 1732) Pulse Rate: 87 (02/15 1732)  Labs: Recent Labs    11/05/19 0420 11/05/19 0420 11/06/19 0335 11/06/19 2345 11/07/19 0744 11/07/19 1838  HGB 7.8*   < > 8.6*  --  8.5*  --   HCT 24.8*  --  27.9*  --  27.4*  --   PLT 148*  --  178  --  211  --   LABPROT 24.9*  --  17.8*  --  16.8*  --   INR 2.3*  --  1.5*  --  1.4*  --   HEPARINUNFRC  --   --   --  0.77* 0.91* <0.10*  CREATININE 1.64*  --  1.45*  --   --   --    < > = values in this interval not displayed.    Estimated Creatinine Clearance: 24 mL/min (A) (by C-G formula based on SCr of 1.45 mg/dL (H)).  Assessment: 86 yo female with GIB. She in on warfarin PTA; sp enteroscopy  Restarted warfarin last night and remains on heparin gtt  Hep lvl this pm subtherapeutic at <0.1, INR 1.4  CBC Stable  Goal of Therapy:  Heparin level 0.3-0.5 INR 2 - 2.5 Monitor platelets by anticoagulation protocol: Yes   Plan:  Increase heparin to 650 units/hr  0430 HL Daily HL CBC INR  Alanda Slim, PharmD, [facility] Clinical  Pharmacist Please see AMION for all Pharmacists' Contact Phone Numbers 11/07/2019, 8:09 PM

## 2019-11-07 NOTE — Progress Notes (Signed)
[place] for heparin, warfarin  Indication: mechanical AVR  Allergies  Allergen Reactions  . Darvon [Propoxyphene] Nausea And Vomiting  . Lisinopril Cough  . Septra [Sulfamethoxazole-Trimethoprim] Other (See Comments)    Increased INR  . Warfarin And Related Other (See Comments)    ONLY TOLERATES BRAND  . Penicillins Rash    Did it involve swelling of the face/tongue/throat, SOB, or low BP? No Did it involve sudden or severe rash/hives, skin peeling, or any reaction on the inside of your mouth or nose? No Did you need to seek medical attention at a hospital or doctor's office? No When did it last happen?15 years If all above answers are "NO", may proceed with cephalosporin use.    Patient Measurements: Height: 5' (152.4 cm) Weight: 139 lb 12.4 oz (63.4 kg) IBW/kg (Calculated) : 45.5   Vital Signs: Temp: 97.4 F (36.3 C) (02/15 0808) BP: 142/52 (02/15 0808) Pulse Rate: 87 (02/15 0808)  Labs: Recent Labs    11/05/19 0420 11/05/19 0420 11/06/19 0335 11/06/19 2345 11/07/19 0744  HGB 7.8*   < > 8.6*  --  8.5*  HCT 24.8*  --  27.9*  --  27.4*  PLT 148*  --  178  --  211  LABPROT 24.9*  --  17.8*  --  16.8*  INR 2.3*  --  1.5*  --  1.4*  HEPARINUNFRC  --   --   --  0.77* 0.91*  CREATININE 1.64*  --  1.45*  --   --    < > = values in this interval not displayed.    Estimated Creatinine Clearance: 24 mL/min (A) (by C-G formula based on SCr of 1.45 mg/dL (H)).  Assessment: 86 yo female with GIB. She in on warfarin PTA; sp enteroscopy  Restarted warfarin last night and remains on heparin gtt  Hep lvl this am high at 0.91, INR 1.4  No bleeding per RN  CBC Stable  Goal of Therapy:  Heparin level 0.3-0.5 INR 2 - 2.5 Monitor platelets by anticoagulation protocol: Yes   Plan:  Hold heparin x 1 hr Warfarin 5 mg x 1 Decrease heparin to 550 units/hr @ 1100 1800 HL Daily HL CBC INR  Barth Kirks, PharmD, BCPS,  BCCCP Clinical Pharmacist [phone]  Please check AMION for all [place] numbers  11/07/2019 9:48 AM

## 2019-11-07 NOTE — Progress Notes (Signed)
[place] for Heparin  Indication: mechanical AVR  Allergies  Allergen Reactions  . Darvon [Propoxyphene] Nausea And Vomiting  . Lisinopril Cough  . Septra [Sulfamethoxazole-Trimethoprim] Other (See Comments)    Increased INR  . Warfarin And Related Other (See Comments)    ONLY TOLERATES BRAND  . Penicillins Rash    Did it involve swelling of the face/tongue/throat, SOB, or low BP? No Did it involve sudden or severe rash/hives, skin peeling, or any reaction on the inside of your mouth or nose? No Did you need to seek medical attention at a hospital or doctor's office? No When did it last happen?15 years If all above answers are "NO", may proceed with cephalosporin use.    Patient Measurements: Height: 5' (152.4 cm) Weight: 139 lb 12.4 oz (63.4 kg) IBW/kg (Calculated) : 45.5   Vital Signs: Temp: 97.7 F (36.5 C) (02/14 2102) Temp Source: Oral (02/14 2102) BP: 116/49 (02/14 2102) Pulse Rate: 91 (02/14 2102)  Labs: Recent Labs    11/04/19 0337 11/04/19 0453 11/04/19 1029 11/04/19 1907 11/04/19 1907 11/05/19 0420 11/06/19 0335 11/06/19 2345  HGB 8.0*  --    < > 8.7*   < > 7.8* 8.6*  --   HCT 24.8*  --    < > 26.8*  --  24.8* 27.9*  --   PLT  --   --   --   --   --  148* 178  --   LABPROT  --   --   --  24.5*  --  24.9* 17.8*  --   INR  --   --   --  2.2*  --  2.3* 1.5*  --   HEPARINUNFRC  --   --   --   --   --   --   --  0.77*  CREATININE 1.93*  --   --   --   --  1.64* 1.45*  --   TROPONINIHS 92* 116*  --   --   --   --   --   --    < > = values in this interval not displayed.    Estimated Creatinine Clearance: 24 mL/min (A) (by C-G formula based on SCr of 1.45 mg/dL (H)).   Medical History: Past Medical History:  Diagnosis Date  . Anemia   . Anginal pain ([place])   . Arteriovenous malformation of gastrointestinal tract   . Arthritis    "fingers mostly" (04/12/2015)  . Carotid artery disease ([place])    Carotid US  3/17:  95-09% RICA; 32-67% LICA; Elevated bilateral subclavian artery velocities >>f/u 1 year. // Carotid US 4/18: R 40-59; L 1-39 >> FU 1 year // Carotid US 10/2018: R 40-59; L 1-39, L subclavian stenosis   . CHF (congestive heart failure) ([place])   . Chronic kidney disease (CKD), stage III (moderate)   . Chronic lower GI bleeding    "today; last time was ~ 8 yr ago; used to have them often before that too" (02/11/2013)  . Coronary artery disease   . DJD (degenerative joint disease)   . Dysrhythmia   . Heart murmur   . History of blood transfusion    "a few times over the years; usually related to my Coumadin" (04/12/2015  . History of gout   . Hyperlipemia   . Hypertension   . Macula lutea degeneration   . Old MI (myocardial infarction)    "a coulple /dr in 02/2008; I never  even knew I'd had them" (04/12/2015)  . Type II diabetes mellitus (HCC)     Medications:  Medications Prior to Admission  Medication Sig Dispense Refill Last Dose  . acetaminophen (TYLENOL) 500 MG tablet Take 500 mg by mouth every 6 (six) hours as needed for mild pain or headache.   unk at [employer]  . amiodarone (PACERONE) 200 MG tablet Take 1 tablet (200 mg total) by mouth daily. 90 tablet 3 11/03/2019 at am  . aspirin EC 81 MG tablet Take 1 tablet (81 mg total) by mouth daily.   11/03/2019 at 0930  . atorvastatin (LIPITOR) 80 MG tablet Take 1 tablet by mouth once daily (Patient taking differently: Take 80 mg by mouth at bedtime. ) 90 tablet 3 11/02/2019 at pm  . bevacizumab (AVASTIN) 1.25 mg/0.1 mL SOLN Place 1.25 mg into the left eye every 8 (eight) weeks.   Past Week at Unknown time  . digoxin (LANOXIN) 0.125 MG tablet Take 0.125 mg by mouth daily.   11/03/2019 at am  . ezetimibe (ZETIA) 10 MG tablet TAKE 1 TABLET BY MOUTH ONCE DAILY PLEASE  MAKE  OVERDUE  APPOINTMENT  WITH  DR  Irish Lack  BEFORE  ANYMORE  REFILLS (Patient taking differently: Take 10 mg by mouth at bedtime. ) 90 tablet 3 11/02/2019 at pm  . FeFum-FePo-FA-B  Cmp-C-Zn-Mn-Cu (SE-TAN PLUS) 162-115.2-1 MG CAPS Take 1 capsule by mouth 2 (two) times daily.   3 11/03/2019 at Unknown time  . folic acid (FOLVITE) 778 MCG tablet Take 400 mcg by mouth every evening.    11/02/2019 at pm  . furosemide (LASIX) 20 MG tablet Take 1 tablet (20 mg total) by mouth daily. You may take an extra 20 mg as needed for 2-3 lb weight gain overnight (Patient taking differently: Take 20 mg by mouth See admin instructions. Take 20 mg by mouth in the morning and and additional 20 mg as needed for an overnight weight gain of 2-3 pounds) 90 tablet 3 11/03/2019 at am  . isosorbide mononitrate (IMDUR) 60 MG 24 hr tablet Take 1 tablet (60 mg total) by mouth daily. 90 tablet 3 11/03/2019 at am  . JANTOVEN 5 MG tablet TAKE 1/2 TO 1 (ONE-HALF TO ONE) TABLET BY MOUTH ONCE DAILY AS  DIRECTED  BY  COUMADIN  CLINIC (Patient taking differently: Take 2.5-5 mg by mouth See admin instructions. Take 2.5 mg by mouth at bedtime on Sun/Wed/Fri and 5 mg on Mon/Tues/Thurs/Sat) 90 tablet 0 11/02/2019 at 2100  . JANUVIA 50 MG tablet Take 50 mg by mouth daily.    11/03/2019 at am  . losartan (COZAAR) 50 MG tablet Take 50 mg by mouth daily.   11/03/2019 at am  . metoprolol tartrate (LOPRESSOR) 25 MG tablet Take 1 tablet (25 mg total) by mouth daily. (Patient taking differently: Take 25 mg by mouth 2 (two) times daily. ) 90 tablet 3 11/03/2019 at Unknown time  . Multiple Vitamin (MULTIVITAMIN WITH MINERALS) TABS Take 1 tablet by mouth daily.   11/03/2019 at Unknown time  . Multiple Vitamins-Minerals (PRESERVISION AREDS 2 PO) Take 1 tablet by mouth 2 (two) times daily.    11/03/2019 at am  . nitroGLYCERIN (NITROSTAT) 0.4 MG SL tablet Place 1 tablet (0.4 mg total) under the tongue every 5 (five) minutes as needed for chest pain. Please make appt for January. 1st attempt (Patient taking differently: Place 0.4 mg under the tongue every 5 (five) minutes as needed for chest pain. ) 25 tablet 0 unk at  unk  . Omega-3 Fatty Acids  (FISH OIL PO) Take 1 capsule by mouth in the morning and at bedtime.   11/03/2019 at am  . potassium chloride (KLOR-CON) 10 MEQ tablet Take 10 mEq by mouth 2 (two) times daily.   11/03/2019 at am    Assessment: 86 yo female with GIB. She in on warfarin PTA and this is on hold with plans for enteroscopy when INR < 2.0. Pharmacy consulted to start heparin when INR < 2.0. Likely enteroscopy today -INR= 1.5, hg= 8.6  2/15 AM update:  Heparin level elevated  No issues per RN Warfarin also re-started earlier, INR 1.5  Goal of Therapy:  Heparin level 0.3-0.5 units/mL Monitor platelets by anticoagulation protocol: Yes   Plan:  -Dec heparin to 750 units/hr -Re-check heparin level at [place], PharmD, [place] Pharmacist Phone: [phone]

## 2019-11-07 NOTE — Progress Notes (Signed)
PROGRESS NOTE    [name]  QQI:[id number] DOB: 13-Feb-1933 DOA: 11/03/2019 PCP: Gaynelle Arabian, MD     Brief Narrative:  [name] is an 86 year old Caucasian female with past medical history significant for atrial fibrillation, status post mechanical aortic valve replacement, chronic kidney disease stage III, diastolic congestive heart failure with last echocardiogram revealed an ejection fraction of greater than 65%, chronic lower GI bleed, AV malformation, MI, coronary artery disease, diabetes mellitus, hypertension and hyperlipidemia.  Patient is on Coumadin for atrial fibrillation and prostatic aortic heart valve.  On 11/01/2019, INR was 4.7.  Patient's Coumadin has been on hold.  Patient reported significant dark blood per rectum 5 days ago, and has had intermittent rectal bleed since then, though not as significant. On presentation to the hospital, patient's hemoglobin was 5.6 g/dL (baseline of 10.9 g/dL).  Patient admitted for GI bleed and blood transfusion.  GI consulted. Patient underwent small bowel enteroscopy 2/14 and resumed on coumadin/heparin.   New events last 24 hours / Subjective: Sitting in bed.  No further bowel movements to report.  No complaints this morning and wants to go home.  Tolerating diet  Assessment & Plan:   Active Problems:   H/O mechanical aortic valve replacement   Symptomatic anemia   Acute GI bleeding   GI bleed with blood loss anemia -Patient with history of small bowel AVM -FOBT positive -Patient transfused 2 unit packed red blood cell on 2/11 -Hgb stable 8.5 this morning  -S/p small bowel enteroscopy 2/14:    Non-bleeding gastric ulcer with no stigmata of bleeding  A single non-bleeding angioectasia in the jejunum. Treated with argon plasma coagulation (APC).  Use Protonix (pantoprazole) 40 mg PO BID x 6 weeks, then once daily indefinitely   No aspirin for 4 weeks, then resume at 81 mg daily.  Resume IV heparin in 4 hours. Resume  coumadin tonight   Upon discharge, once daily slow release iron tablet for 6 weeks. -Folate 45.4, vitamin B12 1072 -Currently on IV heparin, Coumadin  Paroxysmal atrial fibrillation -Continue amiodarone, digoxin -Resume coumadin. Goal INR has been 2-2.5 as outpatient.  -Cardiology following  Status post prosthetic aortic valve replacement -Resume coumadin. Goal INR has been 2-2.5 as outpatient.  -Cardiology following   Type 2 diabetes -Sliding scale insulin  AKI on CKD stage IIIb-IV -Baseline creatinine 1.5 -Hold Cozaar -Stable Cr 1.45   Hypertension -Continue Lopressor  CAD -Continue Imdur, hold aspirin  Hyperlipidemia -Continue Lipitor, Zetia  Chronic diastolic HF  -Continue home lasix     DVT prophylaxis: SCD, IV heparin, Coumadin Code Status: Full code Family Communication: No family at bedside Disposition Plan:  . Patient is from home prior to admission. . Currently in-hospital treatment needed due IV heparin bridging with Coumadin . Suspect patient will discharge home hopefully tomorrow 2/16 as long as INR >1.8   Consultants:   GI  Cardiology  Procedures:   Small bowel enteroscopy 2/14   Antimicrobials:  Anti-infectives (From admission, onward)   None       Objective: Vitals:   11/06/19 1117 11/06/19 1713 11/06/19 2102 11/07/19 0808  BP: (!) 144/56 126/64 (!) 116/49 (!) 142/52  Pulse:  84 91 87  Resp: 20 16 18 20   Temp: (!) 97.5 F (36.4 C) 98.4 F (36.9 C) 97.7 F (36.5 C) (!) 97.4 F (36.3 C)  TempSrc: Oral Oral Oral   SpO2: 100% 100% 99% 97%  Weight:      Height:  Intake/Output Summary (Last 24 hours) at 11/07/2019 1228 Last data filed at 11/07/2019 0500 Gross per 24 hour  Intake --  Output 700 ml  Net -700 ml   Filed Weights   11/03/19 1819 11/03/19 2012  Weight: 60.8 kg 63.4 kg    Examination: General exam: Appears calm and comfortable  Respiratory system: Clear to auscultation. Respiratory effort  normal. Cardiovascular system: S1 & S2 heard, RRR. + Valvular click.  Bilateral +1 peripheral edema Gastrointestinal system: Abdomen is nondistended, soft and nontender. Normal bowel sounds heard. Central nervous system: Alert and oriented. Non focal exam. Speech clear  Extremities: Symmetric in appearance bilaterally  Skin: No rashes, lesions or ulcers on exposed skin  Psychiatry: Judgement and insight appear stable. Mood & affect appropriate.   Data Reviewed: I have personally reviewed following labs and imaging studies  CBC: Recent Labs  Lab 11/03/19 1539 11/04/19 0337 11/04/19 1029 11/04/19 1907 11/05/19 0420 11/06/19 0335 11/07/19 0744  WBC 7.5  --   --   --  5.6 5.9 6.5  HGB 5.6*   < > 8.5* 8.7* 7.8* 8.6* 8.5*  HCT 19.6*   < > 27.5* 26.8* 24.8* 27.9* 27.4*  MCV 108.9*  --   --   --  99.2 102.6* 102.2*  PLT 206  --   --   --  148* 178 211   < > = values in this interval not displayed.   Basic Metabolic Panel: Recent Labs  Lab 11/03/19 1539 11/03/19 1915 11/04/19 0337 11/05/19 0420 11/06/19 0335  NA 133*  --  136 140 137  K 3.9  --  3.8 3.7 4.2  CL 103  --  107 109 103  CO2 22  --  23 22 20*  GLUCOSE 136*  --  92 97 108*  BUN 49*  --  45* 36* 28*  CREATININE 2.00*  --  1.93* 1.64* 1.45*  CALCIUM 8.2*  --  8.1* 8.2* 8.5*  MG  --  2.1  --   --   --   PHOS  --  3.8  --   --   --    GFR: Estimated Creatinine Clearance: 24 mL/min (A) (by C-G formula based on SCr of 1.45 mg/dL (H)). Liver Function Tests: Recent Labs  Lab 11/03/19 1539  AST 30  ALT 30  ALKPHOS 32*  BILITOT 0.8  PROT 5.3*  ALBUMIN 3.2*   No results for input(s): LIPASE, AMYLASE in the last 168 hours. No results for input(s): AMMONIA in the last 168 hours. Coagulation Profile: Recent Labs  Lab 11/03/19 1651 11/04/19 1907 11/05/19 0420 11/06/19 0335 11/07/19 0744  INR 3.1* 2.2* 2.3* 1.5* 1.4*   Cardiac Enzymes: No results for input(s): CKTOTAL, CKMB, CKMBINDEX, TROPONINI in the last  168 hours. BNP (last 3 results) No results for input(s): PROBNP in the last 8760 hours. HbA1C: No results for input(s): HGBA1C in the last 72 hours. CBG: Recent Labs  Lab 11/06/19 2116 11/06/19 2347 11/07/19 0406 11/07/19 0740 11/07/19 1208  GLUCAP 210* 122* 94 133* 178*   Lipid Profile: No results for input(s): CHOL, HDL, LDLCALC, TRIG, CHOLHDL, LDLDIRECT in the last 72 hours. Thyroid Function Tests: No results for input(s): TSH, T4TOTAL, FREET4, T3FREE, THYROIDAB in the last 72 hours. Anemia Panel: Recent Labs    11/07/19 0744  VITAMINB12 1,072*  FOLATE 45.4   Sepsis Labs: No results for input(s): PROCALCITON, LATICACIDVEN in the last 168 hours.  Recent Results (from the past 240 hour(s))  SARS CORONAVIRUS 2 (TAT  6-24 HRS) Nasopharyngeal Nasopharyngeal Swab     Status: None   Collection Time: 11/03/19  5:05 PM   Specimen: Nasopharyngeal Swab  Result Value Ref Range Status   SARS Coronavirus 2 NEGATIVE NEGATIVE Final    Comment: (NOTE) SARS-CoV-2 target nucleic acids are NOT DETECTED. The SARS-CoV-2 RNA is generally detectable in upper and lower respiratory specimens during the acute phase of infection. Negative results do not preclude SARS-CoV-2 infection, do not rule out co-infections with other pathogens, and should not be used as the sole basis for treatment or other patient management decisions. Negative results must be combined with clinical observations, patient history, and epidemiological information. The expected result is Negative. Fact Sheet for Patients: SugarRoll.be Fact Sheet for Healthcare Providers: https://www.woods-mathews.com/ This test is not yet approved or cleared by the [place] FDA and  has been authorized for detection and/or diagnosis of SARS-CoV-2 by FDA under an Emergency Use Authorization (EUA). This EUA will remain  in effect (meaning this test can be used) for the duration of  the COVID-19 declaration under Section 56 4(b)(1) of the Act, 21 U.S.C. section 360bbb-3(b)(1), unless the authorization is terminated or revoked sooner. Performed at [facility], [place] [address], [place], [place] [address]   MRSA PCR Screening     Status: None   Collection Time: 11/03/19  8:11 PM   Specimen: Nasopharyngeal  Result Value Ref Range Status   MRSA by PCR NEGATIVE NEGATIVE Final    Comment:        The GeneXpert MRSA Assay (FDA approved for NASAL specimens only), is one component of a comprehensive MRSA colonization surveillance program. It is not intended to diagnose MRSA infection nor to guide or monitor treatment for MRSA infections. Performed at [facility], [place] [address] [place], [place] [address]       Radiology Studies: No results found.    Scheduled Meds: . amiodarone  200 mg Oral Daily  . atorvastatin  80 mg Oral Daily  . ezetimibe  10 mg Oral QHS  . furosemide  20 mg Oral Daily  . insulin aspart  0-9 Units Subcutaneous TID WC  . isosorbide mononitrate  120 mg Oral Daily  . metoprolol tartrate  25 mg Oral Daily  . warfarin  5 mg Oral ONCE-1800  . Warfarin - Pharmacist Dosing Inpatient   Does not apply q1800   Continuous Infusions: . sodium chloride    . heparin 550 Units/hr (11/07/19 1110)  . sodium chloride       LOS: 3 days      Time spent: 25 minutes   Dessa Phi, DO [facility] 11/07/2019, 12:28 PM   Available via Epic secure chat 7am-7pm After these hours, please refer to coverage provider listed on [email or web address]

## 2019-11-07 NOTE — Progress Notes (Addendum)
Progress Note  Patient Name: [name] Date of Encounter: 11/07/2019  Primary Cardiologist: Larae Grooms, MD   Subjective   Feeling well. No further bleeding. Breathing is at her baseline. Has done well getting up out of bed.   Inpatient Medications    Scheduled Meds: . amiodarone  200 mg Oral Daily  . atorvastatin  80 mg Oral Daily  . ezetimibe  10 mg Oral QHS  . furosemide  20 mg Oral Daily  . insulin aspart  0-9 Units Subcutaneous TID WC  . isosorbide mononitrate  120 mg Oral Daily  . metoprolol tartrate  25 mg Oral Daily  . Warfarin - Pharmacist Dosing Inpatient   Does not apply q1800   Continuous Infusions: . sodium chloride    . heparin 750 Units/hr (11/07/19 0022)  . sodium chloride     PRN Meds: docusate sodium, simethicone   Vital Signs    Vitals:   11/06/19 1117 11/06/19 1713 11/06/19 2102 11/07/19 0808  BP: (!) 144/56 126/64 (!) 116/49 (!) 142/52  Pulse:  84 91 87  Resp: _0 Temp: (!) 97.5 F (36.4 C) 98.4 F (36.9 C) 97.7 F (36.5 C) (!) 97.4 F (36.3 C)  TempSrc: Oral Oral Oral   SpO2: 100% 100% 99% 97%  Weight:      Height:        Intake/Output Summary (Last 24 hours) at 11/07/2019 0819 Last data filed at 11/07/2019 0500 Gross per 24 hour  Intake 175 ml  Output 700 ml  Net -525 ml   Last 3 Weights 11/03/2019 11/03/2019 09/06/2019  Weight (lbs) 139 lb 12.4 oz 134 lb 140 lb 12.8 oz  Weight (kg) 63.4 kg 60.782 kg 63.866 kg      Telemetry    NSR - Personally Reviewed  ECG    No new tracings - Personally Reviewed  Physical Exam   GEN: No acute distress.   Neck: No JVD Cardiac: RRR, SEM with valvular click. No rubs or gallops  Respiratory: Clear to auscultation bilaterally. GI: Soft, nontender, non-distended  MS: 1+ bilateral pedal edema; No deformity.  Neuro:  Nonfocal  Psych: Normal affect   Labs    High Sensitivity Troponin:   Recent Labs  Lab 11/04/19 0337 11/04/19 0453  TROPONINIHS 92* 116*       Chemistry Recent Labs  Lab 11/03/19 1539 11/03/19 1539 11/04/19 0337 11/05/19 0420 11/06/19 0335  NA 133*   < > 136 140 137  K 3.9   < > 3.8 3.7 4.2  CL 103   < > 107 109 103  CO2 22   < > 23 22 20*  GLUCOSE 136*   < > 92 97 108*  BUN 49*   < > 45* 36* 28*  CREATININE 2.00*   < > 1.93* 1.64* 1.45*  CALCIUM 8.2*   < > 8.1* 8.2* 8.5*  PROT 5.3*  --   --   --   --   ALBUMIN 3.2*  --   --   --   --   AST 30  --   --   --   --   ALT 30  --   --   --   --   ALKPHOS 32*  --   --   --   --   BILITOT 0.8  --   --   --   --   GFRNONAA 22*   < > 23* 28* 33*  GFRAA 26*   < >  27* 33* 38*  ANIONGAP 8   < > _0 < > = values in this interval not displayed.     Hematology Recent Labs  Lab 11/03/19 1539 11/04/19 0337 11/04/19 1907 11/05/19 0420 11/06/19 0335  WBC 7.5  --   --  5.6 5.9  RBC 1.80*  --   --  2.50* 2.72*  HGB 5.6*   < > 8.7* 7.8* 8.6*  HCT 19.6*   < > 26.8* 24.8* 27.9*  MCV 108.9*  --   --  99.2 102.6*  MCH 31.1  --   --  31.2 31.6  MCHC 28.6*  --   --  31.5 30.8  RDW 22.8*  --   --  22.3* 21.5*  PLT 206  --   --  148* 178   < > = values in this interval not displayed.    BNPNo results for input(s): BNP, PROBNP in the last 168 hours.   DDimer No results for input(s): DDIMER in the last 168 hours.   Radiology    No results found.  Cardiac Studies   ECHO 11/30/2018 1. The left ventricle has normal systolic function with an ejection  fraction of 60-65%. The cavity size was normal. There is moderately  increased left ventricular wall thickness. Left ventricular diastolic  function could not be evaluated secondary to  atrial fibrillation. No evidence of left ventricular regional wall motion  abnormalities.  2. Left atrial size was mildly dilated.  3. The mitral valve is degenerative. Moderate thickening of the mitral  valve leaflet. Moderate calcification of the mitral valve leaflet. There  is moderate mitral annular calcification present. A  cannot exclude  vegetation vegetation is seen on the  mitral leaflet.  4. Aortic valve regurgitation is trivial by color flow Doppler. mild  stenosis of the aortic valve.  5. The ascending aorta and aortic root are normal in size and structure.  6. When compared to the prior study: 7/9/201: LVEF 60-65%, normal  mechanical AOV prosthesis, mean gradient 28 mmHg, MAC with mild MR.  7. Cannot exclude small ASD.  Patient Profile     86 y.o. female with a hx of CAD, aortic valve disease s/p CABG and mechanical AVR on chronic Coumadin and aspirin, afib, HFpEF, HTN, HLD, CKD stage III, type II DMpresenting with symptomatic anemia due to GI bleeding.  Assessment & Plan    Symptomatic anemia 2/2 GI bleed in the setting of supratherapeutic INR Chronic Coumadin s/p mechanical aortic valve -s/p 2 units pRBCs on admission; hgb has remained stable since -underwent small bowel enteroscopy which revealed non-bleeding gastric ulcer and angioectasia which was treated with APC -coumadin has been resumed; she will remain on heparin gtt until INR is >2   Paroxysmal afib -currently in NSR -continue amiodarone, Lopressor  -warfarin resumed  CAD s/p CABG, multiple stents -continue lipitor, zetia -aspirin on hold for 4 weeks    For questions or updates, please contact [facility] Please consult [email or web address] for contact info under        Signed, Delice Bison, DO  11/07/2019, 8:19 AM    I have examined the patient and reviewed assessment and plan and discussed with patient.  Agree with above as stated.  Once INR is trending up and at 1.8 or grater, she should be ok for discharge.  Will need f/u CBC.  Coumadin restarted.  IV heaprin for now.  Crisp S2 click on exam.  Larae Grooms

## 2019-11-08 LAB — CBC
HCT: 23.6 % — ABNORMAL LOW (ref 36.0–46.0)
Hemoglobin: 7.3 g/dL — ABNORMAL LOW (ref 12.0–15.0)
MCH: 30.8 pg (ref 26.0–34.0)
MCHC: 30.9 g/dL (ref 30.0–36.0)
MCV: 99.6 fL (ref 80.0–100.0)
Platelets: 160 10*3/uL (ref 150–400)
RBC: 2.37 MIL/uL — ABNORMAL LOW (ref 3.87–5.11)
RDW: 19.5 % — ABNORMAL HIGH (ref 11.5–15.5)
WBC: 4.2 10*3/uL (ref 4.0–10.5)
nRBC: 0 % (ref 0.0–0.2)

## 2019-11-08 LAB — BASIC METABOLIC PANEL
Anion gap: 10 (ref 5–15)
BUN: 40 mg/dL — ABNORMAL HIGH (ref 8–23)
CO2: 22 mmol/L (ref 22–32)
Calcium: 8.2 mg/dL — ABNORMAL LOW (ref 8.9–10.3)
Chloride: 106 mmol/L (ref 98–111)
Creatinine, Ser: 1.82 mg/dL — ABNORMAL HIGH (ref 0.44–1.00)
GFR calc Af Amer: 29 mL/min — ABNORMAL LOW (ref >60)
GFR calc non Af Amer: 25 mL/min — ABNORMAL LOW (ref >60)
Glucose, Bld: 113 mg/dL — ABNORMAL HIGH (ref 70–99)
Potassium: 3.6 mmol/L (ref 3.5–5.1)
Sodium: 138 mmol/L (ref 135–145)

## 2019-11-08 LAB — HEPARIN LEVEL (UNFRACTIONATED)
Heparin Unfractionated: <0.1 IU/mL — ABNORMAL LOW (ref 0.30–0.70)
Heparin Unfractionated: 0.62 IU/mL (ref 0.30–0.70)
Heparin Unfractionated: 0.73 IU/mL — ABNORMAL HIGH (ref 0.30–0.70)

## 2019-11-08 LAB — GLUCOSE, CAPILLARY
Glucose-Capillary: 118 mg/dL — ABNORMAL HIGH (ref 70–99)
Glucose-Capillary: 118 mg/dL — ABNORMAL HIGH (ref 70–99)
Glucose-Capillary: 154 mg/dL — ABNORMAL HIGH (ref 70–99)
Glucose-Capillary: 154 mg/dL — ABNORMAL HIGH (ref 70–99)
Glucose-Capillary: 159 mg/dL — ABNORMAL HIGH (ref 70–99)
Glucose-Capillary: 218 mg/dL — ABNORMAL HIGH (ref 70–99)
Glucose-Capillary: 86 mg/dL (ref 70–99)

## 2019-11-08 LAB — SURGICAL PATHOLOGY

## 2019-11-08 LAB — PROTIME-INR
INR: 1.4 — ABNORMAL HIGH (ref 0.8–1.2)
Prothrombin Time: 17.3 seconds — ABNORMAL HIGH (ref 11.4–15.2)

## 2019-11-08 MED ORDER — ALPRAZOLAM 0.5 MG PO TABS
0.5000 mg | ORAL_TABLET | Freq: Two times a day (BID) | ORAL | Status: DC | PRN
  Administered 2019-11-08: 0.5 mg via ORAL
  Filled 2019-11-08: qty 1

## 2019-11-08 MED ORDER — ACETAMINOPHEN 325 MG PO TABS
650.0000 mg | ORAL_TABLET | Freq: Four times a day (QID) | ORAL | Status: DC | PRN
  Administered 2019-11-08: 650 mg via ORAL
  Filled 2019-11-08: qty 2

## 2019-11-08 MED ORDER — PANTOPRAZOLE SODIUM 40 MG PO TBEC
40.0000 mg | DELAYED_RELEASE_TABLET | Freq: Two times a day (BID) | ORAL | Status: DC
  Administered 2019-11-08 – 2019-11-09 (×2): 40 mg via ORAL
  Filled 2019-11-08 (×2): qty 1

## 2019-11-08 MED ORDER — WARFARIN SODIUM 7.5 MG PO TABS
7.5000 mg | ORAL_TABLET | Freq: Once | ORAL | Status: AC
  Administered 2019-11-08: 7.5 mg via ORAL
  Filled 2019-11-08: qty 1

## 2019-11-08 NOTE — Evaluation (Signed)
Patient complaining of increased difficulty sleeping with increased length of stay. Patient expresses that Ambien has worked for her on past admissions without adverse effects. On-call physician notified via [place] paging system.

## 2019-11-08 NOTE — Progress Notes (Signed)
[facility] for Heparin  Indication: mechanical AVR  Allergies  Allergen Reactions  . Darvon [Propoxyphene] Nausea And Vomiting  . Lisinopril Cough  . Septra [Sulfamethoxazole-Trimethoprim] Other (See Comments)    Increased INR  . Warfarin And Related Other (See Comments)    ONLY TOLERATES BRAND  . Penicillins Rash    Did it involve swelling of the face/tongue/throat, SOB, or low BP? No Did it involve sudden or severe rash/hives, skin peeling, or any reaction on the inside of your mouth or nose? No Did you need to seek medical attention at a hospital or doctor's office? No When did it last happen?15 years If all above answers are "NO", may proceed with cephalosporin use.    Patient Measurements: Height: 5' (152.4 cm) Weight: 139 lb 12.4 oz (63.4 kg) IBW/kg (Calculated) : 45.5   Vital Signs: Temp: 97.5 F (36.4 C) (02/15 2356) Temp Source: Oral (02/15 2051) BP: 118/62 (02/15 2356) Pulse Rate: 91 (02/15 2356)  Labs: Recent Labs    11/06/19 0335 11/06/19 2345 11/07/19 0744 11/07/19 1838 11/08/19 0530  HGB 8.6*  --  8.5*  --  7.3*  HCT 27.9*  --  27.4*  --  23.6*  PLT 178  --  211  --  160  LABPROT 17.8*  --  16.8*  --  17.3*  INR 1.5*  --  1.4*  --  1.4*  HEPARINUNFRC  --    < > 0.91* <0.10* <0.10*  CREATININE 1.45*  --   --   --   --    < > = values in this interval not displayed.    Estimated Creatinine Clearance: 24 mL/min (A) (by C-G formula based on SCr of 1.45 mg/dL (H)).   Medical History: Past Medical History:  Diagnosis Date  . Anemia   . Anginal pain ([place])   . Arteriovenous malformation of gastrointestinal tract   . Arthritis    "fingers mostly" (04/12/2015)  . Carotid artery disease ([place])    Carotid US 3/17:  38-88% RICA; 28-00% LICA; Elevated bilateral subclavian artery velocities >>f/u 1 year. // Carotid US 4/18: R 40-59; L 1-39 >> FU 1 year // Carotid US 10/2018: R 40-59; L 1-39, L subclavian stenosis    . CHF (congestive heart failure) ([place])   . Chronic kidney disease (CKD), stage III (moderate)   . Chronic lower GI bleeding    "today; last time was ~ 8 yr ago; used to have them often before that too" (02/11/2013)  . Coronary artery disease   . DJD (degenerative joint disease)   . Dysrhythmia   . Heart murmur   . History of blood transfusion    "a few times over the years; usually related to my Coumadin" (04/12/2015  . History of gout   . Hyperlipemia   . Hypertension   . Macula lutea degeneration   . Old MI (myocardial infarction)    "a coulple /dr in 02/2008; I never even knew I'd had them" (04/12/2015)  . Type II diabetes mellitus (H[facility])     Medications:  Medications Prior to Admission  Medication Sig Dispense Refill Last Dose  . acetaminophen (TYLENOL) 500 MG tablet Take 500 mg by mouth every 6 (six) hours as needed for mild pain or headache.   unk at [employer]  . amiodarone (PACERONE) 200 MG tablet Take 1 tablet (200 mg total) by mouth daily. 90 tablet 3 11/03/2019 at am  . aspirin EC 81 MG tablet Take 1 tablet (81  mg total) by mouth daily.   11/03/2019 at 0930  . atorvastatin (LIPITOR) 80 MG tablet Take 1 tablet by mouth once daily (Patient taking differently: Take 80 mg by mouth at bedtime. ) 90 tablet 3 11/02/2019 at pm  . bevacizumab (AVASTIN) 1.25 mg/0.1 mL SOLN Place 1.25 mg into the left eye every 8 (eight) weeks.   Past Week at Unknown time  . digoxin (LANOXIN) 0.125 MG tablet Take 0.125 mg by mouth daily.   11/03/2019 at am  . ezetimibe (ZETIA) 10 MG tablet TAKE 1 TABLET BY MOUTH ONCE DAILY PLEASE  MAKE  OVERDUE  APPOINTMENT  WITH  DR  Irish Lack  BEFORE  ANYMORE  REFILLS (Patient taking differently: Take 10 mg by mouth at bedtime. ) 90 tablet 3 11/02/2019 at pm  . FeFum-FePo-FA-B Cmp-C-Zn-Mn-Cu (SE-TAN PLUS) 162-115.2-1 MG CAPS Take 1 capsule by mouth 2 (two) times daily.   3 11/03/2019 at Unknown time  . folic acid (FOLVITE) 099 MCG tablet Take 400 mcg by mouth every evening.     11/02/2019 at pm  . furosemide (LASIX) 20 MG tablet Take 1 tablet (20 mg total) by mouth daily. You may take an extra 20 mg as needed for 2-3 lb weight gain overnight (Patient taking differently: Take 20 mg by mouth See admin instructions. Take 20 mg by mouth in the morning and and additional 20 mg as needed for an overnight weight gain of 2-3 pounds) 90 tablet 3 11/03/2019 at am  . isosorbide mononitrate (IMDUR) 60 MG 24 hr tablet Take 1 tablet (60 mg total) by mouth daily. 90 tablet 3 11/03/2019 at am  . JANTOVEN 5 MG tablet TAKE 1/2 TO 1 (ONE-HALF TO ONE) TABLET BY MOUTH ONCE DAILY AS  DIRECTED  BY  COUMADIN  CLINIC (Patient taking differently: Take 2.5-5 mg by mouth See admin instructions. Take 2.5 mg by mouth at bedtime on Sun/Wed/Fri and 5 mg on Mon/Tues/Thurs/Sat) 90 tablet 0 11/02/2019 at 2100  . JANUVIA 50 MG tablet Take 50 mg by mouth daily.    11/03/2019 at am  . losartan (COZAAR) 50 MG tablet Take 50 mg by mouth daily.   11/03/2019 at am  . metoprolol tartrate (LOPRESSOR) 25 MG tablet Take 1 tablet (25 mg total) by mouth daily. (Patient taking differently: Take 25 mg by mouth 2 (two) times daily. ) 90 tablet 3 11/03/2019 at Unknown time  . Multiple Vitamin (MULTIVITAMIN WITH MINERALS) TABS Take 1 tablet by mouth daily.   11/03/2019 at Unknown time  . Multiple Vitamins-Minerals (PRESERVISION AREDS 2 PO) Take 1 tablet by mouth 2 (two) times daily.    11/03/2019 at am  . nitroGLYCERIN (NITROSTAT) 0.4 MG SL tablet Place 1 tablet (0.4 mg total) under the tongue every 5 (five) minutes as needed for chest pain. Please make appt for January. 1st attempt (Patient taking differently: Place 0.4 mg under the tongue every 5 (five) minutes as needed for chest pain. ) 25 tablet 0 unk at unk  . Omega-3 Fatty Acids (FISH OIL PO) Take 1 capsule by mouth in the morning and at bedtime.   11/03/2019 at am  . potassium chloride (KLOR-CON) 10 MEQ tablet Take 10 mEq by mouth 2 (two) times daily.   11/03/2019 at am     Assessment: 86 yo female with GIB. She in on warfarin PTA and this is on hold with plans for enteroscopy when INR < 2.0. Pharmacy consulted to start heparin when INR < 2.0. Likely enteroscopy today -INR= 1.5, hg= 8.6  2/15 AM update:  Heparin level now sub-therapeutic  No issues per RN Warfarin re-started, INR 1.4  Goal of Therapy:  Heparin level 0.3-0.5 units/mL Monitor platelets by anticoagulation protocol: Yes   Plan:  -Inc heparin to 800 units/hr -Re-check heparin level at [place], PharmD, [place] Pharmacist Phone: [phone]

## 2019-11-08 NOTE — Plan of Care (Signed)

## 2019-11-08 NOTE — Progress Notes (Signed)
PROGRESS NOTE    [name]  OZH:[id number] DOB: [birth year] DOA: 11/03/2019 PCP: Gaynelle Arabian, MD     Brief Narrative:  [name] is an 86 year old Caucasian female with past medical history significant for atrial fibrillation, status post mechanical aortic valve replacement, chronic kidney disease stage III, diastolic congestive heart failure with last echocardiogram revealed an ejection fraction of greater than 65%, chronic lower GI bleed, AV malformation, MI, coronary artery disease, diabetes mellitus, hypertension and hyperlipidemia.  Patient is on Coumadin for atrial fibrillation and prostatic aortic heart valve.  On 11/01/2019, INR was 4.7.  Patient's Coumadin has been on hold.  Patient reported significant dark blood per rectum 5 days ago, and has had intermittent rectal bleed since then, though not as significant. On presentation to the hospital, patient's hemoglobin was 5.6 g/dL (baseline of 10.9 g/dL).  Patient admitted for GI bleed and blood transfusion.  GI consulted. Patient underwent small bowel enteroscopy 2/14 and resumed on coumadin/heparin.   New events last 24 hours / Subjective: No complaints, no bowel movements, ambulating around the room, feeling well overall and wondering when she can go home.  Assessment & Plan:   Active Problems:   H/O mechanical aortic valve replacement   Symptomatic anemia   Acute GI bleeding   GI bleed with blood loss anemia -Patient with history of small bowel AVM -FOBT positive -Patient transfused 2 unit packed red blood cell on 2/11 -S/p small bowel enteroscopy 2/14:    Non-bleeding gastric ulcer with no stigmata of bleeding  A single non-bleeding angioectasia in the jejunum. Treated with argon plasma coagulation (APC).  Use Protonix (pantoprazole) 40 mg PO BID x 6 weeks, then once daily indefinitely   No aspirin for 4 weeks, then resume at 81 mg daily.  Upon discharge, once daily slow release iron tablet for 6 weeks.  -Folate 45.4, vitamin B12 1072 -Currently on IV heparin, Coumadin -Hgb trended downward 7.3 this morning   Paroxysmal atrial fibrillation -Continue amiodarone, digoxin (on hold due to renal function) -Resume coumadin. Goal INR has been 2-2.5 as outpatient.  -Cardiology following  Status post prosthetic aortic valve replacement -Resume coumadin. Goal INR has been 2-2.5 as outpatient.  -Cardiology following   Type 2 diabetes -Sliding scale insulin  AKI on CKD stage IIIb-IV -Baseline creatinine 1.5 -Hold Cozaar  Hypertension -Continue Lopressor  CAD -Continue Imdur, hold aspirin  Hyperlipidemia -Continue Lipitor, Zetia  Chronic diastolic HF  -Continue home lasix     DVT prophylaxis: SCD, IV heparin, Coumadin Code Status: Full code Family Communication: No family at bedside Disposition Plan:  . Patient is from home prior to admission. . Currently in-hospital treatment needed due IV heparin bridging with Coumadin . Suspect patient will discharge home 2/17 as long as INR >1.8 and hemoglobin, creatinine remain stable   Consultants:   GI  Cardiology  Procedures:   Small bowel enteroscopy 2/14   Antimicrobials:  Anti-infectives (From admission, onward)   None       Objective: Vitals:   11/07/19 1732 11/07/19 2051 11/07/19 2356 11/08/19 0831  BP: 118/69 (!) 113/48 118/62 108/66  Pulse: 87 86 91 82  Resp: 19 16    Temp: 98 F (36.7 C) 98.1 F (36.7 C) (!) 97.5 F (36.4 C) 98.4 F (36.9 C)  TempSrc:  Oral    SpO2: 98% 100% 96% 100%  Weight:      Height:        Intake/Output Summary (Last 24 hours) at 11/08/2019 1000 Last data filed  at 11/08/2019 0447 Gross per 24 hour  Intake 104.96 ml  Output -  Net 104.96 ml   Filed Weights   11/03/19 1819 11/03/19 2012  Weight: 60.8 kg 63.4 kg    Examination: General exam: Appears calm and comfortable  Respiratory system: Clear to auscultation. Respiratory effort normal. Cardiovascular system: S1 & S2  heard, RRR. + Valvular click Gastrointestinal system: Abdomen is nondistended, soft and nontender. Normal bowel sounds heard. Central nervous system: Alert and oriented. Non focal exam. Speech clear  Extremities: Symmetric in appearance bilaterally  Skin: No rashes, lesions or ulcers on exposed skin  Psychiatry: Judgement and insight appear stable. Mood & affect appropriate.    Data Reviewed: I have personally reviewed following labs and imaging studies  CBC: Recent Labs  Lab 11/03/19 1539 11/04/19 0337 11/04/19 1907 11/05/19 0420 11/06/19 0335 11/07/19 0744 11/08/19 0530  WBC 7.5  --   --  5.6 5.9 6.5 4.2  HGB 5.6*   < > 8.7* 7.8* 8.6* 8.5* 7.3*  HCT 19.6*   < > 26.8* 24.8* 27.9* 27.4* 23.6*  MCV 108.9*  --   --  99.2 102.6* 102.2* 99.6  PLT 206  --   --  148* 178 211 160   < > = values in this interval not displayed.   Basic Metabolic Panel: Recent Labs  Lab 11/03/19 1539 11/03/19 1915 11/04/19 0337 11/05/19 0420 11/06/19 0335 11/08/19 0530  NA 133*  --  136 140 137 138  K 3.9  --  3.8 3.7 4.2 3.6  CL 103  --  107 109 103 106  CO2 22  --  23 22 20* 22  GLUCOSE 136*  --  92 97 108* 113*  BUN 49*  --  45* 36* 28* 40*  CREATININE 2.00*  --  1.93* 1.64* 1.45* 1.82*  CALCIUM 8.2*  --  8.1* 8.2* 8.5* 8.2*  MG  --  2.1  --   --   --   --   PHOS  --  3.8  --   --   --   --    GFR: Estimated Creatinine Clearance: 19.1 mL/min (A) (by C-G formula based on SCr of 1.82 mg/dL (H)). Liver Function Tests: Recent Labs  Lab 11/03/19 1539  AST 30  ALT 30  ALKPHOS 32*  BILITOT 0.8  PROT 5.3*  ALBUMIN 3.2*   No results for input(s): LIPASE, AMYLASE in the last 168 hours. No results for input(s): AMMONIA in the last 168 hours. Coagulation Profile: Recent Labs  Lab 11/04/19 1907 11/05/19 0420 11/06/19 0335 11/07/19 0744 11/08/19 0530  INR 2.2* 2.3* 1.5* 1.4* 1.4*   Cardiac Enzymes: No results for input(s): CKTOTAL, CKMB, CKMBINDEX, TROPONINI in the last 168 hours.  BNP (last 3 results) No results for input(s): PROBNP in the last 8760 hours. HbA1C: No results for input(s): HGBA1C in the last 72 hours. CBG: Recent Labs  Lab 11/07/19 1643 11/07/19 1942 11/07/19 2357 11/08/19 0326 11/08/19 0830  GLUCAP 122* 110* 118* 118* 218*   Lipid Profile: No results for input(s): CHOL, HDL, LDLCALC, TRIG, CHOLHDL, LDLDIRECT in the last 72 hours. Thyroid Function Tests: No results for input(s): TSH, T4TOTAL, FREET4, T3FREE, THYROIDAB in the last 72 hours. Anemia Panel: Recent Labs    11/07/19 0744  VITAMINB12 1,072*  FOLATE 45.4   Sepsis Labs: No results for input(s): PROCALCITON, LATICACIDVEN in the last 168 hours.  Recent Results (from the past 240 hour(s))  SARS CORONAVIRUS 2 (TAT 6-24 HRS) Nasopharyngeal Nasopharyngeal Swab  Status: None   Collection Time: 11/03/19  5:05 PM   Specimen: Nasopharyngeal Swab  Result Value Ref Range Status   SARS Coronavirus 2 NEGATIVE NEGATIVE Final    Comment: (NOTE) SARS-CoV-2 target nucleic acids are NOT DETECTED. The SARS-CoV-2 RNA is generally detectable in upper and lower respiratory specimens during the acute phase of infection. Negative results do not preclude SARS-CoV-2 infection, do not rule out co-infections with other pathogens, and should not be used as the sole basis for treatment or other patient management decisions. Negative results must be combined with clinical observations, patient history, and epidemiological information. The expected result is Negative. Fact Sheet for Patients: SugarRoll.be Fact Sheet for Healthcare Providers: https://www.woods-mathews.com/ This test is not yet approved or cleared by the [place] FDA and  has been authorized for detection and/or diagnosis of SARS-CoV-2 by FDA under an Emergency Use Authorization (EUA). This EUA will remain  in effect (meaning this test can be used) for the duration of the COVID-19  declaration under Section 56 4(b)(1) of the Act, 21 U.S.C. section 360bbb-3(b)(1), unless the authorization is terminated or revoked sooner. Performed at [facility], [place] [address] [place], [place] [address]   MRSA PCR Screening     Status: None   Collection Time: 11/03/19  8:11 PM   Specimen: Nasopharyngeal  Result Value Ref Range Status   MRSA by PCR NEGATIVE NEGATIVE Final    Comment:        The GeneXpert MRSA Assay (FDA approved for NASAL specimens only), is one component of a comprehensive MRSA colonization surveillance program. It is not intended to diagnose MRSA infection nor to guide or monitor treatment for MRSA infections. Performed at [facility], [place] [address], [place], [place] [address]       Radiology Studies: No results found.    Scheduled Meds: . amiodarone  200 mg Oral Daily  . atorvastatin  80 mg Oral Daily  . ezetimibe  10 mg Oral QHS  . furosemide  20 mg Oral Daily  . insulin aspart  0-9 Units Subcutaneous TID WC  . isosorbide mononitrate  120 mg Oral Daily  . metoprolol tartrate  25 mg Oral Daily  . Warfarin - Pharmacist Dosing Inpatient   Does not apply q1800   Continuous Infusions: . sodium chloride    . heparin 800 Units/hr (11/08/19 9201)  . sodium chloride       LOS: 4 days      Time spent: 25 minutes   Dessa Phi, DO [facility] 11/08/2019, 10:00 AM   Available via Epic secure chat 7am-7pm After these hours, please refer to coverage provider listed on [email or web address]

## 2019-11-08 NOTE — Progress Notes (Addendum)
Progress Note  Patient Name: [name] Date of Encounter: 11/08/2019  Primary Cardiologist: Larae Grooms, MD   Subjective   Feeling well this morning. No further bleeding. Noted some improvement in swelling with IV lasix. No light headedness, palpitations, shortness of breath, or chest pain.   Inpatient Medications    Scheduled Meds: . amiodarone  200 mg Oral Daily  . atorvastatin  80 mg Oral Daily  . ezetimibe  10 mg Oral QHS  . furosemide  20 mg Oral Daily  . insulin aspart  0-9 Units Subcutaneous TID WC  . isosorbide mononitrate  120 mg Oral Daily  . metoprolol tartrate  25 mg Oral Daily  . Warfarin - Pharmacist Dosing Inpatient   Does not apply q1800   Continuous Infusions: . sodium chloride    . heparin 800 Units/hr (11/08/19 4174)  . sodium chloride     PRN Meds: docusate sodium, simethicone   Vital Signs    Vitals:   11/07/19 0808 11/07/19 1732 11/07/19 2051 11/07/19 2356  BP: (!) 142/52 118/69 (!) 113/48 118/62  Pulse: 87 87 86 91  Resp: 20 19 16    Temp: (!) 97.4 F (36.3 C) 98 F (36.7 C) 98.1 F (36.7 C) (!) 97.5 F (36.4 C)  TempSrc:   Oral   SpO2: 97% 98% 100% 96%  Weight:      Height:        Intake/Output Summary (Last 24 hours) at 11/08/2019 0800 Last data filed at 11/08/2019 0447 Gross per 24 hour  Intake 104.96 ml  Output --  Net 104.96 ml   Last 3 Weights 11/03/2019 11/03/2019 09/06/2019  Weight (lbs) 139 lb 12.4 oz 134 lb 140 lb 12.8 oz  Weight (kg) 63.4 kg 60.782 kg 63.866 kg      Telemetry    NSR - Personally Reviewed  ECG    No new tracings - Personally Reviewed  Physical Exam   GEN: No acute distress.   Neck: No JVD Cardiac: RRR, SEM with S2 click, no rubs or gallops.  Respiratory: Clear to auscultation bilaterally. GI: Soft, nontender, non-distended  MS: 2+ pedal edema extending to mid-shin; No deformity. Neuro:  Nonfocal  Psych: Normal affect   Labs    High Sensitivity Troponin:   Recent Labs  Lab  11/04/19 0337 11/04/19 0453  TROPONINIHS 92* 116*      Chemistry Recent Labs  Lab 11/03/19 1539 11/03/19 1539 11/04/19 0337 11/05/19 0420 11/06/19 0335  NA 133*   < > 136 140 137  K 3.9   < > 3.8 3.7 4.2  CL 103   < > 107 109 103  CO2 22   < > 23 22 20*  GLUCOSE 136*   < > 92 97 108*  BUN 49*   < > 45* 36* 28*  CREATININE 2.00*   < > 1.93* 1.64* 1.45*  CALCIUM 8.2*   < > 8.1* 8.2* 8.5*  PROT 5.3*  --   --   --   --   ALBUMIN 3.2*  --   --   --   --   AST 30  --   --   --   --   ALT 30  --   --   --   --   ALKPHOS 32*  --   --   --   --   BILITOT 0.8  --   --   --   --   GFRNONAA 22*   < > 23*  28* 33*  GFRAA 26*   < > 27* 33* 38*  ANIONGAP 8   < > 6 9 14    < > = values in this interval not displayed.     Hematology Recent Labs  Lab 11/06/19 0335 11/07/19 0744 11/08/19 0530  WBC 5.9 6.5 4.2  RBC 2.72* 2.68* 2.37*  HGB 8.6* 8.5* 7.3*  HCT 27.9* 27.4* 23.6*  MCV 102.6* 102.2* 99.6  MCH 31.6 31.7 30.8  MCHC 30.8 31.0 30.9  RDW 21.5* 20.6* 19.5*  PLT 178 211 160    BNPNo results for input(s): BNP, PROBNP in the last 168 hours.   DDimer No results for input(s): DDIMER in the last 168 hours.   Radiology    No results found.  Cardiac Studies    Patient Profile     86 y.o.femalewith a hx of CAD, aortic valve disease s/p CABG and mechanical AVR on chronic Coumadin and aspirin, afib, HFpEF, HTN, HLD, CKD stage III, type II DMpresenting with symptomatic anemia due to GI bleeding.  Assessment & Plan    Symptomatic anemia 2/2 GI bleedin the setting of supratherapeutic INR Chronic Coumadins/pmechanical aortic valve -underwent small bowel enteroscopy 2/14 which revealed non-bleeding gastric ulcer and angioectasia which was treated with APC -hgb trended down from 8.5>7.3 -coumadin resumed; currently on heparin gtt until INR is at goal. Remains unchanged at 1.4 today. Would like to see INR reach 1.8 and then will be stable for discharge with close outpatient  follow-up    Paroxysmal afib -currently in NSR -continue amiodarone, Lopressor -warfarin resumed  CAD s/p CABG, multiple stents -continue lipitor, zetia -aspirin on hold for 4 weeks  Chronic HFpEF -given dose of IV lasix yesterday in addition to her home lasix with symptomatic improvement     For questions or updates, please contact [facility] Please consult [email or web address] for contact info under        Signed, Delice Bison, DO  11/08/2019, 8:00 AM    I have examined the patient and reviewed assessment and plan and discussed with patient.  Agree with above as stated.    Hemoglobin is decreased today.  Continue to monitor.  She received IV Lasix yesterday.  There was some improvement in her lower extremity edema.  Creatinine increased slightly.  Will hold Lasix today.  If creatinine better, could give another dose of IV Lasix tomorrow.  Plan is to hold aspirin.   Larae Grooms

## 2019-11-08 NOTE — Progress Notes (Signed)
[facility] for Heparin  Indication: mechanical AVR  Allergies  Allergen Reactions  . Darvon [Propoxyphene] Nausea And Vomiting  . Lisinopril Cough  . Septra [Sulfamethoxazole-Trimethoprim] Other (See Comments)    Increased INR  . Warfarin And Related Other (See Comments)    ONLY TOLERATES BRAND  . Penicillins Rash    Did it involve swelling of the face/tongue/throat, SOB, or low BP? No Did it involve sudden or severe rash/hives, skin peeling, or any reaction on the inside of your mouth or nose? No Did you need to seek medical attention at a hospital or doctor's office? No When did it last happen?15 years If all above answers are "NO", may proceed with cephalosporin use.    Patient Measurements: Height: 5' (152.4 cm) Weight: 139 lb 12.4 oz (63.4 kg) IBW/kg (Calculated) : 45.5   Vital Signs: Temp: 97.9 F (36.6 C) (02/16 1958) Temp Source: Oral (02/16 1958) BP: 95/42 (02/16 1958) Pulse Rate: 74 (02/16 1958)  Labs: Recent Labs    11/06/19 0335 11/06/19 2345 11/07/19 0744 11/07/19 1838 11/08/19 0530 11/08/19 1442 11/08/19 2229  HGB 8.6*  --  8.5*  --  7.3*  --   --   HCT 27.9*  --  27.4*  --  23.6*  --   --   PLT 178  --  211  --  160  --   --   LABPROT 17.8*  --  16.8*  --  17.3*  --   --   INR 1.5*  --  1.4*  --  1.4*  --   --   HEPARINUNFRC  --    < > 0.91*   < > <0.10* 0.62 0.73*  CREATININE 1.45*  --   --   --  1.82*  --   --    < > = values in this interval not displayed.    Estimated Creatinine Clearance: 19.1 mL/min (A) (by C-G formula based on SCr of 1.82 mg/dL (H)).   Medical History: Past Medical History:  Diagnosis Date  . Anemia   . Anginal pain ([place])   . Arteriovenous malformation of gastrointestinal tract   . Arthritis    "fingers mostly" (04/12/2015)  . Carotid artery disease ([place])    Carotid US 3/17:  36-14% RICA; 43-15% LICA; Elevated bilateral subclavian artery velocities >>f/u 1 year. // Carotid  US 4/18: R 40-59; L 1-39 >> FU 1 year // Carotid US 10/2018: R 40-59; L 1-39, L subclavian stenosis   . CHF (congestive heart failure) ([place])   . Chronic kidney disease (CKD), stage III (moderate)   . Chronic lower GI bleeding    "today; last time was ~ 8 yr ago; used to have them often before that too" (02/11/2013)  . Coronary artery disease   . DJD (degenerative joint disease)   . Dysrhythmia   . Heart murmur   . History of blood transfusion    "a few times over the years; usually related to my Coumadin" (04/12/2015  . History of gout   . Hyperlipemia   . Hypertension   . Macula lutea degeneration   . Old MI (myocardial infarction)    "a coulple /dr in 02/2008; I never even knew I'd had them" (04/12/2015)  . Type II diabetes mellitus (H[facility])     Medications:  Medications Prior to Admission  Medication Sig Dispense Refill Last Dose  . acetaminophen (TYLENOL) 500 MG tablet Take 500 mg by mouth every 6 (six) hours as needed  for mild pain or headache.   unk at [employer]  . amiodarone (PACERONE) 200 MG tablet Take 1 tablet (200 mg total) by mouth daily. 90 tablet 3 11/03/2019 at am  . aspirin EC 81 MG tablet Take 1 tablet (81 mg total) by mouth daily.   11/03/2019 at 0930  . atorvastatin (LIPITOR) 80 MG tablet Take 1 tablet by mouth once daily (Patient taking differently: Take 80 mg by mouth at bedtime. ) 90 tablet 3 11/02/2019 at pm  . bevacizumab (AVASTIN) 1.25 mg/0.1 mL SOLN Place 1.25 mg into the left eye every 8 (eight) weeks.   Past Week at Unknown time  . digoxin (LANOXIN) 0.125 MG tablet Take 0.125 mg by mouth daily.   11/03/2019 at am  . ezetimibe (ZETIA) 10 MG tablet TAKE 1 TABLET BY MOUTH ONCE DAILY PLEASE  MAKE  OVERDUE  APPOINTMENT  WITH  DR  Irish Lack  BEFORE  ANYMORE  REFILLS (Patient taking differently: Take 10 mg by mouth at bedtime. ) 90 tablet 3 11/02/2019 at pm  . FeFum-FePo-FA-B Cmp-C-Zn-Mn-Cu (SE-TAN PLUS) 162-115.2-1 MG CAPS Take 1 capsule by mouth 2 (two) times daily.   3 11/03/2019 at  Unknown time  . folic acid (FOLVITE) 956 MCG tablet Take 400 mcg by mouth every evening.    11/02/2019 at pm  . furosemide (LASIX) 20 MG tablet Take 1 tablet (20 mg total) by mouth daily. You may take an extra 20 mg as needed for 2-3 lb weight gain overnight (Patient taking differently: Take 20 mg by mouth See admin instructions. Take 20 mg by mouth in the morning and and additional 20 mg as needed for an overnight weight gain of 2-3 pounds) 90 tablet 3 11/03/2019 at am  . isosorbide mononitrate (IMDUR) 60 MG 24 hr tablet Take 1 tablet (60 mg total) by mouth daily. 90 tablet 3 11/03/2019 at am  . JANTOVEN 5 MG tablet TAKE 1/2 TO 1 (ONE-HALF TO ONE) TABLET BY MOUTH ONCE DAILY AS  DIRECTED  BY  COUMADIN  CLINIC (Patient taking differently: Take 2.5-5 mg by mouth See admin instructions. Take 2.5 mg by mouth at bedtime on Sun/Wed/Fri and 5 mg on Mon/Tues/Thurs/Sat) 90 tablet 0 11/02/2019 at 2100  . JANUVIA 50 MG tablet Take 50 mg by mouth daily.    11/03/2019 at am  . losartan (COZAAR) 50 MG tablet Take 50 mg by mouth daily.   11/03/2019 at am  . metoprolol tartrate (LOPRESSOR) 25 MG tablet Take 1 tablet (25 mg total) by mouth daily. (Patient taking differently: Take 25 mg by mouth 2 (two) times daily. ) 90 tablet 3 11/03/2019 at Unknown time  . Multiple Vitamin (MULTIVITAMIN WITH MINERALS) TABS Take 1 tablet by mouth daily.   11/03/2019 at Unknown time  . Multiple Vitamins-Minerals (PRESERVISION AREDS 2 PO) Take 1 tablet by mouth 2 (two) times daily.    11/03/2019 at am  . nitroGLYCERIN (NITROSTAT) 0.4 MG SL tablet Place 1 tablet (0.4 mg total) under the tongue every 5 (five) minutes as needed for chest pain. Please make appt for January. 1st attempt (Patient taking differently: Place 0.4 mg under the tongue every 5 (five) minutes as needed for chest pain. ) 25 tablet 0 unk at unk  . Omega-3 Fatty Acids (FISH OIL PO) Take 1 capsule by mouth in the morning and at bedtime.   11/03/2019 at am  . potassium chloride  (KLOR-CON) 10 MEQ tablet Take 10 mEq by mouth 2 (two) times daily.   11/03/2019 at am  Assessment: 86 yo female with GIB. She in on warfarin PTA and this is on hold with plans for enteroscopy when INR < 2.0. Pharmacy consulted to start heparin when INR < 2.0. Likely enteroscopy today -INR= 1.5, hg= 8.6  2/16 PM update:  Heparin level elevated No issues per RN Warfarin re-started, INR 1.4  Goal of Therapy:  Heparin level 0.3-0.5 units/mL Monitor platelets by anticoagulation protocol: Yes   Plan:  -Dec heparin to 500 unitshr -Re-check heparin level in 8 hours  Narda Bonds, PharmD, [place] Pharmacist Phone: [phone]

## 2019-11-08 NOTE — Progress Notes (Signed)
[facility] for heparin  Indication: mechanical AVR  Allergies  Allergen Reactions  . Darvon [Propoxyphene] Nausea And Vomiting  . Lisinopril Cough  . Septra [Sulfamethoxazole-Trimethoprim] Other (See Comments)    Increased INR  . Warfarin And Related Other (See Comments)    ONLY TOLERATES BRAND  . Penicillins Rash    Did it involve swelling of the face/tongue/throat, SOB, or low BP? No Did it involve sudden or severe rash/hives, skin peeling, or any reaction on the inside of your mouth or nose? No Did you need to seek medical attention at a hospital or doctor's office? No When did it last happen?15 years If all above answers are "NO", may proceed with cephalosporin use.    Patient Measurements: Height: 5' (152.4 cm) Weight: 139 lb 12.4 oz (63.4 kg) IBW/kg (Calculated) : 45.5   Vital Signs: Temp: 97.8 F (36.6 C) (02/16 1537) BP: 122/47 (02/16 1537) Pulse Rate: 74 (02/16 1537)  Labs: Recent Labs    11/06/19 0335 11/06/19 2345 11/07/19 0744 11/07/19 0744 11/07/19 1838 11/08/19 0530 11/08/19 1442  HGB 8.6*  --  8.5*  --   --  7.3*  --   HCT 27.9*  --  27.4*  --   --  23.6*  --   PLT 178  --  211  --   --  160  --   LABPROT 17.8*  --  16.8*  --   --  17.3*  --   INR 1.5*  --  1.4*  --   --  1.4*  --   HEPARINUNFRC  --    < > 0.91*   < > <0.10* <0.10* 0.62  CREATININE 1.45*  --   --   --   --  1.82*  --    < > = values in this interval not displayed.    Estimated Creatinine Clearance: 19.1 mL/min (A) (by C-G formula based on SCr of 1.82 mg/dL (H)).  Assessment: 86 yo female with GIB. She in on warfarin PTA; s/p enteroscopy Warfarin restarted last night 2/15 and remains on heparin gtt.   Heparin level supratherapeutic this afternoon at 0.62 on heparin IV 800 units/hr. No bleeding or infusion issues per the RN. Hemoglobin dropped from 8.5 to 7.3 since yesterday. Platelets dropped from 211 to 160.   Goal of Therapy:   Heparin level 0.3-0.5 INR 2 - 2.5 Monitor platelets by anticoagulation protocol: Yes   Plan:  - Decrease heparin IV infusion to 650 units/hr - Warfarin 7.5 mg PO x1 - Recheck heparin level in ~8 hours - Check daily heparin level, CBC, INR - Monitor for s/sx of bleeding  Agnes Lawrence, PharmD PGY1 Pharmacy Resident

## 2019-11-08 NOTE — Progress Notes (Signed)
[place] for heparin, warfarin  Indication: mechanical AVR  Allergies  Allergen Reactions  . Darvon [Propoxyphene] Nausea And Vomiting  . Lisinopril Cough  . Septra [Sulfamethoxazole-Trimethoprim] Other (See Comments)    Increased INR  . Warfarin And Related Other (See Comments)    ONLY TOLERATES BRAND  . Penicillins Rash    Did it involve swelling of the face/tongue/throat, SOB, or low BP? No Did it involve sudden or severe rash/hives, skin peeling, or any reaction on the inside of your mouth or nose? No Did you need to seek medical attention at a hospital or doctor's office? No When did it last happen?15 years If all above answers are "NO", may proceed with cephalosporin use.    Patient Measurements: Height: 5' (152.4 cm) Weight: 139 lb 12.4 oz (63.4 kg) IBW/kg (Calculated) : 45.5   Vital Signs: Temp: 98.4 F (36.9 C) (02/16 0831) BP: 108/66 (02/16 0831) Pulse Rate: 82 (02/16 0831)  Labs: Recent Labs    11/06/19 0335 11/06/19 2345 11/07/19 0744 11/07/19 1838 11/08/19 0530  HGB 8.6*  --  8.5*  --  7.3*  HCT 27.9*  --  27.4*  --  23.6*  PLT 178  --  211  --  160  LABPROT 17.8*  --  16.8*  --  17.3*  INR 1.5*  --  1.4*  --  1.4*  HEPARINUNFRC  --    < > 0.91* <0.10* <0.10*  CREATININE 1.45*  --   --   --  1.82*   < > = values in this interval not displayed.    Estimated Creatinine Clearance: 19.1 mL/min (A) (by C-G formula based on SCr of 1.82 mg/dL (H)).  Assessment: 86 yo female with GIB. She in on warfarin PTA; sp enteroscopy  Restarted warfarin last night and remains on heparin gtt  Hep lvl low this am, INR 1.4  No bleeding per RN  CBC Stable  Goal of Therapy:  Heparin level 0.3-0.5 INR 2 - 2.5 Monitor platelets by anticoagulation protocol: Yes   Plan:  Continue heparin 800 units/hr  Warfarin 7.5 x 1 1500 HL Daily HL CBC INR  Barth Kirks, PharmD, BCPS, BCCCP Clinical Pharmacist [phone]  Please  check AMION for all [place] numbers  11/08/2019 11:06 AM

## 2019-11-09 LAB — CBC
HCT: 23.3 % — ABNORMAL LOW (ref 36.0–46.0)
Hemoglobin: 7.3 g/dL — ABNORMAL LOW (ref 12.0–15.0)
MCH: 31.6 pg (ref 26.0–34.0)
MCHC: 31.3 g/dL (ref 30.0–36.0)
MCV: 100.9 fL — ABNORMAL HIGH (ref 80.0–100.0)
Platelets: 140 10*3/uL — ABNORMAL LOW (ref 150–400)
RBC: 2.31 MIL/uL — ABNORMAL LOW (ref 3.87–5.11)
RDW: 19.2 % — ABNORMAL HIGH (ref 11.5–15.5)
WBC: 3.6 10*3/uL — ABNORMAL LOW (ref 4.0–10.5)
nRBC: 0.6 % — ABNORMAL HIGH (ref 0.0–0.2)

## 2019-11-09 LAB — BASIC METABOLIC PANEL
Anion gap: 12 (ref 5–15)
BUN: 36 mg/dL — ABNORMAL HIGH (ref 8–23)
CO2: 21 mmol/L — ABNORMAL LOW (ref 22–32)
Calcium: 8.2 mg/dL — ABNORMAL LOW (ref 8.9–10.3)
Chloride: 104 mmol/L (ref 98–111)
Creatinine, Ser: 1.64 mg/dL — ABNORMAL HIGH (ref 0.44–1.00)
GFR calc Af Amer: 33 mL/min — ABNORMAL LOW (ref >60)
GFR calc non Af Amer: 28 mL/min — ABNORMAL LOW (ref >60)
Glucose, Bld: 92 mg/dL (ref 70–99)
Potassium: 4.2 mmol/L (ref 3.5–5.1)
Sodium: 137 mmol/L (ref 135–145)

## 2019-11-09 LAB — PROTIME-INR
INR: 1.6 — ABNORMAL HIGH (ref 0.8–1.2)
Prothrombin Time: 18.5 seconds — ABNORMAL HIGH (ref 11.4–15.2)

## 2019-11-09 LAB — APTT: aPTT: 179 seconds (ref 24–36)

## 2019-11-09 LAB — HEPARIN LEVEL (UNFRACTIONATED): Heparin Unfractionated: 0.94 IU/mL — ABNORMAL HIGH (ref 0.30–0.70)

## 2019-11-09 LAB — GLUCOSE, CAPILLARY
Glucose-Capillary: 93 mg/dL (ref 70–99)
Glucose-Capillary: 86 mg/dL (ref 70–99)
Glucose-Capillary: 128 mg/dL — ABNORMAL HIGH (ref 70–99)

## 2019-11-09 MED ORDER — HEPARIN (PORCINE) 25000 UT/250ML-% IV SOLN
250.0000 [IU]/h | INTRAVENOUS | Status: DC
  Administered 2019-11-09: 250 [IU]/h via INTRAVENOUS
  Filled 2019-11-09: qty 250

## 2019-11-09 MED ORDER — FUROSEMIDE 10 MG/ML IJ SOLN
40.0000 mg | Freq: Once | INTRAMUSCULAR | Status: AC
  Administered 2019-11-09: 40 mg via INTRAVENOUS
  Filled 2019-11-09: qty 4

## 2019-11-09 MED ORDER — WARFARIN SODIUM 5 MG PO TABS: 5.0000 mg | ORAL_TABLET | Freq: Once | ORAL | Status: DC

## 2019-11-09 MED ORDER — PANTOPRAZOLE SODIUM 40 MG PO TBEC: 40.0000 mg | DELAYED_RELEASE_TABLET | Freq: Two times a day (BID) | ORAL | 0 refills | Status: DC

## 2019-11-09 NOTE — Progress Notes (Signed)
CRITICAL VALUE ALERT  Critical Value:  APTT = 179  Date & Time Notied:  11/09/19 @ 10:52  Provider Notified: Neysa Bonito MD  Orders Received/Actions taken: 11/09/19 @1100 . RN to continue to monitor pt. Pt's heparin gtt held from 0900 to 1000 d/t INR and labs like this.

## 2019-11-09 NOTE — Progress Notes (Addendum)
Progress Note  Patient Name: [name] Date of Encounter: 11/09/2019  Primary Cardiologist: Larae Grooms, MD   Subjective   Ms. Barbe denies any acute concerns today. She is very anxious to get out of the hospital. Starting to have difficult time sleeping and getting more anxious not being able to move around like she would at home. No light headedness when she walks around, chest pain. BMs are normal in consistency and color.   Inpatient Medications    Scheduled Meds: . amiodarone  200 mg Oral Daily  . atorvastatin  80 mg Oral Daily  . ezetimibe  10 mg Oral QHS  . insulin aspart  0-9 Units Subcutaneous TID WC  . isosorbide mononitrate  120 mg Oral Daily  . metoprolol tartrate  25 mg Oral Daily  . pantoprazole  40 mg Oral BID AC  . Warfarin - Pharmacist Dosing Inpatient   Does not apply q1800   Continuous Infusions: . sodium chloride    . heparin 500 Units/hr (11/09/19 0013)  . sodium chloride     PRN Meds: acetaminophen, ALPRAZolam, docusate sodium, simethicone   Vital Signs    Vitals:   11/07/19 2356 11/08/19 0831 11/08/19 1537 11/08/19 1958  BP: 118/62 108/66 (!) 122/47 (!) 95/42  Pulse: 91 82 74 74  Resp:    18  Temp: (!) 97.5 F (36.4 C) 98.4 F (36.9 C) 97.8 F (36.6 C) 97.9 F (36.6 C)  TempSrc:    Oral  SpO2: 96% 100% 100% 100%  Weight:      Height:        Intake/Output Summary (Last 24 hours) at 11/09/2019 0744 Last data filed at 11/08/2019 2021 Gross per 24 hour  Intake 120 ml  Output --  Net 120 ml   Last 3 Weights 11/03/2019 11/03/2019 09/06/2019  Weight (lbs) 139 lb 12.4 oz 134 lb 140 lb 12.8 oz  Weight (kg) 63.4 kg 60.782 kg 63.866 kg      Telemetry    NSR with intermittent junctional rhythm- Personally Reviewed  ECG    No new tracings - Personally Reviewed  Physical Exam   GEN: No acute distress.   Neck: No JVD Cardiac: RRR, no murmurs, rubs, or gallops.  Respiratory: Clear to auscultation bilaterally. GI: Soft,  nontender, non-distended  MS: 1+ pitting edema to mid-shin; No deformity. Neuro:  Nonfocal  Psych: Normal affect   Labs    High Sensitivity Troponin:   Recent Labs  Lab 11/04/19 0337 11/04/19 0453  TROPONINIHS 92* 116*      Chemistry Recent Labs  Lab 11/03/19 1539 11/04/19 0337 11/06/19 0335 11/08/19 0530 11/09/19 0348  NA 133*   < > 137 138 137  K 3.9   < > 4.2 3.6 4.2  CL 103   < > 103 106 104  CO2 22   < > 20* 22 21*  GLUCOSE 136*   < > 108* 113* 92  BUN 49*   < > 28* 40* 36*  CREATININE 2.00*   < > 1.45* 1.82* 1.64*  CALCIUM 8.2*   < > 8.5* 8.2* 8.2*  PROT 5.3*  --   --   --   --   ALBUMIN 3.2*  --   --   --   --   AST 30  --   --   --   --   ALT 30  --   --   --   --   ALKPHOS 32*  --   --   --   --  BILITOT 0.8  --   --   --   --   GFRNONAA 22*   < > 33* 25* 28*  GFRAA 26*   < > 38* 29* 33*  ANIONGAP 8   < > 14 10 12    < > = values in this interval not displayed.     Hematology Recent Labs  Lab 11/07/19 0744 11/08/19 0530 11/09/19 0348  WBC 6.5 4.2 3.6*  RBC 2.68* 2.37* 2.31*  HGB 8.5* 7.3* 7.3*  HCT 27.4* 23.6* 23.3*  MCV 102.2* 99.6 100.9*  MCH 31.7 30.8 31.6  MCHC 31.0 30.9 31.3  RDW 20.6* 19.5* 19.2*  PLT 211 160 140*    BNPNo results for input(s): BNP, PROBNP in the last 168 hours.   DDimer No results for input(s): DDIMER in the last 168 hours.   Radiology    No results found.  Cardiac Studies     Patient Profile     86 y.o. female 86 y.o.femalewith a hx of CAD, aortic valve disease s/p CABG and mechanical AVR on chronic Coumadin and aspirin, afib, HFpEF, HTN, HLD, CKD stage III, type II DMpresenting with symptomatic anemia due to GI bleeding.   Assessment & Plan    Symptomatic anemia 2/2 GI bleedin the setting of supratherapeutic INR Chronic Coumadins/pmechanical aortic valve -underwent small bowel enteroscopy 2/14 which revealed non-bleeding gastric ulcer and angioectasia which was treated with APC -hgb stable today  at 7.3 -coumadin resumed; currently on heparin gtt until INR is at goal. INR increased from 1.4>1.6 today. would like to see it reach 1.8 prior to discharge.   Paroxysmal afib -currently in NSR -continue amiodarone, Lopressor -coumadin resumed  CAD s/p CABG, multiple stents -continue lipitor, zetia -aspirinon hold for 4 weeks  Chronic HFpEF -IV lasix held yesterday due to renal function  -Creatinine improved today; can give another dose of IV lasix while she's here      For questions or updates, please contact [place] Please consult [email or web address] for contact info under      Signed, Delice Bison, DO  11/09/2019, 7:44 AM    I have examined the patient and reviewed assessment and plan and discussed with patient.  Agree with above as stated.    INR starting to trend up.  Continue IV heparin for now.  In the past, her INR has moved quickly into the therapeutic range.  She is very sensitive to warfarin.  She is anxious to go home, especially given the anticipated bad weather tomorrow.  I think it is reasonable to let her go home today.  We will schedule close INR follow-up in our office on Friday afternoon.  I suspect she will be in the 2.0-2.5 range by that time.  We will give 1 dose IV Lasix.  Chronic renal insufficiency has improved.  This should help her lower extremity edema.  Larae Grooms

## 2019-11-09 NOTE — Plan of Care (Signed)
Pt has met all care plan goals. She is adequate for discharge.

## 2019-11-09 NOTE — Discharge Summary (Signed)
Physician Discharge Summary  [name] NFA:[id number] DOB: 07-11-1933   PCP: Gaynelle Arabian, MD  Admit date: 11/03/2019 Discharge date: 11/09/2019 Length of Stay: 5 days   Code Status: Full Code  Admitted From:  Home Discharged to:   [place]:  None  Equipment/Devices:  None Discharge Condition:  Stable  Recommendations for Outpatient Follow-up   1. Follow up with PCP in 1 week 2. Follow up CBC in 2 days 3. Patient to follow-up for for INR check on Friday (2 days) with cardiology  Hospital Summary  [name] is an 86 year old Caucasian female with past medical history significant for atrial fibrillation, status post mechanical aortic valve replacement, chronic kidney disease stage III, diastolic congestive heart failure with last echocardiogram revealed an ejection fraction of greater than 65%, chronic lower GI bleed, AV malformation, MI, coronary artery disease, diabetes mellitus, hypertension and hyperlipidemia. Patient is on Coumadin for atrial fibrillation and prostatic aortic heart valve. On 11/01/2019, INR was 4.7. Patient's Coumadin has been on hold. Patient reported significant dark blood per rectum 5 days ago, and has had intermittent rectal bleed since then, though not as significant. On presentation to the hospital, patient's hemoglobin was 5.6 g/dL (baseline of 10.9 g/dL).  Patient admitted for GI bleed and blood transfusion.  GI consulted. Patient underwent small bowel enteroscopy 2/14 and resumed on coumadin and heparin bridge. She was evaluated by cardiology prior to discharge as her INR level was subtherapeutic. Cardiology had evaluated the patient prior to discharge on 2/17 who said that in anticipation of the bad weather and patient's strong desire to go home, she was ok to discharge with INR 1.6 with close follow up in two days in Cardiology office for INR recheck. CBC was also ordered to follow up on anemia.    A & P   Active Problems:   H/O  mechanical aortic valve replacement   Symptomatic anemia   Acute GI bleeding    GI bleed with blood loss anemia -Patient with history of small bowel AVM -FOBT positive -Patient transfused 2 unit packed red blood cell on 2/11 -S/p small bowel enteroscopy 2/14:    Non-bleeding gastric ulcer with no stigmata of bleeding  A single non-bleeding angioectasia in the jejunum. Treated with argon plasma coagulation(APC).  Use Protonix (pantoprazole) 40 mg PO BID x 6 weeks, then once daily indefinitely   No aspirin for 4 weeks, then resume at 81 mg daily.  Upon discharge, once daily slow release iron tablet for 6 weeks. -Folate 45.4, vitamin B12 1072 -Hgb stable at 7.3 yesterday and this morning and patient asymptomatic -follow up CBC  Paroxysmal atrial fibrillation -Continue amiodarone, digoxin (on hold due to renal function) -Resumed coumadin. Goal INR has been 2-2.5 as outpatient.  -Initial goal INR of 1.8 prior to discharge however given the anticipated winter storm and patient's desire to go home, Cardiology stated patient was ok for discharge with INR 1.6 as long as she has close follow up in two days.  Status post prosthetic aortic valve replacement -Resume coumadin. Goal INR has been 2-2.5 as outpatient.  -as above.  Type 2 diabetes -Sliding scale insulin  AKI on CKD stage IIIb-IV -Baseline creatinine 1.5 -Hold Cozaar  Hypertension -Continue Lopressor  CAD -Continue Imdur, hold aspirin  Hyperlipidemia -Continue Lipitor, Zetia  Chronic diastolic HF  -Continue home lasix    Consultants  . Cardiology  Procedures  . 2/14 Small bowel enteroscopy  Antibiotics   Anti-infectives (From admission, onward)   None  Subjective  Patient seen and examined at bedside no acute distress and resting comfortably.  No events overnight.  Tolerating diet. In good spirits and anticipating discharge.  Very anxious about being discharged tomorrow with the  upcoming winter storm and is hoping to go home today.  Denies any chest pain, shortness of breath, fever, nausea, vomiting, urinary or bowel complaints. No further bloody BMs. Tolerating ambulation. Otherwise ROS negative    Objective   Discharge Exam: Vitals:   11/08/19 1958 11/09/19 0746  BP: (!) 95/42 (!) 131/56  Pulse: 74 76  Resp: 18   Temp: 97.9 F (36.6 C) (!) 97.4 F (36.3 C)  SpO2: 100% 100%   Vitals:   11/08/19 0831 11/08/19 1537 11/08/19 1958 11/09/19 0746  BP: 108/66 (!) 122/47 (!) 95/42 (!) 131/56  Pulse: 82 74 74 76  Resp:   18   Temp: 98.4 F (36.9 C) 97.8 F (36.6 C) 97.9 F (36.6 C) (!) 97.4 F (36.3 C)  TempSrc:   Oral   SpO2: 100% 100% 100% 100%  Weight:      Height:        Physical Exam Vitals and nursing note reviewed.  Constitutional:      Appearance: Normal appearance.  HENT:     Head: Normocephalic and atraumatic.  Eyes:     Conjunctiva/sclera: Conjunctivae normal.  Cardiovascular:     Rate and Rhythm: Normal rate and regular rhythm.     Heart sounds: Murmur present.     Comments: Systolic murmur with end systolic click Pulmonary:     Effort: Pulmonary effort is normal.     Breath sounds: Normal breath sounds.  Abdominal:     General: Abdomen is flat.     Palpations: Abdomen is soft.  Musculoskeletal:        General: No swelling or tenderness.  Skin:    Coloration: Skin is not jaundiced or pale.  Neurological:     Mental Status: She is alert. Mental status is at baseline.  Psychiatric:        Mood and Affect: Mood normal.        Behavior: Behavior normal.       The results of significant diagnostics from this hospitalization (including imaging, microbiology, ancillary and laboratory) are listed below for reference.     Microbiology: Recent Results (from the past 240 hour(s))  SARS CORONAVIRUS 2 (TAT 6-24 HRS) Nasopharyngeal Nasopharyngeal Swab     Status: None   Collection Time: 11/03/19  5:05 PM   Specimen:  Nasopharyngeal Swab  Result Value Ref Range Status   SARS Coronavirus 2 NEGATIVE NEGATIVE Final    Comment: (NOTE) SARS-CoV-2 target nucleic acids are NOT DETECTED. The SARS-CoV-2 RNA is generally detectable in upper and lower respiratory specimens during the acute phase of infection. Negative results do not preclude SARS-CoV-2 infection, do not rule out co-infections with other pathogens, and should not be used as the sole basis for treatment or other patient management decisions. Negative results must be combined with clinical observations, patient history, and epidemiological information. The expected result is Negative. Fact Sheet for Patients: SugarRoll.be Fact Sheet for Healthcare Providers: https://www.woods-mathews.com/ This test is not yet approved or cleared by the [place] FDA and  has been authorized for detection and/or diagnosis of SARS-CoV-2 by FDA under an Emergency Use Authorization (EUA). This EUA will remain  in effect (meaning this test can be used) for the duration of the COVID-19 declaration under Section 56 4(b)(1) of the Act, 21 U.S.C. section  360bbb-3(b)(1), unless the authorization is terminated or revoked sooner. Performed at [facility], [place] [address], [place], [place] [address]   MRSA PCR Screening     Status: None   Collection Time: 11/03/19  8:11 PM   Specimen: Nasopharyngeal  Result Value Ref Range Status   MRSA by PCR NEGATIVE NEGATIVE Final    Comment:        The GeneXpert MRSA Assay (FDA approved for NASAL specimens only), is one component of a comprehensive MRSA colonization surveillance program. It is not intended to diagnose MRSA infection nor to guide or monitor treatment for MRSA infections. Performed at [facility], [place] [address] [place], [place] [address]      Labs: BNP (last 3 results) No results for input(s): BNP in the last 8760 hours. Basic Metabolic  Panel: Recent Labs  Lab 11/03/19 1539 11/03/19 1915 11/04/19 0337 11/05/19 0420 11/06/19 0335 11/08/19 0530 11/09/19 0348  NA   < >  --  136 140 137 138 137  K   < >  --  3.8 3.7 4.2 3.6 4.2  CL   < >  --  107 109 103 106 104  CO2   < >  --  23 22 20* 22 21*  GLUCOSE   < >  --  92 97 108* 113* 92  BUN   < >  --  45* 36* 28* 40* 36*  CREATININE   < >  --  1.93* 1.64* 1.45* 1.82* 1.64*  CALCIUM   < >  --  8.1* 8.2* 8.5* 8.2* 8.2*  MG  --  2.1  --   --   --   --   --   PHOS  --  3.8  --   --   --   --   --    < > = values in this interval not displayed.   Liver Function Tests: Recent Labs  Lab 11/03/19 1539  AST 30  ALT 30  ALKPHOS 32*  BILITOT 0.8  PROT 5.3*  ALBUMIN 3.2*   No results for input(s): LIPASE, AMYLASE in the last 168 hours. No results for input(s): AMMONIA in the last 168 hours. CBC: Recent Labs  Lab 11/05/19 0420 11/06/19 0335 11/07/19 0744 11/08/19 0530 11/09/19 0348  WBC 5.6 5.9 6.5 4.2 3.6*  HGB 7.8* 8.6* 8.5* 7.3* 7.3*  HCT 24.8* 27.9* 27.4* 23.6* 23.3*  MCV 99.2 102.6* 102.2* 99.6 100.9*  PLT 148* 178 211 160 140*   Cardiac Enzymes: No results for input(s): CKTOTAL, CKMB, CKMBINDEX, TROPONINI in the last 168 hours. BNP: Invalid input(s): POCBNP CBG: Recent Labs  Lab 11/08/19 1927 11/08/19 2005 11/09/19 0341 11/09/19 0748 11/09/19 1126  GLUCAP 154* 154* 93 86 128*   D-Dimer No results for input(s): DDIMER in the last 72 hours. Hgb A1c No results for input(s): HGBA1C in the last 72 hours. Lipid Profile No results for input(s): CHOL, HDL, LDLCALC, TRIG, CHOLHDL, LDLDIRECT in the last 72 hours. Thyroid function studies No results for input(s): TSH, T4TOTAL, T3FREE, THYROIDAB in the last 72 hours.  Invalid input(s): FREET3 Anemia work up Recent Labs    11/07/19 0744  VITAMINB12 1,072*  FOLATE 45.4   Urinalysis    Component Value Date/Time   COLORURINE YELLOW 11/28/2018 0058   APPEARANCEUR HAZY (A) 11/28/2018 0058    LABSPEC 1.014 11/28/2018 0058   PHURINE 6.0 11/28/2018 0058   GLUCOSEU >=500 (A) 11/28/2018 0058   HGBUR MODERATE (A) 11/28/2018 0058  BILIRUBINUR NEGATIVE 11/28/2018 [place] 11/28/2018 0058   PROTEINUR 30 (A) 11/28/2018 0058   UROBILINOGEN 0.2 04/20/2008 2135   NITRITE POSITIVE (A) 11/28/2018 0058   LEUKOCYTESUR MODERATE (A) 11/28/2018 0058   Sepsis Labs Invalid input(s): PROCALCITONIN,  WBC,  LACTICIDVEN Microbiology Recent Results (from the past 240 hour(s))  SARS CORONAVIRUS 2 (TAT 6-24 HRS) Nasopharyngeal Nasopharyngeal Swab     Status: None   Collection Time: 11/03/19  5:05 PM   Specimen: Nasopharyngeal Swab  Result Value Ref Range Status   SARS Coronavirus 2 NEGATIVE NEGATIVE Final    Comment: (NOTE) SARS-CoV-2 target nucleic acids are NOT DETECTED. The SARS-CoV-2 RNA is generally detectable in upper and lower respiratory specimens during the acute phase of infection. Negative results do not preclude SARS-CoV-2 infection, do not rule out co-infections with other pathogens, and should not be used as the sole basis for treatment or other patient management decisions. Negative results must be combined with clinical observations, patient history, and epidemiological information. The expected result is Negative. Fact Sheet for Patients: SugarRoll.be Fact Sheet for Healthcare Providers: https://www.woods-mathews.com/ This test is not yet approved or cleared by the [place] FDA and  has been authorized for detection and/or diagnosis of SARS-CoV-2 by FDA under an Emergency Use Authorization (EUA). This EUA will remain  in effect (meaning this test can be used) for the duration of the COVID-19 declaration under Section 56 4(b)(1) of the Act, 21 U.S.C. section 360bbb-3(b)(1), unless the authorization is terminated or revoked sooner. Performed at [facility], [place] [address] [place], [place] [address]    MRSA PCR Screening     Status: None   Collection Time: 11/03/19  8:11 PM   Specimen: Nasopharyngeal  Result Value Ref Range Status   MRSA by PCR NEGATIVE NEGATIVE Final    Comment:        The GeneXpert MRSA Assay (FDA approved for NASAL specimens only), is one component of a comprehensive MRSA colonization surveillance program. It is not intended to diagnose MRSA infection nor to guide or monitor treatment for MRSA infections. Performed at [facility], [place] [address], [place], [place] [address]     Discharge Instructions     Discharge Instructions    Diet - low sodium heart healthy   Complete by: As directed    Discharge instructions   Complete by: As directed    You were seen and examined in the hospital for GI bleed and cared for by a hospitalist.   Upon Discharge:  -Do not take aspirin for the next 4 weeks -Take pantoprazole 40 mg twice daily for the next 6 weeks -You are to be set up with a follow-up appointment on Friday to reevaluate your INR.  Get blood work prior to this appointment to check your blood levels as well -Take warfarin as prescribed Make an appointment with your primary care physician within 7 days Get lab work prior to your follow up appointment with your PCP Bring all home medications to your appointment to review Request that your primary physician go over all hospital tests and procedures/radiological results at the follow up.   Please get all hospital records sent to your physician by signing a hospital release before you go home.   Read the complete instructions along with all the possible side effects for all the medicines you take and that have been prescribed to you. Take any new medicines after you have completely understood and accept all the possible adverse  reactions/side effects.   If you have any questions about your discharge medications or the care you received while you were in the hospital, you can call the unit and asked  to speak with the hospitalist on call. Once you are discharged, your primary care physician will handle any further medical issues. Please note that NO REFILLS for any discharge medications will be authorized, as it is imperative that you return to your primary care physician (or establish a relationship with a primary care physician if you do not have one) for your aftercare needs so that they can reassess your need for medications and monitor your lab values.   Do not drive, operate heavy machinery, perform activities at heights, swimming or participation in water activities or provide baby sitting services if your were admitted for loss of consciousness/seizures or if you are on sedating medications including, but not limited to benzodiazepines, sleep medications, narcotic pain medications, etc., until you have been cleared to do so by a medical doctor.   Do not take more than prescribed medications.   Wear a seat belt while driving.  If you have smoked or chewed Tobacco in the last 2 years please stop smoking; also stop any regular Alcohol and/or any Recreational drug use including marijuana.  If you experience worsening of your admission symptoms or develop shortness of breath, chest pain, suicidal or homicidal thoughts or experience a life threatening emergency, you must seek medical attention immediately by calling 911 or calling your PCP immediately.   Increase activity slowly   Complete by: As directed      Allergies as of 11/09/2019      Reactions   Darvon [propoxyphene] Nausea And Vomiting   Lisinopril Cough   Septra [sulfamethoxazole-trimethoprim] Other (See Comments)   Increased INR   Warfarin And Related Other (See Comments)   ONLY TOLERATES BRAND   Penicillins Rash   Did it involve swelling of the face/tongue/throat, SOB, or low BP? No Did it involve sudden or severe rash/hives, skin peeling, or any reaction on the inside of your mouth or nose? No Did you need to seek  medical attention at a hospital or doctor's office? No When did it last happen?15 years If all above answers are "NO", may proceed with cephalosporin use.      Medication List    STOP taking these medications   aspirin EC 81 MG tablet   digoxin 0.125 MG tablet Commonly known as: LANOXIN     TAKE these medications   acetaminophen 500 MG tablet Commonly known as: TYLENOL Take 500 mg by mouth every 6 (six) hours as needed for mild pain or headache.   amiodarone 200 MG tablet Commonly known as: Pacerone Take 1 tablet (200 mg total) by mouth daily.   atorvastatin 80 MG tablet Commonly known as: LIPITOR Take 1 tablet by mouth once daily What changed: when to take this   bevacizumab 1.25 mg/0.1 mL Soln Commonly known as: AVASTIN Place 1.25 mg into the left eye every 8 (eight) weeks.   ezetimibe 10 MG tablet Commonly known as: ZETIA TAKE 1 TABLET BY MOUTH ONCE DAILY PLEASE  MAKE  OVERDUE  APPOINTMENT  WITH  DR  Irish Lack  BEFORE  ANYMORE  REFILLS What changed: See the new instructions.   FISH OIL PO Take 1 capsule by mouth in the morning and at bedtime.   folic acid 585 MCG tablet Commonly known as: FOLVITE Take 400 mcg by mouth every evening.   furosemide 20 MG tablet Commonly  known as: LASIX Take 1 tablet (20 mg total) by mouth daily. You may take an extra 20 mg as needed for 2-3 lb weight gain overnight What changed:   when to take this  additional instructions   isosorbide mononitrate 60 MG 24 hr tablet Commonly known as: IMDUR Take 1 tablet (60 mg total) by mouth daily.   Jantoven 5 MG tablet Generic drug: warfarin Take as directed. If you are unsure how to take this medication, talk to your nurse or doctor. Original instructions: TAKE 1/2 TO 1 (ONE-HALF TO ONE) TABLET BY MOUTH ONCE DAILY AS  DIRECTED  BY  COUMADIN  CLINIC What changed: See the new instructions.   Januvia 50 MG tablet Generic drug: sitaGLIPtin Take 50 mg by mouth daily.   losartan  50 MG tablet Commonly known as: COZAAR Take 50 mg by mouth daily.   metoprolol tartrate 25 MG tablet Commonly known as: LOPRESSOR Take 1 tablet (25 mg total) by mouth daily. What changed: when to take this   multivitamin with minerals Tabs tablet Take 1 tablet by mouth daily.   nitroGLYCERIN 0.4 MG SL tablet Commonly known as: NITROSTAT Place 1 tablet (0.4 mg total) under the tongue every 5 (five) minutes as needed for chest pain. Please make appt for January. 1st attempt What changed: additional instructions   pantoprazole 40 MG tablet Commonly known as: PROTONIX Take 1 tablet (40 mg total) by mouth 2 (two) times daily before a meal.   potassium chloride 10 MEQ tablet Commonly known as: KLOR-CON Take 10 mEq by mouth 2 (two) times daily.   PRESERVISION AREDS 2 PO Take 1 tablet by mouth 2 (two) times daily.   Se-Tan PLUS 162-115.2-1 MG Caps Take 1 capsule by mouth 2 (two) times daily.      Follow-up Information    [facility] Follow up.   Specialty: Cardiology Why: You have an appointment with our Coumad[facility] on Friday 11/11/2019 at 1:00pm. Please arrive 15 minutes early for check-in.  Contact information: [address] [place] [address] [phone]         Allergies  Allergen Reactions  . Darvon [Propoxyphene] Nausea And Vomiting  . Lisinopril Cough  . Septra [Sulfamethoxazole-Trimethoprim] Other (See Comments)    Increased INR  . Warfarin And Related Other (See Comments)    ONLY TOLERATES BRAND  . Penicillins Rash    Did it involve swelling of the face/tongue/throat, SOB, or low BP? No Did it involve sudden or severe rash/hives, skin peeling, or any reaction on the inside of your mouth or nose? No Did you need to seek medical attention at a hospital or doctor's office? No When did it last happen?15 years If all above answers are "NO", may proceed with cephalosporin use.    Time coordinating  discharge: Over 30 minutes   SIGNED:   Harold Hedge, D.O. [facility] Pager: [phone]  11/09/2019, 2:06 PM

## 2019-11-09 NOTE — Progress Notes (Addendum)
[place] for heparin, warfarin  Indication: mechanical AVR  Allergies  Allergen Reactions  . Darvon [Propoxyphene] Nausea And Vomiting  . Lisinopril Cough  . Septra [Sulfamethoxazole-Trimethoprim] Other (See Comments)    Increased INR  . Warfarin And Related Other (See Comments)    ONLY TOLERATES BRAND  . Penicillins Rash    Did it involve swelling of the face/tongue/throat, SOB, or low BP? No Did it involve sudden or severe rash/hives, skin peeling, or any reaction on the inside of your mouth or nose? No Did you need to seek medical attention at a hospital or doctor's office? No When did it last happen?15 years If all above answers are "NO", may proceed with cephalosporin use.    Patient Measurements: Height: 5' (152.4 cm) Weight: 139 lb 12.4 oz (63.4 kg) IBW/kg (Calculated) : 45.5   Vital Signs: Temp: 97.4 F (36.3 C) (02/17 0746) BP: 131/56 (02/17 0746) Pulse Rate: 76 (02/17 0746)  Labs: Recent Labs    11/07/19 0744 11/07/19 1838 11/08/19 0530 11/08/19 0530 11/08/19 1442 11/08/19 2229 11/09/19 0348 11/09/19 0734  HGB 8.5*  --  7.3*  --   --   --  7.3*  --   HCT 27.4*  --  23.6*  --   --   --  23.3*  --   PLT 211  --  160  --   --   --  140*  --   LABPROT 16.8*  --  17.3*  --   --   --  18.5*  --   INR 1.4*  --  1.4*  --   --   --  1.6*  --   HEPARINUNFRC 0.91*   < > <0.10*   < > 0.62 0.73*  --  0.94*  CREATININE  --   --  1.82*  --   --   --  1.64*  --    < > = values in this interval not displayed.    Estimated Creatinine Clearance: 21.2 mL/min (A) (by C-G formula based on SCr of 1.64 mg/dL (H)).  Assessment: 86 yo female with GIB. She in on warfarin PTA; sp enteroscopy  Warfarin restarted and remains on heparin until therapeutic   Hep lvl high this am at 0.94 (aptt 179) - lab drawn from opposite arm of infusion, no bleeding per RN  INR 1.6  CBC Stable  Goal of Therapy:  Heparin level 0.3-0.5 INR 2 -  2.5 Monitor platelets by anticoagulation protocol: Yes   Plan:  Hold heparin x 1 hr Resume at 250 units/hr heparin Warfarin 5 mg x 1 1800 Hep lvl Daily hep lvl cbc inr  Barth Kirks, PharmD, BCPS, BCCCP Clinical Pharmacist [phone]  Please check AMION for all [place] numbers  11/09/2019 9:04 AM

## 2019-11-11 ENCOUNTER — Telehealth: Payer: Self-pay | Admitting: Gastroenterology

## 2019-11-11 ENCOUNTER — Ambulatory Visit (INDEPENDENT_AMBULATORY_CARE_PROVIDER_SITE_OTHER): Payer: Medicare Other | Admitting: Pharmacist

## 2019-11-11 ENCOUNTER — Other Ambulatory Visit: Payer: Self-pay

## 2019-11-11 DIAGNOSIS — I359 Nonrheumatic aortic valve disorder, unspecified: Secondary | ICD-10-CM | POA: Diagnosis not present

## 2019-11-11 DIAGNOSIS — Z5181 Encounter for therapeutic drug level monitoring: Secondary | ICD-10-CM | POA: Diagnosis not present

## 2019-11-11 LAB — POCT INR: INR: 2.2 (ref 2.0–3.0)

## 2019-11-11 MED ORDER — BISMUTH SUBSALICYLATE 262 MG PO CHEW: 524.0000 mg | CHEWABLE_TABLET | Freq: Three times a day (TID) | ORAL | 0 refills | Status: DC

## 2019-11-11 MED ORDER — LEVOFLOXACIN 500 MG PO TABS: 500.0000 mg | ORAL_TABLET | Freq: Every day | ORAL | 0 refills | Status: DC

## 2019-11-11 MED ORDER — DOXYCYCLINE HYCLATE 100 MG PO TBEC: 100.0000 mg | DELAYED_RELEASE_TABLET | Freq: Two times a day (BID) | ORAL | 0 refills | Status: DC

## 2019-11-11 NOTE — Telephone Encounter (Signed)
Per pharmacy. Patient cannot use clarthromycin. Due to interaction with amiodarone. Flagyl and coumadin cannot be taken together.

## 2019-11-11 NOTE — Patient Instructions (Signed)
Start taking 1/2 tablet every day except 1 tablet on Tuesday, Thursday and Saturday. Recheck INR in 1 week. Call our office if you have any problems or concerns [phone].

## 2019-11-11 NOTE — Telephone Encounter (Signed)
I will need to ask the advice of a colleague in infectious disease and then contact her as soon as we can.

## 2019-11-11 NOTE — Telephone Encounter (Signed)
This is going to be difficult with all the potential medicine interactions.  Please ask pharmacist if clarithromycin can be used.  It is nearly the last antibiotic we can use for this. The dose would be 500 mg twice daily x 14 days.  I believe metronidazole is contraindicated due to the warfrain, but please check that with them as well.  Be sure the patient does not start the other antibiotics until we figure this out.  Must start all meds at same time.

## 2019-11-11 NOTE — Telephone Encounter (Signed)
Patients heart medications are jantoven and amiodarone would you like to change therapy, per pharmacy there is a reaction.

## 2019-11-11 NOTE — Telephone Encounter (Signed)
Patient has been notified to not start any of the new medications for H.pylori until they can all be taken together

## 2019-11-13 NOTE — Progress Notes (Signed)
Cardiology Office Note   Date:  11/14/2019   ID:  [name], [name] 25-May-1933, MRN [id number]  PCP:  Gaynelle Arabian, MD    No chief complaint on file.  CAD/AVR  Wt Readings from Last 3 Encounters:  11/14/19 142 lb 12.8 oz (64.8 kg)  11/03/19 139 lb 12.4 oz (63.4 kg)  09/06/19 140 lb 12.8 oz (63.9 kg)       History of Present Illness: [name] is a 86 y.o. female  with a hx of CAD and aortic valve disease status post CABG plus mechanical AVR, subsequent PCI to the RCA with BMS, HTN, HL, diabetes, prior GI bleed, CKD, diastolic CHF, PAF.   She was evaluated in 7/16 for anginal symptoms. LHC demonstrated severe native three-vessel disease. LIMA-LAD was patent, SVG-diagonal patent, SVG-OM/PDA occluded. The second half of the OM/PDA graft was open. RCA stents were patent. She had an ostial RCA lesion of 75% which was treated with a Synergy DES. Because she needs Coumadin for her mechanical AVR, she was continued on Coumadin plus Plavix only (no aspirin).  She had some melenaafter the procedure. She has had several episodes of GI bleeding in the past.  Patient had sepsis due to gram-negative bacteremia E. coli related to UTI 11/2018. Echo with normal LV function moderate LVH normally functioning mechanical aortic valve possible mitral valve with small mobile vegetation in the anterior leaflet and small ASD suggested by Doppler.  TEE was negative for vegetations. Patient underwent successful DCCV 08/15/19 for paroxysmal atrial fibrillation.  Shortness of breath improved after this procedure.  She had more GI bleeding in February 2021.  Anticoagulation was held.  She underwent enteroscopy.  She had an argon laser treatment with EGD.  Hemoglobin was 7.3 at the time of discharge.  INR had slightly increased and was therapeutic by November 11, 2019.  It was recommended that aspirin be held for 4 weeks.  Since discharge from the hospital, she has had right flank pain and right  leg pain.  No blood in urine.  She feels weak and cannot sleep well.   Past Medical History:  Diagnosis Date  . Anemia   . Anginal pain ([place])   . Arteriovenous malformation of gastrointestinal tract   . Arthritis    "fingers mostly" (04/12/2015)  . Carotid artery disease ([place])    Carotid US 3/17:  50-53% RICA; 97-67% LICA; Elevated bilateral subclavian artery velocities >>f/u 1 year. // Carotid US 4/18: R 40-59; L 1-39 >> FU 1 year // Carotid US 10/2018: R 40-59; L 1-39, L subclavian stenosis   . CHF (congestive heart failure) ([place])   . Chronic kidney disease (CKD), stage III (moderate)   . Chronic lower GI bleeding    "today; last time was ~ 8 yr ago; used to have them often before that too" (02/11/2013)  . Coronary artery disease   . DJD (degenerative joint disease)   . Dysrhythmia   . Heart murmur   . History of blood transfusion    "a few times over the years; usually related to my Coumadin" (04/12/2015  . History of gout   . Hyperlipemia   . Hypertension   . Macula lutea degeneration   . Old MI (myocardial infarction)    "a coulple /dr in 02/2008; I never even knew I'd had them" (04/12/2015)  . Type II diabetes mellitus ([place])     Past Surgical History:  Procedure Laterality Date  . AORTIC VALVE REPLACEMENT  1993  .  APPENDECTOMY  1953  . BIOPSY  11/06/2019   Procedure: BIOPSY;  Surgeon: Doran Stabler, MD;  Location: [facility];  Service: Gastroenterology;;  . CARDIAC CATHETERIZATION  [id number]  . CARDIAC CATHETERIZATION  04/12/2015   Procedure: Coronary Stent Intervention;  Surgeon: Jettie Booze, MD;    SYNERGY DES 3.5X16 to the ostial RCA   . CARDIAC CATHETERIZATION  04/12/2015   Procedure: Coronary/Graft Angiography;  Surgeon: Eloy End, MD; LAD & CFX 100%, patent LIMA-LAD, SVG-D1; SVG-OM-PDA first limb 100%, 2nd limb patent; oRCA 75%>0 w/ stent  . [place]   St. Jude/notes 10/29/2003 (02/11/2013)  . CARDIOVERSION N/A 08/15/2019    Procedure: CARDIOVERSION;  Surgeon: Acie Fredrickson Wonda Cheng, MD;  Location: [facility] ENDOSCOPY;  Service: Cardiovascular;  Laterality: N/A;  . COLONOSCOPY N/A 02/13/2013   Procedure: COLONOSCOPY;  Surgeon: Lafayette Dragon, MD;  Location: [facility] ENDOSCOPY;  Service: Endoscopy;  Laterality: N/A;  . CORONARY ANGIOPLASTY WITH STENT PLACEMENT  2009+   "3 at least; put in 1 stent at a time" (02/11/2013)  . CORONARY ARTERY BYPASS GRAFT  1990   LIMA-LAD, SVG-OM-PDA, SVG-D1  . DILATION AND CURETTAGE OF UTERUS  1960's   'after a miscarriage" (02/11/2013)  . ENTEROSCOPY N/A 02/13/2013   Procedure: ENTEROSCOPY;  Surgeon: Lafayette Dragon, MD;  Location: [facility] ENDOSCOPY;  Service: Endoscopy;  Laterality: N/A;  . ENTEROSCOPY N/A 11/06/2019   Procedure: ENTEROSCOPY;  Surgeon: Doran Stabler, MD;  Location: [facility] ENDOSCOPY;  Service: Gastroenterology;  Laterality: N/A;  . EXPLORATORY LAPAROTOMY  02/24/2008   which revealed a retroperitoneal hematoma and bleeding from the right external iliac artery/notes 03/02/2008 (02/11/2013)   . FLEXIBLE SIGMOIDOSCOPY N/A 04/21/2015   Procedure: FLEXIBLE SIGMOIDOSCOPY;  Surgeon: Ladene Artist, MD;  Location: [facility] ENDOSCOPY;  Service: Endoscopy;  Laterality: N/A;  . HOT HEMOSTASIS N/A 11/06/2019   Procedure: HOT HEMOSTASIS (ARGON PLASMA COAGULATION/BICAP);  Surgeon: Doran Stabler, MD;  Location: [facility];  Service: Gastroenterology;  Laterality: N/A;  . TEE WITHOUT CARDIOVERSION N/A 12/01/2018   Procedure: TRANSESOPHAGEAL ECHOCARDIOGRAM (TEE);  Surgeon: Jerline Pain, MD;  Location: [facility] ENDOSCOPY;  Service: Cardiovascular;  Laterality: N/A;     Current Outpatient Medications  Medication Sig Dispense Refill  . acetaminophen (TYLENOL) 500 MG tablet Take 500 mg by mouth every 6 (six) hours as needed for mild pain or headache.    Marland Kitchen amiodarone (PACERONE) 200 MG tablet Take 1 tablet (200 mg total) by mouth daily. 90 tablet 3  . atorvastatin (LIPITOR) 80 MG tablet Take 1 tablet by mouth once daily 90  tablet 3  . bevacizumab (AVASTIN) 1.25 mg/0.1 mL SOLN Place 1.25 mg into the left eye every 8 (eight) weeks.    . bismuth subsalicylate (PEPTO BISMOL) 262 MG chewable tablet Chew 2 tablets (524 mg total) by mouth 3 (three) times daily. 84 tablet 0  . doxycycline (DORYX) 100 MG EC tablet Take 1 tablet (100 mg total) by mouth 2 (two) times daily. 28 tablet 0  . ezetimibe (ZETIA) 10 MG tablet TAKE 1 TABLET BY MOUTH ONCE DAILY PLEASE  MAKE  OVERDUE  APPOINTMENT  WITH  DR  Irish Lack  BEFORE  ANYMORE  REFILLS 90 tablet 3  . FeFum-FePo-FA-B Cmp-C-Zn-Mn-Cu (SE-TAN PLUS) 162-115.2-1 MG CAPS Take 1 capsule by mouth 2 (two) times daily.   3  . folic acid (FOLVITE) 824 MCG tablet Take 400 mcg by mouth every evening.     . furosemide (LASIX) 20 MG tablet Take 1 tablet (  20 mg total) by mouth daily. You may take an extra 20 mg as needed for 2-3 lb weight gain overnight 90 tablet 3  . isosorbide mononitrate (IMDUR) 60 MG 24 hr tablet Take 1 tablet (60 mg total) by mouth daily. 90 tablet 3  . JANTOVEN 5 MG tablet TAKE 1/2 TO 1 (ONE-HALF TO ONE) TABLET BY MOUTH ONCE DAILY AS  DIRECTED  BY  COUMADIN  CLINIC (Patient taking differently: Take 2.5-5 mg by mouth See admin instructions. Take 2.5 mg by mouth at bedtime on Sun/Wed/Fri and 5 mg on Mon/Tues/Thurs/Sat) 90 tablet 0  . JANUVIA 50 MG tablet Take 50 mg by mouth daily.     Marland Kitchen levofloxacin (LEVAQUIN) 500 MG tablet Take 1 tablet (500 mg total) by mouth daily. 14 tablet 0  . losartan (COZAAR) 50 MG tablet Take 50 mg by mouth daily.    . metoprolol tartrate (LOPRESSOR) 25 MG tablet Take 1 tablet (25 mg total) by mouth daily. (Patient taking differently: Take 25 mg by mouth 2 (two) times daily. ) 90 tablet 3  . Multiple Vitamin (MULTIVITAMIN WITH MINERALS) TABS Take 1 tablet by mouth daily.    . Multiple Vitamins-Minerals (PRESERVISION AREDS 2 PO) Take 1 tablet by mouth 2 (two) times daily.     . nitroGLYCERIN (NITROSTAT) 0.4 MG SL tablet Place 1 tablet (0.4 mg total) under  the tongue every 5 (five) minutes as needed for chest pain. Please make appt for January. 1st attempt 25 tablet 0  . Omega-3 Fatty Acids (FISH OIL PO) Take 1 capsule by mouth in the morning and at bedtime.    . pantoprazole (PROTONIX) 40 MG tablet Take 1 tablet (40 mg total) by mouth 2 (two) times daily before a meal. 90 tablet 0  . potassium chloride (KLOR-CON) 10 MEQ tablet Take 10 mEq by mouth 2 (two) times daily.     No current facility-administered medications for this visit.    Allergies:   Darvon [propoxyphene], Lisinopril, Septra [sulfamethoxazole-trimethoprim], Warfarin and related, and Penicillins    Social History:  The patient  reports that she has never smoked. She has never used smokeless tobacco. She reports that she does not drink alcohol or use drugs.   Family History:  The patient's family history includes Heart disease in her father and mother.    ROS:  Please see the history of present illness.   Otherwise, review of systems are positive for back pain.   All other systems are reviewed and negative.    PHYSICAL EXAM: VS:  BP (!) 112/48   Pulse (!) 58   Ht 5' (1.524 m)   Wt 142 lb 12.8 oz (64.8 kg)   SpO2 97%   BMI 27.89 kg/m  , BMI Body mass index is 27.89 kg/m. GEN: Well nourished, well developed, in no acute distress , pale HEENT: normal  Neck: no JVD, carotid bruits, or masses Cardiac: RRR; 2/6 murmur, no rubs, or gallops,no edema  Respiratory:  clear to auscultation bilaterally, normal work of breathing GI: soft, nontender, nondistended, + BS MS: no deformity or atrophy  Skin: warm and dry, no rash Neuro:  Strength and sensation are intact Psych: euthymic mood, full affect   EKG:   The ekg ordered today demonstrates junctional rhythm, IVCD   Recent Labs: 06/29/2019: TSH 4.260 11/03/2019: ALT 30; Magnesium 2.1 11/09/2019: BUN 36; Creatinine, Ser 1.64; Hemoglobin 7.3; Platelets 140; Potassium 4.2; Sodium 137   Lipid Panel    Component Value  Date/Time   CHOL 117  06/29/2019 0953   CHOL 172 05/01/2015 1234   TRIG 153 (H) 06/29/2019 0953   TRIG 248 (H) 05/01/2015 1234   HDL 51 06/29/2019 0953   HDL 46 05/01/2015 1234   CHOLHDL 2.3 06/29/2019 0953   LDLCALC 40 06/29/2019 0953   LDLCALC 76 05/01/2015 1234     Other studies Reviewed: Additional studies/ records that were reviewed today with results demonstrating: hospital records reviewed.   ASSESSMENT AND PLAN:  1. CAD: Status post bare-metal stent to the distal RCA many years ago and drug-eluting stent to the ostial RCA in 2016.  Due to bleeding risk, has not been treated with phenoperidine. 2. AFib: Anticoagulated with warfarin for stroke prevention.  Underwent cardioversion in November 2020. 3. Anticoagulated: INR therapeutic 3 days ago. 4. S/p AVR: SBE prophylaxis. 5. HTN: The current medical regimen is effective;  continue present plan and medications. 6. OK to use Benadryl to help sleep.  7. Chronic diastolic heart failure: Was treated with IV Lasix in the hospital a few days ago.   8. Make an appt with Dr. Marisue Humble given her weakness.  Will stop metoprolol.  SHe is on Amio to maintain NSR.  Now with junctional rhythm on ECG. Msay be related to anemia. Check CBC as well.  9. Check u/a given flank pain.    Current medicines are reviewed at length with the patient today.  The patient concerns regarding her medicines were addressed.  The following changes have been made:  No change  Labs/ tests ordered today include: CBC, U/A No orders of the defined types were placed in this encounter.   Recommend 150 minutes/week of aerobic exercise Low fat, low carb, high fiber diet recommended  Disposition:   FU in 1 year   Signed, Larae Grooms, MD  11/14/2019 9:35 AM    [facility] [address], [place], [place]  [address] Phone: [phone]; Fax: [phone]

## 2019-11-14 ENCOUNTER — Ambulatory Visit (INDEPENDENT_AMBULATORY_CARE_PROVIDER_SITE_OTHER): Payer: Medicare Other | Admitting: Interventional Cardiology

## 2019-11-14 ENCOUNTER — Encounter: Payer: Self-pay | Admitting: Interventional Cardiology

## 2019-11-14 ENCOUNTER — Other Ambulatory Visit: Payer: Self-pay

## 2019-11-14 VITALS — BP 112/48 | HR 58 | Ht 60.0 in | Wt 142.8 lb

## 2019-11-14 DIAGNOSIS — I2510 Atherosclerotic heart disease of native coronary artery without angina pectoris: Secondary | ICD-10-CM | POA: Diagnosis not present

## 2019-11-14 DIAGNOSIS — E785 Hyperlipidemia, unspecified: Secondary | ICD-10-CM | POA: Diagnosis not present

## 2019-11-14 DIAGNOSIS — I480 Paroxysmal atrial fibrillation: Secondary | ICD-10-CM

## 2019-11-14 DIAGNOSIS — Z5181 Encounter for therapeutic drug level monitoring: Secondary | ICD-10-CM | POA: Diagnosis not present

## 2019-11-14 DIAGNOSIS — I359 Nonrheumatic aortic valve disorder, unspecified: Secondary | ICD-10-CM | POA: Diagnosis not present

## 2019-11-14 DIAGNOSIS — I5032 Chronic diastolic (congestive) heart failure: Secondary | ICD-10-CM | POA: Diagnosis not present

## 2019-11-14 DIAGNOSIS — I10 Essential (primary) hypertension: Secondary | ICD-10-CM | POA: Diagnosis not present

## 2019-11-14 LAB — CBC
Hematocrit: 22.4 % — ABNORMAL LOW (ref 34.0–46.6)
Hemoglobin: 7.3 g/dL — ABNORMAL LOW (ref 11.1–15.9)
MCH: 30.8 pg (ref 26.6–33.0)
MCHC: 32.6 g/dL (ref 31.5–35.7)
MCV: 95 fL (ref 79–97)
Platelets: 234 10*3/uL (ref 150–450)
RBC: 2.37 x10E6/uL — CL (ref 3.77–5.28)
RDW: 15.7 % — ABNORMAL HIGH (ref 11.7–15.4)
WBC: 6.1 10*3/uL (ref 3.4–10.8)

## 2019-11-14 LAB — URINALYSIS
Bilirubin, UA: NEGATIVE
Glucose, UA: NEGATIVE
Ketones, UA: NEGATIVE
Nitrite, UA: NEGATIVE
Specific Gravity, UA: 1.015 (ref 1.005–1.030)
Urobilinogen, Ur: 0.2 mg/dL (ref 0.2–1.0)
pH, UA: 6 (ref 5.0–7.5)

## 2019-11-14 NOTE — Patient Instructions (Addendum)
Medication Instructions:  Your physician has recommended you make the following change in your medication:   STOP: metoprolol  If you need a refill on your cardiac medications before your next appointment, please call your pharmacy.   Lab work: TODAY: CBC, UA  If you have labs (blood work) drawn today and your tests are completely normal, you will receive your results only by: Marland Kitchen MyChart Message (if you have MyChart) OR . A paper copy in the mail If you have any lab test that is abnormal or we need to change your treatment, we will call you to review the results.  Testing/Procedures: None ordered  Follow-Up: Follow up with Dr. Irish Lack on 02/13/20 at 9:00 AM  Any Other Special Instructions Will Be Listed Below (If Applicable).  You may take benadryl at night to help with sleep

## 2019-11-15 NOTE — Telephone Encounter (Signed)
[facility] is offering to see this patient in consult regarding treatment of the H pylori infection. I think that would be best, given the complexity and risks of various antibiotic choices. Please inform [name] of that, and send a referral to Dr. Bobby Rumpf.  Thanks

## 2019-11-16 ENCOUNTER — Other Ambulatory Visit: Payer: Self-pay

## 2019-11-16 DIAGNOSIS — A048 Other specified bacterial intestinal infections: Secondary | ICD-10-CM

## 2019-11-16 NOTE — Telephone Encounter (Signed)
Referral has been sent to Dr Johnnye Sima at [facility]. Pt has been made aware they will reach out to her to schedule.

## 2019-11-17 NOTE — Telephone Encounter (Signed)
Pt has been scheduled for 11-22-2019 with the I.D clinic

## 2019-11-18 ENCOUNTER — Other Ambulatory Visit: Payer: Self-pay | Admitting: Family Medicine

## 2019-11-18 ENCOUNTER — Ambulatory Visit
Admission: RE | Admit: 2019-11-18 | Discharge: 2019-11-18 | Disposition: A | Discharge status: Home / Self Care | Payer: Medicare Other | Source: Ambulatory Visit | Attending: Family Medicine | Admitting: Family Medicine

## 2019-11-18 DIAGNOSIS — M7989 Other specified soft tissue disorders: Secondary | ICD-10-CM

## 2019-11-18 DIAGNOSIS — M79604 Pain in right leg: Secondary | ICD-10-CM

## 2019-11-18 DIAGNOSIS — M79661 Pain in right lower leg: Secondary | ICD-10-CM | POA: Diagnosis not present

## 2019-11-18 DIAGNOSIS — K922 Gastrointestinal hemorrhage, unspecified: Secondary | ICD-10-CM | POA: Diagnosis not present

## 2019-11-18 DIAGNOSIS — M79606 Pain in leg, unspecified: Secondary | ICD-10-CM | POA: Diagnosis not present

## 2019-11-18 DIAGNOSIS — D649 Anemia, unspecified: Secondary | ICD-10-CM | POA: Diagnosis not present

## 2019-11-18 DIAGNOSIS — I2510 Atherosclerotic heart disease of native coronary artery without angina pectoris: Secondary | ICD-10-CM | POA: Diagnosis not present

## 2019-11-21 ENCOUNTER — Other Ambulatory Visit: Payer: Self-pay

## 2019-11-21 ENCOUNTER — Telehealth: Payer: Self-pay

## 2019-11-21 ENCOUNTER — Ambulatory Visit (INDEPENDENT_AMBULATORY_CARE_PROVIDER_SITE_OTHER): Payer: Medicare Other | Admitting: Pharmacist

## 2019-11-21 DIAGNOSIS — Z5181 Encounter for therapeutic drug level monitoring: Secondary | ICD-10-CM

## 2019-11-21 DIAGNOSIS — I359 Nonrheumatic aortic valve disorder, unspecified: Secondary | ICD-10-CM

## 2019-11-21 DIAGNOSIS — K259 Gastric ulcer, unspecified as acute or chronic, without hemorrhage or perforation: Secondary | ICD-10-CM | POA: Insufficient documentation

## 2019-11-21 DIAGNOSIS — B9681 Helicobacter pylori [H. pylori] as the cause of diseases classified elsewhere: Secondary | ICD-10-CM | POA: Insufficient documentation

## 2019-11-21 LAB — POCT INR: INR: 5.4 — AB (ref 2.0–3.0)

## 2019-11-21 NOTE — Telephone Encounter (Signed)
COVID-19 Pre-Screening Questions:11/21/19   Do you currently have a fever (>100 F), chills or unexplained body aches? NO   Are you currently experiencing new cough, shortness of breath, sore throat, runny nose?NO  .  Have you recently travelled outside the state of [place] in the last 14 days? NO  .  Have you been in contact with someone that is currently pending confirmation of Covid19 testing or has been confirmed to have the Covid19 virus?  NO  **If the patient answers NO to ALL questions -  advise the patient to please call the clinic before coming to the office should any symptoms develop.     

## 2019-11-21 NOTE — Patient Instructions (Signed)
Description   Hold your Coumadin tonight, tomorrow, and Wednesday, then start taking 1/2 tablet every day except 1 tablet on Tuesday and Saturday. Recheck INR in 1 week. Call our office if you have any problems or concerns [phone].

## 2019-11-22 ENCOUNTER — Ambulatory Visit (INDEPENDENT_AMBULATORY_CARE_PROVIDER_SITE_OTHER): Payer: Medicare Other | Admitting: Internal Medicine

## 2019-11-22 DIAGNOSIS — K253 Acute gastric ulcer without hemorrhage or perforation: Secondary | ICD-10-CM | POA: Diagnosis not present

## 2019-11-22 DIAGNOSIS — B9681 Helicobacter pylori [H. pylori] as the cause of diseases classified elsewhere: Secondary | ICD-10-CM

## 2019-11-22 DIAGNOSIS — I2510 Atherosclerotic heart disease of native coronary artery without angina pectoris: Secondary | ICD-10-CM

## 2019-11-22 NOTE — Progress Notes (Addendum)
[place] for Infectious Disease  Reason for Consult: Bleeding gastric ulcer and Helicobacter pylori infection Referring Provider: Dr. Wilfrid [name]  Assessment: She recently had a severe upper GI bleed from symptomatic gastric ulcer and Helicobacter pylori infection.  Her history of severe penicillin allergy and potential drug drug interactions between antibiotics, Coumadin and amiodarone make selection of a safe and effective Helicobacter regimen quite difficult.  I would not use amoxicillin because of her penicillin allergy.  I would not use clarithromycin because of potential prolonged QT syndrome with amiodarone.  I have discussed the situation with our ID pharmacist, [name] about the possibility of changing Coumadin to an alternate agent (question rivaroxaban or enoxaparin injections) temporarily while on Protonix, bismuth subsalicylate, doxycycline and levofloxacin for 2 weeks.  AddendumMagda [name] and I have reviewed the situation with [name] and the Coumadin clinic.  They recommended continuing her on Coumadin with very close follow-up while proceeding with Helicobacter treatment with PPI plus bismuth, doxycycline and levofloxacin for 2 weeks.  I called and spoke with her pharmacist at [facility] to review these plans.  I also spoke with [name] this afternoon.  She is in agreement with these plans.  Plan: 1. Continue pantoprazole 2. Start bismuth subsalicylate 2 tablets 3 times daily, doxycycline 100 mg twice daily and levofloxacin 500 mg daily for 2 weeks (she will not start these medications until she meets with [name]) 3. I will arrange follow-up with [name] next week and she will follow up with me in 6 weeks  Patient Active Problem List   Diagnosis Date Noted  . Gastric ulcer due to Helicobacter pylori 50/38/8828    Priority: High  . History of GI bleed 11/04/2019  . Symptomatic anemia 11/03/2019  . Chronic  diastolic CHF (congestive heart failure) ([place]) 10/02/2018  . AVM (arteriovenous malformation) of small bowel, acquired   . HTN (hypertension) 04/15/2015  . Hyperlipidemia 04/15/2015  . CKD (chronic kidney disease), stage III ([place]) 04/15/2015  . PAF (paroxysmal atrial fibrillation) ([place]) 04/15/2015  . Carotid stenosis 11/28/2013  . Encounter for therapeutic drug monitoring 11/01/2013  . Aortic valve disorder 07/12/2013  . Normocytic anemia 02/11/2013  . Chronic anticoagulation 02/11/2013  . H/O mechanical aortic valve replacement 02/11/2013  . Coronary Artery Disease 02/11/2013  . Type II diabetes mellitus with renal manifestations ([place]) 02/11/2013    Patient's Medications  New Prescriptions   No medications on file  Previous Medications   ACETAMINOPHEN (TYLENOL) 500 MG TABLET    Take 500 mg by mouth every 6 (six) hours as needed for mild pain or headache.   AMIODARONE (PACERONE) 200 MG TABLET    Take 1 tablet (200 mg total) by mouth daily.   ATORVASTATIN (LIPITOR) 80 MG TABLET    Take 1 tablet by mouth once daily   BEVACIZUMAB (AVASTIN) 1.25 MG/0.1 ML SOLN    Place 1.25 mg into the left eye every 8 (eight) weeks.   BISMUTH SUBSALICYLATE (PEPTO BISMOL) 262 MG CHEWABLE TABLET    Chew 2 tablets (524 mg total) by mouth 3 (three) times daily.   EZETIMIBE (ZETIA) 10 MG TABLET    TAKE 1 TABLET BY MOUTH ONCE DAILY PLEASE  MAKE  OVERDUE  APPOINTMENT  WITH  DR  Irish [name]  BEFORE  ANYMORE  REFILLS   FEFUM-FEPO-FA-B CMP-C-ZN-MN-CU (SE-TAN PLUS) 162-115.2-1 MG CAPS    Take 1 capsule by mouth 2 (two) times daily.    FOLIC ACID (FOLVITE) 003 MCG TABLET  Take 400 mcg by mouth every evening.    FUROSEMIDE (LASIX) 20 MG TABLET    Take 1 tablet (20 mg total) by mouth daily. You may take an extra 20 mg as needed for 2-3 lb weight gain overnight   ISOSORBIDE MONONITRATE (IMDUR) 60 MG 24 HR TABLET    Take 1 tablet (60 mg total) by mouth daily.   JANTOVEN 5 MG TABLET    TAKE 1/2 TO 1 (ONE-HALF TO ONE) TABLET  BY MOUTH ONCE DAILY AS  DIRECTED  BY  COUMADIN  CLINIC   JANUVIA 50 MG TABLET    Take 50 mg by mouth daily.    LOSARTAN (COZAAR) 50 MG TABLET    Take 50 mg by mouth daily.   MULTIPLE VITAMIN (MULTIVITAMIN WITH MINERALS) TABS    Take 1 tablet by mouth daily.   MULTIPLE VITAMINS-MINERALS (PRESERVISION AREDS 2 PO)    Take 1 tablet by mouth 2 (two) times daily.    NITROGLYCERIN (NITROSTAT) 0.4 MG SL TABLET    Place 1 tablet (0.4 mg total) under the tongue every 5 (five) minutes as needed for chest pain. Please make appt for January. 1st attempt   OMEGA-3 FATTY ACIDS (FISH OIL PO)    Take 1 capsule by mouth in the morning and at bedtime.   PANTOPRAZOLE (PROTONIX) 40 MG TABLET    Take 1 tablet (40 mg total) by mouth 2 (two) times daily before a meal.   POTASSIUM CHLORIDE (KLOR-CON) 10 MEQ TABLET    Take 10 mEq by mouth 2 (two) times daily.   TRAMADOL (ULTRAM) 50 MG TABLET    Take 50 mg by mouth every 6 (six) hours as needed for moderate pain.  Modified Medications   No medications on file  Discontinued Medications   No medications on file    HPI: [name] is a 86 y.o. female with multiple medical problems including diabetes, hypertension, dyslipidemia, coronary artery disease, heart failure, paroxysmal atrial fibrillation and prior aortic valve replacement.  She is on chronic Coumadin and amiodarone.  She has a history of recurrent GI bleeding and was admitted to the hospital in February with hypotension, melena and severe anemia.  She underwent EGD on 11/06/2019 which revealed a gastric ulcer.  Biopsy showed chronic active gastritis and the Warthin Starry stain was positive for Helicobacter pylori organisms.  She was discharged on Protonix.  The initial plan was to start her on bismuth subsalicylate, doxycycline and levofloxacin but her pharmacy became concerned about potential drug drug interactions with Coumadin and amiodarone.  She has history of severe head to toe peeling rash after taking  penicillin about 18 years ago while she was living in [place].  She does not know if she has ever been on ampicillin, amoxicillin or amoxicillin clavulanate.  She denies any further bleeding episodes.  She has not had any abdominal pain or acid reflux.  She was seen in Coumadin clinic yesterday and her INR was quite elevated at 5.4.  Her Coumadin was held.  Her main complaint is extreme fatigue, trouble sleeping and increased pain in her right thigh recently.  Review of Systems: Review of Systems  Constitutional: Positive for malaise/fatigue. Negative for fever and weight loss.  HENT: Negative for congestion and sore throat.   Respiratory: Negative for cough and shortness of breath.   Cardiovascular: Negative for chest pain.  Gastrointestinal: Positive for constipation. Negative for abdominal pain, blood in stool, diarrhea, heartburn, nausea and vomiting.  Genitourinary: Negative for dysuria.  Musculoskeletal:  Positive for joint pain and myalgias.  Skin: Negative for rash.  Psychiatric/Behavioral: Positive for memory loss. Negative for depression. The patient has insomnia. The patient is not nervous/anxious.       Past Medical History:  Diagnosis Date  . Anemia   . Anginal pain ([place])   . Arteriovenous malformation of gastrointestinal tract   . Arthritis    "fingers mostly" (04/12/2015)  . Carotid artery disease ([place])    Carotid US 3/17:  75-64% RICA; 33-29% LICA; Elevated bilateral subclavian artery velocities >>f/u 1 year. // Carotid US 4/18: R 40-59; L 1-39 >> FU 1 year // Carotid US 10/2018: R 40-59; L 1-39, L subclavian stenosis   . CHF (congestive heart failure) ([place])   . Chronic kidney disease (CKD), stage III (moderate)   . Chronic lower GI bleeding    "today; last time was ~ 8 yr ago; used to have them often before that too" (02/11/2013)  . Coronary artery disease   . DJD (degenerative joint disease)   . Dysrhythmia   . Heart murmur   . History of blood transfusion    "a few  times over the years; usually related to my Coumadin" (04/12/2015  . History of gout   . Hyperlipemia   . Hypertension   . Macula lutea degeneration   . Old MI (myocardial infarction)    "a coulple /dr in 02/2008; I never even knew I'd had them" (04/12/2015)  . Type II diabetes mellitus (H[facility])     Social History   Tobacco Use  . Smoking status: Never Smoker  . Smokeless tobacco: Never Used  Substance Use Topics  . Alcohol use: No  . Drug use: No    Family History  Problem Relation Age of Onset  . Heart disease Mother   . Heart disease Father    Allergies  Allergen Reactions  . Darvon [Propoxyphene] Nausea And Vomiting  . Lisinopril Cough  . Septra [Sulfamethoxazole-Trimethoprim] Other (See Comments)    Increased INR  . Warfarin And Related Other (See Comments)    ONLY TOLERATES BRAND  . Penicillins Rash    Did it involve swelling of the face/tongue/throat, SOB, or low BP? No Did it involve sudden or severe rash/hives, skin peeling, or any reaction on the inside of your mouth or nose? No Did you need to seek medical attention at a hospital or doctor's office? No When did it last happen?15 years If all above answers are "NO", may proceed with cephalosporin use.    OBJECTIVE: Vitals:   11/22/19 0954  Weight: 142 lb (64.4 kg)   Body mass index is 27.73 kg/m.   Physical Exam Constitutional:      Comments: She appears weak but is pleasant and in no distress.  She is accompanied by her husband.  Cardiovascular:     Rate and Rhythm: Normal rate and regular rhythm.     Heart sounds: No murmur.     Comments: Crisp, mechanical valve sounds. Pulmonary:     Effort: Pulmonary effort is normal.     Breath sounds: Normal breath sounds.  Abdominal:     Palpations: Abdomen is soft. There is no mass.     Tenderness: There is no abdominal tenderness.  Musculoskeletal:     Right lower leg: Edema present.     Left lower leg: Edema present.  Skin:    Findings: No rash.    Neurological:     General: No focal deficit present.  Psychiatric:  Mood and Affect: Mood normal.     Microbiology: No results found for this or any previous visit (from the past 240 hour(s)).  Michel Bickers, MD [facility] [place] [phone] pager   [phone] cell 11/22/2019, 10:23 AM

## 2019-11-24 ENCOUNTER — Telehealth: Payer: Self-pay

## 2019-11-24 ENCOUNTER — Telehealth: Payer: Self-pay | Admitting: Pharmacist

## 2019-11-24 MED ORDER — LEVOFLOXACIN 500 MG PO TABS: 500.0000 mg | ORAL_TABLET | Freq: Every day | ORAL | 0 refills | Status: DC

## 2019-11-24 MED ORDER — DOXYCYCLINE HYCLATE 100 MG PO TABS: 100.0000 mg | ORAL_TABLET | Freq: Two times a day (BID) | ORAL | 0 refills | Status: DC

## 2019-11-24 NOTE — Telephone Encounter (Signed)
Error Aundria Rud, CMA

## 2019-11-24 NOTE — Addendum Note (Signed)
Addended by: Michel Bickers on: 11/24/2019 10:59 AM   Modules accepted: Orders

## 2019-11-24 NOTE — Telephone Encounter (Signed)
Spoke with patients husband. Informed him to start giving patient 1/2 tablet of Jantoven per day and we will follow up on Monday to check INR. Will keep close eye on INR while on Levofloxacin and doxy for the next two week.

## 2019-11-24 NOTE — Telephone Encounter (Signed)
Received call from [place] with concern for drug to drug interaction between Doxy/ Leavquin and Jantoven/ Amiodarone. Would like to know if it is okay to fill prescriptions or if new prescription will need to be called in. Prescriptions pending on Md's decision.  [place]

## 2019-11-28 ENCOUNTER — Other Ambulatory Visit: Payer: Self-pay

## 2019-11-28 ENCOUNTER — Ambulatory Visit (INDEPENDENT_AMBULATORY_CARE_PROVIDER_SITE_OTHER): Payer: Medicare Other | Admitting: *Deleted

## 2019-11-28 ENCOUNTER — Ambulatory Visit (INDEPENDENT_AMBULATORY_CARE_PROVIDER_SITE_OTHER): Payer: Medicare Other | Admitting: Pharmacist

## 2019-11-28 DIAGNOSIS — I359 Nonrheumatic aortic valve disorder, unspecified: Secondary | ICD-10-CM | POA: Diagnosis not present

## 2019-11-28 DIAGNOSIS — Z7189 Other specified counseling: Secondary | ICD-10-CM | POA: Diagnosis not present

## 2019-11-28 DIAGNOSIS — B9681 Helicobacter pylori [H. pylori] as the cause of diseases classified elsewhere: Secondary | ICD-10-CM | POA: Diagnosis not present

## 2019-11-28 DIAGNOSIS — K253 Acute gastric ulcer without hemorrhage or perforation: Secondary | ICD-10-CM | POA: Diagnosis not present

## 2019-11-28 DIAGNOSIS — Z5181 Encounter for therapeutic drug level monitoring: Secondary | ICD-10-CM

## 2019-11-28 LAB — POCT INR: INR: 1.5 — AB (ref 2.0–3.0)

## 2019-11-28 NOTE — Progress Notes (Signed)
HPI: [name] is a 86 y.o. female who presents to the [facility] clinic today to follow up for new H.pylori drug regimen.   Patient Active Problem List   Diagnosis Date Noted  . Gastric ulcer due to Helicobacter pylori 93/71/6967  . History of GI bleed 11/04/2019  . Symptomatic anemia 11/03/2019  . Chronic diastolic CHF (congestive heart failure) ([place]) 10/02/2018  . AVM (arteriovenous malformation) of small bowel, acquired   . HTN (hypertension) 04/15/2015  . Hyperlipidemia 04/15/2015  . CKD (chronic kidney disease), stage III ([place]) 04/15/2015  . PAF (paroxysmal atrial fibrillation) ([place]) 04/15/2015  . Carotid stenosis 11/28/2013  . Encounter for therapeutic drug monitoring 11/01/2013  . Aortic valve disorder 07/12/2013  . Normocytic anemia 02/11/2013  . Chronic anticoagulation 02/11/2013  . H/O mechanical aortic valve replacement 02/11/2013  . Coronary Artery Disease 02/11/2013  . Type II diabetes mellitus with renal manifestations ([place]) 02/11/2013    Patient's Medications  New Prescriptions   No medications on file  Previous Medications   ACETAMINOPHEN (TYLENOL) 500 MG TABLET    Take 500 mg by mouth every 6 (six) hours as needed for mild pain or headache.   AMIODARONE (PACERONE) 200 MG TABLET    Take 1 tablet (200 mg total) by mouth daily.   ATORVASTATIN (LIPITOR) 80 MG TABLET    Take 1 tablet by mouth once daily   BEVACIZUMAB (AVASTIN) 1.25 MG/0.1 ML SOLN    Place 1.25 mg into the left eye every 8 (eight) weeks.   BISMUTH SUBSALICYLATE (PEPTO BISMOL) 262 MG CHEWABLE TABLET    Chew 2 tablets (524 mg total) by mouth 3 (three) times daily.   DOXYCYCLINE (VIBRA-TABS) 100 MG TABLET    Take 1 tablet (100 mg total) by mouth 2 (two) times daily.   EZETIMIBE (ZETIA) 10 MG TABLET    TAKE 1 TABLET BY MOUTH ONCE DAILY PLEASE  MAKE  OVERDUE  APPOINTMENT  WITH  DR  Irish Lack  BEFORE  ANYMORE  REFILLS   FEFUM-FEPO-FA-B CMP-C-ZN-MN-CU (SE-TAN PLUS) 162-115.2-1 MG CAPS    Take 1 capsule by  mouth 2 (two) times daily.    FOLIC ACID (FOLVITE) 893 MCG TABLET    Take 400 mcg by mouth every evening.    FUROSEMIDE (LASIX) 20 MG TABLET    Take 1 tablet (20 mg total) by mouth daily. You may take an extra 20 mg as needed for 2-3 lb weight gain overnight   ISOSORBIDE MONONITRATE (IMDUR) 60 MG 24 HR TABLET    Take 1 tablet (60 mg total) by mouth daily.   JANTOVEN 5 MG TABLET    TAKE 1/2 TO 1 (ONE-HALF TO ONE) TABLET BY MOUTH ONCE DAILY AS  DIRECTED  BY  COUMADIN  CLINIC   JANUVIA 50 MG TABLET    Take 50 mg by mouth daily.    LEVOFLOXACIN (LEVAQUIN) 500 MG TABLET    Take 1 tablet (500 mg total) by mouth daily.   LOSARTAN (COZAAR) 50 MG TABLET    Take 50 mg by mouth daily.   MULTIPLE VITAMIN (MULTIVITAMIN WITH MINERALS) TABS    Take 1 tablet by mouth daily.   MULTIPLE VITAMINS-MINERALS (PRESERVISION AREDS 2 PO)    Take 1 tablet by mouth 2 (two) times daily.    NITROGLYCERIN (NITROSTAT) 0.4 MG SL TABLET    Place 1 tablet (0.4 mg total) under the tongue every 5 (five) minutes as needed for chest pain. Please make appt for January. 1st attempt   OMEGA-3 FATTY ACIDS (  FISH OIL PO)    Take 1 capsule by mouth in the morning and at bedtime.   PANTOPRAZOLE (PROTONIX) 40 MG TABLET    Take 1 tablet (40 mg total) by mouth 2 (two) times daily before a meal.   POTASSIUM CHLORIDE (KLOR-CON) 10 MEQ TABLET    Take 10 mEq by mouth 2 (two) times daily.   TRAMADOL (ULTRAM) 50 MG TABLET    Take 50 mg by mouth every 6 (six) hours as needed for moderate pain.  Modified Medications   No medications on file  Discontinued Medications   No medications on file    Allergies: Allergies  Allergen Reactions  . Darvon [Propoxyphene] Nausea And Vomiting  . Lisinopril Cough  . Septra [Sulfamethoxazole-Trimethoprim] Other (See Comments)    Increased INR  . Warfarin And Related Other (See Comments)    ONLY TOLERATES BRAND  . Penicillins Rash    Did it involve swelling of the face/tongue/throat, SOB, or low BP? No Did it  involve sudden or severe rash/hives, skin peeling, or any reaction on the inside of your mouth or nose? No Did you need to seek medical attention at a hospital or doctor's office? No When did it last happen?15 years If all above answers are "NO", may proceed with cephalosporin use.    Past Medical History: Past Medical History:  Diagnosis Date  . Anemia   . Anginal pain ([place])   . Arteriovenous malformation of gastrointestinal tract   . Arthritis    "fingers mostly" (04/12/2015)  . Carotid artery disease ([place])    Carotid US 3/17:  74-08% RICA; 14-48% LICA; Elevated bilateral subclavian artery velocities >>f/u 1 year. // Carotid US 4/18: R 40-59; L 1-39 >> FU 1 year // Carotid US 10/2018: R 40-59; L 1-39, L subclavian stenosis   . CHF (congestive heart failure) ([place])   . Chronic kidney disease (CKD), stage III (moderate)   . Chronic lower GI bleeding    "today; last time was ~ 8 yr ago; used to have them often before that too" (02/11/2013)  . Coronary artery disease   . DJD (degenerative joint disease)   . Dysrhythmia   . Heart murmur   . History of blood transfusion    "a few times over the years; usually related to my Coumadin" (04/12/2015  . History of gout   . Hyperlipemia   . Hypertension   . Macula lutea degeneration   . Old MI (myocardial infarction)    "a coulple /dr in 02/2008; I never even knew I'd had them" (04/12/2015)  . Type II diabetes mellitus ([place])     Social History: Social History   Socioeconomic History  . Marital status: Married    Spouse name: Not on file  . Number of children: Not on file  . Years of education: Not on file  . Highest education level: Not on file  Occupational History  . Not on file  Tobacco Use  . Smoking status: Never Smoker  . Smokeless tobacco: Never Used  Substance and Sexual Activity  . Alcohol use: No  . Drug use: No  . Sexual activity: Yes  Other Topics Concern  . Not on file  Social History Narrative  . Not on file     Social Determinants of Health   Financial Resource Strain:   . Difficulty of Paying Living Expenses: Not on file  Food Insecurity:   . Worried About Charity fundraiser in the Last Year: Not on file  .  Ran Out of Food in the Last Year: Not on file  Transportation Needs:   . Lack of Transportation (Medical): Not on file  . Lack of Transportation (Non-Medical): Not on file  Physical Activity:   . Days of Exercise per Week: Not on file  . Minutes of Exercise per Session: Not on file  Stress:   . Feeling of Stress : Not on file  Social Connections:   . Frequency of Communication with Friends and Family: Not on file  . Frequency of Social Gatherings with Friends and Family: Not on file  . Attends Religious Services: Not on file  . Active Member of Clubs or Organizations: Not on file  . Attends Archivist Meetings: Not on file  . Marital Status: Not on file    Labs: INR 11/28/2019  1.5 11/21/2019  5.5 11/11/2019  2.2  Lipids: Lab Results  Component Value Date   CHOL 117 06/29/2019   TRIG 153 (H) 06/29/2019   HDL 51 06/29/2019   CHOLHDL 2.3 06/29/2019   LDLCALC 40 06/29/2019     Assessment: [name], accompanied by her husband, came in today to follow up on a new medication regimen for H.pylori in combination with warfarin and amiodarone. The patient reports feeling very weak, poor appetite, and trouble sleeping at night. Right leg pain and swelling are also a concern.   The husband had to help [name] walk and answered most of the questions as they reported issues with short term memory loss d/t lack of sleep.  The providers main concern is the drug interaction with antibiotics, warfarin, and amiodarone. All of which can increase the INR. [name] has a history of GI bleeding. [facility] is currently working to stabilize the INR dose as values have been 2.2>5.5>1.5 with a goal of 2-3  The husbands main concern is the leg pain and swelling. He reported that an ultra sound was  done to rule a DVT.   Plan: 1)Start 14 days of H.Pylori regimen: -Bismuth subsalicylate 524mg  TID -Doxycycline 100 mg twice daily -Levofloxacin 500 mg daily 2) Continue Protonix 40mg  BID 3) Routine INR checks as decided by [facility] 4) F/u 6 weeks  A dosing calendar was provided to the patient giving the patient specific times and notes for the 14 day H.Pylori regimen. The patient was told to call with any questions or bleeding concerns. All current questions were answered.  Ned Clines Student Pharmacist, Class of [place] for Infectious Disease 11/28/2019, 3:13 PM

## 2019-11-28 NOTE — Patient Instructions (Signed)
Description   Take 1 tablet today, then 1/2 tablet daily while on antibiotics (doxycycline and Levaquin). Recheck INR in 1 week. Call our office if you have any problems or concerns [phone].

## 2019-11-29 ENCOUNTER — Inpatient Hospital Stay (HOSPITAL_COMMUNITY)
Admission: EM | Admit: 2019-11-29 | Discharge: 2019-12-03 | DRG: 378 | Disposition: A | Discharge status: Home / Self Care | Payer: Medicare Other | Attending: Internal Medicine | Admitting: Internal Medicine

## 2019-11-29 ENCOUNTER — Encounter (HOSPITAL_COMMUNITY): Payer: Self-pay

## 2019-11-29 DIAGNOSIS — K2951 Unspecified chronic gastritis with bleeding: Secondary | ICD-10-CM | POA: Diagnosis not present

## 2019-11-29 DIAGNOSIS — Z87440 Personal history of urinary (tract) infections: Secondary | ICD-10-CM

## 2019-11-29 DIAGNOSIS — I359 Nonrheumatic aortic valve disorder, unspecified: Secondary | ICD-10-CM | POA: Diagnosis not present

## 2019-11-29 DIAGNOSIS — M25571 Pain in right ankle and joints of right foot: Secondary | ICD-10-CM | POA: Diagnosis not present

## 2019-11-29 DIAGNOSIS — Z955 Presence of coronary angioplasty implant and graft: Secondary | ICD-10-CM | POA: Diagnosis not present

## 2019-11-29 DIAGNOSIS — Z8249 Family history of ischemic heart disease and other diseases of the circulatory system: Secondary | ICD-10-CM

## 2019-11-29 DIAGNOSIS — I252 Old myocardial infarction: Secondary | ICD-10-CM | POA: Diagnosis not present

## 2019-11-29 DIAGNOSIS — R252 Cramp and spasm: Secondary | ICD-10-CM | POA: Diagnosis not present

## 2019-11-29 DIAGNOSIS — Z882 Allergy status to sulfonamides status: Secondary | ICD-10-CM

## 2019-11-29 DIAGNOSIS — Z7901 Long term (current) use of anticoagulants: Secondary | ICD-10-CM | POA: Diagnosis not present

## 2019-11-29 DIAGNOSIS — Z951 Presence of aortocoronary bypass graft: Secondary | ICD-10-CM

## 2019-11-29 DIAGNOSIS — Z9049 Acquired absence of other specified parts of digestive tract: Secondary | ICD-10-CM | POA: Diagnosis not present

## 2019-11-29 DIAGNOSIS — I5032 Chronic diastolic (congestive) heart failure: Secondary | ICD-10-CM | POA: Diagnosis present

## 2019-11-29 DIAGNOSIS — K922 Gastrointestinal hemorrhage, unspecified: Secondary | ICD-10-CM

## 2019-11-29 DIAGNOSIS — Z5181 Encounter for therapeutic drug level monitoring: Secondary | ICD-10-CM | POA: Diagnosis not present

## 2019-11-29 DIAGNOSIS — I10 Essential (primary) hypertension: Secondary | ICD-10-CM | POA: Diagnosis present

## 2019-11-29 DIAGNOSIS — N179 Acute kidney failure, unspecified: Secondary | ICD-10-CM | POA: Diagnosis not present

## 2019-11-29 DIAGNOSIS — Z20822 Contact with and (suspected) exposure to covid-19: Secondary | ICD-10-CM | POA: Diagnosis not present

## 2019-11-29 DIAGNOSIS — R001 Bradycardia, unspecified: Secondary | ICD-10-CM | POA: Diagnosis not present

## 2019-11-29 DIAGNOSIS — D62 Acute posthemorrhagic anemia: Secondary | ICD-10-CM | POA: Diagnosis present

## 2019-11-29 DIAGNOSIS — Z79899 Other long term (current) drug therapy: Secondary | ICD-10-CM

## 2019-11-29 DIAGNOSIS — I2510 Atherosclerotic heart disease of native coronary artery without angina pectoris: Secondary | ICD-10-CM | POA: Diagnosis present

## 2019-11-29 DIAGNOSIS — B9681 Helicobacter pylori [H. pylori] as the cause of diseases classified elsewhere: Secondary | ICD-10-CM | POA: Diagnosis not present

## 2019-11-29 DIAGNOSIS — K254 Chronic or unspecified gastric ulcer with hemorrhage: Secondary | ICD-10-CM | POA: Diagnosis present

## 2019-11-29 DIAGNOSIS — R609 Edema, unspecified: Secondary | ICD-10-CM | POA: Diagnosis not present

## 2019-11-29 DIAGNOSIS — G8929 Other chronic pain: Secondary | ICD-10-CM | POA: Diagnosis present

## 2019-11-29 DIAGNOSIS — Z8719 Personal history of other diseases of the digestive system: Secondary | ICD-10-CM

## 2019-11-29 DIAGNOSIS — E785 Hyperlipidemia, unspecified: Secondary | ICD-10-CM | POA: Diagnosis present

## 2019-11-29 DIAGNOSIS — I959 Hypotension, unspecified: Secondary | ICD-10-CM | POA: Diagnosis not present

## 2019-11-29 DIAGNOSIS — M79604 Pain in right leg: Secondary | ICD-10-CM | POA: Diagnosis not present

## 2019-11-29 DIAGNOSIS — Z888 Allergy status to other drugs, medicaments and biological substances status: Secondary | ICD-10-CM

## 2019-11-29 DIAGNOSIS — N184 Chronic kidney disease, stage 4 (severe): Secondary | ICD-10-CM | POA: Diagnosis present

## 2019-11-29 DIAGNOSIS — Z79891 Long term (current) use of opiate analgesic: Secondary | ICD-10-CM

## 2019-11-29 DIAGNOSIS — M1711 Unilateral primary osteoarthritis, right knee: Secondary | ICD-10-CM | POA: Diagnosis not present

## 2019-11-29 DIAGNOSIS — I480 Paroxysmal atrial fibrillation: Secondary | ICD-10-CM | POA: Diagnosis present

## 2019-11-29 DIAGNOSIS — I130 Hypertensive heart and chronic kidney disease with heart failure and stage 1 through stage 4 chronic kidney disease, or unspecified chronic kidney disease: Secondary | ICD-10-CM | POA: Diagnosis not present

## 2019-11-29 DIAGNOSIS — K259 Gastric ulcer, unspecified as acute or chronic, without hemorrhage or perforation: Secondary | ICD-10-CM | POA: Diagnosis not present

## 2019-11-29 DIAGNOSIS — Z8619 Personal history of other infectious and parasitic diseases: Secondary | ICD-10-CM | POA: Diagnosis not present

## 2019-11-29 DIAGNOSIS — I248 Other forms of acute ischemic heart disease: Secondary | ICD-10-CM | POA: Diagnosis present

## 2019-11-29 DIAGNOSIS — I25119 Atherosclerotic heart disease of native coronary artery with unspecified angina pectoris: Secondary | ICD-10-CM | POA: Diagnosis not present

## 2019-11-29 DIAGNOSIS — I110 Hypertensive heart disease with heart failure: Secondary | ICD-10-CM | POA: Diagnosis not present

## 2019-11-29 DIAGNOSIS — E1129 Type 2 diabetes mellitus with other diabetic kidney complication: Secondary | ICD-10-CM | POA: Diagnosis present

## 2019-11-29 DIAGNOSIS — I454 Nonspecific intraventricular block: Secondary | ICD-10-CM | POA: Diagnosis present

## 2019-11-29 DIAGNOSIS — Z952 Presence of prosthetic heart valve: Secondary | ICD-10-CM | POA: Diagnosis not present

## 2019-11-29 DIAGNOSIS — K921 Melena: Secondary | ICD-10-CM | POA: Diagnosis not present

## 2019-11-29 DIAGNOSIS — Z03818 Encounter for observation for suspected exposure to other biological agents ruled out: Secondary | ICD-10-CM | POA: Diagnosis not present

## 2019-11-29 DIAGNOSIS — R52 Pain, unspecified: Secondary | ICD-10-CM

## 2019-11-29 DIAGNOSIS — R600 Localized edema: Secondary | ICD-10-CM | POA: Diagnosis not present

## 2019-11-29 DIAGNOSIS — E1122 Type 2 diabetes mellitus with diabetic chronic kidney disease: Secondary | ICD-10-CM | POA: Diagnosis present

## 2019-11-29 DIAGNOSIS — I509 Heart failure, unspecified: Secondary | ICD-10-CM | POA: Diagnosis not present

## 2019-11-29 DIAGNOSIS — Z885 Allergy status to narcotic agent status: Secondary | ICD-10-CM

## 2019-11-29 DIAGNOSIS — Z880 Allergy status to penicillin: Secondary | ICD-10-CM

## 2019-11-29 DIAGNOSIS — Z7984 Long term (current) use of oral hypoglycemic drugs: Secondary | ICD-10-CM

## 2019-11-29 LAB — BASIC METABOLIC PANEL
Anion gap: 16 — ABNORMAL HIGH (ref 5–15)
BUN: 57 mg/dL — ABNORMAL HIGH (ref 8–23)
CO2: 17 mmol/L — ABNORMAL LOW (ref 22–32)
Calcium: 8.3 mg/dL — ABNORMAL LOW (ref 8.9–10.3)
Chloride: 102 mmol/L (ref 98–111)
Creatinine, Ser: 3.22 mg/dL — ABNORMAL HIGH (ref 0.44–1.00)
GFR calc Af Amer: 15 mL/min — ABNORMAL LOW (ref >60)
GFR calc non Af Amer: 13 mL/min — ABNORMAL LOW (ref >60)
Glucose, Bld: 171 mg/dL — ABNORMAL HIGH (ref 70–99)
Potassium: 5.1 mmol/L (ref 3.5–5.1)
Sodium: 135 mmol/L (ref 135–145)

## 2019-11-29 LAB — CBC
HCT: 20.9 % — ABNORMAL LOW (ref 36.0–46.0)
HCT: 31.9 % — ABNORMAL LOW (ref 36.0–46.0)
Hemoglobin: 5.9 g/dL — CL (ref 12.0–15.0)
Hemoglobin: 9.5 g/dL — ABNORMAL LOW (ref 12.0–15.0)
MCH: 31.9 pg (ref 26.0–34.0)
MCH: 30.8 pg (ref 26.0–34.0)
MCHC: 28.2 g/dL — ABNORMAL LOW (ref 30.0–36.0)
MCHC: 29.8 g/dL — ABNORMAL LOW (ref 30.0–36.0)
MCV: 113 fL — ABNORMAL HIGH (ref 80.0–100.0)
MCV: 103.6 fL — ABNORMAL HIGH (ref 80.0–100.0)
Platelets: 268 10*3/uL (ref 150–400)
Platelets: 213 10*3/uL (ref 150–400)
RBC: 1.85 MIL/uL — ABNORMAL LOW (ref 3.87–5.11)
RBC: 3.08 MIL/uL — ABNORMAL LOW (ref 3.87–5.11)
RDW: 17.2 % — ABNORMAL HIGH (ref 11.5–15.5)
RDW: 22.3 % — ABNORMAL HIGH (ref 11.5–15.5)
WBC: 6.6 10*3/uL (ref 4.0–10.5)
WBC: 8.1 10*3/uL (ref 4.0–10.5)
nRBC: 1.2 % — ABNORMAL HIGH (ref 0.0–0.2)
nRBC: 0.5 % — ABNORMAL HIGH (ref 0.0–0.2)

## 2019-11-29 LAB — PREPARE RBC (CROSSMATCH)

## 2019-11-29 LAB — SARS CORONAVIRUS 2 (TAT 6-24 HRS): SARS Coronavirus 2: NEGATIVE

## 2019-11-29 LAB — CBG MONITORING, ED
Glucose-Capillary: 147 mg/dL — ABNORMAL HIGH (ref 70–99)
Glucose-Capillary: 128 mg/dL — ABNORMAL HIGH (ref 70–99)
Glucose-Capillary: 110 mg/dL — ABNORMAL HIGH (ref 70–99)

## 2019-11-29 LAB — GLUCOSE, CAPILLARY
Glucose-Capillary: 90 mg/dL (ref 70–99)
Glucose-Capillary: 131 mg/dL — ABNORMAL HIGH (ref 70–99)

## 2019-11-29 LAB — HEMOGLOBIN A1C
Hgb A1c MFr Bld: 3.8 % — ABNORMAL LOW (ref 4.8–5.6)
Mean Plasma Glucose: 62.36 mg/dL

## 2019-11-29 LAB — POC OCCULT BLOOD, ED: Fecal Occult Bld: POSITIVE — AB

## 2019-11-29 LAB — PROTIME-INR
INR: 2.1 — ABNORMAL HIGH (ref 0.8–1.2)
Prothrombin Time: 23.2 seconds — ABNORMAL HIGH (ref 11.4–15.2)

## 2019-11-29 MED ORDER — ACETAMINOPHEN 325 MG PO TABS: 650.0000 mg | ORAL_TABLET | Freq: Four times a day (QID) | ORAL | Status: DC | PRN

## 2019-11-29 MED ORDER — LORAZEPAM 2 MG/ML IJ SOLN
1.0000 mg | INTRAMUSCULAR | Status: DC | PRN
  Administered 2019-11-29 – 2019-12-02 (×5): 1 mg via INTRAVENOUS
  Filled 2019-11-29 (×5): qty 1

## 2019-11-29 MED ORDER — DOXYCYCLINE HYCLATE 100 MG PO TABS
100.0000 mg | ORAL_TABLET | Freq: Two times a day (BID) | ORAL | Status: DC
  Administered 2019-11-29 – 2019-12-03 (×9): 100 mg via ORAL
  Filled 2019-11-29 (×9): qty 1

## 2019-11-29 MED ORDER — ONDANSETRON HCL 4 MG/2ML IJ SOLN: 4.0000 mg | Freq: Four times a day (QID) | INTRAMUSCULAR | Status: DC | PRN

## 2019-11-29 MED ORDER — ONDANSETRON HCL 4 MG PO TABS: 4.0000 mg | ORAL_TABLET | Freq: Four times a day (QID) | ORAL | Status: DC | PRN

## 2019-11-29 MED ORDER — LEVOFLOXACIN 500 MG PO TABS
500.0000 mg | ORAL_TABLET | ORAL | Status: DC
  Administered 2019-11-29 – 2019-12-03 (×3): 500 mg via ORAL
  Filled 2019-11-29 (×4): qty 1

## 2019-11-29 MED ORDER — SODIUM CHLORIDE 0.9% FLUSH: 3.0000 mL | Freq: Once | INTRAVENOUS | Status: DC

## 2019-11-29 MED ORDER — AMIODARONE HCL 200 MG PO TABS
200.0000 mg | ORAL_TABLET | Freq: Every day | ORAL | Status: DC
  Administered 2019-11-29 – 2019-12-03 (×5): 200 mg via ORAL
  Filled 2019-11-29 (×5): qty 1

## 2019-11-29 MED ORDER — BISMUTH SUBSALICYLATE 262 MG PO CHEW
524.0000 mg | CHEWABLE_TABLET | Freq: Three times a day (TID) | ORAL | Status: DC
  Administered 2019-11-29 – 2019-12-03 (×11): 524 mg via ORAL
  Filled 2019-11-29 (×15): qty 2

## 2019-11-29 MED ORDER — FOLIC ACID 1 MG PO TABS
1.0000 mg | ORAL_TABLET | Freq: Every evening | ORAL | Status: DC
  Administered 2019-11-29 – 2019-12-02 (×4): 1 mg via ORAL
  Filled 2019-11-29 (×4): qty 1

## 2019-11-29 MED ORDER — SODIUM CHLORIDE 0.9 % IV BOLUS
1000.0000 mL | Freq: Once | INTRAVENOUS | Status: AC
  Administered 2019-11-29: 1000 mL via INTRAVENOUS

## 2019-11-29 MED ORDER — INSULIN ASPART 100 UNIT/ML ~~LOC~~ SOLN
0.0000 [IU] | Freq: Three times a day (TID) | SUBCUTANEOUS | Status: DC
  Administered 2019-11-30 – 2019-12-02 (×4): 1 [IU] via SUBCUTANEOUS

## 2019-11-29 MED ORDER — LACTATED RINGERS IV SOLN: INTRAVENOUS | Status: DC

## 2019-11-29 MED ORDER — ATORVASTATIN CALCIUM 80 MG PO TABS
80.0000 mg | ORAL_TABLET | Freq: Every day | ORAL | Status: DC
  Administered 2019-11-29 – 2019-12-03 (×5): 80 mg via ORAL
  Filled 2019-11-29 (×5): qty 1

## 2019-11-29 MED ORDER — SODIUM CHLORIDE 0.9 % IV SOLN
10.0000 mL/h | Freq: Once | INTRAVENOUS | Status: AC
  Administered 2019-11-29: 10 mL/h via INTRAVENOUS

## 2019-11-29 MED ORDER — SODIUM CHLORIDE 0.9% FLUSH
3.0000 mL | Freq: Two times a day (BID) | INTRAVENOUS | Status: DC
  Administered 2019-11-29 – 2019-12-03 (×8): 3 mL via INTRAVENOUS

## 2019-11-29 MED ORDER — ACETAMINOPHEN 650 MG RE SUPP: 650.0000 mg | Freq: Four times a day (QID) | RECTAL | Status: DC | PRN

## 2019-11-29 MED ORDER — EZETIMIBE 10 MG PO TABS
10.0000 mg | ORAL_TABLET | Freq: Every day | ORAL | Status: DC
  Administered 2019-11-29 – 2019-12-03 (×5): 10 mg via ORAL
  Filled 2019-11-29 (×5): qty 1

## 2019-11-29 MED ORDER — TRAMADOL HCL 50 MG PO TABS
50.0000 mg | ORAL_TABLET | Freq: Four times a day (QID) | ORAL | Status: DC | PRN
  Administered 2019-11-29 (×2): 50 mg via ORAL
  Filled 2019-11-29 (×2): qty 1

## 2019-11-29 MED ORDER — ISOSORBIDE MONONITRATE ER 60 MG PO TB24
120.0000 mg | ORAL_TABLET | Freq: Every day | ORAL | Status: DC
  Administered 2019-11-29 – 2019-11-30 (×2): 120 mg via ORAL
  Filled 2019-11-29: qty 4
  Filled 2019-11-29: qty 2

## 2019-11-29 MED ORDER — PANTOPRAZOLE SODIUM 40 MG IV SOLR
40.0000 mg | Freq: Two times a day (BID) | INTRAVENOUS | Status: DC
  Administered 2019-11-29 – 2019-12-01 (×5): 40 mg via INTRAVENOUS
  Filled 2019-11-29 (×5): qty 40

## 2019-11-29 NOTE — H&P (Signed)
History and Physical    [name] HMC:[id number] DOB: 01-12-1933 DOA: 11/29/2019  PCP: Gaynelle Arabian, MD Consultants:  Megan Salon - ID; Irish Lack - cardiology; Bond - pulmonology Patient coming from:  Home - lives with husband; NOK: Husband, Luane Rochon, [phone]  Chief Complaint: Melena  HPI: [name] is a 86 y.o. female with medical history significant of afib/AVR on Coumadin; stage 3 CKD; chronic diastolic CHF; CAD; HTN; and HLD presenting with melena.  She was last admitted from 2/11-17 for ABLA associated with GI bleeding.  She was transfused 2 units. 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  She was found to have H. Pylori which is also being treated.  Since her last admission, she lost control of her bowels last night.  She denies bleeding but had "feces everywhere" with brown stool.  She denies feeling lightheaded or dizzy.  She had a couple of days of very dark stools but she did not see a red tinge.  No abdominal pain (now or on prior admission).  Mildly SOB, has had heart problems for a couple of years and unchanged from baseline.  She does not wear home O2.   ED Course:  Carryover, per Dr. Myna Hidalgo:  She is being treated for [facility] pyori after recent admission with UGIB with gastric ulcer and H pylori. She is on coumadin for mechanical valve and presents with diarrhea. She denies abdominal pain, hematemesis, or frank blood in stool.   She reports recent melena, is found to have Hgb of 5.9 (from 7.3 on 11/14/19), AKI on CKD IV, INR 1.5, and is FOBT+.   She has 2 units RBC ordered.   MAP has remained <65. Does not appear that any IVF has been ordered in ED. She will be given a liter NS bolus and then reassessed. If BP improves with saline and blood, she can be admitted to the stepdown unit.   Review of  Systems: As per HPI; otherwise review of systems reviewed and negative.   Ambulatory Status:  Ambulates without assistance  Past Medical History:  Diagnosis Date  . Anemia   . Anginal pain ([place])   . Arteriovenous malformation of gastrointestinal tract   . Arthritis    "fingers mostly" (04/12/2015)  . Carotid artery disease ([place])    Carotid US 3/17:  50-35% RICA; 46-56% LICA; Elevated bilateral subclavian artery velocities >>f/u 1 year. // Carotid US 4/18: R 40-59; L 1-39 >> FU 1 year // Carotid US 10/2018: R 40-59; L 1-39, L subclavian stenosis   . CHF (congestive heart failure) ([place])   . Chronic kidney disease (CKD), stage III (moderate)   . Chronic lower GI bleeding    "today; last time was ~ 8 yr ago; used to have them often before that too" (02/11/2013)  . Coronary artery disease   . DJD (degenerative joint disease)   . Dysrhythmia   . Heart murmur   . History of blood transfusion    "a few times over the years; usually related to my Coumadin" (04/12/2015  . History of gout   . Hyperlipemia   . Hypertension   . Macula lutea degeneration   . Old MI (myocardial infarction)    "a coulple /dr in 02/2008; I never even knew I'd had them" (04/12/2015)  . Type II diabetes mellitus (HCC)     Past  Surgical History:  Procedure Laterality Date  . AORTIC VALVE REPLACEMENT  1993  . APPENDECTOMY  1953  . BIOPSY  11/06/2019   Procedure: BIOPSY;  Surgeon: Doran Stabler, MD;  Location: [place];  Service: Gastroenterology;;  . CARDIAC CATHETERIZATION  [phone]  . CARDIAC CATHETERIZATION  04/12/2015   Procedure: Coronary Stent Intervention;  Surgeon: Jettie Booze, MD;    SYNERGY DES 3.5X16 to the ostial RCA   . CARDIAC CATHETERIZATION  04/12/2015   Procedure: Coronary/Graft Angiography;  Surgeon: Eloy End, MD; LAD & CFX 100%, patent LIMA-LAD, SVG-D1; SVG-OM-PDA first limb 100%, 2nd limb patent; oRCA 75%>0 w/ stent  . [place]   St. Jude/notes 10/29/2003  (02/11/2013)  . CARDIOVERSION N/A 08/15/2019   Procedure: CARDIOVERSION;  Surgeon: Acie Fredrickson Wonda Cheng, MD;  Location: [facility] ENDOSCOPY;  Service: Cardiovascular;  Laterality: N/A;  . COLONOSCOPY N/A 02/13/2013   Procedure: COLONOSCOPY;  Surgeon: Lafayette Dragon, MD;  Location: [facility] ENDOSCOPY;  Service: Endoscopy;  Laterality: N/A;  . CORONARY ANGIOPLASTY WITH STENT PLACEMENT  2009+   "3 at least; put in 1 stent at a time" (02/11/2013)  . CORONARY ARTERY BYPASS GRAFT  1990   LIMA-LAD, SVG-OM-PDA, SVG-D1  . DILATION AND CURETTAGE OF UTERUS  1960's   'after a miscarriage" (02/11/2013)  . ENTEROSCOPY N/A 02/13/2013   Procedure: ENTEROSCOPY;  Surgeon: Lafayette Dragon, MD;  Location: [facility] ENDOSCOPY;  Service: Endoscopy;  Laterality: N/A;  . ENTEROSCOPY N/A 11/06/2019   Procedure: ENTEROSCOPY;  Surgeon: Doran Stabler, MD;  Location: [facility] ENDOSCOPY;  Service: Gastroenterology;  Laterality: N/A;  . EXPLORATORY LAPAROTOMY  02/24/2008   which revealed a retroperitoneal hematoma and bleeding from the right external iliac artery/notes 03/02/2008 (02/11/2013)   . FLEXIBLE SIGMOIDOSCOPY N/A 04/21/2015   Procedure: FLEXIBLE SIGMOIDOSCOPY;  Surgeon: Ladene Artist, MD;  Location: [facility] ENDOSCOPY;  Service: Endoscopy;  Laterality: N/A;  . HOT HEMOSTASIS N/A 11/06/2019   Procedure: HOT HEMOSTASIS (ARGON PLASMA COAGULATION/BICAP);  Surgeon: Doran Stabler, MD;  Location: [place];  Service: Gastroenterology;  Laterality: N/A;  . TEE WITHOUT CARDIOVERSION N/A 12/01/2018   Procedure: TRANSESOPHAGEAL ECHOCARDIOGRAM (TEE);  Surgeon: Jerline Pain, MD;  Location: [facility] ENDOSCOPY;  Service: Cardiovascular;  Laterality: N/A;    Social History   Socioeconomic History  . Marital status: Married    Spouse name: Not on file  . Number of children: Not on file  . Years of education: Not on file  . Highest education level: Not on file  Occupational History  . Not on file  Tobacco Use  . Smoking status: Never Smoker  . Smokeless  tobacco: Never Used  Substance and Sexual Activity  . Alcohol use: No  . Drug use: No  . Sexual activity: Yes  Other Topics Concern  . Not on file  Social History Narrative  . Not on file   Social Determinants of Health   Financial Resource Strain:   . Difficulty of Paying Living Expenses: Not on file  Food Insecurity:   . Worried About Charity fundraiser in the Last Year: Not on file  . Ran Out of Food in the Last Year: Not on file  Transportation Needs:   . Lack of Transportation (Medical): Not on file  . Lack of Transportation (Non-Medical): Not on file  Physical Activity:   . Days of Exercise per Week: Not on file  . Minutes of Exercise per Session: Not on file  Stress:   .  Feeling of Stress : Not on file  Social Connections:   . Frequency of Communication with Friends and Family: Not on file  . Frequency of Social Gatherings with Friends and Family: Not on file  . Attends Religious Services: Not on file  . Active Member of Clubs or Organizations: Not on file  . Attends Archivist Meetings: Not on file  . Marital Status: Not on file  Intimate Partner Violence:   . Fear of Current or Ex-Partner: Not on file  . Emotionally Abused: Not on file  . Physically Abused: Not on file  . Sexually Abused: Not on file    Allergies  Allergen Reactions  . Darvon [Propoxyphene] Nausea And Vomiting  . Lisinopril Cough  . Septra [Sulfamethoxazole-Trimethoprim] Other (See Comments)    Increased INR  . Warfarin And Related Other (See Comments)    ONLY TOLERATES BRAND  . Penicillins Rash    Did it involve swelling of the face/tongue/throat, SOB, or low BP? No Did it involve sudden or severe rash/hives, skin peeling, or any reaction on the inside of your mouth or nose? No Did you need to seek medical attention at a hospital or doctor's office? No When did it last happen?15 years If all above answers are "NO", may proceed with cephalosporin use.    Family History    Problem Relation Age of Onset  . Heart disease Mother   . Heart disease Father     Prior to Admission medications   Medication Sig Start Date End Date Taking? Authorizing Provider  acetaminophen (TYLENOL) 500 MG tablet Take 500 mg by mouth every 6 (six) hours as needed for mild pain or headache.    [provider]  amiodarone (PACERONE) 200 MG tablet Take 1 tablet (200 mg total) by mouth daily. 09/06/19   Imogene Burn, PA-C  atorvastatin (LIPITOR) 80 MG tablet Take 1 tablet by mouth once daily 09/19/19   Jettie Booze, MD  bevacizumab (AVASTIN) 1.25 mg/0.1 mL SOLN Place 1.25 mg into the left eye every 8 (eight) weeks.    [provider]  bismuth subsalicylate (PEPTO BISMOL) 262 MG chewable tablet Chew 2 tablets (524 mg total) by mouth 3 (three) times daily. Patient not taking: Reported on 11/22/2019 11/11/19   Doran Stabler, MD  doxycycline (VIBRA-TABS) 100 MG tablet Take 1 tablet (100 mg total) by mouth 2 (two) times daily. 11/24/19   Michel Bickers, MD  ezetimibe (ZETIA) 10 MG tablet TAKE 1 TABLET BY MOUTH ONCE DAILY PLEASE  MAKE  OVERDUE  APPOINTMENT  WITH  DR  [place]  ANYMORE  REFILLS 07/06/19   Jettie Booze, MD  FeFum-FePo-FA-B Cmp-C-Zn-Mn-Cu (SE-TAN PLUS) 162-115.2-1 MG CAPS Take 1 capsule by mouth 2 (two) times daily.  05/22/15   [provider]  folic acid (FOLVITE) 712 MCG tablet Take 400 mcg by mouth every evening.     [provider]  furosemide (LASIX) 20 MG tablet Take 1 tablet (20 mg total) by mouth daily. You may take an extra 20 mg as needed for 2-3 lb weight gain overnight 08/23/19   Imogene Burn, PA-C  isosorbide mononitrate (IMDUR) 60 MG 24 hr tablet Take 1 tablet (60 mg total) by mouth daily. 07/07/19   Jettie Booze, MD  JANTOVEN 5 MG tablet TAKE 1/2 TO 1 (ONE-HALF TO ONE) TABLET BY MOUTH ONCE DAILY AS  DIRECTED  BY  COUMADIN  CLINIC Patient taking differently: Take 2.5-5 mg by mouth See  admin  instructions. Take 2.5 mg by mouth at bedtime on Sun/Wed/Fri and 5 mg on Mon/Tues/Thurs/Sat 10/25/19   Jettie Booze, MD  JANUVIA 50 MG tablet Take 50 mg by mouth daily.  06/14/19   [provider]  levofloxacin (LEVAQUIN) 500 MG tablet Take 1 tablet (500 mg total) by mouth daily. 11/24/19   Michel Bickers, MD  losartan (COZAAR) 50 MG tablet Take 50 mg by mouth daily.    [provider]  Multiple Vitamin (MULTIVITAMIN WITH MINERALS) TABS Take 1 tablet by mouth daily.    [provider]  Multiple Vitamins-Minerals (PRESERVISION AREDS 2 PO) Take 1 tablet by mouth 2 (two) times daily.     [provider]  nitroGLYCERIN (NITROSTAT) 0.4 MG SL tablet Place 1 tablet (0.4 mg total) under the tongue every 5 (five) minutes as needed for chest pain. Please make appt for January. 1st attempt Patient not taking: Reported on 11/22/2019 09/07/17   Jettie Booze, MD  Omega-3 Fatty Acids (FISH OIL PO) Take 1 capsule by mouth in the morning and at bedtime.    [provider]  pantoprazole (PROTONIX) 40 MG tablet Take 1 tablet (40 mg total) by mouth 2 (two) times daily before a meal. 11/09/19   Harold Hedge, MD  potassium chloride (KLOR-CON) 10 MEQ tablet Take 10 mEq by mouth 2 (two) times daily.    [provider]  traMADol (ULTRAM) 50 MG tablet Take 50 mg by mouth every 6 (six) hours as needed for moderate pain.    [provider]    Physical Exam: Vitals:   11/29/19 0930 11/29/19 0943 11/29/19 0946 11/29/19 1003  BP: (!) 111/32 (!) 111/32 (!) 119/35 (!) 117/42  Pulse:   (!) 56 68  Resp: (!) 22 14 19 17   Temp:  97.6 F (36.4 C) 97.6 F (36.4 C) 97.7 F (36.5 C)  TempSrc:  Oral Oral Oral  SpO2:  100% 100% 100%     . General:  Appears calm and comfortable and is NAD, reports having anxiety . Eyes:  PERRL, EOMI, normal lids, iris . ENT:  grossly normal hearing, lips & tongue, mmm . Neck:  no LAD, masses or  thyromegaly . Cardiovascular:  RR with mild bradycardia, +click. 2-3+ LE edema.  Marland Kitchen Respiratory:   CTA bilaterally with no wheezes/rales/rhonchi.  Normal respiratory effort. . Abdomen:  soft, NT, ND, NABS . Skin:  no rash or induration seen on limited exam . Musculoskeletal:  grossly normal tone BUE/BLE, good ROM, no bony abnormality . Lower extremity:  2-3+ LE edema.  Limited foot exam with no ulcerations.  2+ distal pulses. Marland Kitchen Psychiatric:  grossly normal mood and affect, speech fluent and appropriate, AOx3 Neurologic:  CN 2-12 grossly intact, moves all extremities in coordinated fashion   Radiological Exams on Admission: No results found.  EKG: Independently reviewed.  Afib with rate 57; IVCD; nonspecific ST changes with no evidence of acute ischemia; NSCSLT   Labs on Admission: I have personally reviewed the available labs and imaging studies at the time of the admission.  Pertinent labs:   CO2 17 Glucose 171 BUN 57/Creatinine 3.22/GFR 13; 36/1.64/28 on 2/17 WBC 6.6 Hgb 5.9; 7.3 on 2/22 INR 2.1 Heme positive COVID pending   Assessment/Plan Principal Problem:   Acute upper GI bleeding Active Problems:   H/O mechanical aortic valve replacement   Coronary Artery Disease   Type II diabetes mellitus with renal manifestations (HCC)   HTN (hypertension)   Stage 4 chronic  kidney disease ([place])   AKI (acute kidney injury) ([place])   Chronic diastolic CHF (congestive heart failure) (HCC)    GI bleed with blood loss anemia -Patient with history recent GI bleed with EGD showing gastric ulcer and small bowel AVM -She was also found to have H pylori -This is complicated by her history of aortic valve replacement and need for chronic anticoagulation -Now presenting with weakness and fecal incontinence -FOBT positive, Hgb 5.9 -Patient transfused 2 unit packed red blood cell  -follow up CBC this afternoon and tomorrow AM -GI consult -NPO for possible repeat  EGD/enteroscopy -Protonix IV for now -Continue H pylori treatment for now (Pepto, doxy, Levaquin)  AKI on stage 4 CKD -Likely associated with volume depletion associated with GI blood loss -Gentle IVF hydration -Hold Cozaar, Lasix  Paroxysmal atrial fibrillation -Continue amiodarone -Resumed coumadin. Goal INR has been 2-2.5 as outpatient.  -Initial goal INR of 1.8 prior to discharge however given the anticipated winter storm and patient's desire to go home, Cardiology stated patient was ok for discharge with INR 1.6 as long as she has close follow up in two days.  Status post prosthetic aortic valve replacement -Goal INR has been 2-2.5 as outpatient (usually 2.5-3.5 with AVR but lower due to h/o GI bleeding)  -Hold Coumadin for now -Cardiology consult given the difficult balance she is facing  Type 2 diabetes -Will check A1c -Hold Januvia -Will cover with sensitive-scale SSI  Hypertension -Hold Cozaar in the setting of AKI -Hold Lopressor due to ABLA and mild hypotension, likely able to resume 3/10  CAD -Hold aspirin for now -Continue Imdur  Hyperlipidemia -Continue Lipitor, Zetia  Chronic diastolic HF  -Preserved EF in 11/2018 -Hold Lasix  -She does have marked B LE edema, will place compression stockings in addition to SCDs   Note: This patient has been tested and is pending for the novel coronavirus COVID-19.  DVT prophylaxis:  SCDs Code Status:   Full - confirmed with patient/family Family Communication: None present; I spoke with the patient's husband by telephone at the time of admission. Disposition Plan: She is anticipated to d/c to home without [place] services once her GI issues have been resolved. Consults called: Cardiology; GI  Admission status: Admit - It is my clinical opinion that admission to INPATIENT is reasonable and necessary because of the expectation that this patient will require hospital care that crosses at least 2 midnights to treat this  condition based on the medical complexity of the problems presented.  Given the aforementioned information, the predictability of an adverse outcome is felt to be significant.    Karmen Bongo MD [facility]   How to contact the [facility] Attending or Consulting provider [place] or covering provider during after hours [place], for this patient?  1. Check the care team in [facility] and look for a) attending/consulting [facility] provider listed and b) the [facility] team listed 2. Log into [email or web address] and use [place]'s universal password to access. If you do not have the password, please contact the hospital operator. 3. Locate the [facility] provider you are looking for under [facility] and page to a number that you can be directly reached. 4. If you still have difficulty reaching the provider, please page the [facility] (Director on Call) for the Hospitalists listed on amion for assistance.   11/29/2019, 10:51 AM

## 2019-11-29 NOTE — ED Notes (Signed)
Spoke to pt husband and provided update. Husband verbalized understanding.

## 2019-11-29 NOTE — Consult Note (Addendum)
Cardiology Consultation:   Patient ID: [name] MRN: [id number]; DOB: 1933-07-02  Admit date: 11/29/2019 Date of Consult: 11/29/2019  Primary Care Provider: Gaynelle Arabian, MD Primary Cardiologist: Larae Grooms, MD  Primary Electrophysiologist:  None    Patient Profile:   [name] is a 86 y.o. female with a hx of CAD s/p CABG + mechanical AVR ('90), subsequent PCI of RCA with BMA, HTN, HL, DM, prior GI bleed, CKD, diastolic CHF, and PAF who is being seen today for the evaluation of GI bleed in the setting of anticoagulation at the request of Dr. Lorin Mercy.  History of Present Illness:   [name] is an 86 yo female with PMH noted above. She is followed by Dr. Irish Lack as an outpatient. Underwent cardiac cath in 2016 for anginal symptoms showing severe native 3v disease with patent LIMA-LAD, and SVG-diag with occluded SVG-OM/PDA. She had an ostial lesion on the RCA which was treated with a synergy DES. She was continued on coumadin and plavix at that time (no ASA). She has had several episodes of GI bleeding in the past.   Admitted in 3/20 with sepsis 2/2 to gram - bacteremia E. Coli from UTI. Echo during that admission showed normal LV function with moderate LVH and normal functioning mechanical aortic valve, possible mitral valve with small mobile vegetation in the anterior leaflet and smal ASD. TEE was done and negative for vegetations. Had a successful DCCV on 11/20 for atrial fibrillation.   Had an admission in 2/21 for GI bleeding and coumadin was held. Had  with EGD. Hgb was reported at 7.3 at the time of discharge. It was recommended that her ASA be held for 4 weeks. She was seen back in the office on 11/14/19 with Dr. Irish Lack and reported generalized weakness and right leg/flank pain. No hematuria. Patient was restarted on her coumadin managed by the coumadin clinic.  The patient presented to the ER 11/29/2019 for weakness.  Reported explosive diarrhea the night before with  weakness. She also noted her stools have been darker than normal.  Did vomit but did not notice any blood.  She has chronic dyspnea on exertion but denied recent worsening. No chest pain.  Denied dizziness or lightheadedness.  No abdominal pain. In the ED blood pressure 102/36, pulse 64, afebrile, respiratory rate 18, 90% O2.  Fecal occult blood was positive.  Hemoglobin came back at 5.9.  WBC 6.6, creatinine 3.22.  INR 1.5.  2 units of PRBCs given.  Received liter normal saline bolus for hypotension. EKG showed junctional rhythm. Patient was admitted for further work-up.   Heart Pathway Score:     Past Medical History:  Diagnosis Date  . Anemia   . Anginal pain ([place])   . Arteriovenous malformation of gastrointestinal tract   . Arthritis    "fingers mostly" (04/12/2015)  . Carotid artery disease ([place])    Carotid US 3/17:  40-08% RICA; 67-61% LICA; Elevated bilateral subclavian artery velocities >>f/u 1 year. // Carotid US 4/18: R 40-59; L 1-39 >> FU 1 year // Carotid US 10/2018: R 40-59; L 1-39, L subclavian stenosis   . CHF (congestive heart failure) ([place])   . Chronic kidney disease (CKD), stage III (moderate)   . Chronic lower GI bleeding    "today; last time was ~ 8 yr ago; used to have them often before that too" (02/11/2013)  . Coronary artery disease   . DJD (degenerative joint disease)   . Dysrhythmia   . Heart murmur   .  History of blood transfusion    "a few times over the years; usually related to my Coumadin" (04/12/2015  . History of gout   . Hyperlipemia   . Hypertension   . Macula lutea degeneration   . Old MI (myocardial infarction)    "a coulple /dr in 02/2008; I never even knew I'd had them" (04/12/2015)  . Type II diabetes mellitus ([place])     Past Surgical History:  Procedure Laterality Date  . AORTIC VALVE REPLACEMENT  1993  . APPENDECTOMY  1953  . BIOPSY  11/06/2019   Procedure: BIOPSY;  Surgeon: Doran Stabler, MD;  Location: [place];  Service:  Gastroenterology;;  . CARDIAC CATHETERIZATION  [phone]  . CARDIAC CATHETERIZATION  04/12/2015   Procedure: Coronary Stent Intervention;  Surgeon: Jettie Booze, MD;    SYNERGY DES 3.5X16 to the ostial RCA   . CARDIAC CATHETERIZATION  04/12/2015   Procedure: Coronary/Graft Angiography;  Surgeon: Eloy End, MD; LAD & CFX 100%, patent LIMA-LAD, SVG-D1; SVG-OM-PDA first limb 100%, 2nd limb patent; oRCA 75%>0 w/ stent  . [place]   St. Jude/notes 10/29/2003 (02/11/2013)  . CARDIOVERSION N/A 08/15/2019   Procedure: CARDIOVERSION;  Surgeon: Acie Fredrickson Wonda Cheng, MD;  Location: [facility] ENDOSCOPY;  Service: Cardiovascular;  Laterality: N/A;  . COLONOSCOPY N/A 02/13/2013   Procedure: COLONOSCOPY;  Surgeon: Lafayette Dragon, MD;  Location: [facility] ENDOSCOPY;  Service: Endoscopy;  Laterality: N/A;  . CORONARY ANGIOPLASTY WITH STENT PLACEMENT  2009+   "3 at least; put in 1 stent at a time" (02/11/2013)  . CORONARY ARTERY BYPASS GRAFT  1990   LIMA-LAD, SVG-OM-PDA, SVG-D1  . DILATION AND CURETTAGE OF UTERUS  1960's   'after a miscarriage" (02/11/2013)  . ENTEROSCOPY N/A 02/13/2013   Procedure: ENTEROSCOPY;  Surgeon: Lafayette Dragon, MD;  Location: [facility] ENDOSCOPY;  Service: Endoscopy;  Laterality: N/A;  . ENTEROSCOPY N/A 11/06/2019   Procedure: ENTEROSCOPY;  Surgeon: Doran Stabler, MD;  Location: [facility] ENDOSCOPY;  Service: Gastroenterology;  Laterality: N/A;  . EXPLORATORY LAPAROTOMY  02/24/2008   which revealed a retroperitoneal hematoma and bleeding from the right external iliac artery/notes 03/02/2008 (02/11/2013)   . FLEXIBLE SIGMOIDOSCOPY N/A 04/21/2015   Procedure: FLEXIBLE SIGMOIDOSCOPY;  Surgeon: Ladene Artist, MD;  Location: [facility] ENDOSCOPY;  Service: Endoscopy;  Laterality: N/A;  . HOT HEMOSTASIS N/A 11/06/2019   Procedure: HOT HEMOSTASIS (ARGON PLASMA COAGULATION/BICAP);  Surgeon: Doran Stabler, MD;  Location: [place];  Service: Gastroenterology;  Laterality: N/A;  . TEE WITHOUT  CARDIOVERSION N/A 12/01/2018   Procedure: TRANSESOPHAGEAL ECHOCARDIOGRAM (TEE);  Surgeon: Jerline Pain, MD;  Location: [facility] ENDOSCOPY;  Service: Cardiovascular;  Laterality: N/A;     Home Medications:  Prior to Admission medications   Medication Sig Start Date End Date Taking? Authorizing Provider  amiodarone (PACERONE) 200 MG tablet Take 1 tablet (200 mg total) by mouth daily. 09/06/19  Yes Imogene Burn, PA-C  aspirin EC 81 MG tablet Take 81 mg by mouth daily.   Yes [provider]  atorvastatin (LIPITOR) 80 MG tablet Take 1 tablet by mouth once daily 09/19/19  Yes Jettie Booze, MD  bevacizumab (AVASTIN) 1.25 mg/0.1 mL SOLN Place 1.25 mg into the left eye every 8 (eight) weeks.   Yes [provider]  bismuth subsalicylate (PEPTO BISMOL) 262 MG chewable tablet Chew 2 tablets (524 mg total) by mouth 3 (three) times daily. 11/11/19  Yes Danis, Kirke Corin, MD  doxycycline (VIBRA-TABS) 100  MG tablet Take 1 tablet (100 mg total) by mouth 2 (two) times daily. 11/24/19  Yes Michel Bickers, MD  ezetimibe (ZETIA) 10 MG tablet TAKE 1 TABLET BY MOUTH ONCE DAILY PLEASE  MAKE  OVERDUE  APPOINTMENT  WITH  DR  Irish Lack  BEFORE  ANYMORE  REFILLS Patient taking differently: Take 10 mg by mouth daily.  07/06/19  Yes Jettie Booze, MD  FeFum-FePo-FA-B Cmp-C-Zn-Mn-Cu (SE-TAN PLUS) 162-115.2-1 MG CAPS Take 1 capsule by mouth 2 (two) times daily.  05/22/15  Yes [provider]  folic acid (FOLVITE) 417 MCG tablet Take 400 mcg by mouth every evening.    Yes [provider]  furosemide (LASIX) 40 MG tablet Take 40 mg by mouth daily. 11/19/19  Yes [provider]  isosorbide mononitrate (IMDUR) 60 MG 24 hr tablet Take 1 tablet (60 mg total) by mouth daily. Patient taking differently: Take 120 mg by mouth daily.  07/07/19  Yes Varanasi, Charlann Lange, MD  JANTOVEN 5 MG tablet TAKE 1/2 TO 1 (ONE-HALF TO ONE) TABLET BY MOUTH ONCE DAILY AS  DIRECTED  BY  COUMADIN   CLINIC Patient taking differently: Take 2.5-5 mg by mouth See admin instructions. Take 2.5 mg by mouth at bedtime on Sun/Wed and 5 mg on Mon/Tues/Thurs/Fri/Sat 10/25/19  Yes Jettie Booze, MD  JANUVIA 50 MG tablet Take 50 mg by mouth daily.  06/14/19  Yes [provider]  levofloxacin (LEVAQUIN) 500 MG tablet Take 1 tablet (500 mg total) by mouth daily. 11/24/19  Yes Michel Bickers, MD  losartan (COZAAR) 50 MG tablet Take 50 mg by mouth daily.   Yes [provider]  metoprolol tartrate (LOPRESSOR) 25 MG tablet Take 25 mg by mouth 2 (two) times daily.   Yes [provider]  Multiple Vitamin (MULTIVITAMIN WITH MINERALS) TABS Take 1 tablet by mouth daily.   Yes [provider]  Multiple Vitamins-Minerals (PRESERVISION AREDS 2 PO) Take 1 tablet by mouth 2 (two) times daily.    Yes [provider]  nitroGLYCERIN (NITROSTAT) 0.4 MG SL tablet Place 1 tablet (0.4 mg total) under the tongue every 5 (five) minutes as needed for chest pain. Please make appt for January. 1st attempt 09/07/17  Yes Jettie Booze, MD  Omega-3 Fatty Acids (FISH OIL PO) Take 2 capsules by mouth in the morning and at bedtime.    Yes [provider]  pantoprazole (PROTONIX) 40 MG tablet Take 1 tablet (40 mg total) by mouth 2 (two) times daily before a meal. 11/09/19  Yes Harold Hedge, MD  traMADol (ULTRAM) 50 MG tablet Take 50 mg by mouth every 6 (six) hours as needed for moderate pain.   Yes [provider]    Inpatient Medications: Scheduled Meds: . amiodarone  200 mg Oral Daily  . atorvastatin  80 mg Oral Daily  . bismuth subsalicylate  408 mg Oral TID  . doxycycline  100 mg Oral BID  . ezetimibe  10 mg Oral Daily  . folic acid  1 mg Oral QPM  . insulin aspart  0-9 Units Subcutaneous TID WC  . isosorbide mononitrate  120 mg Oral Daily  . levofloxacin  500 mg Oral Q48H  . pantoprazole (PROTONIX) IV  40 mg Intravenous Q12H  . sodium chloride flush  3 mL  Intravenous Q12H   Continuous Infusions: . lactated ringers     PRN Meds: acetaminophen **OR** acetaminophen, LORazepam, ondansetron **OR** ondansetron (ZOFRAN) IV, traMADol  Allergies:    Allergies  Allergen  Reactions  . Darvon [Propoxyphene] Nausea And Vomiting  . Lisinopril Cough  . Septra [Sulfamethoxazole-Trimethoprim] Other (See Comments)    Increased INR  . Warfarin And Related Other (See Comments)    ONLY TOLERATES BRAND  . Penicillins Rash    Did it involve swelling of the face/tongue/throat, SOB, or low BP? No Did it involve sudden or severe rash/hives, skin peeling, or any reaction on the inside of your mouth or nose? No Did you need to seek medical attention at a hospital or doctor's office? No When did it last happen?15 years If all above answers are "NO", may proceed with cephalosporin use.    Social History:   Social History   Socioeconomic History  . Marital status: Married    Spouse name: Not on file  . Number of children: Not on file  . Years of education: Not on file  . Highest education level: Not on file  Occupational History  . Not on file  Tobacco Use  . Smoking status: Never Smoker  . Smokeless tobacco: Never Used  Substance and Sexual Activity  . Alcohol use: No  . Drug use: No  . Sexual activity: Yes  Other Topics Concern  . Not on file  Social History Narrative  . Not on file   Social Determinants of Health   Financial Resource Strain:   . Difficulty of Paying Living Expenses: Not on file  Food Insecurity:   . Worried About Charity fundraiser in the Last Year: Not on file  . Ran Out of Food in the Last Year: Not on file  Transportation Needs:   . Lack of Transportation (Medical): Not on file  . Lack of Transportation (Non-Medical): Not on file  Physical Activity:   . Days of Exercise per Week: Not on file  . Minutes of Exercise per Session: Not on file  Stress:   . Feeling of Stress : Not on file  Social Connections:     . Frequency of Communication with Friends and Family: Not on file  . Frequency of Social Gatherings with Friends and Family: Not on file  . Attends Religious Services: Not on file  . Active Member of Clubs or Organizations: Not on file  . Attends Archivist Meetings: Not on file  . Marital Status: Not on file  Intimate Partner Violence:   . Fear of Current or Ex-Partner: Not on file  . Emotionally Abused: Not on file  . Physically Abused: Not on file  . Sexually Abused: Not on file    Family History:   Family History  Problem Relation Age of Onset  . Heart disease Mother   . Heart disease Father      ROS:  Please see the history of present illness.  All other ROS reviewed and negative.     Physical Exam/Data:   Vitals:   11/29/19 1230 11/29/19 1240 11/29/19 1300 11/29/19 1315  BP: (!) 110/51 (!) 110/51 (!) 110/49 111/71  Pulse:  (!) 58    Resp: _0 Temp:  97.9 F (36.6 C)    TempSrc:  Oral    SpO2:        Intake/Output Summary (Last 24 hours) at 11/29/2019 1431 Last data filed at 11/29/2019 1241 Gross per 24 hour  Intake 1597 ml  Output --  Net 1597 ml   Last 3 Weights 11/22/2019 11/14/2019 11/03/2019  Weight (lbs) 142 lb 142 lb 12.8 oz 139 lb 12.4 oz  Weight (kg)  64.411 kg 64.774 kg 63.4 kg     There is no height or weight on file to calculate BMI.  General:  Well nourished, well developed, in no acute distress HEENT: normal Lymph: no adenopathy Neck: no JVD Endocrine:  No thryomegaly Vascular: No carotid bruits; FA pulses 2+ bilaterally without bruits  Cardiac:  RRR; +murmur with mechanical valve click Lungs:  clear to auscultation bilaterally, no wheezing, rhonchi or rales  Abd: soft, nontender, no hepatomegaly  Ext: minimal pedal edema Musculoskeletal:  No deformities, BUE and BLE strength normal and equal Skin: warm and dry  Neuro:  CNs 2-12 intact, no focal abnormalities noted Psych:  Normal affect   EKG:  The EKG was personally  reviewed and demonstrates: Junctional rhythm 57 bpm, LAD Telemetry:  Telemetry was personally reviewed and demonstrates:  Junctional rthym  Relevant CV Studies:  Echo 12/10/2018 1. The left ventricle has normal systolic function with an ejection  fraction of 60-65%. The cavity size was normal. There is moderately  increased left ventricular wall thickness. Left ventricular diastolic  function could not be evaluated secondary to  atrial fibrillation. No evidence of left ventricular regional wall motion  abnormalities.  2. Left atrial size was mildly dilated.  3. The mitral valve is degenerative. Moderate thickening of the mitral  valve leaflet. Moderate calcification of the mitral valve leaflet. There  is moderate mitral annular calcification present. A cannot exclude  vegetation vegetation is seen on the  mitral leaflet.  4. Aortic valve regurgitation is trivial by color flow Doppler. mild  stenosis of the aortic valve.  5. The ascending aorta and aortic root are normal in size and structure.  6. When compared to the prior study: 7/9/201: LVEF 60-65%, normal  mechanical AOV prosthesis, mean gradient 28 mmHg, MAC with mild MR.  7. Cannot exclude small ASD.   Laboratory Data:  High Sensitivity Troponin:   Recent Labs  Lab 11/04/19 0337 11/04/19 0453  TROPONINIHS 92* 116*     Chemistry Recent Labs  Lab 11/29/19 0447  NA 135  K 5.1  CL 102  CO2 17*  GLUCOSE 171*  BUN 57*  CREATININE 3.22*  CALCIUM 8.3*  GFRNONAA 13*  GFRAA 15*  ANIONGAP 16*    No results for input(s): PROT, ALBUMIN, AST, ALT, ALKPHOS, BILITOT in the last 168 hours. Hematology Recent Labs  Lab 11/29/19 0447  WBC 6.6  RBC 1.85*  HGB 5.9*  HCT 20.9*  MCV 113.0*  MCH 31.9  MCHC 28.2*  RDW 17.2*  PLT 268   BNPNo results for input(s): BNP, PROBNP in the last 168 hours.  DDimer No results for input(s): DDIMER in the last 168 hours.   Radiology/Studies:  No results  found. {  Assessment and Plan:   Anemia  -Patient fecal occult positive and hemoglobin 5.9 admission s/p 2 unit packed red blood cells . Coumadin held -GI following -Patient had a recent admission for GI bleeding with findings of nonbleeding gastric ulcers, intestinal AVMs s/p APC -Plan to continue PPI -Patient is n.p.o. for possible procedure  S/p Mechanical Aortic valve replacement - On chronic Coumadin followed by coumadin clinic. She was taking 4m MWF? And 1/2 tab the other days. The last time she took it was 2 nights ago (559mtablet) - INR 1.5 on admission.  2.1 today, goal 2-2.5 - Coumadin has been held in the setting of worsening anemia - Continue to hold coumadin for now since INR is therapeutic. Patient will need heparin once to keep this  therapeutic. Continue to monitor Hgb.   Paroxysmal A. Fib -Continue amiodarone - EKG shows junctional rhythm, HR in the upper 50s -She was on Coumadin for stroke prophylaxis, goal INR 2-2.5.  - Coumadin held as above>>will likely need IV heparin   PUD/H. Pylori -Started on antibiotics yesterday  CAD s/p CABG with supsequent PCI - Aspirin held - Imdur continued  Hypertension -On Lopressor and Cozaar at baseline - Cozaar held in the setting of AKI -Lopressor held for hypotension - Imdur continued  Chronic diastolic heart failure -Echo 12/10/2018 showed preserved EF -Lasix on hold due to mild hypotension -Compression stockings placed - patient has mild pedal edema on exam  For questions or updates, please contact [facility] Please consult [email or web address] for contact info under    Signed, Cadence Jorene Minors 11/29/2019 2:31 PM    Patient seen and examined. Agree with assessment and plan.  [name] is an 86 year old female who is followed by Dr. Illene Bolus.  In 1990 she underwent CABG revascularization surgery in 1993 underwent mechanical aortic valve replacement.  She is status post subsequent PCI to her RCA in 2016.   She has a history of hypertension, hyperlipidemia, diabetes mellitus, diastolic heart failure, PAF, and has had recent issues with GI bleed.  She was admitted in February 2021 for GI bleeding and Coumadin was held.  An EGD was performed which revealed nonbleeding gastric ulcers, intestinal AVM status post APC.  She was ultimately started back on warfarin anticoagulation.  She presented to the emergency room with recent weakness and had an episode of explosive diarrhea with dark heme positive stool.  Her last dose of warfarin was 2 nights ago when she took 5 mg.  Her INR today is 2.1.  Coumadin is currently on hold.  Patient denies any chest pain or anginal symptoms but does admit to shortness of breath with minimal activity.  She also admits to leg swelling.  Hemoglobin today is 5.9 and she has received 2 units of packed red blood cells. There is acute kidney injury with creatinine increasing from 1.64 to 3.22.  Presently, her nausea has improved.  Blood pressure is stable at 111/71.  There is no JVD.  Lungs are clear.  Rhythm is regular with crisp aortic valve clicks and 2/6 systolic murmur.  There is no S3 gallop.  Bowel sounds are positive.  She has at least 1+ lower extremity edema, right greater than left.  Neurologic exam is grossly nonfocal.  ECG shows possible junctional rhythm at 57 bpm versus very small hardly detectable P waves.  There is an IVCD, left axis deviation and poor R wave progression.  Presently, agree with holding Coumadin.  Follow-up CBC post transfusions.  Will follow up with INR tomorrow.  and await GI decision with reference to follow-up GI procedure.  Obvious concern of holding Coumadin too long without systemic anticoagulation in light of potential for mechanical valve thrombosis.  Will follow.    Troy Sine, MD, [facility] 11/29/2019 4:21 PM

## 2019-11-29 NOTE — ED Notes (Signed)
Pt stated she would like to walk around the room to stretch legs. Pt ambulated with assistance to the restroom. Peri care was performed and a clean brief and clean purewick was applied. Pt repositioned in bed and provided some water.

## 2019-11-29 NOTE — Progress Notes (Signed)
Asked to admit [name]. She is being treated for H pyori after recent admission with UGIB with gastric ulcer and H pylori. She is on coumadin for mechanical valve and presents with diarrhea. She denies abdominal pain, hematemesis, or frank blood in stool.   She reports recent melena, is found to have Hgb of 5.9 (from 7.3 on 11/14/19), AKI on CKD IV, INR 1.5, and is FOBT+.   She has 2 units RBC ordered.   MAP has remained <65. Does not appear that any IVF has been ordered in ED. She will be given a liter NS bolus and then reassessed. If BP improves with saline and blood, she can be admitted to the stepdown unit.

## 2019-11-29 NOTE — ED Notes (Signed)
Pt placed on hospital bed for comfort. Pt cleaned with full bed change. Pt is comfortable but is requesting something for leg pain and anxiety.

## 2019-11-29 NOTE — ED Provider Notes (Signed)
[facility] EMERGENCY DEPARTMENT Provider Note   CSN: [id number] Arrival date & time: 11/29/19  0630   History Chief Complaint  Patient presents with  . Weakness    [name] is a 86 y.o. female.  The history is provided by the patient.  Weakness She has history of hypertension, diabetes, hyperlipidemia, chronic kidney disease, chronic GI bleeding, paroxysmal atrial fibrillation and is anticoagulated on warfarin.  She comes in because she had an explosive episode of diarrhea tonight.  She states her stools have been darker than normal but she has not noticed any frank blood.  She did vomit once last night and does not remember any blood in it.  She has chronic exertional dyspnea which has not changed recently.  She denies chest pain or abdominal pain.  Past Medical History:  Diagnosis Date  . Anemia   . Anginal pain ([place])   . Arteriovenous malformation of gastrointestinal tract   . Arthritis    "fingers mostly" (04/12/2015)  . Carotid artery disease ([place])    Carotid US 3/17:  16-01% RICA; 09-32% LICA; Elevated bilateral subclavian artery velocities >>f/u 1 year. // Carotid US 4/18: R 40-59; L 1-39 >> FU 1 year // Carotid US 10/2018: R 40-59; L 1-39, L subclavian stenosis   . CHF (congestive heart failure) ([place])   . Chronic kidney disease (CKD), stage III (moderate)   . Chronic lower GI bleeding    "today; last time was ~ 8 yr ago; used to have them often before that too" (02/11/2013)  . Coronary artery disease   . DJD (degenerative joint disease)   . Dysrhythmia   . Heart murmur   . History of blood transfusion    "a few times over the years; usually related to my Coumadin" (04/12/2015  . History of gout   . Hyperlipemia   . Hypertension   . Macula lutea degeneration   . Old MI (myocardial infarction)    "a coulple /dr in 02/2008; I never even knew I'd had them" (04/12/2015)  . Type II diabetes mellitus [facility])     Patient Active Problem List   Diagnosis Date Noted  . Gastric ulcer due to Helicobacter pylori 35/57/3220  . History of GI bleed 11/04/2019  . Symptomatic anemia 11/03/2019  . Chronic diastolic CHF (congestive heart failure) ([place]) 10/02/2018  . AVM (arteriovenous malformation) of small bowel, acquired   . HTN (hypertension) 04/15/2015  . Hyperlipidemia 04/15/2015  . CKD (chronic kidney disease), stage III ([place]) 04/15/2015  . PAF (paroxysmal atrial fibrillation) ([place]) 04/15/2015  . Carotid stenosis 11/28/2013  . Encounter for therapeutic drug monitoring 11/01/2013  . Aortic valve disorder 07/12/2013  . Normocytic anemia 02/11/2013  . Chronic anticoagulation 02/11/2013  . H/O mechanical aortic valve replacement 02/11/2013  . Coronary Artery Disease 02/11/2013  . Type II diabetes mellitus with renal manifestations ([place]) 02/11/2013    Past Surgical History:  Procedure Laterality Date  . AORTIC VALVE REPLACEMENT  1993  . APPENDECTOMY  1953  . BIOPSY  11/06/2019   Procedure: BIOPSY;  Surgeon: Doran Stabler, MD;  Location: [facility];  Service: Gastroenterology;;  . CARDIAC CATHETERIZATION  [phone]  . CARDIAC CATHETERIZATION  04/12/2015   Procedure: Coronary Stent Intervention;  Surgeon: Jettie Booze, MD;    SYNERGY DES 3.5X16 to the ostial RCA   . CARDIAC CATHETERIZATION  04/12/2015   Procedure: Coronary/Graft Angiography;  Surgeon: Eloy End, MD; LAD & CFX 100%, patent LIMA-LAD, SVG-D1; SVG-OM-PDA first limb  100%, 2nd limb patent; oRCA 75%>0 w/ stent  . [place]   St. Jude/notes 10/29/2003 (02/11/2013)  . CARDIOVERSION N/A 08/15/2019   Procedure: CARDIOVERSION;  Surgeon: Acie Fredrickson Wonda Cheng, MD;  Location: [facility] ENDOSCOPY;  Service: Cardiovascular;  Laterality: N/A;  . COLONOSCOPY N/A 02/13/2013   Procedure: COLONOSCOPY;  Surgeon: Lafayette Dragon, MD;  Location: [facility] ENDOSCOPY;  Service: Endoscopy;  Laterality: N/A;  . CORONARY ANGIOPLASTY WITH STENT PLACEMENT  2009+   "3 at least; put  in 1 stent at a time" (02/11/2013)  . CORONARY ARTERY BYPASS GRAFT  1990   LIMA-LAD, SVG-OM-PDA, SVG-D1  . DILATION AND CURETTAGE OF UTERUS  1960's   'after a miscarriage" (02/11/2013)  . ENTEROSCOPY N/A 02/13/2013   Procedure: ENTEROSCOPY;  Surgeon: Lafayette Dragon, MD;  Location: [facility] ENDOSCOPY;  Service: Endoscopy;  Laterality: N/A;  . ENTEROSCOPY N/A 11/06/2019   Procedure: ENTEROSCOPY;  Surgeon: Doran Stabler, MD;  Location: [facility] ENDOSCOPY;  Service: Gastroenterology;  Laterality: N/A;  . EXPLORATORY LAPAROTOMY  02/24/2008   which revealed a retroperitoneal hematoma and bleeding from the right external iliac artery/notes 03/02/2008 (02/11/2013)   . FLEXIBLE SIGMOIDOSCOPY N/A 04/21/2015   Procedure: FLEXIBLE SIGMOIDOSCOPY;  Surgeon: Ladene Artist, MD;  Location: [facility] ENDOSCOPY;  Service: Endoscopy;  Laterality: N/A;  . HOT HEMOSTASIS N/A 11/06/2019   Procedure: HOT HEMOSTASIS (ARGON PLASMA COAGULATION/BICAP);  Surgeon: Doran Stabler, MD;  Location: [facility];  Service: Gastroenterology;  Laterality: N/A;  . TEE WITHOUT CARDIOVERSION N/A 12/01/2018   Procedure: TRANSESOPHAGEAL ECHOCARDIOGRAM (TEE);  Surgeon: Jerline Pain, MD;  Location: [facility] ENDOSCOPY;  Service: Cardiovascular;  Laterality: N/A;     OB History   No obstetric history on file.     Family History  Problem Relation Age of Onset  . Heart disease Mother   . Heart disease Father     Social History   Tobacco Use  . Smoking status: Never Smoker  . Smokeless tobacco: Never Used  Substance Use Topics  . Alcohol use: No  . Drug use: No    Home Medications Prior to Admission medications   Medication Sig Start Date End Date Taking? Authorizing Provider  acetaminophen (TYLENOL) 500 MG tablet Take 500 mg by mouth every 6 (six) hours as needed for mild pain or headache.    [provider]  amiodarone (PACERONE) 200 MG tablet Take 1 tablet (200 mg total) by mouth daily. 09/06/19   Imogene Burn, PA-C    atorvastatin (LIPITOR) 80 MG tablet Take 1 tablet by mouth once daily 09/19/19   Jettie Booze, MD  bevacizumab (AVASTIN) 1.25 mg/0.1 mL SOLN Place 1.25 mg into the left eye every 8 (eight) weeks.    [provider]  bismuth subsalicylate (PEPTO BISMOL) 262 MG chewable tablet Chew 2 tablets (524 mg total) by mouth 3 (three) times daily. Patient not taking: Reported on 11/22/2019 11/11/19   Doran Stabler, MD  doxycycline (VIBRA-TABS) 100 MG tablet Take 1 tablet (100 mg total) by mouth 2 (two) times daily. 11/24/19   Michel Bickers, MD  ezetimibe (ZETIA) 10 MG tablet TAKE 1 TABLET BY MOUTH ONCE DAILY PLEASE  MAKE  OVERDUE  APPOINTMENT  WITH  DR  [place]  ANYMORE  REFILLS 07/06/19   Jettie Booze, MD  FeFum-FePo-FA-B Cmp-C-Zn-Mn-Cu (SE-TAN PLUS) 162-115.2-1 MG CAPS Take 1 capsule by mouth 2 (two) times daily.  05/22/15   [provider]  folic acid (FOLVITE) 149 MCG  tablet Take 400 mcg by mouth every evening.     [provider]  furosemide (LASIX) 20 MG tablet Take 1 tablet (20 mg total) by mouth daily. You may take an extra 20 mg as needed for 2-3 lb weight gain overnight 08/23/19   Imogene Burn, PA-C  isosorbide mononitrate (IMDUR) 60 MG 24 hr tablet Take 1 tablet (60 mg total) by mouth daily. 07/07/19   Jettie Booze, MD  JANTOVEN 5 MG tablet TAKE 1/2 TO 1 (ONE-HALF TO ONE) TABLET BY MOUTH ONCE DAILY AS  DIRECTED  BY  COUMADIN  CLINIC Patient taking differently: Take 2.5-5 mg by mouth See admin instructions. Take 2.5 mg by mouth at bedtime on Sun/Wed/Fri and 5 mg on Mon/Tues/Thurs/Sat 10/25/19   Jettie Booze, MD  JANUVIA 50 MG tablet Take 50 mg by mouth daily.  06/14/19   [provider]  levofloxacin (LEVAQUIN) 500 MG tablet Take 1 tablet (500 mg total) by mouth daily. 11/24/19   Michel Bickers, MD  losartan (COZAAR) 50 MG tablet Take 50 mg by mouth daily.    [provider]  Multiple Vitamin (MULTIVITAMIN WITH  MINERALS) TABS Take 1 tablet by mouth daily.    [provider]  Multiple Vitamins-Minerals (PRESERVISION AREDS 2 PO) Take 1 tablet by mouth 2 (two) times daily.     [provider]  nitroGLYCERIN (NITROSTAT) 0.4 MG SL tablet Place 1 tablet (0.4 mg total) under the tongue every 5 (five) minutes as needed for chest pain. Please make appt for January. 1st attempt Patient not taking: Reported on 11/22/2019 09/07/17   Jettie Booze, MD  Omega-3 Fatty Acids (FISH OIL PO) Take 1 capsule by mouth in the morning and at bedtime.    [provider]  pantoprazole (PROTONIX) 40 MG tablet Take 1 tablet (40 mg total) by mouth 2 (two) times daily before a meal. 11/09/19   Harold Hedge, MD  potassium chloride (KLOR-CON) 10 MEQ tablet Take 10 mEq by mouth 2 (two) times daily.    [provider]  traMADol (ULTRAM) 50 MG tablet Take 50 mg by mouth every 6 (six) hours as needed for moderate pain.    [provider]    Allergies    Darvon [propoxyphene], Lisinopril, Septra [sulfamethoxazole-trimethoprim], Warfarin and related, and Penicillins  Review of Systems   Review of Systems  Neurological: Positive for weakness.  All other systems reviewed and are negative.   Physical Exam Updated Vital Signs BP (!) 102/36 (BP Location: Right Arm)   Pulse 64   Temp 98.6 F (37 C) (Oral)   Resp 18   SpO2 98%   Physical Exam Vitals and nursing note reviewed.   86 year old female, resting comfortably and in no acute distress. Vital signs are normal. Oxygen saturation is 98%, which is normal. Head is normocephalic and atraumatic. PERRLA, EOMI. Oropharynx is clear.  Conjunctivae are pale. Neck is nontender and supple without adenopathy or JVD. Back is nontender and there is no CVA tenderness. Lungs are clear without rales, wheezes, or rhonchi. Chest is nontender. Heart has regular rate and rhythm without murmur. Abdomen is soft, flat, nontender without masses or  hepatosplenomegaly and peristalsis is normoactive. Extremities have no cyanosis or edema, full range of motion is present. Skin is pale, warm, and dry without rash. Neurologic: Mental status is normal, cranial nerves are intact, there are no motor or sensory deficits.  ED Results / Procedures / Treatments   Labs (all labs ordered  are listed, but only abnormal results are displayed) Labs Reviewed  CBG MONITORING, ED - Abnormal; Notable for the following components:      Result Value   Glucose-Capillary 147 (*)    All other components within normal limits  POC OCCULT BLOOD, ED - Abnormal; Notable for the following components:   Fecal Occult Bld POSITIVE (*)    All other components within normal limits  BASIC METABOLIC PANEL  CBC  URINALYSIS, ROUTINE W REFLEX MICROSCOPIC  TYPE AND SCREEN    EKG EKG Interpretation  Date/Time:  Tuesday November 29 2019 04:42:13 EST Ventricular Rate:  57 PR Interval:    QRS Duration: 139 QT Interval:  498 QTC Calculation: 485 R Axis:   -63 Text Interpretation: Undetermined rhythm Nonspecific IVCD with LAD Probable anteroseptal infarct, old Nonspecific T abnormalities, lateral leads When compared with ECG of 11/03/2019, No significant change was found Confirmed by Delora Fuel ([id number]) on 11/29/2019 5:18:15 AM   Radiology No results found.  Procedures Procedures  CRITICAL CARE Performed by: Delora Fuel Total critical care time: 45 minutes Critical care time was exclusive of separately billable procedures and treating other patients. Critical care was necessary to treat or prevent imminent or life-threatening deterioration. Critical care was time spent personally by me on the following activities: development of treatment plan with patient and/or surrogate as well as nursing, discussions with consultants, evaluation of patient's response to treatment, examination of patient, obtaining history from patient or surrogate, ordering and performing treatments  and interventions, ordering and review of laboratory studies, ordering and review of radiographic studies, pulse oximetry and re-evaluation of patient's condition.  Medications Ordered in ED Medications  sodium chloride flush (NS) 0.9 % injection 3 mL (has no administration in time range)  0.9 %  sodium chloride infusion (has no administration in time range)  sodium chloride 0.9 % bolus 1,000 mL (has no administration in time range)    ED Course  I have reviewed the triage vital signs and the nursing notes.  Pertinent labs & imaging results that were available during my care of the patient were reviewed by me and considered in my medical decision making (see chart for details).  MDM Rules/Calculators/A&P  Diarrhea and patient with history of GI bleeding.  Stool was sent for Hemoccult testing and was positive.  Hemoglobin has come back at 5.9 compared with 7.3 on 11/14/2019.  Arrangements are made for transfusion of 2 units of packed red blood cells, and will plan to admit once metabolic panel has come back.  Old records are reviewed showing that she is being treated for C. difficile infection and there has been difficulty in maintaining appropriate INR while on antibiotics which affect warfarin.  Also, prior hospitalizations for GI bleed and anemia.  Metabolic panel has come back showing evidence of acute kidney injury.  Case is discussed with Dr. Myna Hidalgo of [facility] hospitalist, who agrees to admit the patient.  Final Clinical Impression(s) / ED Diagnoses Final diagnoses:  Gastrointestinal hemorrhage, unspecified gastrointestinal hemorrhage type  Acute kidney injury (nontraumatic) ([place])    Rx / DC Orders ED Discharge Orders    None       Delora Fuel, MD 37/10/62 [phone]

## 2019-11-29 NOTE — ED Notes (Signed)
Call pts husband with update

## 2019-11-29 NOTE — Consult Note (Addendum)
Referring Provider:  [facility]         Primary Care Physician:  Gaynelle Arabian, MD Primary Gastroenterologist: Previously Dr. Olevia Perches / Dr. Loletha Carrow ( hospital).         We were asked to see this patient for:     Recurrent anemia, positive stool             ASSESSMENT /  PLAN    86 year old female with PMH significant for but not necessarily limited to CHF, CKD 3, hyperlipidemia, hypertension, CAD, aortic valve replacement , atrial fibrillation , chronic anticoagulation on warfarin, PUD, intestinal AVMs.   #  Worsening anemia / Heme + stool --Loose dark, heme + stool x 1 ( on iron).  Recent admission for GI bleed with findings of non-bleeding gastric ulcers, intestinal AVM s/p APC.  --Hemoglobin on 11/14/2019 was 7.3, down to 5.9.   -- Continue BID IV PPI --She has received 1 unit of packed red blood cells, another 1 ordered --Keep NPO for now. May need EGD at some point though may need to wait on INR to drift in case APC needed.    # PUD / H.pylori --Just started antibiotic regimen yesterday --Given hx of PUD will need to check for H.pylori eradication following antibiotics.   # AKI on CKD --Cr 1.64 >>> 3.22  # AFIB, hx of AVR. On chronic coumadin.  --INR 2.1 --Coumadin is on hold   HPI:    Chief Complaint:  diarrhea  ALLURA DOEPKE is a 86 y.o. female who we saw in the hospital last month for recurrent anemia (macrocytic) and dark but not black Hemoccult positive stool with one episode of frank rectal bleeding on warfarin.  2 nonbleeding gastric ulcers without stigmata of bleeding were found, 1 nonbleeding angiectasia in the jejunum was treated with APC.  Gastric biopsies compatible with chronic, active gastritis with H. pylori.  Antibiotic regimen was complicated given her need for warfarin and amiodarone.  She was ultimately referred to ID for antibiotic choices.  Yesterday she was started on quadruple therapy with doxycycline, levofloxacin, bismuth and Protonix.    Patient presented to the ED early this a.m. after an episode of explosive diarrhea last night.  She is on iron, stools are dark but not black.  INR 2.1 on warfarin.  Her hemoglobin has declined from 7.3 on 11/14/2019 to 5.9.  She had an episode of vomiting a couple days ago.  Thinks emesis was food, no dark emesis.  She has no abdominal pain.  In the ED she had fluctuating BP with systolic in the low 64P.  Today she has not had any further nausea, vomiting nor loose stool.  She is getting twice daily IV PPI   Previous GI Evaluations:   11/06/19 SBE Normal esophagus. - Non-bleeding gastric ulcer with no stigmata of bleeding. Biopsied. - Non-bleeding gastric ulcer with no stigmata of bleeding. - Normal examined duodenum. - A single non-bleeding angioectasia in the jejunum. Treated with argon plasma coagulation (APC). - One biopsy was obtained in the prepyloric region of the stomach.  FINAL MICROSCOPIC DIAGNOSIS:   A. STOMACH ULCER, ANTRUM, BIOPSY:  - Chronic active gastritis with reactive changes  - H. pylori organisms present  - No intestinal metaplasia identified  - See comment   B. STOMACH, ULCER, BIOPSY:  - Chronic active gastritis with reactive changes  - H. pylori organisms present  - No intestinal metaplasia identified  - See comment    04/20/25 flexible Sigmoidoscopy ENDOSCOPIC IMPRESSION: 1.  Small internal and external hemorrhoids 2. Otherwise normal sigmoidoscopy  02/13/13 colonoscopy.  Complete exam, good prep Blood in lumen interfering with visualization. Internal hemorrhoids.    Past Medical History:  Diagnosis Date  . Anemia   . Anginal pain ([place])   . Arteriovenous malformation of gastrointestinal tract   . Arthritis    "fingers mostly" (04/12/2015)  . Carotid artery disease ([place])    Carotid US 3/17:  56-25% RICA; 63-89% LICA; Elevated bilateral subclavian artery velocities >>f/u 1 year. // Carotid US 4/18: R 40-59; L 1-39 >> FU 1 year // Carotid US  10/2018: R 40-59; L 1-39, L subclavian stenosis   . CHF (congestive heart failure) ([place])   . Chronic kidney disease (CKD), stage III (moderate)   . Chronic lower GI bleeding    "today; last time was ~ 8 yr ago; used to have them often before that too" (02/11/2013)  . Coronary artery disease   . DJD (degenerative joint disease)   . Dysrhythmia   . Heart murmur   . History of blood transfusion    "a few times over the years; usually related to my Coumadin" (04/12/2015  . History of gout   . Hyperlipemia   . Hypertension   . Macula lutea degeneration   . Old MI (myocardial infarction)    "a coulple /dr in 02/2008; I never even knew I'd had them" (04/12/2015)  . Type II diabetes mellitus ([place])     Past Surgical History:  Procedure Laterality Date  . AORTIC VALVE REPLACEMENT  1993  . APPENDECTOMY  1953  . BIOPSY  11/06/2019   Procedure: BIOPSY;  Surgeon: Doran Stabler, MD;  Location: [facility];  Service: Gastroenterology;;  . CARDIAC CATHETERIZATION  [phone]  . CARDIAC CATHETERIZATION  04/12/2015   Procedure: Coronary Stent Intervention;  Surgeon: Jettie Booze, MD;    SYNERGY DES 3.5X16 to the ostial RCA   . CARDIAC CATHETERIZATION  04/12/2015   Procedure: Coronary/Graft Angiography;  Surgeon: Eloy End, MD; LAD & CFX 100%, patent LIMA-LAD, SVG-D1; SVG-OM-PDA first limb 100%, 2nd limb patent; oRCA 75%>0 w/ stent  . [place]   St. Jude/notes 10/29/2003 (02/11/2013)  . CARDIOVERSION N/A 08/15/2019   Procedure: CARDIOVERSION;  Surgeon: Acie Fredrickson Wonda Cheng, MD;  Location: [facility] ENDOSCOPY;  Service: Cardiovascular;  Laterality: N/A;  . COLONOSCOPY N/A 02/13/2013   Procedure: COLONOSCOPY;  Surgeon: Lafayette Dragon, MD;  Location: [facility] ENDOSCOPY;  Service: Endoscopy;  Laterality: N/A;  . CORONARY ANGIOPLASTY WITH STENT PLACEMENT  2009+   "3 at least; put in 1 stent at a time" (02/11/2013)  . CORONARY ARTERY BYPASS GRAFT  1990   LIMA-LAD, SVG-OM-PDA, SVG-D1  .  DILATION AND CURETTAGE OF UTERUS  1960's   'after a miscarriage" (02/11/2013)  . ENTEROSCOPY N/A 02/13/2013   Procedure: ENTEROSCOPY;  Surgeon: Lafayette Dragon, MD;  Location: [facility] ENDOSCOPY;  Service: Endoscopy;  Laterality: N/A;  . ENTEROSCOPY N/A 11/06/2019   Procedure: ENTEROSCOPY;  Surgeon: Doran Stabler, MD;  Location: [facility] ENDOSCOPY;  Service: Gastroenterology;  Laterality: N/A;  . EXPLORATORY LAPAROTOMY  02/24/2008   which revealed a retroperitoneal hematoma and bleeding from the right external iliac artery/notes 03/02/2008 (02/11/2013)   . FLEXIBLE SIGMOIDOSCOPY N/A 04/21/2015   Procedure: FLEXIBLE SIGMOIDOSCOPY;  Surgeon: Ladene Artist, MD;  Location: [facility] ENDOSCOPY;  Service: Endoscopy;  Laterality: N/A;  . HOT HEMOSTASIS N/A 11/06/2019   Procedure: HOT HEMOSTASIS (ARGON PLASMA COAGULATION/BICAP);  Surgeon: Doran Stabler, MD;  Location: [facility] ENDOSCOPY;  Service: Gastroenterology;  Laterality: N/A;  . TEE WITHOUT CARDIOVERSION N/A 12/01/2018   Procedure: TRANSESOPHAGEAL ECHOCARDIOGRAM (TEE);  Surgeon: Jerline Pain, MD;  Location: [facility] ENDOSCOPY;  Service: Cardiovascular;  Laterality: N/A;    Prior to Admission medications   Medication Sig Start Date End Date Taking? Authorizing Provider  amiodarone (PACERONE) 200 MG tablet Take 1 tablet (200 mg total) by mouth daily. 09/06/19  Yes Imogene Burn, PA-C  aspirin EC 81 MG tablet Take 81 mg by mouth daily.   Yes [provider]  atorvastatin (LIPITOR) 80 MG tablet Take 1 tablet by mouth once daily 09/19/19  Yes Jettie Booze, MD  bevacizumab (AVASTIN) 1.25 mg/0.1 mL SOLN Place 1.25 mg into the left eye every 8 (eight) weeks.   Yes [provider]  bismuth subsalicylate (PEPTO BISMOL) 262 MG chewable tablet Chew 2 tablets (524 mg total) by mouth 3 (three) times daily. 11/11/19  Yes Danis, Kirke Corin, MD  doxycycline (VIBRA-TABS) 100 MG tablet Take 1 tablet (100 mg total) by mouth 2 (two) times daily. 11/24/19  Yes  Michel Bickers, MD  ezetimibe (ZETIA) 10 MG tablet TAKE 1 TABLET BY MOUTH ONCE DAILY PLEASE  MAKE  OVERDUE  APPOINTMENT  WITH  DR  Irish Lack  BEFORE  ANYMORE  REFILLS Patient taking differently: Take 10 mg by mouth daily.  07/06/19  Yes Jettie Booze, MD  FeFum-FePo-FA-B Cmp-C-Zn-Mn-Cu (SE-TAN PLUS) 162-115.2-1 MG CAPS Take 1 capsule by mouth 2 (two) times daily.  05/22/15  Yes [provider]  folic acid (FOLVITE) 390 MCG tablet Take 400 mcg by mouth every evening.    Yes [provider]  furosemide (LASIX) 40 MG tablet Take 40 mg by mouth daily. 11/19/19  Yes [provider]  isosorbide mononitrate (IMDUR) 60 MG 24 hr tablet Take 1 tablet (60 mg total) by mouth daily. Patient taking differently: Take 120 mg by mouth daily.  07/07/19  Yes Varanasi, Charlann Lange, MD  JANTOVEN 5 MG tablet TAKE 1/2 TO 1 (ONE-HALF TO ONE) TABLET BY MOUTH ONCE DAILY AS  DIRECTED  BY  COUMADIN  CLINIC Patient taking differently: Take 2.5-5 mg by mouth See admin instructions. Take 2.5 mg by mouth at bedtime on Sun/Wed and 5 mg on Mon/Tues/Thurs/Fri/Sat 10/25/19  Yes Jettie Booze, MD  JANUVIA 50 MG tablet Take 50 mg by mouth daily.  06/14/19  Yes [provider]  levofloxacin (LEVAQUIN) 500 MG tablet Take 1 tablet (500 mg total) by mouth daily. 11/24/19  Yes Michel Bickers, MD  losartan (COZAAR) 50 MG tablet Take 50 mg by mouth daily.   Yes [provider]  metoprolol tartrate (LOPRESSOR) 25 MG tablet Take 25 mg by mouth 2 (two) times daily.   Yes [provider]  Multiple Vitamin (MULTIVITAMIN WITH MINERALS) TABS Take 1 tablet by mouth daily.   Yes [provider]  Multiple Vitamins-Minerals (PRESERVISION AREDS 2 PO) Take 1 tablet by mouth 2 (two) times daily.    Yes [provider]  nitroGLYCERIN (NITROSTAT) 0.4 MG SL tablet Place 1 tablet (0.4 mg total) under the tongue every 5 (five) minutes as needed for chest pain. Please make appt for  January. 1st attempt 09/07/17  Yes Jettie Booze, MD  Omega-3 Fatty Acids (FISH OIL PO) Take 2 capsules by mouth in the morning and at bedtime.    Yes [provider]  pantoprazole (PROTONIX) 40 MG tablet Take 1 tablet (40 mg total) by mouth 2 (  two) times daily before a meal. 11/09/19  Yes Harold Hedge, MD  traMADol (ULTRAM) 50 MG tablet Take 50 mg by mouth every 6 (six) hours as needed for moderate pain.   Yes [provider]  furosemide (LASIX) 20 MG tablet Take 1 tablet (20 mg total) by mouth daily. You may take an extra 20 mg as needed for 2-3 lb weight gain overnight Patient not taking: Reported on 11/29/2019 08/23/19   Imogene Burn, PA-C    Current Facility-Administered Medications  Medication Dose Route Frequency Provider Last Rate Last Admin  . pantoprazole (PROTONIX) injection 40 mg  40 mg Intravenous Q12H Karmen Bongo, MD      . sodium chloride flush (NS) 0.9 % injection 3 mL  3 mL Intravenous Once Delora Fuel, MD       Current Outpatient Medications  Medication Sig Dispense Refill  . amiodarone (PACERONE) 200 MG tablet Take 1 tablet (200 mg total) by mouth daily. 90 tablet 3  . aspirin EC 81 MG tablet Take 81 mg by mouth daily.    Marland Kitchen atorvastatin (LIPITOR) 80 MG tablet Take 1 tablet by mouth once daily 90 tablet 3  . bevacizumab (AVASTIN) 1.25 mg/0.1 mL SOLN Place 1.25 mg into the left eye every 8 (eight) weeks.    . bismuth subsalicylate (PEPTO BISMOL) 262 MG chewable tablet Chew 2 tablets (524 mg total) by mouth 3 (three) times daily. 84 tablet 0  . doxycycline (VIBRA-TABS) 100 MG tablet Take 1 tablet (100 mg total) by mouth 2 (two) times daily. 28 tablet 0  . ezetimibe (ZETIA) 10 MG tablet TAKE 1 TABLET BY MOUTH ONCE DAILY PLEASE  MAKE  OVERDUE  APPOINTMENT  WITH  DR  Irish Lack  BEFORE  ANYMORE  REFILLS (Patient taking differently: Take 10 mg by mouth daily. ) 90 tablet 3  . FeFum-FePo-FA-B Cmp-C-Zn-Mn-Cu (SE-TAN PLUS) 162-115.2-1 MG CAPS Take 1  capsule by mouth 2 (two) times daily.   3  . folic acid (FOLVITE) 518 MCG tablet Take 400 mcg by mouth every evening.     . furosemide (LASIX) 40 MG tablet Take 40 mg by mouth daily.    . isosorbide mononitrate (IMDUR) 60 MG 24 hr tablet Take 1 tablet (60 mg total) by mouth daily. (Patient taking differently: Take 120 mg by mouth daily. ) 90 tablet 3  . JANTOVEN 5 MG tablet TAKE 1/2 TO 1 (ONE-HALF TO ONE) TABLET BY MOUTH ONCE DAILY AS  DIRECTED  BY  COUMADIN  CLINIC (Patient taking differently: Take 2.5-5 mg by mouth See admin instructions. Take 2.5 mg by mouth at bedtime on Sun/Wed and 5 mg on Mon/Tues/Thurs/Fri/Sat) 90 tablet 0  . JANUVIA 50 MG tablet Take 50 mg by mouth daily.     Marland Kitchen levofloxacin (LEVAQUIN) 500 MG tablet Take 1 tablet (500 mg total) by mouth daily. 14 tablet 0  . losartan (COZAAR) 50 MG tablet Take 50 mg by mouth daily.    . metoprolol tartrate (LOPRESSOR) 25 MG tablet Take 25 mg by mouth 2 (two) times daily.    . Multiple Vitamin (MULTIVITAMIN WITH MINERALS) TABS Take 1 tablet by mouth daily.    . Multiple Vitamins-Minerals (PRESERVISION AREDS 2 PO) Take 1 tablet by mouth 2 (two) times daily.     . nitroGLYCERIN (NITROSTAT) 0.4 MG SL tablet Place 1 tablet (0.4 mg total) under the tongue every 5 (five) minutes as needed for chest pain. Please make appt for January. 1st attempt 25 tablet 0  . Omega-3  Fatty Acids (FISH OIL PO) Take 2 capsules by mouth in the morning and at bedtime.     . pantoprazole (PROTONIX) 40 MG tablet Take 1 tablet (40 mg total) by mouth 2 (two) times daily before a meal. 90 tablet 0  . traMADol (ULTRAM) 50 MG tablet Take 50 mg by mouth every 6 (six) hours as needed for moderate pain.    . furosemide (LASIX) 20 MG tablet Take 1 tablet (20 mg total) by mouth daily. You may take an extra 20 mg as needed for 2-3 lb weight gain overnight (Patient not taking: Reported on 11/29/2019) 90 tablet 3    Allergies as of 11/29/2019 - Review Complete 11/29/2019  Allergen  Reaction Noted  . Darvon [propoxyphene] Nausea And Vomiting 11/28/2013  . Lisinopril Cough 11/28/2013  . Septra [sulfamethoxazole-trimethoprim] Other (See Comments) 11/28/2013  . Warfarin and related Other (See Comments) 04/15/2015  . Penicillins Rash 04/10/2012    Family History  Problem Relation Age of Onset  . Heart disease Mother   . Heart disease Father     Social History   Socioeconomic History  . Marital status: Married    Spouse name: Not on file  . Number of children: Not on file  . Years of education: Not on file  . Highest education level: Not on file  Occupational History  . Not on file  Tobacco Use  . Smoking status: Never Smoker  . Smokeless tobacco: Never Used  Substance and Sexual Activity  . Alcohol use: No  . Drug use: No  . Sexual activity: Yes  Other Topics Concern  . Not on file  Social History Narrative  . Not on file   Social Determinants of Health   Financial Resource Strain:   . Difficulty of Paying Living Expenses: Not on file  Food Insecurity:   . Worried About Charity fundraiser in the Last Year: Not on file  . Ran Out of Food in the Last Year: Not on file  Transportation Needs:   . Lack of Transportation (Medical): Not on file  . Lack of Transportation (Non-Medical): Not on file  Physical Activity:   . Days of Exercise per Week: Not on file  . Minutes of Exercise per Session: Not on file  Stress:   . Feeling of Stress : Not on file  Social Connections:   . Frequency of Communication with Friends and Family: Not on file  . Frequency of Social Gatherings with Friends and Family: Not on file  . Attends Religious Services: Not on file  . Active Member of Clubs or Organizations: Not on file  . Attends Archivist Meetings: Not on file  . Marital Status: Not on file  Intimate Partner Violence:   . Fear of Current or Ex-Partner: Not on file  . Emotionally Abused: Not on file  . Physically Abused: Not on file  . Sexually  Abused: Not on file    Review of Systems: Right leg pain and swelling. All other systems reviewed and negative except where noted in HPI.  Physical Exam: Vital signs in last 24 hours: Temp:  [97.5 F (36.4 C)-98.6 F (37 C)] 97.8 F (36.6 C) (03/09 0707) Pulse Rate:  [51-64] 54 (03/09 0831) Resp:  [16-25] 17 (03/09 0900) BP: (83-107)/(36-55) 107/42 (03/09 0900) SpO2:  [98 %-100 %] 100 % (03/09 0831)   General:   Alert, pale, female in NAD Psych:  Pleasant, cooperative. Somewhat anxioud Eyes:  Pupils equal, sclera clear, no icterus.  Conjunctiva pink. Ears:  Normal auditory acuity. Nose:  No deformity, discharge,  or lesions. Neck:  Supple; no masses Lungs:  Clear throughout to auscultation.   No wheezes, crackles, or rhonchi.  Heart:  Regular rate, + mechanical valve click, 2+ RLE edema, 1+ LLE edema Abdomen:  Soft, non-distended, nontender, BS active, no palp mass   Rectal:  Deferred  Msk:  Symmetrical without gross deformities. . Neurologic:  Alert and  oriented x4;  grossly normal neurologically. Skin:  Intact without significant lesions or rashes.   Intake/Output from previous day: No intake/output data recorded. Intake/Output this shift: Total I/O In: 1000 [IV Piggyback:1000] Out: -   Lab Results: Recent Labs    11/29/19 0447  WBC 6.6  HGB 5.9*  HCT 20.9*  PLT 268   BMET Recent Labs    11/29/19 0447  NA 135  K 5.1  CL 102  CO2 17*  GLUCOSE 171*  BUN 57*  CREATININE 3.22*  CALCIUM 8.3*   LFT No results for input(s): PROT, ALBUMIN, AST, ALT, ALKPHOS, BILITOT, BILIDIR, IBILI in the last 72 hours. PT/INR Recent Labs    11/28/19 1156 11/29/19 0642  LABPROT  --  23.2*  INR 1.5* 2.1*   Hepatitis Panel No results for input(s): HEPBSAG, HCVAB, HEPAIGM, HEPBIGM in the last 72 hours.   . CBC Latest Ref Rng & Units 11/29/2019 11/14/2019 11/09/2019  WBC 4.0 - 10.5 K/uL 6.6 6.1 3.6(L)  Hemoglobin 12.0 - 15.0 g/dL 5.9(LL) 7.3(L) 7.3(L)  Hematocrit  36.0 - 46.0 % 20.9(L) 22.4(L) 23.3(L)  Platelets 150 - 400 K/uL 268 234 140(L)    . CMP Latest Ref Rng & Units 11/29/2019 11/09/2019 11/08/2019  Glucose 70 - 99 mg/dL 171(H) 92 113(H)  BUN 8 - 23 mg/dL 57(H) 36(H) 40(H)  Creatinine 0.44 - 1.00 mg/dL 3.22(H) 1.64(H) 1.82(H)  Sodium 135 - 145 mmol/L 135 137 138  Potassium 3.5 - 5.1 mmol/L 5.1 4.2 3.6  Chloride 98 - 111 mmol/L 102 104 106  CO2 22 - 32 mmol/L 17(L) 21(L) 22  Calcium 8.9 - 10.3 mg/dL 8.3(L) 8.2(L) 8.2(L)  Total Protein 6.5 - 8.1 g/dL - - -  Total Bilirubin 0.3 - 1.2 mg/dL - - -  Alkaline Phos 38 - 126 U/L - - -  AST 15 - 41 U/L - - -  ALT 0 - 44 U/L - - -   Studies/Results: No results found.  Active Problems:   * No active hospital problems. Tye Savoy, NP-C @  11/29/2019, 9:34 AM    Attending physician's note   I have taken a history, examined the patient and reviewed the chart. I agree with the Advanced Practitioner's note, impression and recommendations.  86 year old female with mechanical aortic valve replacement, A. fib on chronic anticoagulation with Coumadin  Recent enteroscopy with gastric ulcer, intestinal AVMs s/p APC and positive H. Pylori  Admitted with worsening anemia and heme positive stool  Plan for repeat EGD and placement of small bowel video capsule if no findings on EGD to evaluate source of GI bleed.  She could have additional AVMs in the distal small intestine that were beyond the reach of push enteroscopy, may need single double-balloon enteroscopy.  Continue to hold Coumadin, okay to start heparin GTT if INR goes down to subtherapeutic range.  Hold heparin 2 hours prior to the procedure  N.p.o. after midnight  Monitor hemoglobin and transfuse as needed to maintain greater than 7 PPI twice daily  The risks and benefits  as well as alternatives of endoscopic procedure(s) have been discussed and reviewed. All questions answered. The patient agrees to proceed.   Damaris Hippo  , MD [phone]

## 2019-11-29 NOTE — ED Notes (Signed)
Report called to 2W RN 

## 2019-11-30 ENCOUNTER — Encounter (HOSPITAL_COMMUNITY): Payer: Self-pay | Admitting: Certified Registered Nurse Anesthetist

## 2019-11-30 DIAGNOSIS — I5032 Chronic diastolic (congestive) heart failure: Secondary | ICD-10-CM

## 2019-11-30 LAB — GLUCOSE, CAPILLARY
Glucose-Capillary: 83 mg/dL (ref 70–99)
Glucose-Capillary: 100 mg/dL — ABNORMAL HIGH (ref 70–99)
Glucose-Capillary: 145 mg/dL — ABNORMAL HIGH (ref 70–99)
Glucose-Capillary: 161 mg/dL — ABNORMAL HIGH (ref 70–99)

## 2019-11-30 LAB — BPAM RBC
Blood Product Expiration Date: 202103192359
Blood Product Expiration Date: 202103212359
ISSUE DATE / TIME: 202103090639
ISSUE DATE / TIME: 202103090928
Unit Type and Rh: 9500
Unit Type and Rh: 9500

## 2019-11-30 LAB — TYPE AND SCREEN
ABO/RH(D): O NEG
Antibody Screen: NEGATIVE
Unit division: 0

## 2019-11-30 LAB — BASIC METABOLIC PANEL
Anion gap: 12 (ref 5–15)
BUN: 49 mg/dL — ABNORMAL HIGH (ref 8–23)
CO2: 17 mmol/L — ABNORMAL LOW (ref 22–32)
Calcium: 8.3 mg/dL — ABNORMAL LOW (ref 8.9–10.3)
Chloride: 107 mmol/L (ref 98–111)
Creatinine, Ser: 2.63 mg/dL — ABNORMAL HIGH (ref 0.44–1.00)
GFR calc Af Amer: 19 mL/min — ABNORMAL LOW (ref >60)
GFR calc non Af Amer: 16 mL/min — ABNORMAL LOW (ref >60)
Glucose, Bld: 91 mg/dL (ref 70–99)
Potassium: 4 mmol/L (ref 3.5–5.1)
Sodium: 136 mmol/L (ref 135–145)

## 2019-11-30 LAB — CBC
HCT: 26.7 % — ABNORMAL LOW (ref 36.0–46.0)
Hemoglobin: 8.5 g/dL — ABNORMAL LOW (ref 12.0–15.0)
MCH: 31 pg (ref 26.0–34.0)
MCHC: 31.8 g/dL (ref 30.0–36.0)
MCV: 97.4 fL (ref 80.0–100.0)
Platelets: 192 10*3/uL (ref 150–400)
RBC: 2.74 MIL/uL — ABNORMAL LOW (ref 3.87–5.11)
RDW: 22.5 % — ABNORMAL HIGH (ref 11.5–15.5)
WBC: 6.8 10*3/uL (ref 4.0–10.5)
nRBC: 0.3 % — ABNORMAL HIGH (ref 0.0–0.2)

## 2019-11-30 LAB — PROTIME-INR
INR: 3 — ABNORMAL HIGH (ref 0.8–1.2)
Prothrombin Time: 30.9 seconds — ABNORMAL HIGH (ref 11.4–15.2)

## 2019-11-30 LAB — HEMOGLOBIN AND HEMATOCRIT, BLOOD
HCT: 27 % — ABNORMAL LOW (ref 36.0–46.0)
Hemoglobin: 8.7 g/dL — ABNORMAL LOW (ref 12.0–15.0)

## 2019-11-30 MED ORDER — POLYETHYLENE GLYCOL 3350 17 GM/SCOOP PO POWD
0.5000 | Freq: Once | ORAL | Status: AC
  Administered 2019-11-30: 127.5 g via ORAL
  Filled 2019-11-30: qty 255

## 2019-11-30 MED ORDER — ISOSORBIDE MONONITRATE ER 60 MG PO TB24
60.0000 mg | ORAL_TABLET | Freq: Every day | ORAL | Status: DC
  Administered 2019-12-01 – 2019-12-03 (×3): 60 mg via ORAL
  Filled 2019-11-30 (×3): qty 1

## 2019-11-30 NOTE — Plan of Care (Signed)

## 2019-11-30 NOTE — Progress Notes (Signed)
Pt refused compression stockings, stating "they make swelling in my legs worst".

## 2019-11-30 NOTE — Progress Notes (Signed)
PROGRESS NOTE                                                                                                                                                                                                             Patient Demographics:    [name], is a 86 y.o. female, DOB - 01/16/1933, IBB:[id number]  Admit date - 11/29/2019   Admitting Physician Karmen Bongo, MD  Outpatient Primary MD for the patient is Gaynelle Arabian, MD  LOS - 1   Chief Complaint  Patient presents with  . Weakness       Brief Narrative    [name] is a 86 y.o. female with medical history significant of afib/AVR on Coumadin; stage 3 CKD; chronic diastolic CHF; CAD; HTN; and HLD presenting with melena.  She was last admitted from 2/11-17 for ABLA associated with GI bleeding.  She was transfused 2 units. 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  She was found to have H. Pylori which is also being treated.  Since her last admission, she lost control of her bowels last night.  She denies bleeding but had "feces everywhere" with brown stool.  She denies feeling lightheaded or dizzy.  She had a couple of days of very dark stools but she did not see a red tinge.  No abdominal pain (now or on prior admission).  Mildly SOB, has had heart problems for a couple of years and unchanged from baseline.  She does not wear home O2.   ED Course:  Carryover, per Dr. Myna Hidalgo:  She is being treated for [facility] pyori after recent admission with UGIB with gastric ulcer and H pylori. She is on coumadin for mechanical valve and presents with diarrhea. She denies abdominal pain, hematemesis, or frank blood in stool.   She reports recent melena, is found to have Hgb of 5.9 (from 7.3 on 11/14/19), AKI on CKD IV, INR 1.5, and is FOBT+.    She has 2 units RBC ordered.   MAP has remained <65. Does not appear that any IVF has been ordered in ED. She will be given a liter NS bolus and then reassessed. If BP improves with saline and blood,  she can be admitted to the stepdown unit.   Subjective:    [name] today has, No headache, No chest pain, No abdominal pain , no further bowel movements overnight, she denies any dyspnea as well.   Assessment  & Plan :    Principal Problem:   Gastrointestinal hemorrhage Active Problems:   H/O mechanical aortic valve replacement   Coronary Artery Disease   Type II diabetes mellitus with renal manifestations (HCC)   Alteration in anticoagulation   HTN (hypertension)   Stage 4 chronic kidney disease (HCC)   Acute kidney injury (nontraumatic) (HCC)   Chronic diastolic CHF (congestive heart failure) (HCC)  Acute blood loss anemia due to GI bleed -11/06/2019 small bowel enteroscopy showed none bleeding gastric ulcers, jejunal AVM, eradicated with APC.  Pathology revealed H. pylori positive gastritis. -GI input greatly appreciated, plan for endoscopy, INR is elevated today, rescheduled for tomorrow. -Used to units PRBC, monitor H&H closely, this morning at 8.5, will repeat at 5 PM and transfuse for hemoglobin less than 8.   H. pylori positive gastritis - ID, Dr Megan Salon recommended: Bismuth 2 tabs tid, doxycycline 100 mg bid, Levaquin 500 mg daily all for 2 weeks with ongoing Protonix 40 mg bid, started yesterday 3/9 and may be driving elevation of Coumadin. -Patient with recent hospitalization  AKI on stage 4 CKD -Likely associated with volume depletion associated with GI blood loss -Hold Cozaar, Lasix -Improving.  Paroxysmal atrial fibrillation -Continue amiodarone -Resumedcoumadin. Goal INR has been 2-2.5 as outpatient.   Status post prosthetic aortic valve replacement -Goal INR has been 2-2.5 as outpatient (usually 2.5-3.5 with AVR but lower due to h/o GI bleeding)    -Hold Coumadin for now, cardiology input greatly appreciated.  Type 2 diabetes -A1c is 3.8, will repeat level (3.8 likely in error given profound anemia at 5.9 and PRBC transfusion). -Hold Januvia -Will cover with sensitive-scale SSI  Hypertension -Hold Cozaar in the setting of AKI -Hold Lopressor due to ABLA and mild hypotension, likely able to resume 3/10  CAD -Hold aspirin for now -Continue Imdur  Hyperlipidemia -Continue Lipitor, Zetia  Chronic diastolic HF  -Preserved EF in 11/2018 -Hold Lasix  -She does have marked B LE edema, will place compression stockings in addition to SCDs    COVID-19 Labs  No results for input(s): DDIMER, FERRITIN, LDH, CRP in the last 72 hours.  Lab Results  Component Value Date   SARSCOV2NAA NEGATIVE 11/29/2019   [place] NEGATIVE 11/03/2019   [place] NEGATIVE 08/12/2019     Code Status : Full  Family Communication  : D/W patient  Disposition Plan  : Home   Barriers For Discharge : Remains with active GI bleed, will need endoscopy  Consults  :  GI , CArduiolgoy  Procedures  : None  DVT Prophylaxis  :  SCD, elevate INR  Lab Results  Component Value Date   PLT 192 11/30/2019    Antibiotics  :    Anti-infectives (From admission, onward)   Start     Dose/Rate Route Frequency Ordered Stop   11/29/19 1100  levofloxacin (LEVAQUIN) tablet 500 mg     500 mg Oral Every 48 hours 11/29/19 1015     11/29/19 1030  doxycycline (VIBRA-TABS) tablet 100 mg     100 mg Oral 2 times daily 11/29/19 1015          Objective:   Vitals:   11/29/19 2054 11/29/19 2100 11/30/19 0105 11/30/19 0826  BP: (!) 106/40 (!) 116/58 124/67 (!) 128/55  Pulse: 62  70 73  Resp:   18 (!) 22  Temp: 97.6 F (36.4 C)  98 F (36.7 C) 97.8 F (36.6 C)  TempSrc: Oral  Oral   SpO2: 99%  98% 100%    Wt Readings from Last 3 Encounters:  11/22/19 64.4 kg  11/14/19 64.8 kg  11/03/19 63.4 kg     Intake/Output Summary (Last 24 hours) at  11/30/2019 1129 Last data filed at 11/30/2019 1100 Gross per 24 hour  Intake 1420.09 ml  Output 400 ml  Net 1020.09 ml     Physical Exam  Awake Alert, Oriented X 3, No new F.N deficits, Normal affect Symmetrical Chest wall movement, Good air movement bilaterally, CTAB RRR, mechanical valve click +ve B.Sounds, Abd Soft, No tenderness, No rebound - guarding or rigidity. No Cyanosis, Clubbing or edema, No new Rash or bruise      Data Review:    CBC Recent Labs  Lab 11/29/19 0447 11/29/19 1651 11/30/19 0340  WBC 6.6 8.1 6.8  HGB 5.9* 9.5* 8.5*  HCT 20.9* 31.9* 26.7*  PLT 268 213 192  MCV 113.0* 103.6* 97.4  MCH 31.9 30.8 31.0  MCHC 28.2* 29.8* 31.8  RDW 17.2* 22.3* 22.5*    Chemistries  Recent Labs  Lab 11/29/19 0447 11/30/19 0340  NA 135 136  K 5.1 4.0  CL 102 107  CO2 17* 17*  GLUCOSE 171* 91  BUN 57* 49*  CREATININE 3.22* 2.63*  CALCIUM 8.3* 8.3*   ------------------------------------------------------------------------------------------------------------------ No results for input(s): CHOL, HDL, LDLCALC, TRIG, CHOLHDL, LDLDIRECT in the last 72 hours.  Lab Results  Component Value Date   HGBA1C 3.8 (L) 11/29/2019   ------------------------------------------------------------------------------------------------------------------ No results for input(s): TSH, T4TOTAL, T3FREE, THYROIDAB in the last 72 hours.  Invalid input(s): FREET3 ------------------------------------------------------------------------------------------------------------------ No results for input(s): VITAMINB12, FOLATE, FERRITIN, TIBC, IRON, RETICCTPCT in the last 72 hours.  Coagulation profile Recent Labs  Lab 11/28/19 1156 11/29/19 0642 11/30/19 0340  INR 1.5* 2.1* 3.0*    No results for input(s): DDIMER in the last 72 hours.  Cardiac Enzymes No results for input(s): CKMB, TROPONINI, MYOGLOBIN in the last 168 hours.  Invalid input(s):  CK ------------------------------------------------------------------------------------------------------------------    Component Value Date/Time   BNP 150.0 (H) 10/01/2018 2025    Inpatient Medications  Scheduled Meds: . amiodarone  200 mg Oral Daily  . atorvastatin  80 mg Oral Daily  . bismuth subsalicylate  854 mg Oral TID  . doxycycline  100 mg Oral BID  . ezetimibe  10 mg Oral Daily  . folic acid  1 mg Oral QPM  . insulin aspart  0-9 Units Subcutaneous TID WC  . isosorbide mononitrate  120 mg Oral Daily  . levofloxacin  500 mg Oral Q48H  . pantoprazole (PROTONIX) IV  40 mg Intravenous Q12H  . sodium chloride flush  3 mL Intravenous Q12H   Continuous Infusions: PRN Meds:.acetaminophen **OR** acetaminophen, LORazepam, ondansetron **OR** ondansetron (ZOFRAN) IV, traMADol  Micro Results Recent Results (from the past 240 hour(s))  SARS CORONAVIRUS 2 (TAT 6-24 HRS) Nasopharyngeal Nasopharyngeal Swab     Status: None   Collection Time: 11/29/19  5:35 AM   Specimen: Nasopharyngeal Swab  Result Value Ref Range Status   SARS Coronavirus 2 NEGATIVE NEGATIVE Final    Comment: (NOTE) SARS-CoV-2 target nucleic acids are NOT DETECTED. The SARS-CoV-2 RNA is generally detectable in upper and lower respiratory specimens during the acute phase of infection. Negative results do not preclude SARS-CoV-2 infection, do not rule out co-infections  with other pathogens, and should not be used as the sole basis for treatment or other patient management decisions. Negative results must be combined with clinical observations, patient history, and epidemiological information. The expected result is Negative. Fact Sheet for Patients: SugarRoll.be Fact Sheet for Healthcare Providers: https://www.woods-mathews.com/ This test is not yet approved or cleared by the [place] FDA and  has been authorized for detection and/or diagnosis of SARS-CoV-2 by FDA  under an Emergency Use Authorization (EUA). This EUA will remain  in effect (meaning this test can be used) for the duration of the COVID-19 declaration under Section 56 4(b)(1) of the Act, 21 U.S.C. section 360bbb-3(b)(1), unless the authorization is terminated or revoked sooner. Performed at [facility], [place] [address], [place], [place] [address]     Radiology Reports US Venous Img Lower Unilateral Right (DVT)  Result Date: 11/18/2019 CLINICAL DATA:  Right lower extremity pain and swelling. Shortness of breath. EXAM: RIGHT LOWER EXTREMITY VENOUS DOPPLER ULTRASOUND TECHNIQUE: Gray-scale sonography with compression, as well as color and duplex ultrasound, were performed to evaluate the deep venous system(s) from the level of the common femoral vein through the popliteal and proximal calf veins. COMPARISON:  None. FINDINGS: VENOUS Normal compressibility of the common femoral, superficial femoral, and popliteal veins, as well as the visualized calf veins. Visualized portions of profunda femoral vein and great saphenous vein unremarkable. No filling defects to suggest DVT on grayscale or color Doppler imaging. Doppler waveforms show normal direction of venous flow, normal respiratory phasicity and response to augmentation. Limited views of the contralateral common femoral vein are unremarkable. OTHER None. Limitations: none IMPRESSION: Negative for DVT. Electronically Signed   By: Inge Rise M.D.   On: 11/18/2019 14:47   DG Chest Portable 1 View  Result Date: 11/03/2019 CLINICAL DATA:  Weakness and shortness of breath. EXAM: PORTABLE CHEST 1 VIEW COMPARISON:  Radiograph 11/27/2018 FINDINGS: Stable mild cardiomegaly post CABG. Possible trace pleural effusions. No pulmonary edema. No focal airspace disease. No pneumothorax. No acute osseous abnormalities are seen. IMPRESSION: 1. Possible trace pleural effusions. 2. Stable cardiomegaly post CABG. Aortic Atherosclerosis (ICD10-I70.0).  Electronically Signed   By: Keith Rake M.D.   On: 11/03/2019 16:51      Phillips Climes M.D on 11/30/2019 at 11:29 AM  Between 7am to 7pm - Pager - [phone]  After 7pm go to [email or web address] - password [facility]  [facility] -  Office  [phone]

## 2019-11-30 NOTE — H&P (View-Only) (Signed)
Daily Rounding Note  11/30/2019, 10:03 AM  LOS: 1 day   SUBJECTIVE:   Chief complaint: Anemia.  FOBT positive.  Elevated INR. No complaints other than that she wants to be able to move around the room without having to ask/call for assistance.  Has not been walking in the hallways. IV fluids remain in place.  No BM since the night of her arrival 3/9.  OBJECTIVE:         Vital signs in last 24 hours:    Temp:  [97.6 F (36.4 C)-98 F (36.7 C)] 97.8 F (36.6 C) (03/10 0826) Pulse Rate:  [54-73] 73 (03/10 0826) Resp:  [16-22] 22 (03/10 0826) BP: (90-128)/(40-95) 128/55 (03/10 0826) SpO2:  [85 %-100 %] 100 % (03/10 0826) Last BM Date: 11/28/19 There were no vitals filed for this visit. General: Somewhat pale but looks well.  Alert, comfortable.  Looks younger than stated age. Heart: RRR. Chest: A few fine crackles in the right base.  No labored breathing or cough. Abdomen: Soft.  Not tender, not distended. Extremities: No CCE. Neuro/Psych: Alert.  Fully oriented.  No tremors or gross weakness.  Intake/Output from previous day: 03/09 0701 - 03/10 0700 In: 2237.2 [I.V.:640.2; Blood:597; IV [id number]] Out: 300 [Urine:300]  Intake/Output this shift: Total I/O In: 374.9 [I.V.:374.9] Out: 100 [Urine:100]  Lab Results: Recent Labs    11/29/19 0447 11/29/19 1651 11/30/19 0340  WBC 6.6 8.1 6.8  HGB 5.9* 9.5* 8.5*  HCT 20.9* 31.9* 26.7*  PLT 268 213 192   BMET Recent Labs    11/29/19 0447 11/30/19 0340  NA 135 136  K 5.1 4.0  CL 102 107  CO2 17* 17*  GLUCOSE 171* 91  BUN 57* 49*  CREATININE 3.22* 2.63*  CALCIUM 8.3* 8.3*   LFT No results for input(s): PROT, ALBUMIN, AST, ALT, ALKPHOS, BILITOT, BILIDIR, IBILI in the last 72 hours. PT/INR Recent Labs    11/29/19 0642 11/30/19 0340  LABPROT 23.2* 30.9*  INR 2.1* 3.0*   Scheduled Meds: . amiodarone  200 mg Oral Daily  . atorvastatin  80 mg  Oral Daily  . bismuth subsalicylate  563 mg Oral TID  . doxycycline  100 mg Oral BID  . ezetimibe  10 mg Oral Daily  . folic acid  1 mg Oral QPM  . insulin aspart  0-9 Units Subcutaneous TID WC  . isosorbide mononitrate  120 mg Oral Daily  . levofloxacin  500 mg Oral Q48H  . pantoprazole (PROTONIX) IV  40 mg Intravenous Q12H  . sodium chloride flush  3 mL Intravenous Q12H   Continuous Infusions: . lactated ringers 75 mL/hr at 11/30/19 0852   PRN Meds:.acetaminophen **OR** acetaminophen, LORazepam, ondansetron **OR** ondansetron (ZOFRAN) IV, traMADol   ASSESMENT:   *   FOBT positive, dark stool (caveat chronic oral iron in place) EGD planned for today postponed to tmrw due to INR 3.  11/06/2019 small bowel enteroscopy showed none bleeding gastric ulcers, jejunal AVM, eradicated with APC.  Pathology revealed H. pylori positive gastritis, ID, Dr Megan Salon recommended: Bismuth 2 tabs tid, doxycycline 100 mg bid, Levaquin 500 mg daily all for 2 weeks with ongoing Protonix 40 mg bid, started yesterday 3/9 and may be driving elevation of Coumadin.  *   Blood loss anemia.  Macrocytic but B12, Folate normal.   Hgb 5.9 >> 2 PRBCs >> 9.5 >> 8.5.    *    Chronic Coumadin for A. Fib, Mechanical  AVR.  INR unfortunately has gone from 2.1 >> 3 over last 24 hours.  This may be due to abx started  3/9.     PLAN   *   Postponing EGD, rescheduling this for tomorrow.  Also adding capsule endo to orders.   Hopefully INR will allow for diagnostic study and placement of capsule.  . Continuing abx for now.    *   Stopping the IVF, LR currently running at 75/hour.  She is not particularly hypotensive or tachycardic and this will make activity easier.   Ambulate in the hall every shift.  *   Resume diet: HH.    *   Pt inr in AM.  CBC in AM.    *   WARNING: BISMUTH LIKELY TO CAUSE DARK STOOLS.      Azucena Freed  11/30/2019, 10:03 AM Phone [phone]   Attending physician's note   I have taken  an interval history, reviewed the chart and examined the patient. I agree with the Advanced Practitioner's note, impression and recommendations.   Worsening anemia, Hemoccult positive Coumadin on hold, INR 3 this morning Plan for EGD, if negative for any potential source for GI blood loss we will plan to proceed with small bowel video capsule placement  Patient complains of leg cramping and pain, unable to sleep at night.  Will defer to [facility] for management of leg pain  The risks and benefits as well as alternatives of endoscopic procedure(s) have been discussed and reviewed. All questions answered. The patient agrees to proceed.  Damaris Hippo , MD [phone]

## 2019-11-30 NOTE — Plan of Care (Signed)
  Problem: Coping: Goal: Level of anxiety will decrease Outcome: Progressing Note: Husband at bedside. Pt able to appropriately express needs.    Problem: Elimination: Goal: Will not experience complications related to bowel motility Outcome: Progressing Goal: Will not experience complications related to urinary retention Outcome: Progressing

## 2019-11-30 NOTE — Progress Notes (Addendum)
Daily Rounding Note  11/30/2019, 10:03 AM  LOS: 1 day   SUBJECTIVE:   Chief complaint: Anemia.  FOBT positive.  Elevated INR. No complaints other than that she wants to be able to move around the room without having to ask/call for assistance.  Has not been walking in the hallways. IV fluids remain in place.  No BM since the night of her arrival 3/9.  OBJECTIVE:         Vital signs in last 24 hours:    Temp:  [97.6 F (36.4 C)-98 F (36.7 C)] 97.8 F (36.6 C) (03/10 0826) Pulse Rate:  [54-73] 73 (03/10 0826) Resp:  [16-22] 22 (03/10 0826) BP: (90-128)/(40-95) 128/55 (03/10 0826) SpO2:  [85 %-100 %] 100 % (03/10 0826) Last BM Date: 11/28/19 There were no vitals filed for this visit. General: Somewhat pale but looks well.  Alert, comfortable.  Looks younger than stated age. Heart: RRR. Chest: A few fine crackles in the right base.  No labored breathing or cough. Abdomen: Soft.  Not tender, not distended. Extremities: No CCE. Neuro/Psych: Alert.  Fully oriented.  No tremors or gross weakness.  Intake/Output from previous day: 03/09 0701 - 03/10 0700 In: 2237.2 [I.V.:640.2; Blood:597; IV [id number]] Out: 300 [Urine:300]  Intake/Output this shift: Total I/O In: 374.9 [I.V.:374.9] Out: 100 [Urine:100]  Lab Results: Recent Labs    11/29/19 0447 11/29/19 1651 11/30/19 0340  WBC 6.6 8.1 6.8  HGB 5.9* 9.5* 8.5*  HCT 20.9* 31.9* 26.7*  PLT 268 213 192   BMET Recent Labs    11/29/19 0447 11/30/19 0340  NA 135 136  K 5.1 4.0  CL 102 107  CO2 17* 17*  GLUCOSE 171* 91  BUN 57* 49*  CREATININE 3.22* 2.63*  CALCIUM 8.3* 8.3*   LFT No results for input(s): PROT, ALBUMIN, AST, ALT, ALKPHOS, BILITOT, BILIDIR, IBILI in the last 72 hours. PT/INR Recent Labs    11/29/19 0642 11/30/19 0340  LABPROT 23.2* 30.9*  INR 2.1* 3.0*   Scheduled Meds: . amiodarone  200 mg Oral Daily  . atorvastatin  80 mg  Oral Daily  . bismuth subsalicylate  950 mg Oral TID  . doxycycline  100 mg Oral BID  . ezetimibe  10 mg Oral Daily  . folic acid  1 mg Oral QPM  . insulin aspart  0-9 Units Subcutaneous TID WC  . isosorbide mononitrate  120 mg Oral Daily  . levofloxacin  500 mg Oral Q48H  . pantoprazole (PROTONIX) IV  40 mg Intravenous Q12H  . sodium chloride flush  3 mL Intravenous Q12H   Continuous Infusions: . lactated ringers 75 mL/hr at 11/30/19 0852   PRN Meds:.acetaminophen **OR** acetaminophen, LORazepam, ondansetron **OR** ondansetron (ZOFRAN) IV, traMADol   ASSESMENT:   *   FOBT positive, dark stool (caveat chronic oral iron in place) EGD planned for today postponed to tmrw due to INR 3.  11/06/2019 small bowel enteroscopy showed none bleeding gastric ulcers, jejunal AVM, eradicated with APC.  Pathology revealed H. pylori positive gastritis, ID, Dr Megan Salon recommended: Bismuth 2 tabs tid, doxycycline 100 mg bid, Levaquin 500 mg daily all for 2 weeks with ongoing Protonix 40 mg bid, started yesterday 3/9 and may be driving elevation of Coumadin.  *   Blood loss anemia.  Macrocytic but B12, Folate normal.   Hgb 5.9 >> 2 PRBCs >> 9.5 >> 8.5.    *    Chronic Coumadin for A. Fib, Mechanical  AVR.  INR unfortunately has gone from 2.1 >> 3 over last 24 hours.  This may be due to abx started  3/9.     PLAN   *   Postponing EGD, rescheduling this for tomorrow.  Also adding capsule endo to orders.   Hopefully INR will allow for diagnostic study and placement of capsule.  . Continuing abx for now.    *   Stopping the IVF, LR currently running at 75/hour.  She is not particularly hypotensive or tachycardic and this will make activity easier.   Ambulate in the hall every shift.  *   Resume diet: HH.    *   Pt inr in AM.  CBC in AM.    *   WARNING: BISMUTH LIKELY TO CAUSE DARK STOOLS.      Azucena Freed  11/30/2019, 10:03 AM Phone [phone]   Attending physician's note   I have taken  an interval history, reviewed the chart and examined the patient. I agree with the Advanced Practitioner's note, impression and recommendations.   Worsening anemia, Hemoccult positive Coumadin on hold, INR 3 this morning Plan for EGD, if negative for any potential source for GI blood loss we will plan to proceed with small bowel video capsule placement  Patient complains of leg cramping and pain, unable to sleep at night.  Will defer to [facility] for management of leg pain  The risks and benefits as well as alternatives of endoscopic procedure(s) have been discussed and reviewed. All questions answered. The patient agrees to proceed.  Damaris Hippo , MD [phone]

## 2019-11-30 NOTE — Progress Notes (Addendum)
Pt anxious, not able to sleep, Ativan PRN given. Per pt, she feels like this frequently at home.  Will continue to monitor pt.

## 2019-11-30 NOTE — Progress Notes (Signed)
Progress Note  Patient Name: [name] Date of Encounter: 11/30/2019  Primary Cardiologist: Larae Grooms, MD   Subjective   Did not wish to use compression stockings.  No shortness of breath no chest pain  Inpatient Medications    Scheduled Meds: . amiodarone  200 mg Oral Daily  . atorvastatin  80 mg Oral Daily  . bismuth subsalicylate  462 mg Oral TID  . doxycycline  100 mg Oral BID  . ezetimibe  10 mg Oral Daily  . folic acid  1 mg Oral QPM  . insulin aspart  0-9 Units Subcutaneous TID WC  . isosorbide mononitrate  120 mg Oral Daily  . levofloxacin  500 mg Oral Q48H  . pantoprazole (PROTONIX) IV  40 mg Intravenous Q12H  . sodium chloride flush  3 mL Intravenous Q12H   Continuous Infusions: . lactated ringers 75 mL/hr at 11/30/19 0852   PRN Meds: acetaminophen **OR** acetaminophen, LORazepam, ondansetron **OR** ondansetron (ZOFRAN) IV, traMADol   Vital Signs    Vitals:   11/29/19 2054 11/29/19 2100 11/30/19 0105 11/30/19 0826  BP: (!) 106/40 (!) 116/58 124/67 (!) 128/55  Pulse: 62  70 73  Resp:   18 (!) 22  Temp: 97.6 F (36.4 C)  98 F (36.7 C) 97.8 F (36.6 C)  TempSrc: Oral  Oral   SpO2: 99%  98% 100%    Intake/Output Summary (Last 24 hours) at 11/30/2019 0942 Last data filed at 11/30/2019 0852 Gross per 24 hour  Intake 1612.09 ml  Output 400 ml  Net 1212.09 ml   Last 3 Weights 11/22/2019 11/14/2019 11/03/2019  Weight (lbs) 142 lb 142 lb 12.8 oz 139 lb 12.4 oz  Weight (kg) 64.411 kg 64.774 kg 63.4 kg      Telemetry    No adverse arrhythmias- Personally Reviewed  ECG    No new- Personally Reviewed  Physical Exam   GEN: No acute distress.   Neck: No JVD Cardiac:  Sharp click S2 RRR, 1/6 systolic murmur, rubs, or gallops.  Respiratory: Clear to auscultation bilaterally. GI: Soft, nontender, non-distended  MS:  Mild lower extremity edema; No deformity. Neuro:  Nonfocal  Psych: Normal affect   Labs    High Sensitivity Troponin:     Recent Labs  Lab 11/04/19 0337 11/04/19 0453  TROPONINIHS 92* 116*      Chemistry Recent Labs  Lab 11/29/19 0447 11/30/19 0340  NA 135 136  K 5.1 4.0  CL 102 107  CO2 17* 17*  GLUCOSE 171* 91  BUN 57* 49*  CREATININE 3.22* 2.63*  CALCIUM 8.3* 8.3*  GFRNONAA 13* 16*  GFRAA 15* 19*  ANIONGAP 16* 12     Hematology Recent Labs  Lab 11/29/19 0447 11/29/19 1651 11/30/19 0340  WBC 6.6 8.1 6.8  RBC 1.85* 3.08* 2.74*  HGB 5.9* 9.5* 8.5*  HCT 20.9* 31.9* 26.7*  MCV 113.0* 103.6* 97.4  MCH 31.9 30.8 31.0  MCHC 28.2* 29.8* 31.8  RDW 17.2* 22.3* 22.5*  PLT 268 213 192    BNPNo results for input(s): BNP, PROBNP in the last 168 hours.   DDimer No results for input(s): DDIMER in the last 168 hours.   Radiology    No results found.  Cardiac Studies   Echocardiogram EF 65%  Patient Profile     86 y.o. female with mechanical aortic valve on Coumadin here with GI bleed  Assessment & Plan    Mechanical aortic valve -Holding Coumadin.  GI bleed. -Resume Coumadin when felt  safe to do so from GI perspective understanding that she is at high risk once again for rebleed. -I think it is reasonable for her to drift below therapeutic range until GI procedures have been performed given that this is a mechanical aortic valve which does allow [place] for a few days of no anticoagulation with less risk than a mechanical mitral valve for instance. -Goal INR usually is 2-2.5.  Paroxysmal atrial fibrillation -Amiodarone, rate controlled, junctional rhythm 50s  CAD post CABG -Aspirin is being held.  I would strongly encourage discontinuing aspirin and utilizing Coumadin only given her recent bleeding history.  Chronic diastolic heart failure -Minimal leg swelling.  Stable.  Cozaar being held because of AKI.  Lopressor held because of hypotension.  Isosorbide has been continued.  Candee Furbish, MD      For questions or updates, please contact [facility] Please consult  [email or web address] for contact info under        Signed, Candee Furbish, MD  11/30/2019, 9:42 AM

## 2019-12-01 ENCOUNTER — Encounter (HOSPITAL_COMMUNITY): Payer: Self-pay | Admitting: Internal Medicine

## 2019-12-01 ENCOUNTER — Inpatient Hospital Stay (HOSPITAL_COMMUNITY): Payer: Medicare Other | Admitting: Anesthesiology

## 2019-12-01 ENCOUNTER — Encounter (HOSPITAL_COMMUNITY)
Admission: EM | Disposition: A | Discharge status: Home / Self Care | Payer: Self-pay | Source: Home / Self Care | Attending: Internal Medicine

## 2019-12-01 ENCOUNTER — Inpatient Hospital Stay (HOSPITAL_COMMUNITY): Payer: Medicare Other

## 2019-12-01 DIAGNOSIS — K921 Melena: Secondary | ICD-10-CM

## 2019-12-01 DIAGNOSIS — K259 Gastric ulcer, unspecified as acute or chronic, without hemorrhage or perforation: Secondary | ICD-10-CM

## 2019-12-01 DIAGNOSIS — I10 Essential (primary) hypertension: Secondary | ICD-10-CM

## 2019-12-01 HISTORY — PX: ESOPHAGOGASTRODUODENOSCOPY (EGD) WITH PROPOFOL: SHX5813

## 2019-12-01 LAB — CBC
HCT: 25.6 % — ABNORMAL LOW (ref 36.0–46.0)
Hemoglobin: 8 g/dL — ABNORMAL LOW (ref 12.0–15.0)
MCH: 30.9 pg (ref 26.0–34.0)
MCHC: 31.3 g/dL (ref 30.0–36.0)
MCV: 98.8 fL (ref 80.0–100.0)
Platelets: 158 10*3/uL (ref 150–400)
RBC: 2.59 MIL/uL — ABNORMAL LOW (ref 3.87–5.11)
RDW: 21.7 % — ABNORMAL HIGH (ref 11.5–15.5)
WBC: 6.4 10*3/uL (ref 4.0–10.5)
nRBC: 0 % (ref 0.0–0.2)

## 2019-12-01 LAB — HEMOGLOBIN A1C
Hgb A1c MFr Bld: 4.4 % — ABNORMAL LOW (ref 4.8–5.6)
Mean Plasma Glucose: 79.58 mg/dL

## 2019-12-01 LAB — BASIC METABOLIC PANEL
Anion gap: 11 (ref 5–15)
BUN: 32 mg/dL — ABNORMAL HIGH (ref 8–23)
CO2: 19 mmol/L — ABNORMAL LOW (ref 22–32)
Calcium: 8.2 mg/dL — ABNORMAL LOW (ref 8.9–10.3)
Chloride: 107 mmol/L (ref 98–111)
Creatinine, Ser: 1.77 mg/dL — ABNORMAL HIGH (ref 0.44–1.00)
GFR calc Af Amer: 30 mL/min — ABNORMAL LOW (ref >60)
GFR calc non Af Amer: 26 mL/min — ABNORMAL LOW (ref >60)
Glucose, Bld: 99 mg/dL (ref 70–99)
Potassium: 3.8 mmol/L (ref 3.5–5.1)
Sodium: 137 mmol/L (ref 135–145)

## 2019-12-01 LAB — PROTIME-INR
INR: 3.1 — ABNORMAL HIGH (ref 0.8–1.2)
Prothrombin Time: 31.8 seconds — ABNORMAL HIGH (ref 11.4–15.2)

## 2019-12-01 LAB — GLUCOSE, CAPILLARY
Glucose-Capillary: 97 mg/dL (ref 70–99)
Glucose-Capillary: 100 mg/dL — ABNORMAL HIGH (ref 70–99)
Glucose-Capillary: 148 mg/dL — ABNORMAL HIGH (ref 70–99)

## 2019-12-01 SURGERY — CANCELLED PROCEDURE

## 2019-12-01 SURGERY — ESOPHAGOGASTRODUODENOSCOPY (EGD) WITH PROPOFOL
Anesthesia: Monitor Anesthesia Care

## 2019-12-01 MED ORDER — PANTOPRAZOLE SODIUM 40 MG PO TBEC
40.0000 mg | DELAYED_RELEASE_TABLET | Freq: Two times a day (BID) | ORAL | Status: DC
  Administered 2019-12-01 – 2019-12-03 (×4): 40 mg via ORAL
  Filled 2019-12-01 (×4): qty 1

## 2019-12-01 MED ORDER — PROPOFOL 10 MG/ML IV BOLUS
INTRAVENOUS | Status: DC | PRN
  Administered 2019-12-01 (×2): 20 mg via INTRAVENOUS
  Administered 2019-12-01: 10 mg via INTRAVENOUS
  Administered 2019-12-01: 50 mg via INTRAVENOUS

## 2019-12-01 MED ORDER — PHENYLEPHRINE 40 MCG/ML (10ML) SYRINGE FOR IV PUSH (FOR BLOOD PRESSURE SUPPORT)
PREFILLED_SYRINGE | INTRAVENOUS | Status: DC | PRN
  Administered 2019-12-01: 120 ug via INTRAVENOUS

## 2019-12-01 MED ORDER — METOPROLOL TARTRATE 25 MG PO TABS
25.0000 mg | ORAL_TABLET | Freq: Two times a day (BID) | ORAL | Status: DC
  Administered 2019-12-01 – 2019-12-03 (×5): 25 mg via ORAL
  Filled 2019-12-01 (×5): qty 1

## 2019-12-01 MED ORDER — LIDOCAINE 2% (20 MG/ML) 5 ML SYRINGE
INTRAMUSCULAR | Status: DC | PRN
  Administered 2019-12-01: 80 mg via INTRAVENOUS

## 2019-12-01 MED ORDER — LACTATED RINGERS IV SOLN: INTRAVENOUS | Status: DC

## 2019-12-01 MED ORDER — PROPOFOL 500 MG/50ML IV EMUL
INTRAVENOUS | Status: DC | PRN
  Administered 2019-12-01: 100 ug/kg/min via INTRAVENOUS

## 2019-12-01 SURGICAL SUPPLY — 16 items
BLOCK BITE 60FR ADLT L/F BLUE (MISCELLANEOUS) ×3 IMPLANT
ELECT REM PT RETURN 9FT ADLT (ELECTROSURGICAL)
ELECTRODE REM PT RTRN 9FT ADLT (ELECTROSURGICAL) IMPLANT
FORCEP RJ3 GP 1.8X160 W-NEEDLE (CUTTING FORCEPS) IMPLANT
FORCEPS BIOP RAD 4 LRG CAP 4 (CUTTING FORCEPS) IMPLANT
NDL SCLEROTHERAPY 25GX240 (NEEDLE) IMPLANT
NEEDLE SCLEROTHERAPY 25GX240 (NEEDLE) IMPLANT
PROBE APC STR FIRE (PROBE) IMPLANT
PROBE INJECTION GOLD (MISCELLANEOUS)
PROBE INJECTION GOLD 7FR (MISCELLANEOUS) IMPLANT
SNARE SHORT THROW 13M SML OVAL (MISCELLANEOUS) IMPLANT
SYR 50ML LL SCALE MARK (SYRINGE) IMPLANT
TOWEL COTTON PACK 4EA (MISCELLANEOUS) ×6 IMPLANT
TUBING ENDO SMARTCAP PENTAX (MISCELLANEOUS) ×6 IMPLANT
TUBING IRRIGATION ENDOGATOR (MISCELLANEOUS) ×3 IMPLANT
WATER STERILE IRR 1000ML POUR (IV SOLUTION) IMPLANT

## 2019-12-01 SURGICAL SUPPLY — 15 items

## 2019-12-01 NOTE — Progress Notes (Signed)
Progress Note  Patient Name: [name] Date of Encounter: 12/01/2019  Primary Cardiologist: Larae Grooms, MD   Subjective   Denies any chest pain shortness of breath  Inpatient Medications    Scheduled Meds: . amiodarone  200 mg Oral Daily  . atorvastatin  80 mg Oral Daily  . bismuth subsalicylate  154 mg Oral TID  . doxycycline  100 mg Oral BID  . ezetimibe  10 mg Oral Daily  . folic acid  1 mg Oral QPM  . insulin aspart  0-9 Units Subcutaneous TID WC  . isosorbide mononitrate  60 mg Oral Daily  . levofloxacin  500 mg Oral Q48H  . pantoprazole (PROTONIX) IV  40 mg Intravenous Q12H  . sodium chloride flush  3 mL Intravenous Q12H   Continuous Infusions:  PRN Meds: acetaminophen **OR** acetaminophen, LORazepam, ondansetron **OR** ondansetron (ZOFRAN) IV, traMADol   Vital Signs    Vitals:   11/30/19 0826 11/30/19 1706 11/30/19 2136 12/01/19 0822  BP: (!) 128/55 (!) 127/52 (!) 128/50 (!) 151/62  Pulse: 73 85 88 85  Resp: (!) 22 18  19   Temp: 97.8 F (36.6 C) 98.5 F (36.9 C) 97.6 F (36.4 C) (!) 97.3 F (36.3 C)  TempSrc:   Oral   SpO2: 100% 99% 100% 99%    Intake/Output Summary (Last 24 hours) at 12/01/2019 0926 Last data filed at 11/30/2019 1749 Gross per 24 hour  Intake 1407.3 ml  Output --  Net 1407.3 ml   Last 3 Weights 11/22/2019 11/14/2019 11/03/2019  Weight (lbs) 142 lb 142 lb 12.8 oz 139 lb 12.4 oz  Weight (kg) 64.411 kg 64.774 kg 63.4 kg      Telemetry    Currently appears to be sinus tachycardia 120s- Personally Reviewed  ECG    Sinus rhythm- Personally Reviewed  Physical Exam   GEN: No acute distress.   Neck: No JVD Cardiac: RRR, sharp S2 click no murmurs, rubs, or gallops.  Respiratory: Clear to auscultation bilaterally. GI: Soft, nontender, non-distended  MS: No edema; No deformity. Neuro:  Nonfocal  Psych: Normal affect   Labs    High Sensitivity Troponin:   Recent Labs  Lab 11/04/19 0337 11/04/19 0453  TROPONINIHS  92* 116*      Chemistry Recent Labs  Lab 11/29/19 0447 11/30/19 0340 12/01/19 0324  NA 135 136 137  K 5.1 4.0 3.8  CL 102 107 107  CO2 17* 17* 19*  GLUCOSE 171* 91 99  BUN 57* 49* 32*  CREATININE 3.22* 2.63* 1.77*  CALCIUM 8.3* 8.3* 8.2*  GFRNONAA 13* 16* 26*  GFRAA 15* 19* 30*  ANIONGAP 16* 12 11     Hematology Recent Labs  Lab 11/29/19 1651 11/29/19 1651 11/30/19 0340 11/30/19 1710 12/01/19 0324  WBC 8.1  --  6.8  --  6.4  RBC 3.08*  --  2.74*  --  2.59*  HGB 9.5*   < > 8.5* 8.7* 8.0*  HCT 31.9*   < > 26.7* 27.0* 25.6*  MCV 103.6*  --  97.4  --  98.8  MCH 30.8  --  31.0  --  30.9  MCHC 29.8*  --  31.8  --  31.3  RDW 22.3*  --  22.5*  --  21.7*  PLT 213  --  192  --  158   < > = values in this interval not displayed.    BNPNo results for input(s): BNP, PROBNP in the last 168 hours.   DDimer No  results for input(s): DDIMER in the last 168 hours.   Radiology    No results found.  Cardiac Studies   EF 2020 normal  Patient Profile     86 y.o. female with mechanical aortic valve, prior CABG, chronic diastolic heart failure, paroxysmal atrial fibrillation.  GI bleed.  Assessment & Plan    Mechanical aortic valve -Holding Coumadin -Assessing for GI bleed -Once again, since it is a mechanical aortic valve, we have a little bit more leeway on time of lack of anticoagulation as opposed to a mechanical mitral valve.  I would not be opposed to resuming Coumadin following GI procedure and allowing INR to drift up slowly to 2-2.5 without heparin bridge given her recent bleeding episode.  CAD post CABG -Aspirin held.  Would discontinue.  Use Coumadin only.  No angina.  Chronic diastolic heart failure -Lopressor and Cozaar currently on hold.  Isosorbide continued.  Minimal leg swelling.  Did not wish to use compression stockings.  Paroxysmal atrial fibrillation -On amiodarone 200 mg a day.  Continue.  Heart rate this morning 120 persistent, possibly sinus  tachycardia.  Given her rise in blood pressure currently 151/62, I will resume her home metoprolol.  Mildly elevated high-sensitivity troponin -Demand ischemia in the setting of her decreased hemoglobin and underlying CAD.    For questions or updates, please contact [facility] Please consult [email or web address] for contact info under        Signed, Candee Furbish, MD  12/01/2019, 9:26 AM

## 2019-12-01 NOTE — Anesthesia Postprocedure Evaluation (Signed)
Anesthesia Post Note  Patient: [name]  Procedure(s) Performed: ESOPHAGOGASTRODUODENOSCOPY (EGD) WITH PROPOFOL (N/A )     Patient location during evaluation: Endoscopy Anesthesia Type: MAC Level of consciousness: awake and alert Pain management: pain level controlled Vital Signs Assessment: post-procedure vital signs reviewed and stable Respiratory status: spontaneous breathing and respiratory function stable Cardiovascular status: stable Postop Assessment: no apparent nausea or vomiting Anesthetic complications: no    Last Vitals:  Vitals:   12/01/19 1331 12/01/19 1340  BP: (!) 92/38 (!) 117/49  Pulse: 62 63  Resp: 17 (!) 22  Temp: 36.8 C   SpO2: 98% 98%    Last Pain:  Vitals:   12/01/19 1331  TempSrc: Axillary  PainSc: 0-No pain                 Eziah Negro DANIEL

## 2019-12-01 NOTE — Interval H&P Note (Signed)
History and Physical Interval Note:  12/01/2019 12:29 PM  [name]  has presented today for surgery, with the diagnosis of anemia, FOBT +.  The various methods of treatment have been discussed with the patient and family. After consideration of risks, benefits and other options for treatment, the patient has consented to  Procedure(s): GIVENS CAPSULE STUDY (N/A) ESOPHAGOGASTRODUODENOSCOPY (EGD) WITH PROPOFOL (N/A) as a surgical intervention.  The patient's history has been reviewed, patient examined, no change in status, stable for surgery.  I have reviewed the patient's chart and labs.  Questions were answered to the patient's satisfaction.     Mareo Portilla

## 2019-12-01 NOTE — Transfer of Care (Signed)
Immediate Anesthesia Transfer of Care Note  Patient: [name]  Procedure(s) Performed: ESOPHAGOGASTRODUODENOSCOPY (EGD) WITH PROPOFOL (N/A )  Patient Location: Endoscopy Unit  Anesthesia Type:MAC  Level of Consciousness: drowsy  Airway & Oxygen Therapy: Patient Spontanous Breathing and Patient connected to nasal cannula oxygen  Post-op Assessment: Report given to RN and Post -op Vital signs reviewed and stable  Post vital signs: Reviewed and stable  Last Vitals:  Vitals Value Taken Time  BP 92/38 12/01/19 1331  Temp    Pulse 64 12/01/19 1334  Resp 21 12/01/19 1334  SpO2 98 % 12/01/19 1334  Vitals shown include unvalidated device data.  Last Pain:  Vitals:   12/01/19 1331  TempSrc:   PainSc: 0-No pain         Complications: No apparent anesthesia complications

## 2019-12-01 NOTE — Op Note (Signed)
[facility] Patient Name: [name] Procedure Date : 12/01/2019 MRN: [id number] Attending MD: Mauri Pole , MD Date of Birth: [birth year] CSN: [id number] Age: 86 Admit Type: Inpatient Procedure:                Upper GI endoscopy Indications:              Suspected upper gastrointestinal bleeding Providers:                Mauri Pole, MD, Benay Pillow, RN,                            William Dalton, Technician Referring MD:              Medicines:                Monitored Anesthesia Care Complications:            No immediate complications. Estimated Blood Loss:     Estimated blood loss: none. Procedure:                Pre-Anesthesia Assessment:                           - Prior to the procedure, a History and Physical                            was performed, and patient medications and                            allergies were reviewed. The patient's tolerance of                            previous anesthesia was also reviewed. The risks                            and benefits of the procedure and the sedation                            options and risks were discussed with the patient.                            All questions were answered, and informed consent                            was obtained. Prior Anticoagulants: The patient has                            taken no previous anticoagulant or antiplatelet                            agents. ASA Grade Assessment: III - A patient with                            severe systemic disease. After reviewing the risks  and benefits, the patient was deemed in                            satisfactory condition to undergo the procedure.                           After obtaining informed consent, the endoscope was                            passed under direct vision. Throughout the                            procedure, the patient's blood pressure, pulse, and        oxygen saturations were monitored continuously. The                            GIF-H190 ([id number]) Olympus gastroscope was                            introduced through the mouth, and advanced to the                            third part of duodenum. The upper GI endoscopy was                            accomplished without difficulty. The patient                            tolerated the procedure well. Scope In: Scope Out: Findings:      The Z-line was regular and was found 37 cm from the incisors.      The esophagus was normal.      Few non-obstructing non-bleeding superficial gastric ulcers of mild to       moderate severity with a clean ulcer base (Forrest Class III) were found       on the greater curvature of the stomach, possible pill gastritis. The       largest lesion was 10 mm in largest dimension. Adherent mucus present,       able to wash with water pump and suction, no active bleeding, heme or       visible vessel.      The examined duodenum was normal. Impression:               - Z-line regular, 37 cm from the incisors.                           - Normal esophagus.                           - Non-obstructing non-bleeding gastric ulcers with                            a clean ulcer base (Forrest Class III).                           - Normal examined  duodenum.                           - No specimens collected. Recommendation:           - Patient has a contact number available for                            emergencies. The signs and symptoms of potential                            delayed complications were discussed with the                            patient. Return to normal activities tomorrow.                            Written discharge instructions were provided to the                            patient.                           - Resume previous diet.                           - Continue present medications.                           -Monitor Hgb and  transfuse as needed                           - Use Protonix (pantoprazole) 40 mg PO BID for 2                            months.                           - Resume Coumadin (warfarin) at prior dose today.                            Refer to managing physician for further adjustment                            of therapy.                           - GI will sign off, available if have questions Procedure Code(s):        --- Professional ---                           [phone], Esophagogastroduodenoscopy, flexible,                            transoral; diagnostic, including collection of  specimen(s) by brushing or washing, when performed                            (separate procedure) Diagnosis Code(s):        --- Professional ---                           K25.9, Gastric ulcer, unspecified as acute or                            chronic, without hemorrhage or perforation CPT copyright 2019 [employer]. All rights reserved. The codes documented in this report are preliminary and upon coder review may  be revised to meet current compliance requirements. Mauri Pole, MD 12/01/2019 1:53:00 PM This report has been signed electronically. Number of Addenda: 0

## 2019-12-01 NOTE — Progress Notes (Signed)
PROGRESS NOTE                                                                                                                                                                                                             Patient Demographics:    [name], is a 86 y.o. female, DOB - 04/14/1933, KPT:[id number]  Admit date - 11/29/2019   Admitting Physician Karmen Bongo, MD  Outpatient Primary MD for the patient is Gaynelle Arabian, MD  LOS - 2   Chief Complaint  Patient presents with  . Weakness       Brief Narrative    [name] is a 86 y.o. female with medical history significant of afib/AVR on Coumadin; stage 3 CKD; chronic diastolic CHF; CAD; HTN; and HLD presenting with melena.  She was last admitted from 2/11-17 for ABLA associated with GI bleeding.  She was transfused 2 units. 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  She was found to have H. Pylori which is also being treated.  Since her last admission, she lost control of her bowels last night.  She denies bleeding but had "feces everywhere" with brown stool.  She denies feeling lightheaded or dizzy.  She had a couple of days of very dark stools but she did not see a red tinge.  No abdominal pain (now or on prior admission).  Mildly SOB, has had heart problems for a couple of years and unchanged from baseline.  She does not wear home O2.   ED Course:  Carryover, per Dr. Myna Hidalgo:  She is being treated for [facility] pyori after recent admission with UGIB with gastric ulcer and H pylori. She is on coumadin for mechanical valve and presents with diarrhea. She denies abdominal pain, hematemesis, or frank blood in stool.   She reports recent melena, is found to have Hgb of 5.9 (from 7.3 on 11/14/19), AKI on CKD IV, INR 1.5, and is FOBT+.    She has 2 units RBC ordered.   MAP has remained <65. Does not appear that any IVF has been ordered in ED. She will be given a liter NS bolus and then reassessed. If BP improves with saline and blood,  she can be admitted to the stepdown unit.   Subjective:    [name] today has, No headache, No chest pain, No abdominal pain , reports she had bowel movement yesterday, but she is unaware of the color of her BM .   Assessment  & Plan :    Principal Problem:   Gastrointestinal hemorrhage Active Problems:   H/O mechanical aortic valve replacement   Coronary Artery Disease   Type II diabetes mellitus with renal manifestations (HCC)   Alteration in anticoagulation   HTN (hypertension)   Stage 4 chronic kidney disease (HCC)   Acute kidney injury (nontraumatic) (HCC)   Chronic diastolic CHF (congestive heart failure) (HCC)  Acute blood loss anemia due to GI bleed -11/06/2019 small bowel enteroscopy showed none bleeding gastric ulcers, jejunal AVM, eradicated with APC.  Pathology revealed H. pylori positive gastritis. -GI input greatly appreciated, plan for endoscopy if no acute findings, then plan for small bowel video capsule placement. -She was transfused units PRBC, so far hemoglobin has been stable with no indication for further transfusion.  H. pylori positive gastritis - ID, Dr Megan Salon recommended: Bismuth 2 tabs tid, doxycycline 100 mg bid, Levaquin 500 mg daily all for 2 weeks with ongoing Protonix 40 mg bid, started yesterday 3/9 and may be driving elevation of Coumadin. -Patient with recent hospitalization  AKI on stage 4 CKD -Likely associated with volume depletion associated with GI blood loss -Hold Cozaar, Lasix -Improving, it is 1.77 today.  Paroxysmal atrial fibrillation -Continue amiodarone -Resumedcoumadin. Goal INR has been 2-2.5 as outpatient.  Warfarin currently on hold in the setting of GI bleed  Status post prosthetic aortic valve replacement -Goal  INR has been 2-2.5 as outpatient (usually 2.5-3.5 with AVR but lower due to h/o GI bleeding)  -Hold Coumadin for now, can tolerate few days with holding full anticoagulation, cardiology input greatly appreciated.  Type 2 diabetes -Initially A1c 3.8, repeat is 4.4, but this is likely inaccurate given she had severe anemia with blood transfusion on admission. -Hold Januvia -Will cover with sensitive-scale SSI  Hypertension -Hold Cozaar in the setting of AKI, continue with Lopressor  CAD -Hold aspirin for now, likely will discontinue on discharge, cardiology input appreciated -Continue Imdur  Hyperlipidemia -Continue Lipitor, Zetia  Chronic diastolic HF  -Preserved EF in 11/2018 -Hold Lasix  -She does have marked B LE edema, will place compression stockings in addition to SCDs  Patient with chronic pain in right lower extremity, has some +1 edema, venous Doppler -2/26 for VT, will obtain x-ray of right ankle and right knee.  COVID-19 Labs  No results for input(s): DDIMER, FERRITIN, LDH, CRP in the last 72 hours.  Lab Results  Component Value Date   SARSCOV2NAA NEGATIVE 11/29/2019   [place] NEGATIVE 11/03/2019   [place] NEGATIVE 08/12/2019     Code Status : Full  Family Communication  : D/W patient, none at bedside  Disposition Plan  : Home   Barriers For Discharge : Remains with active GI bleed, will need endoscopy  Consults  :  GI , C[place]duiol[place]oy  Procedures  : None  DVT Prophylaxis  :  SCD, elevate INR  Lab Results  Component Value Date   PLT 158 12/01/2019    Antibiotics  :    Anti-infectives (From admission, onward)   Start     Dose/Rate Route Frequency Ordered Stop   11/29/19 1100  [MAR Hold]  levofloxacin (LEVAQUIN) tablet 500 mg     (MAR Hold since Thu 12/01/2019 at 1228.Hold  Reason: Transfer to a Procedural area.)   500 mg Oral Every 48 hours 11/29/19 1015     11/29/19 1030  [MAR Hold]  doxycycline (VIBRA-TABS) tablet 100 mg     (MAR Hold  since Thu 12/01/2019 at 1228.Hold Reason: Transfer to a Procedural area.)   100 mg Oral 2 times daily 11/29/19 1015          Objective:   Vitals:   12/01/19 1031 12/01/19 1241 12/01/19 1331 12/01/19 1340  BP: (!) 135/55 (!) 154/99 (!) 92/38 (!) 117/49  Pulse: 81 70 62 63  Resp: 16 (!) 25 17 (!) 22  Temp:   98.2 F (36.8 C)   TempSrc:   Axillary   SpO2:  99% 98% 98%    Wt Readings from Last 3 Encounters:  11/22/19 64.4 kg  11/14/19 64.8 kg  11/03/19 63.4 kg     Intake/Output Summary (Last 24 hours) at 12/01/2019 1348 Last data filed at 12/01/2019 0935 Gross per 24 hour  Intake 873 ml  Output --  Net 873 ml     Physical Exam  Awake Alert, Oriented X 3, No new F.N deficits, Normal affect Symmetrical Chest wall movement, Good air movement bilaterally, CTAB RRR, mechanical valve click +ve B.Sounds, Abd Soft, No tenderness, No rebound - guarding or rigidity. No Cyanosis, Clubbing , right lower extremity edema   Data Review:    CBC Recent Labs  Lab 11/29/19 0447 11/29/19 1651 11/30/19 0340 11/30/19 1710 12/01/19 0324  WBC 6.6 8.1 6.8  --  6.4  HGB 5.9* 9.5* 8.5* 8.7* 8.0*  HCT 20.9* 31.9* 26.7* 27.0* 25.6*  PLT 268 213 192  --  158  MCV 113.0* 103.6* 97.4  --  98.8  MCH 31.9 30.8 31.0  --  30.9  MCHC 28.2* 29.8* 31.8  --  31.3  RDW 17.2* 22.3* 22.5*  --  21.7*    Chemistries  Recent Labs  Lab 11/29/19 0447 11/30/19 0340 12/01/19 0324  NA 135 136 137  K 5.1 4.0 3.8  CL 102 107 107  CO2 17* 17* 19*  GLUCOSE 171* 91 99  BUN 57* 49* 32*  CREATININE 3.22* 2.63* 1.77*  CALCIUM 8.3* 8.3* 8.2*   ------------------------------------------------------------------------------------------------------------------ No results for input(s): CHOL, HDL, LDLCALC, TRIG, CHOLHDL, LDLDIRECT in the last 72 hours.  Lab Results  Component Value Date   HGBA1C 4.4 (L) 12/01/2019    ------------------------------------------------------------------------------------------------------------------ No results for input(s): TSH, T4TOTAL, T3FREE, THYROIDAB in the last 72 hours.  Invalid input(s): FREET3 ------------------------------------------------------------------------------------------------------------------ No results for input(s): VITAMINB12, FOLATE, FERRITIN, TIBC, IRON, RETICCTPCT in the last 72 hours.  Coagulation profile Recent Labs  Lab 11/28/19 1156 11/29/19 0642 11/30/19 0340 12/01/19 0324  INR 1.5* 2.1* 3.0* 3.1*    No results for input(s): DDIMER in the last 72 hours.  Cardiac Enzymes No results for input(s): CKMB, TROPONINI, MYOGLOBIN in the last 168 hours.  Invalid input(s): CK ------------------------------------------------------------------------------------------------------------------    Component Value Date/Time   BNP 150.0 (H) 10/01/2018 2025    Inpatient Medications  Scheduled Meds: . [MAR Hold] amiodarone  200 mg Oral Daily  . [MAR Hold] atorvastatin  80 mg Oral Daily  . [MAR Hold] bismuth subsalicylate  681 mg Oral TID  . [MAR Hold] doxycycline  100 mg Oral BID  . [MAR Hold] ezetimibe  10 mg Oral Daily  . [MAR Hold] folic acid  1 mg Oral QPM  . [MAR Hold] insulin aspart  0-9 Units Subcutaneous TID WC  . [MAR Hold] isosorbide mononitrate  60 mg Oral Daily  . [MAR Hold] levofloxacin  500 mg Oral Q48H  . [MAR Hold] metoprolol tartrate  25 mg Oral BID  . [MAR Hold] pantoprazole (PROTONIX) IV  40 mg Intravenous Q12H  . [MAR Hold] sodium chloride flush  3 mL Intravenous Q12H   Continuous Infusions: . lactated ringers 20 mL/hr at 12/01/19 1247   PRN Meds:.[MAR Hold] acetaminophen **OR** [MAR Hold] acetaminophen, [MAR Hold] LORazepam, [MAR Hold] ondansetron **OR** [MAR Hold] ondansetron (ZOFRAN) IV, [MAR Hold] traMADol  Micro Results Recent Results (from the past 240 hour(s))  SARS CORONAVIRUS 2 (TAT 6-24 HRS)  Nasopharyngeal Nasopharyngeal Swab     Status: None   Collection Time: 11/29/19  5:35 AM   Specimen: Nasopharyngeal Swab  Result Value Ref Range Status   SARS Coronavirus 2 NEGATIVE NEGATIVE Final    Comment: (NOTE) SARS-CoV-2 target nucleic acids are NOT DETECTED. The SARS-CoV-2 RNA is generally detectable in upper and lower respiratory specimens during the acute phase of infection. Negative results do not preclude SARS-CoV-2 infection, do not rule out co-infections with other pathogens, and should not be used as the sole basis for treatment or other patient management decisions. Negative results must be combined with clinical observations, patient history, and epidemiological information. The expected result is Negative. Fact Sheet for Patients: SugarRoll.be Fact Sheet for Healthcare Providers: https://www.woods-mathews.com/ This test is not yet approved or cleared by the [place] FDA and  has been authorized for detection and/or diagnosis of SARS-CoV-2 by FDA under an Emergency Use Authorization (EUA). This EUA will remain  in effect (meaning this test can be used) for the duration of the COVID-19 declaration under Section 56 4(b)(1) of the Act, 21 U.S.C. section 360bbb-3(b)(1), unless the authorization is terminated or revoked sooner. Performed at [facility], [place] [address] [place], [place] [address]     Radiology Reports US Venous Img Lower Unilateral Right (DVT)  Result Date: 11/18/2019 CLINICAL DATA:  Right lower extremity pain and swelling. Shortness of breath. EXAM: RIGHT LOWER EXTREMITY VENOUS DOPPLER ULTRASOUND TECHNIQUE: Gray-scale sonography with compression, as well as color and duplex ultrasound, were performed to evaluate the deep venous system(s) from the level of the common femoral vein through the popliteal and proximal calf veins. COMPARISON:  None. FINDINGS: VENOUS Normal compressibility of the common  femoral, superficial femoral, and popliteal veins, as well as the visualized calf veins. Visualized portions of profunda femoral vein and great saphenous vein unremarkable. No filling defects to suggest DVT on grayscale or color Doppler imaging. Doppler waveforms show normal direction of venous flow, normal respiratory phasicity and response to augmentation. Limited views of the contralateral common femoral vein are unremarkable. OTHER None. Limitations: none IMPRESSION: Negative for DVT. Electronically Signed   By: Inge Rise M.D.   On: 11/18/2019 14:47   DG Chest Portable 1 View  Result Date: 11/03/2019 CLINICAL DATA:  Weakness and shortness of breath. EXAM: PORTABLE CHEST 1 VIEW COMPARISON:  Radiograph 11/27/2018 FINDINGS: Stable mild cardiomegaly post CABG. Possible trace pleural effusions. No pulmonary edema. No focal airspace disease. No pneumothorax. No acute osseous abnormalities are seen. IMPRESSION: 1. Possible trace pleural effusions. 2. Stable cardiomegaly post CABG. Aortic Atherosclerosis (ICD10-I70.0). Electronically Signed   By: Keith Rake M.D.   On: 11/03/2019 16:51      Phillips Climes M.D on 12/01/2019 at 1:48 PM  Between 7am to 7pm - Pager - [phone]  After 7pm go to [email or web address] - password [facility]  [facility] -  Office  [phone]

## 2019-12-01 NOTE — Interval H&P Note (Signed)
History and Physical Interval Note:  12/01/2019 1:10 PM  [name]  has presented today for surgery, with the diagnosis of anemia, FOBT +.  The various methods of treatment have been discussed with the patient and family. After consideration of risks, benefits and other options for treatment, the patient has consented to  Procedure(s): GIVENS CAPSULE STUDY (N/A) ESOPHAGOGASTRODUODENOSCOPY (EGD) WITH PROPOFOL (N/A) as a surgical intervention.  The patient's history has been reviewed, patient examined, no change in status, stable for surgery.  I have reviewed the patient's chart and labs.  Questions were answered to the patient's satisfaction.     [name]

## 2019-12-01 NOTE — Anesthesia Preprocedure Evaluation (Addendum)
Anesthesia Evaluation  Patient identified by MRN, date of birth, ID band Patient awake    Reviewed: Allergy & Precautions, NPO status , Patient's Chart, lab work & pertinent test results  History of Anesthesia Complications Negative for: history of anesthetic complications  Airway Mallampati: II  TM Distance: >3 FB Neck ROM: Full    Dental no notable dental hx. (+) Dental Advisory Given   Pulmonary neg pulmonary ROS,    Pulmonary exam normal        Cardiovascular hypertension, + angina + CAD, + Past MI and +CHF  Normal cardiovascular exam+ dysrhythmias      Neuro/Psych negative neurological ROS     GI/Hepatic Neg liver ROS, PUD,   Endo/Other  negative endocrine ROSdiabetes  Renal/GU Renal InsufficiencyRenal disease     Musculoskeletal negative musculoskeletal ROS (+)   Abdominal   Peds  Hematology negative hematology ROS (+)   Anesthesia Other Findings Day of surgery medications reviewed with the patient.  Reproductive/Obstetrics                            Anesthesia Physical Anesthesia Plan  ASA: III  Anesthesia Plan: MAC   Post-op Pain Management:    Induction:   PONV Risk Score and Plan: 2 and Propofol infusion and Treatment may vary due to age or medical condition  Airway Management Planned: Natural Airway  Additional Equipment:   Intra-op Plan:   Post-operative Plan: Extubation in OR  Informed Consent: I have reviewed the patients History and Physical, chart, labs and discussed the procedure including the risks, benefits and alternatives for the proposed anesthesia with the patient or authorized representative who has indicated his/her understanding and acceptance.     Dental advisory given  Plan Discussed with: Anesthesiologist and CRNA  Anesthesia Plan Comments:        Anesthesia Quick Evaluation                                  Anesthesia Evaluation   Patient identified by MRN, date of birth, ID band Patient awake    Reviewed: Allergy & Precautions, NPO status , Patient's Chart, lab work & pertinent test results  History of Anesthesia Complications Negative for: history of anesthetic complications  Airway Mallampati: II  TM Distance: >3 FB Neck ROM: Full    Dental  (+) Partial Lower, Partial Upper, Dental Advisory Given   Pulmonary neg recent URI,    breath sounds clear to auscultation       Cardiovascular hypertension, Pt. on medications (-) angina+ CAD, + Past MI and +CHF  + dysrhythmias Atrial Fibrillation + Valvular Problems/Murmurs  Rhythm:Irregular + Systolic Click 1. The left ventricle has hyperdynamic systolic function, with an  ejection fraction of >65%. The cavity size was normal.  2. The right ventricle has normal systolc function. The cavity was  normal. There is no increase in right ventricular wall thickness.  3. Mild mitral valve prolapse.  4. Mitral valve regurgitation is mild to moderate by color flow Doppler.  5. Tricuspid valve regurgitation is moderate.  6. Aortic valve regurgitation mild perivalvular leak.  7. Pulmonic valve regurgitation was not assessed by color flow Doppler.  8. There is evidence of plaque in the descending aorta.  9. No vegetations, no endocarditis.     Neuro/Psych negative neurological ROS  negative psych ROS   GI/Hepatic Neg liver ROS, ? GI bleed  Endo/Other  diabetes  Renal/GU CRFRenal disease     Musculoskeletal  (+) Arthritis ,   Abdominal   Peds  Hematology  (+) anemia ,   Anesthesia Other Findings   Reproductive/Obstetrics                             Anesthesia Physical Anesthesia Plan  ASA: III  Anesthesia Plan: MAC   Post-op Pain Management:    Induction: Intravenous  PONV Risk Score and Plan: 2 and Treatment may vary due to age or medical condition and Propofol infusion  Airway Management  Planned: Nasal Cannula  Additional Equipment: None  Intra-op Plan:   Post-operative Plan:   Informed Consent: I have reviewed the patients History and Physical, chart, labs and discussed the procedure including the risks, benefits and alternatives for the proposed anesthesia with the patient or authorized representative who has indicated his/her understanding and acceptance.     Dental advisory given  Plan Discussed with: CRNA and Surgeon  Anesthesia Plan Comments:         Anesthesia Quick Evaluation

## 2019-12-02 ENCOUNTER — Encounter: Payer: Self-pay | Admitting: *Deleted

## 2019-12-02 DIAGNOSIS — K254 Chronic or unspecified gastric ulcer with hemorrhage: Principal | ICD-10-CM

## 2019-12-02 LAB — CBC
HCT: 28.4 % — ABNORMAL LOW (ref 36.0–46.0)
Hemoglobin: 8.7 g/dL — ABNORMAL LOW (ref 12.0–15.0)
MCH: 30.6 pg (ref 26.0–34.0)
MCHC: 30.6 g/dL (ref 30.0–36.0)
MCV: 100 fL (ref 80.0–100.0)
Platelets: 206 10*3/uL (ref 150–400)
RBC: 2.84 MIL/uL — ABNORMAL LOW (ref 3.87–5.11)
RDW: 21.2 % — ABNORMAL HIGH (ref 11.5–15.5)
WBC: 7.7 10*3/uL (ref 4.0–10.5)
nRBC: 0 % (ref 0.0–0.2)

## 2019-12-02 LAB — GLUCOSE, CAPILLARY
Glucose-Capillary: 146 mg/dL — ABNORMAL HIGH (ref 70–99)
Glucose-Capillary: 125 mg/dL — ABNORMAL HIGH (ref 70–99)
Glucose-Capillary: 129 mg/dL — ABNORMAL HIGH (ref 70–99)
Glucose-Capillary: 127 mg/dL — ABNORMAL HIGH (ref 70–99)

## 2019-12-02 LAB — BASIC METABOLIC PANEL
Anion gap: 10 (ref 5–15)
BUN: 21 mg/dL (ref 8–23)
CO2: 20 mmol/L — ABNORMAL LOW (ref 22–32)
Calcium: 8.4 mg/dL — ABNORMAL LOW (ref 8.9–10.3)
Chloride: 107 mmol/L (ref 98–111)
Creatinine, Ser: 1.57 mg/dL — ABNORMAL HIGH (ref 0.44–1.00)
GFR calc Af Amer: 35 mL/min — ABNORMAL LOW (ref >60)
GFR calc non Af Amer: 30 mL/min — ABNORMAL LOW (ref >60)
Glucose, Bld: 163 mg/dL — ABNORMAL HIGH (ref 70–99)
Potassium: 4.3 mmol/L (ref 3.5–5.1)
Sodium: 137 mmol/L (ref 135–145)

## 2019-12-02 LAB — PROTIME-INR
INR: 2.6 — ABNORMAL HIGH (ref 0.8–1.2)
Prothrombin Time: 27.5 seconds — ABNORMAL HIGH (ref 11.4–15.2)

## 2019-12-02 MED ORDER — WARFARIN SODIUM 2.5 MG PO TABS
2.5000 mg | ORAL_TABLET | Freq: Once | ORAL | Status: AC
  Administered 2019-12-02: 2.5 mg via ORAL
  Filled 2019-12-02: qty 1

## 2019-12-02 MED ORDER — WARFARIN - PHARMACIST DOSING INPATIENT: Freq: Every day | Status: DC

## 2019-12-02 MED ORDER — FUROSEMIDE 40 MG PO TABS
40.0000 mg | ORAL_TABLET | Freq: Every day | ORAL | Status: DC
  Administered 2019-12-02 – 2019-12-03 (×2): 40 mg via ORAL
  Filled 2019-12-02 (×2): qty 1

## 2019-12-02 NOTE — Discharge Instructions (Signed)

## 2019-12-02 NOTE — Care Management Important Message (Signed)
Important Message  Patient Details  Name: [name] MRN: [id number] Date of Birth: 08/02/1933   Medicare Important Message Given:  Yes  Im provided   Orbie Pyo 12/02/2019, 3:12 PM

## 2019-12-02 NOTE — Progress Notes (Signed)
Occupational Therapy Evaluation Patient Details Name: [name] MRN: [id number] DOB: 27-Mar-1933 Today's Date: 12/02/2019    History of Present Illness Pt is a 86 y.o. female with medical history significant of afib/AVR on Coumadin; stage 3 CKD; chronic diastolic CHF; CAD; HTN; and HLD presenting with melena.  She was last admitted from 2/11-17 for ABLA associated with GI bleeding. Upon recent admission, pt lost control of her bowels. Pt being treated for H pyori.    Clinical Impression   Patient is kind and willing to participate.  She lives at home with husband and is independent at prior level without assistive devices.  Today patient was unsure of the day/month and was unable to tell me why she was recently in the hospital before this admission, though otherwise cognitively [facility]  Patient able to stand with min assist and walk with RW.  She stood at sink to complete grooming with min guard.  Patient was short of breath with mobility and educated on pursed lip breathing.  Will continue to follow acutely with OT.    Follow Up Recommendations  No OT follow up;Supervision - Intermittent    Equipment Recommendations  None recommended by OT    Recommendations for Other Services       Precautions / Restrictions Precautions Precautions: Fall Restrictions Weight Bearing Restrictions: No      Mobility Bed Mobility Overal bed mobility: Modified Independent             General bed mobility comments: mod I for increased time and HOB elevated supine>sit  Transfers Overall transfer level: Needs assistance Equipment used: Rolling walker (2 wheeled);None Transfers: Sit to/from Stand Sit to Stand: Min assist             Balance Overall balance assessment: Needs assistance Sitting-balance support: Feet supported Sitting balance-Leahy Scale: Good Sitting balance - Comments: Pt able to sit EOB comfortably without UE support   Standing balance support: Bilateral upper  extremity supported;During functional activity Standing balance-Leahy Scale: Fair Standing balance comment: Pt can stand without UE support briefly. Had one instance of small LOB but recovered herself quickly                        ADL either performed or assessed with clinical judgement   ADL Overall ADL's : Needs assistance/impaired Eating/Feeding: Independent;Sitting   Grooming: Min guard;Standing   Upper Body Bathing: Min guard;Standing   Lower Body Bathing: Minimal assistance;Sit to/from stand   Upper Body Dressing : Set up;Sitting   Lower Body Dressing: Minimal assistance;Sit to/from stand   Toilet Transfer: Ambulation;Regular Toilet;RW;Minimal assistance   Toileting- Clothing Manipulation and Hygiene: Minimal assistance;Sit to/from stand       Functional mobility during ADLs: Min guard       Vision         Perception     Praxis      Pertinent Vitals/Pain Pain Assessment: No/denies pain Faces Pain Scale: Hurts even more Pain Location: R leg (knee/ calf) Pain Descriptors / Indicators: Aching;Constant;Discomfort;Guarding;Other (Comment)(better with ambulation, worse at night) Pain Intervention(s): Limited activity within patient's tolerance;Monitored during session     Hand Dominance Right   Extremity/Trunk Assessment Upper Extremity Assessment Upper Extremity Assessment: Generalized weakness          Communication Communication Communication: No difficulties   Cognition Arousal/Alertness: Awake/alert Behavior During Therapy: WFL for tasks assessed/performed Overall Cognitive Status: Within Functional Limits for tasks assessed  General Comments: Pt is pleasant and communicate. She was unoriented to time, though. Pt's husband is present throughout tx.   General Comments  Patient became a little SOB with exertion.  SHe stated that just started happening today, though SpO2 normal throughout     Exercises Exercises: Other exercises Other Exercises Other Exercises: pursed lip breathing    Shoulder Instructions      Home Living Family/patient expects to be discharged to:: Private residence Living Arrangements: Spouse/significant other Available Help at Discharge: Family;Available 24 hours/day Type of Home: House Home Access: Stairs to enter [employer] of Steps: 3 Entrance Stairs-Rails: (filing cabinet to hold onto (garage entrance)) Home Layout: One level     Bathroom Shower/Tub: Walk-in shower;Tub/shower unit   [employer]: Standard     Home Equipment: Clinical cytogeneticist - 2 wheels   Additional Comments: Pt reports walking without AD, she has one at home from a previous surgery.      Prior Functioning/Environment Level of Independence: Independent        Comments: Pt drives. Pt performs ADLs, and cooking and cleaning.         OT Problem List: Decreased strength;Decreased activity tolerance;Impaired balance (sitting and/or standing)      OT Treatment/Interventions: Self-care/ADL training;Therapeutic exercise;Energy conservation;DME and/or AE instruction;Therapeutic activities;Patient/family education;Balance training    OT Goals(Current goals can be found in the care plan section) Acute Rehab OT Goals Patient Stated Goal: to go home OT Goal Formulation: With patient Time For Goal Achievement: 12/16/19 Potential to Achieve Goals: Good  OT Frequency: Min 3X/week   Barriers to D/C:            Co-evaluation              AM-PAC OT "6 Clicks" Daily Activity     Outcome Measure Help from another person eating meals?: None Help from another person taking care of personal grooming?: A Little Help from another person toileting, which includes using toliet, bedpan, or urinal?: A Little Help from another person bathing (including washing, rinsing, drying)?: A Little Help from another person to put on and taking off regular upper body  clothing?: A Little Help from another person to put on and taking off regular lower body clothing?: A Little 6 Click Score: 19   End of Session Equipment Utilized During Treatment: Gait belt;Rolling walker Nurse Communication: Mobility status  Activity Tolerance: Patient tolerated treatment well Patient left: in chair;with call bell/phone within reach;with chair alarm set;with family/visitor present  OT Visit Diagnosis: Unsteadiness on feet (R26.81);Muscle weakness (generalized) (M62.81)                Time: 6720-9470 OT Time Calculation (min): 15 min Charges:  OT General Charges $OT Visit: 1 Visit OT Evaluation $OT Eval Moderate Complexity: 1 [address] OTR/L   Phylliss Bob 12/02/2019, 2:10 PM

## 2019-12-02 NOTE — Progress Notes (Addendum)
ANTICOAGULATION CONSULT NOTE - Initial Consult  Pharmacy Consult:  Coumadin Indication:  Afib + mechanical AVR  Allergies  Allergen Reactions  . Darvon [Propoxyphene] Nausea And Vomiting  . Lisinopril Cough  . Septra [Sulfamethoxazole-Trimethoprim] Other (See Comments)    Increased INR  . Warfarin And Related Other (See Comments)    ONLY TOLERATES BRAND  . Penicillins Rash    Did it involve swelling of the face/tongue/throat, SOB, or low BP? No Did it involve sudden or severe rash/hives, skin peeling, or any reaction on the inside of your mouth or nose? No Did you need to seek medical attention at a hospital or doctor's office? No When did it last happen?15 years If all above answers are "NO", may proceed with cephalosporin use.    Vital Signs: Temp: 98.9 F (37.2 C) (03/12 0735) BP: 154/70 (03/12 1015) Pulse Rate: 79 (03/12 1015)  Labs: Recent Labs    11/30/19 0340 11/30/19 0340 11/30/19 1710 11/30/19 1710 12/01/19 0324 12/02/19 0317  HGB 8.5*   < > 8.7*   < > 8.0* 8.7*  HCT 26.7*   < > 27.0*  --  25.6* 28.4*  PLT 192  --   --   --  158 206  LABPROT 30.9*  --   --   --  31.8* 27.5*  INR 3.0*  --   --   --  3.1* 2.6*  CREATININE 2.63*  --   --   --  1.77* 1.57*   < > = values in this interval not displayed.    Estimated Creatinine Clearance: 22.4 mL/min (A) (by C-G formula based on SCr of 1.57 mg/dL (H)).   Medical History: Past Medical History:  Diagnosis Date  . Anemia   . Anginal pain ([place])   . Arteriovenous malformation of gastrointestinal tract   . Arthritis    "fingers mostly" (04/12/2015)  . Carotid artery disease ([place])    Carotid US 3/17:  54-62% RICA; 70-35% LICA; Elevated bilateral subclavian artery velocities >>f/u 1 year. // Carotid US 4/18: R 40-59; L 1-39 >> FU 1 year // Carotid US 10/2018: R 40-59; L 1-39, L subclavian stenosis   . CHF (congestive heart failure) ([place])   . Chronic kidney disease (CKD), stage III (moderate)   . Chronic  lower GI bleeding    "today; last time was ~ 8 yr ago; used to have them often before that too" (02/11/2013)  . Coronary artery disease   . DJD (degenerative joint disease)   . Dysrhythmia   . Heart murmur   . History of blood transfusion    "a few times over the years; usually related to my Coumadin" (04/12/2015  . History of gout   . Hyperlipemia   . Hypertension   . Macula lutea degeneration   . Old MI (myocardial infarction)    "a coulple /dr in 02/2008; I never even knew I'd had them" (04/12/2015)  . Type II diabetes mellitus (HCC)      Assessment: 89 YOF recently admitted with GIB and EGD that admission showed small non-bleeding gastric ulcer/angioectasia.  Patient also tested positive for H.pylori and started on treatment (Levaquin could increase the effectiveness of Coumadin).  She returned on 11/29/19 with melena and Coumadin has been on hold.  EGD this admit shows non-bleeding gastric ulcers and GI recommends to restart Coumadin.  INR trended down to slightly supra-therapeutic level today.  Anticipate INR will trend down further in AM since patient's last dose was on 11/28/19.  Home Coumadin  dose per 3/8 [facility] clinic note: 2.5mg  daily except 5mg  on Mon  Goal of Therapy:  INR 2 - 2.5 d/t hx GIB Monitor platelets by anticoagulation protocol: Yes   Plan:  Coumadin 2.5mg  PO today Daily PT / INR Monitor for bleeding  Mcgwire Dasaro D. Mina Marble, PharmD, BCPS, [place] 12/02/2019, 11:08 AM

## 2019-12-02 NOTE — Evaluation (Signed)
Physical Therapy Evaluation Patient Details Name: [name] MRN: [id number] DOB: 05/31/1933 Today's Date: 12/02/2019   History of Present Illness  Pt is a 86 y.o. female with medical history significant of afib/AVR on Coumadin; stage 3 CKD; chronic diastolic CHF; CAD; HTN; and HLD presenting with melena.  She was last admitted from 2/11-17 for ABLA associated with GI bleeding. Upon recent admission, pt lost control of her bowels. Pt being treated for H pyori.     Clinical Impression  Pt is very pleasant and husband is in room on entry. Pt reports significant constant R LE pain and edema. 2+ pitting edema on R and L distal LE. R LE x-ray demonstrates vascular calcification. Pt reports pain is improved upon ambulation (opposite of vascular claudication sx) and has worsened over the past 3-4 weeks. Pt reports hx of coronary artery bypasses in that leg with artery removal. Suspicious of lymphedema. MD contacted for further work-up. Pt previously ambulates without AD and is independent at home. Pt presents with decreased standing balance, decreased endurance, and LE weakness. Pt is unsteady when standing without AD and recommended to use RW at this time. Pt ambulates 83' with RW and light min A to min guard. Pt would benefit from outpatient services at this time as her husband is able to drive her and is available all day. PT will continue to follow pt acutely.    Follow Up Recommendations Outpatient PT    Equipment Recommendations  None recommended by PT       Precautions / Restrictions Precautions Precautions: Fall Restrictions Weight Bearing Restrictions: No      Mobility  Bed Mobility Overal bed mobility: Modified Independent             General bed mobility comments: mod I for increased time and HOB elevated supine>sit  Transfers Overall transfer level: Needs assistance Equipment used: Rolling walker (2 wheeled);None Transfers: Sit to/from Stand Sit to Stand: Min assist          General transfer comment: min assist for power up and steadying, sit<>stand once without AD and pt lost balance and slowly sat back down. Another sit<>stand with RW with cuing for hand placement  Ambulation/Gait Ambulation/Gait assistance: Min guard;Min assist Gait Distance (Feet): 80 Feet Assistive device: Rolling walker (2 wheeled) Gait Pattern/deviations: Step-through pattern;Decreased step length - right;Decreased step length - left;Decreased weight shift to right;Antalgic;Narrow base of support Gait velocity: decreased Gait velocity interpretation: 1.31 - 2.62 ft/sec, indicative of limited community ambulator General Gait Details: Pt is generally min guard with RW, min A with one turn for safetya and steadiness. Cuing for RW safety.         Balance Overall balance assessment: Needs assistance Sitting-balance support: Feet supported Sitting balance-Leahy Scale: Good Sitting balance - Comments: Pt able to sit EOB comfortably without UE support   Standing balance support: Bilateral upper extremity supported;During functional activity Standing balance-Leahy Scale: Fair Standing balance comment: Pt is able to stand without AD briefly without UE support however demonstrates unsteadiness. Improved balance with B UE support in which she is reliant on when ambulating. Single Leg Stance - Right Leg: 2 Single Leg Stance - Left Leg: 3           High Level Balance Comments: SLS 2 seconds on R foot with B UE support, unable to perform with unilateral support. SLS on L side for 3-4 seconds with B UE support.             Pertinent Vitals/Pain  Pain Assessment: Faces Faces Pain Scale: Hurts even more Pain Location: R leg (knee/ calf) Pain Descriptors / Indicators: Aching;Constant;Discomfort;Guarding;Other (Comment)(better with ambulation, worse at night) Pain Intervention(s): Limited activity within patient's tolerance;Monitored during session    [place]  expects to be discharged to:: Private residence Living Arrangements: Spouse/significant other Available Help at Discharge: Family;Available 24 hours/day Type of Home: House Home Access: Stairs to enter Entrance Stairs-Rails: (filing cabinet to hold onto (garage entrance)) Entrance Stairs-Number of Steps: 3 Home Layout: One level Home Equipment: Clinical cytogeneticist - 2 wheels Additional Comments: Pt reports walking without AD, she has one at home from a previous surgery.    Prior Function Level of Independence: Independent         Comments: Pt drives. Pt performs ADLs, and cooking and cleaning.         Extremity/Trunk Assessment   Upper Extremity Assessment Upper Extremity Assessment: Overall WFL for tasks assessed    Lower Extremity Assessment Lower Extremity Assessment: RLE deficits/detail;LLE deficits/detail RLE Deficits / Details: 3+/5 hip, knee, and ankle RLE Sensation: WNL LLE Deficits / Details: 4-/5 hip, knee, and ankle LLE Sensation: WNL       Communication   Communication: No difficulties  Cognition Arousal/Alertness: Awake/alert Behavior During Therapy: WFL for tasks assessed/performed Overall Cognitive Status: Within Functional Limits for tasks assessed                                 General Comments: Pt is pleasant and communicate. Pt's husband is present throughout tx.      General Comments General comments (skin integrity, edema, etc.): Pt is on room air. SpO2 at rest is 96%. Upon exertion pt becomes SOB however O2 does not drop below 94%. Cuing for pursed lip breathing    Exercises Other Exercises Other Exercises: pursed lip breathing Other Exercises: SLS at RW for balance assessment      PT Assessment Patient needs continued PT services  PT Problem List Decreased strength;Decreased activity tolerance;Decreased balance;Decreased mobility;Decreased coordination;Decreased knowledge of use of DME;Decreased safety awareness;Pain        PT Treatment Interventions DME instruction;Gait training;Stair training;Functional mobility training;Therapeutic activities;Therapeutic exercise;Balance training;Neuromuscular re-education;Patient/family education    PT Goals (Current goals can be found in the Care Plan section)  Acute Rehab PT Goals Patient Stated Goal: to go home and decrease pain PT Goal Formulation: With patient/family Time For Goal Achievement: 12/16/19 Potential to Achieve Goals: Good    Frequency Min 3X/week    AM-PAC PT "6 Clicks" Mobility  Outcome Measure Help needed turning from your back to your side while in a flat bed without using bedrails?: A Little Help needed moving from lying on your back to sitting on the side of a flat bed without using bedrails?: A Little Help needed moving to and from a bed to a chair (including a wheelchair)?: A Little Help needed standing up from a chair using your arms (e.g., wheelchair or bedside chair)?: A Little Help needed to walk in hospital room?: A Little Help needed climbing 3-5 steps with a railing? : A Lot 6 Click Score: 17    End of Session Equipment Utilized During Treatment: Gait belt Activity Tolerance: Patient tolerated treatment well Patient left: in chair;with call bell/phone within reach;with chair alarm set;with family/visitor present Nurse Communication: Mobility status PT Visit Diagnosis: Unsteadiness on feet (R26.81);Other abnormalities of gait and mobility (R26.89);Muscle weakness (generalized) (M62.81);Pain Pain - Right/Left: Right Pain - part  of body: Knee;Leg    Time: 4356-8616 PT Time Calculation (min) (ACUTE ONLY): 37 min   Charges:   PT Evaluation $PT Eval Moderate Complexity: 1 Mod PT Treatments $Gait Training: 8-22 mins        Jodelle Green, PT, DPT Acute Rehabilitation Services Office [phone]   Jodelle Green 12/02/2019, 11:34 AM

## 2019-12-02 NOTE — Progress Notes (Addendum)
PROGRESS NOTE        PATIENT DETAILS Name: [name] Age: 86 y.o. Sex: female Date of Birth: 02/18/1933 Admit Date: 11/29/2019 Admitting Physician Karmen Bongo, MD OZD:[id number], Herbie [place], MD  Brief Narrative: Patient is a 86 y.o. female with history of A. fib on anticoagulation, AVR with mechanical valve, stage IV CKD, chronic diastolic heart failure, HTN, HLD-who presented with melena and acute blood loss anemia.  Patient was recently admitted from 2/11-2/17 for GI bleeding with acute blood loss anemia as well.  Significant events: 3/9>> admit to [facility] for melena. 2/11-2/17>> admit for blood loss anemia, enteroscopy showed nonbleeding angiectasia in the jejunum.  H. pylori positive on biopsy.  Antimicrobial therapy: Doxy/Levaquin  Microbiology data: None  Procedures : 3/11>> EGD-nonbleeding gastric ulcers  Consults: GI, Cards  DVT Prophylaxis : Coumadin  Subjective: Lying comfortably in bed-brown-colored stool this morning.  Assessment/Plan: Upper GI bleeding with acute blood loss anemia: Melena has resolved-brown stools.  S/p EGD on 3/11-showed gastric ulcers.  On PPI-hemoglobin stable-she is s/p 2 units of PRBC transfusion on 3/9.  Pylori positive gastritis: On bismuth/doxycycline/Levaquin along with PPI.  This was apparently started on 3/9.  AKI on CKD stage IV: AKI hemodynamically mediated-improving with supportive care.  PAF: Continue amiodarone-resume Coumadin per pharmacy-goal INR 2-2.5.  AVR with mechanical valve: Coumadin held for GI bleeding-patient stable-have resumed starting today.  Given GI bleeding-we will allow gradual increase in INR without overlapping Lovenox/heparin.  Chronic diastolic heart failure: Has chronic lower extremity edema-resuming oral Lasix today  CAD s/p CABG: Cardiology recommending to discontinue aspirin-we will only be on Coumadin.  Minimally elevated troponin: Likely demand ischemia from  anemia-clinical scenario not consistent with ACS  HTN: Controlled-continue metoprolol, Imdur  DM-2: CBG stable-continue SSI-resume oral hypoglycemics on discharge.  Diet: Diet Order            Diet heart healthy/carb modified Room service appropriate? Yes; Fluid consistency: Thin  Diet effective now               Code Status: Full code   Family Communication: Called spouse at his cell phone-unable to leave a voicemail.  Disposition Plan: Home-may require home health-awaiting PT eval.  Potential discharge over the weekend.  Barriers to Discharge: Needs another 24 hours of inpatient monitoring given recurrent admission for GI bleeding and acute blood loss anemia.  Needs to be ambulated by PT/OT.   Antimicrobial agents: Anti-infectives (From admission, onward)   Start     Dose/Rate Route Frequency Ordered Stop   11/29/19 1100  levofloxacin (LEVAQUIN) tablet 500 mg     500 mg Oral Every 48 hours 11/29/19 1015     11/29/19 1030  doxycycline (VIBRA-TABS) tablet 100 mg     100 mg Oral 2 times daily 11/29/19 1015         Time spent: 25- minutes-Greater than 50% of this time was spent in counseling, explanation of diagnosis, planning of further management, and coordination of care.  MEDICATIONS: Scheduled Meds: . amiodarone  200 mg Oral Daily  . atorvastatin  80 mg Oral Daily  . bismuth subsalicylate  742 mg Oral TID  . doxycycline  100 mg Oral BID  . ezetimibe  10 mg Oral Daily  . folic acid  1 mg Oral QPM  . furosemide  40 mg Oral Daily  . insulin aspart  0-9 Units Subcutaneous  TID WC  . isosorbide mononitrate  60 mg Oral Daily  . levofloxacin  500 mg Oral Q48H  . metoprolol tartrate  25 mg Oral BID  . pantoprazole  40 mg Oral BID AC  . sodium chloride flush  3 mL Intravenous Q12H   Continuous Infusions: PRN Meds:.acetaminophen **OR** acetaminophen, LORazepam, ondansetron **OR** ondansetron (ZOFRAN) IV, traMADol   PHYSICAL EXAM: Vital signs: Vitals:    12/01/19 1611 12/01/19 2100 12/02/19 0735 12/02/19 1015  BP: (!) 152/62 (!) 105/42 (!) 157/61 (!) 154/70  Pulse: 70 85 81 79  Resp: 20  (!) 21   Temp: 98.2 F (36.8 C) (!) 97.5 F (36.4 C) 98.9 F (37.2 C)   TempSrc:  Oral    SpO2: 100% 98% 98%    There were no vitals filed for this visit. There is no height or weight on file to calculate BMI.   Gen Exam:Alert awake-not in any distress HEENT:atraumatic, normocephalic Chest: B/L clear to auscultation anteriorly PXT:G6Y6 regular-+ metallic click Abdomen:soft non tender, non distended Extremities:++ edema Neurology: Non focal Skin: no rash  I have personally reviewed following labs and imaging studies  LABORATORY DATA: CBC: Recent Labs  Lab 11/29/19 0447 11/29/19 0447 11/29/19 1651 11/30/19 0340 11/30/19 1710 12/01/19 0324 12/02/19 0317  WBC 6.6  --  8.1 6.8  --  6.4 7.7  HGB 5.9*   < > 9.5* 8.5* 8.7* 8.0* 8.7*  HCT 20.9*   < > 31.9* 26.7* 27.0* 25.6* 28.4*  MCV 113.0*  --  103.6* 97.4  --  98.8 100.0  PLT 268  --  213 192  --  158 206   < > = values in this interval not displayed.    Basic Metabolic Panel: Recent Labs  Lab 11/29/19 0447 11/30/19 0340 12/01/19 0324 12/02/19 0317  NA 135 136 137 137  K 5.1 4.0 3.8 4.3  CL 102 107 107 107  CO2 17* 17* 19* 20*  GLUCOSE 171* 91 99 163*  BUN 57* 49* 32* 21  CREATININE 3.22* 2.63* 1.77* 1.57*  CALCIUM 8.3* 8.3* 8.2* 8.4*    GFR: Estimated Creatinine Clearance: 22.4 mL/min (A) (by C-G formula based on SCr of 1.57 mg/dL (H)).  Liver Function Tests: No results for input(s): AST, ALT, ALKPHOS, BILITOT, PROT, ALBUMIN in the last 168 hours. No results for input(s): LIPASE, AMYLASE in the last 168 hours. No results for input(s): AMMONIA in the last 168 hours.  Coagulation Profile: Recent Labs  Lab 11/28/19 1156 11/29/19 0642 11/30/19 0340 12/01/19 0324 12/02/19 0317  INR 1.5* 2.1* 3.0* 3.1* 2.6*    Cardiac Enzymes: No results for input(s): CKTOTAL,  CKMB, CKMBINDEX, TROPONINI in the last 168 hours.  BNP (last 3 results) No results for input(s): PROBNP in the last 8760 hours.  Lipid Profile: No results for input(s): CHOL, HDL, LDLCALC, TRIG, CHOLHDL, LDLDIRECT in the last 72 hours.  Thyroid Function Tests: No results for input(s): TSH, T4TOTAL, FREET4, T3FREE, THYROIDAB in the last 72 hours.  Anemia Panel: No results for input(s): VITAMINB12, FOLATE, FERRITIN, TIBC, IRON, RETICCTPCT in the last 72 hours.  Urine analysis:    Component Value Date/Time   COLORURINE YELLOW 11/28/2018 0058   APPEARANCEUR Cloudy (A) 11/14/2019 1008   LABSPEC 1.014 11/28/2018 0058   PHURINE 6.0 11/28/2018 0058   GLUCOSEU Negative 11/14/2019 1008   HGBUR MODERATE (A) 11/28/2018 0058   BILIRUBINUR Negative 11/14/2019 1008   KETONESUR NEGATIVE 11/28/2018 0058   PROTEINUR Trace (A) 11/14/2019 1008   PROTEINUR 30 (A)  11/28/2018 0058   UROBILINOGEN 0.2 04/20/2008 2135   NITRITE Negative 11/14/2019 1008   NITRITE POSITIVE (A) 11/28/2018 0058   LEUKOCYTESUR Trace (A) 11/14/2019 1008   LEUKOCYTESUR MODERATE (A) 11/28/2018 0058    Sepsis Labs: Lactic Acid, Venous    Component Value Date/Time   LATICACIDVEN 1.8 11/28/2018 0143    MICROBIOLOGY: Recent Results (from the past 240 hour(s))  SARS CORONAVIRUS 2 (TAT 6-24 HRS) Nasopharyngeal Nasopharyngeal Swab     Status: None   Collection Time: 11/29/19  5:35 AM   Specimen: Nasopharyngeal Swab  Result Value Ref Range Status   SARS Coronavirus 2 NEGATIVE NEGATIVE Final    Comment: (NOTE) SARS-CoV-2 target nucleic acids are NOT DETECTED. The SARS-CoV-2 RNA is generally detectable in upper and lower respiratory specimens during the acute phase of infection. Negative results do not preclude SARS-CoV-2 infection, do not rule out co-infections with other pathogens, and should not be used as the sole basis for treatment or other patient management decisions. Negative results must be combined with  clinical observations, patient history, and epidemiological information. The expected result is Negative. Fact Sheet for Patients: SugarRoll.be Fact Sheet for Healthcare Providers: https://www.woods-mathews.com/ This test is not yet approved or cleared by the [place] FDA and  has been authorized for detection and/or diagnosis of SARS-CoV-2 by FDA under an Emergency Use Authorization (EUA). This EUA will remain  in effect (meaning this test can be used) for the duration of the COVID-19 declaration under Section 56 4(b)(1) of the Act, 21 U.S.C. section 360bbb-3(b)(1), unless the authorization is terminated or revoked sooner. Performed at [facility], [place] [address], [place], [place] [address]     RADIOLOGY STUDIES/RESULTS: DG Knee Right Port  Result Date: 12/01/2019 CLINICAL DATA:  Right knee pain. EXAM: PORTABLE RIGHT KNEE - 1-2 VIEW COMPARISON:  None. FINDINGS: No evidence of fracture, dislocation, or joint effusion. Mild narrowing of medial joint space is noted. Vascular calcifications are noted. Surgical clips are seen in the surrounding soft tissues. IMPRESSION: Mild degenerative joint disease is noted medially. No acute abnormality seen in the right knee. Electronically Signed   By: Marijo Conception M.D.   On: 12/01/2019 15:35   DG Ankle Right Port  Result Date: 12/01/2019 CLINICAL DATA:  Pain EXAM: PORTABLE RIGHT ANKLE - 2 VIEW COMPARISON:  04/10/2012 FINDINGS: There is no evidence of fracture, dislocation, or joint effusion. There is no evidence of arthropathy or other focal bone abnormality. Soft tissue edema. Vascular calcinosis. IMPRESSION: 1. No fracture or dislocation of the right ankle. Joint spaces are preserved. 2.  Soft tissue edema. Electronically Signed   By: Eddie Candle M.D.   On: 12/01/2019 15:35     LOS: 3 days   Oren Binet, MD  [facility]    To contact the attending provider between 7A-7P or the  covering provider during after hours 7P-7A, please log into the web site [email or web address] and access using universal [place] password for that web site. If you do not have the password, please call the hospital operator.  12/02/2019, 11:09 AM

## 2019-12-02 NOTE — Progress Notes (Signed)
Progress Note  Patient Name: [name] Date of Encounter: 12/02/2019  Primary Cardiologist: Larae Grooms, MD   Subjective   Feels fine, comfortable, no chest pain  Inpatient Medications    Scheduled Meds: . amiodarone  200 mg Oral Daily  . atorvastatin  80 mg Oral Daily  . bismuth subsalicylate  505 mg Oral TID  . doxycycline  100 mg Oral BID  . ezetimibe  10 mg Oral Daily  . folic acid  1 mg Oral QPM  . furosemide  40 mg Oral Daily  . insulin aspart  0-9 Units Subcutaneous TID WC  . isosorbide mononitrate  60 mg Oral Daily  . levofloxacin  500 mg Oral Q48H  . metoprolol tartrate  25 mg Oral BID  . pantoprazole  40 mg Oral BID AC  . sodium chloride flush  3 mL Intravenous Q12H   Continuous Infusions:  PRN Meds: acetaminophen **OR** acetaminophen, LORazepam, ondansetron **OR** ondansetron (ZOFRAN) IV, traMADol   Vital Signs    Vitals:   12/01/19 1400 12/01/19 1611 12/01/19 2100 12/02/19 0735  BP: 134/60 (!) 152/62 (!) 105/42 (!) 157/61  Pulse: 67 70 85 81  Resp: (!) 25 20  (!) 21  Temp:  98.2 F (36.8 C) (!) 97.5 F (36.4 C) 98.9 F (37.2 C)  TempSrc:   Oral   SpO2: 100% 100% 98% 98%    Intake/Output Summary (Last 24 hours) at 12/02/2019 0957 Last data filed at 12/02/2019 0815 Gross per 24 hour  Intake 640 ml  Output 250 ml  Net 390 ml   Last 3 Weights 11/22/2019 11/14/2019 11/03/2019  Weight (lbs) 142 lb 142 lb 12.8 oz 139 lb 12.4 oz  Weight (kg) 64.411 kg 64.774 kg 63.4 kg      Telemetry    A. fib rate controlled- Personally Reviewed  ECG    No new- Personally Reviewed  Physical Exam  Mechanical S2 click sharp. GEN: No acute distress.   Neck: No JVD Cardiac: RRR, no murmurs, rubs, or gallops.  Respiratory: Clear to auscultation bilaterally. GI: Soft, nontender, non-distended  MS: No edema; No deformity. Neuro:  Nonfocal  Psych: Normal affect   Labs    High Sensitivity Troponin:   Recent Labs  Lab 11/04/19 0337 11/04/19 0453    TROPONINIHS 92* 116*      Chemistry Recent Labs  Lab 11/30/19 0340 12/01/19 0324 12/02/19 0317  NA 136 137 137  K 4.0 3.8 4.3  CL 107 107 107  CO2 17* 19* 20*  GLUCOSE 91 99 163*  BUN 49* 32* 21  CREATININE 2.63* 1.77* 1.57*  CALCIUM 8.3* 8.2* 8.4*  GFRNONAA 16* 26* 30*  GFRAA 19* 30* 35*  ANIONGAP 12 11 10      Hematology Recent Labs  Lab 11/30/19 0340 11/30/19 0340 11/30/19 1710 12/01/19 0324 12/02/19 0317  WBC 6.8  --   --  6.4 7.7  RBC 2.74*  --   --  2.59* 2.84*  HGB 8.5*   < > 8.7* 8.0* 8.7*  HCT 26.7*   < > 27.0* 25.6* 28.4*  MCV 97.4  --   --  98.8 100.0  MCH 31.0  --   --  30.9 30.6  MCHC 31.8  --   --  31.3 30.6  RDW 22.5*  --   --  21.7* 21.2*  PLT 192  --   --  158 206   < > = values in this interval not displayed.    BNPNo results for input(s):  BNP, PROBNP in the last 168 hours.   DDimer No results for input(s): DDIMER in the last 168 hours.   Radiology    DG Knee Right Port  Result Date: 12/01/2019 CLINICAL DATA:  Right knee pain. EXAM: PORTABLE RIGHT KNEE - 1-2 VIEW COMPARISON:  None. FINDINGS: No evidence of fracture, dislocation, or joint effusion. Mild narrowing of medial joint space is noted. Vascular calcifications are noted. Surgical clips are seen in the surrounding soft tissues. IMPRESSION: Mild degenerative joint disease is noted medially. No acute abnormality seen in the right knee. Electronically Signed   By: Marijo Conception M.D.   On: 12/01/2019 15:35   DG Ankle Right Port  Result Date: 12/01/2019 CLINICAL DATA:  Pain EXAM: PORTABLE RIGHT ANKLE - 2 VIEW COMPARISON:  04/10/2012 FINDINGS: There is no evidence of fracture, dislocation, or joint effusion. There is no evidence of arthropathy or other focal bone abnormality. Soft tissue edema. Vascular calcinosis. IMPRESSION: 1. No fracture or dislocation of the right ankle. Joint spaces are preserved. 2.  Soft tissue edema. Electronically Signed   By: Eddie Candle M.D.   On: 12/01/2019  15:35    Cardiac Studies   Recent EF normal  Patient Profile     86 y.o. female mechanical aortic valve prior CAD CABG chronic diastolic heart failure minor lower extremity edema PAF GI bleed  Assessment & Plan    PAF -On amiodarone continue 200 mg a day.  I resumed home metoprolol yesterday.  Improved.  Mechanical aortic valve -Resuming Coumadin today.  But drift up gently since her recent GI bleed occurred. -Goal INR 2-2.5  CAD post CABG -Aspirin continue to hold.  Discontinue.  Use Coumadin only.  No angina.  Chronic diastolic heart failure -Okay with resuming p.o. Lasix.  Leg swelling noted.  Angiotensin receptor blocker currently on hold.  Mildly elevated high-sensitivity troponin -Demand ischemia in the setting of her decreased hemoglobin, anemia and underlying CAD.  This is not ACS.       For questions or updates, please contact [place] Please consult [email or web address] for contact info under        Signed, Candee Furbish, MD  12/02/2019, 9:57 AM

## 2019-12-03 LAB — CBC
HCT: 26.9 % — ABNORMAL LOW (ref 36.0–46.0)
Hemoglobin: 8.2 g/dL — ABNORMAL LOW (ref 12.0–15.0)
MCH: 30.5 pg (ref 26.0–34.0)
MCHC: 30.5 g/dL (ref 30.0–36.0)
MCV: 100 fL (ref 80.0–100.0)
Platelets: 167 10*3/uL (ref 150–400)
RBC: 2.69 MIL/uL — ABNORMAL LOW (ref 3.87–5.11)
RDW: 20 % — ABNORMAL HIGH (ref 11.5–15.5)
WBC: 6.6 10*3/uL (ref 4.0–10.5)
nRBC: 0 % (ref 0.0–0.2)

## 2019-12-03 LAB — BASIC METABOLIC PANEL
Anion gap: 9 (ref 5–15)
BUN: 22 mg/dL (ref 8–23)
CO2: 25 mmol/L (ref 22–32)
Calcium: 8 mg/dL — ABNORMAL LOW (ref 8.9–10.3)
Chloride: 103 mmol/L (ref 98–111)
Creatinine, Ser: 1.37 mg/dL — ABNORMAL HIGH (ref 0.44–1.00)
GFR calc Af Amer: 41 mL/min — ABNORMAL LOW (ref >60)
GFR calc non Af Amer: 35 mL/min — ABNORMAL LOW (ref >60)
Glucose, Bld: 116 mg/dL — ABNORMAL HIGH (ref 70–99)
Potassium: 3.6 mmol/L (ref 3.5–5.1)
Sodium: 137 mmol/L (ref 135–145)

## 2019-12-03 LAB — PROTIME-INR
INR: 1.9 — ABNORMAL HIGH (ref 0.8–1.2)
Prothrombin Time: 21.7 seconds — ABNORMAL HIGH (ref 11.4–15.2)

## 2019-12-03 LAB — GLUCOSE, CAPILLARY
Glucose-Capillary: 100 mg/dL — ABNORMAL HIGH (ref 70–99)
Glucose-Capillary: 148 mg/dL — ABNORMAL HIGH (ref 70–99)

## 2019-12-03 MED ORDER — JANTOVEN 5 MG PO TABS: ORAL_TABLET | ORAL | 0 refills | Status: DC

## 2019-12-03 MED ORDER — WARFARIN SODIUM 7.5 MG PO TABS
7.5000 mg | ORAL_TABLET | ORAL | Status: AC
  Administered 2019-12-03: 7.5 mg via ORAL
  Filled 2019-12-03: qty 1

## 2019-12-03 MED ORDER — PANTOPRAZOLE SODIUM 40 MG PO TBEC: 40.0000 mg | DELAYED_RELEASE_TABLET | Freq: Two times a day (BID) | ORAL | 1 refills | Status: DC

## 2019-12-03 NOTE — Progress Notes (Signed)
ANTICOAGULATION CONSULT NOTE - Follow Up Consult  Pharmacy Consult for Coumadin Indication: afib and mech AVR  Allergies  Allergen Reactions  . Darvon [Propoxyphene] Nausea And Vomiting  . Lisinopril Cough  . Septra [Sulfamethoxazole-Trimethoprim] Other (See Comments)    Increased INR  . Warfarin And Related Other (See Comments)    ONLY TOLERATES BRAND  . Penicillins Rash    Did it involve swelling of the face/tongue/throat, SOB, or low BP? No Did it involve sudden or severe rash/hives, skin peeling, or any reaction on the inside of your mouth or nose? No Did you need to seek medical attention at a hospital or doctor's office? No When did it last happen?15 years If all above answers are "NO", may proceed with cephalosporin use.    Patient Measurements:     Vital Signs: Temp: 97.8 F (36.6 C) (03/13 0800) Temp Source: Oral (03/13 0800) BP: 137/55 (03/13 0800) Pulse Rate: 67 (03/13 0800)  Labs: Recent Labs    12/01/19 0324 12/01/19 0324 12/02/19 0317 12/03/19 0249  HGB 8.0*   < > 8.7* 8.2*  HCT 25.6*  --  28.4* 26.9*  PLT 158  --  206 167  LABPROT 31.8*  --  27.5* 21.7*  INR 3.1*  --  2.6* 1.9*  CREATININE 1.77*  --  1.57* 1.37*   < > = values in this interval not displayed.    Estimated Creatinine Clearance: 25.6 mL/min (A) (by C-G formula based on SCr of 1.37 mg/dL (H)).   Assessment: Anticoag: Coumadin 5mg  daily except 2.5 on Wed/Sun PTA for Afib + mech AVR, INR 2.1 on admit 3/9 >> held d/t melena.  Resumed 3/12, INR 1.9 down.  Goal of Therapy:  INR 2-2.5 with GIB Monitor platelets by anticoagulation protocol: Yes   Plan:  No heparin bridge per [place] Coumadin 7.5mg  PO today Daily PT / INR   [name] S. [name], PharmD, BCPS Clinical Staff Pharmacist [email or web address] [name], [name] 12/03/2019,9:01 AM

## 2019-12-03 NOTE — Care Management (Signed)
Referral made to [facility] street for OP PT. Added to AVS. No other CM needs identified.

## 2019-12-03 NOTE — Discharge Summary (Addendum)
PATIENT DETAILS Name: [name] Age: 86 y.o. Sex: female Date of Birth: [birth year] MRN: [id number]. Admitting Physician: Karmen Bongo, MD LEX:[id number], Herbie [place], MD  Admit Date: 11/29/2019 Discharge date: 12/03/2019  Recommendations for Outpatient Follow-up:  1. Follow up with PCP in 1-2 weeks 2. Please obtain CMP/CBC in one week 3. Please ensure follow-up with cardiology and GI 4. PPI twice daily for 2 months per GI recommendations. 5. Needs close monitoring of Coumadin/INR level given that she has had recurrent GI bleeding-and that she is also on numerous medications that interact with Coumadin-see below. 6. Have asked patient to call Coumadin clinic on 3/15 for an INR check.  Admitted From:  Home  Disposition: Home-outpatient PT   Home Health: No  Equipment/Devices: None  Discharge Condition: Stable  CODE STATUS: FULL CODE  Diet recommendation:  Diet Order            Diet - low sodium heart healthy        Diet Carb Modified        Diet heart healthy/carb modified Room service appropriate? Yes; Fluid consistency: Thin  Diet effective now               Brief Summary: Patient is a 86 y.o. female with history of A. fib on anticoagulation, AVR with mechanical valve, stage IV CKD, chronic diastolic heart failure, HTN, HLD-who presented with melena and acute blood loss anemia.  Patient was recently admitted from 2/11-2/17 for GI bleeding with acute blood loss anemia as well.  Significant events: 3/9>> admit to [facility] for melena. 2/11-2/17>> admit for blood loss anemia, enteroscopy showed nonbleeding angiectasia in the jejunum.  H. pylori positive on biopsy.  Antimicrobial therapy: Doxy/Levaquin  Microbiology data: None  Procedures : 3/11>> EGD-nonbleeding gastric ulcers  Consults: GI, Cards  Brief Hospital Course: Upper GI bleeding with acute blood loss anemia: Melena has resolved-brown stools for the past 2-3 days.  S/p EGD on 3/11-showed  gastric ulcers.  On PPI-hemoglobin stable-she is s/p 2 units of PRBC transfusion on 3/9.  GI recommends twice daily PPI for 2 days.  No further recommendations-GI has signed off.  Pylori positive gastritis: On bismuth/doxycycline/Levaquin along with PPI.  Started by outpatient ID MD after discussion with pharmacy given that she is on Coumadin.  Continue as previously instructed.  AKI on CKD stage IV: AKI hemodynamically mediated-improving with supportive care.  PAF: Continue amiodarone-resume Coumadin per pharmacy-goal INR 2-2.5.  INR subtherapeutic at the time of discharge-d/w Dr. Verline Lema is to let INR slowly increase given that she had recurrent GI bleeding.  Discussed with pharmacy-who will give her 7.5 mg of Coumadin today-following which she will resume her usual regimen starting tomorrow  AVR with mechanical valve: Coumadin held for GI bleeding-patient stable-have resumed starting today.  Given GI bleeding-we will allow gradual increase in INR without overlapping Lovenox/heparin.INR subtherapeutic at the time of discharge-d/w with Dr. Verline Lema is to let INR slowly increase given that she had recurrent GI bleeding.  Patient was instructed to follow-up with the Coumadin clinic on 4/94  Chronic diastolic heart failure: Has chronic lower extremity edema-better this morning-continue Lasix on discharge  CAD s/p CABG: Cardiology recommending to discontinue aspirin-we will only be on Coumadin.  Minimally elevated troponin: Likely demand ischemia from anemia-clinical scenario not consistent with ACS  HTN: Controlled-continue metoprolol, Imdur.  Losartan on hold-resume when able.  DM-2: CBG stable-continue SSI-resume oral hypoglycemics on discharge.  Discharge Diagnoses:  Principal Problem:   Gastrointestinal hemorrhage Active Problems:   H/O  mechanical aortic valve replacement   Coronary Artery Disease   Type II diabetes mellitus with renal manifestations (HCC)   Alteration  in anticoagulation   HTN (hypertension)   Stage 4 chronic kidney disease (HCC)   Acute kidney injury (nontraumatic) (HCC)   Chronic diastolic CHF (congestive heart failure) ([place])   Discharge Instructions:  Activity:  As tolerated with Full fall precautions use walker/cane & assistance as needed   Discharge Instructions    (HEART FAILURE PATIENTS) Call MD:  Anytime you have any of the following symptoms: 1) 3 pound weight gain in 24 hours or 5 pounds in 1 week 2) shortness of breath, with or without a dry hacking cough 3) swelling in the hands, feet or stomach 4) if you have to sleep on extra pillows at night in order to breathe.   Complete by: As directed    Ambulatory referral to Physical Therapy   Complete by: As directed    86 y.o. female with history of A. fib on anticoagulation, AVR with mechanical valve, stage IV CKD, chronic diastolic heart failure, HTN, HLD-who presented with melena and acute blood loss anemia. Continues to have weakness, poor activity tolerance. Would benefit from outpatient PT.   Call MD for:   Complete by: As directed    Bloody or black tarry stools.  Or coffee colored emesis   Call MD for:  difficulty breathing, headache or visual disturbances   Complete by: As directed    Call MD for:  persistant nausea and vomiting   Complete by: As directed    Diet - low sodium heart healthy   Complete by: As directed    Diet Carb Modified   Complete by: As directed    Increase activity slowly   Complete by: As directed      Allergies as of 12/03/2019      Reactions   Darvon [propoxyphene] Nausea And Vomiting   Lisinopril Cough   Septra [sulfamethoxazole-trimethoprim] Other (See Comments)   Increased INR   Warfarin And Related Other (See Comments)   ONLY TOLERATES BRAND   Penicillins Rash   Did it involve swelling of the face/tongue/throat, SOB, or low BP? No Did it involve sudden or severe rash/hives, skin peeling, or any reaction on the inside of your mouth  or nose? No Did you need to seek medical attention at a hospital or doctor's office? No When did it last happen?15 years If all above answers are "NO", may proceed with cephalosporin use.      Medication List    STOP taking these medications   aspirin EC 81 MG tablet   losartan 50 MG tablet Commonly known as: COZAAR     TAKE these medications   amiodarone 200 MG tablet Commonly known as: Pacerone Take 1 tablet (200 mg total) by mouth daily.   atorvastatin 80 MG tablet Commonly known as: LIPITOR Take 1 tablet by mouth once daily   bevacizumab 1.25 mg/0.1 mL Soln Commonly known as: AVASTIN Place 1.25 mg into the left eye every 8 (eight) weeks.   bismuth subsalicylate 536 MG chewable tablet Commonly known as: PEPTO BISMOL Chew 2 tablets (524 mg total) by mouth 3 (three) times daily.   doxycycline 100 MG tablet Commonly known as: VIBRA-TABS Take 1 tablet (100 mg total) by mouth 2 (two) times daily.   ezetimibe 10 MG tablet Commonly known as: ZETIA TAKE 1 TABLET BY MOUTH ONCE DAILY PLEASE  MAKE  OVERDUE  APPOINTMENT  WITH  DR  Irish Lack  BEFORE  ANYMORE  REFILLS What changed: See the new instructions.   FISH OIL PO Take 2 capsules by mouth in the morning and at bedtime.   folic acid 563 MCG tablet Commonly known as: FOLVITE Take 400 mcg by mouth every evening.   furosemide 40 MG tablet Commonly known as: LASIX Take 40 mg by mouth daily.   isosorbide mononitrate 60 MG 24 hr tablet Commonly known as: IMDUR Take 1 tablet (60 mg total) by mouth daily. What changed: how much to take   Jantoven 5 MG tablet Generic drug: warfarin Take as directed. If you are unsure how to take this medication, talk to your nurse or doctor. Original instructions: TAKE 1/2 TO 1 (ONE-HALF TO ONE) TABLET BY MOUTH ONCE DAILY AS  DIRECTED  BY  COUMADIN  CLINIC Start taking on: December 04, 2019 What changed: See the new instructions.   Januvia 50 MG tablet Generic drug:  sitaGLIPtin Take 50 mg by mouth daily.   levofloxacin 500 MG tablet Commonly known as: LEVAQUIN Take 1 tablet (500 mg total) by mouth daily.   metoprolol tartrate 25 MG tablet Commonly known as: LOPRESSOR Take 25 mg by mouth 2 (two) times daily.   multivitamin with minerals Tabs tablet Take 1 tablet by mouth daily.   nitroGLYCERIN 0.4 MG SL tablet Commonly known as: NITROSTAT Place 1 tablet (0.4 mg total) under the tongue every 5 (five) minutes as needed for chest pain. Please make appt for January. 1st attempt   pantoprazole 40 MG tablet Commonly known as: PROTONIX Take 1 tablet (40 mg total) by mouth 2 (two) times daily before a meal.   PRESERVISION AREDS 2 PO Take 1 tablet by mouth 2 (two) times daily.   Se-Tan PLUS 162-115.2-1 MG Caps Take 1 capsule by mouth 2 (two) times daily.   traMADol 50 MG tablet Commonly known as: ULTRAM Take 50 mg by mouth every 6 (six) hours as needed for moderate pain.      Follow-up Information    [facility] Follow up.   Specialty: Rehabilitation Why: They will call you to set up an appointment next week. Contact information: [address] [phone] mc [place]       Gaynelle Arabian, MD. Schedule an appointment as soon as possible for a visit in 1 week(s).   Specialty: Family Medicine Contact information: [address] [place] [address] [phone]        Jettie Booze, MD. Schedule an appointment as soon as possible for a visit in 1 week(s).   Specialties: Cardiology, Radiology, Interventional Cardiology Contact information: [address] [address] [phone]        [facility] [employer]. Schedule an appointment as soon as possible for a visit on 12/05/2019.   Specialty: Cardiology Why: for INR check on monday Contact information: [address] [place] [address] [phone]         Allergies  Allergen Reactions  . Darvon [Propoxyphene] Nausea And Vomiting  . Lisinopril Cough  . Septra [Sulfamethoxazole-Trimethoprim] Other (See Comments)    Increased INR  . Warfarin And Related Other (See Comments)    ONLY TOLERATES BRAND  . Penicillins Rash    Did it involve swelling of the face/tongue/throat, SOB, or low BP? No Did it involve sudden or severe rash/hives, skin peeling, or any reaction on the inside of your mouth or nose? No Did you  need to seek medical attention at a hospital or doctor's office? No When did it last happen?15 years If all above answers are "NO", may proceed with cephalosporin use.      Other Procedures/Studies: US Venous Img Lower Unilateral Right (DVT)  Result Date: 11/18/2019 CLINICAL DATA:  Right lower extremity pain and swelling. Shortness of breath. EXAM: RIGHT LOWER EXTREMITY VENOUS DOPPLER ULTRASOUND TECHNIQUE: Gray-scale sonography with compression, as well as color and duplex ultrasound, were performed to evaluate the deep venous system(s) from the level of the common femoral vein through the popliteal and proximal calf veins. COMPARISON:  None. FINDINGS: VENOUS Normal compressibility of the common femoral, superficial femoral, and popliteal veins, as well as the visualized calf veins. Visualized portions of profunda femoral vein and great saphenous vein unremarkable. No filling defects to suggest DVT on grayscale or color Doppler imaging. Doppler waveforms show normal direction of venous flow, normal respiratory phasicity and response to augmentation. Limited views of the contralateral common femoral vein are unremarkable. OTHER None. Limitations: none IMPRESSION: Negative for DVT. Electronically Signed   By: Inge Rise M.D.   On: 11/18/2019 14:47   DG Chest Portable 1 View  Result Date: 11/03/2019 CLINICAL DATA:  Weakness and shortness of breath. EXAM: PORTABLE CHEST  1 VIEW COMPARISON:  Radiograph 11/27/2018 FINDINGS: Stable mild cardiomegaly post CABG. Possible trace pleural effusions. No pulmonary edema. No focal airspace disease. No pneumothorax. No acute osseous abnormalities are seen. IMPRESSION: 1. Possible trace pleural effusions. 2. Stable cardiomegaly post CABG. Aortic Atherosclerosis (ICD10-I70.0). Electronically Signed   By: Keith Rake M.D.   On: 11/03/2019 16:51   DG Knee Right Port  Result Date: 12/01/2019 CLINICAL DATA:  Right knee pain. EXAM: PORTABLE RIGHT KNEE - 1-2 VIEW COMPARISON:  None. FINDINGS: No evidence of fracture, dislocation, or joint effusion. Mild narrowing of medial joint space is noted. Vascular calcifications are noted. Surgical clips are seen in the surrounding soft tissues. IMPRESSION: Mild degenerative joint disease is noted medially. No acute abnormality seen in the right knee. Electronically Signed   By: Marijo Conception M.D.   On: 12/01/2019 15:35   DG Ankle Right Port  Result Date: 12/01/2019 CLINICAL DATA:  Pain EXAM: PORTABLE RIGHT ANKLE - 2 VIEW COMPARISON:  04/10/2012 FINDINGS: There is no evidence of fracture, dislocation, or joint effusion. There is no evidence of arthropathy or other focal bone abnormality. Soft tissue edema. Vascular calcinosis. IMPRESSION: 1. No fracture or dislocation of the right ankle. Joint spaces are preserved. 2.  Soft tissue edema. Electronically Signed   By: Eddie Candle M.D.   On: 12/01/2019 15:35     TODAY-DAY OF DISCHARGE:  Subjective:   Novalynn Branaman today has no headache,no chest abdominal pain,no new weakness tingling or numbness, feels much better wants to go home today.   Objective:   Blood pressure (!) 137/55, pulse 67, temperature 97.8 F (36.6 C), temperature source Oral, resp. rate 20, SpO2 99 %.  Intake/Output Summary (Last 24 hours) at 12/03/2019 1046 Last data filed at 12/02/2019 1717 Gross per 24 hour  Intake 600 ml  Output --  Net 600 ml   There were no  vitals filed for this visit.  Exam: Awake Alert, Oriented *3, No new F.N deficits, Normal affect [place].AT,PERRAL Supple Neck,No JVD, No cervical lymphadenopathy appriciated.  Symmetrical Chest wall movement, Good air movement bilaterally, CTAB RRR,No Gallops,Rubs or new Murmurs, No Parasternal Heave +ve B.Sounds, Abd Soft, Non tender, No organomegaly appriciated, No rebound -guarding or rigidity. No Cyanosis,  Clubbing or edema, No new Rash or bruise   PERTINENT RADIOLOGIC STUDIES: DG Knee Right Port  Result Date: 12/01/2019 CLINICAL DATA:  Right knee pain. EXAM: PORTABLE RIGHT KNEE - 1-2 VIEW COMPARISON:  None. FINDINGS: No evidence of fracture, dislocation, or joint effusion. Mild narrowing of medial joint space is noted. Vascular calcifications are noted. Surgical clips are seen in the surrounding soft tissues. IMPRESSION: Mild degenerative joint disease is noted medially. No acute abnormality seen in the right knee. Electronically Signed   By: Marijo Conception M.D.   On: 12/01/2019 15:35   DG Ankle Right Port  Result Date: 12/01/2019 CLINICAL DATA:  Pain EXAM: PORTABLE RIGHT ANKLE - 2 VIEW COMPARISON:  04/10/2012 FINDINGS: There is no evidence of fracture, dislocation, or joint effusion. There is no evidence of arthropathy or other focal bone abnormality. Soft tissue edema. Vascular calcinosis. IMPRESSION: 1. No fracture or dislocation of the right ankle. Joint spaces are preserved. 2.  Soft tissue edema. Electronically Signed   By: Eddie Candle M.D.   On: 12/01/2019 15:35     PERTINENT LAB RESULTS: CBC: Recent Labs    12/02/19 0317 12/03/19 0249  WBC 7.7 6.6  HGB 8.7* 8.2*  HCT 28.4* 26.9*  PLT 206 167   CMET CMP     Component Value Date/Time   NA 137 12/03/2019 0249   NA 140 09/27/2019 1150   K 3.6 12/03/2019 0249   CL 103 12/03/2019 0249   CO2 25 12/03/2019 0249   GLUCOSE 116 (H) 12/03/2019 0249   BUN 22 12/03/2019 0249   BUN 38 (H) 09/27/2019 1150   CREATININE 1.37  (H) 12/03/2019 0249   CREATININE 1.60 (H) 07/05/2015 1214   CALCIUM 8.0 (L) 12/03/2019 0249   PROT 5.3 (L) 11/03/2019 1539   PROT 6.7 06/29/2019 0953   ALBUMIN 3.2 (L) 11/03/2019 1539   ALBUMIN 4.4 06/29/2019 0953   AST 30 11/03/2019 1539   ALT 30 11/03/2019 1539   ALKPHOS 32 (L) 11/03/2019 1539   BILITOT 0.8 11/03/2019 1539   BILITOT 1.2 06/29/2019 0953   GFRNONAA 35 (L) 12/03/2019 0249   GFRAA 41 (L) 12/03/2019 0249    GFR Estimated Creatinine Clearance: 25.6 mL/min (A) (by C-G formula based on SCr of 1.37 mg/dL (H)). No results for input(s): LIPASE, AMYLASE in the last 72 hours. No results for input(s): CKTOTAL, CKMB, CKMBINDEX, TROPONINI in the last 72 hours. Invalid input(s): POCBNP No results for input(s): DDIMER in the last 72 hours. Recent Labs    12/01/19 0324  HGBA1C 4.4*   No results for input(s): CHOL, HDL, LDLCALC, TRIG, CHOLHDL, LDLDIRECT in the last 72 hours. No results for input(s): TSH, T4TOTAL, T3FREE, THYROIDAB in the last 72 hours.  Invalid input(s): FREET3 No results for input(s): VITAMINB12, FOLATE, FERRITIN, TIBC, IRON, RETICCTPCT in the last 72 hours. Coags: Recent Labs    12/02/19 0317 12/03/19 0249  INR 2.6* 1.9*   Microbiology: Recent Results (from the past 240 hour(s))  SARS CORONAVIRUS 2 (TAT 6-24 HRS) Nasopharyngeal Nasopharyngeal Swab     Status: None   Collection Time: 11/29/19  5:35 AM   Specimen: Nasopharyngeal Swab  Result Value Ref Range Status   SARS Coronavirus 2 NEGATIVE NEGATIVE Final    Comment: (NOTE) SARS-CoV-2 target nucleic acids are NOT DETECTED. The SARS-CoV-2 RNA is generally detectable in upper and lower respiratory specimens during the acute phase of infection. Negative results do not preclude SARS-CoV-2 infection, do not rule out co-infections with other pathogens, and should not  be used as the sole basis for treatment or other patient management decisions. Negative results must be combined with clinical  observations, patient history, and epidemiological information. The expected result is Negative. Fact Sheet for Patients: SugarRoll.be Fact Sheet for Healthcare Providers: https://www.woods-mathews.com/ This test is not yet approved or cleared by the [place] FDA and  has been authorized for detection and/or diagnosis of SARS-CoV-2 by FDA under an Emergency Use Authorization (EUA). This EUA will remain  in effect (meaning this test can be used) for the duration of the COVID-19 declaration under Section 56 4(b)(1) of the Act, 21 U.S.C. section 360bbb-3(b)(1), unless the authorization is terminated or revoked sooner. Performed at [facility], [place] [address] [place], [place] [address]     FURTHER DISCHARGE INSTRUCTIONS:  Get Medicines reviewed and adjusted: Please take all your medications with you for your next visit with your Primary MD  Laboratory/radiological data: Please request your Primary MD to go over all hospital tests and procedure/radiological results at the follow up, please ask your Primary MD to get all Hospital records sent to his/her office.  In some cases, they will be blood work, cultures and biopsy results pending at the time of your discharge. Please request that your primary care M.D. goes through all the records of your hospital data and follows up on these results.  Also Note the following: If you experience worsening of your admission symptoms, develop shortness of breath, life threatening emergency, suicidal or homicidal thoughts you must seek medical attention immediately by calling 911 or calling your MD immediately  if symptoms less severe.  You must read complete instructions/literature along with all the possible adverse reactions/side effects for all the Medicines you take and that have been prescribed to you. Take any new Medicines after you have completely understood and accpet all the possible adverse  reactions/side effects.   Do not drive when taking Pain medications or sleeping medications (Benzodaizepines)  Do not take more than prescribed Pain, Sleep and Anxiety Medications. It is not advisable to combine anxiety,sleep and pain medications without talking with your primary care practitioner  Special Instructions: If you have smoked or chewed Tobacco  in the last 2 yrs please stop smoking, stop any regular Alcohol  and or any Recreational drug use.  Wear Seat belts while driving.  Please note: You were cared for by a hospitalist during your hospital stay. Once you are discharged, your primary care physician will handle any further medical issues. Please note that NO REFILLS for any discharge medications will be authorized once you are discharged, as it is imperative that you return to your primary care physician (or establish a relationship with a primary care physician if you do not have one) for your post hospital discharge needs so that they can reassess your need for medications and monitor your lab values.  Total Time spent coordinating discharge including counseling, education and face to face time equals 35 minutes.  SignedOren Binet 12/03/2019 10:46 AM

## 2019-12-05 ENCOUNTER — Other Ambulatory Visit: Payer: Self-pay

## 2019-12-05 ENCOUNTER — Ambulatory Visit (INDEPENDENT_AMBULATORY_CARE_PROVIDER_SITE_OTHER): Payer: Medicare Other

## 2019-12-05 DIAGNOSIS — I359 Nonrheumatic aortic valve disorder, unspecified: Secondary | ICD-10-CM | POA: Diagnosis not present

## 2019-12-05 DIAGNOSIS — Z5181 Encounter for therapeutic drug level monitoring: Secondary | ICD-10-CM

## 2019-12-05 DIAGNOSIS — Z7901 Long term (current) use of anticoagulants: Secondary | ICD-10-CM | POA: Diagnosis not present

## 2019-12-05 LAB — POCT INR: INR: 2.4 (ref 2.0–3.0)

## 2019-12-05 NOTE — Patient Instructions (Signed)
Description   Start taking 1/2 tablet daily. Continues on Doxycycline and Levaquin 2 week course should complete 12/12/19.  Recheck INR in 1 week. Call our office if you have any problems or concerns [phone].

## 2019-12-09 ENCOUNTER — Other Ambulatory Visit: Payer: Self-pay

## 2019-12-09 ENCOUNTER — Encounter: Payer: Self-pay | Admitting: Gastroenterology

## 2019-12-09 ENCOUNTER — Ambulatory Visit (INDEPENDENT_AMBULATORY_CARE_PROVIDER_SITE_OTHER): Payer: Medicare Other | Admitting: Gastroenterology

## 2019-12-09 VITALS — BP 134/60 | HR 64 | Temp 98.0°F | Ht 59.5 in | Wt 144.5 lb

## 2019-12-09 DIAGNOSIS — B9681 Helicobacter pylori [H. pylori] as the cause of diseases classified elsewhere: Secondary | ICD-10-CM | POA: Diagnosis not present

## 2019-12-09 DIAGNOSIS — Z7902 Long term (current) use of antithrombotics/antiplatelets: Secondary | ICD-10-CM

## 2019-12-09 DIAGNOSIS — I2510 Atherosclerotic heart disease of native coronary artery without angina pectoris: Secondary | ICD-10-CM

## 2019-12-09 DIAGNOSIS — D500 Iron deficiency anemia secondary to blood loss (chronic): Secondary | ICD-10-CM

## 2019-12-09 DIAGNOSIS — K5521 Angiodysplasia of colon with hemorrhage: Secondary | ICD-10-CM | POA: Diagnosis not present

## 2019-12-09 DIAGNOSIS — K257 Chronic gastric ulcer without hemorrhage or perforation: Secondary | ICD-10-CM

## 2019-12-09 NOTE — Patient Instructions (Signed)
If you are age 84 or older, your body mass index should be between 23-30. Your Body mass index is 28.7 kg/m. If this is out of the aforementioned range listed, please consider follow up with your Primary Care Provider.  If you are age 48 or younger, your body mass index should be between 19-25. Your Body mass index is 28.7 kg/m. If this is out of the aformentioned range listed, please consider follow up with your Primary Care Provider.   It was a pleasure to see you today!  Dr. Loletha Carrow

## 2019-12-09 NOTE — Progress Notes (Addendum)
[facility] Progress Note  Chief Complaint: Anemia of chronic GI blood loss and gastric ulcer  Subjective  History: [name] is seen in follow-up after 2 recent hospitalizations.  I saw Renee during a mid February hospital stay for melena and multifactorial anemia with a previous history of small bowel AVMs.  She was also supratherapeutic on Coumadin (INR 5.4) for Renee mechanical AVR, greater than Renee optimal INR range of 2-2.5 as determined by Renee cardiologist and Coumadin clinic.  Small bowel enteroscopy on February 14th found a nonbleeding jejunal AVM that was ablated with APC, and to nonbleeding small chronic gastric ulcers with biopsy showing H. pylori.  There was great difficulty devising an antibiotic regimen to treat the H. pylori due to Renee multiple allergies and potential drug interactions.  She was then seen in infectious disease office consultation by Dr. Megan Salon on March 2, and antibiotic treatment started with bismuth, doxycycline and Levaquin with PPI.  She was readmitted to the hospital March ninth with melena and acute on chronic anemia.  Renee admission hemoglobin was 5.9, down from 7.3 on February 22, 5 days after the most recent hospital discharge.  She received 2 units PRBCs with an appropriate rise in hemoglobin, hemoglobin remained stable at 8.2 prior to discharge.  Repeat upper endoscopy with Dr. Silverio Decamp March 11 showed superficial clean-based gastric ulcers.  No biopsies taken.  After consultation with cardiology, the decision was made to discontinue aspirin.  I saw [name] along with [name] today.  She has had no further melena since hospital discharge, he is nearly finished taking the antibiotics for H. pylori (which is giving Renee some nausea), and has follow-up with Renee primary care provider scheduled for late next week.  She is taking iron tablets since recent hospital stays. She and [name] feel that Renee most difficult problem at this point is severe right  knee pain and leg edema which was apparently evaluated in the hospital.  [name] had some papers from physical therapy suggesting this was lymphedema.  He was clearly upset at today's visit, feeling that the root of all this trouble can be traced back to Renee right knee pain, because it is giving Renee significant trouble sleeping at night, so then she feels terrible during the day, affects Renee appetite, which he believes is throwing off Renee INR and leading to GI bleeding.  ROS: Cardiovascular:  no chest pain Respiratory: no dyspnea Leg edema and right knee pain as noted above Remainder of systems negative except as above  The patient's Past Medical, Family and Social History were reviewed and are on file in the EMR.  Objective:  Med list reviewed  Current Outpatient Medications:  .  amiodarone (PACERONE) 200 MG tablet, Take 1 tablet (200 mg total) by mouth daily., Disp: 90 tablet, Rfl: 3 .  atorvastatin (LIPITOR) 80 MG tablet, Take 1 tablet by mouth once daily, Disp: 90 tablet, Rfl: 3 .  bevacizumab (AVASTIN) 1.25 mg/0.1 mL SOLN, Place 1.25 mg into the left eye every 8 (eight) weeks., Disp: , Rfl:  .  bismuth subsalicylate (PEPTO BISMOL) 262 MG chewable tablet, Chew 2 tablets (524 mg total) by mouth 3 (three) times daily., Disp: 84 tablet, Rfl: 0 .  doxycycline (VIBRA-TABS) 100 MG tablet, Take 1 tablet (100 mg total) by mouth 2 (two) times daily., Disp: 28 tablet, Rfl: 0 .  ezetimibe (ZETIA) 10 MG tablet, TAKE 1 TABLET BY MOUTH ONCE DAILY PLEASE  MAKE  OVERDUE  APPOINTMENT  WITH  DR  Irish Lack  BEFORE  ANYMORE  REFILLS (Patient taking differently: Take 10 mg by mouth daily. ), Disp: 90 tablet, Rfl: 3 .  FeFum-FePo-FA-B Cmp-C-Zn-Mn-Cu (SE-TAN PLUS) 162-115.2-1 MG CAPS, Take 1 capsule by mouth 2 (two) times daily. , Disp: , Rfl: 3 .  folic acid (FOLVITE) 017 MCG tablet, Take 400 mcg by mouth every evening. , Disp: , Rfl:  .  furosemide (LASIX) 40 MG tablet, Take 40 mg by mouth daily., Disp: ,  Rfl:  .  isosorbide mononitrate (IMDUR) 60 MG 24 hr tablet, Take 1 tablet (60 mg total) by mouth daily. (Patient taking differently: Take 120 mg by mouth daily. ), Disp: 90 tablet, Rfl: 3 .  JANTOVEN 5 MG tablet, TAKE 1/2 TO 1 (ONE-HALF TO ONE) TABLET BY MOUTH ONCE DAILY AS  DIRECTED  BY  COUMADIN  CLINIC, Disp: 90 tablet, Rfl: 0 .  JANUVIA 50 MG tablet, Take 50 mg by mouth daily. , Disp: , Rfl:  .  levofloxacin (LEVAQUIN) 500 MG tablet, Take 1 tablet (500 mg total) by mouth daily., Disp: 14 tablet, Rfl: 0 .  losartan (COZAAR) 50 MG tablet, Take 50 mg by mouth daily., Disp: , Rfl:  .  metoprolol tartrate (LOPRESSOR) 25 MG tablet, Take 25 mg by mouth 2 (two) times daily., Disp: , Rfl:  .  Multiple Vitamin (MULTIVITAMIN WITH MINERALS) TABS, Take 1 tablet by mouth daily., Disp: , Rfl:  .  Multiple Vitamins-Minerals (PRESERVISION AREDS 2 PO), Take 1 tablet by mouth 2 (two) times daily. , Disp: , Rfl:  .  nitroGLYCERIN (NITROSTAT) 0.4 MG SL tablet, Place 1 tablet (0.4 mg total) under the tongue every 5 (five) minutes as needed for chest pain. Please make appt for January. 1st attempt, Disp: 25 tablet, Rfl: 0 .  Omega-3 Fatty Acids (FISH OIL PO), Take 2 capsules by mouth in the morning and at bedtime. , Disp: , Rfl:  .  pantoprazole (PROTONIX) 40 MG tablet, Take 1 tablet (40 mg total) by mouth 2 (two) times daily before a meal., Disp: 90 tablet, Rfl: 1 .  traMADol (ULTRAM) 50 MG tablet, Take 50 mg by mouth every 6 (six) hours as needed for moderate pain., Disp: , Rfl:  .  aspirin EC 81 MG tablet, Take 81 mg by mouth daily., Disp: , Rfl:    Vital signs in last 24 hrs: Vitals:   12/09/19 1030  BP: 134/60  Pulse: 64  Temp: 98 F (36.7 C)    Physical Exam  Somewhat reserved today, [name] does most of the talking.  She cannot get on the exam table because Renee knee is bothering Renee so much.  She needs to use a walker recently.  HEENT: sclera anicteric, oral mucosa moist without lesions  Neck:  supple, no thyromegaly, JVD or lymphadenopathy  Cardiac: RRR with valve click, bilateral leg edema, right greater than left.   Pulm: clear to auscultation bilaterally, normal RR and effort noted  Abdomen: soft, no tenderness, with active bowel sounds.  M did examine a chair  Skin; warm and dry, no jaundice or rash.  Pale  Labs:  CBC Latest Ref Rng & Units 12/03/2019 12/02/2019 12/01/2019  WBC 4.0 - 10.5 K/uL 6.6 7.7 6.4  Hemoglobin 12.0 - 15.0 g/dL 8.2(L) 8.7(L) 8.0(L)  Hematocrit 36.0 - 46.0 % 26.9(L) 28.4(L) 25.6(L)  Platelets 150 - 400 K/uL 167 206 158   INR 2.4 on March 15  folae and B12 high 11/07/19 (but was probably after transfusion)  ___________________________________________ Radiologic studies:   ____________________________________________ Other: Recent EGD report as noted above  _____________________________________________ Assessment & Plan  Assessment: Encounter Diagnoses  Name Primary?  . Iron deficiency anemia due to chronic blood loss Yes  . 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 AVM (arteriovenous malformation) of small bowel, acquired with hemorrhage   . Long term (current) use of antithrombotics/antiplatelets    Macrocytic anemia that is multifactorial of chronic kidney disease and chronic GI blood loss, normal B12 and folate levels (though those were probably checked after the transfusion during the February hospital stay). Recurrent melena with acute on chronic anemia leading to hospitalizations when INR is elevated. Gastric ulcers that are nearly healed on repeat imaging.  Ulcers most likely occurred from both aspirin and H. pylori.  Nearly finished with antibiotic therapy, and will need post treatment testing to confirm eradication.  As near as I can determine, she is off aspirin indefinitely.  She requires long-term Coumadin because of Renee mechanical mitral valve.  I explained to [place] and [name] that I still feel the recurrent  episodes of melena are from small bowel AVMs, most likely multiple in past location I can be reached on endoscopy.  She is known to have these from prior small bowel capsule study years ago under Dr. Nichola Sizer care, and I found one nonbleeding AVM in the jejunum in February.  She post likely has chronic occult blood loss from this, then gets acute on chronic bleeding when Renee Coumadin is elevated.  Whether or not that supratherapeutic INR is entirely related to Renee knee troubles and sleep disturbance as [name] suggests, I agree that the pain must be addressed, and I am glad she has primary care coming up next week.   Plan: Continue once daily pantoprazole at this point.  When H. pylori eradication is confirmed, that can be stopped. I will communicate with Dr. Megan Salon of infectious disease, and asked them to be sure they have plans to do a posttreatment urea breath test 2 to 3 weeks after completing antibiotics, and while off pantoprazole at least 5 days prior.  I told [name], and made a note on Renee medication list, that that medicine should be stopped 5 days prior to get an accurate UBT.  I will also copy this note to the primary care provider, Dr. Gaynelle Arabian at [facility] so he can be up-to-date on Renee GI issues.  She needs chronic iron supplementation and regular checks of CBC and iron levels with primary care.  If oral supplementation does not sufficiently improve Renee hemoglobin (which it may not do given that there is also probably an element of iron malabsorption from CKD) then IV iron with or without hematology consultation should be arranged.  No further plans for repeat endoscopic procedures at present.  No scheduled follow-up with me unless issues arise.  45 minutes were spent on this encounter (including chart review, history/exam, counseling/coordination of care, and documentation)  Nelida Meuse III   Addendum 12/16/2019  Received a call from Dr. Marisue Humble on this patient.  He had  received my office note, repeat hemoglobin today at his office was 10.3, up from 8.3 at the time of recent hospital discharge.  We went over Renee assessment and plan to coordinate all Renee care. He will continue to monitor Renee hemoglobin closely.  Madelon Lips, MD  Addendum 01/06/2020.  Labs from patient's primary care provider, Dr. Gaynelle Arabian dated 01/04/2020:  Hemoglobin A1c 5.4, glucose 155, BUN  20 weight, creatinine 1.4, LFTs normal.  Globin 11.1  H. Loletha Carrow, MD

## 2019-12-11 ENCOUNTER — Other Ambulatory Visit: Payer: Self-pay | Admitting: Internal Medicine

## 2019-12-11 DIAGNOSIS — K253 Acute gastric ulcer without hemorrhage or perforation: Secondary | ICD-10-CM

## 2019-12-11 DIAGNOSIS — B9681 Helicobacter pylori [H. pylori] as the cause of diseases classified elsewhere: Secondary | ICD-10-CM

## 2019-12-12 ENCOUNTER — Ambulatory Visit (INDEPENDENT_AMBULATORY_CARE_PROVIDER_SITE_OTHER): Payer: Medicare Other | Admitting: *Deleted

## 2019-12-12 ENCOUNTER — Other Ambulatory Visit: Payer: Self-pay

## 2019-12-12 DIAGNOSIS — Z5181 Encounter for therapeutic drug level monitoring: Secondary | ICD-10-CM

## 2019-12-12 DIAGNOSIS — I359 Nonrheumatic aortic valve disorder, unspecified: Secondary | ICD-10-CM | POA: Diagnosis not present

## 2019-12-12 LAB — POCT INR: INR: 3.3 — AB (ref 2.0–3.0)

## 2019-12-12 NOTE — Patient Instructions (Signed)
Description   Hold warfarin today, then continue taking 1/2 tablet daily. Continues on Doxycycline and Levaquin 2 week course should complete 12/14/19. Eat an extra serving of greens.  Recheck INR in 1 week. Call our office if you have any problems or concerns [phone].

## 2019-12-16 DIAGNOSIS — K922 Gastrointestinal hemorrhage, unspecified: Secondary | ICD-10-CM | POA: Diagnosis not present

## 2019-12-16 DIAGNOSIS — Q2733 Arteriovenous malformation of digestive system vessel: Secondary | ICD-10-CM | POA: Diagnosis not present

## 2019-12-16 DIAGNOSIS — D649 Anemia, unspecified: Secondary | ICD-10-CM | POA: Diagnosis not present

## 2019-12-16 DIAGNOSIS — M6281 Muscle weakness (generalized): Secondary | ICD-10-CM | POA: Diagnosis not present

## 2019-12-16 DIAGNOSIS — I2510 Atherosclerotic heart disease of native coronary artery without angina pectoris: Secondary | ICD-10-CM | POA: Diagnosis not present

## 2019-12-16 DIAGNOSIS — M79606 Pain in leg, unspecified: Secondary | ICD-10-CM | POA: Diagnosis not present

## 2019-12-19 ENCOUNTER — Other Ambulatory Visit: Payer: Self-pay

## 2019-12-19 ENCOUNTER — Ambulatory Visit (INDEPENDENT_AMBULATORY_CARE_PROVIDER_SITE_OTHER): Payer: Medicare Other | Admitting: Pharmacist

## 2019-12-19 DIAGNOSIS — I359 Nonrheumatic aortic valve disorder, unspecified: Secondary | ICD-10-CM

## 2019-12-19 DIAGNOSIS — Z5181 Encounter for therapeutic drug level monitoring: Secondary | ICD-10-CM | POA: Diagnosis not present

## 2019-12-19 LAB — POCT INR: INR: 1.8 — AB (ref 2.0–3.0)

## 2019-12-19 NOTE — Patient Instructions (Signed)
Description   Continue taking 1/2 tablet daily. Start eating your normal (lower) amount of leafy greens again. Follow up INR in 1 week. Call our office if you have any problems or concerns [phone].

## 2019-12-21 DIAGNOSIS — E1169 Type 2 diabetes mellitus with other specified complication: Secondary | ICD-10-CM | POA: Diagnosis not present

## 2019-12-21 DIAGNOSIS — I509 Heart failure, unspecified: Secondary | ICD-10-CM | POA: Diagnosis not present

## 2019-12-21 DIAGNOSIS — I130 Hypertensive heart and chronic kidney disease with heart failure and stage 1 through stage 4 chronic kidney disease, or unspecified chronic kidney disease: Secondary | ICD-10-CM | POA: Diagnosis not present

## 2019-12-21 DIAGNOSIS — E7800 Pure hypercholesterolemia, unspecified: Secondary | ICD-10-CM | POA: Diagnosis not present

## 2019-12-21 DIAGNOSIS — E113299 Type 2 diabetes mellitus with mild nonproliferative diabetic retinopathy without macular edema, unspecified eye: Secondary | ICD-10-CM | POA: Diagnosis not present

## 2019-12-21 DIAGNOSIS — I2510 Atherosclerotic heart disease of native coronary artery without angina pectoris: Secondary | ICD-10-CM | POA: Diagnosis not present

## 2019-12-21 DIAGNOSIS — E1129 Type 2 diabetes mellitus with other diabetic kidney complication: Secondary | ICD-10-CM | POA: Diagnosis not present

## 2019-12-21 DIAGNOSIS — I663 Occlusion and stenosis of cerebellar arteries: Secondary | ICD-10-CM | POA: Diagnosis not present

## 2019-12-21 DIAGNOSIS — D649 Anemia, unspecified: Secondary | ICD-10-CM | POA: Diagnosis not present

## 2019-12-21 DIAGNOSIS — N183 Chronic kidney disease, stage 3 unspecified: Secondary | ICD-10-CM | POA: Diagnosis not present

## 2019-12-21 DIAGNOSIS — E1139 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: Secondary | ICD-10-CM | POA: Diagnosis not present

## 2019-12-26 ENCOUNTER — Ambulatory Visit (INDEPENDENT_AMBULATORY_CARE_PROVIDER_SITE_OTHER): Payer: Medicare Other | Admitting: *Deleted

## 2019-12-26 ENCOUNTER — Other Ambulatory Visit: Payer: Self-pay

## 2019-12-26 DIAGNOSIS — Z5181 Encounter for therapeutic drug level monitoring: Secondary | ICD-10-CM | POA: Diagnosis not present

## 2019-12-26 DIAGNOSIS — I359 Nonrheumatic aortic valve disorder, unspecified: Secondary | ICD-10-CM

## 2019-12-26 DIAGNOSIS — Z7901 Long term (current) use of anticoagulants: Secondary | ICD-10-CM

## 2019-12-26 LAB — POCT INR: INR: 2.1 (ref 2.0–3.0)

## 2019-12-26 NOTE — Patient Instructions (Signed)
Description   Continue taking 1/2 tablet daily. Recheck INR in 1 week. Call our office if you have any problems or concerns [phone].

## 2019-12-27 DIAGNOSIS — R2681 Unsteadiness on feet: Secondary | ICD-10-CM | POA: Diagnosis not present

## 2019-12-27 DIAGNOSIS — M6281 Muscle weakness (generalized): Secondary | ICD-10-CM | POA: Diagnosis not present

## 2019-12-27 DIAGNOSIS — R262 Difficulty in walking, not elsewhere classified: Secondary | ICD-10-CM | POA: Diagnosis not present

## 2020-01-02 ENCOUNTER — Other Ambulatory Visit: Payer: Self-pay

## 2020-01-02 ENCOUNTER — Ambulatory Visit (INDEPENDENT_AMBULATORY_CARE_PROVIDER_SITE_OTHER): Payer: Medicare Other

## 2020-01-02 DIAGNOSIS — Z5181 Encounter for therapeutic drug level monitoring: Secondary | ICD-10-CM | POA: Diagnosis not present

## 2020-01-02 DIAGNOSIS — Z7901 Long term (current) use of anticoagulants: Secondary | ICD-10-CM

## 2020-01-02 DIAGNOSIS — I359 Nonrheumatic aortic valve disorder, unspecified: Secondary | ICD-10-CM | POA: Diagnosis not present

## 2020-01-02 LAB — POCT INR: INR: 1.7 — AB (ref 2.0–3.0)

## 2020-01-02 NOTE — Patient Instructions (Signed)
Description   Start taking 1/2 tablet daily except 1 tablet on Mondays. Recheck INR in 1 week per request. Call our office if you have any problems or concerns [phone].

## 2020-01-03 DIAGNOSIS — R2681 Unsteadiness on feet: Secondary | ICD-10-CM | POA: Diagnosis not present

## 2020-01-03 DIAGNOSIS — R262 Difficulty in walking, not elsewhere classified: Secondary | ICD-10-CM | POA: Diagnosis not present

## 2020-01-03 DIAGNOSIS — M6281 Muscle weakness (generalized): Secondary | ICD-10-CM | POA: Diagnosis not present

## 2020-01-04 DIAGNOSIS — I480 Paroxysmal atrial fibrillation: Secondary | ICD-10-CM | POA: Diagnosis not present

## 2020-01-04 DIAGNOSIS — I130 Hypertensive heart and chronic kidney disease with heart failure and stage 1 through stage 4 chronic kidney disease, or unspecified chronic kidney disease: Secondary | ICD-10-CM | POA: Diagnosis not present

## 2020-01-04 DIAGNOSIS — E1139 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: Secondary | ICD-10-CM | POA: Diagnosis not present

## 2020-01-04 DIAGNOSIS — I663 Occlusion and stenosis of cerebellar arteries: Secondary | ICD-10-CM | POA: Diagnosis not present

## 2020-01-04 DIAGNOSIS — I509 Heart failure, unspecified: Secondary | ICD-10-CM | POA: Diagnosis not present

## 2020-01-04 DIAGNOSIS — H353221 Exudative age-related macular degeneration, left eye, with active choroidal neovascularization: Secondary | ICD-10-CM | POA: Diagnosis not present

## 2020-01-04 DIAGNOSIS — H353112 Nonexudative age-related macular degeneration, right eye, intermediate dry stage: Secondary | ICD-10-CM | POA: Diagnosis not present

## 2020-01-04 DIAGNOSIS — E113393 Type 2 diabetes mellitus with moderate nonproliferative diabetic retinopathy without macular edema, bilateral: Secondary | ICD-10-CM | POA: Diagnosis not present

## 2020-01-04 DIAGNOSIS — H35371 Puckering of macula, right eye: Secondary | ICD-10-CM | POA: Diagnosis not present

## 2020-01-04 DIAGNOSIS — D649 Anemia, unspecified: Secondary | ICD-10-CM | POA: Diagnosis not present

## 2020-01-04 DIAGNOSIS — I2510 Atherosclerotic heart disease of native coronary artery without angina pectoris: Secondary | ICD-10-CM | POA: Diagnosis not present

## 2020-01-04 DIAGNOSIS — E113293 Type 2 diabetes mellitus with mild nonproliferative diabetic retinopathy without macular edema, bilateral: Secondary | ICD-10-CM | POA: Diagnosis not present

## 2020-01-04 DIAGNOSIS — E1129 Type 2 diabetes mellitus with other diabetic kidney complication: Secondary | ICD-10-CM | POA: Diagnosis not present

## 2020-01-04 DIAGNOSIS — E7800 Pure hypercholesterolemia, unspecified: Secondary | ICD-10-CM | POA: Diagnosis not present

## 2020-01-04 DIAGNOSIS — Z952 Presence of prosthetic heart valve: Secondary | ICD-10-CM | POA: Diagnosis not present

## 2020-01-09 ENCOUNTER — Ambulatory Visit (INDEPENDENT_AMBULATORY_CARE_PROVIDER_SITE_OTHER): Payer: Medicare Other | Admitting: *Deleted

## 2020-01-09 ENCOUNTER — Other Ambulatory Visit: Payer: Self-pay

## 2020-01-09 DIAGNOSIS — I359 Nonrheumatic aortic valve disorder, unspecified: Secondary | ICD-10-CM

## 2020-01-09 DIAGNOSIS — Z5181 Encounter for therapeutic drug level monitoring: Secondary | ICD-10-CM

## 2020-01-09 LAB — POCT INR: INR: 1.5 — AB (ref 2.0–3.0)

## 2020-01-09 NOTE — Patient Instructions (Signed)
Description   Take 1 tablet today and tomorrow, then continue to take 1/2 a tablet daily except for 1 tablets on Mondays.  Recheck Inr in 1 week. Call our office if you have any problems or concerns [phone].

## 2020-01-10 DIAGNOSIS — M6281 Muscle weakness (generalized): Secondary | ICD-10-CM | POA: Diagnosis not present

## 2020-01-10 DIAGNOSIS — R262 Difficulty in walking, not elsewhere classified: Secondary | ICD-10-CM | POA: Diagnosis not present

## 2020-01-10 DIAGNOSIS — R2681 Unsteadiness on feet: Secondary | ICD-10-CM | POA: Diagnosis not present

## 2020-01-17 ENCOUNTER — Encounter: Payer: Self-pay | Admitting: Internal Medicine

## 2020-01-17 ENCOUNTER — Ambulatory Visit (INDEPENDENT_AMBULATORY_CARE_PROVIDER_SITE_OTHER): Payer: Medicare Other | Admitting: *Deleted

## 2020-01-17 ENCOUNTER — Ambulatory Visit (INDEPENDENT_AMBULATORY_CARE_PROVIDER_SITE_OTHER): Payer: Medicare Other | Admitting: Internal Medicine

## 2020-01-17 ENCOUNTER — Other Ambulatory Visit: Payer: Self-pay

## 2020-01-17 DIAGNOSIS — K257 Chronic gastric ulcer without hemorrhage or perforation: Secondary | ICD-10-CM

## 2020-01-17 DIAGNOSIS — I359 Nonrheumatic aortic valve disorder, unspecified: Secondary | ICD-10-CM | POA: Diagnosis not present

## 2020-01-17 DIAGNOSIS — B9681 Helicobacter pylori [H. pylori] as the cause of diseases classified elsewhere: Secondary | ICD-10-CM | POA: Diagnosis not present

## 2020-01-17 DIAGNOSIS — Z5181 Encounter for therapeutic drug level monitoring: Secondary | ICD-10-CM | POA: Diagnosis not present

## 2020-01-17 DIAGNOSIS — R262 Difficulty in walking, not elsewhere classified: Secondary | ICD-10-CM | POA: Diagnosis not present

## 2020-01-17 DIAGNOSIS — I2510 Atherosclerotic heart disease of native coronary artery without angina pectoris: Secondary | ICD-10-CM | POA: Diagnosis not present

## 2020-01-17 DIAGNOSIS — R2681 Unsteadiness on feet: Secondary | ICD-10-CM | POA: Diagnosis not present

## 2020-01-17 DIAGNOSIS — M6281 Muscle weakness (generalized): Secondary | ICD-10-CM | POA: Diagnosis not present

## 2020-01-17 LAB — POCT INR: INR: 1.6 — AB (ref 2.0–3.0)

## 2020-01-17 NOTE — Assessment & Plan Note (Addendum)
She safely completed with her 4 drug regimen for Helicobacter pylori 5 weeks ago.  I will obtain a urea breath test to determine if her infection was cured.  Addendum: Her urea breath test was negative indicating that her Helicobacter pylori infection was cured with her recent 2-week course of 4 drug therapy.

## 2020-01-17 NOTE — Progress Notes (Addendum)
[facility]ious [facility]  Patient Active Problem List   Diagnosis Date Noted  . Gastric ulcer due to Helicobacter pylori 38/75/6433    Priority: High  . Gastrointestinal hemorrhage 11/29/2019  . History of GI bleed 11/04/2019  . Symptomatic anemia 11/03/2019  . Chronic diastolic CHF (congestive heart failure) ([place]) 10/02/2018  . AVM (arteriovenous malformation) of small bowel, acquired   . Acute kidney injury (nontraumatic) ([place])   . HTN (hypertension) 04/15/2015  . Hyperlipidemia 04/15/2015  . Stage 4 chronic kidney disease ([place]) 04/15/2015  . PAF (paroxysmal atrial fibrillation) ([place]) 04/15/2015  . Carotid stenosis 11/28/2013  . Alteration in anticoagulation 11/01/2013  . Normocytic anemia 02/11/2013  . Chronic anticoagulation 02/11/2013  . H/O mechanical aortic valve replacement 02/11/2013  . Coronary Artery Disease 02/11/2013  . Type II diabetes mellitus with renal manifestations ([place]) 02/11/2013    Patient's Medications  New Prescriptions   No medications on file  Previous Medications   AMIODARONE (PACERONE) 200 MG TABLET    Take 1 tablet (200 mg total) by mouth daily.   ASPIRIN EC 81 MG TABLET    Take 81 mg by mouth daily.   ATORVASTATIN (LIPITOR) 80 MG TABLET    Take 1 tablet by mouth once daily   BEVACIZUMAB (AVASTIN) 1.25 MG/0.1 ML SOLN    Place 1.25 mg into the left eye every 8 (eight) weeks.   EZETIMIBE (ZETIA) 10 MG TABLET    TAKE 1 TABLET BY MOUTH ONCE DAILY PLEASE  MAKE  OVERDUE  APPOINTMENT  WITH  DR  Irish Lack  BEFORE  ANYMORE  REFILLS   FEFUM-FEPO-FA-B CMP-C-ZN-MN-CU (SE-TAN PLUS) 162-115.2-1 MG CAPS    Take 1 capsule by mouth 2 (two) times daily.    FOLIC ACID (FOLVITE) 295 MCG TABLET    Take 400 mcg by mouth every evening.    FUROSEMIDE (LASIX) 40 MG TABLET    Take 40 mg by mouth daily.   ISOSORBIDE MONONITRATE (IMDUR) 60 MG 24 HR TABLET    Take 1 tablet (60 mg total) by mouth daily.   JANTOVEN 5 MG TABLET    TAKE 1/2 TO 1 (ONE-HALF  TO ONE) TABLET BY MOUTH ONCE DAILY AS  DIRECTED  BY  COUMADIN  CLINIC   JANUVIA 50 MG TABLET    Take 50 mg by mouth daily.    LOSARTAN (COZAAR) 50 MG TABLET    Take 50 mg by mouth daily.   METOPROLOL TARTRATE (LOPRESSOR) 25 MG TABLET    Take 25 mg by mouth 2 (two) times daily.   MULTIPLE VITAMIN (MULTIVITAMIN WITH MINERALS) TABS    Take 1 tablet by mouth daily.   MULTIPLE VITAMINS-MINERALS (PRESERVISION AREDS 2 PO)    Take 1 tablet by mouth 2 (two) times daily.    NITROGLYCERIN (NITROSTAT) 0.4 MG SL TABLET    Place 1 tablet (0.4 mg total) under the tongue every 5 (five) minutes as needed for chest pain. Please make appt for January. 1st attempt   OMEGA-3 FATTY ACIDS (FISH OIL PO)    Take 2 capsules by mouth in the morning and at bedtime.    TRAMADOL (ULTRAM) 50 MG TABLET    Take 50 mg by mouth every 6 (six) hours as needed for moderate pain.  Modified Medications   No medications on file  Discontinued Medications   BISMUTH SUBSALICYLATE (PEPTO BISMOL) 262 MG CHEWABLE TABLET    Chew 2 tablets (524 mg total) by mouth 3 (three) times daily.  DOXYCYCLINE (VIBRA-TABS) 100 MG TABLET    Take 1 tablet (100 mg total) by mouth 2 (two) times daily.   LEVOFLOXACIN (LEVAQUIN) 500 MG TABLET    Take 1 tablet (500 mg total) by mouth daily.   PANTOPRAZOLE (PROTONIX) 40 MG TABLET    Take 1 tablet (40 mg total) by mouth 2 (two) times daily before a meal.    Subjective: [name] is in with her husband for her routine follow-up visit.  She was recently diagnosed Helicobacter pylori gastritis.  She has had multiple episodes of GI bleeding.  She started Helicobacter therapy with pantoprazole, bismuth, doxycycline and levofloxacin on 11/28/2019.  She was rehospitalized the following day for recurrent bleeding and stayed in the hospital for 4 days.  She completed her 4 drug regimen on 12/12/2019.  She had some nausea and dry heaves while on the medications but this resolved promptly when she stopped.  She does not recall  missing any doses.  She has not had any further bleeding episodes.  Review of Systems: Review of Systems  Constitutional: Negative for fever.  Gastrointestinal: Negative for abdominal pain, blood in stool, heartburn, nausea and vomiting.    Past Medical History:  Diagnosis Date  . Anemia   . Anginal pain ([place])   . Arteriovenous malformation of gastrointestinal tract   . Arthritis    "fingers mostly" (04/12/2015)  . Carotid artery disease ([place])    Carotid US 3/17:  96-78% RICA; 93-81% LICA; Elevated bilateral subclavian artery velocities >>f/u 1 year. // Carotid US 4/18: R 40-59; L 1-39 >> FU 1 year // Carotid US 10/2018: R 40-59; L 1-39, L subclavian stenosis   . CHF (congestive heart failure) ([place])   . Chronic kidney disease (CKD), stage III (moderate)   . Chronic lower GI bleeding    "today; last time was ~ 8 yr ago; used to have them often before that too" (02/11/2013)  . Coronary artery disease   . DJD (degenerative joint disease)   . Dysrhythmia   . Heart murmur   . History of blood transfusion    "a few times over the years; usually related to my Coumadin" (04/12/2015  . History of gout   . Hyperlipemia   . Hypertension   . Macula lutea degeneration   . Old MI (myocardial infarction)    "a coulple /dr in 02/2008; I never even knew I'd had them" (04/12/2015)  . Type II diabetes mellitus (H[facility])     Social History   Tobacco Use  . Smoking status: Never Smoker  . Smokeless tobacco: Never Used  Substance Use Topics  . Alcohol use: No  . Drug use: No    Family History  Problem Relation Age of Onset  . Heart disease Mother   . Heart disease Father     Allergies  Allergen Reactions  . Darvon [Propoxyphene] Nausea And Vomiting  . Lisinopril Cough  . Septra [Sulfamethoxazole-Trimethoprim] Other (See Comments)    Increased INR  . Warfarin And Related Other (See Comments)    ONLY TOLERATES BRAND  . Penicillins Rash    Did it involve swelling of the face/tongue/throat,  SOB, or low BP? No Did it involve sudden or severe rash/hives, skin peeling, or any reaction on the inside of your mouth or nose? No Did you need to seek medical attention at a hospital or doctor's office? No When did it last happen?15 years If all above answers are "NO", may proceed with cephalosporin use.    Objective: Vitals:  01/17/20 1101  BP: (!) 156/58  Pulse: 62  Temp: 97.6 F (36.4 C)  SpO2: (!) 86%  Weight: 139 lb (63 kg)  Height: 5' (1.524 m)   Body mass index is 27.15 kg/m.  Physical Exam Constitutional:      Comments: He is very calm and pleasant.  Abdominal:     Palpations: Abdomen is soft.     Tenderness: There is no abdominal tenderness.  Psychiatric:        Mood and Affect: Mood normal.     Problem List Items Addressed This Visit      High   Gastric ulcer due to Helicobacter pylori    She safely completed with her 4 drug regimen for Helicobacter pylori 5 weeks ago.  I will obtain a urea breath test to determine if her infection was cured.  Addendum: Her urea breath test was negative indicating that her Helicobacter pylori infection was cured with her recent 2-week course of 4 drug therapy.      Relevant Orders   H. pylori breath test (Completed)       Michel Bickers, MD [facility] [phone] pager   [phone] cell 01/18/2020, 4:42 PM

## 2020-01-17 NOTE — Patient Instructions (Signed)
Description   Take 1 tablet today and then start taking 1/2 a tablet daily except for 1 tablet on Mondays and Wednesdays. Recheck INR in 1 week. Call our office if you have any problems or concerns [phone].

## 2020-01-18 ENCOUNTER — Encounter: Payer: Self-pay | Admitting: Gastroenterology

## 2020-01-18 DIAGNOSIS — D649 Anemia, unspecified: Secondary | ICD-10-CM | POA: Diagnosis not present

## 2020-01-18 LAB — H. PYLORI BREATH TEST: H. pylori Breath Test: NOT DETECTED

## 2020-01-24 ENCOUNTER — Other Ambulatory Visit: Payer: Self-pay

## 2020-01-24 ENCOUNTER — Ambulatory Visit (INDEPENDENT_AMBULATORY_CARE_PROVIDER_SITE_OTHER): Payer: Medicare Other | Admitting: *Deleted

## 2020-01-24 DIAGNOSIS — R262 Difficulty in walking, not elsewhere classified: Secondary | ICD-10-CM | POA: Diagnosis not present

## 2020-01-24 DIAGNOSIS — R2681 Unsteadiness on feet: Secondary | ICD-10-CM | POA: Diagnosis not present

## 2020-01-24 DIAGNOSIS — Z5181 Encounter for therapeutic drug level monitoring: Secondary | ICD-10-CM

## 2020-01-24 DIAGNOSIS — M6281 Muscle weakness (generalized): Secondary | ICD-10-CM | POA: Diagnosis not present

## 2020-01-24 DIAGNOSIS — I359 Nonrheumatic aortic valve disorder, unspecified: Secondary | ICD-10-CM

## 2020-01-24 DIAGNOSIS — Z7901 Long term (current) use of anticoagulants: Secondary | ICD-10-CM

## 2020-01-24 LAB — POCT INR: INR: 1.7 — AB (ref 2.0–3.0)

## 2020-01-24 NOTE — Patient Instructions (Signed)
Description   Take 1 tablet today and then start taking 1/2 a tablet daily except for 1 tablet on Mondays,  Wednesdays, and Fridays. Recheck INR in 1 week. Call our office if you have any problems or concerns [phone].

## 2020-01-31 ENCOUNTER — Ambulatory Visit (INDEPENDENT_AMBULATORY_CARE_PROVIDER_SITE_OTHER): Payer: Medicare Other

## 2020-01-31 ENCOUNTER — Other Ambulatory Visit: Payer: Self-pay

## 2020-01-31 DIAGNOSIS — Z5181 Encounter for therapeutic drug level monitoring: Secondary | ICD-10-CM

## 2020-01-31 DIAGNOSIS — I359 Nonrheumatic aortic valve disorder, unspecified: Secondary | ICD-10-CM | POA: Diagnosis not present

## 2020-01-31 DIAGNOSIS — Z7901 Long term (current) use of anticoagulants: Secondary | ICD-10-CM

## 2020-01-31 LAB — POCT INR: INR: 2.3 (ref 2.0–3.0)

## 2020-01-31 NOTE — Patient Instructions (Signed)
Description   Continue on same dosage 1/2 a tablet daily except for 1 tablet on Mondays,  Wednesdays, and Fridays. Recheck INR in 2 weeks. Call our office if you have any problems or concerns [phone].

## 2020-02-06 DIAGNOSIS — D649 Anemia, unspecified: Secondary | ICD-10-CM | POA: Diagnosis not present

## 2020-02-11 ENCOUNTER — Other Ambulatory Visit: Payer: Self-pay | Admitting: Interventional Cardiology

## 2020-02-12 NOTE — Progress Notes (Signed)
Cardiology Office Note   Date:  02/13/2020   ID:  [name], Renee [birth year], MRN [id number]  PCP:  Gaynelle Arabian, MD    No chief complaint on file.  CAD  Wt Readings from Last 3 Encounters:  02/13/20 141 lb (64 kg)  01/17/20 139 lb (63 kg)  12/09/19 144 lb 8 oz (65.5 kg)       History of Present Illness: [name] is a 86 y.o. female   with a hx of CAD and aortic valve disease status post CABG plus mechanical AVR, subsequent PCI to the RCA with BMS, HTN, HL, diabetes, prior GI bleed, CKD, diastolic CHF, PAF.   She was evaluated in 7/16 for anginal symptoms. LHC demonstrated severe native three-vessel disease. LIMA-LAD was patent, SVG-diagonal patent, SVG-OM/PDA occluded. The second half of the OM/PDA graft was open. RCA stents were patent. She had an ostial RCA lesion of 75% which was treated with a Synergy DES. Because she needs Coumadin for her mechanical AVR, she was continued on Coumadin plus Plavix only (no aspirin).  She had some melenaafter the procedure. She has had several episodes of GI bleeding in the past.  Patient had sepsis due to gram-negative bacteremia E. coli related to UTI 11/2018. Echo with normal LV function moderate LVH normally functioning mechanical aortic valve possible mitral valve with small mobile vegetation in the anterior leaflet and small ASD suggested by Doppler.  TEE was negative for vegetations. Patient underwent successful DCCV 08/15/19 for paroxysmal atrial fibrillation.  Shortness of breath improved after this procedure.  She had more GI bleeding in February 2021.  Anticoagulation was held.  She underwent enteroscopy.  She had an argon laser treatment with EGD.  Hemoglobin was 7.3 at the time of discharge.  INR had slightly increased and was therapeutic by November 11, 2019.  It was recommended that aspirin be held for 4 weeks.  Due to bleeding risk, she has not been treated with theinopyridine, more recently.   At her appt  in 2/21, she reported weakness and was found to be in a junctional rhythm.   We stopped metoprolol.  "SHe is on Amio to maintain NSR.  Now with junctional rhythm on ECG. May be related to anemia."  Repeat ECG in 3/21 showed sinus rhythm.  Denies : Chest pain. Dizziness. Leg edema. Nitroglycerin use. Orthopnea. Palpitations. Paroxysmal nocturnal dyspnea. Shortness of breath. Syncope.   She does report some fatigue and feels that it has taken her a while to recover from her last hospitalization.  However, overall, she feels that she is doing quite well.    Past Medical History:  Diagnosis Date  . Anemia   . Anginal pain ([place])   . Arteriovenous malformation of gastrointestinal tract   . Arthritis    "fingers mostly" (04/12/2015)  . Carotid artery disease ([place])    Carotid US 3/17:  63-89% RICA; 37-34% LICA; Elevated bilateral subclavian artery velocities >>f/u 1 year. // Carotid US 4/18: R 40-59; L 1-39 >> FU 1 year // Carotid US 10/2018: R 40-59; L 1-39, L subclavian stenosis   . CHF (congestive heart failure) ([place])   . Chronic kidney disease (CKD), stage III (moderate)   . Chronic lower GI bleeding    "today; last time was ~ 8 yr ago; used to have them often before that too" (02/11/2013)  . Coronary artery disease   . DJD (degenerative joint disease)   . Dysrhythmia   . Heart murmur   . History  of blood transfusion    "a few times over the years; usually related to my Coumadin" (04/12/2015  . History of gout   . Hyperlipemia   . Hypertension   . Macula lutea degeneration   . Old MI (myocardial infarction)    "a coulple /dr in 02/2008; I never even knew I'd had them" (04/12/2015)  . Type II diabetes mellitus ([place])     Past Surgical History:  Procedure Laterality Date  . AORTIC VALVE REPLACEMENT  1993  . APPENDECTOMY  1953  . BIOPSY  11/06/2019   Procedure: BIOPSY;  Surgeon: Doran Stabler, MD;  Location: [place];  Service: Gastroenterology;;  . CARDIAC CATHETERIZATION   [phone]  . CARDIAC CATHETERIZATION  04/12/2015   Procedure: Coronary Stent Intervention;  Surgeon: Jettie Booze, MD;    SYNERGY DES 3.5X16 to the ostial RCA   . CARDIAC CATHETERIZATION  04/12/2015   Procedure: Coronary/Graft Angiography;  Surgeon: Eloy End, MD; LAD & CFX 100%, patent LIMA-LAD, SVG-D1; SVG-OM-PDA first limb 100%, 2nd limb patent; oRCA 75%>0 w/ stent  . [place]   St. Jude/notes 10/29/2003 (02/11/2013)  . CARDIOVERSION N/A 08/15/2019   Procedure: CARDIOVERSION;  Surgeon: Acie Fredrickson Wonda Cheng, MD;  Location: [facility] ENDOSCOPY;  Service: Cardiovascular;  Laterality: N/A;  . COLONOSCOPY N/A 02/13/2013   Procedure: COLONOSCOPY;  Surgeon: Lafayette Dragon, MD;  Location: [facility] ENDOSCOPY;  Service: Endoscopy;  Laterality: N/A;  . CORONARY ANGIOPLASTY WITH STENT PLACEMENT  2009+   "3 at least; put in 1 stent at a time" (02/11/2013)  . CORONARY ARTERY BYPASS GRAFT  1990   LIMA-LAD, SVG-OM-PDA, SVG-D1  . DILATION AND CURETTAGE OF UTERUS  1960's   'after a miscarriage" (02/11/2013)  . ENTEROSCOPY N/A 02/13/2013   Procedure: ENTEROSCOPY;  Surgeon: Lafayette Dragon, MD;  Location: [facility] ENDOSCOPY;  Service: Endoscopy;  Laterality: N/A;  . ENTEROSCOPY N/A 11/06/2019   Procedure: ENTEROSCOPY;  Surgeon: Doran Stabler, MD;  Location: [facility] ENDOSCOPY;  Service: Gastroenterology;  Laterality: N/A;  . ESOPHAGOGASTRODUODENOSCOPY (EGD) WITH PROPOFOL N/A 12/01/2019   Procedure: ESOPHAGOGASTRODUODENOSCOPY (EGD) WITH PROPOFOL;  Surgeon: Mauri Pole, MD;  Location: [place] ENDOSCOPY;  Service: Endoscopy;  Laterality: N/A;  . EXPLORATORY LAPAROTOMY  02/24/2008   which revealed a retroperitoneal hematoma and bleeding from the right external iliac artery/notes 03/02/2008 (02/11/2013)   . FLEXIBLE SIGMOIDOSCOPY N/A 04/21/2015   Procedure: FLEXIBLE SIGMOIDOSCOPY;  Surgeon: Ladene Artist, MD;  Location[facility] END[facility]COPY;  Service: Endoscopy;  Laterality: N/A;  . HOT HEMOSTASIS N/A 11/06/2019    Procedure: HOT HEMOSTASIS (ARGON PLASMA COAGULATION/BICAP);  Surgeon: Doran Stabler, MD;  Location: [place];  Service: Gastroenterology;  Laterality: N/A;  . TEE WITHOUT CARDIOVERSION N/A 12/01/2018   Procedure: TRANSESOPHAGEAL ECHOCARDIOGRAM (TEE);  Surgeon: Jerline Pain, MD;  Location: [facility] ENDOSCOPY;  Service: Cardiovascular;  Laterality: N/A;     Current Outpatient Medications  Medication Sig Dispense Refill  . amiodarone (PACERONE) 200 MG tablet Take 1 tablet (200 mg total) by mouth daily. 90 tablet 3  . aspirin EC 81 MG tablet Take 81 mg by mouth daily.    Marland Kitchen atorvastatin (LIPITOR) 80 MG tablet Take 1 tablet by mouth once daily 90 tablet 3  . bevacizumab (AVASTIN) 1.25 mg/0.1 mL SOLN Place 1.25 mg into the left eye every 8 (eight) weeks.    Marland Kitchen ezetimibe (ZETIA) 10 MG tablet Take 10 mg by mouth daily.    Marland Kitchen FeFum-FePo-FA-B Cmp-C-Zn-Mn-Cu (SE-TAN PLUS) 162-115.2-1 MG CAPS Take  1 capsule by mouth 2 (two) times daily.   3  . folic acid (FOLVITE) 502 MCG tablet Take 400 mcg by mouth every evening.     . furosemide (LASIX) 40 MG tablet Take 40 mg by mouth daily.    . isosorbide mononitrate (IMDUR) 60 MG 24 hr tablet Take 1 tablet (60 mg total) by mouth daily. (Patient taking differently: Take 120 mg by mouth daily. ) 90 tablet 3  . JANTOVEN 5 MG tablet TAKE 1/2 TO 1 (ONE-HALF TO ONE) TABLET BY MOUTH ONCE DAILY AS  DIRECTED  BY  COUMADIN  CLINIC 90 tablet 0  . JANUVIA 50 MG tablet Take 50 mg by mouth daily.     Marland Kitchen losartan (COZAAR) 50 MG tablet Take 50 mg by mouth daily.    . metoprolol tartrate (LOPRESSOR) 25 MG tablet Take 25 mg by mouth 2 (two) times daily.    . Multiple Vitamin (MULTIVITAMIN WITH MINERALS) TABS Take 1 tablet by mouth daily.    . Multiple Vitamins-Minerals (PRESERVISION AREDS 2 PO) Take 1 tablet by mouth 2 (two) times daily.     . nitroGLYCERIN (NITROSTAT) 0.4 MG SL tablet Place 1 tablet (0.4 mg total) under the tongue every 5 (five) minutes as needed for chest pain.  Please make appt for January. 1st attempt 25 tablet 0  . Omega-3 Fatty Acids (FISH OIL PO) Take 2 capsules by mouth in the morning and at bedtime.     . traMADol (ULTRAM) 50 MG tablet Take 50 mg by mouth every 6 (six) hours as needed for moderate pain.     No current facility-administered medications for this visit.    Allergies:   Darvon [propoxyphene], Lisinopril, Septra [sulfamethoxazole-trimethoprim], Warfarin and related, and Penicillins    Social History:  The patient  reports that she has never smoked. She has never used smokeless tobacco. She reports that she does not drink alcohol or use drugs.   Family History:  The patient's family history includes Heart disease in her father and mother.    ROS:  Please see the history of present illness.   Otherwise, review of systems are positive for DOE.   All other systems are reviewed and negative.    PHYSICAL EXAM: VS:  BP 140/62   Pulse 71   Ht 5' (1.524 m)   Wt 141 lb (64 kg)   SpO2 98%   BMI 27.54 kg/m  , BMI Body mass index is 27.54 kg/m. GEN: Well nourished, well developed, in no acute distress  HEENT: normal  Neck: no JVD, carotid bruits, or masses Cardiac: Bradycardic, crisp S2 click, paradoxical S2 split; 1 out of 6 systolic murmurs; no rubs, or gallops,no edema  Respiratory:  clear to auscultation bilaterally, normal work of breathing GI: soft, nontender, nondistended, + BS MS: no deformity or atrophy  Skin: warm and dry, no rash Neuro:  Strength and sensation are intact Psych: euthymic mood, full affect   EKG:   The ekg ordered today demonstrates sinus bradycardia, nonspecific ST changes   Recent Labs: 06/29/2019: TSH 4.260 11/03/2019: ALT 30; Magnesium 2.1 12/03/2019: BUN 22; Creatinine, Ser 1.37; Hemoglobin 8.2; Platelets 167; Potassium 3.6; Sodium 137   Lipid Panel    Component Value Date/Time   CHOL 117 06/29/2019 0953   CHOL 172 05/01/2015 1234   TRIG 153 (H) 06/29/2019 0953   TRIG 248 (H) 05/01/2015  1234   HDL 51 06/29/2019 0953   HDL 46 05/01/2015 1234   CHOLHDL 2.3 06/29/2019 7741  [place] 40 06/29/2019 0953   LDLCALC 76 05/01/2015 1234     Other studies Reviewed: Additional studies/ records that were reviewed today with results demonstrating: prior ECGs reviewed; Hbg 11.6 in 5/21.   ASSESSMENT AND PLAN:  1. CAD:  No angina.  Continue aggressive secondary prevention.   2. AFib: Back in NSR in March 3/21; however, Metoprolol was not stopped. Amio to maintain NSR.  Will stop metoprolol at this point.  We will see if this gives her a little bit more energy.  She is still bradycardic on her ECG.  It is difficult to see P waves.   3. Anticoagulated: Coumadin for stroke prevention. She has been sensitive to small changes in dose in the past.  4. S/p AVR: Crisp S2 click.  SBE prophylaxis.   5. HTN: The current medical regimen is effective;  continue present plan and medications. 6. Chronic diastolic heart failure: Appears euvolemic.    Current medicines are reviewed at length with the patient today.  The patient concerns regarding her medicines were addressed.  The following changes have been made:  No change  Labs/ tests ordered today include:  No orders of the defined types were placed in this encounter.   Recommend 150 minutes/week of aerobic exercise Low fat, low carb, high fiber diet recommended  Disposition:   FU in 6 months   Signed, Larae Grooms, MD  02/13/2020 9:14 AM    [facility] [address], [place], [place]  [address] Phone: [phone]; Fax: [phone]

## 2020-02-13 ENCOUNTER — Ambulatory Visit (INDEPENDENT_AMBULATORY_CARE_PROVIDER_SITE_OTHER): Payer: Medicare Other | Admitting: Interventional Cardiology

## 2020-02-13 ENCOUNTER — Ambulatory Visit (INDEPENDENT_AMBULATORY_CARE_PROVIDER_SITE_OTHER): Payer: Medicare Other | Admitting: *Deleted

## 2020-02-13 ENCOUNTER — Other Ambulatory Visit: Payer: Self-pay

## 2020-02-13 ENCOUNTER — Encounter: Payer: Self-pay | Admitting: Interventional Cardiology

## 2020-02-13 VITALS — BP 140/62 | HR 71 | Ht 60.0 in | Wt 141.0 lb

## 2020-02-13 DIAGNOSIS — I2510 Atherosclerotic heart disease of native coronary artery without angina pectoris: Secondary | ICD-10-CM

## 2020-02-13 DIAGNOSIS — I359 Nonrheumatic aortic valve disorder, unspecified: Secondary | ICD-10-CM | POA: Diagnosis not present

## 2020-02-13 DIAGNOSIS — I5032 Chronic diastolic (congestive) heart failure: Secondary | ICD-10-CM

## 2020-02-13 DIAGNOSIS — E785 Hyperlipidemia, unspecified: Secondary | ICD-10-CM

## 2020-02-13 DIAGNOSIS — Z5181 Encounter for therapeutic drug level monitoring: Secondary | ICD-10-CM

## 2020-02-13 DIAGNOSIS — I10 Essential (primary) hypertension: Secondary | ICD-10-CM | POA: Diagnosis not present

## 2020-02-13 DIAGNOSIS — I480 Paroxysmal atrial fibrillation: Secondary | ICD-10-CM

## 2020-02-13 LAB — POCT INR: INR: 2.4 (ref 2.0–3.0)

## 2020-02-13 NOTE — Patient Instructions (Signed)
Description   Continue on same dosage 1/2 a tablet daily except for 1 tablet on Mondays,  Wednesdays, and Fridays. Recheck INR in 2 weeks. Call our office if you have any problems or concerns [phone].

## 2020-02-13 NOTE — Patient Instructions (Signed)
Medication Instructions:  Your physician has recommended you make the following change in your medication:   STOP: metoprolol  *If you need a refill on your cardiac medications before your next appointment, please call your pharmacy*   Lab Work: None ordered  If you have labs (blood work) drawn today and your tests are completely normal, you will receive your results only by: Marland Kitchen MyChart Message (if you have MyChart) OR . A paper copy in the mail If you have any lab test that is abnormal or we need to change your treatment, we will call you to review the results.   Testing/Procedures: None ordered   Follow-Up: At [facility], you and your health needs are our priority.  As part of our continuing mission to provide you with exceptional heart care, we have created designated Provider Care Teams.  These Care Teams include your primary Cardiologist (physician) and Advanced Practice Providers (APPs -  Physician Assistants and Nurse Practitioners) who all work together to provide you with the care you need, when you need it.  We recommend signing up for the patient portal called "MyChart".  Sign up information is provided on this After Visit Summary.  MyChart is used to connect with patients for Virtual Visits (Telemedicine).  Patients are able to view lab/test results, encounter notes, upcoming appointments, etc.  Non-urgent messages can be sent to your provider as well.   To learn more about what you can do with MyChart, go to [email or web address].    Your next appointment:   6 month(s)  The format for your next appointment:   In Person  Provider:   You may see Larae Grooms, MD or one of the following Advanced Practice Providers on your designated Care Team:    Melina Copa, PA-C  Ermalinda Barrios, PA-C    Other Instructions None

## 2020-02-22 DIAGNOSIS — D649 Anemia, unspecified: Secondary | ICD-10-CM | POA: Diagnosis not present

## 2020-03-02 ENCOUNTER — Other Ambulatory Visit: Payer: Self-pay

## 2020-03-02 ENCOUNTER — Ambulatory Visit (INDEPENDENT_AMBULATORY_CARE_PROVIDER_SITE_OTHER): Payer: Medicare Other | Admitting: *Deleted

## 2020-03-02 DIAGNOSIS — Z5181 Encounter for therapeutic drug level monitoring: Secondary | ICD-10-CM | POA: Diagnosis not present

## 2020-03-02 DIAGNOSIS — I359 Nonrheumatic aortic valve disorder, unspecified: Secondary | ICD-10-CM

## 2020-03-02 LAB — POCT INR: INR: 2 (ref 2.0–3.0)

## 2020-03-02 NOTE — Patient Instructions (Signed)
Description   Continue on same dosage 1/2 a tablet daily except for 1 tablet on Mondays,  Wednesdays, and Fridays. Recheck INR in 3 weeks. Call our office if you have any problems or concerns [phone].

## 2020-03-23 ENCOUNTER — Other Ambulatory Visit: Payer: Self-pay

## 2020-03-23 ENCOUNTER — Ambulatory Visit (INDEPENDENT_AMBULATORY_CARE_PROVIDER_SITE_OTHER): Payer: Medicare Other | Admitting: *Deleted

## 2020-03-23 DIAGNOSIS — Z5181 Encounter for therapeutic drug level monitoring: Secondary | ICD-10-CM

## 2020-03-23 DIAGNOSIS — I359 Nonrheumatic aortic valve disorder, unspecified: Secondary | ICD-10-CM | POA: Diagnosis not present

## 2020-03-23 LAB — POCT INR: INR: 2.4 (ref 2.0–3.0)

## 2020-03-23 NOTE — Patient Instructions (Signed)
Description   Continue on same dosage 1/2 a tablet daily except for 1 tablet on Mondays,  Wednesdays, and Fridays. Recheck INR in 3 weeks. Call our office if you have any problems or concerns [phone].

## 2020-04-13 ENCOUNTER — Other Ambulatory Visit: Payer: Self-pay

## 2020-04-13 ENCOUNTER — Ambulatory Visit (INDEPENDENT_AMBULATORY_CARE_PROVIDER_SITE_OTHER): Payer: Medicare Other | Admitting: *Deleted

## 2020-04-13 DIAGNOSIS — Z5181 Encounter for therapeutic drug level monitoring: Secondary | ICD-10-CM | POA: Diagnosis not present

## 2020-04-13 DIAGNOSIS — I359 Nonrheumatic aortic valve disorder, unspecified: Secondary | ICD-10-CM

## 2020-04-13 LAB — POCT INR: INR: 2.1 (ref 2.0–3.0)

## 2020-04-13 NOTE — Patient Instructions (Signed)
Description   Continue on same dosage 1/2 a tablet daily except for 1 tablet on Mondays,  Wednesdays, and Fridays. Recheck INR in 3 weeks. Call our office if you have any problems or concerns [phone].

## 2020-04-15 ENCOUNTER — Other Ambulatory Visit: Payer: Self-pay | Admitting: Interventional Cardiology

## 2020-05-04 ENCOUNTER — Other Ambulatory Visit: Payer: Self-pay

## 2020-05-04 ENCOUNTER — Encounter: Payer: Self-pay | Admitting: Interventional Cardiology

## 2020-05-04 ENCOUNTER — Ambulatory Visit (INDEPENDENT_AMBULATORY_CARE_PROVIDER_SITE_OTHER): Payer: Medicare Other | Admitting: Pharmacist

## 2020-05-04 ENCOUNTER — Ambulatory Visit (INDEPENDENT_AMBULATORY_CARE_PROVIDER_SITE_OTHER): Payer: Medicare Other | Admitting: Interventional Cardiology

## 2020-05-04 ENCOUNTER — Telehealth: Payer: Self-pay | Admitting: Interventional Cardiology

## 2020-05-04 ENCOUNTER — Ambulatory Visit
Admission: RE | Admit: 2020-05-04 | Discharge: 2020-05-04 | Disposition: A | Discharge status: Home / Self Care | Payer: Medicare Other | Source: Ambulatory Visit | Attending: Interventional Cardiology | Admitting: Interventional Cardiology

## 2020-05-04 VITALS — BP 180/72 | HR 84 | Ht 60.0 in | Wt 141.0 lb

## 2020-05-04 DIAGNOSIS — I359 Nonrheumatic aortic valve disorder, unspecified: Secondary | ICD-10-CM

## 2020-05-04 DIAGNOSIS — S0990XA Unspecified injury of head, initial encounter: Secondary | ICD-10-CM

## 2020-05-04 DIAGNOSIS — I739 Peripheral vascular disease, unspecified: Secondary | ICD-10-CM | POA: Diagnosis not present

## 2020-05-04 DIAGNOSIS — Z7901 Long term (current) use of anticoagulants: Secondary | ICD-10-CM

## 2020-05-04 DIAGNOSIS — S0511XA Contusion of eyeball and orbital tissues, right eye, initial encounter: Secondary | ICD-10-CM | POA: Diagnosis not present

## 2020-05-04 DIAGNOSIS — Z5181 Encounter for therapeutic drug level monitoring: Secondary | ICD-10-CM | POA: Diagnosis not present

## 2020-05-04 DIAGNOSIS — I708 Atherosclerosis of other arteries: Secondary | ICD-10-CM | POA: Diagnosis not present

## 2020-05-04 DIAGNOSIS — I2510 Atherosclerotic heart disease of native coronary artery without angina pectoris: Secondary | ICD-10-CM

## 2020-05-04 DIAGNOSIS — I6389 Other cerebral infarction: Secondary | ICD-10-CM | POA: Diagnosis not present

## 2020-05-04 DIAGNOSIS — I5032 Chronic diastolic (congestive) heart failure: Secondary | ICD-10-CM | POA: Diagnosis not present

## 2020-05-04 LAB — POCT INR: INR: 1.7 — AB (ref 2.0–3.0)

## 2020-05-04 NOTE — Progress Notes (Signed)
Patient had a fall on the way in to clinic. She will have a CT scan of head at [facility] @ 1:00. Will wait for CT results

## 2020-05-04 NOTE — Patient Instructions (Signed)
Hold warfarin for 2 days and then continue on same dosage 1/2 a tablet daily except for 1 tablet on Mondays,  Wednesdays, and Fridays. Recheck INR in 3 weeks. Call our office if you have any problems or concerns [phone].

## 2020-05-04 NOTE — Progress Notes (Signed)
Cardiology Office Note   Date:  05/04/2020   ID:  [name], [name] [birth year], MRN [id number]  PCP:  Gaynelle Arabian, MD    No chief complaint on file.  CAD/AVR/ fall  Wt Readings from Last 3 Encounters:  05/04/20 141 lb (64 kg)  02/13/20 141 lb (64 kg)  01/17/20 139 lb (63 kg)       History of Present Illness: [name] is a 86 y.o. female  with a hx of CAD and aortic valve disease status post CABG plus mechanical AVR, subsequent PCI to the RCA with BMS, HTN, HL, diabetes, prior GI bleed, CKD, diastolic CHF, PAF.   She was evaluated in 7/16 for anginal symptoms. LHC demonstrated severe native three-vessel disease. LIMA-LAD was patent, SVG-diagonal patent, SVG-OM/PDA occluded. The second half of the OM/PDA graft was open. RCA stents were patent. She had an ostial RCA lesion of 75% which was treated with a Synergy DES. Because she needs Coumadin for her mechanical AVR, she was continued on Coumadin plus Plavix only (no aspirin).  She had some melenaafter the procedure.She has had several episodes of GI bleeding in the past.  Patient had sepsis due to gram-negative bacteremia E. coli related to UTI 11/2018. Echo with normal LV function moderate LVH normally functioning mechanical aortic valve possible mitral valve with small mobile vegetation in the anterior leaflet and small ASD suggested by Doppler.TEE was negative for vegetations. Patient underwent successful DCCV 11/23/20for paroxysmal atrial fibrillation.Shortness of breath improved after this procedure.  She had more GI bleeding in February 2021. Anticoagulation was held. She underwent enteroscopy. She had an argon laser treatment with EGD.Hemoglobin was 7.3 at the time of discharge. INR had slightly increased and was therapeutic by November 11, 2019. It was recommended that aspirin be held for 4 weeks.  Due to bleeding risk, she has not been treated with theinopyridine, more recently.   At her  appt in 2/21, she reported weakness and was found to be in a junctional rhythm.  We stopped metoprolol. "SHe is on Amio to maintain NSR. Now with junctional rhythm on ECG. May be related to anemia."  Repeat ECG in 3/21 showed sinus rhythm.  She was on her way to Coumadin clinic this morning.  She was feeling well.  She fell and landed on her face.  She is not having any type of internal headache.  She has pain where the abrasions took place.    Past Medical History:  Diagnosis Date  . Anemia   . Anginal pain ([place])   . Arteriovenous malformation of gastrointestinal tract   . Arthritis    "fingers mostly" (04/12/2015)  . Carotid artery disease ([place])    Carotid US 3/17:  07-62% RICA; 26-33% LICA; Elevated bilateral subclavian artery velocities >>f/u 1 year. // Carotid US 4/18: R 40-59; L 1-39 >> FU 1 year // Carotid US 10/2018: R 40-59; L 1-39, L subclavian stenosis   . CHF (congestive heart failure) ([place])   . Chronic kidney disease (CKD), stage III (moderate)   . Chronic lower GI bleeding    "today; last time was ~ 8 yr ago; used to have them often before that too" (02/11/2013)  . Coronary artery disease   . DJD (degenerative joint disease)   . Dysrhythmia   . Heart murmur   . History of blood transfusion    "a few times over the years; usually related to my Coumadin" (04/12/2015  . History of gout   . Hyperlipemia   .  Hypertension   . Macula lutea degeneration   . Old MI (myocardial infarction)    "a coulple /dr in 02/2008; I never even knew I'd had them" (04/12/2015)  . Type II diabetes mellitus ([place])     Past Surgical History:  Procedure Laterality Date  . AORTIC VALVE REPLACEMENT  1993  . APPENDECTOMY  1953  . BIOPSY  11/06/2019   Procedure: BIOPSY;  Surgeon: Doran Stabler, MD;  Location: [facility];  Service: Gastroenterology;;  . CARDIAC CATHETERIZATION  [phone]  . CARDIAC CATHETERIZATION  04/12/2015   Procedure: Coronary Stent Intervention;  Surgeon: Jettie Booze, MD;    SYNERGY DES 3.5X16 to the ostial RCA   . CARDIAC CATHETERIZATION  04/12/2015   Procedure: Coronary/Graft Angiography;  Surgeon: Eloy End, MD; LAD & CFX 100%, patent LIMA-LAD, SVG-D1; SVG-OM-PDA first limb 100%, 2nd limb patent; oRCA 75%>0 w/ stent  . Dickinson   St. Jude/notes 10/29/2003 (02/11/2013)  . CARDIOVERSION N/A 08/15/2019   Procedure: CARDIOVERSION;  Surgeon: Acie Fredrickson Wonda Cheng, MD;  Location: [facility] ENDOSCOPY;  Service: Cardiovascular;  Laterality: N/A;  . COLONOSCOPY N/A 02/13/2013   Procedure: COLONOSCOPY;  Surgeon: Lafayette Dragon, MD;  Location: [facility] ENDOSCOPY;  Service: Endoscopy;  Laterality: N/A;  . CORONARY ANGIOPLASTY WITH STENT PLACEMENT  2009+   "3 at least; put in 1 stent at a time" (02/11/2013)  . CORONARY ARTERY BYPASS GRAFT  1990   LIMA-LAD, SVG-OM-PDA, SVG-D1  . DILATION AND CURETTAGE OF UTERUS  1960's   'after a miscarriage" (02/11/2013)  . ENTEROSCOPY N/A 02/13/2013   Procedure: ENTEROSCOPY;  Surgeon: Lafayette Dragon, MD;  Location: [facility] ENDOSCOPY;  Service: Endoscopy;  Laterality: N/A;  . ENTEROSCOPY N/A 11/06/2019   Procedure: ENTEROSCOPY;  Surgeon: Doran Stabler, MD;  Location: [facility] ENDOSCOPY;  Service: Gastroenterology;  Laterality: N/A;  . ESOPHAGOGASTRODUODENOSCOPY (EGD) WITH PROPOFOL N/A 12/01/2019   Procedure: ESOPHAGOGASTRODUODENOSCOPY (EGD) WITH PROPOFOL;  Surgeon: Mauri Pole, MD;  Location: [place] ENDOSCOPY;  Service: Endoscopy;  Laterality: N/A;  . EXPLORATORY LAPAROTOMY  02/24/2008   which revealed a retroperitoneal hematoma and bleeding from the right external iliac artery/notes 03/02/2008 (02/11/2013)   . FLEXIBLE SIGMOIDOSCOPY N/A 04/21/2015   Procedure: FLEXIBLE SIGMOIDOSCOPY;  Surgeon: Ladene Artist, MD;  Location: [facility] ENDOSCOPY;  Service: Endoscopy;  Laterality: N/A;  . HOT HEMOSTASIS N/A 11/06/2019   Procedure: HOT HEMOSTASIS (ARGON PLASMA COAGULATION/BICAP);  Surgeon: Doran Stabler, MD;  Location: [place];  Service: Gastroenterology;  Laterality: N/A;  . TEE WITHOUT CARDIOVERSION N/A 12/01/2018   Procedure: TRANSESOPHAGEAL ECHOCARDIOGRAM (TEE);  Surgeon: Jerline Pain, MD;  Location: [facility] ENDOSCOPY;  Service: Cardiovascular;  Laterality: N/A;     Current Outpatient Medications  Medication Sig Dispense Refill  . amiodarone (PACERONE) 200 MG tablet Take 1 tablet (200 mg total) by mouth daily. 90 tablet 3  . aspirin EC 81 MG tablet Take 81 mg by mouth daily.    Marland Kitchen atorvastatin (LIPITOR) 80 MG tablet Take 1 tablet by mouth once daily 90 tablet 3  . bevacizumab (AVASTIN) 1.25 mg/0.1 mL SOLN Place 1.25 mg into the left eye every 8 (eight) weeks.    Marland Kitchen ezetimibe (ZETIA) 10 MG tablet Take 10 mg by mouth daily.    Marland Kitchen FeFum-FePo-FA-B Cmp-C-Zn-Mn-Cu (SE-TAN PLUS) 162-115.2-1 MG CAPS Take 1 capsule by mouth 2 (two) times daily.   3  . folic acid (FOLVITE) 099 MCG tablet Take 400 mcg by mouth every evening.     Marland Kitchen  furosemide (LASIX) 40 MG tablet Take 40 mg by mouth daily.    . isosorbide mononitrate (IMDUR) 60 MG 24 hr tablet Take 1 tablet (60 mg total) by mouth daily. (Patient taking differently: Take 120 mg by mouth daily. ) 90 tablet 3  . JANTOVEN 5 MG tablet TAKE 1/2 TO 1 (ONE-HALF TO ONE) TABLET BY MOUTH ONCE DAILY AS  DIRECTED  BY  COUMADIN  CLINIC 75 tablet 0  . JANUVIA 50 MG tablet Take 50 mg by mouth daily.     Marland Kitchen losartan (COZAAR) 50 MG tablet Take 50 mg by mouth daily.    . Multiple Vitamin (MULTIVITAMIN WITH MINERALS) TABS Take 1 tablet by mouth daily.    . Multiple Vitamins-Minerals (PRESERVISION AREDS 2 PO) Take 1 tablet by mouth 2 (two) times daily.     . nitroGLYCERIN (NITROSTAT) 0.4 MG SL tablet DISSOLVE ONE TABLET UNDER THE TONGUE EVERY 5 MINUTES AS NEEDED FOR CHEST PAIN.  DO NOT EXCEED A TOTAL OF 3 DOSES IN 15 MINUTES 25 tablet 5  . Omega-3 Fatty Acids (FISH OIL PO) Take 2 capsules by mouth in the morning and at bedtime.     . traMADol (ULTRAM) 50 MG tablet Take 50 mg by mouth every 6  (six) hours as needed for moderate pain.     No current facility-administered medications for this visit.    Allergies:   Darvon [propoxyphene], Lisinopril, Septra [sulfamethoxazole-trimethoprim], Warfarin and related, and Penicillins    Social History:  The patient  reports that she has never smoked. She has never used smokeless tobacco. She reports that she does not drink alcohol and does not use drugs.   Family History:  The patient's family history includes Heart disease in her father and mother.    ROS:  Please see the history of present illness.   Otherwise, review of systems are positive for fall today resulting in swelling above right eye.   All other systems are reviewed and negative.    PHYSICAL EXAM: VS:  BP (!) 180/72   Pulse 84   Ht 5' (1.524 m)   Wt 141 lb (64 kg)   BMI 27.54 kg/m  , BMI Body mass index is 27.54 kg/m. GEN: Well nourished, well developed, in no acute distress  HEENT: normal  Neck: no JVD, carotid bruits, or masses Cardiac: RRR; click S2; 2/6 systolic murmurs; no rubs, or gallops,tr LE edema  Respiratory:  clear to auscultation bilaterally, normal work of breathing GI: soft, nontender, nondistended, + BS MS: no deformity or atrophy  Skin: warm and dry, no rash Neuro:  Strength and sensation are intact Psych: euthymic mood, full affect    Recent Labs: 06/29/2019: TSH 4.260 11/03/2019: ALT 30; Magnesium 2.1 12/03/2019: BUN 22; Creatinine, Ser 1.37; Hemoglobin 8.2; Platelets 167; Potassium 3.6; Sodium 137   Lipid Panel    Component Value Date/Time   CHOL 117 06/29/2019 0953   CHOL 172 05/01/2015 1234   TRIG 153 (H) 06/29/2019 0953   TRIG 248 (H) 05/01/2015 1234   HDL 51 06/29/2019 0953   HDL 46 05/01/2015 1234   CHOLHDL 2.3 06/29/2019 0953   LDLCALC 40 06/29/2019 0953   LDLCALC 76 05/01/2015 1234     Other studies Reviewed: Additional studies/ records that were reviewed today with results demonstrating: CT results  reviewed.   ASSESSMENT AND PLAN:  1. CAD/AVR: No angina.  Valve functioning well on exam.  Continue aggressive secondary prevention. 2. AFib: No evidence of AFib on exam today.  Coumadin for  stroke prevention. 3. Head injury/Fall: PLan for CT head and face given her fall on the way to COumadin clinic.  CT results just received showing no intracranial hemorrhage.  There is a facial hematoma and broken nose.  Will refer to ENT. 4. Anticoagulated: Hold Coumadin for 2 days. 5. Blood pressure elevated today after fall.  This is expected. 6. Chronic diastolic heart failure: She appears euvolemic.   Current medicines are reviewed at length with the patient today.  The patient concerns regarding her medicines were addressed.  The following changes have been made: Holding Coumadin for a few days  Labs/ tests ordered today include: CT scan of head and face No orders of the defined types were placed in this encounter.   Recommend 150 minutes/week of aerobic exercise Low fat, low carb, high fiber diet recommended  Disposition:   FU in as scheduled   Signed, Larae Grooms, MD  05/04/2020 2:03 PM    [facility], [place], [place]  [address] Phone: [phone]; Fax: [phone]

## 2020-05-04 NOTE — Telephone Encounter (Signed)
[facility] --call report: CT, face, head: Nasal fractures, anterior right frontal scalp hematoma and pre septal right orbital hematoma. No acute infarct. No hemorrhage in brain.

## 2020-05-04 NOTE — Telephone Encounter (Signed)
Cheryl from [facility] calling with CT results. 

## 2020-05-04 NOTE — Telephone Encounter (Signed)
Per Dr. Irish Lack, patient will need to see ENT today for nasal fractures, hold coumadin for 2 days and then resume. Spoke with Lenna Sciara, PharmD and made aware. Per Lenna Sciara, patient will need a follow-up coumadin appointment in 12-14 days.  Called ENT and they prefer that the patient wait 5-7 days after injury to allow for swelling to decrease. Made Dr. Irish Lack aware.   Patient scheduled on 8/18 at 4:15 PM with Dr. Claiborne Rigg.  Called and spoke to patient's husband (DPR on file). Instructed for patient to hold coumadin tonight and tomorrow night and resume at previous dose on Sunday night. Made him aware of appointments with Dr. Claiborne Rigg on 05/09/20 at 4:15 PM and in the coumdain clinic on 05/16/20 at 12:00 PM. ER precautions reviewed. He verbalized understanding and thanked me for the call.

## 2020-05-04 NOTE — Patient Instructions (Signed)
Medication Instructions:  Your physician recommends that you continue on your current medications as directed. Please refer to the Current Medication list given to you today.  *If you need a refill on your cardiac medications before your next appointment, please call your pharmacy*   Lab Work: None  If you have labs (blood work) drawn today and your tests are completely normal, you will receive your results only by: Marland Kitchen MyChart Message (if you have MyChart) OR . A paper copy in the mail If you have any lab test that is abnormal or we need to change your treatment, we will call you to review the results.   Testing/Procedures: Your physician has requested that you have a head and face CT.     Follow-Up: Based on test results

## 2020-05-04 NOTE — Addendum Note (Signed)
Addended by: Drue Novel I on: 05/04/2020 12:29 PM   Modules accepted: Orders

## 2020-05-14 DIAGNOSIS — S022XXA Fracture of nasal bones, initial encounter for closed fracture: Secondary | ICD-10-CM | POA: Diagnosis not present

## 2020-05-16 ENCOUNTER — Ambulatory Visit (INDEPENDENT_AMBULATORY_CARE_PROVIDER_SITE_OTHER): Payer: Medicare Other | Admitting: *Deleted

## 2020-05-16 ENCOUNTER — Other Ambulatory Visit: Payer: Self-pay

## 2020-05-16 DIAGNOSIS — Z7901 Long term (current) use of anticoagulants: Secondary | ICD-10-CM

## 2020-05-16 DIAGNOSIS — I359 Nonrheumatic aortic valve disorder, unspecified: Secondary | ICD-10-CM | POA: Diagnosis not present

## 2020-05-16 DIAGNOSIS — Z5181 Encounter for therapeutic drug level monitoring: Secondary | ICD-10-CM

## 2020-05-16 LAB — POCT INR: INR: 1.8 — AB (ref 2.0–3.0)

## 2020-05-16 NOTE — Patient Instructions (Signed)
Description   Tomorrow take 1 tablet then continue taking 1/2 tablet daily except 1 tablet on Mondays, Wednesdays, and Fridays. Recheck INR in 1 week. Call our office if you have any problems or concerns [phone].

## 2020-05-22 DIAGNOSIS — H35359 Cystoid macular degeneration, unspecified eye: Secondary | ICD-10-CM | POA: Diagnosis not present

## 2020-05-25 ENCOUNTER — Other Ambulatory Visit: Payer: Self-pay

## 2020-05-25 ENCOUNTER — Ambulatory Visit (INDEPENDENT_AMBULATORY_CARE_PROVIDER_SITE_OTHER): Payer: Medicare Other | Admitting: *Deleted

## 2020-05-25 DIAGNOSIS — Z7901 Long term (current) use of anticoagulants: Secondary | ICD-10-CM | POA: Diagnosis not present

## 2020-05-25 DIAGNOSIS — I359 Nonrheumatic aortic valve disorder, unspecified: Secondary | ICD-10-CM | POA: Diagnosis not present

## 2020-05-25 DIAGNOSIS — Z5181 Encounter for therapeutic drug level monitoring: Secondary | ICD-10-CM

## 2020-05-25 LAB — POCT INR: INR: 2.1 (ref 2.0–3.0)

## 2020-05-25 NOTE — Patient Instructions (Signed)
Description   Continue taking 1/2 tablet daily except 1 tablet on Mondays, Wednesdays, and Fridays. Recheck INR in 2 weeks. Call our office if you have any problems or concerns [phone].

## 2020-05-30 DIAGNOSIS — H35371 Puckering of macula, right eye: Secondary | ICD-10-CM | POA: Diagnosis not present

## 2020-05-30 DIAGNOSIS — H353221 Exudative age-related macular degeneration, left eye, with active choroidal neovascularization: Secondary | ICD-10-CM | POA: Diagnosis not present

## 2020-05-30 DIAGNOSIS — H353112 Nonexudative age-related macular degeneration, right eye, intermediate dry stage: Secondary | ICD-10-CM | POA: Diagnosis not present

## 2020-05-30 DIAGNOSIS — E113293 Type 2 diabetes mellitus with mild nonproliferative diabetic retinopathy without macular edema, bilateral: Secondary | ICD-10-CM | POA: Diagnosis not present

## 2020-06-03 ENCOUNTER — Other Ambulatory Visit: Payer: Self-pay | Admitting: Interventional Cardiology

## 2020-06-07 DIAGNOSIS — Z23 Encounter for immunization: Secondary | ICD-10-CM | POA: Diagnosis not present

## 2020-06-12 ENCOUNTER — Other Ambulatory Visit: Payer: Self-pay

## 2020-06-12 ENCOUNTER — Ambulatory Visit (INDEPENDENT_AMBULATORY_CARE_PROVIDER_SITE_OTHER): Payer: Medicare Other | Admitting: Pharmacist

## 2020-06-12 DIAGNOSIS — I359 Nonrheumatic aortic valve disorder, unspecified: Secondary | ICD-10-CM | POA: Diagnosis not present

## 2020-06-12 DIAGNOSIS — Z7901 Long term (current) use of anticoagulants: Secondary | ICD-10-CM

## 2020-06-12 DIAGNOSIS — Z5181 Encounter for therapeutic drug level monitoring: Secondary | ICD-10-CM | POA: Diagnosis not present

## 2020-06-12 LAB — POCT INR: INR: 2.1 (ref 2.0–3.0)

## 2020-06-12 NOTE — Patient Instructions (Signed)
Description   Continue taking 1/2 tablet daily except 1 tablet on Mondays, Wednesdays, and Fridays. Recheck INR in 3 weeks. Call our office if you have any problems or concerns [phone].

## 2020-07-01 IMAGING — CT CT HEAD W/O CM
3 series · 16 of 47 positions shown, 19 images · non-contrast
Comparison: None

CLINICAL DATA: Head trauma.  Fall going to bathroom.  The

EXAM:
CT HEAD WITHOUT CONTRAST
TECHNIQUE: Contiguous axial images were obtained from the base of the skull
through the vertex without intravenous contrast.

[Series 3: head 5.0 h30s · axial · 0.43mm/px · z∈[-139,-9]mm · 10 of 32 slices shown, 13 images]
[im 3/32  brain]
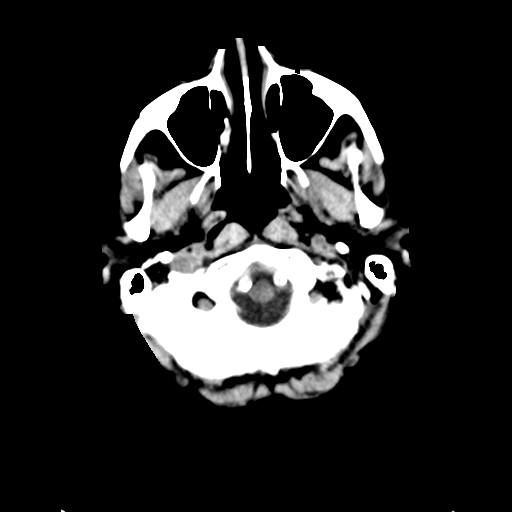
[im 3/32  bone]
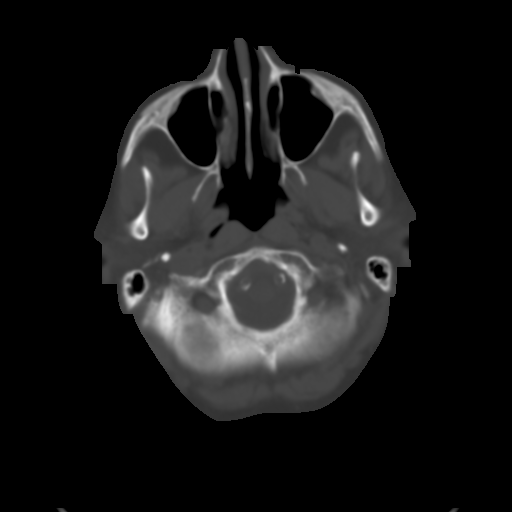
[im 6/32  brain]
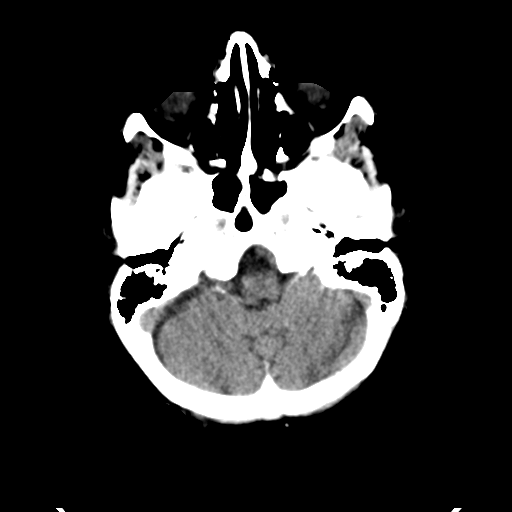
[im 9/32  brain]
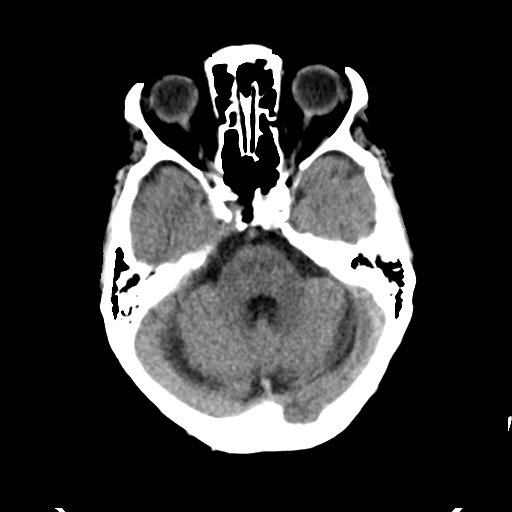
[im 11/32  brain]
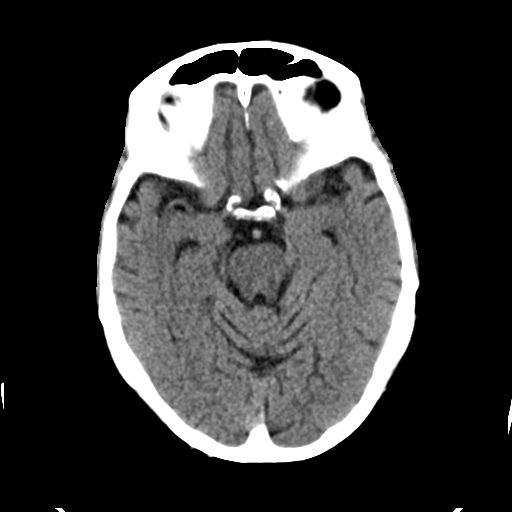
[im 14/32  brain]
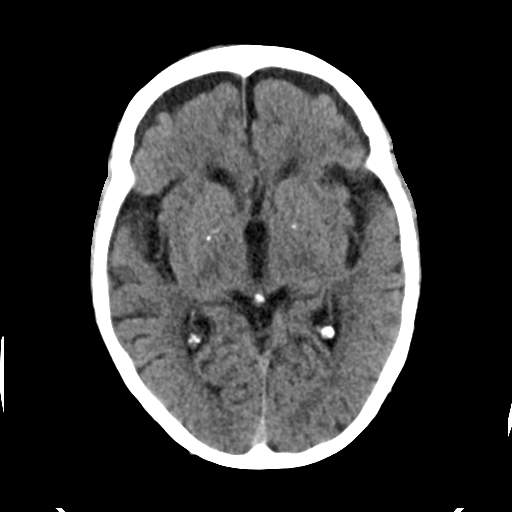
[im 14/32  bone]
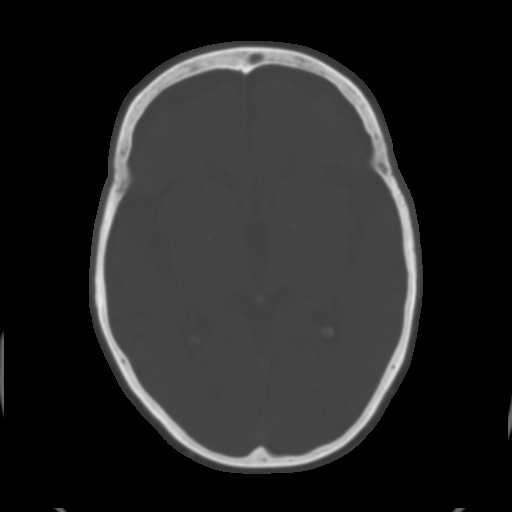
[im 18/32  brain]
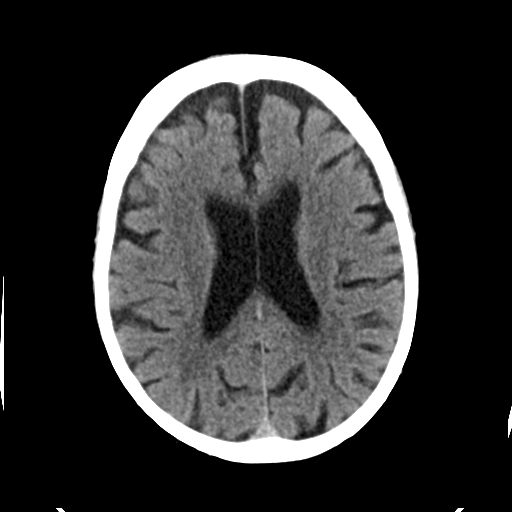
[im 21/32  brain]
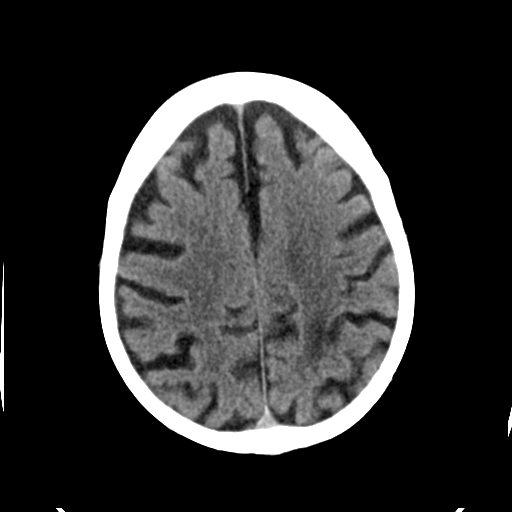
[im 24/32  brain]
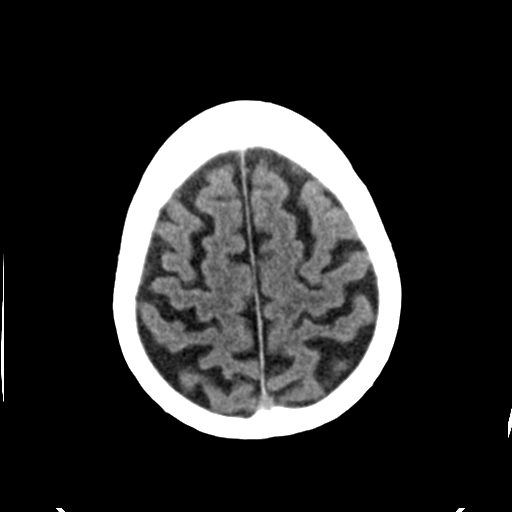
[im 26/32  brain]
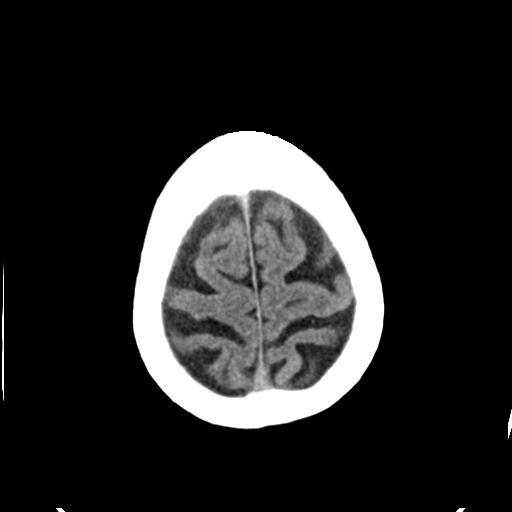
[im 26/32  bone]
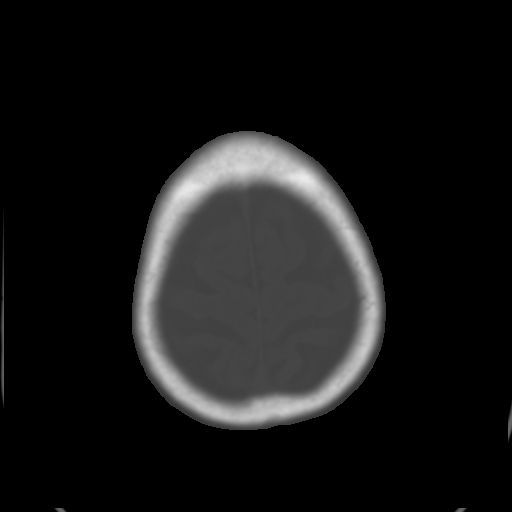
[im 29/32  brain]
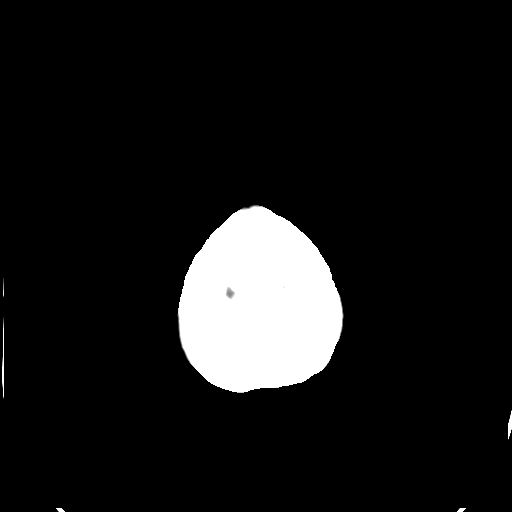

[Series 5: head 3.0 mpr cor · coronal · 0.30mm/px · 3 of 67 slices shown]
[im 23/67  brain]
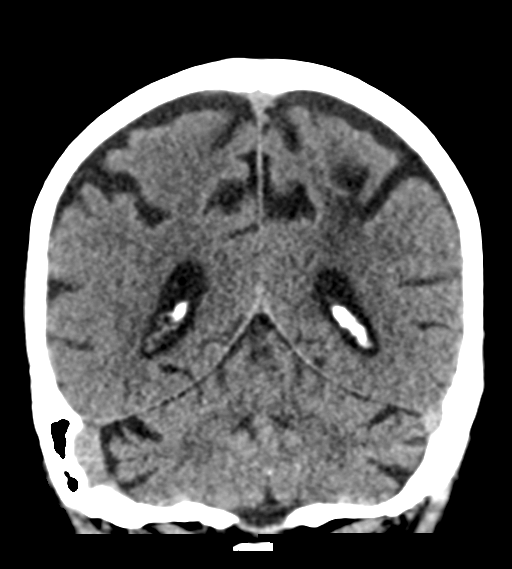
[im 30/67  brain]
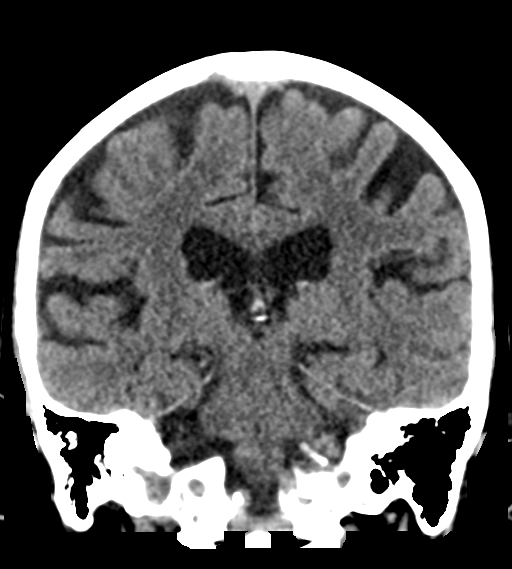
[im 37/67  brain]
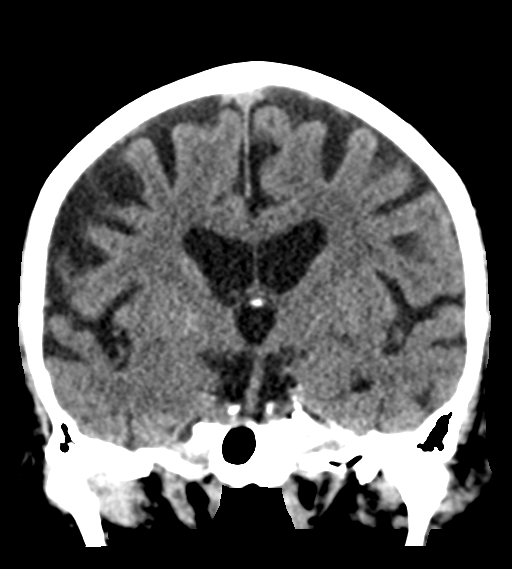

[Series 6: head 3.0 mpr sag · sagittal · 0.35mm/px · 3 of 53 slices shown]
[im 18/53  brain]
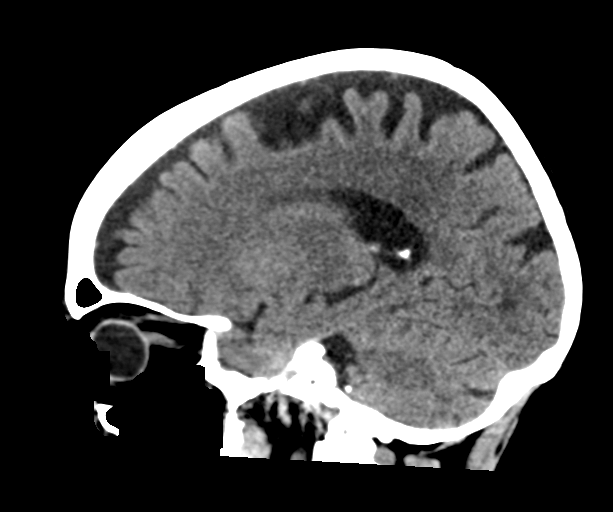
[im 27/53  brain]
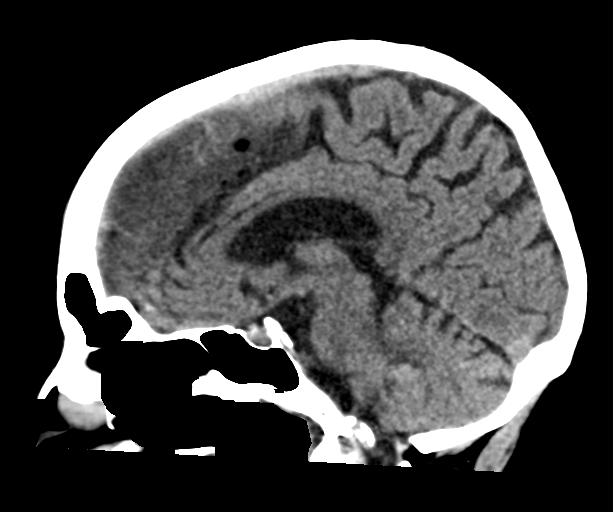
[im 35/53  brain]
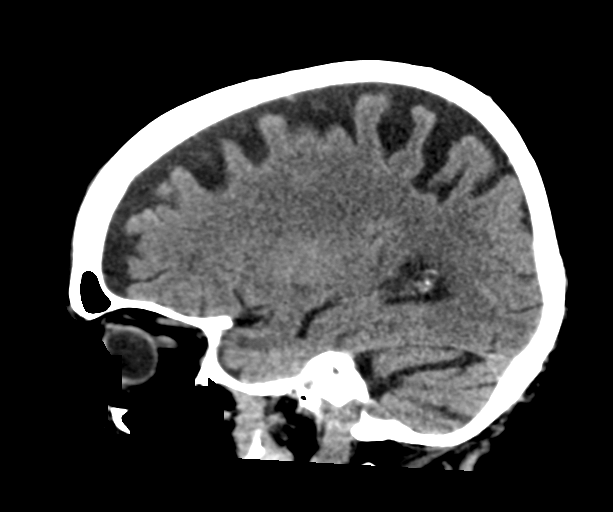

[16 of 47 positions shown; findings below may reference images not displayed]

FINDINGS: Brain: No evidence of acute infarction, hemorrhage, hydrocephalus,
extra-axial collection or mass lesion/mass effect. Chronic infarct
involving the left posterior parietal lobe identified, image [DATE].
There is mild diffuse low-attenuation within the subcortical and
periventricular white matter compatible with chronic microvascular
disease. Prominence of the sulci and ventricles compatible with
brain atrophy.

Vascular: No hyperdense vessel or unexpected calcification.

Skull: Normal. Negative for fracture or focal lesion.

Sinuses/Orbits: No acute finding.

Other: None.
IMPRESSION: 1. No acute intracranial abnormalities.
2. Chronic small vessel ischemic disease and brain atrophy.
3. Chronic left posterior parietal infarct.

## 2020-07-03 ENCOUNTER — Ambulatory Visit (INDEPENDENT_AMBULATORY_CARE_PROVIDER_SITE_OTHER): Payer: Medicare Other

## 2020-07-03 ENCOUNTER — Other Ambulatory Visit: Payer: Self-pay

## 2020-07-03 DIAGNOSIS — Z5181 Encounter for therapeutic drug level monitoring: Secondary | ICD-10-CM

## 2020-07-03 DIAGNOSIS — I359 Nonrheumatic aortic valve disorder, unspecified: Secondary | ICD-10-CM

## 2020-07-03 DIAGNOSIS — Z7901 Long term (current) use of anticoagulants: Secondary | ICD-10-CM | POA: Diagnosis not present

## 2020-07-03 LAB — POCT INR: INR: 1.9 — AB (ref 2.0–3.0)

## 2020-07-03 NOTE — Patient Instructions (Signed)
Description   Take 1 tablet today, then resume same dosage 1/2 tablet daily except 1 tablet on Mondays, Wednesdays, and Fridays. Recheck INR in 3 weeks. Call our office if you have any problems or concerns [phone].

## 2020-07-18 DIAGNOSIS — H353221 Exudative age-related macular degeneration, left eye, with active choroidal neovascularization: Secondary | ICD-10-CM | POA: Diagnosis not present

## 2020-07-18 DIAGNOSIS — Z1389 Encounter for screening for other disorder: Secondary | ICD-10-CM | POA: Diagnosis not present

## 2020-07-18 DIAGNOSIS — I509 Heart failure, unspecified: Secondary | ICD-10-CM | POA: Diagnosis not present

## 2020-07-18 DIAGNOSIS — I130 Hypertensive heart and chronic kidney disease with heart failure and stage 1 through stage 4 chronic kidney disease, or unspecified chronic kidney disease: Secondary | ICD-10-CM | POA: Diagnosis not present

## 2020-07-18 DIAGNOSIS — E1129 Type 2 diabetes mellitus with other diabetic kidney complication: Secondary | ICD-10-CM | POA: Diagnosis not present

## 2020-07-18 DIAGNOSIS — E113291 Type 2 diabetes mellitus with mild nonproliferative diabetic retinopathy without macular edema, right eye: Secondary | ICD-10-CM | POA: Diagnosis not present

## 2020-07-18 DIAGNOSIS — I2510 Atherosclerotic heart disease of native coronary artery without angina pectoris: Secondary | ICD-10-CM | POA: Diagnosis not present

## 2020-07-18 DIAGNOSIS — E7800 Pure hypercholesterolemia, unspecified: Secondary | ICD-10-CM | POA: Diagnosis not present

## 2020-07-18 DIAGNOSIS — E1139 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: Secondary | ICD-10-CM | POA: Diagnosis not present

## 2020-07-18 DIAGNOSIS — Z0000 Encounter for general adult medical examination without abnormal findings: Secondary | ICD-10-CM | POA: Diagnosis not present

## 2020-07-18 DIAGNOSIS — I480 Paroxysmal atrial fibrillation: Secondary | ICD-10-CM | POA: Diagnosis not present

## 2020-07-18 DIAGNOSIS — I663 Occlusion and stenosis of cerebellar arteries: Secondary | ICD-10-CM | POA: Diagnosis not present

## 2020-07-18 DIAGNOSIS — D649 Anemia, unspecified: Secondary | ICD-10-CM | POA: Diagnosis not present

## 2020-07-22 ENCOUNTER — Other Ambulatory Visit: Payer: Self-pay | Admitting: Interventional Cardiology

## 2020-07-24 ENCOUNTER — Ambulatory Visit (INDEPENDENT_AMBULATORY_CARE_PROVIDER_SITE_OTHER): Payer: Medicare Other

## 2020-07-24 ENCOUNTER — Other Ambulatory Visit: Payer: Self-pay

## 2020-07-24 DIAGNOSIS — Z7901 Long term (current) use of anticoagulants: Secondary | ICD-10-CM | POA: Diagnosis not present

## 2020-07-24 DIAGNOSIS — I359 Nonrheumatic aortic valve disorder, unspecified: Secondary | ICD-10-CM

## 2020-07-24 DIAGNOSIS — Z5181 Encounter for therapeutic drug level monitoring: Secondary | ICD-10-CM | POA: Diagnosis not present

## 2020-07-24 LAB — POCT INR: INR: 3.2 — AB (ref 2.0–3.0)

## 2020-07-24 NOTE — Patient Instructions (Signed)
Description   Skip today's dosage of Warfarin, then resume same dosage 1/2 tablet daily except 1 tablet on Mondays, Wednesdays, and Fridays. Eat something green and leafy today, then resume previous vit K intake.  Recheck INR in 2 weeks. Call our office if you have any problems or concerns [phone].

## 2020-07-29 ENCOUNTER — Other Ambulatory Visit: Payer: Self-pay | Admitting: Interventional Cardiology

## 2020-08-07 ENCOUNTER — Other Ambulatory Visit: Payer: Self-pay

## 2020-08-07 ENCOUNTER — Ambulatory Visit (INDEPENDENT_AMBULATORY_CARE_PROVIDER_SITE_OTHER): Payer: Medicare Other | Admitting: *Deleted

## 2020-08-07 DIAGNOSIS — Z5181 Encounter for therapeutic drug level monitoring: Secondary | ICD-10-CM | POA: Diagnosis not present

## 2020-08-07 DIAGNOSIS — I359 Nonrheumatic aortic valve disorder, unspecified: Secondary | ICD-10-CM | POA: Diagnosis not present

## 2020-08-07 LAB — POCT INR: INR: 2.4 (ref 2.0–3.0)

## 2020-08-07 NOTE — Patient Instructions (Signed)
Description    Continue same dosage 1/2 tablet daily except 1 tablet on Mondays, Wednesdays, and Fridays.  Recheck INR in 3 weeks. Call our office if you have any problems or concerns [phone].

## 2020-08-08 DIAGNOSIS — H35371 Puckering of macula, right eye: Secondary | ICD-10-CM | POA: Diagnosis not present

## 2020-08-08 DIAGNOSIS — E113293 Type 2 diabetes mellitus with mild nonproliferative diabetic retinopathy without macular edema, bilateral: Secondary | ICD-10-CM | POA: Diagnosis not present

## 2020-08-08 DIAGNOSIS — H353112 Nonexudative age-related macular degeneration, right eye, intermediate dry stage: Secondary | ICD-10-CM | POA: Diagnosis not present

## 2020-08-08 DIAGNOSIS — H353221 Exudative age-related macular degeneration, left eye, with active choroidal neovascularization: Secondary | ICD-10-CM | POA: Diagnosis not present

## 2020-08-22 DIAGNOSIS — Z23 Encounter for immunization: Secondary | ICD-10-CM | POA: Diagnosis not present

## 2020-08-28 ENCOUNTER — Other Ambulatory Visit: Payer: Self-pay

## 2020-08-28 ENCOUNTER — Ambulatory Visit (INDEPENDENT_AMBULATORY_CARE_PROVIDER_SITE_OTHER): Payer: Medicare Other | Admitting: *Deleted

## 2020-08-28 DIAGNOSIS — I359 Nonrheumatic aortic valve disorder, unspecified: Secondary | ICD-10-CM | POA: Diagnosis not present

## 2020-08-28 DIAGNOSIS — Z7901 Long term (current) use of anticoagulants: Secondary | ICD-10-CM

## 2020-08-28 DIAGNOSIS — Z5181 Encounter for therapeutic drug level monitoring: Secondary | ICD-10-CM

## 2020-08-28 LAB — POCT INR: INR: 3.3 — AB (ref 2.0–3.0)

## 2020-08-28 NOTE — Patient Instructions (Signed)
Description   Do not take any Warfarin today then continue same dosage 1/2 tablet daily except 1 tablet on Mondays, Wednesdays, and Fridays. Recheck INR in 2 weeks. Call our office if you have any problems or concerns [phone].

## 2020-09-08 ENCOUNTER — Other Ambulatory Visit: Payer: Self-pay | Admitting: Physician Assistant

## 2020-09-11 ENCOUNTER — Other Ambulatory Visit: Payer: Self-pay

## 2020-09-11 ENCOUNTER — Ambulatory Visit (INDEPENDENT_AMBULATORY_CARE_PROVIDER_SITE_OTHER): Payer: Medicare Other

## 2020-09-11 DIAGNOSIS — Z5181 Encounter for therapeutic drug level monitoring: Secondary | ICD-10-CM | POA: Diagnosis not present

## 2020-09-11 DIAGNOSIS — Z7901 Long term (current) use of anticoagulants: Secondary | ICD-10-CM

## 2020-09-11 DIAGNOSIS — I359 Nonrheumatic aortic valve disorder, unspecified: Secondary | ICD-10-CM

## 2020-09-11 LAB — POCT INR: INR: 1.9 — AB (ref 2.0–3.0)

## 2020-09-11 NOTE — Patient Instructions (Signed)
Description   Continue same dosage 1/2 tablet daily except 1 tablet on Mondays, Wednesdays, and Fridays. Recheck INR in 2 weeks. Call our office if you have any problems or concerns [phone].

## 2020-09-25 ENCOUNTER — Other Ambulatory Visit: Payer: Self-pay

## 2020-09-25 ENCOUNTER — Ambulatory Visit (INDEPENDENT_AMBULATORY_CARE_PROVIDER_SITE_OTHER): Payer: Medicare Other | Admitting: *Deleted

## 2020-09-25 DIAGNOSIS — Z5181 Encounter for therapeutic drug level monitoring: Secondary | ICD-10-CM | POA: Diagnosis not present

## 2020-09-25 DIAGNOSIS — I359 Nonrheumatic aortic valve disorder, unspecified: Secondary | ICD-10-CM | POA: Diagnosis not present

## 2020-09-25 LAB — POCT INR: INR: 2.4 (ref 2.0–3.0)

## 2020-09-25 NOTE — Patient Instructions (Signed)
Description   Continue same dosage 1/2 tablet daily except 1 tablet on Mondays, Wednesdays, and Fridays. Recheck INR in 2 weeks- per pt request. Call our office if you have any problems or concerns [phone].

## 2020-09-29 ENCOUNTER — Other Ambulatory Visit: Payer: Self-pay | Admitting: Interventional Cardiology

## 2020-10-12 ENCOUNTER — Other Ambulatory Visit: Payer: Self-pay

## 2020-10-12 ENCOUNTER — Ambulatory Visit (INDEPENDENT_AMBULATORY_CARE_PROVIDER_SITE_OTHER): Payer: Medicare Other | Admitting: *Deleted

## 2020-10-12 DIAGNOSIS — Z5181 Encounter for therapeutic drug level monitoring: Secondary | ICD-10-CM

## 2020-10-12 DIAGNOSIS — I359 Nonrheumatic aortic valve disorder, unspecified: Secondary | ICD-10-CM | POA: Diagnosis not present

## 2020-10-12 LAB — POCT INR: INR: 3.4 — AB (ref 2.0–3.0)

## 2020-10-12 NOTE — Patient Instructions (Signed)
Description   Hold today, then continue same dosage 1/2 tablet daily except 1 tablet on Mondays, Wednesdays, and Fridays. Recheck INR in 2 weeks. Call our office if you have any problems or concerns [phone].

## 2020-10-16 DIAGNOSIS — H35371 Puckering of macula, right eye: Secondary | ICD-10-CM | POA: Diagnosis not present

## 2020-10-16 DIAGNOSIS — E113293 Type 2 diabetes mellitus with mild nonproliferative diabetic retinopathy without macular edema, bilateral: Secondary | ICD-10-CM | POA: Diagnosis not present

## 2020-10-16 DIAGNOSIS — H353112 Nonexudative age-related macular degeneration, right eye, intermediate dry stage: Secondary | ICD-10-CM | POA: Diagnosis not present

## 2020-10-16 DIAGNOSIS — H353221 Exudative age-related macular degeneration, left eye, with active choroidal neovascularization: Secondary | ICD-10-CM | POA: Diagnosis not present

## 2020-10-20 ENCOUNTER — Other Ambulatory Visit: Payer: Self-pay | Admitting: Interventional Cardiology

## 2020-10-26 ENCOUNTER — Ambulatory Visit (INDEPENDENT_AMBULATORY_CARE_PROVIDER_SITE_OTHER): Payer: Medicare Other | Admitting: *Deleted

## 2020-10-26 ENCOUNTER — Other Ambulatory Visit: Payer: Self-pay

## 2020-10-26 DIAGNOSIS — Z7901 Long term (current) use of anticoagulants: Secondary | ICD-10-CM

## 2020-10-26 DIAGNOSIS — I359 Nonrheumatic aortic valve disorder, unspecified: Secondary | ICD-10-CM

## 2020-10-26 DIAGNOSIS — Z5181 Encounter for therapeutic drug level monitoring: Secondary | ICD-10-CM

## 2020-10-26 LAB — POCT INR: INR: 3.7 — AB (ref 2.0–3.0)

## 2020-10-26 NOTE — Patient Instructions (Signed)
Description   Hold today, then start taking 1/2 tablet daily except 1 tablet on Mondays and Fridays. Recheck INR in 2 weeks. Call our office if you have any problems or concerns [phone].

## 2020-11-03 ENCOUNTER — Other Ambulatory Visit: Payer: Self-pay | Admitting: Interventional Cardiology

## 2020-11-09 ENCOUNTER — Ambulatory Visit (INDEPENDENT_AMBULATORY_CARE_PROVIDER_SITE_OTHER): Payer: Medicare Other

## 2020-11-09 ENCOUNTER — Other Ambulatory Visit: Payer: Self-pay

## 2020-11-09 DIAGNOSIS — I359 Nonrheumatic aortic valve disorder, unspecified: Secondary | ICD-10-CM

## 2020-11-09 DIAGNOSIS — Z5181 Encounter for therapeutic drug level monitoring: Secondary | ICD-10-CM

## 2020-11-09 DIAGNOSIS — Z7901 Long term (current) use of anticoagulants: Secondary | ICD-10-CM

## 2020-11-09 LAB — POCT INR: INR: 3.9 — AB (ref 2.0–3.0)

## 2020-11-09 NOTE — Patient Instructions (Signed)
Description   Hold today and tomorrow, then start taking 1/2 tablet daily except 1 tablet on Mondays.  Recheck INR in 1 week. Call our office if you have any problems or concerns [phone].

## 2020-11-16 ENCOUNTER — Other Ambulatory Visit: Payer: Self-pay

## 2020-11-16 ENCOUNTER — Ambulatory Visit (INDEPENDENT_AMBULATORY_CARE_PROVIDER_SITE_OTHER): Payer: Medicare Other

## 2020-11-16 DIAGNOSIS — Z5181 Encounter for therapeutic drug level monitoring: Secondary | ICD-10-CM | POA: Diagnosis not present

## 2020-11-16 DIAGNOSIS — I359 Nonrheumatic aortic valve disorder, unspecified: Secondary | ICD-10-CM

## 2020-11-16 DIAGNOSIS — Z7901 Long term (current) use of anticoagulants: Secondary | ICD-10-CM | POA: Diagnosis not present

## 2020-11-16 LAB — POCT INR: INR: 2.1 (ref 2.0–3.0)

## 2020-11-16 NOTE — Patient Instructions (Signed)
-   continue taking 1/2 tablet daily except 1 tablet on Mondays.   - Recheck INR in 2 weeks.  Call our office if you have any problems or concerns [phone].

## 2020-11-27 ENCOUNTER — Ambulatory Visit (INDEPENDENT_AMBULATORY_CARE_PROVIDER_SITE_OTHER): Payer: Medicare Other | Admitting: *Deleted

## 2020-11-27 ENCOUNTER — Other Ambulatory Visit: Payer: Self-pay

## 2020-11-27 DIAGNOSIS — Z5181 Encounter for therapeutic drug level monitoring: Secondary | ICD-10-CM | POA: Diagnosis not present

## 2020-11-27 DIAGNOSIS — I359 Nonrheumatic aortic valve disorder, unspecified: Secondary | ICD-10-CM

## 2020-11-27 DIAGNOSIS — Z7901 Long term (current) use of anticoagulants: Secondary | ICD-10-CM

## 2020-11-27 LAB — POCT INR: INR: 2.1 (ref 2.0–3.0)

## 2020-11-27 NOTE — Patient Instructions (Signed)
Description   Continue taking 1/2 tablet daily except 1 tablet on Mondays. Recheck INR in 3 weeks. Call our office if you have any problems or concerns [phone].        

## 2020-12-18 ENCOUNTER — Ambulatory Visit (INDEPENDENT_AMBULATORY_CARE_PROVIDER_SITE_OTHER): Payer: Medicare Other

## 2020-12-18 ENCOUNTER — Other Ambulatory Visit: Payer: Self-pay

## 2020-12-18 DIAGNOSIS — I359 Nonrheumatic aortic valve disorder, unspecified: Secondary | ICD-10-CM

## 2020-12-18 DIAGNOSIS — Z7901 Long term (current) use of anticoagulants: Secondary | ICD-10-CM | POA: Diagnosis not present

## 2020-12-18 DIAGNOSIS — Z5181 Encounter for therapeutic drug level monitoring: Secondary | ICD-10-CM | POA: Diagnosis not present

## 2020-12-18 LAB — POCT INR: INR: 2.4 (ref 2.0–3.0)

## 2020-12-18 NOTE — Patient Instructions (Signed)
Description   Continue on same dosage 1/2 tablet daily except 1 tablet on Mondays.  Recheck INR in 3 weeks. Call our office if you have any problems or concerns [phone].

## 2021-01-02 DIAGNOSIS — H353112 Nonexudative age-related macular degeneration, right eye, intermediate dry stage: Secondary | ICD-10-CM | POA: Diagnosis not present

## 2021-01-02 DIAGNOSIS — H35371 Puckering of macula, right eye: Secondary | ICD-10-CM | POA: Diagnosis not present

## 2021-01-02 DIAGNOSIS — H353221 Exudative age-related macular degeneration, left eye, with active choroidal neovascularization: Secondary | ICD-10-CM | POA: Diagnosis not present

## 2021-01-02 DIAGNOSIS — H43813 Vitreous degeneration, bilateral: Secondary | ICD-10-CM | POA: Diagnosis not present

## 2021-01-08 ENCOUNTER — Ambulatory Visit (INDEPENDENT_AMBULATORY_CARE_PROVIDER_SITE_OTHER): Payer: Medicare Other | Admitting: *Deleted

## 2021-01-08 ENCOUNTER — Other Ambulatory Visit: Payer: Self-pay

## 2021-01-08 DIAGNOSIS — Z7901 Long term (current) use of anticoagulants: Secondary | ICD-10-CM | POA: Diagnosis not present

## 2021-01-08 DIAGNOSIS — Z5181 Encounter for therapeutic drug level monitoring: Secondary | ICD-10-CM | POA: Diagnosis not present

## 2021-01-08 DIAGNOSIS — I359 Nonrheumatic aortic valve disorder, unspecified: Secondary | ICD-10-CM

## 2021-01-08 LAB — POCT INR: INR: 1.9 — AB (ref 2.0–3.0)

## 2021-01-08 NOTE — Patient Instructions (Signed)
Description   Today take 1 tablet then continue 1/2 tablet daily except 1 tablet on Mondays.  Recheck INR in 3 weeks. Call our office if you have any problems or concerns [phone].

## 2021-01-16 DIAGNOSIS — Z952 Presence of prosthetic heart valve: Secondary | ICD-10-CM | POA: Diagnosis not present

## 2021-01-16 DIAGNOSIS — I509 Heart failure, unspecified: Secondary | ICD-10-CM | POA: Diagnosis not present

## 2021-01-16 DIAGNOSIS — I2510 Atherosclerotic heart disease of native coronary artery without angina pectoris: Secondary | ICD-10-CM | POA: Diagnosis not present

## 2021-01-16 DIAGNOSIS — M109 Gout, unspecified: Secondary | ICD-10-CM | POA: Diagnosis not present

## 2021-01-16 DIAGNOSIS — D649 Anemia, unspecified: Secondary | ICD-10-CM | POA: Diagnosis not present

## 2021-01-16 DIAGNOSIS — E1129 Type 2 diabetes mellitus with other diabetic kidney complication: Secondary | ICD-10-CM | POA: Diagnosis not present

## 2021-01-16 DIAGNOSIS — H353221 Exudative age-related macular degeneration, left eye, with active choroidal neovascularization: Secondary | ICD-10-CM | POA: Diagnosis not present

## 2021-01-16 DIAGNOSIS — E7800 Pure hypercholesterolemia, unspecified: Secondary | ICD-10-CM | POA: Diagnosis not present

## 2021-01-16 DIAGNOSIS — E1139 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: Secondary | ICD-10-CM | POA: Diagnosis not present

## 2021-01-16 DIAGNOSIS — N183 Chronic kidney disease, stage 3 unspecified: Secondary | ICD-10-CM | POA: Diagnosis not present

## 2021-01-16 DIAGNOSIS — I130 Hypertensive heart and chronic kidney disease with heart failure and stage 1 through stage 4 chronic kidney disease, or unspecified chronic kidney disease: Secondary | ICD-10-CM | POA: Diagnosis not present

## 2021-01-16 DIAGNOSIS — I663 Occlusion and stenosis of cerebellar arteries: Secondary | ICD-10-CM | POA: Diagnosis not present

## 2021-01-29 ENCOUNTER — Ambulatory Visit (INDEPENDENT_AMBULATORY_CARE_PROVIDER_SITE_OTHER): Payer: Medicare Other | Admitting: *Deleted

## 2021-01-29 ENCOUNTER — Other Ambulatory Visit: Payer: Self-pay

## 2021-01-29 DIAGNOSIS — I359 Nonrheumatic aortic valve disorder, unspecified: Secondary | ICD-10-CM

## 2021-01-29 DIAGNOSIS — Z5181 Encounter for therapeutic drug level monitoring: Secondary | ICD-10-CM | POA: Diagnosis not present

## 2021-01-29 DIAGNOSIS — Z7901 Long term (current) use of anticoagulants: Secondary | ICD-10-CM | POA: Diagnosis not present

## 2021-01-29 LAB — POCT INR: INR: 1.9 — AB (ref 2.0–3.0)

## 2021-01-29 NOTE — Patient Instructions (Signed)
Description   Today take 1 tablet then continue 1/2 tablet daily except 1 tablet on Mondays.  Recheck INR in 3 weeks. Call our office if you have any problems or concerns [phone].

## 2021-02-20 ENCOUNTER — Ambulatory Visit (INDEPENDENT_AMBULATORY_CARE_PROVIDER_SITE_OTHER): Payer: Medicare Other | Admitting: *Deleted

## 2021-02-20 ENCOUNTER — Other Ambulatory Visit: Payer: Self-pay

## 2021-02-20 DIAGNOSIS — I359 Nonrheumatic aortic valve disorder, unspecified: Secondary | ICD-10-CM

## 2021-02-20 DIAGNOSIS — Z5181 Encounter for therapeutic drug level monitoring: Secondary | ICD-10-CM | POA: Diagnosis not present

## 2021-02-20 LAB — POCT INR: INR: 2.3 (ref 2.0–3.0)

## 2021-02-20 NOTE — Patient Instructions (Signed)
Description   Continue 1/2 tablet daily except 1 tablet on Mondays.  Recheck INR in 3 weeks. Call our office if you have any problems or concerns [phone].

## 2021-03-13 ENCOUNTER — Ambulatory Visit (INDEPENDENT_AMBULATORY_CARE_PROVIDER_SITE_OTHER): Payer: Medicare Other | Admitting: *Deleted

## 2021-03-13 ENCOUNTER — Other Ambulatory Visit: Payer: Self-pay

## 2021-03-13 DIAGNOSIS — Z5181 Encounter for therapeutic drug level monitoring: Secondary | ICD-10-CM

## 2021-03-13 DIAGNOSIS — I359 Nonrheumatic aortic valve disorder, unspecified: Secondary | ICD-10-CM

## 2021-03-13 DIAGNOSIS — Z7901 Long term (current) use of anticoagulants: Secondary | ICD-10-CM

## 2021-03-13 LAB — POCT INR: INR: 2 (ref 2.0–3.0)

## 2021-03-13 NOTE — Patient Instructions (Signed)
Description   Continue 1/2 tablet daily except 1 tablet on Mondays.  Recheck INR in 3 weeks. Call our office if you have any problems or concerns [phone].

## 2021-03-26 DIAGNOSIS — H353112 Nonexudative age-related macular degeneration, right eye, intermediate dry stage: Secondary | ICD-10-CM | POA: Diagnosis not present

## 2021-03-26 DIAGNOSIS — H353221 Exudative age-related macular degeneration, left eye, with active choroidal neovascularization: Secondary | ICD-10-CM | POA: Diagnosis not present

## 2021-03-26 DIAGNOSIS — H43813 Vitreous degeneration, bilateral: Secondary | ICD-10-CM | POA: Diagnosis not present

## 2021-03-26 DIAGNOSIS — E113293 Type 2 diabetes mellitus with mild nonproliferative diabetic retinopathy without macular edema, bilateral: Secondary | ICD-10-CM | POA: Diagnosis not present

## 2021-04-05 ENCOUNTER — Other Ambulatory Visit: Payer: Self-pay

## 2021-04-05 ENCOUNTER — Ambulatory Visit (INDEPENDENT_AMBULATORY_CARE_PROVIDER_SITE_OTHER): Payer: Medicare Other

## 2021-04-05 DIAGNOSIS — I359 Nonrheumatic aortic valve disorder, unspecified: Secondary | ICD-10-CM | POA: Diagnosis not present

## 2021-04-05 DIAGNOSIS — Z7901 Long term (current) use of anticoagulants: Secondary | ICD-10-CM | POA: Diagnosis not present

## 2021-04-05 DIAGNOSIS — Z5181 Encounter for therapeutic drug level monitoring: Secondary | ICD-10-CM

## 2021-04-05 LAB — POCT INR: INR: 2.4 (ref 2.0–3.0)

## 2021-04-05 NOTE — Patient Instructions (Signed)
Continue 1/2 tablet daily except 1 tablet on Mondays.  Recheck INR in 3 weeks. Call our office if you have any problems or concerns [phone].

## 2021-04-06 ENCOUNTER — Other Ambulatory Visit: Payer: Self-pay | Admitting: Interventional Cardiology

## 2021-04-26 ENCOUNTER — Other Ambulatory Visit: Payer: Self-pay

## 2021-04-26 ENCOUNTER — Ambulatory Visit (INDEPENDENT_AMBULATORY_CARE_PROVIDER_SITE_OTHER): Payer: Medicare Other

## 2021-04-26 DIAGNOSIS — Z5181 Encounter for therapeutic drug level monitoring: Secondary | ICD-10-CM | POA: Diagnosis not present

## 2021-04-26 DIAGNOSIS — I359 Nonrheumatic aortic valve disorder, unspecified: Secondary | ICD-10-CM | POA: Diagnosis not present

## 2021-04-26 LAB — POCT INR: INR: 1.9 — AB (ref 2.0–3.0)

## 2021-04-26 NOTE — Patient Instructions (Signed)
Description   Take 1 tablet tonight and then continue 1/2 tablet daily except 1 tablet on Mondays.  Recheck INR in 3 weeks. Call our office if you have any problems or concerns [phone].

## 2021-05-13 ENCOUNTER — Other Ambulatory Visit: Payer: Self-pay | Admitting: Family Medicine

## 2021-05-13 ENCOUNTER — Ambulatory Visit
Admission: RE | Admit: 2021-05-13 | Discharge: 2021-05-13 | Disposition: A | Discharge status: Home / Self Care | Payer: Medicare Other | Source: Ambulatory Visit | Attending: Family Medicine | Admitting: Family Medicine

## 2021-05-13 DIAGNOSIS — M79604 Pain in right leg: Secondary | ICD-10-CM

## 2021-05-13 DIAGNOSIS — M7989 Other specified soft tissue disorders: Secondary | ICD-10-CM

## 2021-05-13 DIAGNOSIS — K921 Melena: Secondary | ICD-10-CM | POA: Diagnosis not present

## 2021-05-17 ENCOUNTER — Other Ambulatory Visit: Payer: Self-pay

## 2021-05-17 ENCOUNTER — Ambulatory Visit (INDEPENDENT_AMBULATORY_CARE_PROVIDER_SITE_OTHER): Payer: Medicare Other

## 2021-05-17 DIAGNOSIS — Z7901 Long term (current) use of anticoagulants: Secondary | ICD-10-CM | POA: Diagnosis not present

## 2021-05-17 DIAGNOSIS — I359 Nonrheumatic aortic valve disorder, unspecified: Secondary | ICD-10-CM

## 2021-05-17 DIAGNOSIS — Z5181 Encounter for therapeutic drug level monitoring: Secondary | ICD-10-CM

## 2021-05-17 LAB — POCT INR: INR: 1.8 — AB (ref 2.0–3.0)

## 2021-05-17 NOTE — Patient Instructions (Signed)
Description   Take 1 tablet tonight and then resume same dosage 1/2 tablet daily except 1 tablet on Mondays. Recheck INR in 2 weeks. Call our office if you have any problems or concerns [phone].

## 2021-05-31 ENCOUNTER — Other Ambulatory Visit: Payer: Self-pay

## 2021-05-31 ENCOUNTER — Ambulatory Visit (INDEPENDENT_AMBULATORY_CARE_PROVIDER_SITE_OTHER): Payer: Medicare Other

## 2021-05-31 DIAGNOSIS — Z5181 Encounter for therapeutic drug level monitoring: Secondary | ICD-10-CM | POA: Diagnosis not present

## 2021-05-31 DIAGNOSIS — I359 Nonrheumatic aortic valve disorder, unspecified: Secondary | ICD-10-CM

## 2021-05-31 LAB — POCT INR: INR: 2.8 (ref 2.0–3.0)

## 2021-05-31 NOTE — Patient Instructions (Signed)
Description   Eat greens today and continue 1/2 tablet daily except 1 tablet on Mondays. Recheck INR in 2 weeks. Call our office if you have any problems or concerns [phone].

## 2021-06-09 ENCOUNTER — Other Ambulatory Visit: Payer: Self-pay | Admitting: Interventional Cardiology

## 2021-06-14 ENCOUNTER — Ambulatory Visit (INDEPENDENT_AMBULATORY_CARE_PROVIDER_SITE_OTHER): Payer: Medicare Other

## 2021-06-14 ENCOUNTER — Other Ambulatory Visit: Payer: Self-pay

## 2021-06-14 DIAGNOSIS — Z7901 Long term (current) use of anticoagulants: Secondary | ICD-10-CM

## 2021-06-14 DIAGNOSIS — I359 Nonrheumatic aortic valve disorder, unspecified: Secondary | ICD-10-CM

## 2021-06-14 DIAGNOSIS — Z5181 Encounter for therapeutic drug level monitoring: Secondary | ICD-10-CM | POA: Diagnosis not present

## 2021-06-14 DIAGNOSIS — Z23 Encounter for immunization: Secondary | ICD-10-CM | POA: Diagnosis not present

## 2021-06-14 LAB — POCT INR: INR: 2.6 (ref 2.0–3.0)

## 2021-06-14 NOTE — Patient Instructions (Signed)
Eat greens today and continue 1/2 tablet daily except 1 tablet on Mondays. Recheck INR in 2 weeks. Call our office if you have any problems or concerns [phone].

## 2021-06-22 ENCOUNTER — Other Ambulatory Visit: Payer: Self-pay | Admitting: Interventional Cardiology

## 2021-06-25 DIAGNOSIS — E113293 Type 2 diabetes mellitus with mild nonproliferative diabetic retinopathy without macular edema, bilateral: Secondary | ICD-10-CM | POA: Diagnosis not present

## 2021-06-25 DIAGNOSIS — H353221 Exudative age-related macular degeneration, left eye, with active choroidal neovascularization: Secondary | ICD-10-CM | POA: Diagnosis not present

## 2021-06-25 DIAGNOSIS — H353112 Nonexudative age-related macular degeneration, right eye, intermediate dry stage: Secondary | ICD-10-CM | POA: Diagnosis not present

## 2021-06-25 DIAGNOSIS — H43813 Vitreous degeneration, bilateral: Secondary | ICD-10-CM | POA: Diagnosis not present

## 2021-06-28 ENCOUNTER — Ambulatory Visit (INDEPENDENT_AMBULATORY_CARE_PROVIDER_SITE_OTHER): Payer: Medicare Other

## 2021-06-28 ENCOUNTER — Other Ambulatory Visit: Payer: Self-pay

## 2021-06-28 DIAGNOSIS — Z7901 Long term (current) use of anticoagulants: Secondary | ICD-10-CM | POA: Diagnosis not present

## 2021-06-28 DIAGNOSIS — Z5181 Encounter for therapeutic drug level monitoring: Secondary | ICD-10-CM

## 2021-06-28 DIAGNOSIS — I359 Nonrheumatic aortic valve disorder, unspecified: Secondary | ICD-10-CM

## 2021-06-28 LAB — POCT INR: INR: 2.2 (ref 2.0–3.0)

## 2021-06-28 NOTE — Patient Instructions (Signed)
-   continue 1/2 tablet daily except 1 tablet on Mondays.  - Recheck INR in 2.5 weeks.  Call our office if you have any problems or concerns [phone].

## 2021-06-29 ENCOUNTER — Other Ambulatory Visit: Payer: Self-pay | Admitting: Interventional Cardiology

## 2021-07-16 ENCOUNTER — Ambulatory Visit (INDEPENDENT_AMBULATORY_CARE_PROVIDER_SITE_OTHER): Payer: Medicare Other | Admitting: Pharmacist

## 2021-07-16 ENCOUNTER — Other Ambulatory Visit: Payer: Self-pay

## 2021-07-16 DIAGNOSIS — Z7901 Long term (current) use of anticoagulants: Secondary | ICD-10-CM

## 2021-07-16 DIAGNOSIS — Z5181 Encounter for therapeutic drug level monitoring: Secondary | ICD-10-CM | POA: Diagnosis not present

## 2021-07-16 DIAGNOSIS — I359 Nonrheumatic aortic valve disorder, unspecified: Secondary | ICD-10-CM

## 2021-07-16 LAB — POCT INR: INR: 1.8 — AB (ref 2.0–3.0)

## 2021-07-16 NOTE — Patient Instructions (Signed)
-  Take 1 tablet today then continue 1/2 tablet daily except 1 tablet on Mondays.  - Recheck INR in 2 weeks.  Call our office if you have any problems or concerns [phone].

## 2021-07-23 DIAGNOSIS — I2510 Atherosclerotic heart disease of native coronary artery without angina pectoris: Secondary | ICD-10-CM | POA: Diagnosis not present

## 2021-07-23 DIAGNOSIS — I480 Paroxysmal atrial fibrillation: Secondary | ICD-10-CM | POA: Diagnosis not present

## 2021-07-23 DIAGNOSIS — I663 Occlusion and stenosis of cerebellar arteries: Secondary | ICD-10-CM | POA: Diagnosis not present

## 2021-07-23 DIAGNOSIS — E1139 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: Secondary | ICD-10-CM | POA: Diagnosis not present

## 2021-07-23 DIAGNOSIS — E1129 Type 2 diabetes mellitus with other diabetic kidney complication: Secondary | ICD-10-CM | POA: Diagnosis not present

## 2021-07-23 DIAGNOSIS — Z952 Presence of prosthetic heart valve: Secondary | ICD-10-CM | POA: Diagnosis not present

## 2021-07-23 DIAGNOSIS — I509 Heart failure, unspecified: Secondary | ICD-10-CM | POA: Diagnosis not present

## 2021-07-23 DIAGNOSIS — Z1389 Encounter for screening for other disorder: Secondary | ICD-10-CM | POA: Diagnosis not present

## 2021-07-23 DIAGNOSIS — N183 Chronic kidney disease, stage 3 unspecified: Secondary | ICD-10-CM | POA: Diagnosis not present

## 2021-07-23 DIAGNOSIS — I130 Hypertensive heart and chronic kidney disease with heart failure and stage 1 through stage 4 chronic kidney disease, or unspecified chronic kidney disease: Secondary | ICD-10-CM | POA: Diagnosis not present

## 2021-07-23 DIAGNOSIS — Z0000 Encounter for general adult medical examination without abnormal findings: Secondary | ICD-10-CM | POA: Diagnosis not present

## 2021-07-23 DIAGNOSIS — E7800 Pure hypercholesterolemia, unspecified: Secondary | ICD-10-CM | POA: Diagnosis not present

## 2021-07-23 DIAGNOSIS — D649 Anemia, unspecified: Secondary | ICD-10-CM | POA: Diagnosis not present

## 2021-07-27 ENCOUNTER — Other Ambulatory Visit: Payer: Self-pay | Admitting: Interventional Cardiology

## 2021-07-29 ENCOUNTER — Other Ambulatory Visit: Payer: Self-pay

## 2021-07-29 MED ORDER — ATORVASTATIN CALCIUM 80 MG PO TABS: 80.0000 mg | ORAL_TABLET | Freq: Every day | ORAL | 0 refills | Status: DC

## 2021-07-30 ENCOUNTER — Other Ambulatory Visit: Payer: Self-pay

## 2021-07-30 ENCOUNTER — Ambulatory Visit (INDEPENDENT_AMBULATORY_CARE_PROVIDER_SITE_OTHER): Payer: Medicare Other

## 2021-07-30 DIAGNOSIS — Z5181 Encounter for therapeutic drug level monitoring: Secondary | ICD-10-CM

## 2021-07-30 DIAGNOSIS — I359 Nonrheumatic aortic valve disorder, unspecified: Secondary | ICD-10-CM

## 2021-07-30 DIAGNOSIS — Z7901 Long term (current) use of anticoagulants: Secondary | ICD-10-CM | POA: Diagnosis not present

## 2021-07-30 LAB — POCT INR: INR: 1.9 — AB (ref 2.0–3.0)

## 2021-07-30 NOTE — Patient Instructions (Signed)
Description   Take 1 tablet today then continue on same dosage of Warfarin 1/2 tablet daily except 1 tablet on Mondays. Recheck INR in 2 weeks.  Call our office if you have any problems or concerns [phone].

## 2021-08-02 MED ORDER — ISOSORBIDE MONONITRATE ER 60 MG PO TB24: ORAL_TABLET | ORAL | 1 refills | Status: DC

## 2021-08-02 MED ORDER — ATORVASTATIN CALCIUM 80 MG PO TABS: 80.0000 mg | ORAL_TABLET | Freq: Every day | ORAL | 1 refills | Status: DC

## 2021-08-02 MED ORDER — AMIODARONE HCL 200 MG PO TABS: 200.0000 mg | ORAL_TABLET | Freq: Every day | ORAL | 1 refills | Status: DC

## 2021-08-02 MED ORDER — EZETIMIBE 10 MG PO TABS: ORAL_TABLET | ORAL | 1 refills | Status: DC

## 2021-08-03 IMAGING — DX DG CHEST 1V PORT
1 series · 1 of 1 positions shown · non-contrast
Comparison: Radiograph 11/27/2018

CLINICAL DATA: Weakness and shortness of breath.

EXAM:
PORTABLE CHEST 1 VIEW

[chest]
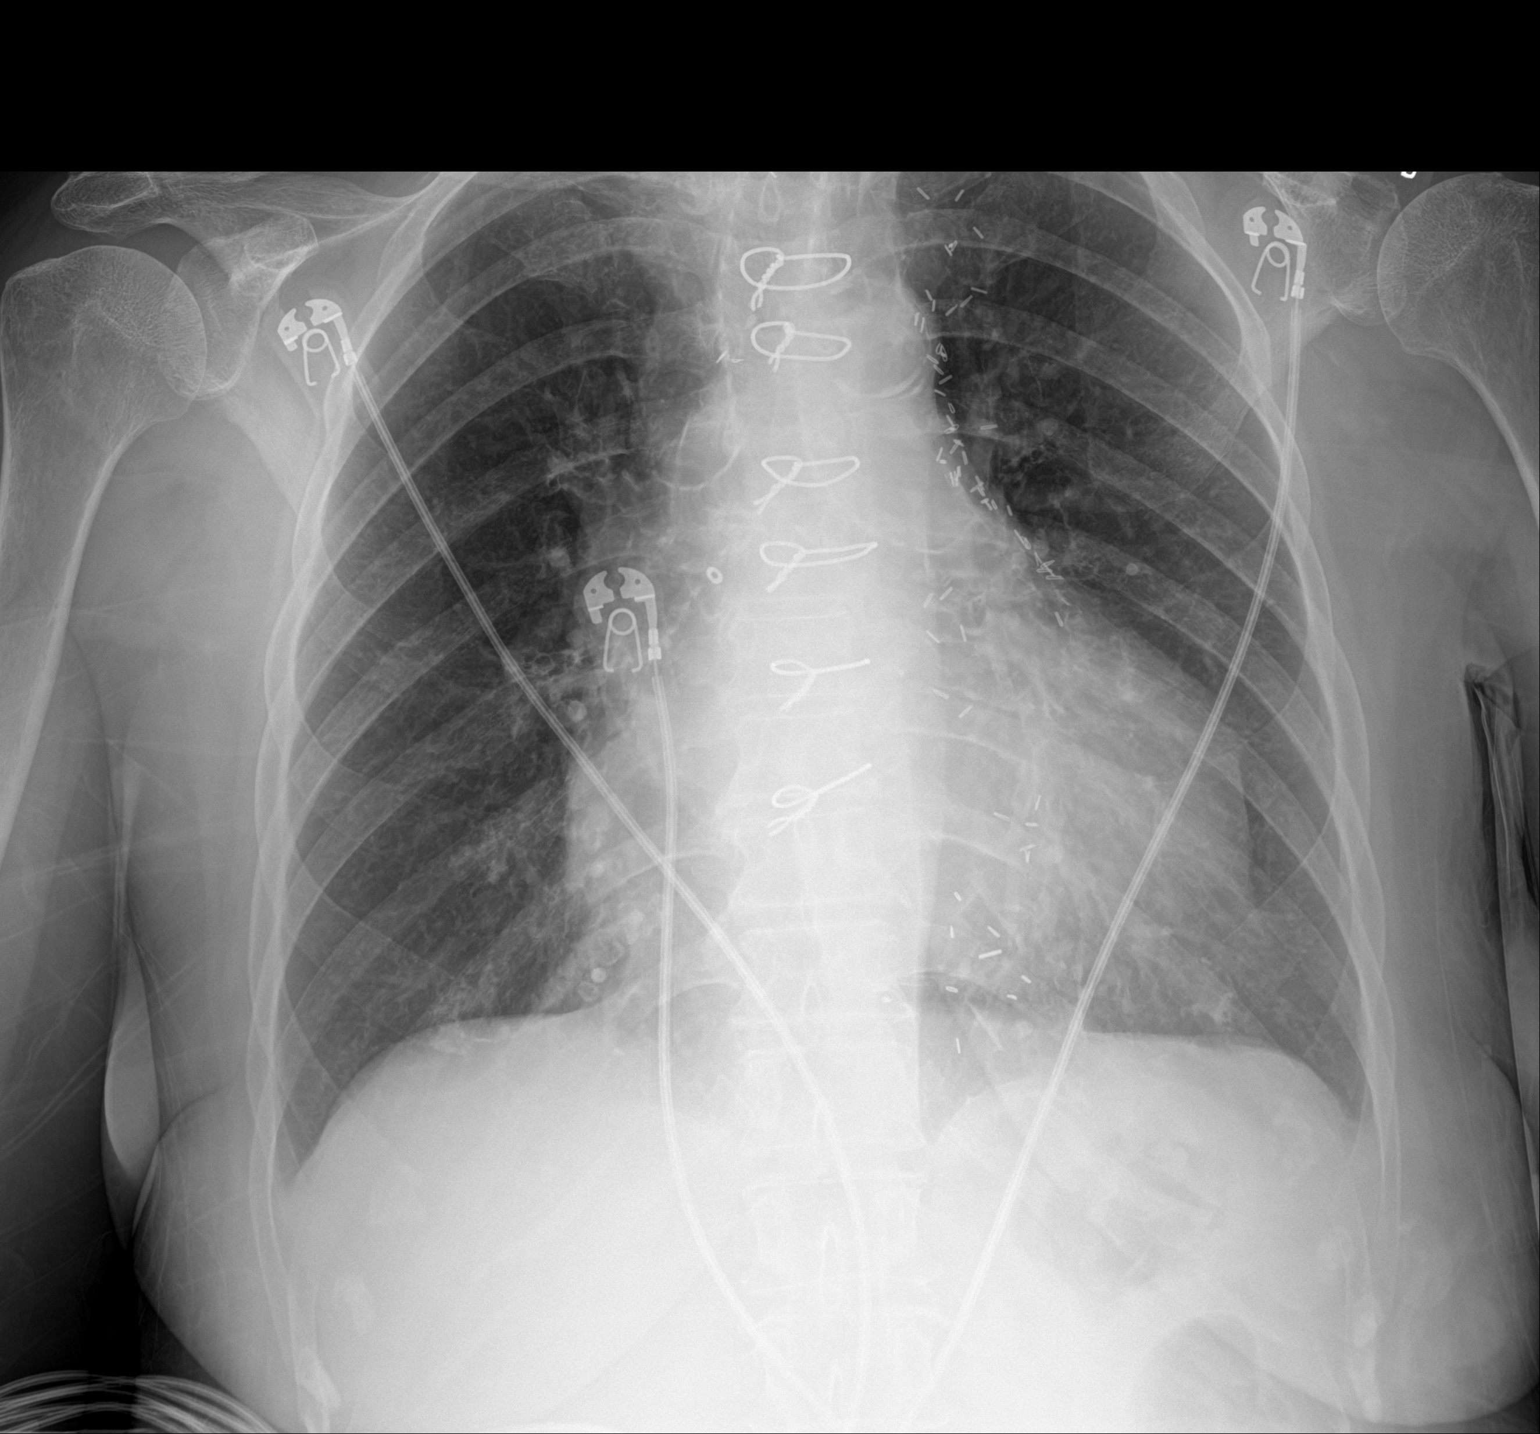

[1 of 1 positions shown; findings below may reference images not displayed]

FINDINGS: Stable mild cardiomegaly post CABG. Possible trace pleural
effusions. No pulmonary edema. No focal airspace disease. No
pneumothorax. No acute osseous abnormalities are seen.
IMPRESSION: 1. Possible trace pleural effusions.
2. Stable cardiomegaly post CABG.

Aortic Atherosclerosis ([id number]-[id number].E).

## 2021-08-10 ENCOUNTER — Other Ambulatory Visit: Payer: Self-pay | Admitting: Interventional Cardiology

## 2021-08-20 ENCOUNTER — Other Ambulatory Visit: Payer: Self-pay

## 2021-08-20 ENCOUNTER — Ambulatory Visit (INDEPENDENT_AMBULATORY_CARE_PROVIDER_SITE_OTHER): Payer: Medicare Other

## 2021-08-20 DIAGNOSIS — Z7901 Long term (current) use of anticoagulants: Secondary | ICD-10-CM | POA: Diagnosis not present

## 2021-08-20 DIAGNOSIS — Z5181 Encounter for therapeutic drug level monitoring: Secondary | ICD-10-CM | POA: Diagnosis not present

## 2021-08-20 DIAGNOSIS — I359 Nonrheumatic aortic valve disorder, unspecified: Secondary | ICD-10-CM | POA: Diagnosis not present

## 2021-08-20 LAB — POCT INR: INR: 2.7 (ref 2.0–3.0)

## 2021-08-20 NOTE — Patient Instructions (Signed)
Description   Continue on same dosage of Warfarin 1/2 tablet daily except 1 tablet on Mondays. Eat something green and leafy today.  Recheck INR in 3 weeks. Call our office if you have any problems or concerns [phone].

## 2021-09-10 ENCOUNTER — Ambulatory Visit (INDEPENDENT_AMBULATORY_CARE_PROVIDER_SITE_OTHER): Payer: Medicare Other

## 2021-09-10 ENCOUNTER — Other Ambulatory Visit: Payer: Self-pay

## 2021-09-10 DIAGNOSIS — Z952 Presence of prosthetic heart valve: Secondary | ICD-10-CM

## 2021-09-10 DIAGNOSIS — Z7901 Long term (current) use of anticoagulants: Secondary | ICD-10-CM | POA: Diagnosis not present

## 2021-09-10 DIAGNOSIS — Z5181 Encounter for therapeutic drug level monitoring: Secondary | ICD-10-CM

## 2021-09-10 DIAGNOSIS — I480 Paroxysmal atrial fibrillation: Secondary | ICD-10-CM | POA: Diagnosis not present

## 2021-09-10 LAB — POCT INR: INR: 1.8 — AB (ref 2.0–3.0)

## 2021-09-10 NOTE — Patient Instructions (Signed)
Description   Take 1 tablet today, then resume same dosage of Warfarin 1/2 tablet daily except 1 tablet on Mondays.  Recheck INR in 3 weeks. Call our office if you have any problems or concerns [phone].

## 2021-09-24 DIAGNOSIS — H43813 Vitreous degeneration, bilateral: Secondary | ICD-10-CM | POA: Diagnosis not present

## 2021-09-24 DIAGNOSIS — E113293 Type 2 diabetes mellitus with mild nonproliferative diabetic retinopathy without macular edema, bilateral: Secondary | ICD-10-CM | POA: Diagnosis not present

## 2021-09-24 DIAGNOSIS — H35371 Puckering of macula, right eye: Secondary | ICD-10-CM | POA: Diagnosis not present

## 2021-09-24 DIAGNOSIS — H353112 Nonexudative age-related macular degeneration, right eye, intermediate dry stage: Secondary | ICD-10-CM | POA: Diagnosis not present

## 2021-09-24 DIAGNOSIS — H353221 Exudative age-related macular degeneration, left eye, with active choroidal neovascularization: Secondary | ICD-10-CM | POA: Diagnosis not present

## 2021-10-01 ENCOUNTER — Other Ambulatory Visit: Payer: Self-pay

## 2021-10-01 ENCOUNTER — Ambulatory Visit (INDEPENDENT_AMBULATORY_CARE_PROVIDER_SITE_OTHER): Payer: Medicare Other

## 2021-10-01 DIAGNOSIS — Z7901 Long term (current) use of anticoagulants: Secondary | ICD-10-CM

## 2021-10-01 DIAGNOSIS — I480 Paroxysmal atrial fibrillation: Secondary | ICD-10-CM | POA: Diagnosis not present

## 2021-10-01 DIAGNOSIS — Z5181 Encounter for therapeutic drug level monitoring: Secondary | ICD-10-CM

## 2021-10-01 DIAGNOSIS — Z952 Presence of prosthetic heart valve: Secondary | ICD-10-CM | POA: Diagnosis not present

## 2021-10-01 LAB — POCT INR: INR: 1.9 — AB (ref 2.0–3.0)

## 2021-10-01 NOTE — Patient Instructions (Signed)
Description   Take 1 tablet today, then resume same dosage of Warfarin 1/2 tablet daily except 1 tablet on Mondays.  Recheck INR in 3 weeks. Call our office if you have any problems or concerns [phone].

## 2021-10-22 ENCOUNTER — Ambulatory Visit (INDEPENDENT_AMBULATORY_CARE_PROVIDER_SITE_OTHER): Payer: Medicare Other

## 2021-10-22 ENCOUNTER — Other Ambulatory Visit: Payer: Self-pay

## 2021-10-22 DIAGNOSIS — Z7901 Long term (current) use of anticoagulants: Secondary | ICD-10-CM

## 2021-10-22 DIAGNOSIS — I480 Paroxysmal atrial fibrillation: Secondary | ICD-10-CM | POA: Diagnosis not present

## 2021-10-22 DIAGNOSIS — Z952 Presence of prosthetic heart valve: Secondary | ICD-10-CM

## 2021-10-22 DIAGNOSIS — Z5181 Encounter for therapeutic drug level monitoring: Secondary | ICD-10-CM | POA: Diagnosis not present

## 2021-10-22 LAB — POCT INR: INR: 2.3 (ref 2.0–3.0)

## 2021-10-22 NOTE — Patient Instructions (Signed)
Description   Continue on same dosage of Warfarin 1/2 tablet daily except 1 tablet on Mondays.  Recheck INR in 3 weeks. Call our office if you have any problems or concerns [phone].

## 2021-11-12 ENCOUNTER — Ambulatory Visit (INDEPENDENT_AMBULATORY_CARE_PROVIDER_SITE_OTHER): Payer: Medicare Other

## 2021-11-12 ENCOUNTER — Other Ambulatory Visit: Payer: Self-pay

## 2021-11-12 DIAGNOSIS — Z952 Presence of prosthetic heart valve: Secondary | ICD-10-CM

## 2021-11-12 DIAGNOSIS — Z7901 Long term (current) use of anticoagulants: Secondary | ICD-10-CM | POA: Diagnosis not present

## 2021-11-12 DIAGNOSIS — I480 Paroxysmal atrial fibrillation: Secondary | ICD-10-CM | POA: Diagnosis not present

## 2021-11-12 DIAGNOSIS — Z5181 Encounter for therapeutic drug level monitoring: Secondary | ICD-10-CM

## 2021-11-12 LAB — POCT INR: INR: 2.1 (ref 2.0–3.0)

## 2021-11-12 NOTE — Patient Instructions (Signed)
Description   Continue on same dosage of Warfarin 1/2 tablet daily except 1 tablet on Mondays.  Recheck INR in 3 weeks. Call our office if you have any problems or concerns [phone].

## 2021-12-04 NOTE — Patient Outreach (Signed)
[name] [facility]) Care Management ? ?12/04/2021 ? ?[name] ?03-Jun-1933 ?[id number] ? ? ?Received referral for Care Management from Insurance plan. Assigned patient to Renee David, RN Care Coordinator for follow. ? ?[name] ?[place] Management Assistant ?[phone] ? ?

## 2021-12-05 ENCOUNTER — Other Ambulatory Visit: Payer: Self-pay | Admitting: *Deleted

## 2021-12-05 NOTE — Patient Outreach (Signed)
[facility]) Care Management ? ?12/05/2021 ? ?[name] ?Feb 02, 1933 ?[id number] ? ? ?Patient husband called back to have referral request from [employer] cancelled and requested that numbers be removed for [facility] not to call them any more. I have cancelled follow up for patient. ? ?Philmore Pali ?[place] Management Assistant ?[phone] ? ?

## 2021-12-05 NOTE — Patient Outreach (Addendum)
[facility]) Care Management ? ?12/05/2021 ? ?[name] ?[birth year] ?[id number] ? ? ?Referral Date: 3/15 ?Referral Source: Insurance ?Referral Reason: Chronic Care Management ?Insurance: BCBS ? ? ?Outreach attempt #1, unsuccessful, HIPAA compliant voice message left.   ? ?Plan: ?RN CM will send outreach letter and follow up within the next 3-4 business days. ? ? ? ? ? ? ?UPDATE @ 1430: ? ?Notified by CMA that member's husband reached out to main office and would like name removed from call list.  Will close case and will not follow up next week as planned. ? ?Valente David, RN, MSN, CCM ?[facility] Care Management  ?Community Care Manager ?[phone] ? ? ?

## 2021-12-05 NOTE — Progress Notes (Signed)
?  ?Cardiology Office Note ? ? ?Date:  12/06/2021  ? ?ID:  [name], DOB 1933/05/12, MRN [id number] ? ?PCP:  Gaynelle Arabian, MD  ? ? ?No chief complaint on file. ? ?CAD ? ?Wt Readings from Last 3 Encounters:  ?12/06/21 154 lb (69.9 kg)  ?05/04/20 141 lb (64 kg)  ?02/13/20 141 lb (64 kg)  ?  ? ?  ?History of Present Illness: ?[name] is a 88 y.o. female   with a hx of CAD and aortic valve disease status post CABG plus mechanical AVR done in [place] in the 1990s.  Subsequent PCI to the RCA with BMS, HTN, HL, diabetes, prior GI bleed, CKD, diastolic CHF, PAF.  ?  ?She was evaluated in 7/16 for anginal symptoms.  LHC demonstrated severe native three-vessel disease. LIMA-LAD was patent, SVG-diagonal patent, SVG-OM/PDA occluded. The second half of the OM/PDA graft was open. RCA stents were patent. She had an ostial RCA lesion of 75% which was treated with a Synergy DES. Because she needs Coumadin for her mechanical AVR, she was continued on Coumadin plus Plavix only (no aspirin). ?  ?She had some melena after the procedure.  She has had several episodes of GI bleeding in the past. ?  ?Patient had sepsis due to gram-negative bacteremia E. coli related to UTI 11/2018.  Echo with normal LV function moderate LVH normally functioning mechanical aortic valve possible mitral valve with small mobile vegetation in the anterior leaflet and small ASD suggested by Doppler.  TEE was negative for vegetations. ?Patient underwent successful DCCV 08/15/19 for paroxysmal atrial fibrillation.  Shortness of breath improved after this procedure. ?  ?She had more GI bleeding in February 2021.  Anticoagulation was held.  She underwent enteroscopy.  She had an argon laser treatment with EGD.  Hemoglobin was 7.3 at the time of discharge.  INR had slightly increased and was therapeutic by November 11, 2019.  It was recommended that aspirin be held for 4 weeks.  Due to bleeding risk, she has not been treated with theinopyridine, more  recently.  ?  ?At her appt in 2/21, she reported weakness and was found to be in a junctional rhythm.   We stopped metoprolol.  "SHe is on Amio to maintain NSR.  Now with junctional rhythm on ECG. May be related to anemia." ?  ?Repeat ECG in 3/21 showed sinus rhythm. ? ?Denies : Chest pain. Dizziness. Nitroglycerin use. Orthopnea. Palpitations. Paroxysmal nocturnal dyspnea. Shortness of breath. Syncope.   ? ?Home BP not being checked.   ? ? ? ? ? ?Past Medical History:  ?Diagnosis Date  ? Anemia   ? Anginal pain ([place])   ? Arteriovenous malformation of gastrointestinal tract   ? Arthritis   ? "fingers mostly" (04/12/2015)  ? Carotid artery disease ([place])   ? Carotid US 3/17:  34-19% RICA; 62-22% LICA; Elevated bilateral subclavian artery velocities >>f/u 1 year. // Carotid US 4/18: R 40-59; L 1-39 >> FU 1 year // Carotid US 10/2018: R 40-59; L 1-39, L subclavian stenosis   ? CHF (congestive heart failure) ([place])   ? Chronic kidney disease (CKD), stage III (moderate) (HCC)   ? Chronic lower GI bleeding   ? "today; last time was ~ 8 yr ago; used to have them often before that too" (02/11/2013)  ? Coronary artery disease   ? DJD (degenerative joint disease)   ? Dysrhythmia   ? Heart murmur   ? History of blood transfusion   ? "a  few times over the years; usually related to my Coumadin" (04/12/2015  ? History of gout   ? Hyperlipemia   ? Hypertension   ? Macula lutea degeneration   ? Old MI (myocardial infarction)   ? "a coulple /dr in 02/2008; I never even knew I'd had them" (04/12/2015)  ? Type II diabetes mellitus ([place])   ? ? ?Past Surgical History:  ?Procedure Laterality Date  ? AORTIC VALVE REPLACEMENT  1993  ? APPENDECTOMY  1953  ? BIOPSY  11/06/2019  ? Procedure: BIOPSY;  Surgeon: Doran Stabler, MD;  Location: [place];  Service: Gastroenterology;;  ? CARDIAC CATHETERIZATION  [phone]  ? CARDIAC CATHETERIZATION  04/12/2015  ? Procedure: Coronary Stent Intervention;  Surgeon: Jettie Booze, MD; ?   SYNERGY DES  3.5X16 to the ostial RCA   ? CARDIAC CATHETERIZATION  04/12/2015  ? Procedure: Coronary/Graft Angiography;  Surgeon: Eloy End, MD; LAD & CFX 100%, patent LIMA-LAD, SVG-D1; SVG-OM-PDA first limb 100%, 2nd limb patent; oRCA 75%>0 w/ stent  ? CARDIAC VALVE REPLACEMENT  1994  ? St. Jude/notes 10/29/2003 (02/11/2013)  ? CARDIOVERSION N/A 08/15/2019  ? Procedure: CARDIOVERSION;  Surgeon: Thayer Headings, MD;  Location: [place];  Service: Cardiovascular;  Laterality: N/A;  ? COLONOSCOPY N/A 02/13/2013  ? Procedure: COLONOSCOPY;  Surgeon: Lafayette Dragon, MD;  Location: [facility] ENDOSCOPY;  Service: Endoscopy;  Laterality: N/A;  ? CORONARY ANGIOPLASTY WITH STENT PLACEMENT  2009+  ? "3 at least; put in 1 stent at a time" (02/11/2013)  ? CORONARY ARTERY BYPASS GRAFT  1990  ? LIMA-LAD, SVG-OM-PDA, SVG-D1  ? DILATION AND CURETTAGE OF UTERUS  1960's  ? 'after a miscarriage" (02/11/2013)  ? ENTEROSCOPY N/A 02/13/2013  ? Procedure: ENTEROSCOPY;  Surgeon: Lafayette Dragon, MD;  Location: [facility] ENDOSCOPY;  Service: Endoscopy;  Laterality: N/A;  ? ENTEROSCOPY N/A 11/06/2019  ? Procedure: ENTEROSCOPY;  Surgeon: Doran Stabler, MD;  Location: [place];  Service: Gastroenterology;  Laterality: N/A;  ? ESOPHAGOGASTRODUODENOSCOPY (EGD) WITH PROPOFOL N/A 12/01/2019  ? Procedure: ESOPHAGOGASTRODUODENOSCOPY (EGD) WITH PROPOFOL;  Surgeon: Mauri Pole, MD;  Location: [place] ENDOSCOPY;  Service: Endoscopy;  Laterality: N/A;  ? EXPLORATORY LAPAROTOMY  02/24/2008  ? which revealed a retroperitoneal hematoma and bleeding from the right external iliac artery/notes 03/02/2008 (02/11/2013)   ? FLEXIBLE SIGMOIDOSCOPY N/A 04/21/2015  ? Procedure: FLEXIBLE SIGMOIDOSCOPY;  Surgeon: Ladene Artist, MD;  Location[facility] ENDOSCOPY;  Service: Endoscopy;  Laterality: N/A;  ? HOT HEMOSTASIS N/A 11/06/2019  ? Procedure: HOT HEMOSTASIS (ARGON PLASMA COAGULATION/BICAP);  Surgeon: Doran Stabler, MD;  Location: [place];  Service: Gastroenterology;  Laterality:  N/A;  ? TEE WITHOUT CARDIOVERSION N/A 12/01/2018  ? Procedure: TRANSESOPHAGEAL ECHOCARDIOGRAM (TEE);  Surgeon: Jerline Pain, MD;  Location: [facility] ENDOSCOPY;  Service: Cardiovascular;  Laterality: N/A;  ? ? ? ?Current Outpatient Medications  ?Medication Sig Dispense Refill  ? amiodarone (PACERONE) 200 MG tablet Take 1 tablet (200 mg total) by mouth daily. 90 tablet 1  ? atorvastatin (LIPITOR) 80 MG tablet Take 1 tablet (80 mg total) by mouth daily. 90 tablet 1  ? bevacizumab (AVASTIN) 1.25 mg/0.1 mL SOLN Place 1.25 mg into the left eye every 8 (eight) weeks.    ? ezetimibe (ZETIA) 10 MG tablet TAKE 1 TABLET BY MOUTH ONCE DAILY 90 tablet 1  ? FeFum-FePo-FA-B Cmp-C-Zn-Mn-Cu (SE-TAN PLUS) 162-115.2-1 MG CAPS Take 1 capsule by mouth 2 (two) times daily.   3  ? folic acid (FOLVITE) 700 MCG  tablet Take 400 mcg by mouth every evening.     ? furosemide (LASIX) 40 MG tablet Take 40 mg by mouth daily.    ? isosorbide mononitrate (IMDUR) 60 MG 24 hr tablet TAKE 1 TABLET BY MOUTH ONCE DAILY * 90 tablet 1  ? JANTOVEN 5 MG tablet TAKE 1/2 TO 1 (ONE-HALF TO ONE) TABLET BY MOUTH ONCE DAILY AS  DIRECTED  BY  COUMADIN  CLINIC 75 tablet 1  ? losartan (COZAAR) 50 MG tablet Take 50 mg by mouth daily.    ? Multiple Vitamin (MULTIVITAMIN WITH MINERALS) TABS Take 1 tablet by mouth daily.    ? Multiple Vitamins-Minerals (PRESERVISION AREDS 2 PO) Take 1 tablet by mouth 2 (two) times daily.     ? nitroGLYCERIN (NITROSTAT) 0.4 MG SL tablet DISSOLVE ONE TABLET UNDER THE TONGUE EVERY 5 MINUTES AS NEEDED FOR CHEST PAIN.  DO NOT EXCEED A TOTAL OF 3 DOSES IN 15 MINUTES 25 tablet 5  ? Omega-3 Fatty Acids (FISH OIL PO) Take 2 capsules by mouth in the morning and at bedtime.     ? ?No current facility-administered medications for this visit.  ? ? ?Allergies:   Darvon [propoxyphene], Lisinopril, Septra [sulfamethoxazole-trimethoprim], Sulfamethoxazole-trimethoprim, Warfarin and related, and Penicillins  ? ? ?Social History:  The patient  reports that she  has never smoked. She has never used smokeless tobacco. She reports that she does not drink alcohol and does not use drugs.  ? ?Family History:  The patient's family history includes Heart disease in her

## 2021-12-06 ENCOUNTER — Ambulatory Visit (INDEPENDENT_AMBULATORY_CARE_PROVIDER_SITE_OTHER): Payer: Medicare Other | Admitting: Interventional Cardiology

## 2021-12-06 ENCOUNTER — Ambulatory Visit (INDEPENDENT_AMBULATORY_CARE_PROVIDER_SITE_OTHER): Payer: Medicare Other

## 2021-12-06 ENCOUNTER — Encounter: Payer: Self-pay | Admitting: Interventional Cardiology

## 2021-12-06 ENCOUNTER — Other Ambulatory Visit: Payer: Self-pay

## 2021-12-06 VITALS — BP 160/72 | HR 87 | Ht 59.0 in | Wt 154.0 lb

## 2021-12-06 DIAGNOSIS — Z952 Presence of prosthetic heart valve: Secondary | ICD-10-CM | POA: Diagnosis not present

## 2021-12-06 DIAGNOSIS — I10 Essential (primary) hypertension: Secondary | ICD-10-CM

## 2021-12-06 DIAGNOSIS — I480 Paroxysmal atrial fibrillation: Secondary | ICD-10-CM

## 2021-12-06 DIAGNOSIS — Z7901 Long term (current) use of anticoagulants: Secondary | ICD-10-CM | POA: Diagnosis not present

## 2021-12-06 DIAGNOSIS — I5032 Chronic diastolic (congestive) heart failure: Secondary | ICD-10-CM

## 2021-12-06 DIAGNOSIS — I2510 Atherosclerotic heart disease of native coronary artery without angina pectoris: Secondary | ICD-10-CM | POA: Diagnosis not present

## 2021-12-06 DIAGNOSIS — Z5181 Encounter for therapeutic drug level monitoring: Secondary | ICD-10-CM

## 2021-12-06 LAB — POCT INR: INR: 1.8 — AB (ref 2.0–3.0)

## 2021-12-06 NOTE — Patient Instructions (Signed)
Medication Instructions:  ?Your physician recommends that you continue on your current medications as directed. Please refer to the Current Medication list given to you today. ? ?*If you need a refill on your cardiac medications before your next appointment, please call your pharmacy* ? ? ?Lab Work: ?none ?If you have labs (blood work) drawn today and your tests are completely normal, you will receive your results only by: ?MyChart Message (if you have MyChart) OR ?A paper copy in the mail ?If you have any lab test that is abnormal or we need to change your treatment, we will call you to review the results. ? ? ?Testing/Procedures: ?none ? ? ?Follow-Up: ?At [facility], you and your health needs are our priority.  As part of our continuing mission to provide you with exceptional heart care, we have created designated Provider Care Teams.  These Care Teams include your primary Cardiologist (physician) and Advanced Practice Providers (APPs -  Physician Assistants and Nurse Practitioners) who all work together to provide you with the care you need, when you need it. ? ?We recommend signing up for the patient portal called "MyChart".  Sign up information is provided on this After Visit Summary.  MyChart is used to connect with patients for Virtual Visits (Telemedicine).  Patients are able to view lab/test results, encounter notes, upcoming appointments, etc.  Non-urgent messages can be sent to your provider as well.   ?To learn more about what you can do with MyChart, go to [email or web address].   ? ?Your next appointment:   ?12 month(s) ? ?The format for your next appointment:   ?In Person ? ?Provider:   ?Larae Grooms, MD   ? ? ?Other Instructions ?Check blood pressure at home and keep record of readings.  Send readings in through my chart for Dr Irish Lack to review.  ? ?

## 2021-12-06 NOTE — Patient Instructions (Signed)
-   take 1 whole tablet tonight, then ?- Continue on same dosage of Warfarin 1/2 tablet daily except 1 tablet on Mondays.   ? -Recheck INR in 3 weeks.  ? Call our office if you have any problems or concerns [phone].  ?

## 2021-12-10 ENCOUNTER — Ambulatory Visit: Payer: Medicare Other | Admitting: *Deleted

## 2021-12-24 DIAGNOSIS — H353112 Nonexudative age-related macular degeneration, right eye, intermediate dry stage: Secondary | ICD-10-CM | POA: Diagnosis not present

## 2021-12-24 DIAGNOSIS — H35371 Puckering of macula, right eye: Secondary | ICD-10-CM | POA: Diagnosis not present

## 2021-12-24 DIAGNOSIS — E113293 Type 2 diabetes mellitus with mild nonproliferative diabetic retinopathy without macular edema, bilateral: Secondary | ICD-10-CM | POA: Diagnosis not present

## 2021-12-24 DIAGNOSIS — H353221 Exudative age-related macular degeneration, left eye, with active choroidal neovascularization: Secondary | ICD-10-CM | POA: Diagnosis not present

## 2021-12-31 ENCOUNTER — Ambulatory Visit (INDEPENDENT_AMBULATORY_CARE_PROVIDER_SITE_OTHER): Payer: Medicare Other

## 2021-12-31 DIAGNOSIS — Z7901 Long term (current) use of anticoagulants: Secondary | ICD-10-CM

## 2021-12-31 DIAGNOSIS — Z952 Presence of prosthetic heart valve: Secondary | ICD-10-CM

## 2021-12-31 DIAGNOSIS — I480 Paroxysmal atrial fibrillation: Secondary | ICD-10-CM

## 2021-12-31 DIAGNOSIS — Z5181 Encounter for therapeutic drug level monitoring: Secondary | ICD-10-CM | POA: Diagnosis not present

## 2021-12-31 LAB — POCT INR: INR: 1.8 — AB (ref 2.0–3.0)

## 2021-12-31 NOTE — Patient Instructions (Signed)
Description   ?Start taking 1/2 tablet daily except 1 tablet on Mondays and Fridays. Recheck INR in 2 weeks. Call our office if you have any problems or concerns [phone].  ? ? ?  ?  ?

## 2022-01-02 ENCOUNTER — Encounter: Payer: Self-pay | Admitting: Interventional Cardiology

## 2022-01-12 ENCOUNTER — Other Ambulatory Visit: Payer: Self-pay | Admitting: Interventional Cardiology

## 2022-01-14 ENCOUNTER — Ambulatory Visit (INDEPENDENT_AMBULATORY_CARE_PROVIDER_SITE_OTHER): Payer: Medicare Other | Admitting: Pharmacist

## 2022-01-14 DIAGNOSIS — I480 Paroxysmal atrial fibrillation: Secondary | ICD-10-CM | POA: Diagnosis not present

## 2022-01-14 DIAGNOSIS — Z5181 Encounter for therapeutic drug level monitoring: Secondary | ICD-10-CM

## 2022-01-14 DIAGNOSIS — Z952 Presence of prosthetic heart valve: Secondary | ICD-10-CM

## 2022-01-14 DIAGNOSIS — Z7901 Long term (current) use of anticoagulants: Secondary | ICD-10-CM | POA: Diagnosis not present

## 2022-01-14 LAB — POCT INR: INR: 2.6 (ref 2.0–3.0)

## 2022-01-14 NOTE — Patient Instructions (Signed)
Eat a small serving of greens today and continue taking 1/2 tablet daily except 1 tablet on Mondays and Fridays. Recheck INR in 2 weeks. Call our office if you have any problems or concerns [phone].  ?

## 2022-01-21 DIAGNOSIS — I663 Occlusion and stenosis of cerebellar arteries: Secondary | ICD-10-CM | POA: Diagnosis not present

## 2022-01-21 DIAGNOSIS — H353221 Exudative age-related macular degeneration, left eye, with active choroidal neovascularization: Secondary | ICD-10-CM | POA: Diagnosis not present

## 2022-01-21 DIAGNOSIS — E1129 Type 2 diabetes mellitus with other diabetic kidney complication: Secondary | ICD-10-CM | POA: Diagnosis not present

## 2022-01-21 DIAGNOSIS — I2510 Atherosclerotic heart disease of native coronary artery without angina pectoris: Secondary | ICD-10-CM | POA: Diagnosis not present

## 2022-01-21 DIAGNOSIS — I509 Heart failure, unspecified: Secondary | ICD-10-CM | POA: Diagnosis not present

## 2022-01-21 DIAGNOSIS — E113293 Type 2 diabetes mellitus with mild nonproliferative diabetic retinopathy without macular edema, bilateral: Secondary | ICD-10-CM | POA: Diagnosis not present

## 2022-01-21 DIAGNOSIS — I130 Hypertensive heart and chronic kidney disease with heart failure and stage 1 through stage 4 chronic kidney disease, or unspecified chronic kidney disease: Secondary | ICD-10-CM | POA: Diagnosis not present

## 2022-01-21 DIAGNOSIS — N183 Chronic kidney disease, stage 3 unspecified: Secondary | ICD-10-CM | POA: Diagnosis not present

## 2022-01-21 DIAGNOSIS — E7800 Pure hypercholesterolemia, unspecified: Secondary | ICD-10-CM | POA: Diagnosis not present

## 2022-01-21 DIAGNOSIS — E1139 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: Secondary | ICD-10-CM | POA: Diagnosis not present

## 2022-01-21 DIAGNOSIS — D649 Anemia, unspecified: Secondary | ICD-10-CM | POA: Diagnosis not present

## 2022-01-21 DIAGNOSIS — I480 Paroxysmal atrial fibrillation: Secondary | ICD-10-CM | POA: Diagnosis not present

## 2022-01-31 ENCOUNTER — Ambulatory Visit (INDEPENDENT_AMBULATORY_CARE_PROVIDER_SITE_OTHER): Payer: Medicare Other | Admitting: *Deleted

## 2022-01-31 DIAGNOSIS — I480 Paroxysmal atrial fibrillation: Secondary | ICD-10-CM | POA: Diagnosis not present

## 2022-01-31 DIAGNOSIS — Z952 Presence of prosthetic heart valve: Secondary | ICD-10-CM

## 2022-01-31 DIAGNOSIS — Z7901 Long term (current) use of anticoagulants: Secondary | ICD-10-CM

## 2022-01-31 LAB — POCT INR: INR: 3.1 — AB (ref 2.0–3.0)

## 2022-01-31 NOTE — Patient Instructions (Signed)
Description   ?Today take 1/2 tablet and have some leafy veggies today then start taking 1/2 tablet daily except 1 tablet on Mondays. Recheck INR in 2 weeks. Call our office if you have any problems or concerns [phone].  ? ? ?  ?  ?

## 2022-02-01 ENCOUNTER — Other Ambulatory Visit: Payer: Self-pay | Admitting: Interventional Cardiology

## 2022-02-08 ENCOUNTER — Other Ambulatory Visit: Payer: Self-pay | Admitting: Interventional Cardiology

## 2022-02-14 ENCOUNTER — Ambulatory Visit (INDEPENDENT_AMBULATORY_CARE_PROVIDER_SITE_OTHER): Payer: Medicare Other | Admitting: *Deleted

## 2022-02-14 DIAGNOSIS — Z952 Presence of prosthetic heart valve: Secondary | ICD-10-CM

## 2022-02-14 DIAGNOSIS — Z5181 Encounter for therapeutic drug level monitoring: Secondary | ICD-10-CM

## 2022-02-14 DIAGNOSIS — Z7901 Long term (current) use of anticoagulants: Secondary | ICD-10-CM | POA: Diagnosis not present

## 2022-02-14 DIAGNOSIS — I480 Paroxysmal atrial fibrillation: Secondary | ICD-10-CM

## 2022-02-14 LAB — POCT INR: INR: 2.2 (ref 2.0–3.0)

## 2022-02-14 NOTE — Patient Instructions (Signed)
Description   Continue taking 1/2 tablet daily except 1 tablet on Mondays. Recheck INR in 3 weeks. Call our office if you have any problems or concerns [phone].

## 2022-02-27 ENCOUNTER — Ambulatory Visit (HOSPITAL_COMMUNITY)
Admission: RE | Admit: 2022-02-27 | Discharge: 2022-02-27 | Disposition: A | Discharge status: Home / Self Care | Payer: Medicare Other | Source: Ambulatory Visit | Attending: Family Medicine | Admitting: Family Medicine

## 2022-02-27 ENCOUNTER — Other Ambulatory Visit (HOSPITAL_COMMUNITY): Payer: Self-pay | Admitting: Family Medicine

## 2022-02-27 ENCOUNTER — Emergency Department (HOSPITAL_COMMUNITY): Admission: EM | Admit: 2022-02-27 | Payer: Medicare Other | Source: Home / Self Care

## 2022-02-27 ENCOUNTER — Ambulatory Visit (HOSPITAL_COMMUNITY)
Admit: 2022-02-27 | Discharge: 2022-02-27 | Disposition: A | Discharge status: Home / Self Care | Payer: Medicare Other | Source: Other Acute Inpatient Hospital

## 2022-02-27 DIAGNOSIS — M545 Low back pain, unspecified: Secondary | ICD-10-CM | POA: Diagnosis not present

## 2022-02-27 DIAGNOSIS — M1612 Unilateral primary osteoarthritis, left hip: Secondary | ICD-10-CM | POA: Diagnosis not present

## 2022-02-27 DIAGNOSIS — R52 Pain, unspecified: Secondary | ICD-10-CM | POA: Diagnosis not present

## 2022-02-27 DIAGNOSIS — M25552 Pain in left hip: Secondary | ICD-10-CM | POA: Diagnosis not present

## 2022-02-27 DIAGNOSIS — M549 Dorsalgia, unspecified: Secondary | ICD-10-CM | POA: Diagnosis not present

## 2022-02-27 DIAGNOSIS — M79605 Pain in left leg: Secondary | ICD-10-CM | POA: Diagnosis not present

## 2022-02-27 DIAGNOSIS — K8020 Calculus of gallbladder without cholecystitis without obstruction: Secondary | ICD-10-CM | POA: Diagnosis not present

## 2022-02-27 DIAGNOSIS — M47816 Spondylosis without myelopathy or radiculopathy, lumbar region: Secondary | ICD-10-CM | POA: Diagnosis not present

## 2022-03-08 ENCOUNTER — Other Ambulatory Visit: Payer: Self-pay | Admitting: Interventional Cardiology

## 2022-03-09 ENCOUNTER — Emergency Department (HOSPITAL_COMMUNITY): Payer: Medicare Other

## 2022-03-09 ENCOUNTER — Emergency Department (HOSPITAL_COMMUNITY)
Admission: EM | Admit: 2022-03-09 | Discharge: 2022-03-09 | Disposition: A | Discharge status: Home / Self Care | Payer: Medicare Other | Attending: Emergency Medicine | Admitting: Emergency Medicine

## 2022-03-09 DIAGNOSIS — N183 Chronic kidney disease, stage 3 unspecified: Secondary | ICD-10-CM | POA: Diagnosis not present

## 2022-03-09 DIAGNOSIS — M79673 Pain in unspecified foot: Secondary | ICD-10-CM | POA: Diagnosis not present

## 2022-03-09 DIAGNOSIS — Z79899 Other long term (current) drug therapy: Secondary | ICD-10-CM | POA: Diagnosis not present

## 2022-03-09 DIAGNOSIS — E1122 Type 2 diabetes mellitus with diabetic chronic kidney disease: Secondary | ICD-10-CM | POA: Insufficient documentation

## 2022-03-09 DIAGNOSIS — S82832A Other fracture of upper and lower end of left fibula, initial encounter for closed fracture: Secondary | ICD-10-CM | POA: Insufficient documentation

## 2022-03-09 DIAGNOSIS — M8588 Other specified disorders of bone density and structure, other site: Secondary | ICD-10-CM | POA: Diagnosis not present

## 2022-03-09 DIAGNOSIS — S99912A Unspecified injury of left ankle, initial encounter: Secondary | ICD-10-CM | POA: Diagnosis present

## 2022-03-09 DIAGNOSIS — S8992XA Unspecified injury of left lower leg, initial encounter: Secondary | ICD-10-CM | POA: Diagnosis not present

## 2022-03-09 DIAGNOSIS — M549 Dorsalgia, unspecified: Secondary | ICD-10-CM | POA: Diagnosis not present

## 2022-03-09 DIAGNOSIS — J323 Chronic sphenoidal sinusitis: Secondary | ICD-10-CM | POA: Diagnosis not present

## 2022-03-09 DIAGNOSIS — W19XXXA Unspecified fall, initial encounter: Secondary | ICD-10-CM | POA: Diagnosis not present

## 2022-03-09 DIAGNOSIS — R296 Repeated falls: Secondary | ICD-10-CM | POA: Insufficient documentation

## 2022-03-09 DIAGNOSIS — Z7901 Long term (current) use of anticoagulants: Secondary | ICD-10-CM | POA: Diagnosis not present

## 2022-03-09 DIAGNOSIS — I509 Heart failure, unspecified: Secondary | ICD-10-CM | POA: Insufficient documentation

## 2022-03-09 DIAGNOSIS — S022XXA Fracture of nasal bones, initial encounter for closed fracture: Secondary | ICD-10-CM | POA: Diagnosis not present

## 2022-03-09 DIAGNOSIS — I2510 Atherosclerotic heart disease of native coronary artery without angina pectoris: Secondary | ICD-10-CM | POA: Diagnosis not present

## 2022-03-09 DIAGNOSIS — Y92000 Kitchen of unspecified non-institutional (private) residence as  the place of occurrence of the external cause: Secondary | ICD-10-CM | POA: Diagnosis not present

## 2022-03-09 DIAGNOSIS — I130 Hypertensive heart and chronic kidney disease with heart failure and stage 1 through stage 4 chronic kidney disease, or unspecified chronic kidney disease: Secondary | ICD-10-CM | POA: Insufficient documentation

## 2022-03-09 DIAGNOSIS — M25572 Pain in left ankle and joints of left foot: Secondary | ICD-10-CM | POA: Diagnosis not present

## 2022-03-09 DIAGNOSIS — I672 Cerebral atherosclerosis: Secondary | ICD-10-CM | POA: Diagnosis not present

## 2022-03-09 DIAGNOSIS — W01198A Fall on same level from slipping, tripping and stumbling with subsequent striking against other object, initial encounter: Secondary | ICD-10-CM | POA: Insufficient documentation

## 2022-03-09 DIAGNOSIS — M545 Low back pain, unspecified: Secondary | ICD-10-CM | POA: Diagnosis not present

## 2022-03-09 DIAGNOSIS — M7989 Other specified soft tissue disorders: Secondary | ICD-10-CM | POA: Diagnosis not present

## 2022-03-09 LAB — BASIC METABOLIC PANEL
Anion gap: 15 (ref 5–15)
BUN: 45 mg/dL — ABNORMAL HIGH (ref 8–23)
CO2: 21 mmol/L — ABNORMAL LOW (ref 22–32)
Calcium: 9.5 mg/dL (ref 8.9–10.3)
Chloride: 99 mmol/L (ref 98–111)
Creatinine, Ser: 1.75 mg/dL — ABNORMAL HIGH (ref 0.44–1.00)
GFR, Estimated: 28 mL/min — ABNORMAL LOW (ref >60)
Glucose, Bld: 120 mg/dL — ABNORMAL HIGH (ref 70–99)
Potassium: 4.1 mmol/L (ref 3.5–5.1)
Sodium: 135 mmol/L (ref 135–145)

## 2022-03-09 LAB — CBC WITH DIFFERENTIAL/PLATELET
Abs Immature Granulocytes: 0.01 10*3/uL (ref 0.00–0.07)
Basophils Absolute: 0 10*3/uL (ref 0.0–0.1)
Basophils Relative: 1 %
Eosinophils Absolute: 0.1 10*3/uL (ref 0.0–0.5)
Eosinophils Relative: 1 %
HCT: 36.6 % (ref 36.0–46.0)
Hemoglobin: 12 g/dL (ref 12.0–15.0)
Immature Granulocytes: 0 %
Lymphocytes Relative: 35 %
Lymphs Abs: 1.7 10*3/uL (ref 0.7–4.0)
MCH: 31.6 pg (ref 26.0–34.0)
MCHC: 32.8 g/dL (ref 30.0–36.0)
MCV: 96.3 fL (ref 80.0–100.0)
Monocytes Absolute: 0.7 10*3/uL (ref 0.1–1.0)
Monocytes Relative: 14 %
Neutro Abs: 2.4 10*3/uL (ref 1.7–7.7)
Neutrophils Relative %: 49 %
Platelets: 200 10*3/uL (ref 150–400)
RBC: 3.8 MIL/uL — ABNORMAL LOW (ref 3.87–5.11)
RDW: 13.5 % (ref 11.5–15.5)
WBC: 4.8 10*3/uL (ref 4.0–10.5)
nRBC: 0 % (ref 0.0–0.2)

## 2022-03-09 LAB — PROTIME-INR
INR: 2.3 — ABNORMAL HIGH (ref 0.8–1.2)
Prothrombin Time: 25.1 seconds — ABNORMAL HIGH (ref 11.4–15.2)

## 2022-03-09 MED ORDER — OXYCODONE-ACETAMINOPHEN 5-325 MG PO TABS: 1.0000 | ORAL_TABLET | Freq: Four times a day (QID) | ORAL | 0 refills | Status: DC | PRN

## 2022-03-09 MED ORDER — HYDROCODONE-ACETAMINOPHEN 5-325 MG PO TABS
1.0000 | ORAL_TABLET | Freq: Once | ORAL | Status: AC
  Administered 2022-03-09: 1 via ORAL
  Filled 2022-03-09: qty 1

## 2022-03-09 NOTE — ED Notes (Addendum)
Ambulated pt with walker. pt stated her left knee hurt and favored right leg, but otherwise had steady gait. Pt stated she walked almost as well as she normally does

## 2022-03-09 NOTE — ED Triage Notes (Addendum)
BIB [facility] from home. Fell 3 days ago- pain to left ankle. Swelling and pain became too much- called EMS. HX of several falls over past few months. Also reports lower back pain. On coumadin. Did hit head. No LOC. Pt describes legs ' giving out suddenly'- not tripping. Aox4.

## 2022-03-09 NOTE — Discharge Instructions (Signed)
You were seen today and have a fracture of the left fibula.  This is the nonweightbearing bone of the leg.  You will be placed in a cam walker.  Use your walker at home.  Follow-up with orthopedics.  Take medication as prescribed.  Know that this medication can make you more sleepy.

## 2022-03-09 NOTE — ED Provider Notes (Signed)
[facility] EMERGENCY DEPARTMENT Provider Note   CSN: [id number] Arrival date & time: 03/09/22  0039     History  Chief Complaint  Patient presents with   [name] is a 88 y.o. female.  HPI     This is an 88 year old female with a history of valve replacement and A-fib on Coumadin who presents following a fall.  Patient reports that she fell in her kitchen proximately 3 days ago.  She describes sometimes her legs "just giving out."  She denies any loss of consciousness.  Patient states that she hurt her left ankle, knee, and has had some back pain.  Has noted progressive swelling and pain in the left ankle.  This is what provoked her to be evaluated.  She states that she did hit her head when she fell but did not lose consciousness.  She states she had a lump on the back of her head that has improved.  Denies dizziness.  Denies chest pain.  Home Medications Prior to Admission medications   Medication Sig Start Date End Date Taking? Authorizing Provider  oxyCODONE-acetaminophen (PERCOCET/ROXICET) 5-325 MG tablet Take 1 tablet by mouth every 6 (six) hours as needed for severe pain. 03/09/22  Yes Destry Bezdek, Barbette Hair, MD  amiodarone (PACERONE) 200 MG tablet Take 1 tablet (200 mg total) by mouth daily. 08/02/21   Jettie Booze, MD  atorvastatin (LIPITOR) 80 MG tablet Take 1 tablet by mouth once daily 02/10/22   Jettie Booze, MD  bevacizumab (AVASTIN) 1.25 mg/0.1 mL SOLN Place 1.25 mg into the left eye every 8 (eight) weeks.    [provider]  ezetimibe (ZETIA) 10 MG tablet Take 1 tablet by mouth once daily 02/03/22   Jettie Booze, MD  FeFum-FePo-FA-B Cmp-C-Zn-Mn-Cu (SE-TAN PLUS) 162-115.2-1 MG CAPS Take 1 capsule by mouth 2 (two) times daily.  05/22/15   [provider]  folic acid (FOLVITE) 814 MCG tablet Take 400 mcg by mouth every evening.     [provider]  furosemide (LASIX) 40 MG tablet Take 40 mg by  mouth daily. 11/19/19   [provider]  isosorbide mononitrate (IMDUR) 60 MG 24 hr tablet Take 1 tablet by mouth once daily 02/10/22   Jettie Booze, MD  JANTOVEN 5 MG tablet TAKE 1/2 TO 1 (ONE-HALF TO ONE) TABLET BY MOUTH ONCE DAILY AS DIRECTED BY  COUMADIN  CLINIC 01/13/22   Jettie Booze, MD  losartan (COZAAR) 50 MG tablet Take 50 mg by mouth daily.    [provider]  Multiple Vitamin (MULTIVITAMIN WITH MINERALS) TABS Take 1 tablet by mouth daily.    [provider]  Multiple Vitamins-Minerals (PRESERVISION AREDS 2 PO) Take 1 tablet by mouth 2 (two) times daily.     [provider]  nitroGLYCERIN (NITROSTAT) 0.4 MG SL tablet DISSOLVE ONE TABLET UNDER THE TONGUE EVERY 5 MINUTES AS NEEDED FOR CHEST PAIN.  DO NOT EXCEED A TOTAL OF 3 DOSES IN 15 MINUTES 02/14/20   Jettie Booze, MD  Omega-3 Fatty Acids (FISH OIL PO) Take 2 capsules by mouth in the morning and at bedtime.     [provider]      Allergies    Darvon [propoxyphene], Lisinopril, Septra [sulfamethoxazole-trimethoprim], Sulfamethoxazole-trimethoprim, Warfarin and related, and Penicillins    Review of Systems   Review of Systems  Constitutional:  Negative for fever.  Musculoskeletal:  Positive for back pain.  Left ankle pain, left knee pain  All other systems reviewed and are negative.   Physical Exam Updated Vital Signs BP (!) 167/64   Pulse 81   Temp (!) 97.5 F (36.4 C) (Oral)   Resp 16   Ht 1.524 m (5')   Wt 68 kg   SpO2 94%   BMI 29.29 kg/m  Physical Exam Vitals and nursing note reviewed.  Constitutional:      Appearance: She is well-developed. She is not ill-appearing.     Comments: ABCs intact  HENT:     Head: Normocephalic and atraumatic.     Mouth/Throat:     Mouth: Mucous membranes are moist.  Eyes:     Pupils: Pupils are equal, round, and reactive to light.  Neck:     Comments: No midline C-spine tenderness to palpation, step-off,  deformity Cardiovascular:     Rate and Rhythm: Normal rate and regular rhythm.     Heart sounds: Murmur heard.     Comments: Scarring anterior chest Pulmonary:     Effort: Pulmonary effort is normal. No respiratory distress.     Breath sounds: No wheezing.  Abdominal:     Palpations: Abdomen is soft.  Musculoskeletal:     Cervical back: Neck supple.     Comments: Swelling noted of the left foot and ankle with ecchymosis and bruising laterally, no obvious deformity, 1+ DP pulse, bruising noted over the anterior knee, tenderness palpation lower lumbar spine without step-off or deformity noted  Skin:    General: Skin is warm and dry.  Neurological:     Mental Status: She is alert and oriented to person, place, and time.  Psychiatric:        Mood and Affect: Mood normal.     ED Results / Procedures / Treatments   Labs (all labs ordered are listed, but only abnormal results are displayed) Labs Reviewed  CBC WITH DIFFERENTIAL/PLATELET - Abnormal; Notable for the following components:      Result Value   RBC 3.80 (*)    All other components within normal limits  BASIC METABOLIC PANEL - Abnormal; Notable for the following components:   CO2 21 (*)    Glucose, Bld 120 (*)    BUN 45 (*)    Creatinine, Ser 1.75 (*)    GFR, Estimated 28 (*)    All other components within normal limits  PROTIME-INR - Abnormal; Notable for the following components:   Prothrombin Time 25.1 (*)    INR 2.3 (*)    All other components within normal limits    EKG None  Radiology CT Head Wo Contrast  Result Date: 03/09/2022 CLINICAL DATA:  [place] 3 days ago, on Coumadin. Frequent falls over the past few months. EXAM: CT HEAD WITHOUT CONTRAST TECHNIQUE: Contiguous axial images were obtained from the base of the skull through the vertex without intravenous contrast. RADIATION DOSE REDUCTION: This exam was performed according to the departmental dose-optimization program which includes automated exposure  control, adjustment of the mA and/or kV according to patient size and/or use of iterative reconstruction technique. COMPARISON:  Head CT 05/04/2020. 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 There is mild cerebellar atrophy. No new asymmetry is seen concerning for an acute infarct, hemorrhage or mass. There is no midline shift. The basal cisterns are clear. Vascular: The carotid siphons and distal vertebral arteries are heavily calcified. No hyperdense central vessel  is seen. Skull: Negative for fracture or focal lesion. Sinuses/Orbits: There is chronic fracture deformity of the nasal bone which was previously acute. Old lens extractions are again shown. There is increased membrane disease and fluid in the left sphenoid air cell. The other paranasal sinuses, bilateral mastoid air cells are clear. Other: None. IMPRESSION: 1. No acute intracranial CT findings. Chronic change. Old left parietal infarct. 2. Left sphenoid sinusitis not seen previously with circumferential membrane thickening and fluid in the cavity. Query acute sinusitis. Electronically Signed   By: Telford Nab M.D.   On: 03/09/2022 02:38   DG Lumbar Spine Complete  Result Date: 03/09/2022 CLINICAL DATA:  [place] 3 days ago with continued back pain. EXAM: LUMBAR SPINE - COMPLETE 4+ VIEW COMPARISON:  Recent lumbar spine series dated 02/27/2022 FINDINGS: Osteopenia. The vertebra are normal in heights without evidence of fractures. Unchanged degenerative mild grade 1 L4-5 spondylolisthesis with otherwise normal alignment. Partial disc space loss is seen once again at L4-5 with otherwise normal disc heights and there is mild spondylosis. Facet hypertrophy is seen from L2-3 down greatest at L4-5 and L5-S1. Suspected at least mild/moderate foraminal stenosis at the lowest 2 levels. The aorta and iliac arteries are heavily  calcified. Two adjacent laminated gallstones in the right upper abdomen again are noted 1 measuring 2 cm and the other 1.7 cm. The visualized pelvis is intact. The SI joints are unremarkable, as visualized. Surgical clips left pelvic floor. IMPRESSION: Osteopenia and degenerative changes without evidence of fractures. Comparison to the prior study reveals no significant interval change. Aortic atherosclerosis. Cholelithiasis. Electronically Signed   By: Telford Nab M.D.   On: 03/09/2022 02:31   DG Ankle Complete Left  Result Date: 03/09/2022 CLINICAL DATA:  [place] 3 days ago with continued swelling and pain. EXAM: LEFT ANKLE COMPLETE - 3+ VIEW COMPARISON:  None Available. FINDINGS: There is mild osteopenia. There is acute nondisplaced oblique fracture of the distal fibular diametaphysis above the level of the ankle joint. No other fractures are seen. The mortise is symmetric with mild joint space loss along the mortise. There is joint space loss at the talonavicular joint. No reactive osteophytes are seen. There is a small noninflammatory plantar calcaneal spur. Calcifications are present in the trifurcation arteries. There is moderate swelling at the ankle. IMPRESSION: Nondisplaced oblique fracture of the distal fibular diametaphysis above the level of the ankle joint. No widening at the syndesmosis. No further fractures identified. There is osteopenia, degenerative change and swelling, with vascular calcifications. Electronically Signed   By: Telford Nab M.D.   On: 03/09/2022 02:26   DG Knee Complete 4 Views Left  Result Date: 03/09/2022 CLINICAL DATA:  Fall injury 3 days ago with continued pain in the left ankle and left knee. EXAM: LEFT KNEE - COMPLETE 4+ VIEW COMPARISON:  None Available. FINDINGS: There is osteopenia without evidence of fractures. No suprapatellar effusion is seen. There is partial medial femorotibial compartment joint space loss with trace marginal spurring. Other joint spaces are  maintained. There are heavy vascular calcifications. Surrounding soft tissues otherwise are unremarkable. IMPRESSION: Osteopenia and degenerative change without evidence of fractures. Heavy vascular calcification. Electronically Signed   By: Telford Nab M.D.   On: 03/09/2022 02:20    Procedures Procedures    Medications Ordered in ED Medications  HYDROcodone-acetaminophen (NORCO/VICODIN) 5-325 MG per tablet 1 tablet (1 tablet Oral Given 03/09/22 0341)    ED Course/ Medical Decision Making/ A&P  Medical Decision Making Amount and/or Complexity of Data Reviewed Labs: ordered. Radiology: ordered.  Risk Prescription drug management.   This patient presents to the ED for concern of ankle pain, fall, this involves an extensive number of treatment options, and is a complaint that carries with it a high risk of complications and morbidity.  I considered the following differential and admission for this acute, potentially life threatening condition.  The differential diagnosis includes fracture, sprain, intracranial injury such as subdural hematoma  MDM:    This is an 88 year old female who presents following a fall.  She states she has fallen frequently in the last several weeks.  She reports that she just "gets weak and falls."  Reports increasing pain to the left leg.  She is overall nontoxic-appearing.  Vital signs notable for blood pressure 167/64.  She has contusion and ecchymosis of the left ankle and left knee.  She is awake, alert, oriented.  However, she is at increased risk for subdural hematoma given her Coumadin use and recurrent falls.  For this reason we will obtain head CT.  Additionally will obtain x-ray imaging to rule out fracture.  Labs obtained.  INR is therapeutic.  Hemoglobin is normal.  CT head shows no evidence of subdural or subarachnoid hemorrhage.  No other traumatic injury noted.  She does have a distal fibular fracture.  I have reviewed the  images myself.  It does not involve the ankle mortise.  She will be placed in a postop boot.  She uses a walker at home.  She does not feel comfortable with crutches.  Feel it is reasonable for her to do touchdown weightbearing in the boot until she follows up with orthopedics.  She was ambulated in the hall with a walker and did well per the patient and her family.  (Labs, imaging, consults)  Labs: I Ordered, and personally interpreted labs.  The pertinent results include: CBC, BMP, PT/INR  Imaging Studies ordered: I ordered imaging studies including CT head, x-rays I independently visualized and interpreted imaging. I agree with the radiologist interpretation  Additional history obtained from family at bedside.  External records from outside source obtained and reviewed including prior evaluations  Cardiac Monitoring: The patient was maintained on a cardiac monitor.  I personally viewed and interpreted the cardiac monitored which showed an underlying rhythm of: Sinus rhythm  Reevaluation: After the interventions noted above, I reevaluated the patient and found that they have :improved  Social Determinants of Health: Frequent falls, lives independently  Disposition: Discharge  Co morbidities that complicate the patient evaluation  Past Medical History:  Diagnosis Date   Anemia    Anginal pain ([place])    Arteriovenous malformation of gastrointestinal tract    Arthritis    "fingers mostly" (04/12/2015)   Carotid artery disease ([place])    Carotid US 3/17:  99-24% RICA; 26-83% LICA; Elevated bilateral subclavian artery velocities >>f/u 1 year. // Carotid US 4/18: R 40-59; L 1-39 >> FU 1 year // Carotid US 10/2018: R 40-59; L 1-39, L subclavian stenosis    CHF (congestive heart failure) (HCC)    Chronic kidney disease (CKD), stage III (moderate) (HCC)    Chronic lower GI bleeding    "today; last time was ~ 8 yr ago; used to have them often before that too" (02/11/2013)   Coronary artery  disease    DJD (degenerative joint disease)    Dysrhythmia    Heart murmur    History of blood transfusion    "  a few times over the years; usually related to my Coumadin" (04/12/2015   History of gout    Hyperlipemia    Hypertension    Macula lutea degeneration    Old MI (myocardial infarction)    "a coulple /dr in 02/2008; I never even knew I'd had them" (04/12/2015)   Type II diabetes mellitus ([place])      Medicines Meds ordered this encounter  Medications   HYDROcodone-acetaminophen (NORCO/VICODIN) 5-325 MG per tablet 1 tablet   oxyCODONE-acetaminophen (PERCOCET/ROXICET) 5-325 MG tablet    Sig: Take 1 tablet by mouth every 6 (six) hours as needed for severe pain.    Dispense:  5 tablet    Refill:  0    I have reviewed the patients home medicines and have made adjustments as needed  Problem List / ED Course: Problem List Items Addressed This Visit   None Visit Diagnoses     Fall, initial encounter    -  Primary   Other closed fracture of distal end of left fibula, initial encounter                       Final Clinical Impression(s) / ED Diagnoses Final diagnoses:  Fall, initial encounter  Other closed fracture of distal end of left fibula, initial encounter    Rx / DC Orders ED Discharge Orders          Ordered    oxyCODONE-acetaminophen (PERCOCET/ROXICET) 5-325 MG tablet  Every 6 hours PRN        03/09/22 0440              Merryl Hacker, MD 03/09/22 [phone]

## 2022-03-09 NOTE — Progress Notes (Signed)
Orthopedic Tech Progress Note Patient Details:  [name] [birth year] [id number]  Ortho Devices Type of Ortho Device: CAM walker Ortho Device/Splint Location: lle Ortho Device/Splint Interventions: Ordered, Application, Adjustment  Pt refused crutches due to having walker at home. Post Interventions Patient Tolerated: Well Instructions Provided: Care of device, Adjustment of device  Karolee Stamps 03/09/2022, 5:25 AM

## 2022-03-13 ENCOUNTER — Other Ambulatory Visit: Payer: Self-pay | Admitting: Orthopedic Surgery

## 2022-03-13 DIAGNOSIS — S82455A Nondisplaced comminuted fracture of shaft of left fibula, initial encounter for closed fracture: Secondary | ICD-10-CM | POA: Diagnosis not present

## 2022-03-13 DIAGNOSIS — M5416 Radiculopathy, lumbar region: Secondary | ICD-10-CM

## 2022-03-13 DIAGNOSIS — M79662 Pain in left lower leg: Secondary | ICD-10-CM | POA: Diagnosis not present

## 2022-03-27 ENCOUNTER — Ambulatory Visit
Admission: RE | Admit: 2022-03-27 | Discharge: 2022-03-27 | Disposition: A | Discharge status: Home / Self Care | Payer: Medicare Other | Source: Ambulatory Visit | Attending: Orthopedic Surgery | Admitting: Orthopedic Surgery

## 2022-03-27 DIAGNOSIS — M5416 Radiculopathy, lumbar region: Secondary | ICD-10-CM

## 2022-03-27 DIAGNOSIS — M5116 Intervertebral disc disorders with radiculopathy, lumbar region: Secondary | ICD-10-CM | POA: Diagnosis not present

## 2022-03-27 DIAGNOSIS — M48061 Spinal stenosis, lumbar region without neurogenic claudication: Secondary | ICD-10-CM | POA: Diagnosis not present

## 2022-04-03 DIAGNOSIS — M5116 Intervertebral disc disorders with radiculopathy, lumbar region: Secondary | ICD-10-CM | POA: Diagnosis not present

## 2022-04-03 DIAGNOSIS — M4316 Spondylolisthesis, lumbar region: Secondary | ICD-10-CM | POA: Diagnosis not present

## 2022-04-08 ENCOUNTER — Telehealth: Payer: Self-pay | Admitting: *Deleted

## 2022-04-08 ENCOUNTER — Other Ambulatory Visit: Payer: Self-pay | Admitting: Neurosurgery

## 2022-04-08 ENCOUNTER — Other Ambulatory Visit: Payer: Self-pay | Admitting: Interventional Cardiology

## 2022-04-08 DIAGNOSIS — M5116 Intervertebral disc disorders with radiculopathy, lumbar region: Secondary | ICD-10-CM

## 2022-04-08 NOTE — Telephone Encounter (Signed)
   Pre-operative Risk Assessment    Patient Name: [name]  DOB: 07/27/1933 MRN: [id number]      Request for Surgical Clearance    Procedure:   LUMBAR EPIDURAL  Date of Surgery:  Clearance TBD                                 Surgeon:   Surgeon's Group or Practice Name:  [facility] Phone number:  [phone] Fax number:  [phone]   Type of Clearance Requested:   - Pharmacy:  Hold Warfarin (Coumadin) X'S 4 DAYS, ALSO ON REQUEST IT STATES PT WILL NEED TO HAVE INR CHECKED THE AFTERNOON BEFORE OR THE MORNING OF PROCEDURE   Type of Anesthesia:  Local    Additional requests/questions:    Astrid Divine   04/08/2022, 2:43 PM  \

## 2022-04-10 NOTE — Telephone Encounter (Addendum)
Patient with diagnosis of mechanical AVR and afib on warfarin for anticoagulation. Also with hx of recurrent GI bleeds.  Procedure: lumbar epidural Date of procedure: TBD  CHA2DS2-VASc Score = 7  This indicates a 11.2% annual risk of stroke. The patient's score is based upon: CHF History: 1 HTN History: 1 Diabetes History: 1 Stroke History: 0 Vascular Disease History: 1 Age Score: 2 Gender Score: 1   CrCl 44m/min Platelet count 200K  Per office protocol, patient can hold warfarin for 5 days prior to procedure. Typically bridge with Lovenox for CHADS2VASc score of 7 and up, however based on patient's age, CKD, and prior GI bleeds, may not be the safest option. Will forward to MD for input.   Looks like procedure is already scheduled for 7/24 with INR check beforehand.  **This guidance is not considered finalized until pre-operative APP has relayed final recommendations.**

## 2022-04-11 NOTE — Telephone Encounter (Signed)
Pt's husband is returning call. Transferred to Christen Bame, NP.

## 2022-04-11 NOTE — Telephone Encounter (Signed)
I spoke with patient's husband, Fritz Pickerel, per [facility] and reviewed instructions regarding holding Coumadin and no bridging needed. He is aware of appointment with Coumadin clinic on Monday 7/24 and will receive advisement from them regarding restarting patient's coumadin. He thanked me for the call.

## 2022-04-11 NOTE — Telephone Encounter (Signed)
Left message for patient to call back to discuss plans for holding Coumadin prior to procedure

## 2022-04-14 ENCOUNTER — Other Ambulatory Visit: Payer: Self-pay | Admitting: Neurosurgery

## 2022-04-14 ENCOUNTER — Ambulatory Visit (INDEPENDENT_AMBULATORY_CARE_PROVIDER_SITE_OTHER): Payer: Medicare Other

## 2022-04-14 ENCOUNTER — Other Ambulatory Visit: Payer: Medicare Other

## 2022-04-14 ENCOUNTER — Ambulatory Visit
Admission: RE | Admit: 2022-04-14 | Discharge: 2022-04-14 | Disposition: A | Discharge status: Home / Self Care | Payer: Medicare Other | Source: Ambulatory Visit | Attending: Neurosurgery | Admitting: Neurosurgery

## 2022-04-14 DIAGNOSIS — M5116 Intervertebral disc disorders with radiculopathy, lumbar region: Secondary | ICD-10-CM

## 2022-04-14 DIAGNOSIS — I480 Paroxysmal atrial fibrillation: Secondary | ICD-10-CM | POA: Diagnosis not present

## 2022-04-14 DIAGNOSIS — Z952 Presence of prosthetic heart valve: Secondary | ICD-10-CM

## 2022-04-14 DIAGNOSIS — M47817 Spondylosis without myelopathy or radiculopathy, lumbosacral region: Secondary | ICD-10-CM | POA: Diagnosis not present

## 2022-04-14 DIAGNOSIS — Z5181 Encounter for therapeutic drug level monitoring: Secondary | ICD-10-CM | POA: Diagnosis not present

## 2022-04-14 DIAGNOSIS — Z7901 Long term (current) use of anticoagulants: Secondary | ICD-10-CM | POA: Diagnosis not present

## 2022-04-14 LAB — POCT INR: INR: 1.3 — AB (ref 2.0–3.0)

## 2022-04-14 MED ORDER — IOPAMIDOL (ISOVUE-M 200) INJECTION 41%
1.0000 mL | Freq: Once | INTRAMUSCULAR | Status: AC
  Administered 2022-04-14: 1 mL via EPIDURAL

## 2022-04-14 MED ORDER — METHYLPREDNISOLONE ACETATE 40 MG/ML INJ SUSP (RADIOLOG
80.0000 mg | Freq: Once | INTRAMUSCULAR | Status: AC
  Administered 2022-04-14: 80 mg via EPIDURAL

## 2022-04-14 NOTE — Discharge Instructions (Signed)
Post Procedure Spinal Discharge Instruction Sheet  You may resume a regular diet and any medications that you routinely take (including pain medications) unless otherwise noted by MD.  No driving day of procedure.  Light activity throughout the rest of the day.  Do not do any strenuous work, exercise, bending or lifting.  The day following the procedure, you can resume normal physical activity but you should refrain from exercising or physical therapy for at least three days thereafter.  You may apply ice to the injection site, 20 minutes on, 20 minutes off, as needed. Do not apply ice directly to skin.    Common Side Effects:  Headaches- take your usual medications as directed by your physician.  Increase your fluid intake.  Caffeinated beverages may be helpful.  Lie flat in bed until your headache resolves.  Restlessness or inability to sleep- you may have trouble sleeping for the next few days.  Ask your referring physician if you need any medication for sleep.  Facial flushing or redness- should subside within a few days.  Increased pain- a temporary increase in pain a day or two following your procedure is not unusual.  Take your pain medication as prescribed by your referring physician.  Leg cramps  Please contact our office at [phone] for the following symptoms: Fever greater than 100 degrees. Headaches unresolved with medication after 2-3 days. Increased swelling, pain, or redness at injection site.   Thank you for visiting [facility] today.    YOU MAY RESUME YOUR COUMADIN 12 HRS AFTER PROCEDURE

## 2022-04-14 NOTE — Patient Instructions (Signed)
HELD 4 DAYS FOR PROCEDURE ESI 7/24.  TAKE 1.5 TABLETS TONIGHT ONLY THEN Continue taking 1/2 tablet daily except 1 tablet on Mondays. Recheck INR in 1 week. Call our office if you have any problems or concerns [phone].

## 2022-04-24 ENCOUNTER — Other Ambulatory Visit: Payer: Self-pay | Admitting: Student

## 2022-04-24 DIAGNOSIS — M4316 Spondylolisthesis, lumbar region: Secondary | ICD-10-CM

## 2022-04-25 ENCOUNTER — Ambulatory Visit (INDEPENDENT_AMBULATORY_CARE_PROVIDER_SITE_OTHER): Payer: Medicare Other

## 2022-04-25 DIAGNOSIS — Z5181 Encounter for therapeutic drug level monitoring: Secondary | ICD-10-CM | POA: Diagnosis not present

## 2022-04-25 DIAGNOSIS — Z952 Presence of prosthetic heart valve: Secondary | ICD-10-CM

## 2022-04-25 DIAGNOSIS — I480 Paroxysmal atrial fibrillation: Secondary | ICD-10-CM

## 2022-04-25 DIAGNOSIS — Z7901 Long term (current) use of anticoagulants: Secondary | ICD-10-CM | POA: Diagnosis not present

## 2022-04-25 LAB — POCT INR: INR: 2.3 (ref 2.0–3.0)

## 2022-04-25 NOTE — Patient Instructions (Signed)
Continue taking 1/2 tablet daily except 1 tablet on Mondays. Recheck INR in 3 weeks. Call our office if you have any problems or concerns [phone].

## 2022-04-30 DIAGNOSIS — E113293 Type 2 diabetes mellitus with mild nonproliferative diabetic retinopathy without macular edema, bilateral: Secondary | ICD-10-CM | POA: Diagnosis not present

## 2022-04-30 DIAGNOSIS — H353221 Exudative age-related macular degeneration, left eye, with active choroidal neovascularization: Secondary | ICD-10-CM | POA: Diagnosis not present

## 2022-04-30 DIAGNOSIS — H353112 Nonexudative age-related macular degeneration, right eye, intermediate dry stage: Secondary | ICD-10-CM | POA: Diagnosis not present

## 2022-04-30 DIAGNOSIS — H35371 Puckering of macula, right eye: Secondary | ICD-10-CM | POA: Diagnosis not present

## 2022-05-05 DIAGNOSIS — M25562 Pain in left knee: Secondary | ICD-10-CM | POA: Diagnosis not present

## 2022-05-07 ENCOUNTER — Other Ambulatory Visit: Payer: Self-pay | Admitting: Interventional Cardiology

## 2022-05-10 ENCOUNTER — Other Ambulatory Visit: Payer: Self-pay | Admitting: Interventional Cardiology

## 2022-05-12 ENCOUNTER — Other Ambulatory Visit: Payer: Self-pay | Admitting: Orthopedic Surgery

## 2022-05-12 ENCOUNTER — Other Ambulatory Visit: Payer: Self-pay

## 2022-05-12 DIAGNOSIS — E113299 Type 2 diabetes mellitus with mild nonproliferative diabetic retinopathy without macular edema, unspecified eye: Secondary | ICD-10-CM | POA: Insufficient documentation

## 2022-05-12 DIAGNOSIS — R809 Proteinuria, unspecified: Secondary | ICD-10-CM | POA: Insufficient documentation

## 2022-05-12 DIAGNOSIS — I509 Heart failure, unspecified: Secondary | ICD-10-CM | POA: Insufficient documentation

## 2022-05-12 DIAGNOSIS — I130 Hypertensive heart and chronic kidney disease with heart failure and stage 1 through stage 4 chronic kidney disease, or unspecified chronic kidney disease: Secondary | ICD-10-CM | POA: Insufficient documentation

## 2022-05-12 DIAGNOSIS — M109 Gout, unspecified: Secondary | ICD-10-CM | POA: Insufficient documentation

## 2022-05-12 DIAGNOSIS — H353290 Exudative age-related macular degeneration, unspecified eye, stage unspecified: Secondary | ICD-10-CM | POA: Insufficient documentation

## 2022-05-12 DIAGNOSIS — E1139 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: Secondary | ICD-10-CM | POA: Insufficient documentation

## 2022-05-12 DIAGNOSIS — Z952 Presence of prosthetic heart valve: Secondary | ICD-10-CM | POA: Insufficient documentation

## 2022-05-12 DIAGNOSIS — Q2733 Arteriovenous malformation of digestive system vessel: Secondary | ICD-10-CM | POA: Insufficient documentation

## 2022-05-12 DIAGNOSIS — S83207A Unspecified tear of unspecified meniscus, current injury, left knee, initial encounter: Secondary | ICD-10-CM

## 2022-05-12 DIAGNOSIS — E1121 Type 2 diabetes mellitus with diabetic nephropathy: Secondary | ICD-10-CM | POA: Insufficient documentation

## 2022-05-14 ENCOUNTER — Ambulatory Visit
Admission: RE | Admit: 2022-05-14 | Discharge: 2022-05-14 | Disposition: A | Discharge status: Home / Self Care | Payer: Medicare Other | Source: Ambulatory Visit | Attending: Orthopedic Surgery | Admitting: Orthopedic Surgery

## 2022-05-14 DIAGNOSIS — S83207A Unspecified tear of unspecified meniscus, current injury, left knee, initial encounter: Secondary | ICD-10-CM

## 2022-05-14 DIAGNOSIS — M25562 Pain in left knee: Secondary | ICD-10-CM | POA: Diagnosis not present

## 2022-05-16 ENCOUNTER — Ambulatory Visit (INDEPENDENT_AMBULATORY_CARE_PROVIDER_SITE_OTHER): Payer: Medicare Other

## 2022-05-16 DIAGNOSIS — Z7901 Long term (current) use of anticoagulants: Secondary | ICD-10-CM | POA: Diagnosis not present

## 2022-05-16 DIAGNOSIS — Z952 Presence of prosthetic heart valve: Secondary | ICD-10-CM | POA: Diagnosis not present

## 2022-05-16 DIAGNOSIS — Z5181 Encounter for therapeutic drug level monitoring: Secondary | ICD-10-CM

## 2022-05-16 DIAGNOSIS — I480 Paroxysmal atrial fibrillation: Secondary | ICD-10-CM | POA: Diagnosis not present

## 2022-05-16 LAB — POCT INR: INR: 2.4 (ref 2.0–3.0)

## 2022-05-16 NOTE — Patient Instructions (Signed)
Continue taking 1/2 tablet daily except 1 tablet on Mondays. Recheck INR in 3 weeks. Call our office if you have any problems or concerns [phone].

## 2022-05-21 ENCOUNTER — Other Ambulatory Visit: Payer: Medicare Other

## 2022-05-22 ENCOUNTER — Telehealth: Payer: Self-pay | Admitting: *Deleted

## 2022-05-22 NOTE — Telephone Encounter (Signed)
   Pre-operative Risk Assessment    Patient Name: [name]  DOB: 09/09/1933 MRN: [id number]      Request for Surgical Clearance    Procedure:   LUMBAR MICRODISCECTOMY  Date of Surgery:  Clearance TBD                                 Surgeon:  DR. Newman Pies Surgeon's Group or Practice Name:  [facility] Phone number:  [phone] Fax number:  [phone]   Type of Clearance Requested:   - Medical    Type of Anesthesia:  General    Additional requests/questions:    Astrid Divine   05/22/2022, 7:19 AM

## 2022-05-23 NOTE — Telephone Encounter (Signed)
   Name: [name]  DOB: [birth year]  MRN: [id number]  Primary Cardiologist: Larae Grooms, MD  Chart reviewed as part of pre-operative protocol coverage. Because of [name]'s past medical history and time since last visit, she will require a follow-up in-office visit in order to better assess preoperative cardiovascular risk.  Pre-op covering staff: - Please schedule appointment and call patient to inform them. If patient already had an upcoming appointment within acceptable timeframe, please add "pre-op clearance" to the appointment notes so provider is aware. - Please contact requesting surgeon's office via preferred method (i.e, phone, fax) to inform them of need for appointment prior to surgery.  This message will also be routed to pharmacy pool for input on holding coumadin as requested below so that this information is available to the clearing provider at time of patient's appointment.   [place], [place]  05/23/2022, 11:02 PM

## 2022-05-23 NOTE — Telephone Encounter (Signed)
Clinical pharmacist to review Coumadin.  Although Coumadin is not listed on her medication list, it appears she is going to Coumadin clinic every 3 weeks.  During the previous pharmacy clearance in July prior to neck injection, Dr. Irish Lack mentioned " I would not bridge with Lovenox.  She is very sensitive to small doses of warfarin and in the past, has returned to therapeutic range INR within 2-3 doses.  Has had severe  bleeding issue many years ago when using Lovenox so would just hold COumadin without a bridge."

## 2022-05-27 ENCOUNTER — Encounter: Payer: Self-pay | Admitting: Interventional Cardiology

## 2022-05-27 ENCOUNTER — Other Ambulatory Visit: Payer: Self-pay | Admitting: Neurosurgery

## 2022-05-27 NOTE — Telephone Encounter (Signed)
Husband called to follow-up on pre-op clearance stating that patient will lose surgical date 9/11 if they do not received the surgical clearance.  Husband stated her blood thinner will need to be held.  Husband stated Dr. Arnoldo Morale' office will need to be contacted today at ph# [phone].

## 2022-05-27 NOTE — Telephone Encounter (Signed)
Patient with diagnosis of mechanical AVR and afib  on warfarin for anticoagulation.    Procedure: LUMBAR MICRODISCECTOMY Date of procedure: 06/02/22   CHA2DS2-VASc Score = 7   This indicates a 11.2% annual risk of stroke. The patient's score is based upon: CHF History: 1 HTN History: 1 Diabetes History: 1 Stroke History: 0 Vascular Disease History: 1 Age Score: 2 Gender Score: 1      CrCl 19 ml/min  Per office protocol, patient can hold warfarin for 5 days prior to procedure.    Due to pt CKD Lovenox not a good choice. Per last clearance 7/18, Dr. Irish Lack wrote  She is very sensitive to small doses of warfarin and in the past, has returned to therapeutic range INR within 2-3 doses.  Has had severe  bleeding issue many years ago when using Lovenox so would just hold COumadin without a bridge.   Therefore patient may hold warfarin for 5 days without a bridge.  **This guidance is not considered finalized until pre-operative APP has relayed final recommendations.**

## 2022-05-27 NOTE — Telephone Encounter (Signed)
Left message to call back ASAP tomorrow morning so that we may schedule the pt for tele visit tomorrow pm as urgent add on.

## 2022-05-27 NOTE — Telephone Encounter (Signed)
Please add on for virtual visit for afternoon of 05/28/22.   Hull, MD   Preoperative team, please contact this patient and set up a phone call appointment for further preoperative risk assessment. Please obtain consent and complete medication review. Thank you for your help.   I confirm that guidance regarding antiplatelet and oral anticoagulation therapy has been completed and, if necessary, noted below.   Emmaline Life, NP-C    05/27/2022, 5:28 PM [address] Office [phone] Fax [phone]

## 2022-05-28 ENCOUNTER — Telehealth: Payer: Self-pay | Admitting: *Deleted

## 2022-05-28 ENCOUNTER — Ambulatory Visit: Payer: Medicare Other | Attending: Physician Assistant | Admitting: Physician Assistant

## 2022-05-28 DIAGNOSIS — Z01810 Encounter for preprocedural cardiovascular examination: Secondary | ICD-10-CM | POA: Diagnosis not present

## 2022-05-28 NOTE — Progress Notes (Signed)
Virtual Visit via Telephone Note   Because of [name]'s co-morbid illnesses, she is at least at moderate risk for complications without adequate follow up.  This format is felt to be most appropriate for this patient at this time.  The patient did not have access to video technology/had technical difficulties with video requiring transitioning to audio format only (telephone).  All issues noted in this document were discussed and addressed.  No physical exam could be performed with this format.  Please refer to the patient's chart for her consent to telehealth for [facility].  Evaluation Performed:  Preoperative cardiovascular risk assessment _____________   Date:  05/28/2022   Patient ID:  [name], DOB 11-02-1932, MRN [id number] Patient Location:  Home Provider location:   Office  Primary Care Provider:  Gaynelle Arabian, MD Primary Cardiologist:  Larae Grooms, MD  Chief Complaint / Patient Profile   88 y.o. y/o female with a h/o CAD and aortic valve disease status post CABG plus mechanical AVR done in [place] in the late 1990s, subsequent PCI to the RCA with BMS, HTN, HL, diabetes, prior GI bleed, CKD, diastolic CHF, and PAF who is pending lumbar microdiscectomy and presents today for telephonic preoperative cardiovascular risk assessment.  Past Medical History    Past Medical History:  Diagnosis Date   Anemia    Anginal pain ([place])    Arteriovenous malformation of gastrointestinal tract    Arthritis    "fingers mostly" (04/12/2015)   Carotid artery disease ([place])    Carotid US 3/17:  64-33% RICA; 29-51% LICA; Elevated bilateral subclavian artery velocities >>f/u 1 year. // Carotid US 4/18: R 40-59; L 1-39 >> FU 1 year // Carotid US 10/2018: R 40-59; L 1-39, L subclavian stenosis    CHF (congestive heart failure) (HCC)    Chronic kidney disease (CKD), stage III (moderate) (HCC)    Chronic lower GI bleeding    "today; last time was ~ 8 yr ago; used to  have them often before that too" (02/11/2013)   Coronary artery disease    DJD (degenerative joint disease)    Dysrhythmia    Heart murmur    History of blood transfusion    "a few times over the years; usually related to my Coumadin" (04/12/2015   History of gout    Hyperlipemia    Hypertension    Macula lutea degeneration    Old MI (myocardial infarction)    "a coulple /dr in 02/2008; I never even knew I'd had them" (04/12/2015)   Type II diabetes mellitus [facility])    Past Surgical History:  Procedure Laterality Date   [place]   BIOPSY  11/06/2019   Procedure: BIOPSY;  Surgeon: Doran Stabler, MD;  Location: [place];  Service: Gastroenterology;;   CARDIAC CATHETERIZATION  ~1990   CARDIAC CATHETERIZATION  04/12/2015   Procedure: Coronary Stent Intervention;  Surgeon: Jettie Booze, MD;    SYNERGY DES 3.5X16 to the ostial RCA    CARDIAC CATHETERIZATION  04/12/2015   Procedure: Coronary/Graft Angiography;  Surgeon: Eloy End, MD; LAD & CFX 100%, patent LIMA-LAD, SVG-D1; SVG-OM-PDA first limb 100%, 2nd limb patent; oRCA 75%>0 w/ stent   [place]. Jude/notes 10/29/2003 (02/11/2013)   CARDIOVERSION N/A 08/15/2019   Procedure: CARDIOVERSION;  Surgeon: Thayer Headings, MD;  Location: [place];  Service: Cardiovascular;  Laterality: N/A;   COLONOSCOPY N/A 02/13/2013  Procedure: COLONOSCOPY;  Surgeon: Lafayette Dragon, MD;  Location: [facility] ENDOSCOPY;  Service: Endoscopy;  Laterality: N/A;   CORONARY ANGIOPLASTY WITH STENT PLACEMENT  2009+   "3 at least; put in 1 stent at a time" (02/11/2013)   CORONARY ARTERY BYPASS GRAFT  1990   LIMA-LAD, SVG-OM-PDA, SVG-D1   DILATION AND CURETTAGE OF UTERUS  1960's   'after a miscarriage" (02/11/2013)   ENTEROSCOPY N/A 02/13/2013   Procedure: ENTEROSCOPY;  Surgeon: Lafayette Dragon, MD;  Location: [facility] ENDOSCOPY;  Service: Endoscopy;  Laterality: N/A;   ENTEROSCOPY N/A  11/06/2019   Procedure: ENTEROSCOPY;  Surgeon: Doran Stabler, MD;  Location: [facility] ENDOSCOPY;  Service: Gastroenterology;  Laterality: N/A;   ESOPHAGOGASTRODUODENOSCOPY (EGD) WITH PROPOFOL N/A 12/01/2019   Procedure: ESOPHAGOGASTRODUODENOSCOPY (EGD) WITH PROPOFOL;  Surgeon: Mauri Pole, MD;  Location: [facility] ENDOSCOPY;  Service: Endoscopy;  Laterality: N/A;   EXPLORATORY LAPAROTOMY  02/24/2008   which revealed a retroperitoneal hematoma and bleeding from the right external iliac artery/notes 03/02/2008 (02/11/2013)    FLEXIBLE SIGMOIDOSCOPY N/A 04/21/2015   Procedure: FLEXIBLE SIGMOIDOSCOPY;  Surgeon: Ladene Artist, MD;  Location: [facility] ENDOSCOPY;  Service: Endoscopy;  Laterality: N/A;   HOT HEMOSTASIS N/A 11/06/2019   Procedure: HOT HEMOSTASIS (ARGON PLASMA COAGULATION/BICAP);  Surgeon: Doran Stabler, MD;  Location: [place];  Service: Gastroenterology;  Laterality: N/A;   TEE WITHOUT CARDIOVERSION N/A 12/01/2018   Procedure: TRANSESOPHAGEAL ECHOCARDIOGRAM (TEE);  Surgeon: Jerline Pain, MD;  Location: [facility] ENDOSCOPY;  Service: Cardiovascular;  Laterality: N/A;    Allergies  Allergies  Allergen Reactions   Darvon [Propoxyphene] Nausea And Vomiting   Lisinopril Cough   Septra [Sulfamethoxazole-Trimethoprim] Other (See Comments)    Increased INR   Warfarin And Related Other (See Comments)    ONLY TOLERATES BRAND   Trazodone And Nefazodone     Severe sweating   Zolpidem Tartrate     Hallucinations   Penicillins Rash    Did it involve swelling of the face/tongue/throat, SOB, or low BP? No Did it involve sudden or severe rash/hives, skin peeling, or any reaction on the inside of your mouth or nose? No Did you need to seek medical attention at a hospital or doctor's office? No When did it last happen?  15 years     If all above answers are "NO", may proceed with cephalosporin use.    History of Present Illness    [name] is a 88 y.o. female who presents via audio/video  conferencing for a telehealth visit today.  Pt was last seen in cardiology clinic on 12/06/2021 by Dr. Irish Lack.  At that time PEMA THOMURE was doing well .  BP was elevated at that appointment.  The patient is now pending procedure as outlined above. Since her last visit, she has been doing well.  She is able to walk indoors and 1-2 blocks on level ground with the assistance of her walker.  She can go up and down a few stairs going into her home but they do not have a set of stairs inside the home.  She is able to do some light to moderate housework but no heavy lifting.  Her husband takes care of the yard work.  Because of that she is scored a 5.07 METS on the DASI which exceeds the 4 METS minimum requirement.  She has not had any cardiac symptoms and knows to call our office if the symptoms occur prior to her scheduled surgery.   Per office  protocol she can hold Coumadin x5 days prior to the procedure.  Due to patient having CKD Lovenox was not a good choice.  Per Dr.Varanasi sees last clearance note 7/18 "she is very sensitive to small doses of warfarin and in the past has returned to therapeutic range within 2-3 doses.  Has had severe bleeding issues many years ago when using Lovenox so would just hold Coumadin without a bridge."   Home Medications    Prior to Admission medications   Medication Sig Start Date End Date Taking? Authorizing Provider  amiodarone (PACERONE) 200 MG tablet Take 1 tablet by mouth once daily 03/10/22   Jettie Booze, MD  atorvastatin (LIPITOR) 80 MG tablet Take 1 tablet by mouth once daily 05/12/22   Jettie Booze, MD  bevacizumab (AVASTIN) 1.25 mg/0.1 mL SOLN Place 1.25 mg into the left eye every 8 (eight) weeks.    [provider]  ezetimibe (ZETIA) 10 MG tablet Take 1 tablet by mouth once daily 02/03/22   Jettie Booze, MD  FeFum-FePo-FA-B Cmp-C-Zn-Mn-Cu (SE-TAN PLUS) 162-115.2-1 MG CAPS Take 1 capsule by mouth 2 (two) times daily.  05/22/15    [provider]  folic acid (FOLVITE) 250 MCG tablet Take 400 mcg by mouth every evening.     [provider]  furosemide (LASIX) 40 MG tablet Take 40 mg by mouth daily. 11/19/19   [provider]  isosorbide mononitrate (IMDUR) 60 MG 24 hr tablet Take 1 tablet by mouth once daily 05/07/22   Jettie Booze, MD  JANTOVEN 5 MG tablet TAKE 1/2 TO 1 (ONE-HALF TO ONE) TABLET BY MOUTH ONCE DAILY AS DIRECTED BY  COUMADIN  CLINIC Patient taking differently: Take 2.5-5 mg by mouth See admin instructions. Take 5 mg by mouth on Monday and take 2.5 mg on all remaining days of the week. 01/13/22   Jettie Booze, MD  losartan (COZAAR) 50 MG tablet Take 50 mg by mouth daily.    [provider]  Multiple Vitamin (MULTIVITAMIN WITH MINERALS) TABS Take 1 tablet by mouth daily.    [provider]  Multiple Vitamins-Minerals (PRESERVISION AREDS 2 PO) Take 1 tablet by mouth 2 (two) times daily.     [provider]  nitroGLYCERIN (NITROSTAT) 0.4 MG SL tablet DISSOLVE ONE TABLET UNDER THE TONGUE EVERY 5 MINUTES AS NEEDED FOR CHEST PAIN.  DO NOT EXCEED A TOTAL OF 3 DOSES IN 15 MINUTES 02/14/20   Jettie Booze, MD  Omega-3 Fatty Acids (FISH OIL PO) Take 2 capsules by mouth in the morning and at bedtime.     [provider]  oxyCODONE (OXY IR/ROXICODONE) 5 MG immediate release tablet Take 5 mg by mouth at bedtime. 05/07/22   [provider]    Physical Exam    Vital Signs:  SURA CANUL does not have vital signs available for review today.  BP has been "good" per husband  Given telephonic nature of communication, physical exam is limited. AAOx3. NAD. Normal affect.  Speech and respirations are unlabored.  Accessory Clinical Findings    None  Assessment & Plan    1.  Preoperative Cardiovascular Risk Assessment:  Ms. Raburn perioperative risk of a major cardiac event is 11% according to the Revised Cardiac Risk Index  (RCRI).  Therefore, she is at high risk for perioperative complications.   Her functional capacity is good at 5.07 METs according to the [place] Activity Status Index (DASI). Recommendations: According to ACC/AHA guidelines, no further cardiovascular  testing needed.  The patient may proceed to surgery at acceptable risk.   Antiplatelet and/or Anticoagulation Recommendations: Coumadin can by held for 5 days prior to surgery.  Please resume post op when felt to be safe.   A copy of this note will be routed to requesting surgeon.  Time:   Today, I have spent 10 minutes with the patient with telehealth technology discussing medical history, symptoms, and management plan.     Elgie Collard, PA-C  05/28/2022, 11:34 AM

## 2022-05-28 NOTE — Telephone Encounter (Signed)
I s/w the pt's husband, DPR on file. Pt's husband agreeable to plan of care for tele pre op appt for the pt today at 1:40. See notes from yesterday. Med rec not done as the pt was just waking up, will need to go over meds during tele visit. Consent has been given though.

## 2022-05-28 NOTE — Telephone Encounter (Signed)
I s/w the pt's husband, DPR on file. Pt's husband agreeable to plan of care for tele pre op appt for the pt today at 1:40. See notes from yesterday. Med rec not done as the pt was just waking up, will need to go over meds during tele visit. Consent has been given though.     Patient Consent for Virtual Visit        [name] has provided verbal consent on 05/28/2022 for a virtual visit (video or telephone).   CONSENT FOR VIRTUAL VISIT FOR:  [name]  By participating in this virtual visit I agree to the following:  I hereby voluntarily request, consent and authorize [facility] and its employed or contracted physicians, physician assistants, nurse practitioners or other licensed health care professionals (the Practitioner), to provide me with telemedicine health care services (the "Services") as deemed necessary by the treating Practitioner. I acknowledge and consent to receive the Services by the Practitioner via telemedicine. I understand that the telemedicine visit will involve communicating with the Practitioner through live audiovisual communication technology and the disclosure of certain medical information by electronic transmission. I acknowledge that I have been given the opportunity to request an in-person assessment or other available alternative prior to the telemedicine visit and am voluntarily participating in the telemedicine visit.  I understand that I have the right to withhold or withdraw my consent to the use of telemedicine in the course of my care at any time, without affecting my right to future care or treatment, and that the Practitioner or I may terminate the telemedicine visit at any time. I understand that I have the right to inspect all information obtained and/or recorded in the course of the telemedicine visit and may receive copies of available information for a reasonable fee.  I understand that some of the potential risks of receiving the Services  via telemedicine include:  Delay or interruption in medical evaluation due to technological equipment failure or disruption; Information transmitted may not be sufficient (e.g. poor resolution of images) to allow for appropriate medical decision making by the Practitioner; and/or  In rare instances, security protocols could fail, causing a breach of personal health information.  Furthermore, I acknowledge that it is my responsibility to provide information about my medical history, conditions and care that is complete and accurate to the best of my ability. I acknowledge that Practitioner's advice, recommendations, and/or decision may be based on factors not within their control, such as incomplete or inaccurate data provided by me or distortions of diagnostic images or specimens that may result from electronic transmissions. I understand that the practice of medicine is not an exact science and that Practitioner makes no warranties or guarantees regarding treatment outcomes. I acknowledge that a copy of this consent can be made available to me via my patient portal ([facility]), or I can request a printed copy by calling the office of [facility].    I understand that my insurance will be billed for this visit.   I have read or had this consent read to me. I understand the contents of this consent, which adequately explains the benefits and risks of the Services being provided via telemedicine.  I have been provided ample opportunity to ask questions regarding this consent and the Services and have had my questions answered to my satisfaction. I give my informed consent for the services to be provided through the use of telemedicine in my medical care

## 2022-05-28 NOTE — Telephone Encounter (Signed)
Hard copy of clearance notes faxed to requesting office today per the pre lop provider.

## 2022-05-29 NOTE — Pre-Procedure Instructions (Signed)
Surgical Instructions    Your procedure is scheduled on Monday, September 11th.  Report to [facility] Main Entrance "A" at 12:30 P.M., then check in with the Admitting office.  Call this number if you have problems the morning of surgery:  [phone]   If you have any questions prior to your surgery date call [phone]: Open Monday-Friday 8am-4pm    Remember:  Do not eat or drink after midnight the night before your surgery     Take these medicines the morning of surgery with A SIP OF WATER  amiodarone (PACERONE)  atorvastatin (LIPITOR) ezetimibe (ZETIA) isosorbide mononitrate (IMDUR)   If needed: nitroGLYCERIN (NITROSTAT)  Hold JANTOVEN 5 days prior to surgery.  As of today, STOP taking any Aspirin (unless otherwise instructed by your surgeon) Aleve, Naproxen, Ibuprofen, Motrin, Advil, Goody's, BC's, all herbal medications, fish oil, and all vitamins.                     Do NOT Smoke (Tobacco/Vaping) for 24 hours prior to your procedure.  If you use a CPAP at night, you may bring your mask/headgear for your overnight stay.   Contacts, glasses, piercing's, hearing aid's, dentures or partials may not be worn into surgery, please bring cases for these belongings.    For patients admitted to the hospital, discharge time will be determined by your treatment team.   Patients discharged the day of surgery will not be allowed to drive home, and someone needs to stay with them for 24 hours.  SURGICAL WAITING ROOM VISITATION Patients having surgery or a procedure may have no more than 2 support people in the waiting area - these visitors may rotate.   Children under the age of 24 must have an adult with them who is not the patient. If the patient needs to stay at the hospital during part of their recovery, the visitor guidelines for inpatient rooms apply. Pre-op nurse will coordinate an appropriate time for 1 support person to accompany patient in pre-op.  This support person  may not rotate.   Please refer to the [facility] website for the visitor guidelines for Inpatients (after your surgery is over and you are in a regular room).    Special instructions:   [facility]- Preparing For Surgery  Before surgery, you can play an important role. Because skin is not sterile, your skin needs to be as free of germs as possible. You can reduce the number of germs on your skin by washing with CHG (chlorahexidine gluconate) Soap before surgery.  CHG is an antiseptic cleaner which kills germs and bonds with the skin to continue killing germs even after washing.    Oral Hygiene is also important to reduce your risk of infection.  Remember - BRUSH YOUR TEETH THE MORNING OF SURGERY WITH YOUR REGULAR TOOTHPASTE  Please do not use if you have an allergy to CHG or antibacterial soaps. If your skin becomes reddened/irritated stop using the CHG.  Do not shave (including legs and underarms) for at least 48 hours prior to first CHG shower. It is OK to shave your face.  Please follow these instructions carefully.   Shower the NIGHT BEFORE SURGERY and the MORNING OF SURGERY  If you chose to wash your hair, wash your hair first as usual with your normal shampoo.  After you shampoo, rinse your hair and body thoroughly to remove the shampoo.  Use CHG Soap as you would any other liquid soap. You can apply CHG directly  to the skin and wash gently with a scrungie or a clean washcloth.   Apply the CHG Soap to your body ONLY FROM THE NECK DOWN.  Do not use on open wounds or open sores. Avoid contact with your eyes, ears, mouth and genitals (private parts). Wash Face and genitals (private parts)  with your normal soap.   Wash thoroughly, paying special attention to the area where your surgery will be performed.  Thoroughly rinse your body with warm water from the neck down.  DO NOT shower/wash with your normal soap after using and rinsing off the CHG Soap.  Pat yourself dry with a CLEAN  TOWEL.  Wear CLEAN PAJAMAS to bed the night before surgery  Place CLEAN SHEETS on your bed the night before your surgery  DO NOT SLEEP WITH PETS.   Day of Surgery: Take a shower with CHG soap. Do not wear jewelry or makeup Do not wear lotions, powders, perfumes, or deodorant. Do not shave 48 hours prior to surgery.   Do not bring valuables to the hospital. [facility] is not responsible for any belongings or valuables. Do not wear nail polish, gel polish, artificial nails, or any other type of covering on natural nails (fingers and toes) If you have artificial nails or gel coating that need to be removed by a nail salon, please have this removed prior to surgery. Artificial nails or gel coating may interfere with anesthesia's ability to adequately monitor your vital signs. Wear Clean/Comfortable clothing the morning of surgery Remember to brush your teeth WITH YOUR REGULAR TOOTHPASTE.   Please read over the following fact sheets that you were given.    If you received a COVID test during your pre-op visit  it is requested that you wear a mask when out in public, stay away from anyone that may not be feeling well and notify your surgeon if you develop symptoms. If you have been in contact with anyone that has tested positive in the last 10 days please notify you surgeon.

## 2022-05-30 ENCOUNTER — Encounter (HOSPITAL_COMMUNITY)
Admission: RE | Admit: 2022-05-30 | Discharge: 2022-05-30 | Disposition: A | Discharge status: Home / Self Care | Payer: Medicare Other | Source: Ambulatory Visit | Attending: Neurosurgery | Admitting: Neurosurgery

## 2022-05-30 ENCOUNTER — Encounter (HOSPITAL_COMMUNITY): Payer: Self-pay

## 2022-05-30 ENCOUNTER — Other Ambulatory Visit: Payer: Self-pay

## 2022-05-30 VITALS — BP 185/70 | HR 78 | Temp 98.5°F | Resp 17 | Ht 60.0 in | Wt 151.9 lb

## 2022-05-30 DIAGNOSIS — Z7901 Long term (current) use of anticoagulants: Secondary | ICD-10-CM | POA: Diagnosis not present

## 2022-05-30 DIAGNOSIS — Z951 Presence of aortocoronary bypass graft: Secondary | ICD-10-CM | POA: Diagnosis not present

## 2022-05-30 DIAGNOSIS — I252 Old myocardial infarction: Secondary | ICD-10-CM | POA: Insufficient documentation

## 2022-05-30 DIAGNOSIS — E1122 Type 2 diabetes mellitus with diabetic chronic kidney disease: Secondary | ICD-10-CM | POA: Diagnosis not present

## 2022-05-30 DIAGNOSIS — Z01812 Encounter for preprocedural laboratory examination: Secondary | ICD-10-CM | POA: Insufficient documentation

## 2022-05-30 DIAGNOSIS — E1139 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: Secondary | ICD-10-CM | POA: Insufficient documentation

## 2022-05-30 DIAGNOSIS — M5116 Intervertebral disc disorders with radiculopathy, lumbar region: Secondary | ICD-10-CM | POA: Insufficient documentation

## 2022-05-30 DIAGNOSIS — Z9229 Personal history of other drug therapy: Secondary | ICD-10-CM

## 2022-05-30 DIAGNOSIS — I130 Hypertensive heart and chronic kidney disease with heart failure and stage 1 through stage 4 chronic kidney disease, or unspecified chronic kidney disease: Secondary | ICD-10-CM | POA: Diagnosis not present

## 2022-05-30 DIAGNOSIS — E785 Hyperlipidemia, unspecified: Secondary | ICD-10-CM | POA: Diagnosis not present

## 2022-05-30 DIAGNOSIS — I2510 Atherosclerotic heart disease of native coronary artery without angina pectoris: Secondary | ICD-10-CM | POA: Diagnosis not present

## 2022-05-30 DIAGNOSIS — N183 Chronic kidney disease, stage 3 unspecified: Secondary | ICD-10-CM | POA: Insufficient documentation

## 2022-05-30 DIAGNOSIS — I509 Heart failure, unspecified: Secondary | ICD-10-CM | POA: Diagnosis not present

## 2022-05-30 DIAGNOSIS — Z01818 Encounter for other preprocedural examination: Secondary | ICD-10-CM

## 2022-05-30 LAB — BASIC METABOLIC PANEL
Anion gap: 8 (ref 5–15)
BUN: 48 mg/dL — ABNORMAL HIGH (ref 8–23)
CO2: 24 mmol/L (ref 22–32)
Calcium: 9.1 mg/dL (ref 8.9–10.3)
Chloride: 106 mmol/L (ref 98–111)
Creatinine, Ser: 1.63 mg/dL — ABNORMAL HIGH (ref 0.44–1.00)
GFR, Estimated: 31 mL/min — ABNORMAL LOW (ref >60)
Glucose, Bld: 112 mg/dL — ABNORMAL HIGH (ref 70–99)
Potassium: 4.3 mmol/L (ref 3.5–5.1)
Sodium: 138 mmol/L (ref 135–145)

## 2022-05-30 LAB — CBC
HCT: 34.8 % — ABNORMAL LOW (ref 36.0–46.0)
Hemoglobin: 11.3 g/dL — ABNORMAL LOW (ref 12.0–15.0)
MCH: 31.1 pg (ref 26.0–34.0)
MCHC: 32.5 g/dL (ref 30.0–36.0)
MCV: 95.9 fL (ref 80.0–100.0)
Platelets: 202 10*3/uL (ref 150–400)
RBC: 3.63 MIL/uL — ABNORMAL LOW (ref 3.87–5.11)
RDW: 14.1 % (ref 11.5–15.5)
WBC: 7.8 10*3/uL (ref 4.0–10.5)
nRBC: 0 % (ref 0.0–0.2)

## 2022-05-30 LAB — SURGICAL PCR SCREEN
MRSA, PCR: NEGATIVE
Staphylococcus aureus: NEGATIVE

## 2022-05-30 LAB — GLUCOSE, CAPILLARY: Glucose-Capillary: 107 mg/dL — ABNORMAL HIGH (ref 70–99)

## 2022-05-30 LAB — HEMOGLOBIN A1C
Hgb A1c MFr Bld: 6.1 % — ABNORMAL HIGH (ref 4.8–5.6)
Mean Plasma Glucose: 128.37 mg/dL

## 2022-05-30 NOTE — Progress Notes (Signed)
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 She wrote, "Preoperative Cardiovascular Risk Assessment:   Ms. Trillo perioperative risk of a major cardiac event is 11% according to the Revised Cardiac Risk Index (RCRI).  Therefore, she is at high risk for perioperative complications.   Her functional capacity is good at 5.07 METs according to the Duke Activity Status Index (DASI). Recommendations: According to ACC/AHA guidelines, no further cardiovascular testing needed.  The patient may proceed to surgery at acceptable risk.   Antiplatelet and/or Anticoagulation Recommendations: Coumadin can by held for 5 days prior to surgery.  Please resume post op when felt to be safe."  She gets Avastin injections injection in her left eye about every 13-14 weeks (per patient's spouse) by Dr. Sherlynn Stalls, last 04/30/22.   Holding [place] (brand only) for 5 days prior to surgery. She will need PT/PTT on arrival.  Anesthesia team to evaluate on the day of surgery.    VS: BP (!) 185/70   Pulse 78   Temp 36.9 C   Resp 17   Ht 5' (1.524 m)   Wt 68.9 kg   SpO2 99%   BMI 29.67 kg/m   .last  PROVIDERS: Gaynelle Arabian, MD is PCP  Larae Grooms, MD is cardiologist   LABS: Labs reviewed: Acceptable for surgery. Cr 1.62, previously 1.75 on 03/09/22. Known CKD. For PT/PTT on the day of surgery.  (all labs ordered are listed, but only abnormal results are displayed)  Labs Reviewed  GLUCOSE, CAPILLARY - Abnormal; Notable for the following components:      Result Value   Glucose-Capillary 107 (*)    All other components within normal limits  HEMOGLOBIN A1C - Abnormal; Notable for the following components:   Hgb A1c MFr Bld 6.1 (*)    All other components within normal limits  BASIC METABOLIC PANEL - Abnormal; Notable for the following components:   Glucose, Bld 112 (*)    BUN 48 (*)    Creatinine, Ser 1.63 (*)    GFR, Estimated 31 (*)    All other components within normal limits  CBC - Abnormal; Notable for the following components:   RBC 3.63 (*)    Hemoglobin 11.3 (*)    HCT 34.8 (*)    All other components within normal limits  SURGICAL PCR SCREEN    PFTs 06/29/18: FVC 1.57 (76%), post 1.73 (84%). FEV1 1.13 (75%), 1.24 (83%). DLCO unc 14.60 (76%).   IMAGES: MRI L-spine 03/27/22: IMPRESSION: 1. Generalized lumbar spine degeneration most advanced at L4-5 where there is anterolisthesis and active left facet arthritis. 2. L4-5 extrusion with upward migrating left paracentral component causing notable impingement on the descending left L4 and L5 nerve roots. Moderate spinal stenosis  at this level. 3. Spinal stenosis that is moderate at L3-4 and mild at L2-3. 4. 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 5. Cholelithiasis.    EKG: 12/06/21 ([facility]): NSR, prolonged PR, LBBB, PVCs   CV: US Carotid 05/13/21: Summary:  Right Carotid: Velocities in the right ICA are consistent with a 40-59%                 stenosis.  Left Carotid: Velocities in the left ICA are consistent with a 1-39%  stenosis.  Vertebrals:  Bilateral vertebral arteries demonstrate  antegrade flow.  Subclavians: Left subclavian artery was stenotic. Normal flow hemodynamics  were               seen in the right subclavian artery.    TEE 12/01/18: IMPRESSIONS   1. The left ventricle has hyperdynamic systolic function, with an  ejection fraction of >65%. The cavity size was normal.   2. The right ventricle has normal systolc function. The cavity was  normal. There is no increase in right ventricular wall thickness.   3. Mild mitral valve prolapse.   4. Mitral valve regurgitation is mild to moderate by color flow Doppler.   5. Tricuspid valve regurgitation is moderate.   6. Aortic valve regurgitation mild perivalvular leak.   7. Pulmonic valve regurgitation was not assessed by color flow Doppler.   8. There is evidence of plaque in the descending aorta.   9. No vegetations, no endocarditis.  10. - TAVR: TAVR valve with mild perivalvular leak. No vegetations.    Cardiac cath 04/12/15: Severe native three-vessel coronary artery disease. Patent LIMA to LAD. Patent SVG to diagonal. Patent native RCA which feeds collaterals to the circumflex system. SVG to OM/PDA jump graft occluded proximally. The second half of this graft is open. Therefore, flow is going from the native RCA, and then retrograde into the vein graft which feeds the obtuse marginal. Patent stents in the RCA and posterior lateral artery. Culprit lesion for her symptoms was the new lesion in the ostium of the RCA. This was successfully treated with a 3.5 x 16 Synergy drug-eluting stent, postdilated to greater than 4 mm in diameter. Ost RCA lesion, 75% stenosed. There is a 0% residual stenosis post intervention. The lesion was not previously treated. A drug-eluting stent was placed.   Marland KitchenMarland KitchenMarland KitchenShe has had bare-metal stents in the past due to her history of GI bleeding. Due to the higher risk of restenosis at the ostium of the RCA, I was hesitant to place a bare-metal stent in this vessel. The Synergy drug-eluting  stent has a bioabsorbable polymer. If clopidogrel needed to be stopped in the future before a year, this may be safer to do with this type of stent...     Past Medical History:  Diagnosis Date   Anemia    Anginal pain ([place])    Arteriovenous malformation of gastrointestinal tract    Arthritis    "fingers mostly" (04/12/2015)   Carotid artery disease ([place])    Carotid US 3/17:  96-43% RICA; 83-81% LICA; Elevated bilateral subclavian artery velocities >>f/u 1 year. // Carotid US 4/18: R 40-59; L 1-39 >> FU 1 year // Carotid US 10/2018: R 40-59; L 1-39, L subclavian stenosis    CHF (congestive heart failure) (HCC)    Chronic kidney disease (CKD), stage III (moderate) (HCC)    Chronic lower GI bleeding    "today; last time was ~ 8 yr ago; used to have them often before that too" (02/11/2013)  Coronary artery disease    DJD (degenerative joint disease)    Dysrhythmia    Heart murmur    History of blood transfusion    "a few times over the years; usually related to my Coumadin" (04/12/2015   History of gout    Hyperlipemia    Hypertension    Macula lutea degeneration    Old MI (myocardial infarction)    "a coulple /dr in 02/2008; I never even knew I'd had them" (04/12/2015)   Type II diabetes mellitus ([place])    no medications, diet controlled    Past Surgical History:  Procedure Laterality Date   [place]   BIOPSY  11/06/2019   Procedure: BIOPSY;  Surgeon: Doran Stabler, MD;  Location: [employer] [facility];  Service: Gastroenterology;;   CARDIAC CATHETERIZATION  ~1990   CARDIAC CATHETERIZATION  04/12/2015   Procedure: Coronary Stent Intervention;  Surgeon: Jettie Booze, MD;    SYNERGY DES 3.5X16 to the ostial RCA    CARDIAC CATHETERIZATION  04/12/2015   Procedure: Coronary/Graft Angiography;  Surgeon: Eloy End, MD; LAD & CFX 100%, patent LIMA-LAD, SVG-D1; SVG-OM-PDA first limb 100%, 2nd limb patent; oRCA 75%>0 w/ stent   [place]. Jude/notes 10/29/2003 (02/11/2013)   CARDIOVERSION N/A 08/15/2019   Procedure: CARDIOVERSION;  Surgeon: Thayer Headings, MD;  Location: [facility] ENDOSCOPY;  Service: Cardiovascular;  Laterality: N/A;   COLONOSCOPY N/A 02/13/2013   Procedure: COLONOSCOPY;  Surgeon: Lafayette Dragon, MD;  Location: [facility] ENDOSCOPY;  Service: Endoscopy;  Laterality: N/A;   CORONARY ANGIOPLASTY WITH STENT PLACEMENT  2009+   "3 at least; put in 1 stent at a time" (02/11/2013)   CORONARY ARTERY BYPASS GRAFT  1990   LIMA-LAD, SVG-OM-PDA, SVG-D1   DILATION AND CURETTAGE OF UTERUS  1960's   'after a miscarriage" (02/11/2013)   ENTEROSCOPY N/A 02/13/2013   Procedure: ENTEROSCOPY;  Surgeon: [place] Dragon, MD;  Location: [facility] ENDOSCOPY;  Service: Endoscopy;  Laterality: N/A;   ENTEROSCOPY N/A 11/06/2019   Procedure: ENTEROSCOPY;  Surgeon: Doran Stabler, MD;  Location: [facility] ENDOSCOPY;  Service: Gastroenterology;  Laterality: N/A;   ESOPHAGOGASTRODUODENOSCOPY (EGD) WITH PROPOFOL N/A 12/01/2019   Procedure: ESOPHAGOGASTRODUODENOSCOPY (EGD) WITH PROPOFOL;  Surgeon: Mauri Pole, MD;  Location: [facility] ENDOSCOPY;  Service: Endoscopy;  Laterality: N/A;   EXPLORATORY LAPAROTOMY  02/24/2008   which revealed a retroperitoneal hematoma and bleeding from the right external iliac artery/notes 03/02/2008 (02/11/2013)    FLEXIBLE SIGMOIDOSCOPY N/A 04/21/2015   Procedure: FLEXIBLE SIGMOIDOSCOPY;  Surgeon: Ladene Artist, MD;  Location: [facility] ENDOSCOPY;  Service: Endoscopy;  Laterality: N/A;   HOT HEMOSTASIS N/A 11/06/2019   Procedure: HOT HEMOSTASIS (ARGON PLASMA COAGULATION/BICAP);  Surgeon: Doran Stabler, MD;  Location: [place];  Service: Gastroenterology;  Laterality: N/A;   TEE WITHOUT CARDIOVERSION N/A 12/01/2018   Procedure: TRANSESOPHAGEAL ECHOCARDIOGRAM (TEE);  Surgeon: Jerline Pain, MD;  Location: [facility] ENDOSCOPY;  Service: Cardiovascular;  Laterality: N/A;    MEDICATIONS:  amiodarone (PACERONE) 200 MG tablet    atorvastatin (LIPITOR) 80 MG tablet   bevacizumab (AVASTIN) 1.25 mg/0.1 mL SOLN   ezetimibe (ZETIA) 10 MG tablet   FeFum-FePo-FA-B Cmp-C-Zn-Mn-Cu (SE-TAN PLUS) 144-315.4-0 MG CAPS   folic acid (FOLVITE) 086 MCG tablet   furosemide (LASIX) 40 MG tablet   isosorbide mononitrate (IMDUR) 60 MG 24 hr tablet   JANTOVEN 5 MG tablet   losartan (COZAAR) 50 MG tablet  Multiple Vitamin (MULTIVITAMIN WITH MINERALS) TABS   Multiple Vitamins-Minerals (PRESERVISION AREDS 2 PO)   nitroGLYCERIN (NITROSTAT) 0.4 MG SL tablet   Omega-3 Fatty Acids (FISH OIL) 600 MG CAPS   oxyCODONE (OXY IR/ROXICODONE) 5 MG immediate release tablet   No current facility-administered medications for this encounter.    Myra Gianotti, PA-C Surgical Short Stay/Anesthesiology [facility] Phone [phone] [facility] Phone [phone] 05/30/2022 5:06 PM

## 2022-05-30 NOTE — Anesthesia Preprocedure Evaluation (Signed)
Anesthesia Evaluation  Patient identified by MRN, date of birth, ID band Patient awake    Reviewed: Allergy & Precautions, NPO status , Patient's Chart, lab work & pertinent test results  Airway Mallampati: II  TM Distance: >3 FB Neck ROM: Full    Dental  (+) Partial Lower, Partial Upper   Pulmonary neg pulmonary ROS,    Pulmonary exam normal        Cardiovascular hypertension, Pt. on medications + CAD, + Past MI, + Cardiac Stents, + CABG and +CHF  Normal cardiovascular exam+ dysrhythmias   ECG: rate 82   Neuro/Psych negative neurological ROS  negative psych ROS   GI/Hepatic negative GI ROS, Neg liver ROS,   Endo/Other  negative endocrine ROSdiabetes  Renal/GU Renal InsufficiencyRenal disease     Musculoskeletal  (+) Arthritis ,   Abdominal   Peds  Hematology  (+) Blood dyscrasia, anemia ,   Anesthesia Other Findings HERNIATION OF LUMBAR INTERVERTEBRAL DISC WITH RADICULOPATHY  Reproductive/Obstetrics                           Anesthesia Physical Anesthesia Plan  ASA: 3  Anesthesia Plan: General   Post-op Pain Management:    Induction: Intravenous  PONV Risk Score and Plan: 3 and Ondansetron, Dexamethasone and Treatment may vary due to age or medical condition  Airway Management Planned: Oral ETT  Additional Equipment:   Intra-op Plan:   Post-operative Plan: Extubation in OR  Informed Consent: I have reviewed the patients History and Physical, chart, labs and discussed the procedure including the risks, benefits and alternatives for the proposed anesthesia with the patient or authorized representative who has indicated his/her understanding and acceptance.     Dental advisory given  Plan Discussed with: CRNA  Anesthesia Plan Comments: (PAT note written 05/30/2022 by Myra Gianotti, PA-C. )      Anesthesia Quick Evaluation

## 2022-05-30 NOTE — Progress Notes (Signed)
PCP - Dr. Gaynelle Arabian Cardiologist - Dr. Larae Grooms  PPM/ICD - denies   Chest x-ray - 11/03/19 EKG - 12/06/21 Stress Test - 04/03/11 ECHO - 12/01/18 Cardiac Cath - 04/12/15  Sleep Study - 10+ years ago per pt, OSA- CPAP - n/a  DM- Type 2 Fasting Blood Sugar - 80-100 Checks Blood Sugar once a day  Blood Thinner Instructions: Hold Jantoven 5 days prior to surgery (no bridge) Aspirin Instructions: n/a  ERAS Protcol - no, NPO   COVID TEST- n/a   Anesthesia review: yes, cardiac hx  Patient denies shortness of breath, fever, cough and chest pain at PAT appointment   All instructions explained to the patient, with a verbal understanding of the material. Patient agrees to go over the instructions while at home for a better understanding. The opportunity to ask questions was provided.

## 2022-06-02 ENCOUNTER — Other Ambulatory Visit: Payer: Self-pay

## 2022-06-02 ENCOUNTER — Ambulatory Visit (HOSPITAL_COMMUNITY): Payer: Medicare Other | Admitting: Vascular Surgery

## 2022-06-02 ENCOUNTER — Ambulatory Visit (HOSPITAL_BASED_OUTPATIENT_CLINIC_OR_DEPARTMENT_OTHER): Payer: Medicare Other | Admitting: Certified Registered"

## 2022-06-02 ENCOUNTER — Encounter (HOSPITAL_COMMUNITY)
Admission: RE | Disposition: A | Discharge status: Home / Self Care | Payer: Self-pay | Source: Ambulatory Visit | Attending: Neurosurgery

## 2022-06-02 ENCOUNTER — Ambulatory Visit (HOSPITAL_COMMUNITY)
Admission: RE | Admit: 2022-06-02 | Discharge: 2022-06-03 | Disposition: A | Discharge status: Home / Self Care | Payer: Medicare Other | Source: Ambulatory Visit | Attending: Neurosurgery | Admitting: Neurosurgery

## 2022-06-02 ENCOUNTER — Ambulatory Visit (HOSPITAL_COMMUNITY): Payer: Medicare Other

## 2022-06-02 DIAGNOSIS — M5126 Other intervertebral disc displacement, lumbar region: Secondary | ICD-10-CM | POA: Diagnosis present

## 2022-06-02 DIAGNOSIS — I2510 Atherosclerotic heart disease of native coronary artery without angina pectoris: Secondary | ICD-10-CM | POA: Insufficient documentation

## 2022-06-02 DIAGNOSIS — Z79899 Other long term (current) drug therapy: Secondary | ICD-10-CM | POA: Insufficient documentation

## 2022-06-02 DIAGNOSIS — Z955 Presence of coronary angioplasty implant and graft: Secondary | ICD-10-CM | POA: Diagnosis not present

## 2022-06-02 DIAGNOSIS — Z951 Presence of aortocoronary bypass graft: Secondary | ICD-10-CM | POA: Diagnosis not present

## 2022-06-02 DIAGNOSIS — Z9229 Personal history of other drug therapy: Secondary | ICD-10-CM

## 2022-06-02 DIAGNOSIS — I509 Heart failure, unspecified: Secondary | ICD-10-CM

## 2022-06-02 DIAGNOSIS — M5116 Intervertebral disc disorders with radiculopathy, lumbar region: Secondary | ICD-10-CM | POA: Insufficient documentation

## 2022-06-02 DIAGNOSIS — I252 Old myocardial infarction: Secondary | ICD-10-CM | POA: Diagnosis not present

## 2022-06-02 DIAGNOSIS — Z7901 Long term (current) use of anticoagulants: Secondary | ICD-10-CM

## 2022-06-02 DIAGNOSIS — M1990 Unspecified osteoarthritis, unspecified site: Secondary | ICD-10-CM | POA: Insufficient documentation

## 2022-06-02 DIAGNOSIS — I110 Hypertensive heart disease with heart failure: Secondary | ICD-10-CM | POA: Insufficient documentation

## 2022-06-02 DIAGNOSIS — Z981 Arthrodesis status: Secondary | ICD-10-CM | POA: Diagnosis not present

## 2022-06-02 DIAGNOSIS — I700 Atherosclerosis of aorta: Secondary | ICD-10-CM | POA: Diagnosis not present

## 2022-06-02 DIAGNOSIS — E1139 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: Secondary | ICD-10-CM

## 2022-06-02 HISTORY — PX: LUMBAR LAMINECTOMY/DECOMPRESSION MICRODISCECTOMY: SHX5026

## 2022-06-02 LAB — GLUCOSE, CAPILLARY
Glucose-Capillary: 109 mg/dL — ABNORMAL HIGH (ref 70–99)
Glucose-Capillary: 140 mg/dL — ABNORMAL HIGH (ref 70–99)
Glucose-Capillary: 125 mg/dL — ABNORMAL HIGH (ref 70–99)
Glucose-Capillary: 277 mg/dL — ABNORMAL HIGH (ref 70–99)

## 2022-06-02 LAB — PROTIME-INR
INR: 1.3 — ABNORMAL HIGH (ref 0.8–1.2)
Prothrombin Time: 16.3 seconds — ABNORMAL HIGH (ref 11.4–15.2)

## 2022-06-02 LAB — APTT: aPTT: 31 seconds (ref 24–36)

## 2022-06-02 SURGERY — LUMBAR LAMINECTOMY/DECOMPRESSION MICRODISCECTOMY 1 LEVEL
Anesthesia: General | Site: Spine Lumbar | Laterality: Left

## 2022-06-02 MED ORDER — MENTHOL 3 MG MT LOZG: 1.0000 | LOZENGE | OROMUCOSAL | Status: DC | PRN

## 2022-06-02 MED ORDER — FENTANYL CITRATE (PF) 250 MCG/5ML IJ SOLN
INTRAMUSCULAR | Status: DC | PRN
  Administered 2022-06-02: 100 ug via INTRAVENOUS
  Administered 2022-06-02: 50 ug via INTRAVENOUS

## 2022-06-02 MED ORDER — PHENOL 1.4 % MT LIQD: 1.0000 | OROMUCOSAL | Status: DC | PRN

## 2022-06-02 MED ORDER — FENTANYL CITRATE (PF) 250 MCG/5ML IJ SOLN
INTRAMUSCULAR | Status: AC
  Filled 2022-06-02: qty 5

## 2022-06-02 MED ORDER — CHLORHEXIDINE GLUCONATE CLOTH 2 % EX PADS: 6.0000 | MEDICATED_PAD | Freq: Once | CUTANEOUS | Status: DC

## 2022-06-02 MED ORDER — ONDANSETRON HCL 4 MG/2ML IJ SOLN: 4.0000 mg | Freq: Once | INTRAMUSCULAR | Status: DC | PRN

## 2022-06-02 MED ORDER — OXYCODONE HCL 5 MG PO TABS: 5.0000 mg | ORAL_TABLET | ORAL | Status: DC | PRN

## 2022-06-02 MED ORDER — ATORVASTATIN CALCIUM 80 MG PO TABS
80.0000 mg | ORAL_TABLET | Freq: Every day | ORAL | Status: DC
  Administered 2022-06-02: 80 mg via ORAL
  Filled 2022-06-02: qty 1

## 2022-06-02 MED ORDER — ALBUTEROL SULFATE (2.5 MG/3ML) 0.083% IN NEBU
INHALATION_SOLUTION | RESPIRATORY_TRACT | Status: AC
  Filled 2022-06-02: qty 3

## 2022-06-02 MED ORDER — EZETIMIBE 10 MG PO TABS
10.0000 mg | ORAL_TABLET | Freq: Every day | ORAL | Status: DC
  Filled 2022-06-02: qty 1

## 2022-06-02 MED ORDER — ACETAMINOPHEN 10 MG/ML IV SOLN: 1000.0000 mg | Freq: Once | INTRAVENOUS | Status: DC | PRN

## 2022-06-02 MED ORDER — BUPIVACAINE-EPINEPHRINE (PF) 0.5% -1:200000 IJ SOLN
INTRAMUSCULAR | Status: DC | PRN
  Administered 2022-06-02: 10 mL

## 2022-06-02 MED ORDER — ZOLPIDEM TARTRATE 5 MG PO TABS: 5.0000 mg | ORAL_TABLET | Freq: Every evening | ORAL | Status: DC | PRN

## 2022-06-02 MED ORDER — VANCOMYCIN HCL IN DEXTROSE 1-5 GM/200ML-% IV SOLN
1000.0000 mg | INTRAVENOUS | Status: AC
  Administered 2022-06-02: 1000 mg via INTRAVENOUS
  Filled 2022-06-02: qty 200

## 2022-06-02 MED ORDER — AMISULPRIDE (ANTIEMETIC) 5 MG/2ML IV SOLN: 10.0000 mg | Freq: Once | INTRAVENOUS | Status: DC | PRN

## 2022-06-02 MED ORDER — CYCLOBENZAPRINE HCL 10 MG PO TABS
10.0000 mg | ORAL_TABLET | Freq: Three times a day (TID) | ORAL | Status: DC | PRN
  Administered 2022-06-02 – 2022-06-03 (×2): 10 mg via ORAL
  Filled 2022-06-02 (×2): qty 1

## 2022-06-02 MED ORDER — BISACODYL 10 MG RE SUPP: 10.0000 mg | Freq: Every day | RECTAL | Status: DC | PRN

## 2022-06-02 MED ORDER — ALBUTEROL SULFATE (2.5 MG/3ML) 0.083% IN NEBU
2.5000 mg | INHALATION_SOLUTION | Freq: Once | RESPIRATORY_TRACT | Status: AC
  Administered 2022-06-02: 2.5 mg via RESPIRATORY_TRACT

## 2022-06-02 MED ORDER — ACETAMINOPHEN 500 MG PO TABS
1000.0000 mg | ORAL_TABLET | Freq: Four times a day (QID) | ORAL | Status: DC
  Administered 2022-06-02 – 2022-06-03 (×2): 1000 mg via ORAL
  Filled 2022-06-02 (×2): qty 2

## 2022-06-02 MED ORDER — NITROGLYCERIN 0.3 MG SL SUBL: 0.3000 mg | SUBLINGUAL_TABLET | SUBLINGUAL | Status: DC | PRN

## 2022-06-02 MED ORDER — FUROSEMIDE 40 MG PO TABS
40.0000 mg | ORAL_TABLET | Freq: Every day | ORAL | Status: DC
  Administered 2022-06-02: 40 mg via ORAL
  Filled 2022-06-02 (×2): qty 1

## 2022-06-02 MED ORDER — SUGAMMADEX SODIUM 200 MG/2ML IV SOLN
INTRAVENOUS | Status: DC | PRN
  Administered 2022-06-02: 200 mg via INTRAVENOUS

## 2022-06-02 MED ORDER — LACTATED RINGERS IV SOLN: INTRAVENOUS | Status: DC

## 2022-06-02 MED ORDER — AMIODARONE HCL 200 MG PO TABS
200.0000 mg | ORAL_TABLET | Freq: Every day | ORAL | Status: DC
  Filled 2022-06-02: qty 1

## 2022-06-02 MED ORDER — DOCUSATE SODIUM 100 MG PO CAPS
100.0000 mg | ORAL_CAPSULE | Freq: Two times a day (BID) | ORAL | Status: DC
  Administered 2022-06-02: 100 mg via ORAL
  Filled 2022-06-02 (×2): qty 1

## 2022-06-02 MED ORDER — LIDOCAINE 2% (20 MG/ML) 5 ML SYRINGE
INTRAMUSCULAR | Status: AC
  Filled 2022-06-02: qty 5

## 2022-06-02 MED ORDER — BACITRACIN ZINC 500 UNIT/GM EX OINT
TOPICAL_OINTMENT | CUTANEOUS | Status: DC | PRN
  Administered 2022-06-02: 1 via TOPICAL

## 2022-06-02 MED ORDER — ROCURONIUM BROMIDE 10 MG/ML (PF) SYRINGE
PREFILLED_SYRINGE | INTRAVENOUS | Status: AC
  Filled 2022-06-02: qty 10

## 2022-06-02 MED ORDER — MORPHINE SULFATE (PF) 4 MG/ML IV SOLN: 4.0000 mg | INTRAVENOUS | Status: DC | PRN

## 2022-06-02 MED ORDER — ACETAMINOPHEN 325 MG PO TABS: 650.0000 mg | ORAL_TABLET | ORAL | Status: DC | PRN

## 2022-06-02 MED ORDER — DEXAMETHASONE SODIUM PHOSPHATE 10 MG/ML IJ SOLN
INTRAMUSCULAR | Status: AC
  Filled 2022-06-02: qty 1

## 2022-06-02 MED ORDER — DEXAMETHASONE SODIUM PHOSPHATE 10 MG/ML IJ SOLN
INTRAMUSCULAR | Status: DC | PRN
  Administered 2022-06-02: 10 mg via INTRAVENOUS

## 2022-06-02 MED ORDER — CHLORHEXIDINE GLUCONATE 0.12 % MT SOLN
15.0000 mL | Freq: Once | OROMUCOSAL | Status: AC
  Administered 2022-06-02: 15 mL via OROMUCOSAL
  Filled 2022-06-02: qty 15

## 2022-06-02 MED ORDER — ROCURONIUM BROMIDE 10 MG/ML (PF) SYRINGE
PREFILLED_SYRINGE | INTRAVENOUS | Status: DC | PRN
  Administered 2022-06-02: 40 mg via INTRAVENOUS

## 2022-06-02 MED ORDER — LOSARTAN POTASSIUM 50 MG PO TABS
50.0000 mg | ORAL_TABLET | Freq: Every day | ORAL | Status: DC
  Filled 2022-06-02: qty 1

## 2022-06-02 MED ORDER — ONDANSETRON HCL 4 MG/2ML IJ SOLN: 4.0000 mg | Freq: Four times a day (QID) | INTRAMUSCULAR | Status: DC | PRN

## 2022-06-02 MED ORDER — PROPOFOL 10 MG/ML IV BOLUS
INTRAVENOUS | Status: AC
  Filled 2022-06-02: qty 20

## 2022-06-02 MED ORDER — FENTANYL CITRATE (PF) 100 MCG/2ML IJ SOLN
INTRAMUSCULAR | Status: AC
  Filled 2022-06-02: qty 2

## 2022-06-02 MED ORDER — THROMBIN 5000 UNITS EX SOLR: OROMUCOSAL | Status: DC | PRN

## 2022-06-02 MED ORDER — SODIUM CHLORIDE 0.9% FLUSH: 3.0000 mL | INTRAVENOUS | Status: DC | PRN

## 2022-06-02 MED ORDER — BUPIVACAINE-EPINEPHRINE (PF) 0.5% -1:200000 IJ SOLN
INTRAMUSCULAR | Status: AC
  Filled 2022-06-02: qty 30

## 2022-06-02 MED ORDER — ONDANSETRON HCL 4 MG/2ML IJ SOLN
INTRAMUSCULAR | Status: AC
  Filled 2022-06-02: qty 2

## 2022-06-02 MED ORDER — SODIUM CHLORIDE 0.9 % IV SOLN: 250.0000 mL | INTRAVENOUS | Status: DC

## 2022-06-02 MED ORDER — FENTANYL CITRATE (PF) 100 MCG/2ML IJ SOLN
25.0000 ug | INTRAMUSCULAR | Status: DC | PRN
  Administered 2022-06-02 (×2): 25 ug via INTRAVENOUS

## 2022-06-02 MED ORDER — ISOSORBIDE MONONITRATE ER 60 MG PO TB24
60.0000 mg | ORAL_TABLET | Freq: Every day | ORAL | Status: DC
  Administered 2022-06-02: 60 mg via ORAL
  Filled 2022-06-02 (×3): qty 1

## 2022-06-02 MED ORDER — PHENYLEPHRINE 80 MCG/ML (10ML) SYRINGE FOR IV PUSH (FOR BLOOD PRESSURE SUPPORT)
PREFILLED_SYRINGE | INTRAVENOUS | Status: DC | PRN
  Administered 2022-06-02 (×3): 160 ug via INTRAVENOUS

## 2022-06-02 MED ORDER — 0.9 % SODIUM CHLORIDE (POUR BTL) OPTIME
TOPICAL | Status: DC | PRN
  Administered 2022-06-02: 1000 mL

## 2022-06-02 MED ORDER — ONDANSETRON HCL 4 MG PO TABS: 4.0000 mg | ORAL_TABLET | Freq: Four times a day (QID) | ORAL | Status: DC | PRN

## 2022-06-02 MED ORDER — ACETAMINOPHEN 650 MG RE SUPP: 650.0000 mg | RECTAL | Status: DC | PRN

## 2022-06-02 MED ORDER — ORAL CARE MOUTH RINSE: 15.0000 mL | Freq: Once | OROMUCOSAL | Status: AC

## 2022-06-02 MED ORDER — EPHEDRINE SULFATE-NACL 50-0.9 MG/10ML-% IV SOSY
PREFILLED_SYRINGE | INTRAVENOUS | Status: DC | PRN
  Administered 2022-06-02 (×2): 10 mg via INTRAVENOUS

## 2022-06-02 MED ORDER — SODIUM CHLORIDE 0.9% FLUSH
3.0000 mL | Freq: Two times a day (BID) | INTRAVENOUS | Status: DC
  Administered 2022-06-02: 3 mL via INTRAVENOUS

## 2022-06-02 MED ORDER — INSULIN ASPART 100 UNIT/ML IJ SOLN: 0.0000 [IU] | INTRAMUSCULAR | Status: DC | PRN

## 2022-06-02 MED ORDER — THROMBIN 5000 UNITS EX SOLR
CUTANEOUS | Status: AC
  Filled 2022-06-02: qty 5000

## 2022-06-02 MED ORDER — LIDOCAINE 2% (20 MG/ML) 5 ML SYRINGE
INTRAMUSCULAR | Status: DC | PRN
  Administered 2022-06-02: 60 mg via INTRAVENOUS

## 2022-06-02 MED ORDER — PROPOFOL 10 MG/ML IV BOLUS
INTRAVENOUS | Status: DC | PRN
  Administered 2022-06-02: 150 mg via INTRAVENOUS

## 2022-06-02 MED ORDER — CEFAZOLIN SODIUM-DEXTROSE 2-4 GM/100ML-% IV SOLN
2.0000 g | Freq: Three times a day (TID) | INTRAVENOUS | Status: AC
  Administered 2022-06-02 – 2022-06-03 (×2): 2 g via INTRAVENOUS
  Filled 2022-06-02 (×2): qty 100

## 2022-06-02 MED ORDER — PHENYLEPHRINE HCL-NACL 20-0.9 MG/250ML-% IV SOLN
INTRAVENOUS | Status: DC | PRN
  Administered 2022-06-02: 50 ug/min via INTRAVENOUS

## 2022-06-02 MED ORDER — ONDANSETRON HCL 4 MG/2ML IJ SOLN
INTRAMUSCULAR | Status: DC | PRN
  Administered 2022-06-02: 4 mg via INTRAVENOUS

## 2022-06-02 MED ORDER — OXYCODONE HCL 5 MG PO TABS
10.0000 mg | ORAL_TABLET | ORAL | Status: DC | PRN
  Administered 2022-06-02 – 2022-06-03 (×4): 10 mg via ORAL
  Filled 2022-06-02 (×4): qty 2

## 2022-06-02 SURGICAL SUPPLY — 45 items
APL SKNCLS STERI-STRIP NONHPOA (GAUZE/BANDAGES/DRESSINGS) ×1
BAG COUNTER SPONGE SURGICOUNT (BAG) ×2 IMPLANT
BAG SPNG CNTER NS LX DISP (BAG) ×2
BAND INSRT 18 STRL LF DISP RB (MISCELLANEOUS) ×2
BAND RUBBER #18 3X1/16 STRL (MISCELLANEOUS) ×4 IMPLANT
BENZOIN TINCTURE PRP APPL 2/3 (GAUZE/BANDAGES/DRESSINGS) ×2 IMPLANT
BUR MATCHSTICK NEURO 3.0 LAGG (BURR) ×2 IMPLANT
BUR PRECISION FLUTE 6.0 (BURR) ×2 IMPLANT
CANISTER SUCT 3000ML PPV (MISCELLANEOUS) ×2 IMPLANT
CARTRIDGE OIL MAESTRO DRILL (MISCELLANEOUS) ×2 IMPLANT
DIFFUSER DRILL AIR PNEUMATIC (MISCELLANEOUS) ×2 IMPLANT
DRAPE LAPAROTOMY 100X72X124 (DRAPES) ×2 IMPLANT
DRAPE MICROSCOPE SLANT 54X150 (MISCELLANEOUS) ×2 IMPLANT
DRAPE SURG 17X23 STRL (DRAPES) ×8 IMPLANT
DRSG OPSITE POSTOP 4X6 (GAUZE/BANDAGES/DRESSINGS) IMPLANT
ELECT BLADE 4.0 EZ CLEAN MEGAD (MISCELLANEOUS) ×1
ELECT REM PT RETURN 9FT ADLT (ELECTROSURGICAL) ×1
ELECTRODE BLDE 4.0 EZ CLN MEGD (MISCELLANEOUS) ×2 IMPLANT
ELECTRODE REM PT RTRN 9FT ADLT (ELECTROSURGICAL) ×2 IMPLANT
GAUZE 4X4 16PLY ~~LOC~~+RFID DBL (SPONGE) IMPLANT
GAUZE SPONGE 4X4 12PLY STRL (GAUZE/BANDAGES/DRESSINGS) ×2 IMPLANT
GLOVE BIO SURGEON STRL SZ8 (GLOVE) ×2 IMPLANT
GLOVE BIO SURGEON STRL SZ8.5 (GLOVE) ×2 IMPLANT
GOWN STRL REUS W/ TWL LRG LVL3 (GOWN DISPOSABLE) IMPLANT
GOWN STRL REUS W/ TWL XL LVL3 (GOWN DISPOSABLE) ×2 IMPLANT
GOWN STRL REUS W/TWL 2XL LVL3 (GOWN DISPOSABLE) IMPLANT
GOWN STRL REUS W/TWL LRG LVL3 (GOWN DISPOSABLE) ×2
GOWN STRL REUS W/TWL XL LVL3 (GOWN DISPOSABLE) ×2
HEMOSTAT POWDER KIT SURGIFOAM (HEMOSTASIS) IMPLANT
KIT BASIN OR (CUSTOM PROCEDURE TRAY) ×2 IMPLANT
KIT TURNOVER KIT B (KITS) ×2 IMPLANT
NDL HYPO 21X1.5 SAFETY (NEEDLE) IMPLANT
NEEDLE HYPO 21X1.5 SAFETY (NEEDLE) IMPLANT
NEEDLE HYPO 22GX1.5 SAFETY (NEEDLE) ×2 IMPLANT
NS IRRIG 1000ML POUR BTL (IV SOLUTION) ×2 IMPLANT
OIL CARTRIDGE MAESTRO DRILL (MISCELLANEOUS) ×1
PACK LAMINECTOMY NEURO (CUSTOM PROCEDURE TRAY) ×2 IMPLANT
PAD ARMBOARD 7.5X6 YLW CONV (MISCELLANEOUS) ×6 IMPLANT
STRIP CLOSURE SKIN 1/2X4 (GAUZE/BANDAGES/DRESSINGS) ×2 IMPLANT
SUT VIC AB 1 CT1 18XBRD ANBCTR (SUTURE) ×2 IMPLANT
SUT VIC AB 1 CT1 8-18 (SUTURE) ×1
SUT VIC AB 2-0 CP2 18 (SUTURE) ×2 IMPLANT
TOWEL GREEN STERILE (TOWEL DISPOSABLE) ×2 IMPLANT
TOWEL GREEN STERILE FF (TOWEL DISPOSABLE) ×2 IMPLANT
WATER STERILE IRR 1000ML POUR (IV SOLUTION) ×2 IMPLANT

## 2022-06-02 NOTE — Anesthesia Postprocedure Evaluation (Signed)
Anesthesia Post Note  Patient: [name]  Procedure(s) Performed: LEFT LUMBAR FOUR-FIVE MICRODISCECTOMY (Left: Spine Lumbar)     Patient location during evaluation: PACU Anesthesia Type: General Level of consciousness: awake and alert Pain management: pain level controlled Vital Signs Assessment: post-procedure vital signs reviewed and stable Respiratory status: spontaneous breathing, nonlabored ventilation, respiratory function stable and patient connected to nasal cannula oxygen Cardiovascular status: blood pressure returned to baseline and stable Postop Assessment: no apparent nausea or vomiting Anesthetic complications: no   No notable events documented.  Last Vitals:  Vitals:   06/02/22 1850 06/02/22 1922  BP: (!) 142/62 139/68  Pulse: 75 78  Resp: 18 17  Temp: 37.1 C 36.5 C  SpO2: 98% 100%    Last Pain:  Vitals:   06/02/22 2049  TempSrc:   PainSc: [place]

## 2022-06-02 NOTE — Anesthesia Procedure Notes (Signed)
Procedure Name: Intubation Date/Time: 06/02/2022 3:57 PM  Performed by: Lance Coon, CRNAPre-anesthesia Checklist: Emergency Drugs available, Patient identified, Suction available, Patient being monitored and Timeout performed Patient Re-evaluated:Patient Re-evaluated prior to induction Oxygen Delivery Method: Circle system utilized Preoxygenation: Pre-oxygenation with 100% oxygen Induction Type: IV induction Ventilation: Mask ventilation without difficulty Laryngoscope Size: Miller and 3 Grade View: Grade I Tube type: Oral Tube size: 7.0 mm Number of attempts: 1 Airway Equipment and Method: Stylet Placement Confirmation: ETT inserted through vocal cords under direct vision, positive ETCO2 and breath sounds checked- equal and bilateral Secured at: 21 cm Tube secured with: Tape Dental Injury: Teeth and Oropharynx as per pre-operative assessment

## 2022-06-02 NOTE — Op Note (Signed)
Brief history: The patient is an 88 year old white female who is complained of back left buttock and left leg pain consistent with a lumbar radiculopathy.  She has failed medical management and was worked up with a lumbar MRI.  This demonstrated a herniated disc at L4-5 on the left.  I discussed the various treatment options with her.  She has decided proceed with surgery.  Preoperative diagnosis: Left L4-5 herniated disc, lumbar radiculopathy, lumbago  Postoperative diagnosis: The same  Procedure: Left L4-5 intervertebral discectomy using micro-dissection  Surgeon: Dr. Earle Gell  Asst.: Arnetha Massy, NP  Anesthesia: Gen. endotracheal  Estimated blood loss: Minimal  Drains: None  Complications: None  Description of procedure: The patient was brought to the operating room by the anesthesia team. General endotracheal anesthesia was induced. The patient was turned to the prone position on the Wilson frame. The patient's lumbosacral region was then prepared with Betadine scrub and Betadine solution. Sterile drapes were applied.  I then injected the area to be incised with Marcaine with epinephrine solution. I then used a scalpel to make a linear midline incision over the L4-5 intervertebral disc space. I then used electrocautery to perform a left sided subperiosteal dissection exposing the spinous process and lamina of L4-5. We obtained intraoperative radiograph to confirm our location. I then inserted the [facility] retractor for exposure.  We then brought the operative microscope into the field. Under its magnification and illumination we completed the microdissection. I used a high-speed drill to perform a laminotomy at L4-5 on the left. I then used a Kerrison punches to widen the laminotomy and removed the ligamentum flavum at L4-5 on the left. We then used microdissection to free up the thecal sac and the left L5 nerve root from the epidural tissue. I then used a Kerrison punch to  perform a foraminotomy at about the left L5 nerve root. We then using the nerve root retractor to gently retract the thecal sac and the left L5 nerve root medially. This exposed the intervertebral disc. We identified the ruptured disc and remove it with the pituitary forceps.  I inspected the intervertebral disc.  There was no large holes in the annulus nor impending herniations.  We therefore did not perform an intervertebral discectomy.  I then palpated along the ventral surface of the thecal sac and along exit route of the left L5 nerve root and noted that the neural structures were well decompressed. This completed the decompression.  We then obtained hemostasis using bipolar electrocautery. We irrigated the wound out with saline solution. We then removed the retractor. We then reapproximated the patient's thoracolumbar fascia with interrupted #1 Vicryl suture. We then reapproximated the patient's subcutaneous tissue with interrupted 2-0 Vicryl suture. We then reapproximated patient's skin with Steri-Strips and benzoin. The was then coated with bacitracin ointment. The drapes were removed. The patient was subsequently returned to the supine position where they were extubated by the anesthesia team. The patient was then transported to the postanesthesia care unit in stable condition. All sponge instrument and needle counts were reportedly correct at the end of this case.

## 2022-06-02 NOTE — Transfer of Care (Signed)
Immediate Anesthesia Transfer of Care Note  Patient: [name]  Procedure(s) Performed: LEFT LUMBAR FOUR-FIVE MICRODISCECTOMY (Left: Spine Lumbar)  Patient Location: PACU  Anesthesia Type:General  Level of Consciousness: awake, alert  and oriented  Airway & Oxygen Therapy: Patient Spontanous Breathing  Post-op Assessment: Report given to RN and Post -op Vital signs reviewed and stable  Post vital signs: Reviewed and stable  Last Vitals:  Vitals Value Taken Time  BP 152/59 06/02/22 1720  Temp    Pulse 74 06/02/22 1723  Resp 18 06/02/22 1723  SpO2 99 % 06/02/22 1723  Vitals shown include unvalidated device data.  Last Pain:  Vitals:   06/02/22 1242  TempSrc:   PainSc: 6          Complications: No notable events documented.

## 2022-06-02 NOTE — H&P (Signed)
Subjective: The patient is an 88 year old white female who is complaining of pain in her back rating down her left leg.  She has failed medical management.  She was worked up with a lumbar MRI which demonstrated a left L4-5 herniated disc.  I discussed the various treatment options with her.  She has decided proceed with surgery.  Past Medical History:  Diagnosis Date   Anemia    Anginal pain ([place])    Arteriovenous malformation of gastrointestinal tract    Arthritis    "fingers mostly" (04/12/2015)   Carotid artery disease ([place])    Carotid US 3/17:  44-01% RICA; 02-72% LICA; Elevated bilateral subclavian artery velocities >>f/u 1 year. // Carotid US 4/18: R 40-59; L 1-39 >> FU 1 year // Carotid US 10/2018: R 40-59; L 1-39, L subclavian stenosis    CHF (congestive heart failure) (HCC)    Chronic kidney disease (CKD), stage III (moderate) (HCC)    Chronic lower GI bleeding    "today; last time was ~ 8 yr ago; used to have them often before that too" (02/11/2013)   Coronary artery disease    DJD (degenerative joint disease)    Dysrhythmia    Heart murmur    History of blood transfusion    "a few times over the years; usually related to my Coumadin" (04/12/2015   History of gout    Hyperlipemia    Hypertension    Macula lutea degeneration    Old MI (myocardial infarction)    "a coulple /dr in 02/2008; I never even knew I'd had them" (04/12/2015)   Type II diabetes mellitus ([place])    no medications, diet controlled    Past Surgical History:  Procedure Laterality Date   AORTIC VALVE REPLACEMENT  09/23/1991   St. Jude   APPENDECTOMY  09/23/1951   BIOPSY  11/06/2019   Procedure: BIOPSY;  Surgeon: Doran Stabler, MD;  Location: [facility] ENDOSCOPY;  Service: Gastroenterology;;   CARDIAC CATHETERIZATION  ~1990   CARDIAC CATHETERIZATION  04/12/2015   Procedure: Coronary Stent Intervention;  Surgeon: Jettie Booze, MD;    SYNERGY DES 3.5X16 to the ostial RCA    CARDIAC CATHETERIZATION   04/12/2015   Procedure: Coronary/Graft Angiography;  Surgeon: Eloy End, MD; LAD & CFX 100%, patent LIMA-LAD, SVG-D1; SVG-OM-PDA first limb 100%, 2nd limb patent; oRCA 75%>0 w/ stent   CARDIAC VALVE REPLACEMENT  09/22/1992   St. Jude/notes 10/29/2003 (02/11/2013)   CARDIOVERSION N/A 08/15/2019   Procedure: CARDIOVERSION;  Surgeon: Thayer Headings, MD;  Location: [facility] ENDOSCOPY;  Service: Cardiovascular;  Laterality: N/A;   COLONOSCOPY N/A 02/13/2013   Procedure: COLONOSCOPY;  Surgeon: Lafayette Dragon, MD;  [facility]COPY;  Service: Endoscopy;  Laterality: N/A;   CORONARY ANGIOPLASTY WITH STENT PLACEMENT  2009+   "3 at least; put in 1 stent at a time" (02/11/2013)   CORONARY ARTERY BYPASS GRAFT  09/22/1988   LIMA-LAD, SVG-OM-PDA, SVG-D1   DILATION AND CURETTAGE OF UTERUS  05/24/1959   'after a miscarriage" (02/11/2013)   ENTEROSCOPY N/A 02/13/2013   Procedure: ENTEROSCOPY;  Surgeon: Lafayette Dragon, MD;  Location: [facility] ENDOSCOPY;  Service: Endoscopy;  Laterality: N/A;   ENTEROSCOPY N/A 11/06/2019   Procedure: ENTEROSCOPY;  Surgeon: Doran Stabler, MD;  Location: [place];  Service: Gastroenterology;  Laterality: N/A;   ESOPHAGOGASTRODUODENOSCOPY (EGD) WITH PROPOFOL N/A 12/01/2019   Procedure: ESOPHAGOGASTRODUODENOSCOPY (EGD) WITH PROPOFOL;  Surgeon: Mauri Pole, MD;  Location: [facility] ENDOSCOPY;  Service: Endoscopy;  Laterality:  N/A;   EXPLORATORY LAPAROTOMY  02/24/2008   which revealed a retroperitoneal hematoma and bleeding from the right external iliac artery/notes 03/02/2008 (02/11/2013)    FLEXIBLE SIGMOIDOSCOPY N/A 04/21/2015   Procedure: FLEXIBLE SIGMOIDOSCOPY;  Surgeon: Ladene Artist, MD;  Location: [facility] ENDOSCOPY;  Service: Endoscopy;  Laterality: N/A;   HOT HEMOSTASIS N/A 11/06/2019   Procedure: HOT HEMOSTASIS (ARGON PLASMA COAGULATION/BICAP);  Surgeon: Doran Stabler, MD;  Location: [place];  Service: Gastroenterology;  Laterality: N/A;   TEE WITHOUT  CARDIOVERSION N/A 12/01/2018   Procedure: TRANSESOPHAGEAL ECHOCARDIOGRAM (TEE);  Surgeon: Jerline Pain, MD;  Location: [facility] ENDOSCOPY;  Service: Cardiovascular;  Laterality: N/A;    Allergies  Allergen Reactions   Darvon [Propoxyphene] Nausea And Vomiting   Lisinopril Cough   Septra [Sulfamethoxazole-Trimethoprim] Other (See Comments)    Increased INR   Warfarin And Related Other (See Comments)    ONLY TOLERATES BRAND   Trazodone And Nefazodone     Severe sweating   Zolpidem Tartrate     Hallucinations   Penicillins Rash    Did it involve swelling of the face/tongue/throat, SOB, or low BP? No Did it involve sudden or severe rash/hives, skin peeling, or any reaction on the inside of your mouth or nose? No Did you need to seek medical attention at a hospital or doctor's office? No When did it last happen?  15 years     If all above answers are "NO", may proceed with cephalosporin use.    Social History   Tobacco Use   Smoking status: Never   Smokeless tobacco: Never  Substance Use Topics   Alcohol use: No    Family History  Problem Relation Age of Onset   Heart disease Mother    Heart disease Father    Prior to Admission medications   Medication Sig Start Date End Date Taking? Authorizing Provider  amiodarone (PACERONE) 200 MG tablet Take 1 tablet by mouth once daily 03/10/22  Yes Jettie Booze, MD  atorvastatin (LIPITOR) 80 MG tablet Take 1 tablet by mouth once daily 05/12/22  Yes Jettie Booze, MD  bevacizumab (AVASTIN) 1.25 mg/0.1 mL SOLN Place 1.25 mg into the left eye every 8 (eight) weeks.   Yes [provider]  ezetimibe (ZETIA) 10 MG tablet Take 1 tablet by mouth once daily 02/03/22  Yes Jettie Booze, MD  FeFum-FePo-FA-B Cmp-C-Zn-Mn-Cu (SE-TAN PLUS) 162-115.2-1 MG CAPS Take 1 capsule by mouth 2 (two) times daily.  05/22/15  Yes [provider]  folic acid (FOLVITE) 710 MCG tablet Take 400 mcg by mouth every evening.    Yes [provider]  furosemide (LASIX) 40 MG tablet Take 40 mg by mouth daily. 11/19/19  Yes [provider]  isosorbide mononitrate (IMDUR) 60 MG 24 hr tablet Take 1 tablet by mouth once daily 05/07/22  Yes Varanasi, Charlann Lange, MD  JANTOVEN 5 MG tablet TAKE 1/2 TO 1 (ONE-HALF TO ONE) TABLET BY MOUTH ONCE DAILY AS DIRECTED BY  COUMADIN  CLINIC Patient taking differently: Take 2.5-5 mg by mouth See admin instructions. Take 5 mg by mouth on Monday and take 2.5 mg on all remaining days of the week. 01/13/22  Yes Jettie Booze, MD  losartan (COZAAR) 50 MG tablet Take 50 mg by mouth daily.   Yes [provider]  Multiple Vitamin (MULTIVITAMIN WITH MINERALS) TABS Take 1 tablet by mouth daily.   Yes [provider]  Multiple Vitamins-Minerals (PRESERVISION AREDS 2 PO) Take 1 tablet  by mouth 2 (two) times daily.    Yes [provider]  Omega-3 Fatty Acids (FISH OIL) 600 MG CAPS Take 1,200 mg by mouth in the morning and at bedtime.   Yes [provider]  oxyCODONE (OXY IR/ROXICODONE) 5 MG immediate release tablet Take 5 mg by mouth at bedtime. 05/07/22  Yes [provider]  nitroGLYCERIN (NITROSTAT) 0.4 MG SL tablet DISSOLVE ONE TABLET UNDER THE TONGUE EVERY 5 MINUTES AS NEEDED FOR CHEST PAIN.  DO NOT EXCEED A TOTAL OF 3 DOSES IN 15 MINUTES 02/14/20   Jettie Booze, MD     Review of Systems  Positive ROS: As above  All other systems have been reviewed and were otherwise negative with the exception of those mentioned in the HPI and as above.  Objective: Vital signs in last 24 hours: Temp:  [98.1 F (36.7 C)] 98.1 F (36.7 C) (09/11 1215) Pulse Rate:  [86] 86 (09/11 1215) BP: (172)/(68) 172/68 (09/11 1215) SpO2:  [96 %] 96 % (09/11 1215) Weight:  [63.5 kg] 63.5 kg (09/11 1215) Estimated body mass index is 27.34 kg/m as calculated from the following:   Height as of this encounter: 5' (1.524 m).   Weight as of this encounter: 63.5  kg.   General Appearance: Alert Head: Normocephalic, without obvious abnormality, atraumatic Eyes: PERRL, conjunctiva/corneas clear, EOM's intact,    Ears: Normal  Throat: Normal  Neck: Supple, Back: unremarkable Lungs: Clear to auscultation bilaterally, respirations unlabored Heart: Regular rate and rhythm, no murmur, rub or gallop Abdomen: Soft, non-tender Extremities: Extremities normal, atraumatic, no cyanosis or edema Skin: unremarkable  NEUROLOGIC:   Mental status: alert and oriented,Motor Exam - grossly normal Sensory Exam - grossly normal Reflexes:  Coordination - grossly normal Gait - grossly normal Balance - grossly normal Cranial Nerves: I: smell Not tested  II: visual acuity  OS: Normal  OD: Normal   II: visual fields Full to confrontation  II: pupils Equal, round, reactive to light  III,VII: ptosis None  III,IV,VI: extraocular muscles  Full ROM  V: mastication Normal  V: facial light touch sensation  Normal  V,VII: corneal reflex  Present  VII: facial muscle function - upper  Normal  VII: facial muscle function - lower Normal  VIII: hearing Not tested  IX: soft palate elevation  Normal  IX,X: gag reflex Present  XI: trapezius strength  5/5  XI: sternocleidomastoid strength 5/5  XI: neck flexion strength  5/5  XII: tongue strength  Normal    Data Review Lab Results  Component Value Date   WBC 7.8 05/30/2022   HGB 11.3 (L) 05/30/2022   HCT 34.8 (L) 05/30/2022   MCV 95.9 05/30/2022   PLT 202 05/30/2022   Lab Results  Component Value Date   NA 138 05/30/2022   K 4.3 05/30/2022   CL 106 05/30/2022   CO2 24 05/30/2022   BUN 48 (H) 05/30/2022   CREATININE 1.63 (H) 05/30/2022   GLUCOSE 112 (H) 05/30/2022   Lab Results  Component Value Date   INR 1.3 (H) 06/02/2022    Assessment/Plan: Lumbar spondylolisthesis, lumbar disc, lumbar radiculopathy, lumbago: I discussed the situation with the patient.  I reviewed her imaging studies with her and  pointed out the abnormalities.  We have discussed the various treatment options including a left L4-5 discectomy.  I have shown her surgical models.  I have given her a surgical pamphlet we have discussed the risk, benefits, alternatives, expected postoperative course and likelihood of achieving our  goals with surgery.  I have answered all her questions.  She has decided proceed with surgery.   Ophelia Charter 06/02/2022 2:56 PM

## 2022-06-03 ENCOUNTER — Encounter (HOSPITAL_COMMUNITY): Payer: Self-pay | Admitting: Neurosurgery

## 2022-06-03 DIAGNOSIS — Z951 Presence of aortocoronary bypass graft: Secondary | ICD-10-CM | POA: Diagnosis not present

## 2022-06-03 DIAGNOSIS — Z79899 Other long term (current) drug therapy: Secondary | ICD-10-CM | POA: Diagnosis not present

## 2022-06-03 DIAGNOSIS — M5116 Intervertebral disc disorders with radiculopathy, lumbar region: Secondary | ICD-10-CM | POA: Diagnosis not present

## 2022-06-03 DIAGNOSIS — I2510 Atherosclerotic heart disease of native coronary artery without angina pectoris: Secondary | ICD-10-CM | POA: Diagnosis not present

## 2022-06-03 DIAGNOSIS — I252 Old myocardial infarction: Secondary | ICD-10-CM | POA: Diagnosis not present

## 2022-06-03 DIAGNOSIS — I110 Hypertensive heart disease with heart failure: Secondary | ICD-10-CM | POA: Diagnosis not present

## 2022-06-03 LAB — GLUCOSE, CAPILLARY: Glucose-Capillary: 172 mg/dL — ABNORMAL HIGH (ref 70–99)

## 2022-06-03 MED ORDER — DOCUSATE SODIUM 100 MG PO CAPS: 100.0000 mg | ORAL_CAPSULE | Freq: Two times a day (BID) | ORAL | 0 refills | Status: DC

## 2022-06-03 MED ORDER — HYDROCODONE-ACETAMINOPHEN 5-325 MG PO TABS: 1.0000 | ORAL_TABLET | ORAL | 0 refills | Status: DC | PRN

## 2022-06-03 MED ORDER — HYDROCODONE-ACETAMINOPHEN 5-325 MG PO TABS: 1.0000 | ORAL_TABLET | ORAL | Status: DC | PRN

## 2022-06-03 NOTE — Discharge Summary (Signed)
Physician Discharge Summary  Patient ID: [name] MRN: [id number] DOB/AGE: [birth year] 88 y.o.  Admit date: 06/02/2022 Discharge date: 06/03/2022  Admission Diagnoses: Lumbar disc, lumbar radiculopathy, lumbago  Discharge Diagnoses: The same Principal Problem:   Lumbar herniated disc   Discharged Condition: good  Hospital Course: I performed a left L4-5 discectomy on the patient on 06/02/2022.  The surgery went well.  The patient's postoperative course was unremarkable.  On postoperative day #1 she felt well and requested discharge home.  She was given verbal and written discharge instructions.  All her questions were answered.  Consults: PT, OT, care management Significant Diagnostic Studies: None Treatments: Left L4-5 discectomy using microdissection Discharge Exam: Blood pressure (!) 158/62, pulse 80, temperature 97.8 F (36.6 C), temperature source Oral, resp. rate 20, height 5' (1.524 m), weight 63.5 kg, SpO2 97 %. The patient is alert and pleasant.  She looks well.  The dressing has a bloodstain.  Her strength is normal.  Disposition: Home  Discharge Instructions     Call MD for:  difficulty breathing, headache or visual disturbances   Complete by: As directed    Call MD for:  extreme fatigue   Complete by: As directed    Call MD for:  hives   Complete by: As directed    Call MD for:  persistant dizziness or light-headedness   Complete by: As directed    Call MD for:  persistant nausea and vomiting   Complete by: As directed    Call MD for:  redness, tenderness, or signs of infection (pain, swelling, redness, odor or green/yellow discharge around incision site)   Complete by: As directed    Call MD for:  severe uncontrolled pain   Complete by: As directed    Call MD for:  temperature >100.4   Complete by: As directed    Diet - low sodium heart healthy   Complete by: As directed    Discharge instructions   Complete by: As directed    Call [phone] for a  followup appointment. Take a stool softener while you are using pain medications.   Driving Restrictions   Complete by: As directed    Do not drive for 2 weeks.   Increase activity slowly   Complete by: As directed    Lifting restrictions   Complete by: As directed    Do not lift more than 5 pounds. No excessive bending or twisting.   May shower / Bathe   Complete by: As directed    Remove the dressing for 3 days after surgery.  You may shower, but leave the incision alone.   Remove dressing in 48 hours   Complete by: As directed       Allergies as of 06/03/2022       Reactions   Darvon [propoxyphene] Nausea And Vomiting   Lisinopril Cough   Septra [sulfamethoxazole-trimethoprim] Other (See Comments)   Increased INR   Warfarin And Related Other (See Comments)   ONLY TOLERATES BRAND   Trazodone And Nefazodone    Severe sweating   Zolpidem Tartrate    Hallucinations   Penicillins Rash   Did it involve swelling of the face/tongue/throat, SOB, or low BP? No Did it involve sudden or severe rash/hives, skin peeling, or any reaction on the inside of your mouth or nose? No Did you need to seek medical attention at a hospital or doctor's office? No When did it last happen?  15 years     If all above  answers are "NO", may proceed with cephalosporin use.        Medication List     STOP taking these medications    oxyCODONE 5 MG immediate release tablet Commonly known as: Oxy IR/ROXICODONE       TAKE these medications    amiodarone 200 MG tablet Commonly known as: PACERONE Take 1 tablet by mouth once daily   atorvastatin 80 MG tablet Commonly known as: LIPITOR Take 1 tablet by mouth once daily   bevacizumab 1.25 mg/0.1 mL Soln Commonly known as: AVASTIN Place 1.25 mg into the left eye every 8 (eight) weeks.   docusate sodium 100 MG capsule Commonly known as: COLACE Take 1 capsule (100 mg total) by mouth 2 (two) times daily.   ezetimibe 10 MG tablet Commonly  known as: ZETIA Take 1 tablet by mouth once daily   Fish Oil 600 MG Caps Take 1,200 mg by mouth in the morning and at bedtime.   folic acid 893 MCG tablet Commonly known as: FOLVITE Take 400 mcg by mouth every evening.   furosemide 40 MG tablet Commonly known as: LASIX Take 40 mg by mouth daily.   HYDROcodone-acetaminophen 5-325 MG tablet Commonly known as: NORCO/VICODIN Take 1 tablet by mouth every 4 (four) hours as needed for moderate pain.   isosorbide mononitrate 60 MG 24 hr tablet Commonly known as: IMDUR Take 1 tablet by mouth once daily   Jantoven 5 MG tablet Generic drug: warfarin Take as directed. If you are unsure how to take this medication, talk to your nurse or doctor. Original instructions: TAKE 1/2 TO 1 (ONE-HALF TO ONE) TABLET BY MOUTH ONCE DAILY AS DIRECTED BY  COUMADIN  CLINIC What changed: See the new instructions.   losartan 50 MG tablet Commonly known as: COZAAR Take 50 mg by mouth daily.   multivitamin with minerals Tabs tablet Take 1 tablet by mouth daily.   nitroGLYCERIN 0.4 MG SL tablet Commonly known as: NITROSTAT DISSOLVE ONE TABLET UNDER THE TONGUE EVERY 5 MINUTES AS NEEDED FOR CHEST PAIN.  DO NOT EXCEED A TOTAL OF 3 DOSES IN 15 MINUTES   PRESERVISION AREDS 2 PO Take 1 tablet by mouth 2 (two) times daily.   Se-Tan PLUS 162-115.2-1 MG Caps Take 1 capsule by mouth 2 (two) times daily.         Signed: Ophelia Charter 06/03/2022, 7:46 AM

## 2022-06-03 NOTE — Evaluation (Signed)
Occupational Therapy Evaluation Patient Details Name: [name] MRN: [id number] DOB: Aug 24, 1933 Today's Date: 06/03/2022   History of Present Illness 88 yo female s/p 9/11 L lumbar L4-5 Microdiscectomy PMH arthritis, CAD CHF CKD III chronic GIB, DJD, HLD HTN MI DM2 aortic valve replacement,   Clinical Impression   Patient is s/p L4-5 microdiscectomy surgery resulting in functional limitations due to the deficits listed below (see OT problem list). Pt with poor recall of precautions and noted to have delayed response to simple commands. Pt verbalized concerns with memory and needing information written for spouse. Pt unable to verbalize where she lives. Pt reports living in [place] but chart states [place] area. Recommend family assist with management of all medications. Patient will benefit from skilled OT acutely to increase independence and safety with ADLS to allow discharge [place].       Recommendations for follow up therapy are one component of a multi-disciplinary discharge planning process, led by the attending physician.  Recommendations may be updated based on patient status, additional functional criteria and insurance authorization.   Follow Up Recommendations  Home health OT    Assistance Recommended at Discharge Set up Supervision/Assistance  Patient can return home with the following A little help with bathing/dressing/bathroom;Direct supervision/assist for medications management;Assist for transportation    Functional Status Assessment  Patient has had a recent decline in their functional status and demonstrates the ability to make significant improvements in function in a reasonable and predictable amount of time.  Equipment Recommendations  None recommended by OT (has all DME)    Recommendations for Other Services PT consult     Precautions / Restrictions Precautions Precautions: Back Precaution Comments: handout provided and reviewed for adls       Mobility Bed Mobility               General bed mobility comments: sitting eob on arrival. Educated on needing to roll    Transfers Overall transfer level: Needs assistance Equipment used: Rolling walker (2 wheels) Transfers: Sit to/from Stand Sit to Stand: Min assist           General transfer comment: mod cues for hand placement to push from surface and not pull on RW. pt with increased time to sequence task      Balance Overall balance assessment: Mild deficits observed, not formally tested                                         ADL either performed or assessed with clinical judgement   ADL Overall ADL's : Needs assistance/impaired Eating/Feeding: Independent   Grooming: Wash/dry face;Oral care;Wash/dry hands;Supervision/safety;Standing Grooming Details (indicate cue type and reason): cues for back precautions         Upper Body Dressing : Supervision/safety;Sitting   Lower Body Dressing: Min guard;Sit to/from stand;With adaptive equipment Lower Body Dressing Details (indicate cue type and reason): pt was unable to sequence reacher without max cues. pt sequeeze and not releasing and holding it upside down. Toilet Transfer: Supervision/safety;Rolling walker (2 wheels);Regular Toilet   Toileting- Clothing Manipulation and Hygiene: Supervision/safety       Functional mobility during ADLs: Min guard;Rolling walker (2 wheels) General ADL Comments: cues for back precautions and avoid bending. pt is able to thread L LE. Pt with increased difficulty with R LE. pt encouraged to dress first but iniste on dressing LLE.     Vision  Baseline Vision/History: 1 Wears glasses Vision Assessment?: No apparent visual deficits     Perception     Praxis      Pertinent Vitals/Pain Pain Assessment Pain Assessment: 0-10 Pain Score: 7  Pain Location: back Pain Descriptors / Indicators: Discomfort, Operative site guarding Pain Intervention(s):  Monitored during session, Premedicated before session, Repositioned     Hand Dominance Right   Extremity/Trunk Assessment Upper Extremity Assessment Upper Extremity Assessment: Generalized weakness (tremor noted bil)   Lower Extremity Assessment Lower Extremity Assessment: Defer to PT evaluation   Cervical / Trunk Assessment Cervical / Trunk Assessment: Back Surgery   Communication Communication Communication: No difficulties   Cognition Arousal/Alertness: Awake/alert Behavior During Therapy: Flat affect Overall Cognitive Status: Impaired/Different from baseline Area of Impairment: Memory, Problem solving                     Memory: Decreased recall of precautions, Decreased short-term memory       Problem Solving: Slow processing General Comments: no family present during evaluation. pt reports not knowing where she lives because she hasnt driven in years now. Ot further questioned adn pt was able to state "[place]". pt asking if information was written down because she wont remember     General Comments  dressing dry and intact at this time.    Exercises     Shoulder Instructions      Home Living Family/patient expects to be discharged to:: Private residence Living Arrangements: Spouse/significant other Available Help at Discharge: Family;Available 24 hours/day Type of Home: House Home Access: Stairs to enter [employer] of Steps: 3 Entrance Stairs-Rails: Right Home Layout: One level     Bathroom Shower/Tub: Occupational psychologist: Standard     Home Equipment: Conservation officer, nature (2 wheels);Grab bars - tub/shower;Shower seat;Cane - single point;Wheelchair - manual;Hand held shower head;Adaptive equipment Adaptive Equipment: Other (Comment);Long-handled shoe horn Additional Comments: spouse drives, has not driven for a long while no animals      Prior Functioning/Environment Prior Level of Function : Independent/Modified  Independent;Other (comment) (no family present)                        OT Problem List: Decreased strength;Decreased activity tolerance;Impaired balance (sitting and/or standing);Decreased cognition;Decreased safety awareness;Decreased knowledge of use of DME or AE;Decreased knowledge of precautions      OT Treatment/Interventions: Self-care/ADL training;Therapeutic exercise;Neuromuscular education;Energy conservation;DME and/or AE instruction;Manual therapy;Modalities;Therapeutic activities;Cognitive remediation/compensation;Patient/family education;Balance training    OT Goals(Current goals can be found in the care plan section) Acute Rehab OT Goals Patient Stated Goal: to make sure her spouse knowns the precautions OT Goal Formulation: With patient Time For Goal Achievement: 06/17/22 Potential to Achieve Goals: Good  OT Frequency: Min 2X/week    Co-evaluation              AM-PAC OT "6 Clicks" Daily Activity     Outcome Measure Help from another person eating meals?: None Help from another person taking care of personal grooming?: None Help from another person toileting, which includes using toliet, bedpan, or urinal?: A Little Help from another person bathing (including washing, rinsing, drying)?: A Little Help from another person to put on and taking off regular upper body clothing?: A Little Help from another person to put on and taking off regular lower body clothing?: A Little 6 Click Score: 20   End of Session Equipment Utilized During Treatment: Gait belt;Rolling walker (2 wheels) Nurse Communication: Mobility status;Precautions  Activity Tolerance: Patient tolerated treatment well Patient left: in chair;with call bell/phone within reach  OT Visit Diagnosis: Unsteadiness on feet (R26.81);Muscle weakness (generalized) (M62.81)                Time: 2103-1281 OT Time Calculation (min): 23 min Charges:  OT General Charges $OT Visit: 1 Visit OT  Evaluation $OT Eval Moderate Complexity: 1 Mod   Brynn, OTR/L  Acute Rehabilitation Services Office: [phone] .   Jeri Modena 06/03/2022, 9:53 AM

## 2022-06-03 NOTE — Evaluation (Signed)
Physical Therapy Evaluation and Discharge Patient Details Name: [name] MRN: [id number] DOB: July 21, 1933 Today's Date: 06/03/2022  History of Present Illness  88 yo female s/p 9/11 L lumbar L4-5 Microdiscectomy PMH arthritis, CAD CHF CKD III chronic GIB, DJD, HLD HTN MI DM2 aortic valve replacement,  Clinical Impression   Patient evaluated by Physical Therapy with no further acute PT needs identified. All education has been completed with pt and husband and they have no further questions. Patient was able to ascend/descend 3 steps with 1 rail with min assist. PT is signing off. Thank you for this referral.        Recommendations for follow up therapy are one component of a multi-disciplinary discharge planning process, led by the attending physician.  Recommendations may be updated based on patient status, additional functional criteria and insurance authorization.  Follow Up Recommendations Follow physician's recommendations for discharge plan and follow up therapies      Assistance Recommended at Discharge Frequent or constant Supervision/Assistance  Patient can return home with the following  A little help with walking and/or transfers;A little help with bathing/dressing/bathroom;Assistance with cooking/housework;Direct supervision/assist for medications management;Direct supervision/assist for financial management;Assist for transportation;Help with stairs or ramp for entrance    Equipment Recommendations None recommended by PT  Recommendations for Other Services       Functional Status Assessment Patient has not had a recent decline in their functional status     Precautions / Restrictions Precautions Precautions: Back Precaution Comments: handout provided and reviewed with pt/husband re: mobility Restrictions Weight Bearing Restrictions: No      Mobility  Bed Mobility               General bed mobility comments: sitting up in chair on arrival. Reviewed  in/out of bed with pt/husband    Transfers Overall transfer level: Needs assistance Equipment used: Rolling walker (2 wheels) Transfers: Sit to/from Stand Sit to Stand: Min assist           General transfer comment: cues for hand placement to push from surface and not pull on RW. pt with increased time to sequence task    Ambulation/Gait Ambulation/Gait assistance: Min guard Gait Distance (Feet): 90 Feet Assistive device: Rolling walker (2 wheels) Gait Pattern/deviations: Step-through pattern, Decreased stride length   Gait velocity interpretation: 1.31 - 2.62 ft/sec, indicative of limited community ambulator   General Gait Details: husband pleased that she is no longer limping; vc for proximity to [employer] Stairs: Yes Stairs assistance: Min assist Stair Management: One rail Right, Forwards Number of Stairs: 3 General stair comments: husband present and normally assists on her left side ascending; right side descending (with her arm around his shoulders)  Wheelchair Mobility    Modified Rankin (Stroke Patients Only)       Balance Overall balance assessment: Mild deficits observed, not formally tested                                           Pertinent Vitals/Pain Pain Assessment Pain Assessment: Faces Faces Pain Scale: Hurts a little bit Pain Location: back Pain Descriptors / Indicators: Discomfort, Operative site guarding Pain Intervention(s): Limited activity within patient's tolerance, Monitored during session    Home Living Family/patient expects to be discharged to:: Private residence Living Arrangements: Spouse/significant other Available Help at Discharge: Family;Available 24 hours/day Type of Home: House Home Access: Stairs to enter  Entrance Stairs-Rails: Right Entrance Stairs-Number of Steps: 3   Home Layout: One level Home Equipment: Rolling Walker (2 wheels);Grab bars - tub/shower;Shower seat;Cane - single point;Wheelchair  - manual;Hand held shower head;Adaptive equipment Additional Comments: spouse drives, has not driven for a long while no animals    Prior Function Prior Level of Function : Independent/Modified Independent;Other (comment) (no family present)             Mobility Comments: per husband has had a limp for past 3 months; has fallen 9 times in 3 months       Hand Dominance   Dominant Hand: Right    Extremity/Trunk Assessment   Upper Extremity Assessment Upper Extremity Assessment: Defer to OT evaluation    Lower Extremity Assessment Lower Extremity Assessment: Generalized weakness    Cervical / Trunk Assessment Cervical / Trunk Assessment: Back Surgery  Communication   Communication: No difficulties  Cognition Arousal/Alertness: Awake/alert Behavior During Therapy: Flat affect Overall Cognitive Status: History of cognitive impairments - at baseline (per husband) Area of Impairment: Memory, Problem solving                     Memory: Decreased recall of precautions, Decreased short-term memory       Problem Solving: Slow processing General Comments: husband present and reports he manages all her medicine        General Comments General comments (skin integrity, edema, etc.): dressing dry and intact at this time.    Exercises     Assessment/Plan    PT Assessment Patient does not need any further PT services  PT Problem List         PT Treatment Interventions      PT Goals (Current goals can be found in the Care Plan section)  Acute Rehab PT Goals PT Goal Formulation: All assessment and education complete, DC therapy    Frequency       Co-evaluation               AM-PAC PT "6 Clicks" Mobility  Outcome Measure Help needed turning from your back to your side while in a flat bed without using bedrails?: A Little Help needed moving from lying on your back to sitting on the side of a flat bed without using bedrails?: A Little Help needed  moving to and from a bed to a chair (including a wheelchair)?: A Little Help needed standing up from a chair using your arms (e.g., wheelchair or bedside chair)?: A Little Help needed to walk in hospital room?: A Little Help needed climbing 3-5 steps with a railing? : A Little 6 Click Score: 18    End of Session Equipment Utilized During Treatment: Gait belt Activity Tolerance: Patient tolerated treatment well Patient left: in chair;with call bell/phone within reach;with family/visitor present Nurse Communication: Mobility status;Other (comment) (ok for discharge) PT Visit Diagnosis: Other abnormalities of gait and mobility (R26.89)    Time: 5573-2202 PT Time Calculation (min) (ACUTE ONLY): 14 min   Charges:   PT Evaluation $PT Eval Low Complexity: [place], PT Acute Rehabilitation Services  Office [phone]   Rexanne Mano 06/03/2022, 10:51 AM

## 2022-06-03 NOTE — Plan of Care (Signed)
Pt and husband given D/C instructions with verbal understanding. Rx's were sent to the pharmacy by MD. Pt's incision is clean and dry with no sign of infection. Pt's IV was removed prior to D/C. Pt D/C'd home via wheelchair per MD order. Pt is stable @ D/C and has no other needs at this time. Anterrio Mccleery, RN 

## 2022-06-13 ENCOUNTER — Ambulatory Visit: Payer: Medicare Other | Attending: Interventional Cardiology | Admitting: *Deleted

## 2022-06-13 DIAGNOSIS — Z5181 Encounter for therapeutic drug level monitoring: Secondary | ICD-10-CM | POA: Diagnosis not present

## 2022-06-13 DIAGNOSIS — Z7901 Long term (current) use of anticoagulants: Secondary | ICD-10-CM | POA: Diagnosis not present

## 2022-06-13 DIAGNOSIS — Z952 Presence of prosthetic heart valve: Secondary | ICD-10-CM | POA: Insufficient documentation

## 2022-06-13 DIAGNOSIS — I480 Paroxysmal atrial fibrillation: Secondary | ICD-10-CM | POA: Diagnosis not present

## 2022-06-13 LAB — POCT INR: INR: 1.4 — AB (ref 2.0–3.0)

## 2022-06-13 NOTE — Patient Instructions (Signed)
Description   Today and tomorrow take 1 tablet then continue taking 1/2 tablet daily except 1 tablet on Mondays. Recheck INR in 1 week. Call our office if you have any problems or concerns [phone].

## 2022-06-20 ENCOUNTER — Ambulatory Visit: Payer: Medicare Other | Attending: Interventional Cardiology

## 2022-06-20 DIAGNOSIS — I480 Paroxysmal atrial fibrillation: Secondary | ICD-10-CM | POA: Diagnosis not present

## 2022-06-20 DIAGNOSIS — Z952 Presence of prosthetic heart valve: Secondary | ICD-10-CM | POA: Diagnosis not present

## 2022-06-20 DIAGNOSIS — Z7901 Long term (current) use of anticoagulants: Secondary | ICD-10-CM

## 2022-06-20 DIAGNOSIS — Z5181 Encounter for therapeutic drug level monitoring: Secondary | ICD-10-CM

## 2022-06-20 LAB — POCT INR: INR: 2.9 (ref 2.0–3.0)

## 2022-06-20 NOTE — Patient Instructions (Signed)
continue taking 1/2 tablet daily except 1 tablet on Mondays. Recheck INR in 2 weeks. Call our office if you have any problems or concerns [phone]. Eat greens today and Saturday.

## 2022-06-27 DIAGNOSIS — Z23 Encounter for immunization: Secondary | ICD-10-CM | POA: Diagnosis not present

## 2022-07-04 ENCOUNTER — Ambulatory Visit: Payer: Medicare Other | Attending: Interventional Cardiology

## 2022-07-04 DIAGNOSIS — Z952 Presence of prosthetic heart valve: Secondary | ICD-10-CM | POA: Diagnosis not present

## 2022-07-04 DIAGNOSIS — Z5181 Encounter for therapeutic drug level monitoring: Secondary | ICD-10-CM | POA: Diagnosis not present

## 2022-07-04 DIAGNOSIS — Z7901 Long term (current) use of anticoagulants: Secondary | ICD-10-CM

## 2022-07-04 DIAGNOSIS — I480 Paroxysmal atrial fibrillation: Secondary | ICD-10-CM

## 2022-07-04 LAB — POCT INR: INR: 3.2 — AB (ref 2.0–3.0)

## 2022-07-04 NOTE — Patient Instructions (Signed)
HOLD TONIGHT ONLY and then DECREASE TO 0.5 DAILY.Marland Kitchen Recheck INR in 2 weeks. Call our office if you have any problems or concerns [phone].

## 2022-07-09 NOTE — Progress Notes (Unsigned)
Cardiology Office Note   Date:  07/10/2022   ID:  [name], [name] 1932/11/01, MRN [id number]  PCP:  Gaynelle Arabian, MD    No chief complaint on file.  CAD  Wt Readings from Last 3 Encounters:  07/10/22 151 lb 3.2 oz (68.6 kg)  06/02/22 140 lb (63.5 kg)  05/30/22 151 lb 14.4 oz (68.9 kg)       History of Present Illness: [name] is a 88 y.o. female  with a hx of CAD and aortic valve disease status post CABG plus mechanical AVR done in [place] in the 1990s.  Subsequent PCI to the RCA with BMS, HTN, HL, diabetes, prior GI bleed, CKD, diastolic CHF, PAF.    She was evaluated in 7/16 for anginal symptoms.  LHC demonstrated severe native three-vessel disease. LIMA-LAD was patent, SVG-diagonal patent, SVG-OM/PDA occluded. The second half of the OM/PDA graft was open. RCA stents were patent. She had an ostial RCA lesion of 75% which was treated with a Synergy DES. Because she needs Coumadin for her mechanical AVR, she was continued on Coumadin plus Plavix only (no aspirin).   She had some melena after the procedure.  She has had several episodes of GI bleeding in the past.   Patient had sepsis due to gram-negative bacteremia E. coli related to UTI 11/2018.  Echo with normal LV function moderate LVH normally functioning mechanical aortic valve possible mitral valve with small mobile vegetation in the anterior leaflet and small ASD suggested by Doppler.  TEE was negative for vegetations. Patient underwent successful DCCV 08/15/19 for paroxysmal atrial fibrillation.  Shortness of breath improved after this procedure.   She had more GI bleeding in February 2021.  Anticoagulation was held.  She underwent enteroscopy.  She had an argon laser treatment with EGD.  Hemoglobin was 7.3 at the time of discharge.  INR had slightly increased and was therapeutic by November 11, 2019.  It was recommended that aspirin be held for 4 weeks.  Due to bleeding risk, she has not been treated with  theinopyridine, more recently.    At her appt in 2/21, she reported weakness and was found to be in a junctional rhythm.   We stopped metoprolol.  "SHe is on Amio to maintain NSR.  Now with junctional rhythm on ECG. May be related to anemia."   Repeat ECG in 3/21 showed sinus rhythm.  She had some high blood pressure readings in March 2023.  Plan was to add amlodipine if home readings were high.  She had a LUMBAR MICRODISCECTOMY Date of procedure: 06/02/22  She has had several falls.  Broken ankle in 6/23 from slipped disc.    Denies : Chest pain. Dizziness. Nitroglycerin use. Orthopnea. Palpitations. Paroxysmal nocturnal dyspnea. Shortness of breath. Syncope.    Past Medical History:  Diagnosis Date   Anemia    Anginal pain ([place])    Arteriovenous malformation of gastrointestinal tract    Arthritis    "fingers mostly" (04/12/2015)   Carotid artery disease ([place])    Carotid US 3/17:  90-30% RICA; 09-23% LICA; Elevated bilateral subclavian artery velocities >>f/u 1 year. // Carotid US 4/18: R 40-59; L 1-39 >> FU 1 year // Carotid US 10/2018: R 40-59; L 1-39, L subclavian stenosis    CHF (congestive heart failure) (HCC)    Chronic kidney disease (CKD), stage III (moderate) (HCC)    Chronic lower GI bleeding    "today; last time was ~ 8 yr ago; used to  have them often before that too" (02/11/2013)   Coronary artery disease    DJD (degenerative joint disease)    Dysrhythmia    Heart murmur    History of blood transfusion    "a few times over the years; usually related to my Coumadin" (04/12/2015   History of gout    Hyperlipemia    Hypertension    Macula lutea degeneration    Old MI (myocardial infarction)    "a coulple /dr in 02/2008; I never even knew I'd had them" (04/12/2015)   Type II diabetes mellitus ([place])    no medications, diet controlled    Past Surgical History:  Procedure Laterality Date   AORTIC VALVE REPLACEMENT  09/23/1991   St. Jude   APPENDECTOMY  09/23/1951    BIOPSY  11/06/2019   Procedure: BIOPSY;  Surgeon: Doran Stabler, MD;  [facility] END[facility]COPY;  Service: Gastroenterology;;   CARDIAC CATHETERIZATION  ~1990   CARDIAC CATHETERIZATION  04/12/2015   Procedure: Coronary Stent Intervention;  Surgeon: Jettie Booze, MD;    SYNERGY DES 3.5X16 to the ostial RCA    CARDIAC CATHETERIZATION  04/12/2015   Procedure: Coronary/Graft Angiography;  Surgeon: Eloy End, MD; LAD & CFX 100%, patent LIMA-LAD, SVG-D1; SVG-OM-PDA first limb 100%, 2nd limb patent; oRCA 75%>0 w/ stent   CARDIAC VALVE REPLACEMENT  09/22/1992   St. Jude/notes 10/29/2003 (02/11/2013)   CARDIOVERSION N/A 08/15/2019   Procedure: CARDIOVERSION;  Surgeon: Thayer Headings, MD;  Location: [place] ENDOSCOPY;  Service: Cardiovascular;  Laterality: N/A;   COLONOSCOPY N/A 02/13/2013   Procedure: COLONOSCOPY;  Surgeon: Lafayette Dragon, MD;  Location: [facility] ENDOSCOPY;  Service: Endoscopy;  Laterality: N/A;   CORONARY ANGIOPLASTY WITH STENT PLACEMENT  2009+   "3 at least; put in 1 stent at a time" (02/11/2013)   CORONARY ARTERY BYPASS GRAFT  09/22/1988   LIMA-LAD, SVG-OM-PDA, SVG-D1   DILATION AND CURETTAGE OF UTERUS  05/24/1959   'after a miscarriage" (02/11/2013)   ENTEROSCOPY N/A 02/13/2013   Procedure: ENTEROSCOPY;  Surgeon: Lafayette Dragon, MD;  Location: [facility] ENDOSCOPY;  Service: Endoscopy;  Laterality: N/A;   ENTEROSCOPY N/A 11/06/2019   Procedure: ENTEROSCOPY;  Surgeon: Doran Stabler, MD;  Location: [place];  Service: Gastroenterology;  Laterality: N/A;   ESOPHAGOGASTRODUODENOSCOPY (EGD) WITH PROPOFOL N/A 12/01/2019   Procedure: ESOPHAGOGASTRODUODENOSCOPY (EGD) WITH PROPOFOL;  Surgeon: Mauri Pole, MD;  Location: [place] ENDOSCOPY;  Service: Endoscopy;  Laterality: N/A;   EXPLORATORY LAPAROTOMY  02/24/2008   which revealed a retroperitoneal hematoma and bleeding from the right external iliac artery/notes 03/02/2008 (02/11/2013)    FLEXIBLE SIGMOIDOSCOPY N/A 04/21/2015    Procedure: FLEXIBLE SIGMOIDOSCOPY;  Surgeon: Ladene Artist, MD;  Location[facility] ENDOSCOPY;  Service: Endoscopy;  Laterality: N/A;   HOT HEMOSTASIS N/A 11/06/2019   Procedure: HOT HEMOSTASIS (ARGON PLASMA COAGULATION/BICAP);  Surgeon: Doran Stabler, MD;  Location: [place];  Service: Gastroenterology;  Laterality: N/A;   LUMBAR LAMINECTOMY/DECOMPRESSION MICRODISCECTOMY Left 06/02/2022   Procedure: LEFT LUMBAR FOUR-FIVE MICRODISCECTOMY;  Surgeon: Newman Pies, MD;  Location: [place];  Service: Neurosurgery;  Laterality: Left;  3C   TEE WITHOUT CARDIOVERSION N/A 12/01/2018   Procedure: TRANSESOPHAGEAL ECHOCARDIOGRAM (TEE);  Surgeon: Jerline Pain, MD;  Location: [facility] ENDOSCOPY;  Service: Cardiovascular;  Laterality: N/A;     Current Outpatient Medications  Medication Sig Dispense Refill   amiodarone (PACERONE) 200 MG tablet Take 1 tablet by mouth once daily 90 tablet 2   atorvastatin (LIPITOR) 80 MG tablet  Take 1 tablet by mouth once daily 90 tablet 1   bevacizumab (AVASTIN) 1.25 mg/0.1 mL SOLN Place 1.25 mg into the left eye every 8 (eight) weeks.     docusate sodium (COLACE) 100 MG capsule Take 1 capsule (100 mg total) by mouth 2 (two) times daily. 30 capsule 0   ezetimibe (ZETIA) 10 MG tablet Take 1 tablet by mouth once daily 90 tablet 2   FeFum-FePo-FA-B Cmp-C-Zn-Mn-Cu (SE-TAN PLUS) 162-115.2-1 MG CAPS Take 1 capsule by mouth 2 (two) times daily.   3   folic acid (FOLVITE) 474 MCG tablet Take 400 mcg by mouth every evening.      furosemide (LASIX) 40 MG tablet Take 1 tablet (40 mg total) by mouth 2 (two) times daily as needed. 180 tablet 3   isosorbide mononitrate (IMDUR) 60 MG 24 hr tablet Take 1 tablet by mouth once daily 90 tablet 2   JANTOVEN 5 MG tablet TAKE 1/2 TO 1 (ONE-HALF TO ONE) TABLET BY MOUTH ONCE DAILY AS DIRECTED BY  COUMADIN  CLINIC (Patient taking differently: Take 2.5-5 mg by mouth See admin instructions. Take 5 mg by mouth on Monday and take 2.5 mg on all remaining  days of the week.) 70 tablet 1   losartan (COZAAR) 50 MG tablet Take 50 mg by mouth daily.     Multiple Vitamin (MULTIVITAMIN WITH MINERALS) TABS Take 1 tablet by mouth daily.     Multiple Vitamins-Minerals (PRESERVISION AREDS 2 PO) Take 1 tablet by mouth 2 (two) times daily.      nitroGLYCERIN (NITROSTAT) 0.4 MG SL tablet DISSOLVE ONE TABLET UNDER THE TONGUE EVERY 5 MINUTES AS NEEDED FOR CHEST PAIN.  DO NOT EXCEED A TOTAL OF 3 DOSES IN 15 MINUTES 25 tablet 5   Omega-3 Fatty Acids (FISH OIL) 600 MG CAPS Take 1,200 mg by mouth in the morning and at bedtime.     No current facility-administered medications for this visit.    Allergies:   Darvon [propoxyphene], Lisinopril, Septra [sulfamethoxazole-trimethoprim], Warfarin and related, Trazodone and nefazodone, Zolpidem tartrate, and Penicillins    Social History:  The patient  reports that she has never smoked. She has never used smokeless tobacco. She reports that she does not drink alcohol and does not use drugs.   Family History:  The patient's family history includes Heart disease in her father and mother.    ROS:  Please see the history of present illness.   Otherwise, review of systems are positive for leg weakness.   All other systems are reviewed and negative.    PHYSICAL EXAM: VS:  BP (!) 144/62   Pulse 72   Ht 5' (1.524 m)   Wt 151 lb 3.2 oz (68.6 kg)   SpO2 97%   BMI 29.53 kg/m  , BMI Body mass index is 29.53 kg/m. GEN: Well nourished, well developed, in no acute distress HEENT: normal Neck: no JVD, carotid bruits, or masses Cardiac: RRR, premature beats; crisp S2 click, 2/6 systolic murmur, no rubs, or gallops, 1+ lower extremity edema bilaterally Respiratory:  clear to auscultation bilaterally, normal work of breathing GI: soft, nontender, nondistended, + BS MS: no deformity or atrophy Skin: warm and dry, no rash Neuro:  Strength and sensation are intact Psych: euthymic mood, full affect   EKG:   The ekg ordered  3/23 demonstrates normal sinus rhythm, prolonged PR, PVCs   Recent Labs: 05/30/2022: BUN 48; Creatinine, Ser 1.63; Hemoglobin 11.3; Platelets 202; Potassium 4.3; Sodium 138   Lipid Panel  Component Value Date/Time   CHOL 117 06/29/2019 0953   CHOL 172 05/01/2015 1234   TRIG 153 (H) 06/29/2019 0953   TRIG 248 (H) 05/01/2015 1234   HDL 51 06/29/2019 0953   HDL 46 05/01/2015 1234   CHOLHDL 2.3 06/29/2019 0953   LDLCALC 40 06/29/2019 0953   LDLCALC 76 05/01/2015 1234     Other studies Reviewed: Additional studies/ records that were reviewed today with results demonstrating: Creatinine 1.6 in September 2023.  LDL 74 in May 2023.   ASSESSMENT AND PLAN:  CAD: No angina.  Continue aggressive secondary prevention.  She is not on antiplatelet therapy due to being on warfarin. PAF: On low-dose amiodarone to try to maintain sinus rhythm.  Has some premature beats on exam. Chronic anticoagulation: Only needs small doses of Coumadin.  INR will make big jumps with larger doses of Coumadin.  Warfarin for stroke prevention in the setting of her prosthetic aortic valve. Status post AVR: SBE prophylaxis. Hypertension: Reported on control.  Continue current medications.  Try to increase exercise at home. Chronic diastolic heart failure: Mild volume overload as evidenced by the swelling in her legs.  Okay to increase Lasix to 40 mg twice daily as needed.  She will take 40 mg daily every day and on occasion, take the second dose.   Current medicines are reviewed at length with the patient today.  The patient concerns regarding her medicines were addressed.  The following changes have been made:  No change  Labs/ tests ordered today include:  No orders of the defined types were placed in this encounter.   Recommend 150 minutes/week of aerobic exercise Low fat, low carb, high fiber diet recommended  Disposition:   FU in 6 months   Signed, Larae Grooms, MD  07/10/2022 5:08 PM    [facility] [address], [place], [place]  [address] Phone: [phone]; Fax: [phone]

## 2022-07-10 ENCOUNTER — Encounter: Payer: Self-pay | Admitting: Interventional Cardiology

## 2022-07-10 ENCOUNTER — Ambulatory Visit: Payer: Medicare Other | Attending: Interventional Cardiology | Admitting: Interventional Cardiology

## 2022-07-10 VITALS — BP 144/62 | HR 72 | Ht 60.0 in | Wt 151.2 lb

## 2022-07-10 DIAGNOSIS — I5032 Chronic diastolic (congestive) heart failure: Secondary | ICD-10-CM

## 2022-07-10 DIAGNOSIS — I10 Essential (primary) hypertension: Secondary | ICD-10-CM

## 2022-07-10 DIAGNOSIS — I2510 Atherosclerotic heart disease of native coronary artery without angina pectoris: Secondary | ICD-10-CM | POA: Diagnosis not present

## 2022-07-10 DIAGNOSIS — I480 Paroxysmal atrial fibrillation: Secondary | ICD-10-CM | POA: Diagnosis not present

## 2022-07-10 DIAGNOSIS — Z7901 Long term (current) use of anticoagulants: Secondary | ICD-10-CM | POA: Diagnosis not present

## 2022-07-10 DIAGNOSIS — I25118 Atherosclerotic heart disease of native coronary artery with other forms of angina pectoris: Secondary | ICD-10-CM

## 2022-07-10 DIAGNOSIS — Z952 Presence of prosthetic heart valve: Secondary | ICD-10-CM | POA: Diagnosis not present

## 2022-07-10 MED ORDER — FUROSEMIDE 40 MG PO TABS: 40.0000 mg | ORAL_TABLET | Freq: Two times a day (BID) | ORAL | 3 refills | Status: DC | PRN

## 2022-07-10 NOTE — Patient Instructions (Signed)
Medication Instructions:  Your physician has recommended you make the following change in your medication:  Change furosemide to 40 mg twice daily as needed.   *If you need a refill on your cardiac medications before your next appointment, please call your pharmacy*   Lab Work: none If you have labs (blood work) drawn today and your tests are completely normal, you will receive your results only by: [place] (if you have MyChart) OR A paper copy in the mail If you have any lab test that is abnormal or we need to change your treatment, we will call you to review the results.   Testing/Procedures: none   Follow-Up: At [facility], you and your health needs are our priority.  As part of our continuing mission to provide you with exceptional heart care, we have created designated Provider Care Teams.  These Care Teams include your primary Cardiologist (physician) and Advanced Practice Providers (APPs -  Physician Assistants and Nurse Practitioners) who all work together to provide you with the care you need, when you need it.  We recommend signing up for the patient portal called "MyChart".  Sign up information is provided on this After Visit Summary.  MyChart is used to connect with patients for Virtual Visits (Telemedicine).  Patients are able to view lab/test results, encounter notes, upcoming appointments, etc.  Non-urgent messages can be sent to your provider as well.   To learn more about what you can do with MyChart, go to [email or web address].    Your next appointment:   6 month(s)  The format for your next appointment:   In Person  Provider:   Larae Grooms, MD     Other Instructions    Important Information About Sugar

## 2022-07-18 ENCOUNTER — Ambulatory Visit: Payer: Medicare Other | Attending: Cardiology | Admitting: *Deleted

## 2022-07-18 DIAGNOSIS — Z7901 Long term (current) use of anticoagulants: Secondary | ICD-10-CM

## 2022-07-18 DIAGNOSIS — Z5181 Encounter for therapeutic drug level monitoring: Secondary | ICD-10-CM | POA: Diagnosis not present

## 2022-07-18 DIAGNOSIS — I480 Paroxysmal atrial fibrillation: Secondary | ICD-10-CM | POA: Diagnosis not present

## 2022-07-18 DIAGNOSIS — Z952 Presence of prosthetic heart valve: Secondary | ICD-10-CM

## 2022-07-18 LAB — POCT INR: INR: 2.5 (ref 2.0–3.0)

## 2022-07-18 NOTE — Patient Instructions (Signed)
Description   Continue taking warfarin 1/2 tablet (2.'5mg'$ ) daily. Recheck INR in 3 weeks. Call our office if you have any problems or concerns [phone].

## 2022-07-23 DIAGNOSIS — E113293 Type 2 diabetes mellitus with mild nonproliferative diabetic retinopathy without macular edema, bilateral: Secondary | ICD-10-CM | POA: Diagnosis not present

## 2022-07-23 DIAGNOSIS — H353112 Nonexudative age-related macular degeneration, right eye, intermediate dry stage: Secondary | ICD-10-CM | POA: Diagnosis not present

## 2022-07-23 DIAGNOSIS — H35371 Puckering of macula, right eye: Secondary | ICD-10-CM | POA: Diagnosis not present

## 2022-07-23 DIAGNOSIS — H353221 Exudative age-related macular degeneration, left eye, with active choroidal neovascularization: Secondary | ICD-10-CM | POA: Diagnosis not present

## 2022-08-04 DIAGNOSIS — M25562 Pain in left knee: Secondary | ICD-10-CM | POA: Diagnosis not present

## 2022-08-08 ENCOUNTER — Ambulatory Visit: Payer: Medicare Other | Attending: Cardiology

## 2022-08-08 DIAGNOSIS — I480 Paroxysmal atrial fibrillation: Secondary | ICD-10-CM | POA: Diagnosis not present

## 2022-08-08 DIAGNOSIS — Z952 Presence of prosthetic heart valve: Secondary | ICD-10-CM

## 2022-08-08 DIAGNOSIS — Z5181 Encounter for therapeutic drug level monitoring: Secondary | ICD-10-CM | POA: Diagnosis not present

## 2022-08-08 DIAGNOSIS — Z7901 Long term (current) use of anticoagulants: Secondary | ICD-10-CM | POA: Diagnosis not present

## 2022-08-08 LAB — POCT INR: INR: 2.9 (ref 2.0–3.0)

## 2022-08-08 NOTE — Patient Instructions (Signed)
HOLD TONIGHT ONLY then Continue taking warfarin 1/2 tablet (2.'5mg'$ ) daily. Recheck INR in 3 weeks. Call our office if you have any problems or concerns [phone].

## 2022-08-11 DIAGNOSIS — M1712 Unilateral primary osteoarthritis, left knee: Secondary | ICD-10-CM | POA: Diagnosis not present

## 2022-08-11 DIAGNOSIS — M25562 Pain in left knee: Secondary | ICD-10-CM | POA: Diagnosis not present

## 2022-08-11 DIAGNOSIS — M170 Bilateral primary osteoarthritis of knee: Secondary | ICD-10-CM | POA: Diagnosis not present

## 2022-08-11 DIAGNOSIS — M1711 Unilateral primary osteoarthritis, right knee: Secondary | ICD-10-CM | POA: Diagnosis not present

## 2022-08-20 DIAGNOSIS — M1712 Unilateral primary osteoarthritis, left knee: Secondary | ICD-10-CM | POA: Diagnosis not present

## 2022-08-29 ENCOUNTER — Ambulatory Visit: Payer: Medicare Other | Attending: Interventional Cardiology

## 2022-08-29 DIAGNOSIS — Z952 Presence of prosthetic heart valve: Secondary | ICD-10-CM | POA: Diagnosis not present

## 2022-08-29 DIAGNOSIS — Z7901 Long term (current) use of anticoagulants: Secondary | ICD-10-CM

## 2022-08-29 DIAGNOSIS — I480 Paroxysmal atrial fibrillation: Secondary | ICD-10-CM

## 2022-08-29 DIAGNOSIS — Z5181 Encounter for therapeutic drug level monitoring: Secondary | ICD-10-CM

## 2022-08-29 LAB — POCT INR: INR: 3.7 — AB (ref 2.0–3.0)

## 2022-08-29 NOTE — Patient Instructions (Signed)
HOLD TONIGHT ONLY then DECREASE TO 1/2 tablet (2.'5mg'$ ) daily, except none on Monday and Friday. Recheck INR in 2 weeks. Call our office if you have any problems or concerns [phone].

## 2022-09-03 DIAGNOSIS — M25562 Pain in left knee: Secondary | ICD-10-CM | POA: Diagnosis not present

## 2022-09-12 ENCOUNTER — Ambulatory Visit: Payer: Medicare Other | Attending: Interventional Cardiology | Admitting: *Deleted

## 2022-09-12 DIAGNOSIS — Z7901 Long term (current) use of anticoagulants: Secondary | ICD-10-CM

## 2022-09-12 DIAGNOSIS — Z952 Presence of prosthetic heart valve: Secondary | ICD-10-CM | POA: Diagnosis not present

## 2022-09-12 DIAGNOSIS — I480 Paroxysmal atrial fibrillation: Secondary | ICD-10-CM

## 2022-09-12 DIAGNOSIS — Z5181 Encounter for therapeutic drug level monitoring: Secondary | ICD-10-CM | POA: Diagnosis not present

## 2022-09-12 LAB — POCT INR: INR: 2.2 (ref 2.0–3.0)

## 2022-09-12 NOTE — Patient Instructions (Signed)
Description   Continue taking warfarin 1/2 tablet (2.'5mg'$ ) daily, except none on Monday and Friday. Recheck INR in 3 weeks. Call our office if you have any problems or concerns [phone].

## 2022-09-24 DIAGNOSIS — E113293 Type 2 diabetes mellitus with mild nonproliferative diabetic retinopathy without macular edema, bilateral: Secondary | ICD-10-CM | POA: Diagnosis not present

## 2022-09-24 DIAGNOSIS — H43813 Vitreous degeneration, bilateral: Secondary | ICD-10-CM | POA: Diagnosis not present

## 2022-09-24 DIAGNOSIS — H35371 Puckering of macula, right eye: Secondary | ICD-10-CM | POA: Diagnosis not present

## 2022-09-24 DIAGNOSIS — H353110 Nonexudative age-related macular degeneration, right eye, stage unspecified: Secondary | ICD-10-CM | POA: Diagnosis not present

## 2022-09-24 DIAGNOSIS — H353220 Exudative age-related macular degeneration, left eye, stage unspecified: Secondary | ICD-10-CM | POA: Diagnosis not present

## 2022-10-03 ENCOUNTER — Ambulatory Visit: Payer: Medicare Other | Attending: Interventional Cardiology

## 2022-10-03 DIAGNOSIS — G894 Chronic pain syndrome: Secondary | ICD-10-CM | POA: Diagnosis not present

## 2022-10-03 DIAGNOSIS — Z5181 Encounter for therapeutic drug level monitoring: Secondary | ICD-10-CM

## 2022-10-03 DIAGNOSIS — Z7901 Long term (current) use of anticoagulants: Secondary | ICD-10-CM | POA: Diagnosis not present

## 2022-10-03 DIAGNOSIS — I480 Paroxysmal atrial fibrillation: Secondary | ICD-10-CM | POA: Diagnosis not present

## 2022-10-03 DIAGNOSIS — M25562 Pain in left knee: Secondary | ICD-10-CM | POA: Diagnosis not present

## 2022-10-03 DIAGNOSIS — Z952 Presence of prosthetic heart valve: Secondary | ICD-10-CM

## 2022-10-03 LAB — POCT INR: INR: 2.9 (ref 2.0–3.0)

## 2022-10-03 MED ORDER — WARFARIN SODIUM 2.5 MG PO TABS: ORAL_TABLET | ORAL | 1 refills | Status: DC

## 2022-10-03 NOTE — Patient Instructions (Signed)
Description   HOLD today's dose and eat greens and then START taking 1/2 tablet (1.'25mg'$ ) EXCEPT 1 tablet (2.'5mg'$ ) on Tuesdays and Thursdays.  Recheck INR in 2 weeks.  Call our office if you have any problems or concerns [phone]

## 2022-10-04 ENCOUNTER — Emergency Department (HOSPITAL_COMMUNITY): Payer: Medicare Other

## 2022-10-04 ENCOUNTER — Encounter (HOSPITAL_COMMUNITY): Payer: Self-pay

## 2022-10-04 ENCOUNTER — Inpatient Hospital Stay (HOSPITAL_COMMUNITY)
Admission: EM | Admit: 2022-10-04 | Discharge: 2022-10-09 | DRG: 602 | Disposition: A | Discharge status: Home Health | Payer: Medicare Other | Attending: Internal Medicine | Admitting: Internal Medicine

## 2022-10-04 DIAGNOSIS — Z8249 Family history of ischemic heart disease and other diseases of the circulatory system: Secondary | ICD-10-CM | POA: Diagnosis not present

## 2022-10-04 DIAGNOSIS — I130 Hypertensive heart and chronic kidney disease with heart failure and stage 1 through stage 4 chronic kidney disease, or unspecified chronic kidney disease: Secondary | ICD-10-CM | POA: Diagnosis present

## 2022-10-04 DIAGNOSIS — I2510 Atherosclerotic heart disease of native coronary artery without angina pectoris: Secondary | ICD-10-CM | POA: Diagnosis present

## 2022-10-04 DIAGNOSIS — R609 Edema, unspecified: Secondary | ICD-10-CM | POA: Diagnosis not present

## 2022-10-04 DIAGNOSIS — L0390 Cellulitis, unspecified: Secondary | ICD-10-CM | POA: Diagnosis present

## 2022-10-04 DIAGNOSIS — N1832 Chronic kidney disease, stage 3b: Secondary | ICD-10-CM | POA: Diagnosis not present

## 2022-10-04 DIAGNOSIS — I5032 Chronic diastolic (congestive) heart failure: Secondary | ICD-10-CM | POA: Diagnosis present

## 2022-10-04 DIAGNOSIS — R600 Localized edema: Secondary | ICD-10-CM | POA: Diagnosis not present

## 2022-10-04 DIAGNOSIS — E785 Hyperlipidemia, unspecified: Secondary | ICD-10-CM | POA: Diagnosis not present

## 2022-10-04 DIAGNOSIS — D62 Acute posthemorrhagic anemia: Secondary | ICD-10-CM | POA: Diagnosis not present

## 2022-10-04 DIAGNOSIS — Z888 Allergy status to other drugs, medicaments and biological substances status: Secondary | ICD-10-CM | POA: Diagnosis not present

## 2022-10-04 DIAGNOSIS — Z7901 Long term (current) use of anticoagulants: Secondary | ICD-10-CM | POA: Diagnosis not present

## 2022-10-04 DIAGNOSIS — M7989 Other specified soft tissue disorders: Secondary | ICD-10-CM | POA: Diagnosis not present

## 2022-10-04 DIAGNOSIS — R079 Chest pain, unspecified: Secondary | ICD-10-CM | POA: Diagnosis not present

## 2022-10-04 DIAGNOSIS — U071 COVID-19: Secondary | ICD-10-CM | POA: Diagnosis not present

## 2022-10-04 DIAGNOSIS — I252 Old myocardial infarction: Secondary | ICD-10-CM | POA: Diagnosis not present

## 2022-10-04 DIAGNOSIS — Z9049 Acquired absence of other specified parts of digestive tract: Secondary | ICD-10-CM | POA: Diagnosis not present

## 2022-10-04 DIAGNOSIS — H3530 Unspecified macular degeneration: Secondary | ICD-10-CM | POA: Diagnosis not present

## 2022-10-04 DIAGNOSIS — D649 Anemia, unspecified: Secondary | ICD-10-CM | POA: Diagnosis not present

## 2022-10-04 DIAGNOSIS — N200 Calculus of kidney: Secondary | ICD-10-CM | POA: Diagnosis not present

## 2022-10-04 DIAGNOSIS — W19XXXA Unspecified fall, initial encounter: Secondary | ICD-10-CM | POA: Diagnosis not present

## 2022-10-04 DIAGNOSIS — Z880 Allergy status to penicillin: Secondary | ICD-10-CM | POA: Diagnosis not present

## 2022-10-04 DIAGNOSIS — R509 Fever, unspecified: Secondary | ICD-10-CM | POA: Diagnosis not present

## 2022-10-04 DIAGNOSIS — I480 Paroxysmal atrial fibrillation: Secondary | ICD-10-CM | POA: Diagnosis not present

## 2022-10-04 DIAGNOSIS — Z79899 Other long term (current) drug therapy: Secondary | ICD-10-CM

## 2022-10-04 DIAGNOSIS — M109 Gout, unspecified: Secondary | ICD-10-CM | POA: Diagnosis present

## 2022-10-04 DIAGNOSIS — Z951 Presence of aortocoronary bypass graft: Secondary | ICD-10-CM

## 2022-10-04 DIAGNOSIS — I10 Essential (primary) hypertension: Secondary | ICD-10-CM | POA: Diagnosis not present

## 2022-10-04 DIAGNOSIS — E1122 Type 2 diabetes mellitus with diabetic chronic kidney disease: Secondary | ICD-10-CM | POA: Diagnosis not present

## 2022-10-04 DIAGNOSIS — S0990XA Unspecified injury of head, initial encounter: Secondary | ICD-10-CM | POA: Diagnosis not present

## 2022-10-04 DIAGNOSIS — Z952 Presence of prosthetic heart valve: Secondary | ICD-10-CM

## 2022-10-04 DIAGNOSIS — M85861 Other specified disorders of bone density and structure, right lower leg: Secondary | ICD-10-CM | POA: Diagnosis not present

## 2022-10-04 DIAGNOSIS — L03115 Cellulitis of right lower limb: Principal | ICD-10-CM | POA: Diagnosis present

## 2022-10-04 DIAGNOSIS — S199XXA Unspecified injury of neck, initial encounter: Secondary | ICD-10-CM | POA: Diagnosis not present

## 2022-10-04 DIAGNOSIS — I6523 Occlusion and stenosis of bilateral carotid arteries: Secondary | ICD-10-CM | POA: Diagnosis not present

## 2022-10-04 DIAGNOSIS — L089 Local infection of the skin and subcutaneous tissue, unspecified: Secondary | ICD-10-CM | POA: Diagnosis not present

## 2022-10-04 DIAGNOSIS — I499 Cardiac arrhythmia, unspecified: Secondary | ICD-10-CM | POA: Diagnosis not present

## 2022-10-04 DIAGNOSIS — R0602 Shortness of breath: Secondary | ICD-10-CM | POA: Diagnosis not present

## 2022-10-04 DIAGNOSIS — I672 Cerebral atherosclerosis: Secondary | ICD-10-CM | POA: Diagnosis not present

## 2022-10-04 LAB — CBC WITH DIFFERENTIAL/PLATELET
Abs Immature Granulocytes: 0 10*3/uL (ref 0.00–0.07)
Basophils Absolute: 0 10*3/uL (ref 0.0–0.1)
Basophils Relative: 0 %
Eosinophils Absolute: 0 10*3/uL (ref 0.0–0.5)
Eosinophils Relative: 0 %
HCT: 25.3 % — ABNORMAL LOW (ref 36.0–46.0)
Hemoglobin: 7.3 g/dL — ABNORMAL LOW (ref 12.0–15.0)
Lymphocytes Relative: 13 %
Lymphs Abs: 0.6 10*3/uL — ABNORMAL LOW (ref 0.7–4.0)
MCH: 31.6 pg (ref 26.0–34.0)
MCHC: 28.9 g/dL — ABNORMAL LOW (ref 30.0–36.0)
MCV: 109.5 fL — ABNORMAL HIGH (ref 80.0–100.0)
Monocytes Absolute: 0 10*3/uL — ABNORMAL LOW (ref 0.1–1.0)
Monocytes Relative: 1 %
Neutro Abs: 3.9 10*3/uL (ref 1.7–7.7)
Neutrophils Relative %: 85 %
Platelets: 228 10*3/uL (ref 150–400)
Promyelocytes Relative: 1 %
RBC: 2.31 MIL/uL — ABNORMAL LOW (ref 3.87–5.11)
RDW: 15.8 % — ABNORMAL HIGH (ref 11.5–15.5)
WBC: 4.6 10*3/uL (ref 4.0–10.5)
nRBC: 0 % (ref 0.0–0.2)
nRBC: 1 /100 WBC — ABNORMAL HIGH

## 2022-10-04 LAB — COMPREHENSIVE METABOLIC PANEL
ALT: 51 U/L — ABNORMAL HIGH (ref 0–44)
AST: 69 U/L — ABNORMAL HIGH (ref 15–41)
Albumin: 3.3 g/dL — ABNORMAL LOW (ref 3.5–5.0)
Alkaline Phosphatase: 45 U/L (ref 38–126)
Anion gap: 11 (ref 5–15)
BUN: 29 mg/dL — ABNORMAL HIGH (ref 8–23)
CO2: 21 mmol/L — ABNORMAL LOW (ref 22–32)
Calcium: 8.4 mg/dL — ABNORMAL LOW (ref 8.9–10.3)
Chloride: 102 mmol/L (ref 98–111)
Creatinine, Ser: 1.68 mg/dL — ABNORMAL HIGH (ref 0.44–1.00)
GFR, Estimated: 29 mL/min — ABNORMAL LOW (ref >60)
Glucose, Bld: 128 mg/dL — ABNORMAL HIGH (ref 70–99)
Potassium: 4 mmol/L (ref 3.5–5.1)
Sodium: 134 mmol/L — ABNORMAL LOW (ref 135–145)
Total Bilirubin: 0.9 mg/dL (ref 0.3–1.2)
Total Protein: 6 g/dL — ABNORMAL LOW (ref 6.5–8.1)

## 2022-10-04 LAB — LACTIC ACID, PLASMA
Lactic Acid, Venous: 2 mmol/L (ref 0.5–1.9)
Lactic Acid, Venous: 1.6 mmol/L (ref 0.5–1.9)

## 2022-10-04 LAB — BRAIN NATRIURETIC PEPTIDE: B Natriuretic Peptide: 236.3 pg/mL — ABNORMAL HIGH (ref 0.0–100.0)

## 2022-10-04 LAB — TROPONIN I (HIGH SENSITIVITY)
Troponin I (High Sensitivity): 13 ng/L (ref <18)
Troponin I (High Sensitivity): 13 ng/L (ref <18)

## 2022-10-04 NOTE — ED Provider Triage Note (Signed)
Emergency Medicine Provider Triage Evaluation Note  [name] , a 89 y.o. female  was evaluated in triage.  Pt complains of erythematous and painful right lower leg.  She reports has been like this for the past few days.  She mentioned having some chest pain and shortness of breath of the past few hours as well.  No recent cough or cold symptoms.  She denies any urinary pain.  Review of Systems  Positive:  Negative:   Physical Exam  BP (!) 152/132 (BP Location: Right Arm)   Pulse 94   Temp 98.1 F (36.7 C) (Oral)   Resp 16   Ht 5' (1.524 m)   Wt 68.6 kg   SpO2 99%   BMI 29.54 kg/m  Gen:   Awake, no distress, there is a strong smell of urine in the room.  Will order urinalysis for UTI. Resp:  Normal effort, [place] heard bilaterally MSK:   Moves extremities without difficulty-significant erythema and swelling noted to the right lower leg consistent with cellulitis.  She does have 2+ doughy pitting edema bilaterally. Other:    Medical Decision Making  Medically screening exam initiated at 7:10 PM.  Appropriate orders placed.  [name] was informed that the remainder of the evaluation will be completed by another provider, this initial triage assessment does not replace that evaluation, and the importance of remaining in the ED until their evaluation is complete.  Concern for cellulitis and CHF exacerbation.  Labs ordered.   Sherrell Puller, [place] 10/04/22 1912

## 2022-10-04 NOTE — ED Triage Notes (Addendum)
Pt arrived via [facility] from home for RLE infection. Per EMS theres redness and swelling and hot to touch started today. Pt's right lower leg has 4+ edema, is erythematous, hot to touch, 2+ right pedal pulse, cap refill greater than 3 sec.

## 2022-10-05 ENCOUNTER — Inpatient Hospital Stay (HOSPITAL_COMMUNITY): Payer: Medicare Other

## 2022-10-05 ENCOUNTER — Emergency Department (HOSPITAL_COMMUNITY): Payer: Medicare Other

## 2022-10-05 ENCOUNTER — Other Ambulatory Visit (HOSPITAL_COMMUNITY): Payer: Medicare Other

## 2022-10-05 DIAGNOSIS — I6523 Occlusion and stenosis of bilateral carotid arteries: Secondary | ICD-10-CM | POA: Diagnosis not present

## 2022-10-05 DIAGNOSIS — L0390 Cellulitis, unspecified: Secondary | ICD-10-CM | POA: Diagnosis present

## 2022-10-05 DIAGNOSIS — L03115 Cellulitis of right lower limb: Secondary | ICD-10-CM

## 2022-10-05 DIAGNOSIS — I672 Cerebral atherosclerosis: Secondary | ICD-10-CM | POA: Diagnosis not present

## 2022-10-05 DIAGNOSIS — Z951 Presence of aortocoronary bypass graft: Secondary | ICD-10-CM | POA: Diagnosis not present

## 2022-10-05 DIAGNOSIS — N1832 Chronic kidney disease, stage 3b: Secondary | ICD-10-CM | POA: Diagnosis present

## 2022-10-05 DIAGNOSIS — D62 Acute posthemorrhagic anemia: Secondary | ICD-10-CM | POA: Diagnosis present

## 2022-10-05 DIAGNOSIS — I252 Old myocardial infarction: Secondary | ICD-10-CM | POA: Diagnosis not present

## 2022-10-05 DIAGNOSIS — R0602 Shortness of breath: Secondary | ICD-10-CM | POA: Diagnosis not present

## 2022-10-05 DIAGNOSIS — E1122 Type 2 diabetes mellitus with diabetic chronic kidney disease: Secondary | ICD-10-CM | POA: Diagnosis present

## 2022-10-05 DIAGNOSIS — E785 Hyperlipidemia, unspecified: Secondary | ICD-10-CM | POA: Diagnosis present

## 2022-10-05 DIAGNOSIS — M85861 Other specified disorders of bone density and structure, right lower leg: Secondary | ICD-10-CM | POA: Diagnosis not present

## 2022-10-05 DIAGNOSIS — Z79899 Other long term (current) drug therapy: Secondary | ICD-10-CM | POA: Diagnosis not present

## 2022-10-05 DIAGNOSIS — I480 Paroxysmal atrial fibrillation: Secondary | ICD-10-CM | POA: Diagnosis present

## 2022-10-05 DIAGNOSIS — M109 Gout, unspecified: Secondary | ICD-10-CM | POA: Diagnosis present

## 2022-10-05 DIAGNOSIS — I499 Cardiac arrhythmia, unspecified: Secondary | ICD-10-CM | POA: Diagnosis not present

## 2022-10-05 DIAGNOSIS — I130 Hypertensive heart and chronic kidney disease with heart failure and stage 1 through stage 4 chronic kidney disease, or unspecified chronic kidney disease: Secondary | ICD-10-CM | POA: Diagnosis present

## 2022-10-05 DIAGNOSIS — R600 Localized edema: Secondary | ICD-10-CM | POA: Diagnosis not present

## 2022-10-05 DIAGNOSIS — Z888 Allergy status to other drugs, medicaments and biological substances status: Secondary | ICD-10-CM | POA: Diagnosis not present

## 2022-10-05 DIAGNOSIS — R079 Chest pain, unspecified: Secondary | ICD-10-CM | POA: Diagnosis not present

## 2022-10-05 DIAGNOSIS — Z880 Allergy status to penicillin: Secondary | ICD-10-CM | POA: Diagnosis not present

## 2022-10-05 DIAGNOSIS — S199XXA Unspecified injury of neck, initial encounter: Secondary | ICD-10-CM | POA: Diagnosis not present

## 2022-10-05 DIAGNOSIS — Z8249 Family history of ischemic heart disease and other diseases of the circulatory system: Secondary | ICD-10-CM | POA: Diagnosis not present

## 2022-10-05 DIAGNOSIS — Z9049 Acquired absence of other specified parts of digestive tract: Secondary | ICD-10-CM | POA: Diagnosis not present

## 2022-10-05 DIAGNOSIS — I5032 Chronic diastolic (congestive) heart failure: Secondary | ICD-10-CM | POA: Diagnosis present

## 2022-10-05 DIAGNOSIS — N200 Calculus of kidney: Secondary | ICD-10-CM | POA: Diagnosis not present

## 2022-10-05 DIAGNOSIS — S0990XA Unspecified injury of head, initial encounter: Secondary | ICD-10-CM | POA: Diagnosis not present

## 2022-10-05 DIAGNOSIS — Z7901 Long term (current) use of anticoagulants: Secondary | ICD-10-CM | POA: Diagnosis not present

## 2022-10-05 DIAGNOSIS — W19XXXA Unspecified fall, initial encounter: Secondary | ICD-10-CM | POA: Diagnosis present

## 2022-10-05 DIAGNOSIS — R609 Edema, unspecified: Secondary | ICD-10-CM | POA: Diagnosis not present

## 2022-10-05 DIAGNOSIS — L089 Local infection of the skin and subcutaneous tissue, unspecified: Secondary | ICD-10-CM | POA: Diagnosis not present

## 2022-10-05 DIAGNOSIS — I2510 Atherosclerotic heart disease of native coronary artery without angina pectoris: Secondary | ICD-10-CM | POA: Diagnosis present

## 2022-10-05 DIAGNOSIS — H3530 Unspecified macular degeneration: Secondary | ICD-10-CM | POA: Diagnosis present

## 2022-10-05 DIAGNOSIS — M7989 Other specified soft tissue disorders: Secondary | ICD-10-CM | POA: Diagnosis not present

## 2022-10-05 DIAGNOSIS — Z952 Presence of prosthetic heart valve: Secondary | ICD-10-CM | POA: Diagnosis not present

## 2022-10-05 DIAGNOSIS — U071 COVID-19: Secondary | ICD-10-CM | POA: Diagnosis present

## 2022-10-05 DIAGNOSIS — I10 Essential (primary) hypertension: Secondary | ICD-10-CM | POA: Diagnosis not present

## 2022-10-05 LAB — GLUCOSE, CAPILLARY
Glucose-Capillary: 122 mg/dL — ABNORMAL HIGH (ref 70–99)
Glucose-Capillary: 128 mg/dL — ABNORMAL HIGH (ref 70–99)
Glucose-Capillary: 153 mg/dL — ABNORMAL HIGH (ref 70–99)

## 2022-10-05 LAB — CBC
HCT: 24.8 % — ABNORMAL LOW (ref 36.0–46.0)
Hemoglobin: 7.6 g/dL — ABNORMAL LOW (ref 12.0–15.0)
MCH: 31.9 pg (ref 26.0–34.0)
MCHC: 30.6 g/dL (ref 30.0–36.0)
MCV: 104.2 fL — ABNORMAL HIGH (ref 80.0–100.0)
Platelets: 202 10*3/uL (ref 150–400)
RBC: 2.38 MIL/uL — ABNORMAL LOW (ref 3.87–5.11)
RDW: 18.6 % — ABNORMAL HIGH (ref 11.5–15.5)
WBC: 8.8 10*3/uL (ref 4.0–10.5)
nRBC: 0.3 % — ABNORMAL HIGH (ref 0.0–0.2)

## 2022-10-05 LAB — COMPREHENSIVE METABOLIC PANEL
ALT: 311 U/L — ABNORMAL HIGH (ref 0–44)
AST: 546 U/L — ABNORMAL HIGH (ref 15–41)
Albumin: 2.7 g/dL — ABNORMAL LOW (ref 3.5–5.0)
Alkaline Phosphatase: 43 U/L (ref 38–126)
Anion gap: 13 (ref 5–15)
BUN: 28 mg/dL — ABNORMAL HIGH (ref 8–23)
CO2: 20 mmol/L — ABNORMAL LOW (ref 22–32)
Calcium: 8.1 mg/dL — ABNORMAL LOW (ref 8.9–10.3)
Chloride: 100 mmol/L (ref 98–111)
Creatinine, Ser: 1.68 mg/dL — ABNORMAL HIGH (ref 0.44–1.00)
GFR, Estimated: 29 mL/min — ABNORMAL LOW (ref >60)
Glucose, Bld: 135 mg/dL — ABNORMAL HIGH (ref 70–99)
Potassium: 3.6 mmol/L (ref 3.5–5.1)
Sodium: 133 mmol/L — ABNORMAL LOW (ref 135–145)
Total Bilirubin: 1.8 mg/dL — ABNORMAL HIGH (ref 0.3–1.2)
Total Protein: 5.4 g/dL — ABNORMAL LOW (ref 6.5–8.1)

## 2022-10-05 LAB — PROTIME-INR
INR: 2.4 — ABNORMAL HIGH (ref 0.8–1.2)
Prothrombin Time: 25.9 seconds — ABNORMAL HIGH (ref 11.4–15.2)

## 2022-10-05 LAB — LACTATE DEHYDROGENASE: LDH: 654 U/L — ABNORMAL HIGH (ref 98–192)

## 2022-10-05 LAB — IRON AND TIBC
Iron: 27 ug/dL — ABNORMAL LOW (ref 28–170)
Saturation Ratios: 7 % — ABNORMAL LOW (ref 10.4–31.8)
TIBC: 365 ug/dL (ref 250–450)
UIBC: 338 ug/dL

## 2022-10-05 LAB — RESP PANEL BY RT-PCR (RSV, FLU A&B, COVID)  RVPGX2
Influenza A by PCR: NEGATIVE
Influenza B by PCR: NEGATIVE
Resp Syncytial Virus by PCR: NEGATIVE
SARS Coronavirus 2 by RT PCR: POSITIVE — AB

## 2022-10-05 LAB — PREPARE RBC (CROSSMATCH)

## 2022-10-05 LAB — PHOSPHORUS: Phosphorus: 3.7 mg/dL (ref 2.5–4.6)

## 2022-10-05 LAB — D-DIMER, QUANTITATIVE: D-Dimer, Quant: <0.27 ug/mL-FEU (ref 0.00–0.50)

## 2022-10-05 LAB — FERRITIN: Ferritin: 96 ng/mL (ref 11–307)

## 2022-10-05 LAB — C-REACTIVE PROTEIN: CRP: 11.8 mg/dL — ABNORMAL HIGH (ref <1.0)

## 2022-10-05 LAB — MAGNESIUM: Magnesium: 2.1 mg/dL (ref 1.7–2.4)

## 2022-10-05 MED ORDER — VANCOMYCIN HCL IN DEXTROSE 1-5 GM/200ML-% IV SOLN
1000.0000 mg | Freq: Once | INTRAVENOUS | Status: AC
  Administered 2022-10-05: 1000 mg via INTRAVENOUS
  Filled 2022-10-05: qty 200

## 2022-10-05 MED ORDER — ZINC SULFATE 220 (50 ZN) MG PO CAPS
220.0000 mg | ORAL_CAPSULE | Freq: Every day | ORAL | Status: DC
  Administered 2022-10-05 – 2022-10-09 (×5): 220 mg via ORAL
  Filled 2022-10-05 (×5): qty 1

## 2022-10-05 MED ORDER — SE-TAN PLUS 162-115.2-1 MG PO CAPS: 1.0000 | ORAL_CAPSULE | Freq: Two times a day (BID) | ORAL | Status: DC

## 2022-10-05 MED ORDER — OXYCODONE HCL 5 MG PO TABS
5.0000 mg | ORAL_TABLET | Freq: Four times a day (QID) | ORAL | Status: DC | PRN
  Administered 2022-10-07 (×2): 5 mg via ORAL
  Filled 2022-10-05 (×2): qty 1

## 2022-10-05 MED ORDER — FUROSEMIDE 10 MG/ML IJ SOLN
40.0000 mg | Freq: Once | INTRAMUSCULAR | Status: AC
  Administered 2022-10-05: 40 mg via INTRAVENOUS
  Filled 2022-10-05: qty 4

## 2022-10-05 MED ORDER — TRAZODONE HCL 50 MG PO TABS
50.0000 mg | ORAL_TABLET | Freq: Every evening | ORAL | Status: DC | PRN
  Administered 2022-10-05 – 2022-10-08 (×3): 50 mg via ORAL
  Filled 2022-10-05 (×3): qty 1

## 2022-10-05 MED ORDER — SODIUM CHLORIDE 0.9 % IV SOLN
200.0000 mg | Freq: Once | INTRAVENOUS | Status: AC
  Administered 2022-10-05: 200 mg via INTRAVENOUS
  Filled 2022-10-05: qty 40

## 2022-10-05 MED ORDER — DOCUSATE SODIUM 100 MG PO CAPS
100.0000 mg | ORAL_CAPSULE | Freq: Two times a day (BID) | ORAL | Status: DC
  Administered 2022-10-05 – 2022-10-09 (×8): 100 mg via ORAL
  Filled 2022-10-05 (×9): qty 1

## 2022-10-05 MED ORDER — FE FUM-VIT C-VIT B12-FA 460-60-0.01-1 MG PO CAPS
1.0000 | ORAL_CAPSULE | Freq: Two times a day (BID) | ORAL | Status: DC
  Administered 2022-10-05 – 2022-10-09 (×8): 1 via ORAL
  Filled 2022-10-05 (×9): qty 1

## 2022-10-05 MED ORDER — VANCOMYCIN HCL 1250 MG/250ML IV SOLN
1250.0000 mg | INTRAVENOUS | Status: DC
  Administered 2022-10-06: 1250 mg via INTRAVENOUS
  Filled 2022-10-05 (×2): qty 250

## 2022-10-05 MED ORDER — POLYETHYLENE GLYCOL 3350 17 G PO PACK: 17.0000 g | PACK | Freq: Every day | ORAL | Status: DC | PRN

## 2022-10-05 MED ORDER — VITAMIN C 500 MG PO TABS
500.0000 mg | ORAL_TABLET | Freq: Every day | ORAL | Status: DC
  Administered 2022-10-05 – 2022-10-09 (×5): 500 mg via ORAL
  Filled 2022-10-05 (×5): qty 1

## 2022-10-05 MED ORDER — SODIUM CHLORIDE 0.9% IV SOLUTION: Freq: Once | INTRAVENOUS | Status: DC

## 2022-10-05 MED ORDER — SODIUM CHLORIDE 0.9 % IV SOLN
100.0000 mg | Freq: Every day | INTRAVENOUS | Status: DC
  Administered 2022-10-06: 100 mg via INTRAVENOUS
  Filled 2022-10-05 (×3): qty 20

## 2022-10-05 MED ORDER — GUAIFENESIN-DM 100-10 MG/5ML PO SYRP
5.0000 mL | ORAL_SOLUTION | ORAL | Status: DC | PRN
  Administered 2022-10-05 – 2022-10-07 (×2): 5 mL via ORAL
  Filled 2022-10-05 (×2): qty 5

## 2022-10-05 MED ORDER — INSULIN ASPART 100 UNIT/ML IJ SOLN
0.0000 [IU] | Freq: Three times a day (TID) | INTRAMUSCULAR | Status: DC
  Administered 2022-10-05 – 2022-10-08 (×5): 1 [IU] via SUBCUTANEOUS
  Administered 2022-10-08: 2 [IU] via SUBCUTANEOUS

## 2022-10-05 MED ORDER — ACETAMINOPHEN 500 MG PO TABS
500.0000 mg | ORAL_TABLET | Freq: Four times a day (QID) | ORAL | Status: DC | PRN
  Administered 2022-10-05 – 2022-10-07 (×2): 500 mg via ORAL
  Filled 2022-10-05 (×2): qty 1

## 2022-10-05 MED ORDER — ISOSORBIDE MONONITRATE ER 60 MG PO TB24
60.0000 mg | ORAL_TABLET | Freq: Every day | ORAL | Status: DC
  Administered 2022-10-05 – 2022-10-09 (×5): 60 mg via ORAL
  Filled 2022-10-05 (×5): qty 1

## 2022-10-05 MED ORDER — NIRMATRELVIR/RITONAVIR (PAXLOVID) TABLET (RENAL DOSING): 2.0000 | ORAL_TABLET | Freq: Two times a day (BID) | ORAL | Status: DC

## 2022-10-05 MED ORDER — WARFARIN - PHARMACIST DOSING INPATIENT: Freq: Every day | Status: DC

## 2022-10-05 MED ORDER — METOPROLOL TARTRATE 5 MG/5ML IV SOLN: 5.0000 mg | INTRAVENOUS | Status: DC | PRN

## 2022-10-05 MED ORDER — LOSARTAN POTASSIUM 50 MG PO TABS
50.0000 mg | ORAL_TABLET | Freq: Every day | ORAL | Status: DC
  Administered 2022-10-05 – 2022-10-09 (×4): 50 mg via ORAL
  Filled 2022-10-05 (×5): qty 1

## 2022-10-05 MED ORDER — PANTOPRAZOLE SODIUM 40 MG PO TBEC
40.0000 mg | DELAYED_RELEASE_TABLET | Freq: Two times a day (BID) | ORAL | Status: DC
  Administered 2022-10-05 – 2022-10-09 (×9): 40 mg via ORAL
  Filled 2022-10-05 (×9): qty 1

## 2022-10-05 MED ORDER — FOLIC ACID 1 MG PO TABS
1.0000 mg | ORAL_TABLET | Freq: Every evening | ORAL | Status: DC
  Administered 2022-10-05 – 2022-10-08 (×4): 1 mg via ORAL
  Filled 2022-10-05 (×4): qty 1

## 2022-10-05 MED ORDER — IPRATROPIUM-ALBUTEROL 20-100 MCG/ACT IN AERS
1.0000 | INHALATION_SPRAY | Freq: Four times a day (QID) | RESPIRATORY_TRACT | Status: DC
  Administered 2022-10-05 – 2022-10-06 (×5): 1 via RESPIRATORY_TRACT
  Filled 2022-10-05: qty 4

## 2022-10-05 MED ORDER — ONDANSETRON HCL 4 MG/2ML IJ SOLN: 4.0000 mg | Freq: Four times a day (QID) | INTRAMUSCULAR | Status: DC | PRN

## 2022-10-05 MED ORDER — ADULT MULTIVITAMIN W/MINERALS CH: 1.0000 | ORAL_TABLET | Freq: Every day | ORAL | Status: DC

## 2022-10-05 MED ORDER — IPRATROPIUM-ALBUTEROL 0.5-2.5 (3) MG/3ML IN SOLN: 3.0000 mL | RESPIRATORY_TRACT | Status: DC | PRN

## 2022-10-05 MED ORDER — ADULT MULTIVITAMIN W/MINERALS CH
1.0000 | ORAL_TABLET | Freq: Every day | ORAL | Status: DC
  Administered 2022-10-05 – 2022-10-09 (×5): 1 via ORAL
  Filled 2022-10-05 (×5): qty 1

## 2022-10-05 MED ORDER — VANCOMYCIN HCL IN DEXTROSE 1-5 GM/200ML-% IV SOLN: 1000.0000 mg | Freq: Once | INTRAVENOUS | Status: DC

## 2022-10-05 MED ORDER — SODIUM CHLORIDE 0.9 % IV SOLN
510.0000 mg | Freq: Once | INTRAVENOUS | Status: AC
  Administered 2022-10-05: 510 mg via INTRAVENOUS
  Filled 2022-10-05: qty 17

## 2022-10-05 MED ORDER — AMIODARONE HCL 200 MG PO TABS
200.0000 mg | ORAL_TABLET | Freq: Every day | ORAL | Status: DC
  Administered 2022-10-05 – 2022-10-09 (×5): 200 mg via ORAL
  Filled 2022-10-05 (×5): qty 1

## 2022-10-05 MED ORDER — HYDRALAZINE HCL 20 MG/ML IJ SOLN: 10.0000 mg | INTRAMUSCULAR | Status: DC | PRN

## 2022-10-05 MED ORDER — ATORVASTATIN CALCIUM 80 MG PO TABS
80.0000 mg | ORAL_TABLET | Freq: Every day | ORAL | Status: DC
  Administered 2022-10-05 – 2022-10-09 (×5): 80 mg via ORAL
  Filled 2022-10-05 (×5): qty 1

## 2022-10-05 MED ORDER — WARFARIN 1.25 MG HALF TABLET
1.2500 mg | ORAL_TABLET | Freq: Once | ORAL | Status: AC
  Administered 2022-10-05: 1.25 mg via ORAL
  Filled 2022-10-05: qty 1

## 2022-10-05 MED ORDER — PROCHLORPERAZINE EDISYLATE 10 MG/2ML IJ SOLN: 5.0000 mg | Freq: Four times a day (QID) | INTRAMUSCULAR | Status: DC | PRN

## 2022-10-05 MED ORDER — INSULIN ASPART 100 UNIT/ML IJ SOLN: 0.0000 [IU] | Freq: Every day | INTRAMUSCULAR | Status: DC

## 2022-10-05 MED ORDER — EZETIMIBE 10 MG PO TABS
10.0000 mg | ORAL_TABLET | Freq: Every day | ORAL | Status: DC
  Administered 2022-10-05 – 2022-10-09 (×5): 10 mg via ORAL
  Filled 2022-10-05 (×5): qty 1

## 2022-10-05 MED ORDER — PROSIGHT PO TABS
1.0000 | ORAL_TABLET | Freq: Every day | ORAL | Status: DC
  Administered 2022-10-05 – 2022-10-09 (×5): 1 via ORAL
  Filled 2022-10-05 (×6): qty 1

## 2022-10-05 NOTE — ED Notes (Signed)
Attempted IV x2, IV team consult in, MD aware of delay

## 2022-10-05 NOTE — Progress Notes (Signed)
ANTICOAGULATION CONSULT NOTE - Initial Consult  Pharmacy Consult for Coumadin Indication: mechanical valve  Allergies  Allergen Reactions   Darvon [Propoxyphene] Nausea And Vomiting   Lisinopril Cough   Septra [Sulfamethoxazole-Trimethoprim] Other (See Comments)    Increased INR   Warfarin And Related Other (See Comments)    ONLY TOLERATES BRAND   Trazodone And Nefazodone     Severe sweating   Zolpidem Tartrate     Hallucinations   Penicillins Rash    Did it involve swelling of the face/tongue/throat, SOB, or low BP? No Did it involve sudden or severe rash/hives, skin peeling, or any reaction on the inside of your mouth or nose? No Did you need to seek medical attention at a hospital or doctor's office? No When did it last happen?  15 years     If all above answers are "NO", may proceed with cephalosporin use.    Patient Measurements: Height: 5' (152.4 cm) Weight: 68.6 kg (151 lb 3.8 oz) IBW/kg (Calculated) : 45.5  Vital Signs: Temp: 98.6 F (37 C) (01/14 0503) Temp Source: Oral (01/14 0503) BP: 128/55 (01/14 0503) Pulse Rate: 92 (01/14 0503)  Labs: Recent Labs    10/03/22 1126 10/04/22 1908 10/04/22 2145 10/05/22 0315  HGB  --  7.3*  --   --   HCT  --  25.3*  --   --   PLT  --  228  --   --   LABPROT  --   --   --  25.9*  INR 2.9  --   --  2.4*  CREATININE  --  1.68*  --   --   TROPONINIHS  --  13 13  --     Estimated Creatinine Clearance: 20.4 mL/min (A) (by C-G formula based on SCr of 1.68 mg/dL (H)).   Medical History: Past Medical History:  Diagnosis Date   Anemia    Anginal pain ([place])    Arteriovenous malformation of gastrointestinal tract    Arthritis    "fingers mostly" (04/12/2015)   Carotid artery disease ([place])    Carotid US 3/17:  54-65% RICA; 68-12% LICA; Elevated bilateral subclavian artery velocities >>f/u 1 year. // Carotid US 4/18: R 40-59; L 1-39 >> FU 1 year // Carotid US 10/2018: R 40-59; L 1-39, L subclavian stenosis    CHF  (congestive heart failure) (HCC)    Chronic kidney disease (CKD), stage III (moderate) (HCC)    Chronic lower GI bleeding    "today; last time was ~ 8 yr ago; used to have them often before that too" (02/11/2013)   Coronary artery disease    DJD (degenerative joint disease)    Dysrhythmia    Heart murmur    History of blood transfusion    "a few times over the years; usually related to my Coumadin" (04/12/2015   History of gout    Hyperlipemia    Hypertension    Macula lutea degeneration    Old MI (myocardial infarction)    "a coulple /dr in 02/2008; I never even knew I'd had them" (04/12/2015)   Type II diabetes mellitus (HCC)    no medications, diet controlled    Medications:  No current facility-administered medications on file prior to encounter.   Current Outpatient Medications on File Prior to Encounter  Medication Sig Dispense Refill   amiodarone (PACERONE) 200 MG tablet Take 1 tablet by mouth once daily 90 tablet 2   atorvastatin (LIPITOR) 80 MG tablet Take 1 tablet by mouth  once daily 90 tablet 1   bevacizumab (AVASTIN) 1.25 mg/0.1 mL SOLN Place 1.25 mg into the left eye every 8 (eight) weeks.     docusate sodium (COLACE) 100 MG capsule Take 1 capsule (100 mg total) by mouth 2 (two) times daily. 30 capsule 0   ezetimibe (ZETIA) 10 MG tablet Take 1 tablet by mouth once daily 90 tablet 2   FeFum-FePo-FA-B Cmp-C-Zn-Mn-Cu (SE-TAN PLUS) 162-115.2-1 MG CAPS Take 1 capsule by mouth 2 (two) times daily.   3   folic acid (FOLVITE) 798 MCG tablet Take 400 mcg by mouth every evening.      furosemide (LASIX) 40 MG tablet Take 1 tablet (40 mg total) by mouth 2 (two) times daily as needed. 180 tablet 3   isosorbide mononitrate (IMDUR) 60 MG 24 hr tablet Take 1 tablet by mouth once daily 90 tablet 2   losartan (COZAAR) 50 MG tablet Take 50 mg by mouth daily.     Multiple Vitamin (MULTIVITAMIN WITH MINERALS) TABS Take 1 tablet by mouth daily.     Multiple Vitamins-Minerals (PRESERVISION  AREDS 2 PO) Take 1 tablet by mouth 2 (two) times daily.      nitroGLYCERIN (NITROSTAT) 0.4 MG SL tablet DISSOLVE ONE TABLET UNDER THE TONGUE EVERY 5 MINUTES AS NEEDED FOR CHEST PAIN.  DO NOT EXCEED A TOTAL OF 3 DOSES IN 15 MINUTES 25 tablet 5   Omega-3 Fatty Acids (FISH OIL) 600 MG CAPS Take 1,200 mg by mouth in the morning and at bedtime.     warfarin (JANTOVEN) 2.5 MG tablet TAKE 1/2 TABLET TO 1 TABLET BY MOUTH DAILY AS DIRECTED BY COUMADIN CLINIC 65 tablet 1     Assessment: 89 y.o. female admitted with LE cellulitis, h/o mechanical AVR, to continue Coumadin.  Current regimen Warfarin 1.25 mg daily except 2.5 mg Tue and Thu   Goal of Therapy:  INR 2-2.5 due to h/o GIB 01/2013 Monitor platelets by anticoagulation protocol: Yes   Plan:  Coumadin 1.25 mg tonight if no plans for surgical intervention Daily INR  Caryl Pina 10/05/2022,6:04 AM

## 2022-10-05 NOTE — Evaluation (Signed)
Physical Therapy Evaluation Patient Details Name: [name] MRN: [id number] DOB: 07-May-1933 Today's Date: 10/05/2022  History of Present Illness  [name] is a 89 y.o. female who presented to [facility] ED due to right leg pain, swelling, redness and warmth.  Endorses that she fell few days ago.  She is unsure if she hit her right leg.  She noticed the redness and the pain yesterday around 6 PM and it rapidly progressed; covid + as well; with medical history significant for coronary artery disease status post CABG, aortic valve disease status post mechanical AVR, paroxysmal A-fib, on Coumadin, hypertension, hyperlipidemia, type 2 diabetes, history of GI bleed, CKD 3B, chronic diastolic CHF,  Clinical Impression   Pt admitted with above diagnosis. Lives at home with husband, in a single-level home with 3 steps to enter; Prior to admission, pt was able to walk short distances with RW, history of falls, and had been sleeping in recliner; Needing more an dmore assist in the days leading up to this admission; Presents to PT with RLE pain, weakness, decr activity tolerance, and decr independence and safety with functional mobility; Today, needed mod assist to stand and transfer bed to [facility] and back; able to stand at RW long enough to help with hygeine;  Pt currently with functional limitations due to the deficits listed below (see PT Problem List). Pt will benefit from skilled PT to increase their independence and safety with mobility to allow discharge to the venue listed below.          Recommendations for follow up therapy are one component of a multi-disciplinary discharge planning process, led by the attending physician.  Recommendations may be updated based on patient status, additional functional criteria and insurance authorization.  Follow Up Recommendations Home health PT (SNF for post-acute rehab is indicated, however pt and husband choosing home over SNF)      Assistance Recommended at  Discharge Frequent or constant Supervision/Assistance  Patient can return home with the following  A lot of help with walking and/or transfers;A lot of help with bathing/dressing/bathroom;Assistance with cooking/housework;Assist for transportation;Help with stairs or ramp for entrance    Equipment Recommendations Other (comment) (recommend getting info re: getting a ramp for pt and husband)  Recommendations for Other Services  OT consult    Functional Status Assessment Patient has had a recent decline in their functional status and demonstrates the ability to make significant improvements in function in a reasonable and predictable amount of time.     Precautions / Restrictions Precautions Precautions: Fall Precaution Comments: covid Restrictions Weight Bearing Restrictions: No      Mobility  Bed Mobility Overal bed mobility: Needs Assistance Bed Mobility: Supine to Sit     Supine to sit: Min assist     General bed mobility comments: Min handheld assist to pull to sit; cues and min assist with bed pad to scoot fully to EOB    Transfers Overall transfer level: Needs assistance Equipment used: Rolling walker (2 wheels) Transfers: Sit to/from Stand, Bed to chair/wheelchair/BSC Sit to Stand: Mod assist   Step pivot transfers: Mod assist       General transfer comment: Heavy mod assist to rise; slow moving; able to take pivot steps bed to [facility] with mod assist to stabilize and move RW    Ambulation/Gait                  Stairs            Wheelchair Mobility  Modified Rankin (Stroke Patients Only)       Balance                                             Pertinent Vitals/Pain Pain Assessment Pain Assessment: Faces Faces Pain Scale: Hurts little more Pain Location: R lower leg Pain Descriptors / Indicators: Grimacing Pain Intervention(s): Monitored during session    Home Living Family/patient expects to be discharged to::  Private residence Living Arrangements: Spouse/significant other Available Help at Discharge: Family;Available 24 hours/day Type of Home: House Home Access: Stairs to enter Entrance Stairs-Rails: Right Entrance Stairs-Number of Steps: 3   Home Layout: One level Home Equipment: Rolling Walker (2 wheels);Grab bars - tub/shower;Shower seat;Cane - single point;Wheelchair - manual;Hand held shower head;Adaptive equipment      Prior Function Prior Level of Function : Needs assist             Mobility Comments: Has been sleeping in recliner; Difficulty getting to stand; very limited walking, esp in few days leading up to admission ADLs Comments: Tends to sink bathe wth assist; infrequent showers with husband assist     Hand Dominance   Dominant Hand: Right    Extremity/Trunk Assessment   Upper Extremity Assessment Upper Extremity Assessment: Defer to OT evaluation;Generalized weakness    Lower Extremity Assessment Lower Extremity Assessment: Generalized weakness;RLE deficits/detail RLE Deficits / Details: erythema and painful to palp lower leg RLE: Unable to fully assess due to pain       Communication   Communication: Receptive difficulties (difficulty hearing with mask/shields on)  Cognition Arousal/Alertness: Awake/alert Behavior During Therapy: WFL for tasks assessed/performed Overall Cognitive Status: History of cognitive impairments - at baseline                                 General Comments: Husband reports short-term memory deficits        General Comments General comments (skin integrity, edema, etc.): Large formed BM; session conducted on room air and O2 sats remained at or near 99-100%    Exercises     Assessment/Plan    PT Assessment Patient needs continued PT services  PT Problem List Decreased strength;Decreased range of motion;Decreased activity tolerance;Decreased balance;Decreased mobility;Decreased coordination;Decreased  cognition;Decreased knowledge of use of DME;Decreased safety awareness;Decreased knowledge of precautions;Pain;Decreased skin integrity       PT Treatment Interventions DME instruction;Gait training;Stair training;Functional mobility training;Therapeutic activities;Therapeutic exercise;Balance training;Cognitive remediation;Patient/family education    PT Goals (Current goals can be found in the Care Plan section)  Acute Rehab PT Goals Patient Stated Goal: leg to feel better PT Goal Formulation: With patient/family Time For Goal Achievement: 10/19/22 Potential to Achieve Goals: Good    Frequency Min 3X/week     Co-evaluation               AM-PAC PT "6 Clicks" Mobility  Outcome Measure Help needed turning from your back to your side while in a flat bed without using bedrails?: None Help needed moving from lying on your back to sitting on the side of a flat bed without using bedrails?: A Little Help needed moving to and from a bed to a chair (including a wheelchair)?: A Lot Help needed standing up from a chair using your arms (e.g., wheelchair or bedside chair)?: A Lot Help needed to walk in  hospital room?: Total Help needed climbing 3-5 steps with a railing? : Total 6 Click Score: 13    End of Session Equipment Utilized During Treatment: Gait belt Activity Tolerance: Patient tolerated treatment well Patient left: in chair;with call bell/phone within reach;with chair alarm set Nurse Communication: Mobility status PT Visit Diagnosis: Unsteadiness on feet (R26.81);Other abnormalities of gait and mobility (R26.89);Pain Pain - Right/Left: Right Pain - part of body: Leg    Time: 7741-2878 PT Time Calculation (min) (ACUTE ONLY): 48 min   Charges:   PT Evaluation $PT Eval Moderate Complexity: 1 Mod PT Treatments $Therapeutic Activity: 23-37 mins        Roney Marion, PT  Acute Rehabilitation Services Office [phone]   Colletta [place] 10/05/2022, 5:51 PM

## 2022-10-05 NOTE — Progress Notes (Signed)
Pharmacy Antibiotic Note  [name] is a 89 y.o. 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  Pharmacy has been consulted for vanco dosing.  Plan: Vanco 1 gram x 1 was given in the ED 10/05/2022 at 0355 Start vanco 1250 mg iv q48h on 10/06/2022 2300 eAUC 494 mcg*hr/mL using scr 1.68 mg/dL and Vd 0.72 L/kg (TBW) F/up on cultures, renal function, and opportunities to de-escalate  Height: 5' (152.4 cm) Weight: 68.6 kg (151 lb 3.8 oz) IBW/kg (Calculated) : 45.5  Temp (24hrs), Avg:98.3 F (36.8 C), Min:98.1 F (36.7 C), Max:98.6 F (37 C)  Recent Labs  Lab 10/04/22 1908 10/04/22 2145  WBC 4.6  --   CREATININE 1.68*  --   LATICACIDVEN 2.0* 1.6    Estimated Creatinine Clearance: 20.4 mL/min (A) (by C-G formula based on SCr of 1.68 mg/dL (H)).    Allergies  Allergen Reactions   Darvon [Propoxyphene] Nausea And Vomiting   Lisinopril Cough   Septra [Sulfamethoxazole-Trimethoprim] Other (See Comments)    Increased INR   Warfarin And Related Other (See Comments)    ONLY TOLERATES BRAND   Trazodone And Nefazodone     Severe sweating   Zolpidem Tartrate     Hallucinations   Penicillins Rash    Did it involve swelling of the face/tongue/throat, SOB, or low BP? No Did it involve sudden or severe rash/hives, skin peeling, or any reaction on the inside of your mouth or nose? No Did you need to seek medical attention at a hospital or doctor's office? No When did it last happen?  15 years     If all above answers are "NO", may proceed with cephalosporin use.    Antimicrobials this admission: Vanco 1/14 >>   Thank you for allowing pharmacy to be a part of this patient's care.  Vaughan Basta BS, PharmD, BCPS Clinical Pharmacist 10/05/2022 9:13 AM  Contact: [phone] after 3 PM  "Be curious, not judgmental..." -Jamal Maes

## 2022-10-05 NOTE — ED Notes (Signed)
Patient transported to CT 

## 2022-10-05 NOTE — ED Notes (Signed)
ED TO INPATIENT HANDOFF REPORT  ED Nurse Name and Phone #: Overton Mam [phone]  S Name/Age/Gender [name] 89 y.o. female Room/Bed: 034C/034C  Code Status   Code Status: Full Code  Home/SNF/Other Home Patient oriented to: self, place, time, and situation Is this baseline? Yes   Triage Complete: Triage complete  Chief Complaint Cellulitis [A12.87]  Triage Note Pt arrived via [facility] from home for RLE infection. Per EMS theres redness and swelling and hot to touch started today. Pt's right lower leg has 4+ edema, is erythematous, hot to touch, 2+ right pedal pulse, cap refill greater than 3 sec.    Allergies Allergies  Allergen Reactions   Darvon [Propoxyphene] Nausea And Vomiting   Lisinopril Cough   Septra [Sulfamethoxazole-Trimethoprim] Other (See Comments)    Increased INR   Warfarin And Related Other (See Comments)    ONLY TOLERATES BRAND   Trazodone And Nefazodone     Severe sweating   Zolpidem Tartrate     Hallucinations   Penicillins Rash    Did it involve swelling of the face/tongue/throat, SOB, or low BP? No Did it involve sudden or severe rash/hives, skin peeling, or any reaction on the inside of your mouth or nose? No Did you need to seek medical attention at a hospital or doctor's office? No When did it last happen?  15 years     If all above answers are "NO", may proceed with cephalosporin use.    Level of Care/Admitting Diagnosis ED Disposition     ED Disposition  Admit   Condition  --   [place]: [place] [[id number]]  Level of Care: Telemetry Medical [104]  May admit patient to [facility] or [facility] if equivalent level of care is available:: Yes  Covid Evaluation: Asymptomatic - no recent exposure (last 10 days) testing not required  Diagnosis: Cellulitis [[id number]]  Admitting Physician: Kayleen Memos [[id number]]  Attending Physician: Kayleen Memos [[id number]]  Certification:: I certify this patient  will need inpatient services for at least 2 midnights  Estimated Length of Stay: 2          B Medical/Surgery History Past Medical History:  Diagnosis Date   Anemia    Anginal pain ([place])    Arteriovenous malformation of gastrointestinal tract    Arthritis    "fingers mostly" (04/12/2015)   Carotid artery disease ([place])    Carotid US 3/17:  47-65% RICA; 46-50% LICA; Elevated bilateral subclavian artery velocities >>f/u 1 year. // Carotid US 4/18: R 40-59; L 1-39 >> FU 1 year // Carotid US 10/2018: R 40-59; L 1-39, L subclavian stenosis    CHF (congestive heart failure) (HCC)    Chronic kidney disease (CKD), stage III (moderate) (HCC)    Chronic lower GI bleeding    "today; last time was ~ 8 yr ago; used to have them often before that too" (02/11/2013)   Coronary artery disease    DJD (degenerative joint disease)    Dysrhythmia    Heart murmur    History of blood transfusion    "a few times over the years; usually related to my Coumadin" (04/12/2015   History of gout    Hyperlipemia    Hypertension    Macula lutea degeneration    Old MI (myocardial infarction)    "a coulple /dr in 02/2008; I never even knew I'd had them" (04/12/2015)   Type II diabetes mellitus ([place])    no medications, diet controlled  Past Surgical History:  Procedure Laterality Date   AORTIC VALVE REPLACEMENT  09/23/1991   St. Jude   APPENDECTOMY  09/23/1951   BIOPSY  11/06/2019   Procedure: BIOPSY;  Surgeon: Doran Stabler, MD;  Location[facility] ENDOSCOPY;  Service: Gastroenterology;;   CARDIAC CATHETERIZATION  ~1990   CARDIAC CATHETERIZATION  04/12/2015   Procedure: Coronary Stent Intervention;  Surgeon: Jettie Booze, MD;    SYNERGY DES 3.5X16 to the ostial RCA    CARDIAC CATHETERIZATION  04/12/2015   Procedure: Coronary/Graft Angiography;  Surgeon: Eloy End, MD; LAD & CFX 100%, patent LIMA-LAD, SVG-D1; SVG-OM-PDA first limb 100%, 2nd limb patent; oRCA 75%>0 w/ stent   CARDIAC VALVE  REPLACEMENT  09/22/1992   St. Jude/notes 10/29/2003 (02/11/2013)   CARDIOVERSION N/A 08/15/2019   Procedure: CARDIOVERSION;  Surgeon: Thayer Headings, MD;  Location: [facility] ENDOSCOPY;  Service: Cardiovascular;  Laterality: N/A;   COLONOSCOPY N/A 02/13/2013   Procedure: COLONOSCOPY;  Surgeon: Lafayette Dragon, MD;  Location: [facility] ENDOSCOPY;  Service: Endoscopy;  Laterality: N/A;   CORONARY ANGIOPLASTY WITH STENT PLACEMENT  2009+   "3 at least; put in 1 stent at a time" (02/11/2013)   CORONARY ARTERY BYPASS GRAFT  09/22/1988   LIMA-LAD, SVG-OM-PDA, SVG-D1   DILATION AND CURETTAGE OF UTERUS  05/24/1959   'after a miscarriage" (02/11/2013)   ENTEROSCOPY N/A 02/13/2013   Procedure: ENTEROSCOPY;  Surgeon: Lafayette Dragon, MD;  Location: [facility] ENDOSCOPY;  Service: Endoscopy;  Laterality: N/A;   ENTEROSCOPY N/A 11/06/2019   Procedure: ENTEROSCOPY;  Surgeon: Doran Stabler, MD;  Location: [place];  Service: Gastroenterology;  Laterality: N/A;   ESOPHAGOGASTRODUODENOSCOPY (EGD) WITH PROPOFOL N/A 12/01/2019   Procedure: ESOPHAGOGASTRODUODENOSCOPY (EGD) WITH PROPOFOL;  Surgeon: Mauri Pole, MD;  Location: [place] ENDOSCOPY;  Service: Endoscopy;  Laterality: N/A;   EXPLORATORY LAPAROTOMY  02/24/2008   which revealed a retroperitoneal hematoma and bleeding from the right external iliac artery/notes 03/02/2008 (02/11/2013)    FLEXIBLE SIGMOIDOSCOPY N/A 04/21/2015   Procedure: FLEXIBLE SIGMOIDOSCOPY;  Surgeon: Ladene Artist, MD;  Location: [facility] ENDOSCOPY;  Service: Endoscopy;  Laterality: N/A;   HOT HEMOSTASIS N/A 11/06/2019   Procedure: HOT HEMOSTASIS (ARGON PLASMA COAGULATION/BICAP);  Surgeon: Doran Stabler, MD;  Location: [place];  Service: Gastroenterology;  Laterality: N/A;   LUMBAR LAMINECTOMY/DECOMPRESSION MICRODISCECTOMY Left 06/02/2022   Procedure: LEFT LUMBAR FOUR-FIVE MICRODISCECTOMY;  Surgeon: Newman Pies, MD;  Location: [place];  Service: Neurosurgery;  Laterality: Left;  3C   TEE WITHOUT  CARDIOVERSION N/A 12/01/2018   Procedure: TRANSESOPHAGEAL ECHOCARDIOGRAM (TEE);  Surgeon: Jerline Pain, MD;  Location: [facility] ENDOSCOPY;  Service: Cardiovascular;  Laterality: N/A;     A IV Location/Drains/Wounds Patient Lines/Drains/Airways Status     Active Line/Drains/Airways     Name Placement date Placement time Site Days   Peripheral IV 03/09/22 20 G Anterior;Left;Proximal Forearm 03/09/22  0057  Forearm  210   Peripheral IV 10/05/22 20 G 1.88" Right Antecubital 10/05/22  0327  Antecubital  less than 1   Peripheral IV 10/05/22 22 G Right;Anterior Forearm 10/05/22  0329  Forearm  less than 1   Incision (Closed) 06/02/22 Back 06/02/22  1715  -- 125            Intake/Output Last 24 hours  Intake/Output Summary (Last 24 hours) at 10/05/2022 0759 Last data filed at 10/05/2022 0739 Gross per 24 hour  Intake 722 ml  Output --  Net 722 ml    Labs/Imaging Results for orders placed  or performed during the hospital encounter of 10/04/22 (from the past 48 hour(s))  CBC with Differential     Status: Abnormal   Collection Time: 10/04/22  7:08 PM  Result Value Ref [place]   WBC 4.6 4.0 - 10.5 K/uL   RBC 2.31 (L) 3.87 - 5.11 MIL/uL   Hemoglobin 7.3 (L) 12.0 - 15.0 g/dL   HCT 25.3 (L) 36.0 - 46.0 %   MCV 109.5 (H) 80.0 - 100.0 fL   MCH 31.6 26.0 - 34.0 pg   MCHC 28.9 (L) 30.0 - 36.0 g/dL   RDW 15.8 (H) 11.5 - 15.5 %   Platelets 228 150 - 400 K/uL   nRBC 0.0 0.0 - 0.2 %   Neutrophils Relative % 85 %   Neutro Abs 3.9 1.7 - 7.7 K/uL   Lymphocytes Relative 13 %   Lymphs Abs 0.6 (L) 0.7 - 4.0 K/uL   Monocytes Relative 1 %   Monocytes Absolute 0.0 (L) 0.1 - 1.0 K/uL   Eosinophils Relative 0 %   Eosinophils Absolute 0.0 0.0 - 0.5 K/uL   Basophils Relative 0 %   Basophils Absolute 0.0 0.0 - 0.1 K/uL   nRBC 1 (H) 0 /100 WBC   Promyelocytes Relative 1 %   Abs Immature Granulocytes 0.00 0.00 - 0.07 K/uL   Tear Drop Cells PRESENT    Polychromasia PRESENT     Comment: Performed at  [facility], [address] [place], [place] [address]  Comprehensive metabolic panel     Status: Abnormal   Collection Time: 10/04/22  7:08 PM  Result Value Ref [place]   Sodium 134 (L) 135 - 145 mmol/L   Potassium 4.0 3.5 - 5.1 mmol/L   Chloride 102 98 - 111 mmol/L   CO2 21 (L) 22 - 32 mmol/L   Glucose, Bld 128 (H) 70 - 99 mg/dL    Comment: Glucose reference range applies only to samples taken after fasting for at least 8 hours.   BUN 29 (H) 8 - 23 mg/dL   Creatinine, Ser 1.68 (H) 0.44 - 1.00 mg/dL   Calcium 8.4 (L) 8.9 - 10.3 mg/dL   Total Protein 6.0 (L) 6.5 - 8.1 g/dL   Albumin 3.3 (L) 3.5 - 5.0 g/dL   AST 69 (H) 15 - 41 U/L   ALT 51 (H) 0 - 44 U/L   Alkaline Phosphatase 45 38 - 126 U/L   Total Bilirubin 0.9 0.3 - 1.2 mg/dL   GFR, Estimated 29 (L) >60 mL/min    Comment: (NOTE) Calculated using the CKD-EPI Creatinine Equation (2021)    Anion gap 11 5 - 15    Comment: Performed at [facility], [place] [address], [place], [place] [address]  Lactic acid, plasma     Status: Abnormal   Collection Time: 10/04/22  7:08 PM  Result Value Ref [place]   Lactic Acid, Venous 2.0 (HH) 0.5 - 1.9 mmol/L    Comment: CRITICAL RESULT CALLED TO, READ BACK BY AND VERIFIED WITH Ellie Lunch RN 10/04/22 2124 AMIREHSANI F Performed at [facility], [place] [address], [place], [place] [address]   Troponin I (High Sensitivity)     Status: None   Collection Time: 10/04/22  7:08 PM  Result Value Ref [place]   Troponin I (High Sensitivity) 13 <18 ng/L    Comment: (NOTE) Elevated high sensitivity troponin I (hsTnI) values and significant  changes across serial measurements may suggest ACS but many other  chronic and acute  conditions are known to elevate hsTnI results.  Refer to the "Links" section for chest pain algorithms and additional  guidance. Performed at [facility], [place] [address], [place], [place] [address]   Brain natriuretic peptide     Status: Abnormal   Collection Time:  10/04/22  7:10 PM  Result Value Ref [place]   B Natriuretic Peptide 236.3 (H) 0.0 - 100.0 pg/mL    Comment: Performed at [facility] [address] [place], [place] [address]  Lactic acid, plasma     Status: None   Collection Time: 10/04/22  9:45 PM  Result Value Ref [place]   Lactic Acid, Venous 1.6 0.5 - 1.9 mmol/L    Comment: Performed at [place] [facility] [address] [place], [place] [address]  Troponin I (High Sensitivity)     Status: None   Collection Time: 10/04/22  9:45 PM  Result Value Ref [place]   Troponin I (High Sensitivity) 13 <18 ng/L    Comment: (NOTE) Elevated high sensitivity troponin I (hsTnI) values and significant  changes across serial measurements may suggest ACS but many other  chronic and acute conditions are known to elevate hsTnI results.  Refer to the "Links" section for chest pain algorithms and additional  guidance. Performed at [facility], [place] [address] [place], [place] [address]   Resp panel by RT-PCR (RSV, Flu A&B, Covid) Anterior Nasal Swab     Status: Abnormal   Collection Time: 10/05/22  3:03 AM   Specimen: Anterior Nasal Swab  Result Value Ref [place]   SARS Coronavirus 2 by RT PCR POSITIVE (A) NEGATIVE    Comment: (NOTE) SARS-CoV-2 target nucleic acids are DETECTED.  The SARS-CoV-2 RNA is generally detectable in upper respiratory specimens during the acute phase of infection. Positive results are indicative of the presence of the identified virus, but do not rule out bacterial infection or co-infection with other pathogens not detected by the test. Clinical correlation with patient history and other diagnostic information is necessary to determine patient infection status. The expected result is Negative.  Fact Sheet for Patients: EntrepreneurPulse.com.au  Fact Sheet for Healthcare Providers: IncredibleEmployment.be  This test is not yet approved or cleared by the [place] FDA  and  has been authorized for detection and/or diagnosis of SARS-CoV-2 by FDA under an Emergency Use Authorization (EUA).  This EUA will remain in effect (meaning this test can be used) for the duration of  the COVID-19 declaration under Section 564(b)(1) of the A ct, 21 U.S.C. section 360bbb-3(b)(1), unless the authorization is terminated or revoked sooner.     Influenza A by PCR NEGATIVE NEGATIVE   Influenza B by PCR NEGATIVE NEGATIVE    Comment: (NOTE) The Xpert Xpress SARS-CoV-2/FLU/RSV plus assay is intended as an aid in the diagnosis of influenza from Nasopharyngeal swab specimens and should not be used as a sole basis for treatment. Nasal washings and aspirates are unacceptable for Xpert Xpress SARS-CoV-2/FLU/RSV testing.  Fact Sheet for Patients: EntrepreneurPulse.com.au  Fact Sheet for Healthcare Providers: IncredibleEmployment.be  This test is not yet approved or cleared by the [place] FDA and has been authorized for detection and/or diagnosis of SARS-CoV-2 by FDA under an Emergency Use Authorization (EUA). This EUA will remain in effect (meaning this test can be used) for the duration of the COVID-19 declaration under Section 564(b)(1) of the Act, 21 U.S.C. section 360bbb-3(b)(1), unless the authorization is terminated or revoked.     Resp Syncytial Virus by  PCR NEGATIVE NEGATIVE    Comment: (NOTE) Fact Sheet for Patients: EntrepreneurPulse.com.au  Fact Sheet for Healthcare Providers: IncredibleEmployment.be  This test is not yet approved or cleared by the [place] FDA and has been authorized for detection and/or diagnosis of SARS-CoV-2 by FDA under an Emergency Use Authorization (EUA). This EUA will remain in effect (meaning this test can be used) for the duration of the COVID-19 declaration under Section 564(b)(1) of the Act, 21 U.S.C. section 360bbb-3(b)(1), unless the  authorization is terminated or revoked.  Performed at [facility], [place] [address], [place], [place] [address]   Type and screen [place]     Status: None (Preliminary result)   Collection Time: 10/05/22  3:15 AM  Result Value Ref [place]   ABO/RH(D) O NEG    Antibody Screen NEG    Sample Expiration 10/08/2022,2359    Unit Number [id number]    Blood Component Type RED CELLS,LR    Unit division A0    Status of Unit ISSUED    Transfusion Status OK TO TRANSFUSE    Crossmatch Result      Compatible Performed at [facility], [place] [address] [place], [place] [address]   Protime-INR     Status: Abnormal   Collection Time: 10/05/22  3:15 AM  Result Value Ref [place]   Prothrombin Time 25.9 (H) 11.4 - 15.2 seconds   INR 2.4 (H) 0.8 - 1.2    Comment: (NOTE) INR goal varies based on device and disease states. Performed at [facility], [place] [address] [place], [place] [address]   Prepare RBC (crossmatch)     Status: None   Collection Time: 10/05/22  3:15 AM  Result Value Ref Range   Order Confirmation      ORDER PROCESSED BY BLOOD BANK Performed at [facility], [place] [address] [place],  [address]    DG Tibia/Fibula Right  Result Date: 10/05/2022 CLINICAL DATA:  Right lower extremity infection. EXAM: RIGHT TIBIA AND FIBULA - 2 VIEW COMPARISON:  Right ankle series AP Lat 12/01/2019, right knee AP Lat 12/01/2019 FINDINGS: There is diffuse edema, moderate in degree. Numerous surgical clips of the posteromedial upper foreleg. This was seen previously. The popliteal trifurcation arteries are heavily calcified. No new foreign body is seen. No soft tissue gas. There is osteopenia without evidence of fractures or destructive bone lesions. Mild degenerative arthrosis is noted of the knee and ankle and small plantar and dorsal calcaneal enthesopathic spurs. IMPRESSION: 1. Diffuse edema without evidence of soft tissue gas or new foreign body. 2.  Osteopenia without evidence of fractures or lytic bone lesion. 3. Vascular calcifications. Electronically Signed   By: Telford Nab M.D.   On: 10/05/2022 02:07   CT Head Wo Contrast  Result Date: 10/05/2022 CLINICAL DATA:  Head trauma, minor (Age >= 65y); Neck trauma (Age >= 65y) EXAM: CT HEAD WITHOUT CONTRAST CT CERVICAL SPINE WITHOUT CONTRAST TECHNIQUE: Multidetector CT imaging of the head and cervical spine was performed following the standard protocol without intravenous contrast. Multiplanar CT image reconstructions of the cervical spine were also generated. RADIATION DOSE REDUCTION: This exam was performed according to the departmental dose-optimization program which includes automated exposure control, adjustment of the mA and/or kV according to patient size and/or use of iterative reconstruction technique. COMPARISON:  CT head 03/09/2022 FINDINGS: CT HEAD FINDINGS Brain: Cerebral ventricle sizes are concordant with the degree of cerebral volume loss. Patchy and confluent areas of decreased attenuation  are noted throughout the deep and periventricular white matter of the cerebral hemispheres bilaterally, compatible with chronic microvascular ischemic disease. no evidence of large-territorial acute infarction. No parenchymal hemorrhage. No mass lesion. No extra-axial collection. No mass effect or midline shift. No hydrocephalus. Basilar cisterns are patent. Vascular: No hyperdense vessel. Atherosclerotic calcifications are present within the cavernous internal carotid and vertebral arteries. Skull: No acute fracture or focal lesion. Sinuses/Orbits: Paranasal sinuses and mastoid air cells are clear. Bilateral lens replacement. Otherwise the orbits are unremarkable. Other: None. CT CERVICAL SPINE FINDINGS Alignment: Normal. Skull base and vertebrae: Mild multilevel degenerative changes of the spine. No acute fracture. No aggressive appearing focal osseous lesion or focal pathologic process. Soft tissues  and spinal canal: No prevertebral fluid or swelling. No visible canal hematoma. Upper chest: Unremarkable. Other: Atherosclerotic plaque of the carotid arteries within the neck. IMPRESSION: 1. No acute intracranial abnormality. 2. No acute displaced fracture or traumatic listhesis of the cervical spine. Electronically Signed   By: Iven Finn M.D.   On: 10/05/2022 01:44   CT Cervical Spine Wo Contrast  Result Date: 10/05/2022 CLINICAL DATA:  Head trauma, minor (Age >= 65y); Neck trauma (Age >= 65y) EXAM: CT HEAD WITHOUT CONTRAST CT CERVICAL SPINE WITHOUT CONTRAST TECHNIQUE: Multidetector CT imaging of the head and cervical spine was performed following the standard protocol without intravenous contrast. Multiplanar CT image reconstructions of the cervical spine were also generated. RADIATION DOSE REDUCTION: This exam was performed according to the departmental dose-optimization program which includes automated exposure control, adjustment of the mA and/or kV according to patient size and/or use of iterative reconstruction technique. COMPARISON:  CT head 03/09/2022 FINDINGS: CT HEAD FINDINGS Brain: Cerebral ventricle sizes are concordant with the degree of cerebral volume loss. Patchy and confluent areas of decreased attenuation are noted throughout the deep and periventricular white matter of the cerebral hemispheres bilaterally, compatible with chronic microvascular ischemic disease. no evidence of large-territorial acute infarction. No parenchymal hemorrhage. No mass lesion. No extra-axial collection. No mass effect or midline shift. No hydrocephalus. Basilar cisterns are patent. Vascular: No hyperdense vessel. Atherosclerotic calcifications are present within the cavernous internal carotid and vertebral arteries. Skull: No acute fracture or focal lesion. Sinuses/Orbits: Paranasal sinuses and mastoid air cells are clear. Bilateral lens replacement. Otherwise the orbits are unremarkable. Other: None. CT  CERVICAL SPINE FINDINGS Alignment: Normal. Skull base and vertebrae: Mild multilevel degenerative changes of the spine. No acute fracture. No aggressive appearing focal osseous lesion or focal pathologic process. Soft tissues and spinal canal: No prevertebral fluid or swelling. No visible canal hematoma. Upper chest: Unremarkable. Other: Atherosclerotic plaque of the carotid arteries within the neck. IMPRESSION: 1. No acute intracranial abnormality. 2. No acute displaced fracture or traumatic listhesis of the cervical spine. Electronically Signed   By: Iven Finn M.D.   On: 10/05/2022 01:44   CT ABDOMEN PELVIS WO CONTRAST  Result Date: 10/05/2022 CLINICAL DATA:  Abdominal trauma, blunt on warfarin, fall several days ago, new anemia, concern for retroperitoneal hematoma EXAM: CT ABDOMEN AND PELVIS WITHOUT CONTRAST TECHNIQUE: Multidetector CT imaging of the abdomen and pelvis was performed following the standard protocol without IV contrast. RADIATION DOSE REDUCTION: This exam was performed according to the departmental dose-optimization program which includes automated exposure control, adjustment of the mA and/or kV according to patient size and/or use of iterative reconstruction technique. COMPARISON:  02/12/2007 FINDINGS: Lower chest: Densely calcified coronary arteries and aorta. No acute abnormality. Hepatobiliary: Multiple large gallstones layering within the gallbladder. No biliary ductal  dilatation or focal hepatic abnormality. No evidence of a hepatic injury. Pancreas: No focal abnormality or ductal dilatation. Spleen: Normal size.  No focal abnormality or evidence of injury. Adrenals/Urinary Tract: Few small nonobstructing left renal stones. No ureteral stones or hydronephrosis. Adrenal glands and urinary bladder unremarkable. Stomach/Bowel: Stomach, large and small bowel grossly unremarkable. Vascular/Lymphatic: Heavily calcified aorta and iliac vessels. No evidence of aneurysm or adenopathy.  Reproductive: Uterus and adnexa unremarkable.  No mass. Other: No free fluid or free air. Musculoskeletal: No acute bony abnormality. No evidence of retroperitoneal hematoma. IMPRESSION: No acute findings in the abdomen or pelvis. No evidence of retroperitoneal hematoma. Cholelithiasis. Left nephrolithiasis. Aortoiliac atherosclerosis. Electronically Signed   By: Rolm Baptise M.D.   On: 10/05/2022 01:39   DG Chest 2 View  Result Date: 10/04/2022 CLINICAL DATA:  SOB EXAM: CHEST - 2 VIEW COMPARISON:  Chest x-ray 11/03/2019 FINDINGS: Poorly defined left heart border. Otherwise the heart and mediastinal contours are unchanged. Vascular clips overlie the mediastinum. Atherosclerotic plaque. Coronary artery stents. Query developing lingular airspace opacity. Chronic coarsened markings with no overt pulmonary edema. No pleural effusion. No pneumothorax. No acute osseous abnormality.  Intact sternotomy wires. IMPRESSION: 1. Query developing lingular airspace opacity. Followup PA and lateral chest X-ray is recommended in 3-4 weeks following therapy to ensure resolution and exclude underlying malignancy. 2.  Aortic Atherosclerosis (ICD10-I70.0). Electronically Signed   By: Iven Finn M.D.   On: 10/04/2022 20:00    Pending Labs Unresulted Labs (From admission, onward)     Start     Ordered   10/05/22 0646  CBC  Once,   R        10/05/22 0645   10/05/22 0646  Comprehensive metabolic panel  Once,   R        10/05/22 0645   10/05/22 0646  Magnesium  Once,   R        10/05/22 0645   10/05/22 0646  Phosphorus  Once,   R        10/05/22 0645   10/05/22 0608  C-reactive protein  Once,   R        10/05/22 0609   10/05/22 0608  D-dimer, quantitative  Once,   R        10/05/22 0609   10/05/22 0608  Lactate dehydrogenase  Once,   R        10/05/22 0609   10/05/22 0554  Culture, blood (Routine X 2) w Reflex to ID Panel  BLOOD CULTURE X 2,   R     Question:  Patient immune status  Answer:  Normal   10/05/22  0553   10/04/22 1910  Urinalysis, Routine w reflex microscopic  Once,   URGENT        10/04/22 1910            Vitals/Pain Today's Vitals   10/05/22 0715 10/05/22 0730 10/05/22 0739 10/05/22 0745  BP: 120/75 123/64  130/68  Pulse: 88 74  (!) 101  Resp: (!) 24 (!) 22  (!) 24  Temp:      TempSrc:      SpO2: 100% 100% 98% 99%  Weight:      Height:      PainSc:        Isolation Precautions Airborne and Contact precautions  Medications Medications  0.9 %  sodium chloride infusion (Manually program via Guardrails IV Fluids) (has no administration in time range)  guaiFENesin-dextromethorphan (ROBITUSSIN DM) 100-10 MG/5ML syrup 5 mL (  has no administration in time range)  acetaminophen (TYLENOL) tablet 500 mg (has no administration in time range)  oxyCODONE (Oxy IR/ROXICODONE) immediate release tablet 5 mg (has no administration in time range)  polyethylene glycol (MIRALAX / GLYCOLAX) packet 17 g (has no administration in time range)  Ipratropium-Albuterol (COMBIVENT) respimat 1 puff (has no administration in time range)  ascorbic acid (VITAMIN C) tablet 500 mg (has no administration in time range)  zinc sulfate capsule 220 mg (has no administration in time range)  amiodarone (PACERONE) tablet 200 mg (has no administration in time range)  atorvastatin (LIPITOR) tablet 80 mg (has no administration in time range)  ezetimibe (ZETIA) tablet 10 mg (has no administration in time range)  folic acid (FOLVITE) tablet 1 mg (has no administration in time range)  multivitamin with minerals tablet 1 tablet (has no administration in time range)  multivitamin (PROSIGHT) tablet 1 tablet (has no administration in time range)  remdesivir 200 mg in sodium chloride 0.9% 250 mL IVPB (200 mg Intravenous New Bag/Given 10/05/22 0752)    Followed by  remdesivir 100 mg in sodium chloride 0.9 % 100 mL IVPB (has no administration in time range)  warfarin (COUMADIN) tablet 1.25 mg (has no administration in  time range)  Warfarin - Pharmacist Dosing Inpatient (has no administration in time range)  prochlorperazine (COMPAZINE) injection 5 mg (has no administration in time range)  furosemide (LASIX) injection 40 mg (40 mg Intravenous Given 10/05/22 0405)  vancomycin (VANCOCIN) IVPB 1000 mg/200 mL premix (0 mg Intravenous Stopped 10/05/22 0459)    Mobility walks with device High fall risk   Focused Assessments Cardiac Assessment Handoff:    Lab Results  Component Value Date   CKTOTAL 245 (H) 10/01/2018   CKMB 9.1 (H) 04/21/2008   TROPONINI 0.55 ([place]) 04/15/2015   No results found for: "DDIMER" Does the Patient currently have chest pain? No   , Pulmonary Assessment Handoff:  Lung sounds:  Clear O2 Device: Room Air      R Recommendations: See Admitting Provider Note  Report given to:   Additional Notes:

## 2022-10-05 NOTE — H&P (Addendum)
History and Physical  [name]:[id number] DOB: 1933-02-22 DOA: 10/04/2022  Referring physician: Dr. Ralene Bathe, [place]. PCP: Gaynelle Arabian, MD  Outpatient Specialists: Cardiology. Patient coming from: Home.  Chief Complaint: Right leg pain and redness.  HPI: [name] is a 89 y.o. female with medical history significant for coronary artery disease status post CABG, aortic valve disease status post mechanical AVR, paroxysmal A-fib, on Coumadin, hypertension, hyperlipidemia, type 2 diabetes, history of GI bleed, CKD 3B, chronic diastolic CHF, who presented to [facility] ED due to right leg pain, swelling, redness and warmth.  Endorses that she fell few days ago.  She is unsure if she hit her right leg.  She noticed the redness and the pain yesterday around 6 PM and it rapidly progressed.  In the ED, volume overload on exam 3+ pitting edema.  Right lower extremity erythematous, edematous, tender, warm to the touch.  Due to concern for cellulitis she was started on empiric IV antibiotics.  Peripheral blood cultures x 2 ordered.  COVID-19 screening test returned positive.  Denies any sick contacts.  Presenting hemoglobin 7.3.  1 unit PRBCs ordered to be transfused.  ED Course: Tmax 98.6.  BP 128/55, pulse 92, respiratory 22, O2 saturation 99% on room air.  Lab studies remarkable for sodium 134, serum bicarb 21, GFR 29.  Glucose 128, BUN 29, creatinine 1.68, albumin 3.3, AST 69, ALT 51.  BNP 236.  Troponin 13, 13.  Lactic acid 2.0, 1.6.  Hemoglobin 7.3.  Review of Systems: Review of systems as noted in the HPI. All other systems reviewed and are negative.   Past Medical History:  Diagnosis Date   Anemia    Anginal pain ([place])    Arteriovenous malformation of gastrointestinal tract    Arthritis    "fingers mostly" (04/12/2015)   Carotid artery disease ([place])    Carotid US 3/17:  93-23% RICA; 55-73% LICA; Elevated bilateral subclavian artery velocities >>f/u 1 year. // Carotid US 4/18: R 40-59; L  1-39 >> FU 1 year // Carotid US 10/2018: R 40-59; L 1-39, L subclavian stenosis    CHF (congestive heart failure) (HCC)    Chronic kidney disease (CKD), stage III (moderate) (HCC)    Chronic lower GI bleeding    "today; last time was ~ 8 yr ago; used to have them often before that too" (02/11/2013)   Coronary artery disease    DJD (degenerative joint disease)    Dysrhythmia    Heart murmur    History of blood transfusion    "a few times over the years; usually related to my Coumadin" (04/12/2015   History of gout    Hyperlipemia    Hypertension    Macula lutea degeneration    Old MI (myocardial infarction)    "a coulple /dr in 02/2008; I never even knew I'd had them" (04/12/2015)   Type II diabetes mellitus ([place])    no medications, diet controlled   Past Surgical History:  Procedure Laterality Date   AORTIC VALVE REPLACEMENT  09/23/1991   St. Jude   APPENDECTOMY  09/23/1951   BIOPSY  11/06/2019   Procedure: BIOPSY;  Surgeon: Doran Stabler, MD;  Location: [facility] ENDOSCOPY;  Service: Gastroenterology;;   CARDIAC CATHETERIZATION  ~1990   CARDIAC CATHETERIZATION  04/12/2015   Procedure: Coronary Stent Intervention;  Surgeon: Jettie Booze, MD;    SYNERGY DES 3.5X16 to the ostial RCA    CARDIAC CATHETERIZATION  04/12/2015   Procedure: Coronary/Graft Angiography;  Surgeon: Conception Oms  Murtis Sink, MD; LAD & CFX 100%, patent LIMA-LAD, SVG-D1; SVG-OM-PDA first limb 100%, 2nd limb patent; oRCA 75%>0 w/ stent   CARDIAC VALVE REPLACEMENT  09/22/1992   St. Jude/notes 10/29/2003 (02/11/2013)   CARDIOVERSION N/A 08/15/2019   Procedure: CARDIOVERSION;  Surgeon: Thayer Headings, MD;  Location: [facility] END[facility]COPY;  Service: Cardiovascular;  Laterality: N/A;   COLONOSCOPY N/A 02/13/2013   Procedure: COLONOSCOPY;  Surgeon: Lafayette Dragon, MD;  Location: [facility] ENDOSCOPY;  Service: Endoscopy;  Laterality: N/A;   CORONARY ANGIOPLASTY WITH STENT PLACEMENT  2009+   "3 at least; put in 1 stent at a time" (02/11/2013)    CORONARY ARTERY BYPASS GRAFT  09/22/1988   LIMA-LAD, SVG-OM-PDA, SVG-D1   DILATION AND CURETTAGE OF UTERUS  05/24/1959   'after a miscarriage" (02/11/2013)   ENTEROSCOPY N/A 02/13/2013   Procedure: ENTEROSCOPY;  Surgeon: Lafayette Dragon, MD;  Location: [facility] ENDOSCOPY;  Service: Endoscopy;  Laterality: N/A;   ENTEROSCOPY N/A 11/06/2019   Procedure: ENTEROSCOPY;  Surgeon: Doran Stabler, MD;  Location: [place];  Service: Gastroenterology;  Laterality: N/A;   ESOPHAGOGASTRODUODENOSCOPY (EGD) WITH PROPOFOL N/A 12/01/2019   Procedure: ESOPHAGOGASTRODUODENOSCOPY (EGD) WITH PROPOFOL;  Surgeon: Mauri Pole, MD;  Location: [place] ENDOSCOPY;  Service: Endoscopy;  Laterality: N/A;   EXPLORATORY LAPAROTOMY  02/24/2008   which revealed a retroperitoneal hematoma and bleeding from the right external iliac artery/notes 03/02/2008 (02/11/2013)    FLEXIBLE SIGMOIDOSCOPY N/A 04/21/2015   Procedure: FLEXIBLE SIGMOIDOSCOPY;  Surgeon: Ladene Artist, MD;  Location: [facility] ENDOSCOPY;  Service: Endoscopy;  Laterality: N/A;   HOT HEMOSTASIS N/A 11/06/2019   Procedure: HOT HEMOSTASIS (ARGON PLASMA COAGULATION/BICAP);  Surgeon: Doran Stabler, MD;  Location: [place];  Service: Gastroenterology;  Laterality: N/A;   LUMBAR LAMINECTOMY/DECOMPRESSION MICRODISCECTOMY Left 06/02/2022   Procedure: LEFT LUMBAR FOUR-FIVE MICRODISCECTOMY;  Surgeon: Newman Pies, MD;  Location: [place];  Service: Neurosurgery;  Laterality: Left;  3C   TEE WITHOUT CARDIOVERSION N/A 12/01/2018   Procedure: TRANSESOPHAGEAL ECHOCARDIOGRAM (TEE);  Surgeon: Jerline Pain, MD;  Location[facility] ENDOSCOPY;  Service: Cardiovascular;  Laterality: N/A;    Social History:  reports that she has never smoked. She has never used smokeless tobacco. She reports that she does not drink alcohol and does not use drugs.   Allergies  Allergen Reactions   Darvon [Propoxyphene] Nausea And Vomiting   Lisinopril Cough   Septra  [Sulfamethoxazole-Trimethoprim] Other (See Comments)    Increased INR   Warfarin And Related Other (See Comments)    ONLY TOLERATES BRAND   Trazodone And Nefazodone     Severe sweating   Zolpidem Tartrate     Hallucinations   Penicillins Rash    Did it involve swelling of the face/tongue/throat, SOB, or low BP? No Did it involve sudden or severe rash/hives, skin peeling, or any reaction on the inside of your mouth or nose? No Did you need to seek medical attention at a hospital or doctor's office? No When did it last happen?  15 years     If all above answers are "NO", may proceed with cephalosporin use.    Family History  Problem Relation Age of Onset   Heart disease Mother    Heart disease Father       Prior to Admission medications   Medication Sig Start Date End Date Taking? Authorizing Provider  amiodarone (PACERONE) 200 MG tablet Take 1 tablet by mouth once daily 03/10/22   Jettie Booze, MD  atorvastatin (LIPITOR) 80 MG tablet  Take 1 tablet by mouth once daily 05/12/22   Jettie Booze, MD  bevacizumab (AVASTIN) 1.25 mg/0.1 mL SOLN Place 1.25 mg into the left eye every 8 (eight) weeks.    [provider]  docusate sodium (COLACE) 100 MG capsule Take 1 capsule (100 mg total) by mouth 2 (two) times daily. 06/03/22   Newman Pies, MD  ezetimibe (ZETIA) 10 MG tablet Take 1 tablet by mouth once daily 02/03/22   Jettie Booze, MD  FeFum-FePo-FA-B Cmp-C-Zn-Mn-Cu (SE-TAN PLUS) 162-115.2-1 MG CAPS Take 1 capsule by mouth 2 (two) times daily.  05/22/15   [provider]  folic acid (FOLVITE) 469 MCG tablet Take 400 mcg by mouth every evening.     [provider]  furosemide (LASIX) 40 MG tablet Take 1 tablet (40 mg total) by mouth 2 (two) times daily as needed. 07/10/22   Jettie Booze, MD  isosorbide mononitrate (IMDUR) 60 MG 24 hr tablet Take 1 tablet by mouth once daily 05/07/22   Jettie Booze, MD  losartan (COZAAR) 50 MG  tablet Take 50 mg by mouth daily.    [provider]  Multiple Vitamin (MULTIVITAMIN WITH MINERALS) TABS Take 1 tablet by mouth daily.    [provider]  Multiple Vitamins-Minerals (PRESERVISION AREDS 2 PO) Take 1 tablet by mouth 2 (two) times daily.     [provider]  nitroGLYCERIN (NITROSTAT) 0.4 MG SL tablet DISSOLVE ONE TABLET UNDER THE TONGUE EVERY 5 MINUTES AS NEEDED FOR CHEST PAIN.  DO NOT EXCEED A TOTAL OF 3 DOSES IN 15 MINUTES 02/14/20   Jettie Booze, MD  Omega-3 Fatty Acids (FISH OIL) 600 MG CAPS Take 1,200 mg by mouth in the morning and at bedtime.    [provider]  warfarin (JANTOVEN) 2.5 MG tablet TAKE 1/2 TABLET TO 1 TABLET BY MOUTH DAILY AS DIRECTED BY COUMADIN CLINIC 10/03/22   Jettie Booze, MD    Physical Exam: BP (!) 149/135   Pulse 88   Temp 98.1 F (36.7 C) (Oral)   Resp (!) 28   Ht 5' (1.524 m)   Wt 68.6 kg   SpO2 97%   BMI 29.54 kg/m   General: 89 y.o. year-old female well developed well nourished in no acute distress.  Alert and oriented x3. Cardiovascular: Regular rate and rhythm with no rubs or gallops.  No thyromegaly or JVD noted.  No lower extremity edema. 2/4 pulses in all 4 extremities. Respiratory: Clear to auscultation with no wheezes or rales. Good inspiratory effort. Abdomen: Soft nontender nondistended with normal bowel sounds x4 quadrants. Muskuloskeletal: No cyanosis, clubbing or edema noted bilaterally Neuro: CN II-XII intact, strength, sensation, reflexes Skin: No ulcerative lesions noted or rashes Psychiatry: Judgement and insight appear normal. Mood is appropriate for condition and setting          Labs on Admission:  Basic Metabolic Panel: Recent Labs  Lab 10/04/22 1908  NA 134*  K 4.0  CL 102  CO2 21*  GLUCOSE 128*  BUN 29*  CREATININE 1.68*  CALCIUM 8.4*   Liver Function Tests: Recent Labs  Lab 10/04/22 1908  AST 69*  ALT 51*  ALKPHOS 45  BILITOT 0.9  PROT 6.0*   ALBUMIN 3.3*   No results for input(s): "LIPASE", "AMYLASE" in the last 168 hours. No results for input(s): "AMMONIA" in the last 168 hours. CBC: Recent Labs  Lab 10/04/22 1908  WBC 4.6  NEUTROABS 3.9  HGB 7.3*  HCT 25.3*  MCV 109.5*  PLT 228   Cardiac Enzymes: No results for input(s): "CKTOTAL", "CKMB", "CKMBINDEX", "TROPONINI" in the last 168 hours.  BNP (last 3 results) Recent Labs    10/04/22 1910  BNP 236.3*    ProBNP (last 3 results) No results for input(s): "PROBNP" in the last 8760 hours.  CBG: No results for input(s): "GLUCAP" in the last 168 hours.  Radiological Exams on Admission: DG Tibia/Fibula Right  Result Date: 10/05/2022 CLINICAL DATA:  Right lower extremity infection. EXAM: RIGHT TIBIA AND FIBULA - 2 VIEW COMPARISON:  Right ankle series AP Lat 12/01/2019, right knee AP Lat 12/01/2019 FINDINGS: There is diffuse edema, moderate in degree. Numerous surgical clips of the posteromedial upper foreleg. This was seen previously. The popliteal trifurcation arteries are heavily calcified. No new foreign body is seen. No soft tissue gas. There is osteopenia without evidence of fractures or destructive bone lesions. Mild degenerative arthrosis is noted of the knee and ankle and small plantar and dorsal calcaneal enthesopathic spurs. IMPRESSION: 1. Diffuse edema without evidence of soft tissue gas or new foreign body. 2. Osteopenia without evidence of fractures or lytic bone lesion. 3. Vascular calcifications. Electronically Signed   By: Telford Nab M.D.   On: 10/05/2022 02:07   CT Head Wo Contrast  Result Date: 10/05/2022 CLINICAL DATA:  Head trauma, minor (Age >= 65y); Neck trauma (Age >= 65y) EXAM: CT HEAD WITHOUT CONTRAST CT CERVICAL SPINE WITHOUT CONTRAST TECHNIQUE: Multidetector CT imaging of the head and cervical spine was performed following the standard protocol without intravenous contrast. Multiplanar CT image reconstructions of the cervical spine were also  generated. RADIATION DOSE REDUCTION: This exam was performed according to the departmental dose-optimization program which includes automated exposure control, adjustment of the mA and/or kV according to patient size and/or use of iterative reconstruction technique. COMPARISON:  CT head 03/09/2022 FINDINGS: CT HEAD FINDINGS Brain: Cerebral ventricle sizes are concordant with the degree of cerebral volume loss. Patchy and confluent areas of decreased attenuation are noted throughout the deep and periventricular white matter of the cerebral hemispheres bilaterally, compatible with chronic microvascular ischemic disease. no evidence of large-territorial acute infarction. No parenchymal hemorrhage. No mass lesion. No extra-axial collection. No mass effect or midline shift. No hydrocephalus. Basilar cisterns are patent. Vascular: No hyperdense vessel. Atherosclerotic calcifications are present within the cavernous internal carotid and vertebral arteries. Skull: No acute fracture or focal lesion. Sinuses/Orbits: Paranasal sinuses and mastoid air cells are clear. Bilateral lens replacement. Otherwise the orbits are unremarkable. Other: None. CT CERVICAL SPINE FINDINGS Alignment: Normal. Skull base and vertebrae: Mild multilevel degenerative changes of the spine. No acute fracture. No aggressive appearing focal osseous lesion or focal pathologic process. Soft tissues and spinal canal: No prevertebral fluid or swelling. No visible canal hematoma. Upper chest: Unremarkable. Other: Atherosclerotic plaque of the carotid arteries within the neck. IMPRESSION: 1. No acute intracranial abnormality. 2. No acute displaced fracture or traumatic listhesis of the cervical spine. Electronically Signed   By: Iven Finn M.D.   On: 10/05/2022 01:44   CT Cervical Spine Wo Contrast  Result Date: 10/05/2022 CLINICAL DATA:  Head trauma, minor (Age >= 65y); Neck trauma (Age >= 65y) EXAM: CT HEAD WITHOUT CONTRAST CT CERVICAL SPINE  WITHOUT CONTRAST TECHNIQUE: Multidetector CT imaging of the head and cervical spine was performed following the standard protocol without intravenous contrast. Multiplanar CT image reconstructions of the cervical spine were also generated. RADIATION DOSE REDUCTION: This exam was performed according to the departmental dose-optimization program which includes  automated exposure control, adjustment of the mA and/or kV according to patient size and/or use of iterative reconstruction technique. COMPARISON:  CT head 03/09/2022 FINDINGS: CT HEAD FINDINGS Brain: Cerebral ventricle sizes are concordant with the degree of cerebral volume loss. Patchy and confluent areas of decreased attenuation are noted throughout the deep and periventricular white matter of the cerebral hemispheres bilaterally, compatible with chronic microvascular ischemic disease. no evidence of large-territorial acute infarction. No parenchymal hemorrhage. No mass lesion. No extra-axial collection. No mass effect or midline shift. No hydrocephalus. Basilar cisterns are patent. Vascular: No hyperdense vessel. Atherosclerotic calcifications are present within the cavernous internal carotid and vertebral arteries. Skull: No acute fracture or focal lesion. Sinuses/Orbits: Paranasal sinuses and mastoid air cells are clear. Bilateral lens replacement. Otherwise the orbits are unremarkable. Other: None. CT CERVICAL SPINE FINDINGS Alignment: Normal. Skull base and vertebrae: Mild multilevel degenerative changes of the spine. No acute fracture. No aggressive appearing focal osseous lesion or focal pathologic process. Soft tissues and spinal canal: No prevertebral fluid or swelling. No visible canal hematoma. Upper chest: Unremarkable. Other: Atherosclerotic plaque of the carotid arteries within the neck. IMPRESSION: 1. No acute intracranial abnormality. 2. No acute displaced fracture or traumatic listhesis of the cervical spine. Electronically Signed   By:  Iven Finn M.D.   On: 10/05/2022 01:44   CT ABDOMEN PELVIS WO CONTRAST  Result Date: 10/05/2022 CLINICAL DATA:  Abdominal trauma, blunt on warfarin, fall several days ago, new anemia, concern for retroperitoneal hematoma EXAM: CT ABDOMEN AND PELVIS WITHOUT CONTRAST TECHNIQUE: Multidetector CT imaging of the abdomen and pelvis was performed following the standard protocol without IV contrast. RADIATION DOSE REDUCTION: This exam was performed according to the departmental dose-optimization program which includes automated exposure control, adjustment of the mA and/or kV according to patient size and/or use of iterative reconstruction technique. COMPARISON:  02/12/2007 FINDINGS: Lower chest: Densely calcified coronary arteries and aorta. No acute abnormality. Hepatobiliary: Multiple large gallstones layering within the gallbladder. No biliary ductal dilatation or focal hepatic abnormality. No evidence of a hepatic injury. Pancreas: No focal abnormality or ductal dilatation. Spleen: Normal size.  No focal abnormality or evidence of injury. Adrenals/Urinary Tract: Few small nonobstructing left renal stones. No ureteral stones or hydronephrosis. Adrenal glands and urinary bladder unremarkable. Stomach/Bowel: Stomach, large and small bowel grossly unremarkable. Vascular/Lymphatic: Heavily calcified aorta and iliac vessels. No evidence of aneurysm or adenopathy. Reproductive: Uterus and adnexa unremarkable.  No mass. Other: No free fluid or free air. Musculoskeletal: No acute bony abnormality. No evidence of retroperitoneal hematoma. IMPRESSION: No acute findings in the abdomen or pelvis. No evidence of retroperitoneal hematoma. Cholelithiasis. Left nephrolithiasis. Aortoiliac atherosclerosis. Electronically Signed   By: Rolm Baptise M.D.   On: 10/05/2022 01:39   DG Chest 2 View  Result Date: 10/04/2022 CLINICAL DATA:  SOB EXAM: CHEST - 2 VIEW COMPARISON:  Chest x-ray 11/03/2019 FINDINGS: Poorly defined left  heart border. Otherwise the heart and mediastinal contours are unchanged. Vascular clips overlie the mediastinum. Atherosclerotic plaque. Coronary artery stents. Query developing lingular airspace opacity. Chronic coarsened markings with no overt pulmonary edema. No pleural effusion. No pneumothorax. No acute osseous abnormality.  Intact sternotomy wires. IMPRESSION: 1. Query developing lingular airspace opacity. Followup PA and lateral chest X-ray is recommended in 3-4 weeks following therapy to ensure resolution and exclude underlying malignancy. 2.  Aortic Atherosclerosis (ICD10-I70.0). Electronically Signed   By: Iven Finn M.D.   On: 10/04/2022 20:00    EKG: I independently viewed the EKG done and  my findings are as followed: Atrial fibrillation rate of 96.  Nonspecific ST-T changes.  QTc 500.  Assessment/Plan Present on Admission:  Cellulitis  Principal Problem:   Cellulitis  Right lower extremity cellulitis, POA Necrotizing fasciitis suspected CT scan right lower extremity pending Continue broad-spectrum IV antibiotics empirically Follow blood cultures Pain control and bowel regimen  COVID-19 viral infection Unclear duration of symptoms, coughing on exam. O2 saturation 100% on room air Chest x-ray developing lingular airspace opacity Will cover with 3 days of Remdesivir since Paxlovid has drug-drug interaction with Amiodarone.  Acute blood loss anemia History of gastric ulcers She is unsure if she has had any overt bleeding Presented with hemoglobin 7.3, history of gastric ulcer Hemoglobin 11.3, 4 months ago. 1 unit PRBC ordered to be transfused by EDP. Obtain CBC in the morning post blood transfusion. Consider GI evaluation if hemoglobin drops posttransfusion.  Prolonged QTc QTc 500 on admission twelve-lead EKG Avoid QTc prolonging agents Optimize magnesium and potassium levels.  Paroxysmal A-fib, on Coumadin Mechanical valve on Coumadin INR 2.4 Pharmacy  consulted to dose Coumadin  Generalized weakness PT OT assessment Fall precautions  Hyperlipidemia Resume home regimen.  Chronic diastolic CHF Euvolemic on exam, BNP 236 Strict I's and O's and daily weight   Critical care time: 55 minutes.   DVT prophylaxis: Home Coumadin  Code Status: Full code, per the patient.  Family Communication: None at bedside  Disposition Plan: Admitted to telemetry medical unit  Consults called: None.  Admission status: Inpatient status.   Status is: Inpatient The patient requires at least 2 midnights for further evaluation and treatment of present condition.   Kayleen Memos MD [facility] Pager [phone]  If 7PM-7AM, please contact night-coverage [email or web address] Password [facility]  10/05/2022, 4:40 AM

## 2022-10-05 NOTE — Progress Notes (Signed)
PROGRESS NOTE    [name]  ZOX:[id number] DOB: [birth year] DOA: 10/04/2022 PCP: Gaynelle Arabian, MD   Brief Narrative:  89 year old with history of CAD status post CABG, aortic valve disease status post mechanical AVR, paroxysmal A-fib on Coumadin, HTN, HLD, DM2, GI bleed, CKD stage IIIb, diastolic CHF comes to the ED with complaints of right lower extremity pain, swelling and erythema.  There was concerns of volume overload cellulitis therefore admitted the patient.  Screening for COVID-19 was also positive.  Due to signs of anemia, patient was given 1 unit of PRBC   Assessment & Plan:  Principal Problem:   Cellulitis     Right lower extremity cellulitis, POA Necrotizing fasciitis suspected X-ray showing diffuse edema.  CT scan right lower extremity does not show any evidence of deep infection.  Follow-up culture data.  Will start patient on IV vancomycin.  Pain control, bowel regimen   COVID-19 viral infection Chest x-ray showing mild lingular opacity, upon admission started on 3 days of IV remdesivir.  Blood loss anemia, iron deficiency History of gastric ulcers No obvious signs of bleeding.  Baseline hemoglobin 11, admission hemoglobin 7.3.  1 unit PRBC transfusion ordered.  Endoscopy given March 2021 showed nonbleeding gastric ulcer.  Patient is on Coumadin for mechanical valve.  Briefly discussed case with Dr. Benson Norway from GI who agrees to proceed with conservative management at this time and continuing Coumadin. Iron studies showed low saturation and borderline low ferritin.  Will give IV iron PPI twice daily  CAD status post CABG in 1990s Status post PCI 2016 -Currently chest pain-free.  Continue home medications   Prolonged QTc Continue to monitor   Paroxysmal A-fib, on Coumadin Mechanical aortic valve Has mechanical valve in place, INR therapeutic.   Generalized weakness PT/OT  Essential hypertension -Resume home meds.  IV as needed ordered  Diabetes mellitus  type 2 -Sliding scale and Accu-Cheks   Hyperlipidemia Resume home regimen.   Chronic diastolic CHF Euvolemic on exam, BNP 236 Strict I's and O's and daily weight      DVT prophylaxis: Coumadin Code Status: Full code Family Communication:  Called Fritz Pickerel  Status is: Inpatient Maintain hospital stay for IV antibiotics and acute blood loss anemia management   Subjective: Patient feeling ok, no complaints or obvious signs of GI bleed.    Examination:  General exam: Appears calm and comfortable  Respiratory system: mild rhonchi.  Cardiovascular system: S1 & S2 heard, RRR. No JVD, murmurs, rubs, gallops or clicks. No pedal edema. Gastrointestinal system: Abdomen is nondistended, soft and nontender. No organomegaly or masses felt. Normal bowel sounds heard. Central nervous system: Alert and oriented. No focal neurological deficits. Extremities: Symmetric 5 x 5 power. Skin: No rashes, lesions or ulcers Psychiatry: Judgement and insight appear normal. Mood & affect appropriate.     Objective: Vitals:   10/05/22 0730 10/05/22 0739 10/05/22 0745 10/05/22 0800  BP: 123/64  130/68 116/85  Pulse: 74  (!) 101 96  Resp: (!) 22  (!) 24 (!) 21  Temp:   98.3 F (36.8 C)   TempSrc:   Oral   SpO2: 100% 98% 99% 98%  Weight:      Height:        Intake/Output Summary (Last 24 hours) at 10/05/2022 0832 Last data filed at 10/05/2022 0739 Gross per 24 hour  Intake 722 ml  Output --  Net 722 ml   Filed Weights   10/04/22 1850  Weight: 68.6 kg     Data Reviewed:  CBC: Recent Labs  Lab 10/04/22 1908  WBC 4.6  NEUTROABS 3.9  HGB 7.3*  HCT 25.3*  MCV 109.5*  PLT 478   Basic Metabolic Panel: Recent Labs  Lab 10/04/22 1908  NA 134*  K 4.0  CL 102  CO2 21*  GLUCOSE 128*  BUN 29*  CREATININE 1.68*  CALCIUM 8.4*   GFR: Estimated Creatinine Clearance: 20.4 mL/min (A) (by C-G formula based on SCr of 1.68 mg/dL (H)). Liver Function Tests: Recent Labs  Lab  10/04/22 1908  AST 69*  ALT 51*  ALKPHOS 45  BILITOT 0.9  PROT 6.0*  ALBUMIN 3.3*   No results for input(s): "LIPASE", "AMYLASE" in the last 168 hours. No results for input(s): "AMMONIA" in the last 168 hours. Coagulation Profile: Recent Labs  Lab 10/03/22 1126 10/05/22 0315  INR 2.9 2.4*   Cardiac Enzymes: No results for input(s): "CKTOTAL", "CKMB", "CKMBINDEX", "TROPONINI" in the last 168 hours. BNP (last 3 results) No results for input(s): "PROBNP" in the last 8760 hours. HbA1C: No results for input(s): "HGBA1C" in the last 72 hours. CBG: No results for input(s): "GLUCAP" in the last 168 hours. Lipid Profile: No results for input(s): "CHOL", "HDL", "LDLCALC", "TRIG", "CHOLHDL", "LDLDIRECT" in the last 72 hours. Thyroid Function Tests: No results for input(s): "TSH", "T4TOTAL", "FREET4", "T3FREE", "THYROIDAB" in the last 72 hours. Anemia Panel: No results for input(s): "VITAMINB12", "FOLATE", "FERRITIN", "TIBC", "IRON", "RETICCTPCT" in the last 72 hours. Sepsis Labs: Recent Labs  Lab 10/04/22 1908 10/04/22 2145  LATICACIDVEN 2.0* 1.6    Recent Results (from the past 240 hour(s))  Resp panel by RT-PCR (RSV, Flu A&B, Covid) Anterior Nasal Swab     Status: Abnormal   Collection Time: 10/05/22  3:03 AM   Specimen: Anterior Nasal Swab  Result Value Ref Range Status   SARS Coronavirus 2 by RT PCR POSITIVE (A) NEGATIVE Final    Comment: (NOTE) SARS-CoV-2 target nucleic acids are DETECTED.  The SARS-CoV-2 RNA is generally detectable in upper respiratory specimens during the acute phase of infection. Positive results are indicative of the presence of the identified virus, but do not rule out bacterial infection or co-infection with other pathogens not detected by the test. Clinical correlation with patient history and other diagnostic information is necessary to determine patient infection status. The expected result is Negative.  Fact Sheet for  Patients: EntrepreneurPulse.com.au  Fact Sheet for Healthcare Providers: IncredibleEmployment.be  This test is not yet approved or cleared by the [place] FDA and  has been authorized for detection and/or diagnosis of SARS-CoV-2 by FDA under an Emergency Use Authorization (EUA).  This EUA will remain in effect (meaning this test can be used) for the duration of  the COVID-19 declaration under Section 564(b)(1) of the A ct, 21 U.S.C. section 360bbb-3(b)(1), unless the authorization is terminated or revoked sooner.     Influenza A by PCR NEGATIVE NEGATIVE Final   Influenza B by PCR NEGATIVE NEGATIVE Final    Comment: (NOTE) The Xpert Xpress SARS-CoV-2/FLU/RSV plus assay is intended as an aid in the diagnosis of influenza from Nasopharyngeal swab specimens and should not be used as a sole basis for treatment. Nasal washings and aspirates are unacceptable for Xpert Xpress SARS-CoV-2/FLU/RSV testing.  Fact Sheet for Patients: EntrepreneurPulse.com.au  Fact Sheet for Healthcare Providers: IncredibleEmployment.be  This test is not yet approved or cleared by the [place] FDA and has been authorized for detection and/or diagnosis of SARS-CoV-2 by FDA under an Emergency Use Authorization (EUA). This EUA  will remain in effect (meaning this test can be used) for the duration of the COVID-19 declaration under Section 564(b)(1) of the Act, 21 U.S.C. section 360bbb-3(b)(1), unless the authorization is terminated or revoked.     Resp Syncytial Virus by PCR NEGATIVE NEGATIVE Final    Comment: (NOTE) Fact Sheet for Patients: EntrepreneurPulse.com.au  Fact Sheet for Healthcare Providers: IncredibleEmployment.be  This test is not yet approved or cleared by the [place] FDA and has been authorized for detection and/or diagnosis of SARS-CoV-2 by FDA under an Emergency Use  Authorization (EUA). This EUA will remain in effect (meaning this test can be used) for the duration of the COVID-19 declaration under Section 564(b)(1) of the Act, 21 U.S.C. section 360bbb-3(b)(1), unless the authorization is terminated or revoked.  Performed at [facility], [place] [address], [place], [place] [address]          Radiology Studies: DG Tibia/Fibula Right  Result Date: 10/05/2022 CLINICAL DATA:  Right lower extremity infection. EXAM: RIGHT TIBIA AND FIBULA - 2 VIEW COMPARISON:  Right ankle series AP Lat 12/01/2019, right knee AP Lat 12/01/2019 FINDINGS: There is diffuse edema, moderate in degree. Numerous surgical clips of the posteromedial upper foreleg. This was seen previously. The popliteal trifurcation arteries are heavily calcified. No new foreign body is seen. No soft tissue gas. There is osteopenia without evidence of fractures or destructive bone lesions. Mild degenerative arthrosis is noted of the knee and ankle and small plantar and dorsal calcaneal enthesopathic spurs. IMPRESSION: 1. Diffuse edema without evidence of soft tissue gas or new foreign body. 2. Osteopenia without evidence of fractures or lytic bone lesion. 3. Vascular calcifications. Electronically Signed   By: Telford Nab M.D.   On: 10/05/2022 02:07   CT Head Wo Contrast  Result Date: 10/05/2022 CLINICAL DATA:  Head trauma, minor (Age >= 65y); Neck trauma (Age >= 65y) EXAM: CT HEAD WITHOUT CONTRAST CT CERVICAL SPINE WITHOUT CONTRAST TECHNIQUE: Multidetector CT imaging of the head and cervical spine was performed following the standard protocol without intravenous contrast. Multiplanar CT image reconstructions of the cervical spine were also generated. RADIATION DOSE REDUCTION: This exam was performed according to the departmental dose-optimization program which includes automated exposure control, adjustment of the mA and/or kV according to patient size and/or use of iterative reconstruction  technique. COMPARISON:  CT head 03/09/2022 FINDINGS: CT HEAD FINDINGS Brain: Cerebral ventricle sizes are concordant with the degree of cerebral volume loss. Patchy and confluent areas of decreased attenuation are noted throughout the deep and periventricular white matter of the cerebral hemispheres bilaterally, compatible with chronic microvascular ischemic disease. no evidence of large-territorial acute infarction. No parenchymal hemorrhage. No mass lesion. No extra-axial collection. No mass effect or midline shift. No hydrocephalus. Basilar cisterns are patent. Vascular: No hyperdense vessel. Atherosclerotic calcifications are present within the cavernous internal carotid and vertebral arteries. Skull: No acute fracture or focal lesion. Sinuses/Orbits: Paranasal sinuses and mastoid air cells are clear. Bilateral lens replacement. Otherwise the orbits are unremarkable. Other: None. CT CERVICAL SPINE FINDINGS Alignment: Normal. Skull base and vertebrae: Mild multilevel degenerative changes of the spine. No acute fracture. No aggressive appearing focal osseous lesion or focal pathologic process. Soft tissues and spinal canal: No prevertebral fluid or swelling. No visible canal hematoma. Upper chest: Unremarkable. Other: Atherosclerotic plaque of the carotid arteries within the neck. IMPRESSION: 1. No acute intracranial abnormality. 2. No acute displaced fracture or traumatic listhesis of the cervical spine. Electronically Signed   By: Clelia Croft.D.  On: 10/05/2022 01:44   CT Cervical Spine Wo Contrast  Result Date: 10/05/2022 CLINICAL DATA:  Head trauma, minor (Age >= 65y); Neck trauma (Age >= 65y) EXAM: CT HEAD WITHOUT CONTRAST CT CERVICAL SPINE WITHOUT CONTRAST TECHNIQUE: Multidetector CT imaging of the head and cervical spine was performed following the standard protocol without intravenous contrast. Multiplanar CT image reconstructions of the cervical spine were also generated. RADIATION DOSE  REDUCTION: This exam was performed according to the departmental dose-optimization program which includes automated exposure control, adjustment of the mA and/or kV according to patient size and/or use of iterative reconstruction technique. COMPARISON:  CT head 03/09/2022 FINDINGS: CT HEAD FINDINGS Brain: Cerebral ventricle sizes are concordant with the degree of cerebral volume loss. Patchy and confluent areas of decreased attenuation are noted throughout the deep and periventricular white matter of the cerebral hemispheres bilaterally, compatible with chronic microvascular ischemic disease. no evidence of large-territorial acute infarction. No parenchymal hemorrhage. No mass lesion. No extra-axial collection. No mass effect or midline shift. No hydrocephalus. Basilar cisterns are patent. Vascular: No hyperdense vessel. Atherosclerotic calcifications are present within the cavernous internal carotid and vertebral arteries. Skull: No acute fracture or focal lesion. Sinuses/Orbits: Paranasal sinuses and mastoid air cells are clear. Bilateral lens replacement. Otherwise the orbits are unremarkable. Other: None. CT CERVICAL SPINE FINDINGS Alignment: Normal. Skull base and vertebrae: Mild multilevel degenerative changes of the spine. No acute fracture. No aggressive appearing focal osseous lesion or focal pathologic process. Soft tissues and spinal canal: No prevertebral fluid or swelling. No visible canal hematoma. Upper chest: Unremarkable. Other: Atherosclerotic plaque of the carotid arteries within the neck. IMPRESSION: 1. No acute intracranial abnormality. 2. No acute displaced fracture or traumatic listhesis of the cervical spine. Electronically Signed   By: Iven Finn M.D.   On: 10/05/2022 01:44   CT ABDOMEN PELVIS WO CONTRAST  Result Date: 10/05/2022 CLINICAL DATA:  Abdominal trauma, blunt on warfarin, fall several days ago, new anemia, concern for retroperitoneal hematoma EXAM: CT ABDOMEN AND PELVIS  WITHOUT CONTRAST TECHNIQUE: Multidetector CT imaging of the abdomen and pelvis was performed following the standard protocol without IV contrast. RADIATION DOSE REDUCTION: This exam was performed according to the departmental dose-optimization program which includes automated exposure control, adjustment of the mA and/or kV according to patient size and/or use of iterative reconstruction technique. COMPARISON:  02/12/2007 FINDINGS: Lower chest: Densely calcified coronary arteries and aorta. No acute abnormality. Hepatobiliary: Multiple large gallstones layering within the gallbladder. No biliary ductal dilatation or focal hepatic abnormality. No evidence of a hepatic injury. Pancreas: No focal abnormality or ductal dilatation. Spleen: Normal size.  No focal abnormality or evidence of injury. Adrenals/Urinary Tract: Few small nonobstructing left renal stones. No ureteral stones or hydronephrosis. Adrenal glands and urinary bladder unremarkable. Stomach/Bowel: Stomach, large and small bowel grossly unremarkable. Vascular/Lymphatic: Heavily calcified aorta and iliac vessels. No evidence of aneurysm or adenopathy. Reproductive: Uterus and adnexa unremarkable.  No mass. Other: No free fluid or free air. Musculoskeletal: No acute bony abnormality. No evidence of retroperitoneal hematoma. IMPRESSION: No acute findings in the abdomen or pelvis. No evidence of retroperitoneal hematoma. Cholelithiasis. Left nephrolithiasis. Aortoiliac atherosclerosis. Electronically Signed   By: Rolm Baptise M.D.   On: 10/05/2022 01:39   DG Chest 2 View  Result Date: 10/04/2022 CLINICAL DATA:  SOB EXAM: CHEST - 2 VIEW COMPARISON:  Chest x-ray 11/03/2019 FINDINGS: Poorly defined left heart border. Otherwise the heart and mediastinal contours are unchanged. Vascular clips overlie the mediastinum. Atherosclerotic plaque. Coronary artery stents.  Query developing lingular airspace opacity. Chronic coarsened markings with no overt pulmonary  edema. No pleural effusion. No pneumothorax. No acute osseous abnormality.  Intact sternotomy wires. IMPRESSION: 1. Query developing lingular airspace opacity. Followup PA and lateral chest X-ray is recommended in 3-4 weeks following therapy to ensure resolution and exclude underlying malignancy. 2.  Aortic Atherosclerosis (ICD10-I70.0). Electronically Signed   By: Iven Finn M.D.   On: 10/04/2022 20:00        Scheduled Meds:  sodium chloride   Intravenous Once   amiodarone  200 mg Oral Daily   vitamin C  500 mg Oral Daily   atorvastatin  80 mg Oral Daily   ezetimibe  10 mg Oral Daily   folic acid  1 mg Oral QPM   Ipratropium-Albuterol  1 puff Inhalation Q6H   multivitamin  1 tablet Oral Daily   multivitamin with minerals  1 tablet Oral Daily   warfarin  1.25 mg Oral ONCE-1600   Warfarin - Pharmacist Dosing Inpatient   Does not apply q1600   zinc sulfate  220 mg Oral Daily   Continuous Infusions:  [START ON 10/06/2022] remdesivir 100 mg in sodium chloride 0.9 % 100 mL IVPB       LOS: 0 days   Time spent= 35 mins    Anson Peddie Arsenio Loader, MD [facility]  If 7PM-7AM, please contact night-coverage  10/05/2022, 8:32 AM

## 2022-10-05 NOTE — ED Provider Notes (Signed)
[facility] EMERGENCY DEPARTMENT Provider Note   CSN: [id number] Arrival date & time: 10/04/22  1836     History  Chief Complaint  Patient presents with   leg infection    [name] is a 89 y.o. female.  The history is provided by the patient, the spouse and medical records.  [name] is a 89 y.o. female who presents to the Emergency Department complaining of leg pain.  She presents the emergency department for evaluation of right leg pain that started around 6 PM.  She complains of sudden onset redness and pain to the right leg.  History is assisted by her husband.  He states that they were both sick about a week and a half ago with upper respiratory symptoms.  She complains of persistent cough and shortness of breath, particularly worse when laying flat.  No associated fever, chest pain.  No hematochezia or melena.  Husband states that she did have a fall a few days ago where she landed on her bottom.  No definite head injury at that time.  She is on warfarin for mechanical valve and did just have her dosing decreased on Friday due to elevated level to 2.9.  Her INR goal is 2.5.  She has had issues with internal bleeding per husband secondary to warfarin coagulopathy.     Home Medications Prior to Admission medications   Medication Sig Start Date End Date Taking? Authorizing Provider  amiodarone (PACERONE) 200 MG tablet Take 1 tablet by mouth once daily 03/10/22   Jettie Booze, MD  atorvastatin (LIPITOR) 80 MG tablet Take 1 tablet by mouth once daily 05/12/22   Jettie Booze, MD  bevacizumab (AVASTIN) 1.25 mg/0.1 mL SOLN Place 1.25 mg into the left eye every 8 (eight) weeks.    [provider]  docusate sodium (COLACE) 100 MG capsule Take 1 capsule (100 mg total) by mouth 2 (two) times daily. 06/03/22   Newman Pies, MD  ezetimibe (ZETIA) 10 MG tablet Take 1 tablet by mouth once daily 02/03/22   Jettie Booze, MD   FeFum-FePo-FA-B Cmp-C-Zn-Mn-Cu (SE-TAN PLUS) 162-115.2-1 MG CAPS Take 1 capsule by mouth 2 (two) times daily.  05/22/15   [provider]  folic acid (FOLVITE) 157 MCG tablet Take 400 mcg by mouth every evening.     [provider]  furosemide (LASIX) 40 MG tablet Take 1 tablet (40 mg total) by mouth 2 (two) times daily as needed. 07/10/22   Jettie Booze, MD  isosorbide mononitrate (IMDUR) 60 MG 24 hr tablet Take 1 tablet by mouth once daily 05/07/22   Jettie Booze, MD  losartan (COZAAR) 50 MG tablet Take 50 mg by mouth daily.    [provider]  Multiple Vitamin (MULTIVITAMIN WITH MINERALS) TABS Take 1 tablet by mouth daily.    [provider]  Multiple Vitamins-Minerals (PRESERVISION AREDS 2 PO) Take 1 tablet by mouth 2 (two) times daily.     [provider]  nitroGLYCERIN (NITROSTAT) 0.4 MG SL tablet DISSOLVE ONE TABLET UNDER THE TONGUE EVERY 5 MINUTES AS NEEDED FOR CHEST PAIN.  DO NOT EXCEED A TOTAL OF 3 DOSES IN 15 MINUTES 02/14/20   Jettie Booze, MD  Omega-3 Fatty Acids (FISH OIL) 600 MG CAPS Take 1,200 mg by mouth in the morning and at bedtime.    [provider]  warfarin (JANTOVEN) 2.5 MG tablet TAKE 1/2 TABLET TO 1 TABLET BY MOUTH DAILY AS DIRECTED BY  [facility] 10/03/22   Jettie Booze, MD      Allergies    Darvon [propoxyphene], Lisinopril, Septra [sulfamethoxazole-trimethoprim], Warfarin and related, Trazodone and nefazodone, Zolpidem tartrate, and Penicillins    Review of Systems   Review of Systems  All other systems reviewed and are negative.   Physical Exam Updated Vital Signs BP (!) 149/135   Pulse 88   Temp 98.1 F (36.7 C) (Oral)   Resp (!) 28   Ht 5' (1.524 m)   Wt 68.6 kg   SpO2 97%   BMI 29.54 kg/m  Physical Exam Vitals and nursing note reviewed.  Constitutional:      Appearance: She is well-developed. She is ill-appearing.  HENT:     Head: Normocephalic and atraumatic.   Cardiovascular:     Rate and Rhythm: Normal rate and regular rhythm.     Heart sounds: Murmur heard.     Comments: Mechanical click. Pulmonary:     Effort: Pulmonary effort is normal. No respiratory distress.     Comments: Decreased air movement in the left lung base, crackles in the right lung base Abdominal:     Palpations: Abdomen is soft.     Tenderness: There is no abdominal tenderness. There is no guarding or rebound.  Genitourinary:    Comments: No gross blood on DRE, empty rectal vault Musculoskeletal:        General: No tenderness.     Comments: 3+ pitting edema to bilateral lower extremities.  There are faint pedal pulses bilaterally.  There is erythema involving the right calf and shin.  Skin:    General: Skin is warm and dry.     Coloration: Skin is pale.  Neurological:     Mental Status: She is alert and oriented to person, place, and time.  Psychiatric:        Behavior: Behavior normal.     ED Results / Procedures / Treatments   Labs (all labs ordered are listed, but only abnormal results are displayed) Labs Reviewed  CBC WITH DIFFERENTIAL/PLATELET - Abnormal; Notable for the following components:      Result Value   RBC 2.31 (*)    Hemoglobin 7.3 (*)    HCT 25.3 (*)    MCV 109.5 (*)    MCHC 28.9 (*)    RDW 15.8 (*)    Lymphs Abs 0.6 (*)    Monocytes Absolute 0.0 (*)    nRBC 1 (*)    All other components within normal limits  COMPREHENSIVE METABOLIC PANEL - Abnormal; Notable for the following components:   Sodium 134 (*)    CO2 21 (*)    Glucose, Bld 128 (*)    BUN 29 (*)    Creatinine, Ser 1.68 (*)    Calcium 8.4 (*)    Total Protein 6.0 (*)    Albumin 3.3 (*)    AST 69 (*)    ALT 51 (*)    GFR, Estimated 29 (*)    All other components within normal limits  LACTIC ACID, PLASMA - Abnormal; Notable for the following components:   Lactic Acid, Venous 2.0 (*)    All other components within normal limits  BRAIN NATRIURETIC PEPTIDE - Abnormal;  Notable for the following components:   B Natriuretic Peptide 236.3 (*)    All other components within normal limits  PROTIME-INR - Abnormal; Notable for the following components:   Prothrombin Time 25.9 (*)    INR 2.4 (*)    All other components  within normal limits  RESP PANEL BY RT-PCR (RSV, FLU A&B, COVID)  [id number]  LACTIC ACID, PLASMA  URINALYSIS, ROUTINE W REFLEX MICROSCOPIC  TYPE AND SCREEN  PREPARE RBC (CROSSMATCH)  TROPONIN I (HIGH SENSITIVITY)  TROPONIN I (HIGH SENSITIVITY)    EKG EKG Interpretation  Date/Time:  Saturday October 04 2022 19:14:44 EST Ventricular Rate:  96 PR Interval:    QRS Duration: 130 QT Interval:  396 QTC Calculation: 500 R Axis:   -56 Text Interpretation: Atrial fibrillation Left axis deviation Non-specific intra-ventricular conduction block Cannot rule out Septal infarct , age undetermined T wave abnormality, consider lateral ischemia Abnormal ECG Confirmed by Quintella Reichert [phone]) on 10/05/2022 12:39:02 AM  Radiology DG Tibia/Fibula Right  Result Date: 10/05/2022 CLINICAL DATA:  Right lower extremity infection. EXAM: RIGHT TIBIA AND FIBULA - 2 VIEW COMPARISON:  Right ankle series AP Lat 12/01/2019, right knee AP Lat 12/01/2019 FINDINGS: There is diffuse edema, moderate in degree. Numerous surgical clips of the posteromedial upper foreleg. This was seen previously. The popliteal trifurcation arteries are heavily calcified. No new foreign body is seen. No soft tissue gas. There is osteopenia without evidence of fractures or destructive bone lesions. Mild degenerative arthrosis is noted of the knee and ankle and small plantar and dorsal calcaneal enthesopathic spurs. IMPRESSION: 1. Diffuse edema without evidence of soft tissue gas or new foreign body. 2. Osteopenia without evidence of fractures or lytic bone lesion. 3. Vascular calcifications. Electronically Signed   By: Telford Nab M.D.   On: 10/05/2022 02:07   CT Head Wo Contrast  Result Date:  10/05/2022 CLINICAL DATA:  Head trauma, minor (Age >= 65y); Neck trauma (Age >= 65y) EXAM: CT HEAD WITHOUT CONTRAST CT CERVICAL SPINE WITHOUT CONTRAST TECHNIQUE: Multidetector CT imaging of the head and cervical spine was performed following the standard protocol without intravenous contrast. Multiplanar CT image reconstructions of the cervical spine were also generated. RADIATION DOSE REDUCTION: This exam was performed according to the departmental dose-optimization program which includes automated exposure control, adjustment of the mA and/or kV according to patient size and/or use of iterative reconstruction technique. COMPARISON:  CT head 03/09/2022 FINDINGS: CT HEAD FINDINGS Brain: Cerebral ventricle sizes are concordant with the degree of cerebral volume loss. Patchy and confluent areas of decreased attenuation are noted throughout the deep and periventricular white matter of the cerebral hemispheres bilaterally, compatible with chronic microvascular ischemic disease. no evidence of large-territorial acute infarction. No parenchymal hemorrhage. No mass lesion. No extra-axial collection. No mass effect or midline shift. No hydrocephalus. Basilar cisterns are patent. Vascular: No hyperdense vessel. Atherosclerotic calcifications are present within the cavernous internal carotid and vertebral arteries. Skull: No acute fracture or focal lesion. Sinuses/Orbits: Paranasal sinuses and mastoid air cells are clear. Bilateral lens replacement. Otherwise the orbits are unremarkable. Other: None. CT CERVICAL SPINE FINDINGS Alignment: Normal. Skull base and vertebrae: Mild multilevel degenerative changes of the spine. No acute fracture. No aggressive appearing focal osseous lesion or focal pathologic process. Soft tissues and spinal canal: No prevertebral fluid or swelling. No visible canal hematoma. Upper chest: Unremarkable. Other: Atherosclerotic plaque of the carotid arteries within the neck. IMPRESSION: 1. No acute  intracranial abnormality. 2. No acute displaced fracture or traumatic listhesis of the cervical spine. Electronically Signed   By: Iven Finn M.D.   On: 10/05/2022 01:44   CT Cervical Spine Wo Contrast  Result Date: 10/05/2022 CLINICAL DATA:  Head trauma, minor (Age >= 65y); Neck trauma (Age >= 65y) EXAM: CT HEAD WITHOUT CONTRAST CT CERVICAL  SPINE WITHOUT CONTRAST TECHNIQUE: Multidetector CT imaging of the head and cervical spine was performed following the standard protocol without intravenous contrast. Multiplanar CT image reconstructions of the cervical spine were also generated. RADIATION DOSE REDUCTION: This exam was performed according to the departmental dose-optimization program which includes automated exposure control, adjustment of the mA and/or kV according to patient size and/or use of iterative reconstruction technique. COMPARISON:  CT head 03/09/2022 FINDINGS: CT HEAD FINDINGS Brain: Cerebral ventricle sizes are concordant with the degree of cerebral volume loss. Patchy and confluent areas of decreased attenuation are noted throughout the deep and periventricular white matter of the cerebral hemispheres bilaterally, compatible with chronic microvascular ischemic disease. no evidence of large-territorial acute infarction. No parenchymal hemorrhage. No mass lesion. No extra-axial collection. No mass effect or midline shift. No hydrocephalus. Basilar cisterns are patent. Vascular: No hyperdense vessel. Atherosclerotic calcifications are present within the cavernous internal carotid and vertebral arteries. Skull: No acute fracture or focal lesion. Sinuses/Orbits: Paranasal sinuses and mastoid air cells are clear. Bilateral lens replacement. Otherwise the orbits are unremarkable. Other: None. CT CERVICAL SPINE FINDINGS Alignment: Normal. Skull base and vertebrae: Mild multilevel degenerative changes of the spine. No acute fracture. No aggressive appearing focal osseous lesion or focal pathologic  process. Soft tissues and spinal canal: No prevertebral fluid or swelling. No visible canal hematoma. Upper chest: Unremarkable. Other: Atherosclerotic plaque of the carotid arteries within the neck. IMPRESSION: 1. No acute intracranial abnormality. 2. No acute displaced fracture or traumatic listhesis of the cervical spine. Electronically Signed   By: Iven Finn M.D.   On: 10/05/2022 01:44   CT ABDOMEN PELVIS WO CONTRAST  Result Date: 10/05/2022 CLINICAL DATA:  Abdominal trauma, blunt on warfarin, fall several days ago, new anemia, concern for retroperitoneal hematoma EXAM: CT ABDOMEN AND PELVIS WITHOUT CONTRAST TECHNIQUE: Multidetector CT imaging of the abdomen and pelvis was performed following the standard protocol without IV contrast. RADIATION DOSE REDUCTION: This exam was performed according to the departmental dose-optimization program which includes automated exposure control, adjustment of the mA and/or kV according to patient size and/or use of iterative reconstruction technique. COMPARISON:  02/12/2007 FINDINGS: Lower chest: Densely calcified coronary arteries and aorta. No acute abnormality. Hepatobiliary: Multiple large gallstones layering within the gallbladder. No biliary ductal dilatation or focal hepatic abnormality. No evidence of a hepatic injury. Pancreas: No focal abnormality or ductal dilatation. Spleen: Normal size.  No focal abnormality or evidence of injury. Adrenals/Urinary Tract: Few small nonobstructing left renal stones. No ureteral stones or hydronephrosis. Adrenal glands and urinary bladder unremarkable. Stomach/Bowel: Stomach, large and small bowel grossly unremarkable. Vascular/Lymphatic: Heavily calcified aorta and iliac vessels. No evidence of aneurysm or adenopathy. Reproductive: Uterus and adnexa unremarkable.  No mass. Other: No free fluid or free air. Musculoskeletal: No acute bony abnormality. No evidence of retroperitoneal hematoma. IMPRESSION: No acute findings in  the abdomen or pelvis. No evidence of retroperitoneal hematoma. Cholelithiasis. Left nephrolithiasis. Aortoiliac atherosclerosis. Electronically Signed   By: Rolm Baptise M.D.   On: 10/05/2022 01:39   DG Chest 2 View  Result Date: 10/04/2022 CLINICAL DATA:  SOB EXAM: CHEST - 2 VIEW COMPARISON:  Chest x-ray 11/03/2019 FINDINGS: Poorly defined left heart border. Otherwise the heart and mediastinal contours are unchanged. Vascular clips overlie the mediastinum. Atherosclerotic plaque. Coronary artery stents. Query developing lingular airspace opacity. Chronic coarsened markings with no overt pulmonary edema. No pleural effusion. No pneumothorax. No acute osseous abnormality.  Intact sternotomy wires. IMPRESSION: 1. Query developing lingular airspace opacity. Followup PA  and lateral chest X-ray is recommended in 3-4 weeks following therapy to ensure resolution and exclude underlying malignancy. 2.  Aortic Atherosclerosis (ICD10-I70.0). Electronically Signed   By: Iven Finn M.D.   On: 10/04/2022 20:00    Procedures .Critical Care  Performed by: Quintella Reichert, MD Authorized by: Quintella Reichert, MD   Critical care provider statement:    Critical care time (minutes):  40   Critical care was necessary to treat or prevent imminent or life-threatening deterioration of the following conditions:  Respiratory failure and cardiac failure   Critical care was time spent personally by me on the following activities:  Development of treatment plan with patient or surrogate, discussions with consultants, evaluation of patient's response to treatment, examination of patient, ordering and review of laboratory studies, ordering and review of radiographic studies, ordering and performing treatments and interventions, pulse oximetry, re-evaluation of patient's condition, review of old charts and obtaining history from patient or surrogate   Care discussed with: admitting provider       Medications Ordered in  ED Medications  0.9 %  sodium chloride infusion (Manually program via Guardrails IV Fluids) (has no administration in time range)  furosemide (LASIX) injection 40 mg (has no administration in time range)  vancomycin (VANCOCIN) IVPB 1000 mg/200 mL premix (1,000 mg Intravenous New Bag/Given 10/05/22 0354)    ED Course/ Medical Decision Making/ A&P                             Medical Decision Making Amount and/or Complexity of Data Reviewed Labs: ordered. Radiology: ordered.  Risk Prescription drug management. Decision regarding hospitalization.   Patient with history of CKD, diabetes, hypertension, paroxysmal atrial fibrillation, small bowel AVMs, CHF, CABG with mechanical aortic valve replacement on warfarin here for evaluation of right leg pain and redness.  On examination she has good perfusion of the legs with erythema and tenderness throughout the right lower leg.  She is volume overloaded on examination with frequent cough, orthopnea and significant lower extremity edema.  Labs significant for progressive anemia, no evidence of active bleeding on examination.  Her INR is appropriate at 2.4.  Patient consented for blood transfusion for symptomatic anemia and she was treated with vancomycin for cellulitis of the right lower extremity.  She was also treated with furosemide for diuresis as patient is volume overloaded on examination.  She did have a recent fall at home given her anticoagulation and recent fall CT scans were obtained to rule out intracranial injury or retroperitoneal bleed.  CT scans without acute abnormalities.  Plan to admit to the medicine service for ongoing treatment for cellulitis, symptomatic anemia.        Final Clinical Impression(s) / ED Diagnoses Final diagnoses:  Symptomatic anemia  Cellulitis of right lower extremity    Rx / DC Orders ED Discharge Orders     None         Quintella Reichert, MD 10/05/22 [phone]

## 2022-10-06 ENCOUNTER — Other Ambulatory Visit: Payer: Self-pay

## 2022-10-06 ENCOUNTER — Encounter (HOSPITAL_COMMUNITY): Payer: Self-pay | Admitting: Internal Medicine

## 2022-10-06 DIAGNOSIS — L03115 Cellulitis of right lower limb: Secondary | ICD-10-CM | POA: Diagnosis not present

## 2022-10-06 LAB — GLUCOSE, CAPILLARY
Glucose-Capillary: 94 mg/dL (ref 70–99)
Glucose-Capillary: 127 mg/dL — ABNORMAL HIGH (ref 70–99)
Glucose-Capillary: 103 mg/dL — ABNORMAL HIGH (ref 70–99)
Glucose-Capillary: 145 mg/dL — ABNORMAL HIGH (ref 70–99)

## 2022-10-06 LAB — BASIC METABOLIC PANEL
Anion gap: 6 (ref 5–15)
BUN: 29 mg/dL — ABNORMAL HIGH (ref 8–23)
CO2: 24 mmol/L (ref 22–32)
Calcium: 8 mg/dL — ABNORMAL LOW (ref 8.9–10.3)
Chloride: 103 mmol/L (ref 98–111)
Creatinine, Ser: 1.64 mg/dL — ABNORMAL HIGH (ref 0.44–1.00)
GFR, Estimated: 30 mL/min — ABNORMAL LOW (ref >60)
Glucose, Bld: 102 mg/dL — ABNORMAL HIGH (ref 70–99)
Potassium: 3.5 mmol/L (ref 3.5–5.1)
Sodium: 133 mmol/L — ABNORMAL LOW (ref 135–145)

## 2022-10-06 LAB — MAGNESIUM: Magnesium: 2.2 mg/dL (ref 1.7–2.4)

## 2022-10-06 LAB — FOLATE: Folate: 12 ng/mL (ref >5.9)

## 2022-10-06 LAB — CBC
HCT: 23.2 % — ABNORMAL LOW (ref 36.0–46.0)
Hemoglobin: 7.4 g/dL — ABNORMAL LOW (ref 12.0–15.0)
MCH: 32.9 pg (ref 26.0–34.0)
MCHC: 31.9 g/dL (ref 30.0–36.0)
MCV: 103.1 fL — ABNORMAL HIGH (ref 80.0–100.0)
Platelets: 193 10*3/uL (ref 150–400)
RBC: 2.25 MIL/uL — ABNORMAL LOW (ref 3.87–5.11)
RDW: 18 % — ABNORMAL HIGH (ref 11.5–15.5)
WBC: 9.8 10*3/uL (ref 4.0–10.5)
nRBC: 0.3 % — ABNORMAL HIGH (ref 0.0–0.2)

## 2022-10-06 LAB — PROTIME-INR
INR: 2.5 — ABNORMAL HIGH (ref 0.8–1.2)
Prothrombin Time: 26.6 seconds — ABNORMAL HIGH (ref 11.4–15.2)

## 2022-10-06 MED ORDER — WARFARIN 1.25 MG HALF TABLET
1.2500 mg | ORAL_TABLET | Freq: Once | ORAL | Status: AC
  Administered 2022-10-06: 1.25 mg via ORAL
  Filled 2022-10-06: qty 1

## 2022-10-06 MED ORDER — IPRATROPIUM-ALBUTEROL 20-100 MCG/ACT IN AERS
1.0000 | INHALATION_SPRAY | Freq: Four times a day (QID) | RESPIRATORY_TRACT | Status: DC | PRN
  Administered 2022-10-07 – 2022-10-08 (×2): 1 via RESPIRATORY_TRACT
  Filled 2022-10-06 (×3): qty 4

## 2022-10-06 MED ORDER — ORAL CARE MOUTH RINSE: 15.0000 mL | OROMUCOSAL | Status: DC | PRN

## 2022-10-06 NOTE — Evaluation (Signed)
Occupational Therapy Evaluation Patient Details Name: [name] MRN: [id number] DOB: 1932-09-23 Today's Date: 10/06/2022   History of Present Illness [name] is a 89 y.o. female who presented to [facility] ED due to right leg pain, swelling, redness and warmth.  Endorses that she fell few days ago.  She is unsure if she hit her right leg.  She noticed the redness and the pain yesterday around 6 PM and it rapidly progressed; covid + as well; with medical history significant for coronary artery disease status post CABG, aortic valve disease status post mechanical AVR, paroxysmal A-fib, on Coumadin, hypertension, hyperlipidemia, type 2 diabetes, history of GI bleed, CKD 3B, chronic diastolic CHF,   Clinical Impression   Patient admitted for the diagnosis above.  PTA she lives at home with her spouse, who does provide ADL support.  Currently the patient is needing up to Mod A for basic mobility and lower body ADL.  OT is indicated in the acute setting to address the deficits listed below, and assist with an eventual transition to the next level of care.  SNF is recommended for short term rehab, but if the spouse can provide the needed assist, [facility] OT may work.        Recommendations for follow up therapy are one component of a multi-disciplinary discharge planning process, led by the attending physician.  Recommendations may be updated based on patient status, additional functional criteria and insurance authorization.   Follow Up Recommendations  Other (comment) (Agree with PT, SNF would be beneficial, but patient not willing.  Can consider HH OT if spouse can provide needed Mod A.)     Assistance Recommended at Discharge Frequent or constant Supervision/Assistance  Patient can return home with the following Assist for transportation;Assistance with cooking/housework;Help with stairs or ramp for entrance;A lot of help with walking and/or transfers;A lot of help with bathing/dressing/bathroom     Functional Status Assessment  Patient has had a recent decline in their functional status and demonstrates the ability to make significant improvements in function in a reasonable and predictable amount of time.  Equipment Recommendations  BSC/3in1    Recommendations for Other Services       Precautions / Restrictions Precautions Precautions: Fall Precaution Comments: covid Restrictions Weight Bearing Restrictions: No      Mobility Bed Mobility Overal bed mobility: Needs Assistance Bed Mobility: Supine to Sit     Supine to sit: Mod assist     General bed mobility comments: difficulty advancing hips and heavy lean R initially    Transfers Overall transfer level: Needs assistance Equipment used: Rolling walker (2 wheels) Transfers: Sit to/from Stand, Bed to chair/wheelchair/BSC Sit to Stand: Mod assist     Step pivot transfers: Min assist, Mod assist            Balance Overall balance assessment: Needs assistance Sitting-balance support: Feet supported Sitting balance-Leahy Scale: Fair   Postural control: Right lateral lean Standing balance support: Reliant on assistive device for balance Standing balance-Leahy Scale: Poor                             ADL either performed or assessed with clinical judgement   ADL Overall ADL's : Needs assistance/impaired Eating/Feeding: Set up;Bed level   Grooming: Wash/dry hands;Wash/dry face;Oral care;Set up;Sitting   Upper Body Bathing: Minimal assistance;Sitting   Lower Body Bathing: Moderate assistance;Sit to/from stand   Upper Body Dressing : Minimal assistance;Sitting   Lower  Body Dressing: Moderate assistance;Sit to/from stand   Toilet Transfer: Minimal assistance;Moderate assistance;BSC/3in1;Stand-pivot                   Vision Baseline Vision/History: 1 Wears glasses Patient Visual Report: No change from baseline       Perception     Praxis      Pertinent Vitals/Pain Pain  Assessment Pain Assessment: Faces Faces Pain Scale: Hurts a little bit Pain Location: R lower leg Pain Descriptors / Indicators: Tender Pain Intervention(s): Monitored during session     Hand Dominance Right   Extremity/Trunk Assessment Upper Extremity Assessment Upper Extremity Assessment: Generalized weakness   Lower Extremity Assessment Lower Extremity Assessment: Defer to PT evaluation   Cervical / Trunk Assessment Cervical / Trunk Assessment: Kyphotic   Communication Communication Communication: HOH   Cognition Arousal/Alertness: Awake/alert Behavior During Therapy: WFL for tasks assessed/performed Overall Cognitive Status: History of cognitive impairments - at baseline                                 General Comments: ST memory deficits noted     General Comments       Exercises     Shoulder Instructions      Home Living Family/patient expects to be discharged to:: Private residence Living Arrangements: Spouse/significant other Available Help at Discharge: Family;Available 24 hours/day Type of Home: House Home Access: Stairs to enter [employer] of Steps: 3 Entrance Stairs-Rails: Right Home Layout: One level     Bathroom Shower/Tub: Occupational psychologist: Standard Bathroom Accessibility: Yes How Accessible: Accessible via walker Home Equipment: Rolling Walker (2 wheels);Grab bars - tub/shower;Shower seat;Cane - single point;Wheelchair - manual;Hand held shower head;Adaptive equipment          Prior Functioning/Environment               Mobility Comments: Per Pt eval:  Has been sleeping in recliner; Difficulty getting to stand; very limited walking, esp in few days leading up to admission ADLs Comments: Verified from PT eval: Tends to sink bathe wth assist; infrequent showers with husband assist        OT Problem List: Decreased strength;Decreased activity tolerance;Impaired balance (sitting and/or  standing);Decreased safety awareness;Pain;Increased edema      OT Treatment/Interventions: Self-care/ADL training;Therapeutic exercise;Therapeutic activities;Patient/family education;Balance training    OT Goals(Current goals can be found in the care plan section) Acute Rehab OT Goals Patient Stated Goal: Return home OT Goal Formulation: With patient Time For Goal Achievement: 10/20/22 Potential to Achieve Goals: Good ADL Goals Pt Will Perform Grooming: with set-up;standing Pt Will Perform Upper Body Dressing: Independently;sitting Pt Will Perform Lower Body Dressing: with min guard assist;sit to/from stand Pt Will Transfer to Toilet: with min guard assist;ambulating;regular height toilet Pt/caregiver will Perform Home Exercise Program: Increased strength;Both right and left upper extremity;With Supervision  OT Frequency: Min 2X/week    Co-evaluation              AM-PAC OT "6 Clicks" Daily Activity     Outcome Measure Help from another person eating meals?: None Help from another person taking care of personal grooming?: None Help from another person toileting, which includes using toliet, bedpan, or urinal?: A Lot Help from another person bathing (including washing, rinsing, drying)?: A Lot Help from another person to put on and taking off regular upper body clothing?: A Little Help from another person to put on and taking off  regular lower body clothing?: A Lot 6 Click Score: 17   End of Session Equipment Utilized During Treatment: Rolling walker (2 wheels) Nurse Communication: Mobility status  Activity Tolerance: Patient tolerated treatment well Patient left: in bed;with call bell/phone within reach;with chair alarm set  OT Visit Diagnosis: Unsteadiness on feet (R26.81);Muscle weakness (generalized) (M62.81);Pain Pain - Right/Left: Right Pain - part of body: Leg                Time: 4315-4008 OT Time Calculation (min): 23 min Charges:  OT General Charges $OT Visit:  1 Visit OT Evaluation $OT Eval Moderate Complexity: 1 Mod OT Treatments $Self Care/Home Management : 8-22 mins  10/06/2022  RP, OTR/L  Acute Rehabilitation Services  Office:  [phone]   Metta Clines 10/06/2022, 10:06 AM

## 2022-10-06 NOTE — Progress Notes (Signed)
ANTICOAGULATION CONSULT NOTE  Pharmacy Consult for Warfarin Indication:  mechanical valve  Allergies  Allergen Reactions   Darvon [Propoxyphene] Nausea And Vomiting   Lisinopril Cough   Septra [Sulfamethoxazole-Trimethoprim] Other (See Comments)    Increased INR   Warfarin And Related Other (See Comments)    ONLY TOLERATES BRAND   Trazodone And Nefazodone     Severe sweating   Zolpidem Tartrate     Hallucinations   Penicillins Rash    Did it involve swelling of the face/tongue/throat, SOB, or low BP? No Did it involve sudden or severe rash/hives, skin peeling, or any reaction on the inside of your mouth or nose? No Did you need to seek medical attention at a hospital or doctor's office? No When did it last happen?  15 years     If all above answers are "NO", may proceed with cephalosporin use.    Patient Measurements: Height: 5' (152.4 cm) Weight: 68.6 kg (151 lb 3.8 oz) IBW/kg (Calculated) : 45.5  Vital Signs: Temp: 98.4 F (36.9 C) (01/15 0946) Temp Source: Oral (01/15 0409) BP: 92/67 (01/15 0946) Pulse Rate: 94 (01/15 0946)  Labs: Recent Labs    10/04/22 1908 10/04/22 2145 10/05/22 0315 10/05/22 0608 10/06/22 0702 10/06/22 1241  HGB 7.3*  --   --  7.6* 7.4*  --   HCT 25.3*  --   --  24.8* 23.2*  --   PLT 228  --   --  202 193  --   LABPROT  --   --  25.9*  --   --  26.6*  INR  --   --  2.4*  --   --  2.5*  CREATININE 1.68*  --   --  1.68* 1.64*  --   TROPONINIHS 13 13  --   --   --   --     Estimated Creatinine Clearance: 20.9 mL/min (A) (by C-G formula based on SCr of 1.64 mg/dL (H)).   Assessment: 89 YO female with history of mechanical AVR and Afib on warfarin PTA who is admitted for right leg pain with concerns for cellulitis. Also has remote history of GIB. Due to this, goal INR lower than normal. INR therapeutic at 1.5. No issues with bleeding reported. Hgb low stable 7.4, platelets 193.   PTA regimen: 1.25 mg daily except 2.5 mg Tue and Thu     Goal of Therapy:  INR 2.0-2.5 (history of GIB 01/2013) Monitor platelets by anticoagulation protocol: Yes   Plan:  Give warfarin 1.25 mg PO x1 dose Check INR daily while on warfarin Continue to monitor H&H and platelets  Thank you for allowing pharmacy to be a part of this patient's care.  Ardyth Harps, PharmD Clinical Pharmacist

## 2022-10-06 NOTE — Progress Notes (Signed)
   10/05/22 2105  Assess: MEWS Score  Temp 98.5 F (36.9 C)  BP 120/60  MAP (mmHg) 80  Pulse Rate 88  Resp (!) 28  Level of Consciousness Alert  SpO2 96 %  O2 Device Room Air  Patient Activity (if Appropriate) In bed  Assess: MEWS Score  MEWS Temp 0  MEWS Systolic 0  MEWS Pulse 0  MEWS RR 2  MEWS LOC 0  MEWS Score 2  MEWS Score Color Yellow  Assess: if the MEWS score is Yellow or Red  Were vital signs taken at a resting state? Yes  Focused Assessment Change from prior assessment (see assessment flowsheet)  Does the patient meet 2 or more of the SIRS criteria? Yes  Does the patient have a confirmed or suspected source of infection? Yes  Provider and Rapid Response Notified? Yes  MEWS guidelines implemented *See Row Information* Yes  Treat  Pain Scale 0-10  Pain Score 0  Take Vital Signs  Increase Vital Sign Frequency  Yellow: Q 2hr X 2 then Q 4hr X 2, if remains yellow, continue Q 4hrs  Escalate  MEWS: Escalate Yellow: discuss with charge nurse/RN and consider discussing with provider and RRT  Notify: Charge Nurse/RN  Name of Charge Nurse/RN Notified Sima Matas RN  Date Charge Nurse/RN Notified 10/06/22  Provider Notification  Provider Name/Title Dr. Myna Hidalgo  Date Provider Notified 10/05/22  Method of Notification Page  Notification Reason Change in status  Provider response See new orders  Date of Provider Response 10/05/22  Document  Patient Outcome Stabilized after interventions  Progress note created (see row info) Yes  Assess: SIRS CRITERIA  SIRS Temperature  0  SIRS Pulse 0  SIRS Respirations  1  SIRS WBC 0  SIRS Score Sum  1   Respiratory rate increased to 28 respirations per minute.  No respiratory distress.  MD made aware.  Will continue to monitor patient.  Earleen Reaper RN

## 2022-10-06 NOTE — Progress Notes (Signed)
PROGRESS NOTE    [name]  GDJ:[id number] DOB: 1933/04/10 DOA: 10/04/2022 PCP: Gaynelle Arabian, MD   Brief Narrative:  89 year old with history of CAD status post CABG, aortic valve disease status post mechanical AVR, paroxysmal A-fib on Coumadin, HTN, HLD, DM2, GI bleed, CKD stage IIIb, diastolic CHF comes to the ED with complaints of right lower extremity pain, swelling and erythema.  There was concerns of volume overload cellulitis therefore admitted the patient.  Screening for COVID-19 was also positive.  Due to signs of anemia, patient was given 1 unit of PRBC   Assessment & Plan:  Principal Problem:   Cellulitis     Right lower extremity cellulitis, POA Necrotizing fasciitis suspected X-ray showing diffuse edema.  CT scan right lower extremity does not show any evidence of deep infection.  Follow-up culture data.  Pain control, bowel regimen.  On IV vancomycin.   COVID-19 viral infection Chest x-ray showing mild lingular opacity, upon admission started on 3 days of IV remdesivir.  Blood loss anemia, iron deficiency History of gastric ulcers No obvious signs of bleeding.  Baseline hemoglobin 11, admission hemoglobin 7.3, received 1 unit of PRBC  Endoscopy given March 2021 showed nonbleeding gastric ulcer.  Patient is on Coumadin for mechanical valve.  Briefly discussed case with Dr. Benson Norway from GI who agrees to proceed with conservative management at this time and continuing Coumadin. Iron studies showed low saturation and borderline low ferritin.  Received a dose of IV iron.  Continue PPI twice daily  CAD status post CABG in 1990s Status post PCI 2016 -Currently chest pain-free.  Continue home medications   Prolonged QTc Continue to monitor   Paroxysmal A-fib, on Coumadin Mechanical aortic valve Has mechanical valve in place, INR therapeutic.   Generalized weakness PT/OT  Essential hypertension -Resume home meds.  IV as needed ordered  Diabetes mellitus type  2 -Sliding scale and Accu-Cheks   Hyperlipidemia Resume home regimen.   Chronic diastolic CHF Euvolemic on exam, BNP 236 Strict I's and O's and daily weight      DVT prophylaxis: Coumadin Code Status: Full code Family Communication:    Status is: Inpatient Maintain hospital stay for IV antibiotics and acute blood loss anemia management   Subjective: Sitting up in the recliner, still coughing.  Examination: Constitutional: Not in acute distress, Respiratory: mild b/l rhonchi Cardiovascular: Normal sinus rhythm, no rubs Abdomen: Nontender nondistended good bowel sounds Musculoskeletal: No edema noted Skin: RLE erythema Neurologic: CN 2-12 grossly intact.  And nonfocal Psychiatric: Normal judgment and insight. Alert and oriented x 3. Normal mood.     Objective: Vitals:   10/05/22 2302 10/06/22 0239 10/06/22 0409 10/06/22 0946  BP: 115/89 (!) 109/59 (!) 92/54 92/67  Pulse: 83 77 88 94  Resp: '20 20 18 18  '$ Temp: 98.4 F (36.9 C) 97.7 F (36.5 C) 98 F (36.7 C) 98.4 F (36.9 C)  TempSrc: Oral Oral Oral   SpO2: 98% 96% 99% 100%  Weight:      Height:        Intake/Output Summary (Last 24 hours) at 10/06/2022 1130 Last data filed at 10/06/2022 0800 Gross per 24 hour  Intake 1060 ml  Output 452 ml  Net 608 ml   Filed Weights   10/04/22 1850  Weight: 68.6 kg     Data Reviewed:   CBC: Recent Labs  Lab 10/04/22 1908 10/05/22 0608 10/06/22 0702  WBC 4.6 8.8 9.8  NEUTROABS 3.9  --   --   HGB 7.3*  7.6* 7.4*  HCT 25.3* 24.8* 23.2*  MCV 109.5* 104.2* 103.1*  PLT 228 202 616   Basic Metabolic Panel: Recent Labs  Lab 10/04/22 1908 10/05/22 0608 10/06/22 0702  NA 134* 133* 133*  K 4.0 3.6 3.5  CL 102 100 103  CO2 21* 20* 24  GLUCOSE 128* 135* 102*  BUN 29* 28* 29*  CREATININE 1.68* 1.68* 1.64*  CALCIUM 8.4* 8.1* 8.0*  MG  --  2.1 2.2  PHOS  --  3.7  --    GFR: Estimated Creatinine Clearance: 20.9 mL/min (A) (by C-G formula based on SCr of 1.64  mg/dL (H)). Liver Function Tests: Recent Labs  Lab 10/04/22 1908 10/05/22 0608  AST 69* 546*  ALT 51* 311*  ALKPHOS 45 43  BILITOT 0.9 1.8*  PROT 6.0* 5.4*  ALBUMIN 3.3* 2.7*   No results for input(s): "LIPASE", "AMYLASE" in the last 168 hours. No results for input(s): "AMMONIA" in the last 168 hours. Coagulation Profile: Recent Labs  Lab 10/03/22 1126 10/05/22 0315  INR 2.9 2.4*   Cardiac Enzymes: No results for input(s): "CKTOTAL", "CKMB", "CKMBINDEX", "TROPONINI" in the last 168 hours. BNP (last 3 results) No results for input(s): "PROBNP" in the last 8760 hours. HbA1C: No results for input(s): "HGBA1C" in the last 72 hours. CBG: Recent Labs  Lab 10/05/22 1217 10/05/22 1717 10/05/22 2126 10/06/22 0754  GLUCAP 122* 128* 153* 94   Lipid Profile: No results for input(s): "CHOL", "HDL", "LDLCALC", "TRIG", "CHOLHDL", "LDLDIRECT" in the last 72 hours. Thyroid Function Tests: No results for input(s): "TSH", "T4TOTAL", "FREET4", "T3FREE", "THYROIDAB" in the last 72 hours. Anemia Panel: Recent Labs    10/05/22 0900 10/06/22 0702  FOLATE  --  12.0  FERRITIN 96  --   TIBC 365  --   IRON 27*  --    Sepsis Labs: Recent Labs  Lab 10/04/22 1908 10/04/22 2145  LATICACIDVEN 2.0* 1.6    Recent Results (from the past 240 hour(s))  Resp panel by RT-PCR (RSV, Flu A&B, Covid) Anterior Nasal Swab     Status: Abnormal   Collection Time: 10/05/22  3:03 AM   Specimen: Anterior Nasal Swab  Result Value Ref Range Status   SARS Coronavirus 2 by RT PCR POSITIVE (A) NEGATIVE Final    Comment: (NOTE) SARS-CoV-2 target nucleic acids are DETECTED.  The SARS-CoV-2 RNA is generally detectable in upper respiratory specimens during the acute phase of infection. Positive results are indicative of the presence of the identified virus, but do not rule out bacterial infection or co-infection with other pathogens not detected by the test. Clinical correlation with patient history  and other diagnostic information is necessary to determine patient infection status. The expected result is Negative.  Fact Sheet for Patients: EntrepreneurPulse.com.au  Fact Sheet for Healthcare Providers: IncredibleEmployment.be  This test is not yet approved or cleared by the [place] FDA and  has been authorized for detection and/or diagnosis of SARS-CoV-2 by FDA under an Emergency Use Authorization (EUA).  This EUA will remain in effect (meaning this test can be used) for the duration of  the COVID-19 declaration under Section 564(b)(1) of the A ct, 21 U.S.C. section 360bbb-3(b)(1), unless the authorization is terminated or revoked sooner.     Influenza A by PCR NEGATIVE NEGATIVE Final   Influenza B by PCR NEGATIVE NEGATIVE Final    Comment: (NOTE) The Xpert Xpress SARS-CoV-2/FLU/RSV plus assay is intended as an aid in the diagnosis of influenza from Nasopharyngeal swab specimens and should not  be used as a sole basis for treatment. Nasal washings and aspirates are unacceptable for Xpert Xpress SARS-CoV-2/FLU/RSV testing.  Fact Sheet for Patients: EntrepreneurPulse.com.au  Fact Sheet for Healthcare Providers: IncredibleEmployment.be  This test is not yet approved or cleared by the [place] FDA and has been authorized for detection and/or diagnosis of SARS-CoV-2 by FDA under an Emergency Use Authorization (EUA). This EUA will remain in effect (meaning this test can be used) for the duration of the COVID-19 declaration under Section 564(b)(1) of the Act, 21 U.S.C. section 360bbb-3(b)(1), unless the authorization is terminated or revoked.     Resp Syncytial Virus by PCR NEGATIVE NEGATIVE Final    Comment: (NOTE) Fact Sheet for Patients: EntrepreneurPulse.com.au  Fact Sheet for Healthcare Providers: IncredibleEmployment.be  This test is not yet  approved or cleared by the [place] FDA and has been authorized for detection and/or diagnosis of SARS-CoV-2 by FDA under an Emergency Use Authorization (EUA). This EUA will remain in effect (meaning this test can be used) for the duration of the COVID-19 declaration under Section 564(b)(1) of the Act, 21 U.S.C. section 360bbb-3(b)(1), unless the authorization is terminated or revoked.  Performed at [facility], [place] [address], [place], [place] [address]   Culture, blood (Routine X 2) w Reflex to ID Panel     Status: None (Preliminary result)   Collection Time: 10/05/22 11:05 AM   Specimen: BLOOD  Result Value Ref Range Status   Specimen Description BLOOD LEFT ANTECUBITAL  Final   Special Requests   Final    BOTTLES DRAWN AEROBIC ONLY Blood Culture results may not be optimal due to an inadequate volume of blood received in culture bottles   Culture   Final    NO GROWTH < 24 HOURS Performed at [facility], [place] [address] [place], [place] [address]    Report Status PENDING  Incomplete  Culture, blood (Routine X 2) w Reflex to ID Panel     Status: None (Preliminary result)   Collection Time: 10/05/22 11:25 AM   Specimen: BLOOD RIGHT HAND  Result Value Ref Range Status   Specimen Description BLOOD RIGHT HAND  Final   Special Requests   Final    BOTTLES DRAWN AEROBIC ONLY Blood Culture results may not be optimal due to an inadequate volume of blood received in culture bottles   Culture   Final    NO GROWTH < 24 HOURS Performed at [facility], [place] [address], [place], [place] [address]    Report Status PENDING  Incomplete         Radiology Studies: CT EXTREMITY LOWER RIGHT WO CONTRAST  Result Date: 10/05/2022 CLINICAL DATA:  Redness and swelling of the right lower leg EXAM: CT OF THE LOWER RIGHT EXTREMITY WITHOUT CONTRAST TECHNIQUE: Multidetector CT imaging of the right lower extremity was performed according to the standard protocol. RADIATION DOSE  REDUCTION: This exam was performed according to the departmental dose-optimization program which includes automated exposure control, adjustment of the mA and/or kV according to patient size and/or use of iterative reconstruction technique. COMPARISON:  X-ray 10/05/2022 FINDINGS: Bones/Joint/Cartilage No acute fracture. No dislocation. No bone erosion periosteal elevation. Mild-to-moderate osteoarthritic changes at the knee and mild degenerative changes within the foot. Trace knee joint effusion. No tibiotalar or subtalar joint effusion. No lytic or sclerotic bone lesion. Ligaments Suboptimally assessed by CT. Muscles and Tendons No acute musculotendinous abnormality by CT. No intramuscular fluid collection. No appreciable tenosynovial fluid collection. Soft tissues  Circumferential subcutaneous edema and ill-defined fluid of the lower leg and foot. No organized or drainable fluid collection. No deep fascial fluid. No soft tissue gas. No discernible wound or ulceration. IMPRESSION: 1. Circumferential subcutaneous edema and ill-defined fluid of the lower leg and foot, nonspecific but may represent cellulitis in the appropriate clinical setting. No organized or drainable fluid collection. No deep fascial fluid. No soft tissue gas. 2. No acute osseous abnormality of the right lower extremity. 3. Mild-to-moderate osteoarthritic changes at the knee and mild degenerative changes within the foot. Trace knee joint effusion, likely degenerative. Electronically Signed   By: Davina Poke D.O.   On: 10/05/2022 09:08   DG Tibia/Fibula Right  Result Date: 10/05/2022 CLINICAL DATA:  Right lower extremity infection. EXAM: RIGHT TIBIA AND FIBULA - 2 VIEW COMPARISON:  Right ankle series AP Lat 12/01/2019, right knee AP Lat 12/01/2019 FINDINGS: There is diffuse edema, moderate in degree. Numerous surgical clips of the posteromedial upper foreleg. This was seen previously. The popliteal trifurcation arteries are heavily  calcified. No new foreign body is seen. No soft tissue gas. There is osteopenia without evidence of fractures or destructive bone lesions. Mild degenerative arthrosis is noted of the knee and ankle and small plantar and dorsal calcaneal enthesopathic spurs. IMPRESSION: 1. Diffuse edema without evidence of soft tissue gas or new foreign body. 2. Osteopenia without evidence of fractures or lytic bone lesion. 3. Vascular calcifications. Electronically Signed   By: Telford Nab M.D.   On: 10/05/2022 02:07   CT Head Wo Contrast  Result Date: 10/05/2022 CLINICAL DATA:  Head trauma, minor (Age >= 65y); Neck trauma (Age >= 65y) EXAM: CT HEAD WITHOUT CONTRAST CT CERVICAL SPINE WITHOUT CONTRAST TECHNIQUE: Multidetector CT imaging of the head and cervical spine was performed following the standard protocol without intravenous contrast. Multiplanar CT image reconstructions of the cervical spine were also generated. RADIATION DOSE REDUCTION: This exam was performed according to the departmental dose-optimization program which includes automated exposure control, adjustment of the mA and/or kV according to patient size and/or use of iterative reconstruction technique. COMPARISON:  CT head 03/09/2022 FINDINGS: CT HEAD FINDINGS Brain: Cerebral ventricle sizes are concordant with the degree of cerebral volume loss. Patchy and confluent areas of decreased attenuation are noted throughout the deep and periventricular white matter of the cerebral hemispheres bilaterally, compatible with chronic microvascular ischemic disease. no evidence of large-territorial acute infarction. No parenchymal hemorrhage. No mass lesion. No extra-axial collection. No mass effect or midline shift. No hydrocephalus. Basilar cisterns are patent. Vascular: No hyperdense vessel. Atherosclerotic calcifications are present within the cavernous internal carotid and vertebral arteries. Skull: No acute fracture or focal lesion. Sinuses/Orbits: Paranasal  sinuses and mastoid air cells are clear. Bilateral lens replacement. Otherwise the orbits are unremarkable. Other: None. CT CERVICAL SPINE FINDINGS Alignment: Normal. Skull base and vertebrae: Mild multilevel degenerative changes of the spine. No acute fracture. No aggressive appearing focal osseous lesion or focal pathologic process. Soft tissues and spinal canal: No prevertebral fluid or swelling. No visible canal hematoma. Upper chest: Unremarkable. Other: Atherosclerotic plaque of the carotid arteries within the neck. IMPRESSION: 1. No acute intracranial abnormality. 2. No acute displaced fracture or traumatic listhesis of the cervical spine. Electronically Signed   By: Iven Finn M.D.   On: 10/05/2022 01:44   CT Cervical Spine Wo Contrast  Result Date: 10/05/2022 CLINICAL DATA:  Head trauma, minor (Age >= 65y); Neck trauma (Age >= 65y) EXAM: CT HEAD WITHOUT CONTRAST CT CERVICAL SPINE WITHOUT CONTRAST TECHNIQUE:  Multidetector CT imaging of the head and cervical spine was performed following the standard protocol without intravenous contrast. Multiplanar CT image reconstructions of the cervical spine were also generated. RADIATION DOSE REDUCTION: This exam was performed according to the departmental dose-optimization program which includes automated exposure control, adjustment of the mA and/or kV according to patient size and/or use of iterative reconstruction technique. COMPARISON:  CT head 03/09/2022 FINDINGS: CT HEAD FINDINGS Brain: Cerebral ventricle sizes are concordant with the degree of cerebral volume loss. Patchy and confluent areas of decreased attenuation are noted throughout the deep and periventricular white matter of the cerebral hemispheres bilaterally, compatible with chronic microvascular ischemic disease. no evidence of large-territorial acute infarction. No parenchymal hemorrhage. No mass lesion. No extra-axial collection. No mass effect or midline shift. No hydrocephalus. Basilar  cisterns are patent. Vascular: No hyperdense vessel. Atherosclerotic calcifications are present within the cavernous internal carotid and vertebral arteries. Skull: No acute fracture or focal lesion. Sinuses/Orbits: Paranasal sinuses and mastoid air cells are clear. Bilateral lens replacement. Otherwise the orbits are unremarkable. Other: None. CT CERVICAL SPINE FINDINGS Alignment: Normal. Skull base and vertebrae: Mild multilevel degenerative changes of the spine. No acute fracture. No aggressive appearing focal osseous lesion or focal pathologic process. Soft tissues and spinal canal: No prevertebral fluid or swelling. No visible canal hematoma. Upper chest: Unremarkable. Other: Atherosclerotic plaque of the carotid arteries within the neck. IMPRESSION: 1. No acute intracranial abnormality. 2. No acute displaced fracture or traumatic listhesis of the cervical spine. Electronically Signed   By: Iven Finn M.D.   On: 10/05/2022 01:44   CT ABDOMEN PELVIS WO CONTRAST  Result Date: 10/05/2022 CLINICAL DATA:  Abdominal trauma, blunt on warfarin, fall several days ago, new anemia, concern for retroperitoneal hematoma EXAM: CT ABDOMEN AND PELVIS WITHOUT CONTRAST TECHNIQUE: Multidetector CT imaging of the abdomen and pelvis was performed following the standard protocol without IV contrast. RADIATION DOSE REDUCTION: This exam was performed according to the departmental dose-optimization program which includes automated exposure control, adjustment of the mA and/or kV according to patient size and/or use of iterative reconstruction technique. COMPARISON:  02/12/2007 FINDINGS: Lower chest: Densely calcified coronary arteries and aorta. No acute abnormality. Hepatobiliary: Multiple large gallstones layering within the gallbladder. No biliary ductal dilatation or focal hepatic abnormality. No evidence of a hepatic injury. Pancreas: No focal abnormality or ductal dilatation. Spleen: Normal size.  No focal abnormality or  evidence of injury. Adrenals/Urinary Tract: Few small nonobstructing left renal stones. No ureteral stones or hydronephrosis. Adrenal glands and urinary bladder unremarkable. Stomach/Bowel: Stomach, large and small bowel grossly unremarkable. Vascular/Lymphatic: Heavily calcified aorta and iliac vessels. No evidence of aneurysm or adenopathy. Reproductive: Uterus and adnexa unremarkable.  No mass. Other: No free fluid or free air. Musculoskeletal: No acute bony abnormality. No evidence of retroperitoneal hematoma. IMPRESSION: No acute findings in the abdomen or pelvis. No evidence of retroperitoneal hematoma. Cholelithiasis. Left nephrolithiasis. Aortoiliac atherosclerosis. Electronically Signed   By: Rolm Baptise M.D.   On: 10/05/2022 01:39   DG Chest 2 View  Result Date: 10/04/2022 CLINICAL DATA:  SOB EXAM: CHEST - 2 VIEW COMPARISON:  Chest x-ray 11/03/2019 FINDINGS: Poorly defined left heart border. Otherwise the heart and mediastinal contours are unchanged. Vascular clips overlie the mediastinum. Atherosclerotic plaque. Coronary artery stents. Query developing lingular airspace opacity. Chronic coarsened markings with no overt pulmonary edema. No pleural effusion. No pneumothorax. No acute osseous abnormality.  Intact sternotomy wires. IMPRESSION: 1. Query developing lingular airspace opacity. Followup PA and lateral chest X-ray  is recommended in 3-4 weeks following therapy to ensure resolution and exclude underlying malignancy. 2.  Aortic Atherosclerosis (ICD10-I70.0). Electronically Signed   By: Iven Finn M.D.   On: 10/04/2022 20:00        Scheduled Meds:  sodium chloride   Intravenous Once   amiodarone  200 mg Oral Daily   vitamin C  500 mg Oral Daily   atorvastatin  80 mg Oral Daily   docusate sodium  100 mg Oral BID   ezetimibe  10 mg Oral Daily   Fe Fum-Vit C-Vit B12-FA  1 capsule Oral BID   folic acid  1 mg Oral QPM   insulin aspart  0-5 Units Subcutaneous QHS   insulin aspart   0-9 Units Subcutaneous TID WC   Ipratropium-Albuterol  1 puff Inhalation Q6H   isosorbide mononitrate  60 mg Oral Daily   losartan  50 mg Oral Daily   multivitamin  1 tablet Oral Daily   multivitamin with minerals  1 tablet Oral Daily   pantoprazole  40 mg Oral BID AC   Warfarin - Pharmacist Dosing Inpatient   Does not apply q1600   zinc sulfate  220 mg Oral Daily   Continuous Infusions:  remdesivir 100 mg in sodium chloride 0.9 % 100 mL IVPB     vancomycin       LOS: 1 day   Time spent= 35 mins    Rahel Carlton Arsenio Loader, MD [facility]  If 7PM-7AM, please contact night-coverage  10/06/2022, 11:30 AM

## 2022-10-07 ENCOUNTER — Inpatient Hospital Stay (HOSPITAL_COMMUNITY): Payer: Medicare Other

## 2022-10-07 DIAGNOSIS — L03115 Cellulitis of right lower limb: Secondary | ICD-10-CM | POA: Diagnosis not present

## 2022-10-07 DIAGNOSIS — R079 Chest pain, unspecified: Secondary | ICD-10-CM

## 2022-10-07 LAB — BASIC METABOLIC PANEL
Anion gap: 8 (ref 5–15)
BUN: 30 mg/dL — ABNORMAL HIGH (ref 8–23)
CO2: 23 mmol/L (ref 22–32)
Calcium: 8.1 mg/dL — ABNORMAL LOW (ref 8.9–10.3)
Chloride: 102 mmol/L (ref 98–111)
Creatinine, Ser: 1.48 mg/dL — ABNORMAL HIGH (ref 0.44–1.00)
GFR, Estimated: 34 mL/min — ABNORMAL LOW (ref >60)
Glucose, Bld: 98 mg/dL (ref 70–99)
Potassium: 3.4 mmol/L — ABNORMAL LOW (ref 3.5–5.1)
Sodium: 133 mmol/L — ABNORMAL LOW (ref 135–145)

## 2022-10-07 LAB — PREPARE RBC (CROSSMATCH)

## 2022-10-07 LAB — CBC
HCT: 22.8 % — ABNORMAL LOW (ref 36.0–46.0)
Hemoglobin: 7.3 g/dL — ABNORMAL LOW (ref 12.0–15.0)
MCH: 32.3 pg (ref 26.0–34.0)
MCHC: 32 g/dL (ref 30.0–36.0)
MCV: 100.9 fL — ABNORMAL HIGH (ref 80.0–100.0)
Platelets: 204 10*3/uL (ref 150–400)
RBC: 2.26 MIL/uL — ABNORMAL LOW (ref 3.87–5.11)
RDW: 16.8 % — ABNORMAL HIGH (ref 11.5–15.5)
WBC: 11.6 10*3/uL — ABNORMAL HIGH (ref 4.0–10.5)
nRBC: 0 % (ref 0.0–0.2)

## 2022-10-07 LAB — MAGNESIUM: Magnesium: 2.3 mg/dL (ref 1.7–2.4)

## 2022-10-07 LAB — PROTIME-INR
INR: 2.3 — ABNORMAL HIGH (ref 0.8–1.2)
Prothrombin Time: 25.4 seconds — ABNORMAL HIGH (ref 11.4–15.2)

## 2022-10-07 LAB — GLUCOSE, CAPILLARY
Glucose-Capillary: 108 mg/dL — ABNORMAL HIGH (ref 70–99)
Glucose-Capillary: 134 mg/dL — ABNORMAL HIGH (ref 70–99)
Glucose-Capillary: 114 mg/dL — ABNORMAL HIGH (ref 70–99)
Glucose-Capillary: 178 mg/dL — ABNORMAL HIGH (ref 70–99)

## 2022-10-07 MED ORDER — SODIUM CHLORIDE 0.9% IV SOLUTION: Freq: Once | INTRAVENOUS | Status: DC

## 2022-10-07 MED ORDER — CEFAZOLIN SODIUM-DEXTROSE 2-4 GM/100ML-% IV SOLN
2.0000 g | Freq: Two times a day (BID) | INTRAVENOUS | Status: DC
  Administered 2022-10-07 – 2022-10-08 (×3): 2 g via INTRAVENOUS
  Filled 2022-10-07 (×3): qty 100

## 2022-10-07 MED ORDER — WARFARIN SODIUM 2.5 MG PO TABS
2.5000 mg | ORAL_TABLET | Freq: Once | ORAL | Status: AC
  Administered 2022-10-07: 2.5 mg via ORAL
  Filled 2022-10-07: qty 1

## 2022-10-07 MED ORDER — HYDROXYZINE HCL 10 MG PO TABS
10.0000 mg | ORAL_TABLET | Freq: Once | ORAL | Status: AC | PRN
  Administered 2022-10-07: 10 mg via ORAL
  Filled 2022-10-07: qty 1

## 2022-10-07 MED ORDER — POTASSIUM CHLORIDE CRYS ER 20 MEQ PO TBCR
40.0000 meq | EXTENDED_RELEASE_TABLET | Freq: Once | ORAL | Status: AC
  Administered 2022-10-07: 40 meq via ORAL
  Filled 2022-10-07: qty 2

## 2022-10-07 NOTE — Progress Notes (Signed)
Patient was given a dose of Atarax for anxiety. She continues to feel anxious and now also reports SOB and chest pressure. Plan to check CXR, EKG, and troponins.

## 2022-10-07 NOTE — Progress Notes (Signed)
PROGRESS NOTE    [name]  VEL:[id number] DOB: 1933/09/16 DOA: 10/04/2022 PCP: Gaynelle Arabian, MD   Brief Narrative:  89 year old with history of CAD status post CABG, aortic valve disease status post mechanical AVR, paroxysmal A-fib on Coumadin, HTN, HLD, DM2, GI bleed, CKD stage IIIb, diastolic CHF comes to the ED with complaints of right lower extremity pain, swelling and erythema.  There was concerns of volume overload cellulitis therefore admitted the patient.  Screening for COVID-19 was also positive.  Due to signs of anemia, patient was given 1 unit of PRBC.  CT scan did not show any deep infection suggestive of necrotizing fasciitis   Assessment & Plan:  Principal Problem:   Cellulitis     Right lower extremity cellulitis, POA X-ray showing diffuse edema.  CT does not show any evidence of deep infection, follow culture data.  Pain control, bowel regimen.  Will transition IV vancomycin to Ancef   COVID-19 viral infection Supportive care.  Bronchodilators.  Discontinue remdesivir.  Blood loss anemia, iron deficiency History of gastric ulcers No obvious signs of bleeding.  Baseline hemoglobin 11, admission hemoglobin 7.3, received 1 unit of PRBC  Endoscopy given March 2021 showed nonbleeding gastric ulcer.  Patient is on Coumadin for mechanical valve.  Briefly discussed case with Dr. Benson Norway from GI who agrees to proceed with conservative management at this time and continuing Coumadin. Iron studies showed low saturation and borderline low ferritin.  Received a dose of IV iron.  Continue PPI twice daily.  Hemoglobin still drifting down very slowly, will order 1 unit of PRBC again.  CAD status post CABG in 1990s Status post PCI 2016 -Currently chest pain-free.  Continue home medications   Prolonged QTc Continue to monitor   Paroxysmal A-fib, on Coumadin Mechanical aortic valve Has mechanical valve in place, INR therapeutic.  Pharmacy to manage   Generalized  weakness PT/OT  Essential hypertension -Resume home meds.  IV as needed ordered  Diabetes mellitus type 2 -Sliding scale and Accu-Cheks   Hyperlipidemia Resume home regimen.   Chronic diastolic CHF Euvolemic on exam, BNP 236 Strict I's and O's and daily weight      DVT prophylaxis: Coumadin Code Status: Full code Family Communication: Husband present at bedside  Status is: Inpatient Maintain hospital stay for IV antibiotics and acute blood loss anemia management   Subjective: Overall right lower extremity pain has improved.  Still has erythema but continues to slowly improve.  Patient also continues to cough  Examination: Constitutional: Not in acute distress Respiratory: Bilateral rhonchi, slightly improved compared to yesterday Cardiovascular: Normal sinus rhythm, no rubs Abdomen: Nontender nondistended good bowel sounds Musculoskeletal: No edema noted Skin: Right lower extremity erythema noted, swelling appears to have improved.  Less tender today. Neurologic: CN 2-12 grossly intact.  And nonfocal Psychiatric: Normal judgment and insight. Alert and oriented x 3. Normal mood. Objective: Vitals:   10/06/22 0409 10/06/22 0946 10/06/22 2023 10/07/22 0543  BP: (!) 92/54 92/67 (!) 104/46 (!) 110/53  Pulse: 88 94 91 80  Resp: '18 18 18 18  '$ Temp: 98 F (36.7 C) 98.4 F (36.9 C) 97.9 F (36.6 C) 97.6 F (36.4 C)  TempSrc: Oral  Oral Oral  SpO2: 99% 100% 96% 99%  Weight:      Height:        Intake/Output Summary (Last 24 hours) at 10/07/2022 0817 Last data filed at 10/07/2022 0651 Gross per 24 hour  Intake --  Output 400 ml  Net -400 ml  Filed Weights   10/04/22 1850  Weight: 68.6 kg     Data Reviewed:   CBC: Recent Labs  Lab 10/04/22 1908 10/05/22 0608 10/06/22 0702  WBC 4.6 8.8 9.8  NEUTROABS 3.9  --   --   HGB 7.3* 7.6* 7.4*  HCT 25.3* 24.8* 23.2*  MCV 109.5* 104.2* 103.1*  PLT 228 202 008   Basic Metabolic Panel: Recent Labs  Lab  10/04/22 1908 10/05/22 0608 10/06/22 0702  NA 134* 133* 133*  K 4.0 3.6 3.5  CL 102 100 103  CO2 21* 20* 24  GLUCOSE 128* 135* 102*  BUN 29* 28* 29*  CREATININE 1.68* 1.68* 1.64*  CALCIUM 8.4* 8.1* 8.0*  MG  --  2.1 2.2  PHOS  --  3.7  --    GFR: Estimated Creatinine Clearance: 20.9 mL/min (A) (by C-G formula based on SCr of 1.64 mg/dL (H)). Liver Function Tests: Recent Labs  Lab 10/04/22 1908 10/05/22 0608  AST 69* 546*  ALT 51* 311*  ALKPHOS 45 43  BILITOT 0.9 1.8*  PROT 6.0* 5.4*  ALBUMIN 3.3* 2.7*   No results for input(s): "LIPASE", "AMYLASE" in the last 168 hours. No results for input(s): "AMMONIA" in the last 168 hours. Coagulation Profile: Recent Labs  Lab 10/03/22 1126 10/05/22 0315 10/06/22 1241  INR 2.9 2.4* 2.5*   Cardiac Enzymes: No results for input(s): "CKTOTAL", "CKMB", "CKMBINDEX", "TROPONINI" in the last 168 hours. BNP (last 3 results) No results for input(s): "PROBNP" in the last 8760 hours. HbA1C: No results for input(s): "HGBA1C" in the last 72 hours. CBG: Recent Labs  Lab 10/06/22 0754 10/06/22 1157 10/06/22 1707 10/06/22 2015 10/07/22 0725  GLUCAP 94 127* 103* 145* 108*   Lipid Profile: No results for input(s): "CHOL", "HDL", "LDLCALC", "TRIG", "CHOLHDL", "LDLDIRECT" in the last 72 hours. Thyroid Function Tests: No results for input(s): "TSH", "T4TOTAL", "FREET4", "T3FREE", "THYROIDAB" in the last 72 hours. Anemia Panel: Recent Labs    10/05/22 0900 10/06/22 0702  FOLATE  --  12.0  FERRITIN 96  --   TIBC 365  --   IRON 27*  --    Sepsis Labs: Recent Labs  Lab 10/04/22 1908 10/04/22 2145  LATICACIDVEN 2.0* 1.6    Recent Results (from the past 240 hour(s))  Resp panel by RT-PCR (RSV, Flu A&B, Covid) Anterior Nasal Swab     Status: Abnormal   Collection Time: 10/05/22  3:03 AM   Specimen: Anterior Nasal Swab  Result Value Ref Range Status   SARS Coronavirus 2 by RT PCR POSITIVE (A) NEGATIVE Final    Comment:  (NOTE) SARS-CoV-2 target nucleic acids are DETECTED.  The SARS-CoV-2 RNA is generally detectable in upper respiratory specimens during the acute phase of infection. Positive results are indicative of the presence of the identified virus, but do not rule out bacterial infection or co-infection with other pathogens not detected by the test. Clinical correlation with patient history and other diagnostic information is necessary to determine patient infection status. The expected result is Negative.  Fact Sheet for Patients: EntrepreneurPulse.com.au  Fact Sheet for Healthcare Providers: IncredibleEmployment.be  This test is not yet approved or cleared by the [place] FDA and  has been authorized for detection and/or diagnosis of SARS-CoV-2 by FDA under an Emergency Use Authorization (EUA).  This EUA will remain in effect (meaning this test can be used) for the duration of  the COVID-19 declaration under Section 564(b)(1) of the A ct, 21 U.S.C. section 360bbb-3(b)(1), unless the authorization is  terminated or revoked sooner.     Influenza A by PCR NEGATIVE NEGATIVE Final   Influenza B by PCR NEGATIVE NEGATIVE Final    Comment: (NOTE) The Xpert Xpress SARS-CoV-2/FLU/RSV plus assay is intended as an aid in the diagnosis of influenza from Nasopharyngeal swab specimens and should not be used as a sole basis for treatment. Nasal washings and aspirates are unacceptable for Xpert Xpress SARS-CoV-2/FLU/RSV testing.  Fact Sheet for Patients: EntrepreneurPulse.com.au  Fact Sheet for Healthcare Providers: IncredibleEmployment.be  This test is not yet approved or cleared by the [place] FDA and has been authorized for detection and/or diagnosis of SARS-CoV-2 by FDA under an Emergency Use Authorization (EUA). This EUA will remain in effect (meaning this test can be used) for the duration of the COVID-19  declaration under Section 564(b)(1) of the Act, 21 U.S.C. section 360bbb-3(b)(1), unless the authorization is terminated or revoked.     Resp Syncytial Virus by PCR NEGATIVE NEGATIVE Final    Comment: (NOTE) Fact Sheet for Patients: EntrepreneurPulse.com.au  Fact Sheet for Healthcare Providers: IncredibleEmployment.be  This test is not yet approved or cleared by the [place] FDA and has been authorized for detection and/or diagnosis of SARS-CoV-2 by FDA under an Emergency Use Authorization (EUA). This EUA will remain in effect (meaning this test can be used) for the duration of the COVID-19 declaration under Section 564(b)(1) of the Act, 21 U.S.C. section 360bbb-3(b)(1), unless the authorization is terminated or revoked.  Performed at [facility], [place] [address], [place], [place] [address]   Culture, blood (Routine X 2) w Reflex to ID Panel     Status: None (Preliminary result)   Collection Time: 10/05/22 11:05 AM   Specimen: BLOOD  Result Value Ref Range Status   Specimen Description BLOOD LEFT ANTECUBITAL  Final   Special Requests   Final    BOTTLES DRAWN AEROBIC ONLY Blood Culture results may not be optimal due to an inadequate volume of blood received in culture bottles   Culture   Final    NO GROWTH < 24 HOURS Performed at [facility], [place] [address], [place], [place] [address]    Report Status PENDING  Incomplete  Culture, blood (Routine X 2) w Reflex to ID Panel     Status: None (Preliminary result)   Collection Time: 10/05/22 11:25 AM   Specimen: BLOOD RIGHT HAND  Result Value Ref Range Status   Specimen Description BLOOD RIGHT HAND  Final   Special Requests   Final    BOTTLES DRAWN AEROBIC ONLY Blood Culture results may not be optimal due to an inadequate volume of blood received in culture bottles   Culture   Final    NO GROWTH < 24 HOURS Performed at [facility], [place] [address], [place], [place]  [id number]    Report Status PENDING  Incomplete         Radiology Studies: CT EXTREMITY LOWER RIGHT WO CONTRAST  Result Date: 10/05/2022 CLINICAL DATA:  Redness and swelling of the right lower leg EXAM: CT OF THE LOWER RIGHT EXTREMITY WITHOUT CONTRAST TECHNIQUE: Multidetector CT imaging of the right lower extremity was performed according to the standard protocol. RADIATION DOSE REDUCTION: This exam was performed according to the departmental dose-optimization program which includes automated exposure control, adjustment of the mA and/or kV according to patient size and/or use of iterative reconstruction technique. COMPARISON:  X-ray 10/05/2022 FINDINGS: Bones/Joint/Cartilage No acute fracture. No dislocation. No bone erosion periosteal elevation. Mild-to-moderate osteoarthritic  changes at the knee and mild degenerative changes within the foot. Trace knee joint effusion. No tibiotalar or subtalar joint effusion. No lytic or sclerotic bone lesion. Ligaments Suboptimally assessed by CT. Muscles and Tendons No acute musculotendinous abnormality by CT. No intramuscular fluid collection. No appreciable tenosynovial fluid collection. Soft tissues Circumferential subcutaneous edema and ill-defined fluid of the lower leg and foot. No organized or drainable fluid collection. No deep fascial fluid. No soft tissue gas. No discernible wound or ulceration. IMPRESSION: 1. Circumferential subcutaneous edema and ill-defined fluid of the lower leg and foot, nonspecific but may represent cellulitis in the appropriate clinical setting. No organized or drainable fluid collection. No deep fascial fluid. No soft tissue gas. 2. No acute osseous abnormality of the right lower extremity. 3. Mild-to-moderate osteoarthritic changes at the knee and mild degenerative changes within the foot. Trace knee joint effusion, likely degenerative. Electronically Signed   By: Davina Poke D.O.   On: 10/05/2022 09:08        Scheduled  Meds:  sodium chloride   Intravenous Once   amiodarone  200 mg Oral Daily   vitamin C  500 mg Oral Daily   atorvastatin  80 mg Oral Daily   docusate sodium  100 mg Oral BID   ezetimibe  10 mg Oral Daily   Fe Fum-Vit C-Vit B12-FA  1 capsule Oral BID   folic acid  1 mg Oral QPM   insulin aspart  0-5 Units Subcutaneous QHS   insulin aspart  0-9 Units Subcutaneous TID WC   isosorbide mononitrate  60 mg Oral Daily   losartan  50 mg Oral Daily   multivitamin  1 tablet Oral Daily   multivitamin with minerals  1 tablet Oral Daily   pantoprazole  40 mg Oral BID AC   Warfarin - Pharmacist Dosing Inpatient   Does not apply q1600   zinc sulfate  220 mg Oral Daily   Continuous Infusions:  vancomycin 1,250 mg (10/06/22 2335)     LOS: 2 days   Time spent= 35 mins    Paulanthony Gleaves Arsenio Loader, MD [facility]  If 7PM-7AM, please contact night-coverage  10/07/2022, 8:17 AM

## 2022-10-07 NOTE — Progress Notes (Signed)
Physical Therapy Treatment Patient Details Name: [name] MRN: [id number] DOB: 1933-01-31 Today's Date: 10/07/2022   History of Present Illness [name] is a 89 y.o. female who presented to [facility] ED due to right leg pain, swelling, redness and warmth.  Endorses that she fell few days ago.  She is unsure if she hit her right leg.  She noticed the redness and the pain yesterday around 6 PM and it rapidly progressed; covid + as well; with medical history significant for coronary artery disease status post CABG, aortic valve disease status post mechanical AVR, paroxysmal A-fib, on Coumadin, hypertension, hyperlipidemia, type 2 diabetes, history of GI bleed, CKD 3B, chronic diastolic CHF,    PT Comments    Continuing work on functional mobility and activity tolerance;  session focused on gently increasing activity tolerance, and beginning to encourage gait; noting slow, but noteworthy improvement in standing tolerance and able to begin taking more steps; Pt's husband reports they have family nearby and neighbors who can help with stairs  Recommendations for follow up therapy are one component of a multi-disciplinary discharge planning process, led by the attending physician.  Recommendations may be updated based on patient status, additional functional criteria and insurance authorization.  Follow Up Recommendations  Home health PT (SNF for post-acute rehab is indicated, however pt and husband choosing home over SNF)     Assistance Recommended at Discharge Frequent or constant Supervision/Assistance  Patient can return home with the following A lot of help with walking and/or transfers;A lot of help with bathing/dressing/bathroom;Assistance with cooking/housework;Assist for transportation;Help with stairs or ramp for entrance   Equipment Recommendations  Other (comment) (recommend getting info re: getting a ramp for pt and husband)    Recommendations for Other Services        Precautions / Restrictions Precautions Precautions: Fall Precaution Comments: covid     Mobility  Bed Mobility                    Transfers Overall transfer level: Needs assistance Equipment used: Rolling walker (2 wheels) Transfers: Sit to/from Stand Sit to Stand: Mod assist, Min assist           General transfer comment: initially needing mod assist but with more reps noting better anterior wieght shift and needin gmin assist    Ambulation/Gait Ambulation/Gait assistance: Min assist Gait Distance (Feet): 18 Feet (repested steps forward and backward) Assistive device: Rolling walker (2 wheels) Gait Pattern/deviations: Decreased step length - right, Decreased step length - left       General Gait Details: Cues to self-monitor for activity tolerance; short steps, fatigued quickly   Stairs             Wheelchair Mobility    Modified Rankin (Stroke Patients Only)       Balance             Standing balance-Leahy Scale: Poor                              Cognition Arousal/Alertness: Awake/alert Behavior During Therapy: WFL for tasks assessed/performed Overall Cognitive Status: History of cognitive impairments - at baseline                                 General Comments: ST memory deficits noted        Exercises  General Comments General comments (skin integrity, edema, etc.): on room air, O2 sats 98-100%      Pertinent Vitals/Pain Pain Assessment Pain Assessment: Faces Faces Pain Scale: Hurts a little bit Pain Location: R lower leg Pain Descriptors / Indicators: Tender Pain Intervention(s): Monitored during session    Home Living                          Prior Function            PT Goals (current goals can now be found in the care plan section) Acute Rehab PT Goals Patient Stated Goal: leg to feel better PT Goal Formulation: With patient/family Time For Goal Achievement:  10/19/22 Potential to Achieve Goals: Good Progress towards PT goals: Progressing toward goals    Frequency    Min 3X/week      PT Plan Current plan remains appropriate (slow progress, worth considering SNF, but pt and husband choosing home)    Co-evaluation              AM-PAC PT "6 Clicks" Mobility   Outcome Measure  Help needed turning from your back to your side while in a flat bed without using bedrails?: None Help needed moving from lying on your back to sitting on the side of a flat bed without using bedrails?: A Little Help needed moving to and from a bed to a chair (including a wheelchair)?: A Lot Help needed standing up from a chair using your arms (e.g., wheelchair or bedside chair)?: A Lot Help needed to walk in hospital room?: A Lot Help needed climbing 3-5 steps with a railing? : Total 6 Click Score: 14    End of Session Equipment Utilized During Treatment: Gait belt Activity Tolerance: Patient tolerated treatment well Patient left: in chair;with call bell/phone within reach;with chair alarm set Nurse Communication: Mobility status PT Visit Diagnosis: Unsteadiness on feet (R26.81);Other abnormalities of gait and mobility (R26.89);Pain Pain - Right/Left: Right Pain - part of body: Leg     Time: 6073-7106 PT Time Calculation (min) (ACUTE ONLY): 22 min  Charges:  $Therapeutic Activity: 8-22 mins                     Roney Marion, PT  Acute Rehabilitation Services Office [place] 10/07/2022, 5:56 PM

## 2022-10-07 NOTE — Progress Notes (Signed)
Pharmacy Antibiotic Note  [name] is a 89 y.o. female admitted on 10/04/2022 with cellulitis.  Pharmacy has been consulted for Cefazolin dosing.  Plan: Stop vancomycin Start cefazolin 2g IV Q12h   Trend WBC, fever, renal function F/u cultures, clinical progress De-escalate when able  Height: 5' (152.4 cm) Weight: 68.6 kg (151 lb 3.8 oz) IBW/kg (Calculated) : 45.5  Temp (24hrs), Avg:98 F (36.7 C), Min:97.6 F (36.4 C), Max:98.4 F (36.9 C)  Recent Labs  Lab 10/04/22 1908 10/04/22 2145 10/05/22 0608 10/06/22 0702  WBC 4.6  --  8.8 9.8  CREATININE 1.68*  --  1.68* 1.64*  LATICACIDVEN 2.0* 1.6  --   --     Estimated Creatinine Clearance: 20.9 mL/min (A) (by C-G formula based on SCr of 1.64 mg/dL (H)).    Allergies  Allergen Reactions   Darvon [Propoxyphene] Nausea And Vomiting   Lisinopril Cough   Septra [Sulfamethoxazole-Trimethoprim] Other (See Comments)    Increased INR   Warfarin And Related Other (See Comments)    ONLY TOLERATES BRAND   Trazodone And Nefazodone     Severe sweating   Zolpidem Tartrate     Hallucinations   Penicillins Rash    Did it involve swelling of the face/tongue/throat, SOB, or low BP? No Did it involve sudden or severe rash/hives, skin peeling, or any reaction on the inside of your mouth or nose? No Did you need to seek medical attention at a hospital or doctor's office? No When did it last happen?  15 years     If all above answers are "NO", may proceed with cephalosporin use.   Antimicrobials this admission: Vancomycin 1/14 >> 1/15   Thank you for allowing pharmacy to be a part of this patient's care.  Ardyth Harps, PharmD Clinical Pharmacist

## 2022-10-07 NOTE — Progress Notes (Signed)
ANTICOAGULATION CONSULT NOTE  Pharmacy Consult for Warfarin Indication:  mechanical valve  Allergies  Allergen Reactions   Darvon [Propoxyphene] Nausea And Vomiting   Lisinopril Cough   Septra [Sulfamethoxazole-Trimethoprim] Other (See Comments)    Increased INR   Warfarin And Related Other (See Comments)    ONLY TOLERATES BRAND   Trazodone And Nefazodone     Severe sweating   Zolpidem Tartrate     Hallucinations   Penicillins Rash    Did it involve swelling of the face/tongue/throat, SOB, or low BP? No Did it involve sudden or severe rash/hives, skin peeling, or any reaction on the inside of your mouth or nose? No Did you need to seek medical attention at a hospital or doctor's office? No When did it last happen?  15 years     If all above answers are "NO", may proceed with cephalosporin use.    Patient Measurements: Height: 5' (152.4 cm) Weight: 68.6 kg (151 lb 3.8 oz) IBW/kg (Calculated) : 45.5  Vital Signs: Temp: 98.4 F (36.9 C) (01/16 0908) Temp Source: Oral (01/16 0908) BP: 112/60 (01/16 0908) Pulse Rate: 83 (01/16 0908)  Labs: Recent Labs    10/04/22 1908 10/04/22 2145 10/05/22 0315 10/05/22 0608 10/06/22 0702 10/06/22 1241 10/07/22 0732  HGB 7.3*  --   --  7.6* 7.4*  --  7.3*  HCT 25.3*  --   --  24.8* 23.2*  --  22.8*  PLT 228  --   --  202 193  --  204  LABPROT  --   --  25.9*  --   --  26.6* 25.4*  INR  --   --  2.4*  --   --  2.5* 2.3*  CREATININE 1.68*  --   --  1.68* 1.64*  --  1.48*  TROPONINIHS 13 13  --   --   --   --   --     Estimated Creatinine Clearance: 23.1 mL/min (A) (by C-G formula based on SCr of 1.48 mg/dL (H)).   Assessment: 89 YO female with history of mechanical AVR and Afib on warfarin PTA who is admitted for right leg pain with concerns for cellulitis. Also has remote history of GIB. Due to this, goal INR lower than normal. INR 2.3. Hgb low stable 7.3, platelets 204. No issues with bleeding reported.  PTA regimen: 1.25 mg  daily except 2.5 mg Tue and Thu    Goal of Therapy:  INR 2.0-2.5 (history of GIB 01/2013) Monitor platelets by anticoagulation protocol: Yes   Plan:  Give warfarin 2.5 mg PO x1 dose Check INR daily while on warfarin Continue to monitor H&H and platelets  Thank you for allowing pharmacy to be a part of this patient's care.  Ardyth Harps, PharmD Clinical Pharmacist

## 2022-10-07 NOTE — Progress Notes (Signed)
Mobility Specialist Progress Note   10/07/22 1230  Mobility  Activity Stood at bedside;Repositioned in chair (Chair level LE exercise)  Level of Assistance Moderate assist, patient does 50-74% (sit>stand)  Assistive Device Front wheel walker  Range of Motion/Exercises Active;All extremities  Activity Response Tolerated well   Patient received in recliner chair finishing up lunch and agreeable to participate. Completed STS x2 requiring mod A + cues for hand placement. Patient deferred taking steps away from recliner but agreed to LE exercises. Tolerated without complaint or incident. Was left in recliner with all needs met, call bell in reach.   [place] Darek Eifler, BS EXP Mobility Specialist Please contact via SecureChat or Rehab office at [phone]

## 2022-10-08 DIAGNOSIS — L03115 Cellulitis of right lower limb: Secondary | ICD-10-CM | POA: Diagnosis not present

## 2022-10-08 LAB — BPAM RBC
Blood Product Expiration Date: 202401192359
Blood Product Expiration Date: 202401242359
ISSUE DATE / TIME: 202401140436
ISSUE DATE / TIME: 202401161706
Unit Type and Rh: 9500
Unit Type and Rh: 9500

## 2022-10-08 LAB — CBC
HCT: 28.5 % — ABNORMAL LOW (ref 36.0–46.0)
Hemoglobin: 9.2 g/dL — ABNORMAL LOW (ref 12.0–15.0)
MCH: 31.2 pg (ref 26.0–34.0)
MCHC: 32.3 g/dL (ref 30.0–36.0)
MCV: 96.6 fL (ref 80.0–100.0)
Platelets: 194 10*3/uL (ref 150–400)
RBC: 2.95 MIL/uL — ABNORMAL LOW (ref 3.87–5.11)
RDW: 19.3 % — ABNORMAL HIGH (ref 11.5–15.5)
WBC: 7.3 10*3/uL (ref 4.0–10.5)
nRBC: 0 % (ref 0.0–0.2)

## 2022-10-08 LAB — TYPE AND SCREEN
ABO/RH(D): O NEG
Antibody Screen: NEGATIVE
Unit division: 0

## 2022-10-08 LAB — TROPONIN I (HIGH SENSITIVITY)
Troponin I (High Sensitivity): 20 ng/L — ABNORMAL HIGH (ref <18)
Troponin I (High Sensitivity): 15 ng/L (ref <18)

## 2022-10-08 LAB — BASIC METABOLIC PANEL
Anion gap: 7 (ref 5–15)
BUN: 32 mg/dL — ABNORMAL HIGH (ref 8–23)
CO2: 24 mmol/L (ref 22–32)
Calcium: 8.1 mg/dL — ABNORMAL LOW (ref 8.9–10.3)
Chloride: 102 mmol/L (ref 98–111)
Creatinine, Ser: 1.5 mg/dL — ABNORMAL HIGH (ref 0.44–1.00)
GFR, Estimated: 34 mL/min — ABNORMAL LOW (ref >60)
Glucose, Bld: 74 mg/dL (ref 70–99)
Potassium: 4.6 mmol/L (ref 3.5–5.1)
Sodium: 133 mmol/L — ABNORMAL LOW (ref 135–145)

## 2022-10-08 LAB — GLUCOSE, CAPILLARY
Glucose-Capillary: 86 mg/dL (ref 70–99)
Glucose-Capillary: 133 mg/dL — ABNORMAL HIGH (ref 70–99)
Glucose-Capillary: 183 mg/dL — ABNORMAL HIGH (ref 70–99)
Glucose-Capillary: 152 mg/dL — ABNORMAL HIGH (ref 70–99)
Glucose-Capillary: 132 mg/dL — ABNORMAL HIGH (ref 70–99)

## 2022-10-08 LAB — PROTIME-INR
INR: 2.3 — ABNORMAL HIGH (ref 0.8–1.2)
Prothrombin Time: 25.3 seconds — ABNORMAL HIGH (ref 11.4–15.2)

## 2022-10-08 LAB — MAGNESIUM: Magnesium: 2.4 mg/dL (ref 1.7–2.4)

## 2022-10-08 MED ORDER — FUROSEMIDE 40 MG PO TABS
40.0000 mg | ORAL_TABLET | Freq: Every day | ORAL | Status: DC
  Administered 2022-10-08 – 2022-10-09 (×2): 40 mg via ORAL
  Filled 2022-10-08 (×2): qty 1

## 2022-10-08 MED ORDER — LORAZEPAM 0.5 MG PO TABS
0.5000 mg | ORAL_TABLET | Freq: Three times a day (TID) | ORAL | Status: DC | PRN
  Administered 2022-10-08: 0.5 mg via ORAL
  Filled 2022-10-08: qty 1

## 2022-10-08 MED ORDER — CEPHALEXIN 500 MG PO CAPS
500.0000 mg | ORAL_CAPSULE | Freq: Three times a day (TID) | ORAL | Status: DC
  Administered 2022-10-08 – 2022-10-09 (×2): 500 mg via ORAL
  Filled 2022-10-08 (×2): qty 1

## 2022-10-08 MED ORDER — WARFARIN 1.25 MG HALF TABLET
1.2500 mg | ORAL_TABLET | Freq: Once | ORAL | Status: AC
  Administered 2022-10-08: 1.25 mg via ORAL
  Filled 2022-10-08: qty 1

## 2022-10-08 NOTE — Progress Notes (Signed)
Pt. not feeling much better. Pt. Still c/o anxiety and now also band-like pressure under her breasts and dyspnea. Dr. Myna Hidalgo made aware.

## 2022-10-08 NOTE — Progress Notes (Addendum)
PROGRESS NOTE    [name]  YKD:[id number] DOB: 26-Sep-1932 DOA: 10/04/2022 PCP: Gaynelle Arabian, MD    Brief Narrative:   [name] is a 89 y.o. female with past medical history significant for CAD s/p CABG, aortic valve disease s/p mechanical AVR, paroxysmal atrial fibrillation on Coumadin, HTN, HLD, DM2, history of GI bleed, CKD stage IIIb, chronic diastolic CHF who presented to [facility] ED on 1/13 with complaints of right lower extremity pain, swelling and erythema.  Reports a fall few days prior to admission.  In the ED, temperature 98.6 F, HR 92, RR 22, BP 128/55, SpO2 99% on room air.  Sodium 134, potassium 4.0, chloride 102, CO2 21, glucose 128, BUN 29, creatinine 1.68.  AST 69, ALT 51, total bilirubin 0.9.  BNP 236.3.  High sensitive troponin 13 followed by 13.  Lactic acid 2.0.  WBC 4.6, hemoglobin 7.3, platelets 228.  COVID-19 PCR positive.  Influenza A/B/RSV PCR negative.  CT head/C-spine without contrast with no acute intracranial abnormality, no acute displaced fracture or traumatic listhesis of the C-spine.  CT Abdo/pelvis with no acute findings in the abdomen/pelvis, no evidence of retroperitoneal hematoma.  Chest x-ray with questionable lingular airspace opacity.  Right tib/fib x-ray with diffuse edema without evidence of soft tissue gas or new foreign body.  CT right lower extremity with circumferential subcutaneous edema and ill-defined fluid of the lower leg and foot nonspecific but consistent with cellulitis, no organized or drainable fluid collection, no deep fascial fluid, no soft tissue gas, no acute osseous abnormality.  Patient was started on empiric IV antibiotics for concern for cellulitis.  Peripheral blood cultures x 2 ordered.  TRH consulted for further evaluation management of right lower extremity cellulitis.  Assessment & Plan:   Right lower extremity cellulitis, POA Patient presenting with progressive swelling, redness, pain to right lower extremity. Right  tib/fib x-ray with diffuse edema without evidence of soft tissue gas or new foreign body.  CT right lower extremity with circumferential subcutaneous edema and ill-defined fluid of the lower leg and foot nonspecific but consistent with cellulitis, no organized or drainable fluid collection, no deep fascial fluid, no soft tissue gas, no acute osseous abnormality.  D-dimer less than 0.27. -- Transition cefazolin to Keflex '500mg'$  PO q8h; plan total course of biotics x 10 days -- Right lower extremity elevation  COVID-19 viral infection Received 2 doses of remdesivir, now discontinued. --Nebs as needed for shortness of breath/wheezing -- Supportive care -- Continue airborne/contact isolation  Blood loss anemia Hx iron deficiency anemia Hx gastric ulcers Baseline hemoglobin 11, hemoglobin admission 7.3.  Patient with history of nonbleeding gastric ulcers noted on endoscopy March 2021.  Patient currently on Coumadin for mechanical aortic valve and paroxysmal atrial fibrillation.  Case was discussed with GI, Dr. Benson Norway with recommendation of conservative managements, blood transfusions and continue take Coumadin.  Iron studies with low iron saturation, borderline low ferritin.  Received 1 dose of IV iron.  Transfuse 1 unit PRBC on 1/14 and 1/16. --Hgb 7.3>7.6>7.4>7.3>9.2 --Continue home iron supplementation twice daily  CAD CABG/PCI -- Atorvastatin 80 mg p.o. daily -- Ezetimibe 10 mg p.o. daily -- Imdur 60 mg p.o. daily  Paroxysmal atrial fibrillation on Coumadin Hx aortic valve disease s/p mechanical AVR -- Amiodarone 200 mg p.o. daily -- Continue Coumadin, pharmacy consulted for dosing/monitoring (goal 2.0-2.5 given Hx GIB) -- INR daily  Essential Hypertension -- Losartan 50 mg p.o. daily -- Imdur 60 mg p.o. daily  Type 2 diabetes mellitus Hemoglobin A1c  6.1 on 05/30/2022, well-controlled.  Diet controlled at baseline. --SSI for coverage --CBGs qAC/HS  Hyperlipidemia -- Atorvastatin 80 mg  p.o. daily -- Ezetimibe 10 mg p.o. daily  Chronic diastolic congestive heart failure, compensated Most recent TTE 11/30/2018 with LVEF 60 to 65% -- Continue losartan 50 mg p.o. daily -- Furosemide 40 mg p.o. daily -- No's and daily weights   DVT prophylaxis:  warfarin (COUMADIN) tablet 1.25 mg    Code Status: Full Code Family Communication:   Disposition Plan:  Level of care: Med-Surg Status is: Inpatient Remains inpatient appropriate because: Transitioning IV antibiotics to Keflex today, anticipate discharge home with home health    Consultants:  None, previous hospitalist discussed with GI, Dr. Benson Norway who recommended conservative measures  Procedures:  None  Antimicrobials:  Vancomycin 1/13 - 1/14 Ancef 1/16 - 1/17 Keflex 1/17>>   Subjective: Patient seen and examined at bedside, resting comfortably.  Lying in bed.  No specific complaints this morning.  Patient with reported bout of anxiety overnight.  Chest x-ray EKG and troponins ordered which were unrevealing.  Discussed with patient anticipate likely discharge home tomorrow.  No other questions or concerns at this time.  Denies headache, no dizziness, no chest pain, palpitations, no shortness of breath, no abdominal pain, no fever/chills/night sweats, no nausea cefonicid diarrhea, no focal weakness, no fatigue, no paresthesias.  No acute events overnight per nursing staff.  Objective: Vitals:   10/07/22 2218 10/08/22 0000 10/08/22 0542 10/08/22 0818  BP: 130/63  (!) 122/56 118/62  Pulse: 69 70 68 67  Resp: '18  18 18  '$ Temp:   97.7 F (36.5 C) 97.7 F (36.5 C)  TempSrc:   Oral Oral  SpO2: 99% 99% 100% 98%  Weight:      Height:        Intake/Output Summary (Last 24 hours) at 10/08/2022 1416 Last data filed at 10/08/2022 0943 Gross per 24 hour  Intake 535 ml  Output 0 ml  Net 535 ml   Filed Weights   10/04/22 1850  Weight: 68.6 kg    Examination:  Physical Exam: GEN: NAD, alert and oriented x 3,  elderly/chronically ill in appearance HEENT: NCAT, PERRL, EOMI, sclera clear, MMM PULM: CTAB w/o wheezes/crackles, normal respiratory effort, on 2 L nasal cannula with SpO2 100% at rest CV: RRR w/o M/G/R GI: abd soft, NTND, NABS, no R/G/M MSK: Right lower extremity with mild edema with surrounding erythema, muscle strength globally intact 5/5 bilateral upper/lower extremities NEURO: CN II-XII intact, no focal deficits, sensation to light touch intact PSYCH: normal mood/affect Integumentary: Right lower extremity with erythema to mid shin, otherwise no other concerning rash/lesions/wounds noted on exposed skin surfaces     Data Reviewed: I have personally reviewed following labs and imaging studies  CBC: Recent Labs  Lab 10/04/22 1908 10/05/22 0608 10/06/22 0702 10/07/22 0732 10/08/22 0859  WBC 4.6 8.8 9.8 11.6* 7.3  NEUTROABS 3.9  --   --   --   --   HGB 7.3* 7.6* 7.4* 7.3* 9.2*  HCT 25.3* 24.8* 23.2* 22.8* 28.5*  MCV 109.5* 104.2* 103.1* 100.9* 96.6  PLT 228 202 193 204 923   Basic Metabolic Panel: Recent Labs  Lab 10/04/22 1908 10/05/22 0608 10/06/22 0702 10/07/22 0732 10/08/22 0859  NA 134* 133* 133* 133* 133*  K 4.0 3.6 3.5 3.4* 4.6  CL 102 100 103 102 102  CO2 21* 20* '24 23 24  '$ GLUCOSE 128* 135* 102* 98 74  BUN 29* 28* 29* 30* 32*  CREATININE 1.68* 1.68* 1.64* 1.48* 1.50*  CALCIUM 8.4* 8.1* 8.0* 8.1* 8.1*  MG  --  2.1 2.2 2.3 2.4  PHOS  --  3.7  --   --   --    GFR: Estimated Creatinine Clearance: 22.8 mL/min (A) (by C-G formula based on SCr of 1.5 mg/dL (H)). Liver Function Tests: Recent Labs  Lab 10/04/22 1908 10/05/22 0608  AST 69* 546*  ALT 51* 311*  ALKPHOS 45 43  BILITOT 0.9 1.8*  PROT 6.0* 5.4*  ALBUMIN 3.3* 2.7*   No results for input(s): "LIPASE", "AMYLASE" in the last 168 hours. No results for input(s): "AMMONIA" in the last 168 hours. Coagulation Profile: Recent Labs  Lab 10/03/22 1126 10/05/22 0315 10/06/22 1241 10/07/22 0732  10/08/22 0859  INR 2.9 2.4* 2.5* 2.3* 2.3*   Cardiac Enzymes: No results for input(s): "CKTOTAL", "CKMB", "CKMBINDEX", "TROPONINI" in the last 168 hours. BNP (last 3 results) No results for input(s): "PROBNP" in the last 8760 hours. HbA1C: No results for input(s): "HGBA1C" in the last 72 hours. CBG: Recent Labs  Lab 10/07/22 1203 10/07/22 1705 10/07/22 2042 10/08/22 0816 10/08/22 1119  GLUCAP 134* 114* 178* 86 133*   Lipid Profile: No results for input(s): "CHOL", "HDL", "LDLCALC", "TRIG", "CHOLHDL", "LDLDIRECT" in the last 72 hours. Thyroid Function Tests: No results for input(s): "TSH", "T4TOTAL", "FREET4", "T3FREE", "THYROIDAB" in the last 72 hours. Anemia Panel: Recent Labs    10/06/22 0702  FOLATE 12.0   Sepsis Labs: Recent Labs  Lab 10/04/22 1908 10/04/22 2145  LATICACIDVEN 2.0* 1.6    Recent Results (from the past 240 hour(s))  Resp panel by RT-PCR (RSV, Flu A&B, Covid) Anterior Nasal Swab     Status: Abnormal   Collection Time: 10/05/22  3:03 AM   Specimen: Anterior Nasal Swab  Result Value Ref Range Status   SARS Coronavirus 2 by RT PCR POSITIVE (A) NEGATIVE Final    Comment: (NOTE) SARS-CoV-2 target nucleic acids are DETECTED.  The SARS-CoV-2 RNA is generally detectable in upper respiratory specimens during the acute phase of infection. Positive results are indicative of the presence of the identified virus, but do not rule out bacterial infection or co-infection with other pathogens not detected by the test. Clinical correlation with patient history and other diagnostic information is necessary to determine patient infection status. The expected result is Negative.  Fact Sheet for Patients: EntrepreneurPulse.com.au  Fact Sheet for Healthcare Providers: IncredibleEmployment.be  This test is not yet approved or cleared by the [place] FDA and  has been authorized for detection and/or diagnosis of SARS-CoV-2  by FDA under an Emergency Use Authorization (EUA).  This EUA will remain in effect (meaning this test can be used) for the duration of  the COVID-19 declaration under Section 564(b)(1) of the A ct, 21 U.S.C. section 360bbb-3(b)(1), unless the authorization is terminated or revoked sooner.     Influenza A by PCR NEGATIVE NEGATIVE Final   Influenza B by PCR NEGATIVE NEGATIVE Final    Comment: (NOTE) The Xpert Xpress SARS-CoV-2/FLU/RSV plus assay is intended as an aid in the diagnosis of influenza from Nasopharyngeal swab specimens and should not be used as a sole basis for treatment. Nasal washings and aspirates are unacceptable for Xpert Xpress SARS-CoV-2/FLU/RSV testing.  Fact Sheet for Patients: EntrepreneurPulse.com.au  Fact Sheet for Healthcare Providers: IncredibleEmployment.be  This test is not yet approved or cleared by the [place] FDA and has been authorized for detection and/or diagnosis of SARS-CoV-2 by FDA under an Emergency Use  Authorization (EUA). This EUA will remain in effect (meaning this test can be used) for the duration of the COVID-19 declaration under Section 564(b)(1) of the Act, 21 U.S.C. section 360bbb-3(b)(1), unless the authorization is terminated or revoked.     Resp Syncytial Virus by PCR NEGATIVE NEGATIVE Final    Comment: (NOTE) Fact Sheet for Patients: EntrepreneurPulse.com.au  Fact Sheet for Healthcare Providers: IncredibleEmployment.be  This test is not yet approved or cleared by the [place] FDA and has been authorized for detection and/or diagnosis of SARS-CoV-2 by FDA under an Emergency Use Authorization (EUA). This EUA will remain in effect (meaning this test can be used) for the duration of the COVID-19 declaration under Section 564(b)(1) of the Act, 21 U.S.C. section 360bbb-3(b)(1), unless the authorization is terminated or revoked.  Performed at [facility], [place] [address], [place], [place] [address]   Culture, blood (Routine X 2) w Reflex to ID Panel     Status: None (Preliminary result)   Collection Time: 10/05/22 11:05 AM   Specimen: BLOOD  Result Value Ref Range Status   Specimen Description BLOOD LEFT ANTECUBITAL  Final   Special Requests   Final    BOTTLES DRAWN AEROBIC ONLY Blood Culture results may not be optimal due to an inadequate volume of blood received in culture bottles   Culture   Final    NO GROWTH 2 DAYS Performed at [facility], [place] [address], [place], [place] [address]    Report Status PENDING  Incomplete  Culture, blood (Routine X 2) w Reflex to ID Panel     Status: None (Preliminary result)   Collection Time: 10/05/22 11:25 AM   Specimen: BLOOD RIGHT HAND  Result Value Ref Range Status   Specimen Description BLOOD RIGHT HAND  Final   Special Requests   Final    BOTTLES DRAWN AEROBIC ONLY Blood Culture results may not be optimal due to an inadequate volume of blood received in culture bottles   Culture   Final    NO GROWTH 2 DAYS Performed at [facility], [place] [address] [place], [place] [address]    Report Status PENDING  Incomplete         Radiology Studies: DG CHEST PORT 1 VIEW  Result Date: 10/07/2022 CLINICAL DATA:  Chest pain and shortness of breath COVID-19 positivity EXAM: PORTABLE CHEST 1 VIEW COMPARISON:  10/04/2022 FINDINGS: Cardiac shadow is enlarged but stable. Postsurgical changes are again seen. Lungs are hyperinflated. Previously seen nodular density is not well appreciated on this exam. No bony abnormality is seen. IMPRESSION: No active disease. Electronically Signed   By: Inez Catalina M.D.   On: 10/07/2022 23:32        Scheduled Meds:  sodium chloride   Intravenous Once   sodium chloride   Intravenous Once   amiodarone  200 mg Oral Daily   vitamin C  500 mg Oral Daily   atorvastatin  80 mg Oral Daily   cephALEXin  500 mg Oral Q8H   docusate sodium  100 mg  Oral BID   ezetimibe  10 mg Oral Daily   Fe Fum-Vit C-Vit B12-FA  1 capsule Oral BID   folic acid  1 mg Oral QPM   insulin aspart  0-5 Units Subcutaneous QHS   insulin aspart  0-9 Units Subcutaneous TID WC   isosorbide mononitrate  60 mg Oral Daily   losartan  50 mg Oral Daily   multivitamin  1 tablet Oral  Daily   multivitamin with minerals  1 tablet Oral Daily   pantoprazole  40 mg Oral BID AC   warfarin  1.25 mg Oral ONCE-1600   Warfarin - Pharmacist Dosing Inpatient   Does not apply q1600   zinc sulfate  220 mg Oral Daily   Continuous Infusions:   LOS: 3 days    Time spent: 52 minutes spent on chart review, discussion with nursing staff, consultants, updating family and interview/physical exam; more than 50% of that time was spent in counseling and/or coordination of care.    Chantele Corado J [place] DO [facility] Available via Epic secure chat 7am-7pm After these hours, please refer to coverage provider listed on [email or web address] 10/08/2022, 2:16 PM

## 2022-10-08 NOTE — Progress Notes (Signed)
Pt. C/o anxiety. Dr. Myna Hidalgo made aware.

## 2022-10-08 NOTE — Progress Notes (Signed)
ANTICOAGULATION CONSULT NOTE  Pharmacy Consult for Warfarin Indication:  mechanical valve  Allergies  Allergen Reactions   Darvon [Propoxyphene] Nausea And Vomiting   Lisinopril Cough   Septra [Sulfamethoxazole-Trimethoprim] Other (See Comments)    Increased INR   Warfarin And Related Other (See Comments)    ONLY TOLERATES BRAND   Trazodone And Nefazodone     Severe sweating   Zolpidem Tartrate     Hallucinations   Penicillins Rash    Did it involve swelling of the face/tongue/throat, SOB, or low BP? No Did it involve sudden or severe rash/hives, skin peeling, or any reaction on the inside of your mouth or nose? No Did you need to seek medical attention at a hospital or doctor's office? No When did it last happen?  15 years     If all above answers are "NO", may proceed with cephalosporin use.    Patient Measurements: Height: 5' (152.4 cm) Weight: 68.6 kg (151 lb 3.8 oz) IBW/kg (Calculated) : 45.5  Vital Signs: Temp: 97.7 F (36.5 C) (01/17 0818) Temp Source: Oral (01/17 0818) BP: 118/62 (01/17 0818) Pulse Rate: 67 (01/17 0818)  Labs: Recent Labs    10/06/22 0702 10/06/22 1241 10/07/22 0732 10/07/22 2321 10/08/22 0859  HGB 7.4*  --  7.3*  --  9.2*  HCT 23.2*  --  22.8*  --  28.5*  PLT 193  --  204  --  194  LABPROT  --  26.6* 25.4*  --  25.3*  INR  --  2.5* 2.3*  --  2.3*  CREATININE 1.64*  --  1.48*  --  1.50*  TROPONINIHS  --   --   --  20* 15    Estimated Creatinine Clearance: 22.8 mL/min (A) (by C-G formula based on SCr of 1.5 mg/dL (H)).   Assessment: 89 YO female with history of mechanical AVR and Afib on warfarin PTA who is admitted for right leg pain with concerns for cellulitis. Also has remote history of GIB. Due to this, goal INR lower than normal. INR 2.3 therapeutic. No issues with bleeding reported.   PTA regimen: 1.25 mg daily except 2.5 mg Tue and Thu    Goal of Therapy:  INR 2.0-2.5 (history of GIB 01/2013) Monitor platelets by  anticoagulation protocol: Yes   Plan:  Give warfarin 1.25 mg PO x1 dose Check INR daily while on warfarin Continue to monitor H&H and platelets  Thank you for allowing pharmacy to be a part of this patient's care.  Ardyth Harps, PharmD Clinical Pharmacist

## 2022-10-09 DIAGNOSIS — L03115 Cellulitis of right lower limb: Secondary | ICD-10-CM | POA: Diagnosis not present

## 2022-10-09 LAB — PROTIME-INR
INR: 2.5 — ABNORMAL HIGH (ref 0.8–1.2)
Prothrombin Time: 26.4 seconds — ABNORMAL HIGH (ref 11.4–15.2)

## 2022-10-09 LAB — GLUCOSE, CAPILLARY
Glucose-Capillary: 114 mg/dL — ABNORMAL HIGH (ref 70–99)
Glucose-Capillary: 214 mg/dL — ABNORMAL HIGH (ref 70–99)

## 2022-10-09 MED ORDER — LORAZEPAM 0.5 MG PO TABS
0.5000 mg | ORAL_TABLET | Freq: Once | ORAL | Status: AC
  Administered 2022-10-09: 0.5 mg via ORAL
  Filled 2022-10-09: qty 1

## 2022-10-09 MED ORDER — CEPHALEXIN 500 MG PO CAPS: 500.0000 mg | ORAL_CAPSULE | Freq: Three times a day (TID) | ORAL | 0 refills | Status: AC

## 2022-10-09 NOTE — Progress Notes (Cosign Needed)
Durable Medical Equipment  (From admission, onward)           Start     Ordered   10/09/22 1222  For home use only DME Bedside commode  Once       Comments: Patient is confined to one level of the home environment and there is no toilet on that level  Question:  Patient needs a bedside commode to treat with the following condition  Answer:  Weakness   10/09/22 1221

## 2022-10-09 NOTE — TOC Transition Note (Addendum)
Transition of Care [facility]) - CM/SW Discharge Note   Patient Details  Name: [name] MRN: [id number] Date of Birth: Sep 29, 1932  [facility] CM/SW Contact:  Tom-Johnson, Renea Ee, RN Phone Number: 10/09/2022, 12:30 PM   Clinical Narrative:     Patient is scheduled for discharge today. Home health referral called in to [facility] per patient's husband's request from [email or web address]. Cory voiced acceptance, info on AVS. BSC recommended, patient's husband declined stating they one at home.  Husband at bedside and will transport at discharge. No further TOC needs noted.      Final next level of care: Home w Home Health Services Barriers to Discharge: Barriers Resolved   Patient Goals and CMS Choice CMS Medicare.gov Compare Post Acute Care list provided to:: Patient Choice offered to / list presented to : Patient, Spouse  Discharge Placement                  Patient to be transferred to facility by: Spouse      Discharge Plan and Services Additional resources added to the After Visit Summary for                  DME Arranged: Bedside commode DME Agency: [employer] Date DME Agency Contacted: 10/09/22 Time DME Agency Contacted: 1200 Representative spoke with at DME Agency: [place]: PT, OT, RN, Nurse's Aide [place] Agency: [place] Date [place]: 10/09/22 Time [place]: 1150 Representative spoke with at [place]: [place]) Interventions [place]: No Food Insecurity (10/06/2022)  Housing: Low Risk  (10/06/2022)  Transportation Needs: No Transportation Needs (10/06/2022)  Utilities: Not At Risk (10/06/2022)  Tobacco Use: Low Risk  (10/06/2022)     Readmission Risk Interventions     No data to display

## 2022-10-09 NOTE — Progress Notes (Signed)
DISCHARGE NOTE HOME EMALI HEYWARD to be discharged Home per MD order. Discussed prescriptions and follow up appointments with the patient. Prescriptions given to patient; medication list explained in detail. Patient verbalized understanding.  Skin clean, dry and intact without evidence of skin break down, no evidence of skin tears noted. IV catheter discontinued intact. Site without signs and symptoms of complications. Dressing and pressure applied. Pt denies pain at the site currently. No complaints noted.  Patient free of lines, drains, and wounds.   An After Visit Summary (AVS) was printed and given to the patient. Patient escorted via wheelchair, and discharged home via private auto.  Hassell Halim, LPN

## 2022-10-09 NOTE — Care Management Important Message (Signed)
Important Message  Patient Details  Name: [name] MRN: [id number] Date of Birth: 1933-08-01   Medicare Important Message Given:  Yes     [name] 10/09/2022, 3:28 PM

## 2022-10-09 NOTE — Progress Notes (Signed)
Patient's RN already reviewed the AVS with patient. Husband is at the bedside to transport home. Should be leaving shortly.  SWOT RN

## 2022-10-09 NOTE — Discharge Instructions (Signed)
Please keep lower extremities elevated while sitting in a chair or lying in the bed

## 2022-10-09 NOTE — Care Management Important Message (Signed)
Important Message  Patient Details  Name: [name] MRN: [id number] Date of Birth: [birth year]   Medicare Important Message Given:  Yes     Hermon Zea [address] 10/09/2022, 12:53 PM

## 2022-10-09 NOTE — Progress Notes (Signed)
Occupational Therapy Treatment Patient Details Name: [name] MRN: [id number] DOB: 01/01/1933 Today's Date: 10/09/2022   History of present illness [name] is a 89 y.o. female who presented to [facility] ED due to right leg pain, swelling, redness and warmth.  Endorses that she fell few days ago.  She is unsure if she hit her right leg.  She noticed the redness and the pain yesterday around 6 PM and it rapidly progressed; covid + as well; with medical history significant for coronary artery disease status post CABG, aortic valve disease status post mechanical AVR, paroxysmal A-fib, on Coumadin, hypertension, hyperlipidemia, type 2 diabetes, history of GI bleed, CKD 3B, chronic diastolic CHF,   OT comments  Patient with incremental progress toward patient focused goals.  Patient needing more assist for sit to stand compared to eval date, and very slow to weight shift and attempt steps.  Patient does not have the lower body strength to stand for long periods, and is needing closer to Mod A for basic transfers.  OT continues to believe a short rehab stay to increase strength and mobility would be beneficial, but patient stating she is going home.  Unclear if intermittent HH services will be sufficient.  OT will continue efforts in the acute setting to maximize her functional status, and assist with transition to next level of care.     Recommendations for follow up therapy are one component of a multi-disciplinary discharge planning process, led by the attending physician.  Recommendations may be updated based on patient status, additional functional criteria and insurance authorization.    Follow Up Recommendations  Other (comment)     Assistance Recommended at Discharge Frequent or constant Supervision/Assistance  Patient can return home with the following  Assist for transportation;Assistance with cooking/housework;Help with stairs or ramp for entrance;A lot of help with walking and/or  transfers;A lot of help with bathing/dressing/bathroom;Direct supervision/assist for financial management;Direct supervision/assist for medications management   Equipment Recommendations  BSC/3in1    Recommendations for Other Services      Precautions / Restrictions Precautions Precautions: Fall Precaution Comments: covid Restrictions Weight Bearing Restrictions: No       Mobility Bed Mobility               General bed mobility comments: up in recliner    Transfers Overall transfer level: Needs assistance Equipment used: Rolling walker (2 wheels) Transfers: Sit to/from Stand Sit to Stand: Mod assist                 Balance Overall balance assessment: Needs assistance Sitting-balance support: Feet supported Sitting balance-Leahy Scale: Good     Standing balance support: Reliant on assistive device for balance Standing balance-Leahy Scale: Poor                             ADL either performed or assessed with clinical judgement   ADL Overall ADL's : Needs assistance/impaired Eating/Feeding: Set up;Sitting   Grooming: Wash/dry hands;Wash/dry face;Oral care;Set up;Sitting           Upper Body Dressing : Minimal assistance;Sitting   Lower Body Dressing: Moderate assistance;Sit to/from stand;Maximal assistance   Toilet Transfer: Moderate assistance;BSC/3in1;Stand-pivot Toilet Transfer Details (indicate cue type and reason): difficulty weight shifting due to painful LE and weakness                Extremity/Trunk Assessment Upper Extremity Assessment Upper Extremity Assessment: Generalized weakness;RUE deficits/detail;LUE deficits/detail RUE Deficits /  Details: limited end range for shoulder flexion.  Grossly 3+/5 RUE Sensation: WNL RUE Coordination: WNL LUE Deficits / Details: limited end range for shoulder flexion. Grossly 3+/5 LUE Sensation: WNL LUE Coordination: WNL   Lower Extremity Assessment Lower Extremity Assessment:  Defer to PT evaluation   Cervical / Trunk Assessment Cervical / Trunk Assessment: Kyphotic                      Cognition Arousal/Alertness: Awake/alert Behavior During Therapy: WFL for tasks assessed/performed Overall Cognitive Status: History of cognitive impairments - at baseline                                                             Pertinent Vitals/ Pain       Pain Assessment Pain Assessment: Faces Faces Pain Scale: Hurts little more Pain Location: R lower leg Pain Descriptors / Indicators: Tender Pain Intervention(s): Monitored during session                                                          Frequency  Min 2X/week        Progress Toward Goals  OT Goals(current goals can now be found in the care plan section)     Acute Rehab OT Goals OT Goal Formulation: With patient Time For Goal Achievement: 10/20/22 Potential to Achieve Goals: Good  Plan Discharge plan remains appropriate    Co-evaluation                 AM-PAC OT "6 Clicks" Daily Activity     Outcome Measure   Help from another person eating meals?: None Help from another person taking care of personal grooming?: None Help from another person toileting, which includes using toliet, bedpan, or urinal?: A Lot Help from another person bathing (including washing, rinsing, drying)?: A Lot Help from another person to put on and taking off regular upper body clothing?: A Little Help from another person to put on and taking off regular lower body clothing?: A Lot 6 Click Score: 17    End of Session Equipment Utilized During Treatment: Rolling walker (2 wheels)  OT Visit Diagnosis: Unsteadiness on feet (R26.81);Muscle weakness (generalized) (M62.81);Pain Pain - Right/Left: Right Pain - part of body: Leg   Activity Tolerance Patient tolerated treatment well   Patient Left in chair;with call bell/phone within reach;with chair  alarm set   Nurse Communication Mobility status        Time: 1000-1017 OT Time Calculation (min): 17 min  Charges: OT General Charges $OT Visit: 1 Visit OT Treatments $Self Care/Home Management : 8-22 mins  10/09/2022  RP, OTR/L  Acute Rehabilitation Services  Office:  [phone]   Metta Clines 10/09/2022, 10:22 AM

## 2022-10-09 NOTE — Discharge Summary (Signed)
Physician Discharge Summary  [name] XBM:[id number] DOB: [birth year] DOA: 10/04/2022  PCP: Gaynelle Arabian, MD  Admit date: 10/04/2022 Discharge date: 10/09/2022  Admitted From: Home Disposition: Home  Recommendations for Outpatient Follow-up:  Follow up with PCP in 1-2 weeks Continue Keflex to complete antibiotic course of 10 days for right lower extremity cellulitis Encourage leg elevation while sitting in a chair or lying in the bed Given patient advanced age, comorbidities recommend continue goals of care discussion outpatient with PCP.  Home Health: PT/OT/RN/aide Equipment/Devices: None  Discharge Condition: Stable CODE STATUS: Full code Diet recommendation: Heart healthy/consistent carb regular diet  History of present illness:  [name] is a 89 y.o. female with past medical history significant for CAD s/p CABG, aortic valve disease s/p mechanical AVR, paroxysmal atrial fibrillation on Coumadin, HTN, HLD, DM2, history of GI bleed, CKD stage IIIb, chronic diastolic CHF who presented to [facility] ED on 1/13 with complaints of right lower extremity pain, swelling and erythema.  Reports a fall few days prior to admission.   In the ED, temperature 98.6 F, HR 92, RR 22, BP 128/55, SpO2 99% on room air. Sodium 134, potassium 4.0, chloride 102, CO2 21, glucose 128, BUN 29, creatinine 1.68.  AST 69, ALT 51, total bilirubin 0.9.  BNP 236.3.  High sensitive troponin 13 followed by 13.  Lactic acid 2.0.  WBC 4.6, hemoglobin 7.3, platelets 228.  COVID-19 PCR positive.  Influenza A/B/RSV PCR negative.  CT head/C-spine without contrast with no acute intracranial abnormality, no acute displaced fracture or traumatic listhesis of the C-spine. CT Abdo/pelvis with no acute findings in the abdomen/pelvis, no evidence of retroperitoneal hematoma.  Chest x-ray with questionable lingular airspace opacity.  Right tib/fib x-ray with diffuse edema without evidence of soft tissue gas or new foreign body.  CT right lower extremity with circumferential subcutaneous edema and ill-defined fluid of the lower leg and foot nonspecific but consistent with cellulitis, no organized or drainable fluid collection, no deep fascial fluid, no soft tissue gas, no acute osseous abnormality.  Patient was started on empiric IV antibiotics for concern for cellulitis.  Peripheral blood cultures x 2 ordered.  TRH consulted for further evaluation management of right lower extremity cellulitis.  Hospital course:  Right lower extremity cellulitis, POA Patient presenting with progressive swelling, redness, pain to right lower extremity. Right tib/fib x-ray with diffuse edema without evidence of soft tissue gas or new foreign body.  CT right lower extremity with circumferential subcutaneous edema and ill-defined fluid of the lower leg and foot nonspecific but consistent with cellulitis, no organized or drainable fluid collection, no deep fascial fluid, no soft tissue gas, no acute osseous abnormality.  D-dimer less than 0.27.  Patient was initially started on vancomycin and subsidy transition to cefazolin while inpatient.  Patient's symptoms improved and will discharge on Keflex 500 mg p.o. every 8 hours for total antibiotic course of 10 days.  Recommend elevation of right lower extremity when able.  Outpatient follow-up with PCP.   COVID-19 viral infection Received 2 doses of remdesivir, now discontinued.  Oxygenating well on room air.  Blood loss anemia Hx iron deficiency anemia Hx gastric ulcers Baseline hemoglobin 11, hemoglobin admission 7.3.  Patient with history of nonbleeding gastric ulcers noted on endoscopy March 2021.  Patient currently on Coumadin for mechanical aortic valve and paroxysmal atrial fibrillation.  Case was discussed with GI, Dr. Benson Norway with recommendation of conservative managements, blood transfusions and continue take Coumadin.  Iron studies with low iron saturation,  borderline low ferritin.  Received 1  dose of IV iron.  Transfuse 1 unit PRBC on 1/14 and 1/16.  9.2 at time of discharge.  Recommend repeat CBC 1 week.   CAD CABG/PCI Continue atorvastatin 80 mg p.o. daily, Ezetimibe 10 mg p.o. daily, Imdur 60 mg p.o. daily   Paroxysmal atrial fibrillation on Coumadin Hx aortic valve disease s/p mechanical AVR Continue Amiodarone 200 mg p.o. daily, warfarin.  INR 2.5 at time of discharge.   Essential Hypertension Continue losartan 50 mg p.o. daily, Imdur 60 mg p.o. daily   Type 2 diabetes mellitus Hemoglobin A1c 6.1 on 05/30/2022, well-controlled.  Diet controlled at baseline.   Hyperlipidemia Continue Atorvastatin 80 mg p.o. daily, Ezetimibe 10 mg p.o. daily   Chronic diastolic congestive heart failure, compensated Most recent TTE 11/30/2018 with LVEF 60 to 65%. Continue losartan 50 mg p.o. daily, Furosemide 40 mg p.o. daily  Discharge Diagnoses:  Principal Problem:   Cellulitis    Discharge Instructions  Discharge Instructions     Call MD for:  difficulty breathing, headache or visual disturbances   Complete by: As directed    Call MD for:  extreme fatigue   Complete by: As directed    Call MD for:  persistant dizziness or light-headedness   Complete by: As directed    Call MD for:  persistant nausea and vomiting   Complete by: As directed    Call MD for:  severe uncontrolled pain   Complete by: As directed    Call MD for:  temperature >100.4   Complete by: As directed    Diet - low sodium heart healthy   Complete by: As directed    Increase activity slowly   Complete by: As directed       Allergies as of 10/09/2022       Reactions   Darvon [propoxyphene] Nausea And Vomiting   Lisinopril Cough   Septra [sulfamethoxazole-trimethoprim] Other (See Comments)   Increased INR   Warfarin And Related Other (See Comments)   ONLY TOLERATES BRAND   Trazodone And Nefazodone    Severe sweating   Zolpidem Tartrate    Hallucinations   Penicillins Rash   Did it involve  swelling of the face/tongue/throat, SOB, or low BP? No Did it involve sudden or severe rash/hives, skin peeling, or any reaction on the inside of your mouth or nose? No Did you need to seek medical attention at a hospital or doctor's office? No When did it last happen?  15 years     If all above answers are "NO", may proceed with cephalosporin use.        Medication List     STOP taking these medications    docusate sodium 100 MG capsule Commonly known as: COLACE       TAKE these medications    amiodarone 200 MG tablet Commonly known as: PACERONE Take 1 tablet by mouth once daily   atorvastatin 80 MG tablet Commonly known as: LIPITOR Take 1 tablet by mouth once daily   AVASTIN IV Inject 1 Dose into the vein every 8 (eight) weeks.   cephALEXin 500 MG capsule Commonly known as: KEFLEX Take 1 capsule (500 mg total) by mouth every 8 (eight) hours for 5 days.   ezetimibe 10 MG tablet Commonly known as: ZETIA Take 1 tablet by mouth once daily   Fish Oil 600 MG Caps Take 1,200 mg by mouth daily.   folic acid 683 MCG tablet Commonly known as: FOLVITE Take 400  mcg by mouth every evening.   furosemide 40 MG tablet Commonly known as: LASIX Take 1 tablet (40 mg total) by mouth 2 (two) times daily as needed. What changed: when to take this   isosorbide mononitrate 60 MG 24 hr tablet Commonly known as: IMDUR Take 1 tablet by mouth once daily   losartan 50 MG tablet Commonly known as: COZAAR Take 50 mg by mouth daily.   multivitamin with minerals Tabs tablet Take 1 tablet by mouth daily.   nitroGLYCERIN 0.4 MG SL tablet Commonly known as: NITROSTAT DISSOLVE ONE TABLET UNDER THE TONGUE EVERY 5 MINUTES AS NEEDED FOR CHEST PAIN.  DO NOT EXCEED A TOTAL OF 3 DOSES IN 15 MINUTES What changed: See the new instructions.   PRESERVISION AREDS 2 PO Take 1 tablet by mouth 2 (two) times daily.   Se-Tan PLUS 162-115.2-1 MG Caps Take 1 capsule by mouth 2 (two) times  daily.   warfarin 2.5 MG tablet Commonly known as: Jantoven Take as directed. If you are unsure how to take this medication, talk to your nurse or doctor. Original instructions: TAKE 1/2 TABLET TO 1 TABLET BY MOUTH DAILY AS DIRECTED BY COUMADIN CLINIC What changed:  how much to take how to take this when to take this additional instructions        Follow-up Information     Gaynelle Arabian, MD. Schedule an appointment as soon as possible for a visit in 1 week(s).   Specialty: Family Medicine Contact information: [address] [place] [address] [phone]                Allergies  Allergen Reactions   Darvon [Propoxyphene] Nausea And Vomiting   Lisinopril Cough   Septra [Sulfamethoxazole-Trimethoprim] Other (See Comments)    Increased INR   Warfarin And Related Other (See Comments)    ONLY TOLERATES BRAND   Trazodone And Nefazodone     Severe sweating   Zolpidem Tartrate     Hallucinations   Penicillins Rash    Did it involve swelling of the face/tongue/throat, SOB, or low BP? No Did it involve sudden or severe rash/hives, skin peeling, or any reaction on the inside of your mouth or nose? No Did you need to seek medical attention at a hospital or doctor's office? No When did it last happen?  15 years     If all above answers are "NO", may proceed with cephalosporin use.    Consultations: None   Procedures/Studies: DG CHEST PORT 1 VIEW  Result Date: 10/07/2022 CLINICAL DATA:  Chest pain and shortness of breath COVID-19 positivity EXAM: PORTABLE CHEST 1 VIEW COMPARISON:  10/04/2022 FINDINGS: Cardiac shadow is enlarged but stable. Postsurgical changes are again seen. Lungs are hyperinflated. Previously seen nodular density is not well appreciated on this exam. No bony abnormality is seen. IMPRESSION: No active disease. Electronically Signed   By: Inez Catalina M.D.   On: 10/07/2022 23:32   CT EXTREMITY LOWER RIGHT WO CONTRAST  Result  Date: 10/05/2022 CLINICAL DATA:  Redness and swelling of the right lower leg EXAM: CT OF THE LOWER RIGHT EXTREMITY WITHOUT CONTRAST TECHNIQUE: Multidetector CT imaging of the right lower extremity was performed according to the standard protocol. RADIATION DOSE REDUCTION: This exam was performed according to the departmental dose-optimization program which includes automated exposure control, adjustment of the mA and/or kV according to patient size and/or use of iterative reconstruction technique. COMPARISON:  X-ray 10/05/2022 FINDINGS: Bones/Joint/Cartilage No acute fracture. No dislocation. No bone  erosion periosteal elevation. Mild-to-moderate osteoarthritic changes at the knee and mild degenerative changes within the foot. Trace knee joint effusion. No tibiotalar or subtalar joint effusion. No lytic or sclerotic bone lesion. Ligaments Suboptimally assessed by CT. Muscles and Tendons No acute musculotendinous abnormality by CT. No intramuscular fluid collection. No appreciable tenosynovial fluid collection. Soft tissues Circumferential subcutaneous edema and ill-defined fluid of the lower leg and foot. No organized or drainable fluid collection. No deep fascial fluid. No soft tissue gas. No discernible wound or ulceration. IMPRESSION: 1. Circumferential subcutaneous edema and ill-defined fluid of the lower leg and foot, nonspecific but may represent cellulitis in the appropriate clinical setting. No organized or drainable fluid collection. No deep fascial fluid. No soft tissue gas. 2. No acute osseous abnormality of the right lower extremity. 3. Mild-to-moderate osteoarthritic changes at the knee and mild degenerative changes within the foot. Trace knee joint effusion, likely degenerative. Electronically Signed   By: Davina Poke D.O.   On: 10/05/2022 09:08   DG Tibia/Fibula Right  Result Date: 10/05/2022 CLINICAL DATA:  Right lower extremity infection. EXAM: RIGHT TIBIA AND FIBULA - 2 VIEW COMPARISON:   Right ankle series AP Lat 12/01/2019, right knee AP Lat 12/01/2019 FINDINGS: There is diffuse edema, moderate in degree. Numerous surgical clips of the posteromedial upper foreleg. This was seen previously. The popliteal trifurcation arteries are heavily calcified. No new foreign body is seen. No soft tissue gas. There is osteopenia without evidence of fractures or destructive bone lesions. Mild degenerative arthrosis is noted of the knee and ankle and small plantar and dorsal calcaneal enthesopathic spurs. IMPRESSION: 1. Diffuse edema without evidence of soft tissue gas or new foreign body. 2. Osteopenia without evidence of fractures or lytic bone lesion. 3. Vascular calcifications. Electronically Signed   By: Telford Nab M.D.   On: 10/05/2022 02:07   CT Head Wo Contrast  Result Date: 10/05/2022 CLINICAL DATA:  Head trauma, minor (Age >= 65y); Neck trauma (Age >= 65y) EXAM: CT HEAD WITHOUT CONTRAST CT CERVICAL SPINE WITHOUT CONTRAST TECHNIQUE: Multidetector CT imaging of the head and cervical spine was performed following the standard protocol without intravenous contrast. Multiplanar CT image reconstructions of the cervical spine were also generated. RADIATION DOSE REDUCTION: This exam was performed according to the departmental dose-optimization program which includes automated exposure control, adjustment of the mA and/or kV according to patient size and/or use of iterative reconstruction technique. COMPARISON:  CT head 03/09/2022 FINDINGS: CT HEAD FINDINGS Brain: Cerebral ventricle sizes are concordant with the degree of cerebral volume loss. Patchy and confluent areas of decreased attenuation are noted throughout the deep and periventricular white matter of the cerebral hemispheres bilaterally, compatible with chronic microvascular ischemic disease. no evidence of large-territorial acute infarction. No parenchymal hemorrhage. No mass lesion. No extra-axial collection. No mass effect or midline shift. No  hydrocephalus. Basilar cisterns are patent. Vascular: No hyperdense vessel. Atherosclerotic calcifications are present within the cavernous internal carotid and vertebral arteries. Skull: No acute fracture or focal lesion. Sinuses/Orbits: Paranasal sinuses and mastoid air cells are clear. Bilateral lens replacement. Otherwise the orbits are unremarkable. Other: None. CT CERVICAL SPINE FINDINGS Alignment: Normal. Skull base and vertebrae: Mild multilevel degenerative changes of the spine. No acute fracture. No aggressive appearing focal osseous lesion or focal pathologic process. Soft tissues and spinal canal: No prevertebral fluid or swelling. No visible canal hematoma. Upper chest: Unremarkable. Other: Atherosclerotic plaque of the carotid arteries within the neck. IMPRESSION: 1. No acute intracranial abnormality. 2. No acute displaced fracture  or traumatic listhesis of the cervical spine. Electronically Signed   By: Iven Finn M.D.   On: 10/05/2022 01:44   CT Cervical Spine Wo Contrast  Result Date: 10/05/2022 CLINICAL DATA:  Head trauma, minor (Age >= 65y); Neck trauma (Age >= 65y) EXAM: CT HEAD WITHOUT CONTRAST CT CERVICAL SPINE WITHOUT CONTRAST TECHNIQUE: Multidetector CT imaging of the head and cervical spine was performed following the standard protocol without intravenous contrast. Multiplanar CT image reconstructions of the cervical spine were also generated. RADIATION DOSE REDUCTION: This exam was performed according to the departmental dose-optimization program which includes automated exposure control, adjustment of the mA and/or kV according to patient size and/or use of iterative reconstruction technique. COMPARISON:  CT head 03/09/2022 FINDINGS: CT HEAD FINDINGS Brain: Cerebral ventricle sizes are concordant with the degree of cerebral volume loss. Patchy and confluent areas of decreased attenuation are noted throughout the deep and periventricular white matter of the cerebral hemispheres  bilaterally, compatible with chronic microvascular ischemic disease. no evidence of large-territorial acute infarction. No parenchymal hemorrhage. No mass lesion. No extra-axial collection. No mass effect or midline shift. No hydrocephalus. Basilar cisterns are patent. Vascular: No hyperdense vessel. Atherosclerotic calcifications are present within the cavernous internal carotid and vertebral arteries. Skull: No acute fracture or focal lesion. Sinuses/Orbits: Paranasal sinuses and mastoid air cells are clear. Bilateral lens replacement. Otherwise the orbits are unremarkable. Other: None. CT CERVICAL SPINE FINDINGS Alignment: Normal. Skull base and vertebrae: Mild multilevel degenerative changes of the spine. No acute fracture. No aggressive appearing focal osseous lesion or focal pathologic process. Soft tissues and spinal canal: No prevertebral fluid or swelling. No visible canal hematoma. Upper chest: Unremarkable. Other: Atherosclerotic plaque of the carotid arteries within the neck. IMPRESSION: 1. No acute intracranial abnormality. 2. No acute displaced fracture or traumatic listhesis of the cervical spine. Electronically Signed   By: Iven Finn M.D.   On: 10/05/2022 01:44   CT ABDOMEN PELVIS WO CONTRAST  Result Date: 10/05/2022 CLINICAL DATA:  Abdominal trauma, blunt on warfarin, fall several days ago, new anemia, concern for retroperitoneal hematoma EXAM: CT ABDOMEN AND PELVIS WITHOUT CONTRAST TECHNIQUE: Multidetector CT imaging of the abdomen and pelvis was performed following the standard protocol without IV contrast. RADIATION DOSE REDUCTION: This exam was performed according to the departmental dose-optimization program which includes automated exposure control, adjustment of the mA and/or kV according to patient size and/or use of iterative reconstruction technique. COMPARISON:  02/12/2007 FINDINGS: Lower chest: Densely calcified coronary arteries and aorta. No acute abnormality. Hepatobiliary:  Multiple large gallstones layering within the gallbladder. No biliary ductal dilatation or focal hepatic abnormality. No evidence of a hepatic injury. Pancreas: No focal abnormality or ductal dilatation. Spleen: Normal size.  No focal abnormality or evidence of injury. Adrenals/Urinary Tract: Few small nonobstructing left renal stones. No ureteral stones or hydronephrosis. Adrenal glands and urinary bladder unremarkable. Stomach/Bowel: Stomach, large and small bowel grossly unremarkable. Vascular/Lymphatic: Heavily calcified aorta and iliac vessels. No evidence of aneurysm or adenopathy. Reproductive: Uterus and adnexa unremarkable.  No mass. Other: No free fluid or free air. Musculoskeletal: No acute bony abnormality. No evidence of retroperitoneal hematoma. IMPRESSION: No acute findings in the abdomen or pelvis. No evidence of retroperitoneal hematoma. Cholelithiasis. Left nephrolithiasis. Aortoiliac atherosclerosis. Electronically Signed   By: Rolm Baptise M.D.   On: 10/05/2022 01:39   DG Chest 2 View  Result Date: 10/04/2022 CLINICAL DATA:  SOB EXAM: CHEST - 2 VIEW COMPARISON:  Chest x-ray 11/03/2019 FINDINGS: Poorly defined left heart border.  Otherwise the heart and mediastinal contours are unchanged. Vascular clips overlie the mediastinum. Atherosclerotic plaque. Coronary artery stents. Query developing lingular airspace opacity. Chronic coarsened markings with no overt pulmonary edema. No pleural effusion. No pneumothorax. No acute osseous abnormality.  Intact sternotomy wires. IMPRESSION: 1. Query developing lingular airspace opacity. Followup PA and lateral chest X-ray is recommended in 3-4 weeks following therapy to ensure resolution and exclude underlying malignancy. 2.  Aortic Atherosclerosis (ICD10-I70.0). Electronically Signed   By: Iven Finn M.D.   On: 10/04/2022 20:00     Subjective: Patient seen examined bedside, resting comfortably.  Sitting in bedside chair.  No specific complaints  morning.  Ready for discharge home.  Discussed with patient will continue oral antibiotics on discharge for additional 5 days to complete a 10-day total course.  Also discussed with patient needs to have right lower extremity elevated when sitting or lying in the bed.  No other questions or concerns at this time.  Denies headache, no dizziness, no chest pain, no palpitations, no shortness of breath, no abdominal pain, no focal weakness, no cough/congestion, no fever/chills/night sweats, no nausea cefonicid diarrhea, no paresthesias.  No acute events overnight per nursing staff.  Discharge Exam: Vitals:   10/09/22 0503 10/09/22 0915  BP: (!) 147/74 138/76  Pulse: 77 76  Resp: 18 18  Temp: (!) 97.5 F (36.4 C) 98 F (36.7 C)  SpO2: 100% 100%   Vitals:   10/08/22 1712 10/08/22 2152 10/09/22 0503 10/09/22 0915  BP: (!) 121/47 126/63 (!) 147/74 138/76  Pulse: 76 88 77 76  Resp: '16 18 18 18  '$ Temp: (!) 97.4 F (36.3 C) 98 F (36.7 C) (!) 97.5 F (36.4 C) 98 F (36.7 C)  TempSrc: Oral Oral Oral Oral  SpO2: 99% 98% 100% 100%  Weight:      Height:        Physical Exam: GEN: NAD, alert and oriented x 3, chronically ill/elderly in appearance HEENT: NCAT, PERRL, EOMI, sclera clear, MMM PULM: CTAB w/o wheezes/crackles, normal respiratory effort, on room air CV: RRR w/o M/G/R GI: abd soft, NTND, NABS, no R/G/M MSK: Right lower extremity with mild edema with surrounding erythema as depicted below, muscle strength globally intact with normal passive/range of motion NEURO: CN II-XII intact, no focal deficits, sensation to light touch intact PSYCH: normal mood/affect Integumentary: Right lower extremity with erythema to mid shin as depicted below, otherwise no other concerning rashes/lesions/wounds noted on exposed skin surfaces      The results of significant diagnostics from this hospitalization (including imaging, microbiology, ancillary and laboratory) are listed below for reference.      Microbiology: Recent Results (from the past 240 hour(s))  Resp panel by RT-PCR (RSV, Flu A&B, Covid) Anterior Nasal Swab     Status: Abnormal   Collection Time: 10/05/22  3:03 AM   Specimen: Anterior Nasal Swab  Result Value Ref Range Status   SARS Coronavirus 2 by RT PCR POSITIVE (A) NEGATIVE Final    Comment: (NOTE) SARS-CoV-2 target nucleic acids are DETECTED.  The SARS-CoV-2 RNA is generally detectable in upper respiratory specimens during the acute phase of infection. Positive results are indicative of the presence of the identified virus, but do not rule out bacterial infection or co-infection with other pathogens not detected by the test. Clinical correlation with patient history and other diagnostic information is necessary to determine patient infection status. The expected result is Negative.  Fact Sheet for Patients: EntrepreneurPulse.com.au  Fact Sheet for Healthcare Providers: IncredibleEmployment.be  This test is not yet approved or cleared by the [place] and  has been authorized for detection and/or diagnosis of SARS-CoV-2 by FDA under an Emergency Use Authorization (EUA).  This EUA will remain in effect (meaning this test can be used) for the duration of  the COVID-19 declaration under Section 564(b)(1) of the A ct, 21 U.S.C. section 360bbb-3(b)(1), unless the authorization is terminated or revoked sooner.     Influenza A by PCR NEGATIVE NEGATIVE Final   Influenza B by PCR NEGATIVE NEGATIVE Final    Comment: (NOTE) The Xpert Xpress SARS-CoV-2/FLU/RSV plus assay is intended as an aid in the diagnosis of influenza from Nasopharyngeal swab specimens and should not be used as a sole basis for treatment. Nasal washings and aspirates are unacceptable for Xpert Xpress SARS-CoV-2/FLU/RSV testing.  Fact Sheet for Patients: EntrepreneurPulse.com.au  Fact Sheet for Healthcare  Providers: IncredibleEmployment.be  This test is not yet approved or cleared by the [place] FDA and has been authorized for detection and/or diagnosis of SARS-CoV-2 by FDA under an Emergency Use Authorization (EUA). This EUA will remain in effect (meaning this test can be used) for the duration of the COVID-19 declaration under Section 564(b)(1) of the Act, 21 U.S.C. section 360bbb-3(b)(1), unless the authorization is terminated or revoked.     Resp Syncytial Virus by PCR NEGATIVE NEGATIVE Final    Comment: (NOTE) Fact Sheet for Patients: EntrepreneurPulse.com.au  Fact Sheet for Healthcare Providers: IncredibleEmployment.be  This test is not yet approved or cleared by the [place] FDA and has been authorized for detection and/or diagnosis of SARS-CoV-2 by FDA under an Emergency Use Authorization (EUA). This EUA will remain in effect (meaning this test can be used) for the duration of the COVID-19 declaration under Section 564(b)(1) of the Act, 21 U.S.C. section 360bbb-3(b)(1), unless the authorization is terminated or revoked.  Performed at [facility], [place] [address], [place], [place] [address]   Culture, blood (Routine X 2) w Reflex to ID Panel     Status: None (Preliminary result)   Collection Time: 10/05/22 11:05 AM   Specimen: BLOOD  Result Value Ref Range Status   Specimen Description BLOOD LEFT ANTECUBITAL  Final   Special Requests   Final    BOTTLES DRAWN AEROBIC ONLY Blood Culture results may not be optimal due to an inadequate volume of blood received in culture bottles   Culture   Final    NO GROWTH 3 DAYS Performed at [facility], [place] [address] [place], [place] [address]    Report Status PENDING  Incomplete  Culture, blood (Routine X 2) w Reflex to ID Panel     Status: None (Preliminary result)   Collection Time: 10/05/22 11:25 AM   Specimen: BLOOD RIGHT HAND  Result Value Ref  Range Status   Specimen Description BLOOD RIGHT HAND  Final   Special Requests   Final    BOTTLES DRAWN AEROBIC ONLY Blood Culture results may not be optimal due to an inadequate volume of blood received in culture bottles   Culture   Final    NO GROWTH 3 DAYS Performed at [facility], [place] [address], [place], [place] [address]    Report Status PENDING  Incomplete     Labs: BNP (last 3 results) Recent Labs    10/04/22 1910  BNP 573.2*   Basic Metabolic Panel: Recent Labs  Lab 10/04/22 1908 10/05/22 0608 10/06/22 0702 10/07/22 0732 10/08/22 0859  NA 134* 133*  133* 133* 133*  K 4.0 3.6 3.5 3.4* 4.6  CL 102 100 103 102 102  CO2 21* 20* '24 23 24  '$ GLUCOSE 128* 135* 102* 98 74  BUN 29* 28* 29* 30* 32*  CREATININE 1.68* 1.68* 1.64* 1.48* 1.50*  CALCIUM 8.4* 8.1* 8.0* 8.1* 8.1*  MG  --  2.1 2.2 2.3 2.4  PHOS  --  3.7  --   --   --    Liver Function Tests: Recent Labs  Lab 10/04/22 1908 10/05/22 0608  AST 69* 546*  ALT 51* 311*  ALKPHOS 45 43  BILITOT 0.9 1.8*  PROT 6.0* 5.4*  ALBUMIN 3.3* 2.7*   No results for input(s): "LIPASE", "AMYLASE" in the last 168 hours. No results for input(s): "AMMONIA" in the last 168 hours. CBC: Recent Labs  Lab 10/04/22 1908 10/05/22 0608 10/06/22 0702 10/07/22 0732 10/08/22 0859  WBC 4.6 8.8 9.8 11.6* 7.3  NEUTROABS 3.9  --   --   --   --   HGB 7.3* 7.6* 7.4* 7.3* 9.2*  HCT 25.3* 24.8* 23.2* 22.8* 28.5*  MCV 109.5* 104.2* 103.1* 100.9* 96.6  PLT 228 202 193 204 194   Cardiac Enzymes: No results for input(s): "CKTOTAL", "CKMB", "CKMBINDEX", "TROPONINI" in the last 168 hours. BNP: Invalid input(s): "POCBNP" CBG: Recent Labs  Lab 10/08/22 1119 10/08/22 1615 10/08/22 1714 10/08/22 2151 10/09/22 0743  GLUCAP 133* 183* 152* 132* 114*   D-Dimer No results for input(s): "DDIMER" in the last 72 hours. Hgb A1c No results for input(s): "HGBA1C" in the last 72 hours. Lipid Profile No results for input(s): "CHOL",  "HDL", "LDLCALC", "TRIG", "CHOLHDL", "LDLDIRECT" in the last 72 hours. Thyroid function studies No results for input(s): "TSH", "T4TOTAL", "T3FREE", "THYROIDAB" in the last 72 hours.  Invalid input(s): "FREET3" Anemia work up No results for input(s): "VITAMINB12", "FOLATE", "FERRITIN", "TIBC", "IRON", "RETICCTPCT" in the last 72 hours. Urinalysis    Component Value Date/Time   COLORURINE YELLOW 11/28/2018 0058   APPEARANCEUR Cloudy (A) 11/14/2019 1008   LABSPEC 1.014 11/28/2018 0058   PHURINE 6.0 11/28/2018 0058   GLUCOSEU Negative 11/14/2019 1008   HGBUR MODERATE (A) 11/28/2018 0058   BILIRUBINUR Negative 11/14/2019 1008   KETONESUR NEGATIVE 11/28/2018 0058   PROTEINUR Trace (A) 11/14/2019 1008   PROTEINUR 30 (A) 11/28/2018 0058   UROBILINOGEN 0.2 04/20/2008 2135   NITRITE Negative 11/14/2019 1008   NITRITE POSITIVE (A) 11/28/2018 0058   LEUKOCYTESUR Trace (A) 11/14/2019 1008   LEUKOCYTESUR MODERATE (A) 11/28/2018 0058   Sepsis Labs Recent Labs  Lab 10/05/22 0608 10/06/22 0702 10/07/22 0732 10/08/22 0859  WBC 8.8 9.8 11.6* 7.3   Microbiology Recent Results (from the past 240 hour(s))  Resp panel by RT-PCR (RSV, Flu A&B, Covid) Anterior Nasal Swab     Status: Abnormal   Collection Time: 10/05/22  3:03 AM   Specimen: Anterior Nasal Swab  Result Value Ref Range Status   SARS Coronavirus 2 by RT PCR POSITIVE (A) NEGATIVE Final    Comment: (NOTE) SARS-CoV-2 target nucleic acids are DETECTED.  The SARS-CoV-2 RNA is generally detectable in upper respiratory specimens during the acute phase of infection. Positive results are indicative of the presence of the identified virus, but do not rule out bacterial infection or co-infection with other pathogens not detected by the test. Clinical correlation with patient history and other diagnostic information is necessary to determine patient infection status. The expected result is Negative.  Fact Sheet for  Patients: EntrepreneurPulse.com.au  Fact Sheet for Healthcare  Providers: IncredibleEmployment.be  This test is not yet approved or cleared by the [place] and  has been authorized for detection and/or diagnosis of SARS-CoV-2 by FDA under an Emergency Use Authorization (EUA).  This EUA will remain in effect (meaning this test can be used) for the duration of  the COVID-19 declaration under Section 564(b)(1) of the A ct, 21 U.S.C. section 360bbb-3(b)(1), unless the authorization is terminated or revoked sooner.     Influenza A by PCR NEGATIVE NEGATIVE Final   Influenza B by PCR NEGATIVE NEGATIVE Final    Comment: (NOTE) The Xpert Xpress SARS-CoV-2/FLU/RSV plus assay is intended as an aid in the diagnosis of influenza from Nasopharyngeal swab specimens and should not be used as a sole basis for treatment. Nasal washings and aspirates are unacceptable for Xpert Xpress SARS-CoV-2/FLU/RSV testing.  Fact Sheet for Patients: EntrepreneurPulse.com.au  Fact Sheet for Healthcare Providers: IncredibleEmployment.be  This test is not yet approved or cleared by the [place] FDA and has been authorized for detection and/or diagnosis of SARS-CoV-2 by FDA under an Emergency Use Authorization (EUA). This EUA will remain in effect (meaning this test can be used) for the duration of the COVID-19 declaration under Section 564(b)(1) of the Act, 21 U.S.C. section 360bbb-3(b)(1), unless the authorization is terminated or revoked.     Resp Syncytial Virus by PCR NEGATIVE NEGATIVE Final    Comment: (NOTE) Fact Sheet for Patients: EntrepreneurPulse.com.au  Fact Sheet for Healthcare Providers: IncredibleEmployment.be  This test is not yet approved or cleared by the [place] FDA and has been authorized for detection and/or diagnosis of SARS-CoV-2 by FDA under an Emergency Use  Authorization (EUA). This EUA will remain in effect (meaning this test can be used) for the duration of the COVID-19 declaration under Section 564(b)(1) of the Act, 21 U.S.C. section 360bbb-3(b)(1), unless the authorization is terminated or revoked.  Performed at [facility], [place] [address] [place], [place] [address]   Culture, blood (Routine X 2) w Reflex to ID Panel     Status: None (Preliminary result)   Collection Time: 10/05/22 11:05 AM   Specimen: BLOOD  Result Value Ref Range Status   Specimen Description BLOOD LEFT ANTECUBITAL  Final   Special Requests   Final    BOTTLES DRAWN AEROBIC ONLY Blood Culture results may not be optimal due to an inadequate volume of blood received in culture bottles   Culture   Final    NO GROWTH 3 DAYS Performed at [facility], [place] [address] [place], [place] [address]    Report Status PENDING  Incomplete  Culture, blood (Routine X 2) w Reflex to ID Panel     Status: None (Preliminary result)   Collection Time: 10/05/22 11:25 AM   Specimen: BLOOD RIGHT HAND  Result Value Ref Range Status   Specimen Description BLOOD RIGHT HAND  Final   Special Requests   Final    BOTTLES DRAWN AEROBIC ONLY Blood Culture results may not be optimal due to an inadequate volume of blood received in culture bottles   Culture   Final    NO GROWTH 3 DAYS Performed at [facility], [place] [address] [place], [place] [address]    Report Status PENDING  Incomplete     Time coordinating discharge: Over 30 minutes  SIGNED:   Estefanny Moler J [place] DO  [facility] 10/09/2022, 10:10 AM

## 2022-10-10 ENCOUNTER — Emergency Department (HOSPITAL_COMMUNITY)
Admission: EM | Admit: 2022-10-10 | Discharge: 2022-10-14 | Disposition: A | Discharge status: Skilled Nursing Facility | Payer: Medicare Other | Attending: Student | Admitting: Student

## 2022-10-10 ENCOUNTER — Other Ambulatory Visit: Payer: Self-pay

## 2022-10-10 DIAGNOSIS — I2510 Atherosclerotic heart disease of native coronary artery without angina pectoris: Secondary | ICD-10-CM | POA: Diagnosis not present

## 2022-10-10 DIAGNOSIS — I5032 Chronic diastolic (congestive) heart failure: Secondary | ICD-10-CM | POA: Insufficient documentation

## 2022-10-10 DIAGNOSIS — Z7901 Long term (current) use of anticoagulants: Secondary | ICD-10-CM | POA: Diagnosis not present

## 2022-10-10 DIAGNOSIS — I130 Hypertensive heart and chronic kidney disease with heart failure and stage 1 through stage 4 chronic kidney disease, or unspecified chronic kidney disease: Secondary | ICD-10-CM | POA: Diagnosis not present

## 2022-10-10 DIAGNOSIS — R350 Frequency of micturition: Secondary | ICD-10-CM | POA: Diagnosis not present

## 2022-10-10 DIAGNOSIS — D649 Anemia, unspecified: Secondary | ICD-10-CM | POA: Insufficient documentation

## 2022-10-10 DIAGNOSIS — E1122 Type 2 diabetes mellitus with diabetic chronic kidney disease: Secondary | ICD-10-CM | POA: Insufficient documentation

## 2022-10-10 DIAGNOSIS — R5381 Other malaise: Secondary | ICD-10-CM

## 2022-10-10 DIAGNOSIS — N184 Chronic kidney disease, stage 4 (severe): Secondary | ICD-10-CM | POA: Diagnosis not present

## 2022-10-10 DIAGNOSIS — Z20822 Contact with and (suspected) exposure to covid-19: Secondary | ICD-10-CM | POA: Diagnosis not present

## 2022-10-10 DIAGNOSIS — Z951 Presence of aortocoronary bypass graft: Secondary | ICD-10-CM | POA: Insufficient documentation

## 2022-10-10 DIAGNOSIS — M1990 Unspecified osteoarthritis, unspecified site: Secondary | ICD-10-CM | POA: Diagnosis not present

## 2022-10-10 DIAGNOSIS — Z79899 Other long term (current) drug therapy: Secondary | ICD-10-CM | POA: Insufficient documentation

## 2022-10-10 DIAGNOSIS — R609 Edema, unspecified: Secondary | ICD-10-CM | POA: Diagnosis not present

## 2022-10-10 DIAGNOSIS — R54 Age-related physical debility: Secondary | ICD-10-CM | POA: Diagnosis not present

## 2022-10-10 LAB — COMPREHENSIVE METABOLIC PANEL
ALT: 38 U/L (ref 0–44)
AST: 93 U/L — ABNORMAL HIGH (ref 15–41)
Albumin: 3 g/dL — ABNORMAL LOW (ref 3.5–5.0)
Alkaline Phosphatase: 81 U/L (ref 38–126)
Anion gap: 9 (ref 5–15)
BUN: 32 mg/dL — ABNORMAL HIGH (ref 8–23)
CO2: 27 mmol/L (ref 22–32)
Calcium: 8.3 mg/dL — ABNORMAL LOW (ref 8.9–10.3)
Chloride: 98 mmol/L (ref 98–111)
Creatinine, Ser: 1.52 mg/dL — ABNORMAL HIGH (ref 0.44–1.00)
GFR, Estimated: 33 mL/min — ABNORMAL LOW (ref >60)
Glucose, Bld: 99 mg/dL (ref 70–99)
Potassium: 4.3 mmol/L (ref 3.5–5.1)
Sodium: 134 mmol/L — ABNORMAL LOW (ref 135–145)
Total Bilirubin: 0.9 mg/dL (ref 0.3–1.2)
Total Protein: 6.2 g/dL — ABNORMAL LOW (ref 6.5–8.1)

## 2022-10-10 LAB — CBC WITH DIFFERENTIAL/PLATELET
Abs Immature Granulocytes: 0.05 10*3/uL (ref 0.00–0.07)
Basophils Absolute: 0 10*3/uL (ref 0.0–0.1)
Basophils Relative: 0 %
Eosinophils Absolute: 0.2 10*3/uL (ref 0.0–0.5)
Eosinophils Relative: 3 %
HCT: 31.6 % — ABNORMAL LOW (ref 36.0–46.0)
Hemoglobin: 9.7 g/dL — ABNORMAL LOW (ref 12.0–15.0)
Immature Granulocytes: 1 %
Lymphocytes Relative: 16 %
Lymphs Abs: 0.8 10*3/uL (ref 0.7–4.0)
MCH: 30.6 pg (ref 26.0–34.0)
MCHC: 30.7 g/dL (ref 30.0–36.0)
MCV: 99.7 fL (ref 80.0–100.0)
Monocytes Absolute: 0.7 10*3/uL (ref 0.1–1.0)
Monocytes Relative: 14 %
Neutro Abs: 3.4 10*3/uL (ref 1.7–7.7)
Neutrophils Relative %: 66 %
Platelets: 234 10*3/uL (ref 150–400)
RBC: 3.17 MIL/uL — ABNORMAL LOW (ref 3.87–5.11)
RDW: 17.7 % — ABNORMAL HIGH (ref 11.5–15.5)
WBC: 5.2 10*3/uL (ref 4.0–10.5)
nRBC: 0 % (ref 0.0–0.2)

## 2022-10-10 LAB — CULTURE, BLOOD (ROUTINE X 2)
Culture: NO GROWTH
Culture: NO GROWTH

## 2022-10-10 LAB — PROTIME-INR
INR: 2.2 — ABNORMAL HIGH (ref 0.8–1.2)
Prothrombin Time: 24.1 seconds — ABNORMAL HIGH (ref 11.4–15.2)

## 2022-10-10 LAB — SARS CORONAVIRUS 2 BY RT PCR: SARS Coronavirus 2 by RT PCR: NEGATIVE

## 2022-10-10 MED ORDER — LOSARTAN POTASSIUM 25 MG PO TABS
50.0000 mg | ORAL_TABLET | Freq: Every day | ORAL | Status: DC
  Administered 2022-10-12 – 2022-10-14 (×3): 50 mg via ORAL
  Filled 2022-10-10 (×5): qty 2

## 2022-10-10 MED ORDER — WARFARIN SODIUM 2.5 MG PO TABS: 2.5000 mg | ORAL_TABLET | ORAL | Status: DC

## 2022-10-10 MED ORDER — EZETIMIBE 10 MG PO TABS
10.0000 mg | ORAL_TABLET | Freq: Every day | ORAL | Status: DC
  Administered 2022-10-11 – 2022-10-14 (×4): 10 mg via ORAL
  Filled 2022-10-10 (×4): qty 1

## 2022-10-10 MED ORDER — LORAZEPAM 0.5 MG PO TABS
0.5000 mg | ORAL_TABLET | Freq: Two times a day (BID) | ORAL | Status: DC | PRN
  Administered 2022-10-10 – 2022-10-14 (×7): 0.5 mg via ORAL
  Filled 2022-10-10 (×7): qty 1

## 2022-10-10 MED ORDER — FUROSEMIDE 40 MG PO TABS
40.0000 mg | ORAL_TABLET | Freq: Every day | ORAL | Status: DC
  Administered 2022-10-10 – 2022-10-14 (×5): 40 mg via ORAL
  Filled 2022-10-10 (×5): qty 1

## 2022-10-10 MED ORDER — WARFARIN 1.25 MG HALF TABLET
1.2500 mg | ORAL_TABLET | ORAL | Status: DC
  Administered 2022-10-10 – 2022-10-13 (×4): 1.25 mg via ORAL
  Filled 2022-10-10 (×5): qty 1

## 2022-10-10 MED ORDER — AMIODARONE HCL 200 MG PO TABS
200.0000 mg | ORAL_TABLET | Freq: Every day | ORAL | Status: DC
  Administered 2022-10-10 – 2022-10-14 (×5): 200 mg via ORAL
  Filled 2022-10-10 (×5): qty 1

## 2022-10-10 MED ORDER — CEPHALEXIN 500 MG PO CAPS
500.0000 mg | ORAL_CAPSULE | Freq: Three times a day (TID) | ORAL | Status: DC
  Administered 2022-10-10 – 2022-10-14 (×12): 500 mg via ORAL
  Filled 2022-10-10 (×12): qty 1

## 2022-10-10 MED ORDER — ISOSORBIDE MONONITRATE ER 60 MG PO TB24
60.0000 mg | ORAL_TABLET | Freq: Every day | ORAL | Status: DC
  Administered 2022-10-11 – 2022-10-14 (×4): 60 mg via ORAL
  Filled 2022-10-10 (×4): qty 1

## 2022-10-10 MED ORDER — WARFARIN 1.25 MG HALF TABLET: 1.2500 mg | ORAL_TABLET | ORAL | Status: DC

## 2022-10-10 MED ORDER — WARFARIN 1.25 MG HALF TABLET
1.2500 mg | ORAL_TABLET | ORAL | Status: DC
  Filled 2022-10-10: qty 1

## 2022-10-10 MED ORDER — WARFARIN - PHYSICIAN DOSING INPATIENT
Freq: Every day | Status: DC
  Administered 2022-10-13: 1.25

## 2022-10-10 MED ORDER — ATORVASTATIN CALCIUM 40 MG PO TABS
80.0000 mg | ORAL_TABLET | Freq: Every day | ORAL | Status: DC
  Administered 2022-10-11 – 2022-10-14 (×4): 80 mg via ORAL
  Filled 2022-10-10 (×5): qty 2

## 2022-10-10 NOTE — Progress Notes (Signed)
This CSW has made the husband aware of the barrier of the patient going to [facility]. This CSW also made the patient aware that the patient will stay here until placement.

## 2022-10-10 NOTE — ED Triage Notes (Signed)
Pt BIBA from home. Pt dc/d from [facility] yesterday. Placement had been recommended, but partner felt they could take care of pt themselves. This turned out to not be the case and EMS was called and pt brought here so that placement could be found. No new complaints since dc yesterday.  Aox4

## 2022-10-10 NOTE — Progress Notes (Signed)
CSW has spoken to [facility], Mrs. Jerline Pain stated that they could take the patient after the patient 10 days were up post her positive Covid test. This CSW has made the day time CSW aware of this barrier. This CSW will also make the family aware of this. TOC will continue to follow.

## 2022-10-10 NOTE — NC FL2 (Signed)
[place] LEVEL OF CARE FORM     IDENTIFICATION  Patient Name: [name] Birthdate: July 25, 1933 Sex: female Admission Date (Current Location): 10/10/2022  [facility] and [place] Number:  Herbalist and Address:  [facility],  [place] [address], [place]      Provider Number: [id number]  Attending Physician Name and Address:  Teressa Lower, MD  Relative Name and Phone Number:       Current Level of Care: Hospital Recommended Level of Care: [place] Prior Approval Number:    Date Approved/Denied: 10/10/22 PASRR Number: [id number]  Discharge Plan: SNF    Current Diagnoses: Patient Active Problem List   Diagnosis Date Noted   Cellulitis 10/05/2022   Lumbar herniated disc 06/02/2022   Arteriovenous malformation of digestive system vessel (CODE) 05/12/2022   Congestive heart failure ([place]) 05/12/2022   Diabetic renal disease ([place]) 05/12/2022   Exudative age-related macular degeneration ([place]) 05/12/2022   Gout 05/12/2022   Hypertensive heart and chronic kidney disease with heart failure and stage 1 through stage 4 chronic kidney disease, or unspecified chronic kidney disease ([place]) 05/12/2022   Nonproliferative diabetic retinopathy ([place]) 05/12/2022   Presence of prosthetic heart valve 05/12/2022   Proteinuria 05/12/2022   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[place]) 95/62/1308   Closed fracture of nasal bones 05/14/2020   Gastrointestinal hemorrhage 11/29/2019   Gastric ulcer due to Helicobacter pylori 65/78/4696   History of GI bleed 11/04/2019   Symptomatic anemia 11/03/2019   Chronic diastolic CHF (congestive heart failure) ([place]) 10/02/2018   AVM (arteriovenous malformation) of small bowel, acquired    Acute kidney injury (nontraumatic) (HCC)    HTN (hypertension) 04/15/2015   Hyperlipidemia 04/15/2015   Stage 4 chronic kidney disease ([place]) 04/15/2015   PAF (paroxysmal atrial  fibrillation) ([place]) 04/15/2015   Carotid stenosis 11/28/2013   Alteration in anticoagulation 11/01/2013   Normocytic anemia 02/11/2013   Chronic anticoagulation 02/11/2013   H/O mechanical aortic valve replacement 02/11/2013   Coronary Artery Disease 02/11/2013   Type II diabetes mellitus with renal manifestations ([place]) 02/11/2013    Orientation RESPIRATION BLADDER Height & Weight     Self, Time, Situation, Place  Normal Continent Weight:   Height:     BEHAVIORAL SYMPTOMS/MOOD NEUROLOGICAL BOWEL NUTRITION STATUS      Continent Diet  AMBULATORY STATUS COMMUNICATION OF NEEDS Skin   Limited Assist Verbally Normal                       Personal Care Assistance Level of Assistance  Bathing, Dressing, Feeding Bathing Assistance: Independent Feeding assistance: Limited assistance Dressing Assistance: Limited assistance     Functional Limitations Info  Sight, Hearing, Speech Sight Info: Adequate Hearing Info: Adequate Speech Info: Adequate    SPECIAL CARE FACTORS FREQUENCY                       Contractures Contractures Info: Not present    Additional Factors Info  Code Status, Allergies Code Status Info: Prior Allergies Info: Darvon (propoxyphene) High Allergy Nausea And Vomiting  Lisinopril High Allergy Cough  Septra (sulfamethoxazole-trimethoprim) High Contraindication Other (See Comments) Increased INR Warfarin And Related High Intolerance Other (See Comments) ONLY TOLERATES BRAND Trazodone And Nefazodone Not Specified  Other (See Comments) Severe sweating Zolpidem Tartrate Not Specified  Other (See Comments) Hallucinations Penicillins Low Allergy Rash           Current Medications (10/10/2022):  This is the current hospital active medication list Current Facility-Administered Medications  Medication Dose Route Frequency Provider Last Rate Last Admin   amiodarone (PACERONE) tablet 200 mg  200 mg Oral Daily Kommor, Madison, MD       atorvastatin (LIPITOR)  tablet 80 mg  80 mg Oral Daily Kommor, Madison, MD       cephALEXin (KEFLEX) capsule 500 mg  500 mg Oral Q8H Kommor, Madison, MD   500 mg at 10/10/22 1321   ezetimibe (ZETIA) tablet 10 mg  10 mg Oral Daily Kommor, Madison, MD       furosemide (LASIX) tablet 40 mg  40 mg Oral Daily Kommor, Madison, MD   40 mg at 10/10/22 1322   isosorbide mononitrate (IMDUR) 24 hr tablet 60 mg  60 mg Oral Daily Kommor, Madison, MD       LORazepam (ATIVAN) tablet 0.5 mg  0.5 mg Oral BID PRN Kommor, Madison, MD   0.5 mg at 10/10/22 1353   losartan (COZAAR) tablet 50 mg  50 mg Oral Daily Kommor, Madison, MD       warfarin (COUMADIN) tablet 1.25-2.5 mg  1.25-2.5 mg Oral See admin instructions Kommor, Madison, MD       Current Outpatient Medications  Medication Sig Dispense Refill   amiodarone (PACERONE) 200 MG tablet Take 1 tablet by mouth once daily (Patient taking differently: Take 200 mg by mouth daily.) 90 tablet 2   atorvastatin (LIPITOR) 80 MG tablet Take 1 tablet by mouth once daily (Patient taking differently: Take 80 mg by mouth daily.) 90 tablet 1   Bevacizumab (AVASTIN IV) Inject 1 Dose into the vein every 8 (eight) weeks.     cephALEXin (KEFLEX) 500 MG capsule Take 1 capsule (500 mg total) by mouth every 8 (eight) hours for 5 days. 15 capsule 0   ezetimibe (ZETIA) 10 MG tablet Take 1 tablet by mouth once daily (Patient taking differently: Take 10 mg by mouth daily.) 90 tablet 2   FeFum-FePo-FA-B Cmp-C-Zn-Mn-Cu (SE-TAN PLUS) 162-115.2-1 MG CAPS Take 1 capsule by mouth 2 (two) times daily.   3   folic acid (FOLVITE) 323 MCG tablet Take 400 mcg by mouth every evening.      furosemide (LASIX) 40 MG tablet Take 1 tablet (40 mg total) by mouth 2 (two) times daily as needed. (Patient taking differently: Take 40 mg by mouth daily.) 180 tablet 3   isosorbide mononitrate (IMDUR) 60 MG 24 hr tablet Take 1 tablet by mouth once daily (Patient taking differently: Take 60 mg by mouth daily.) 90 tablet 2   losartan  (COZAAR) 50 MG tablet Take 50 mg by mouth daily.     Multiple Vitamin (MULTIVITAMIN WITH MINERALS) TABS Take 1 tablet by mouth daily.     Multiple Vitamins-Minerals (PRESERVISION AREDS 2 PO) Take 1 tablet by mouth 2 (two) times daily.      nitroGLYCERIN (NITROSTAT) 0.4 MG SL tablet DISSOLVE ONE TABLET UNDER THE TONGUE EVERY 5 MINUTES AS NEEDED FOR CHEST PAIN.  DO NOT EXCEED A TOTAL OF 3 DOSES IN 15 MINUTES (Patient taking differently: Place 0.4 mg under the tongue every 5 (five) minutes as needed for chest pain.) 25 tablet 5   Omega-3 Fatty Acids (FISH OIL) 600 MG CAPS Take 1,200 mg by mouth daily.     warfarin (JANTOVEN) 2.5 MG tablet TAKE 1/2 TABLET TO 1 TABLET BY MOUTH DAILY AS DIRECTED BY COUMADIN CLINIC (Patient taking differently: Take 1.25-2.5 mg by mouth See admin instructions. Take 1.25 mg (1/2  tablet) by mouth every day except on Tuesday's and Thursday's. On Tuesday's and Thursday's, take 2.5 mg (1 tablet).) 65 tablet 1     Discharge Medications: Please see discharge summary for a list of discharge medications.  Relevant Imaging Results:  Relevant Lab Results:   Additional Information [place] Felicita Nuncio LCSW-A  [place], [place]

## 2022-10-10 NOTE — ED Provider Notes (Signed)
[place] AT [facility] Provider Note  CSN: [id number] Arrival date & time: 10/10/22 1130  Chief Complaint(s) Placement  HPI [name] is a 89 y.o. female with PMH CAD status post CABG, aortic valve disease status post AVR, paroxysmal A-fib on Coumadin, HTN, HLD, T2DM, GI bleed, CKD 3, CHF, recent hospital discharge yesterday after an admission for cellulitis who represents emergency department with request for rehab facility placement.  While inpatient, the inpatient team was requesting that the patient be discharged to a rehab facility given her lack of mobility, but the patient wanted to go home.  While at home, the patient was unable to complete her ADLs, urinating on himself frequently and family is unable to care for her.  Here in the emergency department, patient is hemodynamically stable and cellulitis does look improved.  No additional complaints here in the ER today.   Past Medical History Past Medical History:  Diagnosis Date   Anemia    Anginal pain ([place])    Arteriovenous malformation of gastrointestinal tract    Arthritis    "fingers mostly" (04/12/2015)   Carotid artery disease ([place])    Carotid US 3/17:  67-89% RICA; 38-10% LICA; Elevated bilateral subclavian artery velocities >>f/u 1 year. // Carotid US 4/18: R 40-59; L 1-39 >> FU 1 year // Carotid US 10/2018: R 40-59; L 1-39, L subclavian stenosis    CHF (congestive heart failure) (HCC)    Chronic kidney disease (CKD), stage III (moderate) (HCC)    Chronic lower GI bleeding    "today; last time was ~ 8 yr ago; used to have them often before that too" (02/11/2013)   Coronary artery disease    DJD (degenerative joint disease)    Dysrhythmia    Heart murmur    History of blood transfusion    "a few times over the years; usually related to my Coumadin" (04/12/2015   History of gout    Hyperlipemia    Hypertension    Macula lutea degeneration    Old MI (myocardial infarction)    "a  coulple /dr in 02/2008; I never even knew I'd had them" (04/12/2015)   Type II diabetes mellitus ([place])    no medications, diet controlled   Patient Active Problem List   Diagnosis Date Noted   Cellulitis 10/05/2022   Lumbar herniated disc 06/02/2022   Arteriovenous malformation of digestive system vessel (CODE) 05/12/2022   Congestive heart failure ([place]) 05/12/2022   Diabetic renal disease ([place]) 05/12/2022   Exudative age-related macular degeneration ([place]) 05/12/2022   Gout 05/12/2022   Hypertensive heart and chronic kidney disease with heart failure and stage 1 through stage 4 chronic kidney disease, or unspecified chronic kidney disease ([place]) 05/12/2022   Nonproliferative diabetic retinopathy ([place]) 05/12/2022   Presence of prosthetic heart valve 05/12/2022   Proteinuria 05/12/2022   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HCC) 17/51/0258   Closed fracture of nasal bones 05/14/2020   Gastrointestinal hemorrhage 11/29/2019   Gastric ulcer due to Helicobacter pylori 52/77/8242   History of GI bleed 11/04/2019   Symptomatic anemia 11/03/2019   Chronic diastolic CHF (congestive heart failure) ([place]) 10/02/2018   AVM (arteriovenous malformation) of small bowel, acquired    Acute kidney injury (nontraumatic) (HCC)    HTN (hypertension) 04/15/2015   Hyperlipidemia 04/15/2015   Stage 4 chronic kidney disease ([place]) 04/15/2015   PAF (paroxysmal atrial fibrillation) ([place]) 04/15/2015   Carotid stenosis 11/28/2013   Alteration in anticoagulation 11/01/2013  Normocytic anemia 02/11/2013   Chronic anticoagulation 02/11/2013   H/O mechanical aortic valve replacement 02/11/2013   Coronary Artery Disease 02/11/2013   Type II diabetes mellitus with renal manifestations ([place]) 02/11/2013   Home Medication(s) Prior to Admission medications   Medication Sig Start Date End Date Taking? Authorizing Provider  amiodarone (PACERONE) 200 MG tablet Take 1 tablet by mouth once  daily Patient taking differently: Take 200 mg by mouth in the morning. 03/10/22  Yes Jettie Booze, MD  atorvastatin (LIPITOR) 80 MG tablet Take 1 tablet by mouth once daily Patient taking differently: Take 80 mg by mouth in the morning. 05/12/22  Yes Jettie Booze, MD  Bevacizumab (AVASTIN IV) Inject 1 Dose into the vein every 8 (eight) weeks.   Yes [provider]  cephALEXin (KEFLEX) 500 MG capsule Take 1 capsule (500 mg total) by mouth every 8 (eight) hours for 5 days. 10/09/22 10/14/22 Yes [place] Eric J, DO  clindamycin (CLEOCIN) 300 MG capsule Take 300 mg by mouth See admin instructions. Take as directed to pre-treat for dental appointments   Yes [provider]  ezetimibe (ZETIA) 10 MG tablet Take 1 tablet by mouth once daily Patient taking differently: Take 10 mg by mouth in the morning. 02/03/22  Yes Jettie Booze, MD  FeFum-FePo-FA-B Cmp-C-Zn-Mn-Cu (SE-TAN PLUS) 162-115.2-1 MG CAPS Take 1 capsule by mouth 2 (two) times daily. 05/22/15  Yes [provider]  folic acid (FOLVITE) 366 MCG tablet Take 400 mcg by mouth in the morning.   Yes [provider]  furosemide (LASIX) 40 MG tablet Take 1 tablet (40 mg total) by mouth 2 (two) times daily as needed. Patient taking differently: Take 40-80 mg by mouth daily as needed for fluid or edema. 07/10/22  Yes Jettie Booze, MD  isosorbide mononitrate (IMDUR) 60 MG 24 hr tablet Take 1 tablet by mouth once daily Patient taking differently: Take 60 mg by mouth in the morning. 05/07/22  Yes Jettie Booze, MD  losartan (COZAAR) 50 MG tablet Take 50 mg by mouth in the morning.   Yes [provider]  Multiple Vitamin (MULTIVITAMIN WITH MINERALS) TABS Take 1 tablet by mouth daily with breakfast.   Yes [provider]  Multiple Vitamins-Minerals (PRESERVISION AREDS 2 PO) Take 1 capsule by mouth 2 (two) times daily.   Yes [provider]  nitroGLYCERIN (NITROSTAT) 0.4  MG SL tablet DISSOLVE ONE TABLET UNDER THE TONGUE EVERY 5 MINUTES AS NEEDED FOR CHEST PAIN.  DO NOT EXCEED A TOTAL OF 3 DOSES IN 15 MINUTES Patient taking differently: Place 0.4 mg under the tongue every 5 (five) minutes x 3 doses as needed for chest pain. 02/14/20  Yes Jettie Booze, MD  Omega-3 Fatty Acids (FISH OIL) 600 MG CAPS Take 1,200 mg by mouth 2 (two) times daily.   Yes [provider]  oxyCODONE (OXY IR/ROXICODONE) 5 MG immediate release tablet Take 5 mg by mouth every 6 (six) hours as needed for moderate pain or severe pain.   Yes [provider]  TYLENOL 500 MG tablet Take 500-1,000 mg by mouth every 6 (six) hours as needed for mild pain or headache.   Yes [provider]  warfarin (JANTOVEN) 2.5 MG tablet TAKE 1/2 TABLET TO 1 TABLET BY MOUTH DAILY AS DIRECTED BY COUMADIN CLINIC Patient taking differently: Take 1.25-2.5 mg by mouth See admin instructions. Take 1.25 mg by mouth in the evening on Sun/Mon/Wed/Fri/Sat and 2.5 mg on Tues/Thurs 10/03/22  Yes [place],  Charlann Lange, MD                                                                                                                                    Past Surgical History Past Surgical History:  Procedure Laterality Date   AORTIC VALVE REPLACEMENT  09/23/1991   St. Jude   APPENDECTOMY  09/23/1951   BIOPSY  11/06/2019   Procedure: BIOPSY;  Surgeon: Doran Stabler, MD;  Location: [facility] ENDOSCOPY;  Service: Gastroenterology;;   CARDIAC CATHETERIZATION  ~1990   CARDIAC CATHETERIZATION  04/12/2015   Procedure: Coronary Stent Intervention;  Surgeon: Jettie Booze, MD;    SYNERGY DES 3.5X16 to the ostial RCA    CARDIAC CATHETERIZATION  04/12/2015   Procedure: Coronary/Graft Angiography;  Surgeon: Eloy End, MD; LAD & CFX 100%, patent LIMA-LAD, SVG-D1; SVG-OM-PDA first limb 100%, 2nd limb patent; oRCA 75%>0 w/ stent   CARDIAC VALVE REPLACEMENT  09/22/1992   St. Jude/notes 10/29/2003  (02/11/2013)   CARDIOVERSION N/A 08/15/2019   Procedure: CARDIOVERSION;  Surgeon: Thayer Headings, MD;  Location: [facility] END[facility]COPY;  Service: Cardiovascular;  Laterality: N/A;   COLONOSCOPY N/A 02/13/2013   Procedure: COLONOSCOPY;  Surgeon: Lafayette Dragon, MD;  Location: [facility] ENDOSCOPY;  Service: Endoscopy;  Laterality: N/A;   CORONARY ANGIOPLASTY WITH STENT PLACEMENT  2009+   "3 at least; put in 1 stent at a time" (02/11/2013)   CORONARY ARTERY BYPASS GRAFT  09/22/1988   LIMA-LAD, SVG-OM-PDA, SVG-D1   DILATION AND CURETTAGE OF UTERUS  05/24/1959   'after a miscarriage" (02/11/2013)   ENTEROSCOPY N/A 02/13/2013   Procedure: ENTEROSCOPY;  Surgeon: Lafayette Dragon, MD;  Location: [facility] ENDOSCOPY;  Service: Endoscopy;  Laterality: N/A;   ENTEROSCOPY N/A 11/06/2019   Procedure: ENTEROSCOPY;  Surgeon: Doran Stabler, MD;  Location: [place];  Service: Gastroenterology;  Laterality: N/A;   ESOPHAGOGASTRODUODENOSCOPY (EGD) WITH PROPOFOL N/A 12/01/2019   Procedure: ESOPHAGOGASTRODUODENOSCOPY (EGD) WITH PROPOFOL;  Surgeon: Mauri Pole, MD;  Location: [place] ENDOSCOPY;  Service: Endoscopy;  Laterality: N/A;   EXPLORATORY LAPAROTOMY  02/24/2008   which revealed a retroperitoneal hematoma and bleeding from the right external iliac artery/notes 03/02/2008 (02/11/2013)    FLEXIBLE SIGMOIDOSCOPY N/A 04/21/2015   Procedure: FLEXIBLE SIGMOIDOSCOPY;  Surgeon: Ladene Artist, MD;  Location: [facility] ENDOSCOPY;  Service: Endoscopy;  Laterality: N/A;   HOT HEMOSTASIS N/A 11/06/2019   Procedure: HOT HEMOSTASIS (ARGON PLASMA COAGULATION/BICAP);  Surgeon: Doran Stabler, MD;  Location: [place];  Service: Gastroenterology;  Laterality: N/A;   LUMBAR LAMINECTOMY/DECOMPRESSION MICRODISCECTOMY Left 06/02/2022   Procedure: LEFT LUMBAR FOUR-FIVE MICRODISCECTOMY;  Surgeon: Newman Pies, MD;  Location: [place];  Service: Neurosurgery;  Laterality: Left;  3C   TEE WITHOUT CARDIOVERSION N/A 12/01/2018   Procedure:  TRANSESOPHAGEAL ECHOCARDIOGRAM (TEE);  Surgeon: Jerline Pain, MD;  Location[facility] ENDOSCOPY;  Service: Cardiovascular;  Laterality: N/A;   Family History Family History  Problem  Relation Age of Onset   Heart disease Mother    Heart disease Father     Social History Social History   Tobacco Use   Smoking status: Never   Smokeless tobacco: Never  Vaping Use   Vaping Use: Never used  Substance Use Topics   Alcohol use: No   Drug use: No   Allergies Capzasin [capsaicin], Darvon [propoxyphene], Lisinopril, Septra [sulfamethoxazole-trimethoprim], Voltaren [diclofenac], Warfarin and related, Tobrex [tobramycin], Trazodone and nefazodone, Zolpidem tartrate, and Penicillins  Review of Systems Review of Systems  Constitutional:  Positive for fatigue.    Physical Exam Vital Signs  I have reviewed the triage vital signs BP 118/63 (BP Location: Left Arm)   Pulse 72   Temp 97.8 F (36.6 C) (Oral)   Resp 16   SpO2 100%   Physical Exam Vitals and nursing note reviewed.  Constitutional:      General: She is not in acute distress.    Appearance: She is well-developed.  HENT:     Head: Normocephalic and atraumatic.  Eyes:     Conjunctiva/sclera: Conjunctivae normal.  Cardiovascular:     Rate and Rhythm: Normal rate and regular rhythm.     Heart sounds: No murmur heard. Pulmonary:     Effort: Pulmonary effort is normal. No respiratory distress.     Breath sounds: Normal breath sounds.  Abdominal:     Palpations: Abdomen is soft.     Tenderness: There is no abdominal tenderness.  Musculoskeletal:        General: No swelling.     Cervical back: Neck supple.  Skin:    General: Skin is warm and dry.     Capillary Refill: Capillary refill takes less than 2 seconds.  Neurological:     Mental Status: She is alert.  Psychiatric:        Mood and Affect: Mood normal.     ED Results and Treatments Labs (all labs ordered are listed, but only abnormal results are  displayed) Labs Reviewed  CBC WITH DIFFERENTIAL/PLATELET - Abnormal; Notable for the following components:      Result Value   RBC 3.17 (*)    Hemoglobin 9.7 (*)    HCT 31.6 (*)    RDW 17.7 (*)    All other components within normal limits  COMPREHENSIVE METABOLIC PANEL - Abnormal; Notable for the following components:   Sodium 134 (*)    BUN 32 (*)    Creatinine, Ser 1.52 (*)    Calcium 8.3 (*)    Total Protein 6.2 (*)    Albumin 3.0 (*)    AST 93 (*)    GFR, Estimated 33 (*)    All other components within normal limits  PROTIME-INR - Abnormal; Notable for the following components:   Prothrombin Time 24.1 (*)    INR 2.2 (*)    All other components within normal limits  Radiology No results found.  Pertinent labs & imaging results that were available during my care of the patient were reviewed by me and considered in my medical decision making (see MDM for details).  Medications Ordered in ED Medications  amiodarone (PACERONE) tablet 200 mg (has no administration in time range)  atorvastatin (LIPITOR) tablet 80 mg (80 mg Oral Not Given 10/10/22 1322)  cephALEXin (KEFLEX) capsule 500 mg (500 mg Oral Given 10/10/22 1321)  ezetimibe (ZETIA) tablet 10 mg (has no administration in time range)  furosemide (LASIX) tablet 40 mg (40 mg Oral Given 10/10/22 1322)  isosorbide mononitrate (IMDUR) 24 hr tablet 60 mg (has no administration in time range)  losartan (COZAAR) tablet 50 mg (50 mg Oral Not Given 10/10/22 1321)  LORazepam (ATIVAN) tablet 0.5 mg (0.5 mg Oral Given 10/10/22 1353)  warfarin (COUMADIN) tablet 2.5 mg (has no administration in time range)  warfarin (COUMADIN) tablet 1.25 mg (has no administration in time range)  Warfarin - Physician Dosing Inpatient (has no administration in time range)                                                                                                                                      Procedures Procedures  (including critical care time)  Medical Decision Making / ED Course   This patient presents to the ED for concern of generalized weakness, this involves an extensive number of treatment options, and is a complaint that carries with it a high risk of complications and morbidity.  The differential diagnosis includes deconditioning, anemia, electrolyte abnormality  MDM: Patient seen emergency room for evaluation of request for placement.  Physical exam largely unremarkable.  Laboratory evaluation appears at baseline with hemoglobin 9.7, creatinine 1.52 with a BUN of 32, albumin 3.0, INR 2.2.  TOC consulted and they evaluated the patient, currently working on placement.  Patient will remain in the emergency department until placement can be found.  Please see provider signout notes for continuation of workup.  Home meds restarted.   Additional history obtained: -Additional history obtained from husband -External records from outside source obtained and reviewed including: Chart review including previous notes, labs, imaging, consultation notes   Lab Tests: -I ordered, reviewed, and interpreted labs.   The pertinent results include:   Labs Reviewed  CBC WITH DIFFERENTIAL/PLATELET - Abnormal; Notable for the following components:      Result Value   RBC 3.17 (*)    Hemoglobin 9.7 (*)    HCT 31.6 (*)    RDW 17.7 (*)    All other components within normal limits  COMPREHENSIVE METABOLIC PANEL - Abnormal; Notable for the following components:   Sodium 134 (*)    BUN 32 (*)    Creatinine, Ser 1.52 (*)    Calcium 8.3 (*)    Total Protein 6.2 (*)    Albumin 3.0 (*)    AST 93 (*)  GFR, Estimated 33 (*)    All other components within normal limits  PROTIME-INR - Abnormal; Notable for the following components:   Prothrombin Time 24.1 (*)    INR 2.2 (*)    All other components within normal limits        Medicines ordered and prescription drug management: Meds ordered this encounter  Medications   amiodarone (PACERONE) tablet 200 mg   atorvastatin (LIPITOR) tablet 80 mg   cephALEXin (KEFLEX) capsule 500 mg   ezetimibe (ZETIA) tablet 10 mg   furosemide (LASIX) tablet 40 mg   isosorbide mononitrate (IMDUR) 24 hr tablet 60 mg   losartan (COZAAR) tablet 50 mg   DISCONTD: warfarin (COUMADIN) tablet 1.25-2.5 mg   LORazepam (ATIVAN) tablet 0.5 mg   warfarin (COUMADIN) tablet 2.5 mg   warfarin (COUMADIN) tablet 1.25 mg   Warfarin - Physician Dosing Inpatient    -I have reviewed the patients home medicines and have made adjustments as needed  Critical interventions none  Consultations Obtained: I requested consultation with the [facility] team,  and discussed lab and imaging findings as well as pertinent plan - they recommend: Placement   Cardiac Monitoring: The patient was maintained on a cardiac monitor.  I personally viewed and interpreted the cardiac monitored which showed an underlying rhythm of: NSR  Social Determinants of Health:  Factors impacting patients care include: Able to complete ADLs at home   Reevaluation: After the interventions noted above, I reevaluated the patient and found that they have :stayed the same  Co morbidities that complicate the patient evaluation  Past Medical History:  Diagnosis Date   Anemia    Anginal pain (HCC)    Arteriovenous malformation of gastrointestinal tract    Arthritis    "fingers mostly" (04/12/2015)   Carotid artery disease ([place])    Carotid US 3/17:  35-70% RICA; 17-79% LICA; Elevated bilateral subclavian artery velocities >>f/u 1 year. // Carotid US 4/18: R 40-59; L 1-39 >> FU 1 year // Carotid US 10/2018: R 40-59; L 1-39, L subclavian stenosis    CHF (congestive heart failure) (HCC)    Chronic kidney disease (CKD), stage III (moderate) (HCC)    Chronic lower GI bleeding    "today; last time was ~ 8 yr ago; used to have them  often before that too" (02/11/2013)   Coronary artery disease    DJD (degenerative joint disease)    Dysrhythmia    Heart murmur    History of blood transfusion    "a few times over the years; usually related to my Coumadin" (04/12/2015   History of gout    Hyperlipemia    Hypertension    Macula lutea degeneration    Old MI (myocardial infarction)    "a coulple /dr in 02/2008; I never even knew I'd had them" (04/12/2015)   Type II diabetes mellitus ([place])    no medications, diet controlled      Dispostion: I considered admission for this patient, and patient will likely be discharged to SNF when this becomes available     Final Clinical Impression(s) / ED Diagnoses Final diagnoses:  None     '@PCDICTATION'$ @    Teressa Lower, MD 10/10/22 1641

## 2022-10-10 NOTE — Progress Notes (Signed)
Transition of Care [facility]) - Emergency Department Mini Assessment   Patient Details  Name: [name] MRN: [id number] Date of Birth: March 04, 1933  [facility] CM/SW Contact:    Rodney Booze, LCSW Phone Number: 10/10/2022, 4:14 PM   Clinical Narrative: CSW met the husband and patient at the bedside. The husband would like [facility] [place] due to it being close to their home. CSW also explained the process to the family. CSW has reached out to [facility] to ask if they could review the family. TOC will continue to follow.    ED Mini Assessment: What brought you to the Emergency Department? : (P) Patient fell had a recent decline.  Barriers to Discharge: (P) No Barriers Identified        Interventions which prevented an admission or readmission: (P) SNF Placement    Patient Contact and Communications     Spoke with: (P) Husband at bedside Contact Date: (P) 10/10/22,   Contact time: (P) 1613      Patient states their goals for this hospitalization and ongoing recovery are:: (P) Pateint and husband wants wife to get better at a SNF      Admission diagnosis:  Facility Placement Patient Active Problem List   Diagnosis Date Noted   Cellulitis 10/05/2022   Lumbar herniated disc 06/02/2022   Arteriovenous malformation of digestive system vessel (CODE) 05/12/2022   Congestive heart failure ([place]) 05/12/2022   Diabetic renal disease ([place]) 05/12/2022   Exudative age-related macular degeneration ([place]) 05/12/2022   Gout 05/12/2022   Hypertensive heart and chronic kidney disease with heart failure and stage 1 through stage 4 chronic kidney disease, or unspecified chronic kidney disease ([place]) 05/12/2022   Nonproliferative diabetic retinopathy ([place]) 05/12/2022   Presence of prosthetic heart valve 05/12/2022   Proteinuria 05/12/2022   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[place]) 91/91/6606   Closed fracture of nasal bones  05/14/2020   Gastrointestinal hemorrhage 11/29/2019   Gastric ulcer due to Helicobacter pylori 00/45/9977   History of GI bleed 11/04/2019   Symptomatic anemia 11/03/2019   Chronic diastolic CHF (congestive heart failure) ([place]) 10/02/2018   AVM (arteriovenous malformation) of small bowel, acquired    Acute kidney injury (nontraumatic) (HCC)    HTN (hypertension) 04/15/2015   Hyperlipidemia 04/15/2015   Stage 4 chronic kidney disease ([place]) 04/15/2015   PAF (paroxysmal atrial fibrillation) ([place]) 04/15/2015   Carotid stenosis 11/28/2013   Alteration in anticoagulation 11/01/2013   Normocytic anemia 02/11/2013   Chronic anticoagulation 02/11/2013   H/O mechanical aortic valve replacement 02/11/2013   Coronary Artery Disease 02/11/2013   Type II diabetes mellitus with renal manifestations ([place]) 02/11/2013   PCP:  Gaynelle Arabian, MD Pharmacy:   [facility] [address], [place] [address] Phone: [phone] Fax: [phone]

## 2022-10-11 DIAGNOSIS — R350 Frequency of micturition: Secondary | ICD-10-CM | POA: Diagnosis not present

## 2022-10-11 DIAGNOSIS — R54 Age-related physical debility: Secondary | ICD-10-CM | POA: Diagnosis not present

## 2022-10-11 LAB — PROTIME-INR
INR: 2.3 — ABNORMAL HIGH (ref 0.8–1.2)
Prothrombin Time: 24.7 seconds — ABNORMAL HIGH (ref 11.4–15.2)

## 2022-10-11 NOTE — Progress Notes (Addendum)
Patient and family has chosen [facility]. Per CSW on 1/19, [facility] will accept the pt once she has completed a 10 day isolation period from the date of positive covid test (1/14). TOC anticipates pt being able to d/c on Wednesday, 10/15/22.

## 2022-10-11 NOTE — ED Notes (Signed)
Purewick was not properly suctioning. Changed out pt and corrected suction canister

## 2022-10-11 NOTE — ED Provider Notes (Signed)
Emergency Medicine Observation Re-evaluation Note  [name] is a 89 y.o. female, seen on rounds today.  Pt initially presented to the ED for complaints of Placement Currently, the patient is in hallway no acute distress.  Physical Exam  BP 136/66 (BP Location: Left Arm)   Pulse 77   Temp 98.2 F (36.8 C)   Resp 14   SpO2 100%  Physical Exam   ED Course / MDM  EKG:   I have reviewed the labs performed to date as well as medications administered while in observation.  Recent changes in the last 24 hours include nothing.  Plan  Current plan is for placement.    [name], MD 10/11/22 [phone]

## 2022-10-11 NOTE — Evaluation (Signed)
Physical Therapy Evaluation Patient Details Name: [name] MRN: [id number] DOB: 11/28/1932 Today's Date: 10/11/2022  History of Present Illness  Pt is an 89yo female presenting to [facility] ED on 10/10/22 for rehab facility pt as pt unable to complete ADLs and spouse unable to assist. Of Note recent [facility] hospital stay 1/14-18/24 for RLE cellulitis. PMH: anemia, angina, CAD s/p stent s/p cath s/p CABG, CHF, CKD3, gout, HLD, HTN, hx of Mi, DM, lumbar laminectomy 2023, afib.   Clinical Impression  Pt presents with the problems above and functional impairments below. Pt had two falls on day of discharge from recent admission at [facility]. Pt received supine in bed with husband at bedside. Required mod assist +2 for all mobility today; ambulation not attempted as pt RLE buckled within 15s of standing that required PT assist to correct, able to take side-steps EOB with +2 assistance for safety. Pt would benefit from Snf-level therapies upon discharge, pt and husband are agreeable and would prefer [facility]. We will continue to follow acutely.      Recommendations for follow up therapy are one component of a multi-disciplinary discharge planning process, led by the attending physician.  Recommendations may be updated based on patient status, additional functional criteria and insurance authorization.  Follow Up Recommendations Skilled nursing-short term rehab (<3 hours/day) (Unsafe to discharge home) Can patient physically be transported by private vehicle: No    Assistance Recommended at Discharge Frequent or constant Supervision/Assistance  Patient can return home with the following  A lot of help with walking and/or transfers;A lot of help with bathing/dressing/bathroom;Assistance with cooking/housework;Assist for transportation;Help with stairs or ramp for entrance    Equipment Recommendations Other (comment) (recommend getting info re: getting a ramp for pt and husband)  Recommendations for Other Services        Functional Status Assessment Patient has had a recent decline in their functional status and demonstrates the ability to make significant improvements in function in a reasonable and predictable amount of time.     Precautions / Restrictions Precautions Precautions: Fall Precaution Comments: Pt had two falls this week Restrictions Weight Bearing Restrictions: No      Mobility  Bed Mobility Overal bed mobility: Needs Assistance Bed Mobility: Supine to Sit, Sit to Supine     Supine to sit: Mod assist, +2 for safety/equipment, +2 for physical assistance Sit to supine: Mod assist, +2 for physical assistance, +2 for safety/equipment   General bed mobility comments: Mod assist for trunk elevation/declination and bringing BLE off and back on bed. Pt with posterior lean once sitting upright EOB requiring max assist to correct but then able to sit with min guard.    Transfers Overall transfer level: Needs assistance Equipment used: Rolling walker (2 wheels) Transfers: Sit to/from Stand Sit to Stand: Mod assist, +2 physical assistance, +2 safety/equipment, From elevated surface           General transfer comment: Pt required mod assist +2 for sit to stand transfer from stretcher in ED, pt with anterior trunk lean and hips posterior, required cuing to adjust. After ~15s standing, pt R knee buckled that required PT blocking to correct. Pt able to take 3 sidesteps EOB to adjust supine positioning, further mobility defer4red.    Ambulation/Gait               General Gait Details: Unable at present  Stairs            Wheelchair Mobility    Modified Rankin (Stroke Patients Only)  Balance Overall balance assessment: Needs assistance Sitting-balance support: Feet supported Sitting balance-Leahy Scale: Good   Postural control: Right lateral lean, Posterior lean Standing balance support: Reliant on assistive device for balance, During functional activity,  Bilateral upper extremity supported Standing balance-Leahy Scale: Poor                               Pertinent Vitals/Pain Pain Assessment Pain Assessment: Faces Faces Pain Scale: Hurts little more Breathing: normal Negative Vocalization: none Facial Expression: smiling or inexpressive Body Language: relaxed Consolability: no need to console PAINAD Score: 0 Pain Location: R lower leg Pain Descriptors / Indicators: Tender Pain Intervention(s): Monitored during session, Limited activity within patient's tolerance, Repositioned    Home Living Family/patient expects to be discharged to:: Private residence Living Arrangements: Spouse/significant other Available Help at Discharge: Family;Available 24 hours/day Type of Home: House Home Access: Stairs to enter Entrance Stairs-Rails: Right Entrance Stairs-Number of Steps: 3   Home Layout: One level Home Equipment: Rolling Walker (2 wheels);Grab bars - tub/shower;Shower seat;Cane - single point;Wheelchair - manual;Hand held shower head;Adaptive equipment      Prior Function Prior Level of Function : Needs assist             Mobility Comments: Has been sleeping in recliner; Difficulty getting to stand; very limited walking, unable to even transfer from recliner to [facility] without falling ADLs Comments: sponge bath     Hand Dominance   Dominant Hand: Right    Extremity/Trunk Assessment   Upper Extremity Assessment Upper Extremity Assessment: Generalized weakness RUE Deficits / Details: Grip strength poor, MMT of shoulderGrossly 3+/5 RUE Sensation: WNL LUE Deficits / Details: Grip strength poor, MMT of shoulderGrossly 3+/5 LUE Sensation: WNL    Lower Extremity Assessment Lower Extremity Assessment: Generalized weakness;RLE deficits/detail;LLE deficits/detail RLE Deficits / Details: erythema and painful to palp lower leg, pt reporting "when you put on my sock it hurt." RLE: Unable to fully assess due to pain RLE  Sensation: decreased light touch LLE Deficits / Details: Reduced ROM LLE Sensation: WNL    Cervical / Trunk Assessment Cervical / Trunk Assessment: Kyphotic  Communication   Communication: HOH  Cognition Arousal/Alertness: Awake/alert Behavior During Therapy: WFL for tasks assessed/performed Overall Cognitive Status: History of cognitive impairments - at baseline                                 General Comments: ST memory deficits noted        General Comments General comments (skin integrity, edema, etc.): Husband present    Exercises     Assessment/Plan    PT Assessment Patient needs continued PT services  PT Problem List Decreased strength;Decreased range of motion;Decreased activity tolerance;Decreased balance;Decreased mobility;Decreased coordination;Decreased cognition;Decreased knowledge of use of DME;Decreased safety awareness;Decreased knowledge of precautions;Pain;Decreased skin integrity       PT Treatment Interventions DME instruction;Gait training;Stair training;Functional mobility training;Therapeutic activities;Therapeutic exercise;Balance training;Cognitive remediation;Patient/family education    PT Goals (Current goals can be found in the Care Plan section)  Acute Rehab PT Goals Patient Stated Goal: leg to feel better PT Goal Formulation: With patient/family Time For Goal Achievement: 10/19/22 Potential to Achieve Goals: Good    Frequency Min 2X/week     Co-evaluation               AM-PAC PT "6 Clicks" Mobility  Outcome Measure Help needed turning from your  back to your side while in a flat bed without using bedrails?: A Lot Help needed moving from lying on your back to sitting on the side of a flat bed without using bedrails?: A Lot Help needed moving to and from a bed to a chair (including a wheelchair)?: Total Help needed standing up from a chair using your arms (e.g., wheelchair or bedside chair)?: Total Help needed to walk  in hospital room?: Total Help needed climbing 3-5 steps with a railing? : Total 6 Click Score: 8    End of Session Equipment Utilized During Treatment: Gait belt Activity Tolerance: Patient limited by fatigue Patient left: in bed;with family/visitor present (on stretcher in ED hallway) Nurse Communication: Mobility status PT Visit Diagnosis: Unsteadiness on feet (R26.81);Other abnormalities of gait and mobility (R26.89);Pain;History of falling (Z91.81);Muscle weakness (generalized) (M62.81) Pain - Right/Left: Right Pain - part of body: Leg    Time: 1320-1330 PT Time Calculation (min) (ACUTE ONLY): 10 min   Charges:   PT Evaluation $PT Eval Low Complexity: 1 Low          Coolidge Breeze, PT, DPT [facility] Rehabilitation Department Office: [phone] Weekend pager: [phone]  Coolidge Breeze 10/11/2022, 3:12 PM

## 2022-10-11 NOTE — ED Notes (Signed)
Incontinent care provided.

## 2022-10-12 DIAGNOSIS — R54 Age-related physical debility: Secondary | ICD-10-CM | POA: Diagnosis not present

## 2022-10-12 DIAGNOSIS — R350 Frequency of micturition: Secondary | ICD-10-CM | POA: Diagnosis not present

## 2022-10-12 LAB — CBC WITH DIFFERENTIAL/PLATELET
Abs Immature Granulocytes: 0.03 10*3/uL (ref 0.00–0.07)
Basophils Absolute: 0 10*3/uL (ref 0.0–0.1)
Basophils Relative: 1 %
Eosinophils Absolute: 0.1 10*3/uL (ref 0.0–0.5)
Eosinophils Relative: 1 %
HCT: 32.4 % — ABNORMAL LOW (ref 36.0–46.0)
Hemoglobin: 10 g/dL — ABNORMAL LOW (ref 12.0–15.0)
Immature Granulocytes: 1 %
Lymphocytes Relative: 13 %
Lymphs Abs: 0.8 10*3/uL (ref 0.7–4.0)
MCH: 30.9 pg (ref 26.0–34.0)
MCHC: 30.9 g/dL (ref 30.0–36.0)
MCV: 100 fL (ref 80.0–100.0)
Monocytes Absolute: 0.6 10*3/uL (ref 0.1–1.0)
Monocytes Relative: 10 %
Neutro Abs: 4.4 10*3/uL (ref 1.7–7.7)
Neutrophils Relative %: 74 %
Platelets: 234 10*3/uL (ref 150–400)
RBC: 3.24 MIL/uL — ABNORMAL LOW (ref 3.87–5.11)
RDW: 16.7 % — ABNORMAL HIGH (ref 11.5–15.5)
WBC: 5.9 10*3/uL (ref 4.0–10.5)
nRBC: 0 % (ref 0.0–0.2)

## 2022-10-12 LAB — COMPREHENSIVE METABOLIC PANEL
ALT: 66 U/L — ABNORMAL HIGH (ref 0–44)
AST: 139 U/L — ABNORMAL HIGH (ref 15–41)
Albumin: 3 g/dL — ABNORMAL LOW (ref 3.5–5.0)
Alkaline Phosphatase: 91 U/L (ref 38–126)
Anion gap: 10 (ref 5–15)
BUN: 26 mg/dL — ABNORMAL HIGH (ref 8–23)
CO2: 26 mmol/L (ref 22–32)
Calcium: 8.2 mg/dL — ABNORMAL LOW (ref 8.9–10.3)
Chloride: 99 mmol/L (ref 98–111)
Creatinine, Ser: 1.36 mg/dL — ABNORMAL HIGH (ref 0.44–1.00)
GFR, Estimated: 38 mL/min — ABNORMAL LOW (ref >60)
Glucose, Bld: 174 mg/dL — ABNORMAL HIGH (ref 70–99)
Potassium: 4.2 mmol/L (ref 3.5–5.1)
Sodium: 135 mmol/L (ref 135–145)
Total Bilirubin: 0.7 mg/dL (ref 0.3–1.2)
Total Protein: 6.3 g/dL — ABNORMAL LOW (ref 6.5–8.1)

## 2022-10-12 LAB — PROTIME-INR
INR: 2 — ABNORMAL HIGH (ref 0.8–1.2)
Prothrombin Time: 22.5 seconds — ABNORMAL HIGH (ref 11.4–15.2)

## 2022-10-12 LAB — BRAIN NATRIURETIC PEPTIDE: B Natriuretic Peptide: 257.9 pg/mL — ABNORMAL HIGH (ref 0.0–100.0)

## 2022-10-12 LAB — TROPONIN I (HIGH SENSITIVITY): Troponin I (High Sensitivity): 16 ng/L (ref <18)

## 2022-10-12 MED ORDER — SODIUM CHLORIDE 0.9 % IV BOLUS
500.0000 mL | Freq: Once | INTRAVENOUS | Status: AC
  Administered 2022-10-12: 500 mL via INTRAVENOUS

## 2022-10-12 NOTE — ED Provider Notes (Signed)
  Physical Exam  BP (!) 114/50   Pulse 63   Temp (!) 97.4 F (36.3 C)   Resp 16   SpO2 99%   Physical Exam  Procedures  Procedures  ED Course / MDM    Medical Decision Making I was called by nursing staff about her blood pressure.  Her blood pressure was in the 90s earlier today.  Patient has not been eating today.  I repeated the blood work on her and her creatinine is baseline.  Her INR is 2.3.  Patient was given 500 cc bolus and blood pressure went up to the low 100s.  Patient is still pending placement does not need medical admission at this time  Amount and/or Complexity of Data Reviewed Labs: ordered.  Risk Prescription drug management.          Drenda Freeze, MD 10/12/22 2001

## 2022-10-12 NOTE — ED Provider Notes (Signed)
Emergency Medicine Observation Re-evaluation Note  [name] is a 89 y.o. female, seen on rounds today.  Pt initially presented to the ED for complaints of Placement Currently, the patient is resting comfortably in bed.  Physical Exam  BP (!) 146/67   Pulse 69   Temp (!) 97.4 F (36.3 C)   Resp 18   SpO2 98%  Physical Exam   ED Course / MDM  EKG:   I have reviewed the labs performed to date as well as medications administered while in observation.  Recent changes in the last 24 hours include patient had been ordered to have daily PT with INR.  Patient's last INR was 2.3.  Do not see any clinical indication to continue this and therefore have discontinued this..  Plan  Current plan is for placement.    [name], MD 10/12/22 [phone]

## 2022-10-12 NOTE — ED Notes (Signed)
Cancelled Protime INR order because Dr. Zenia Resides stated that patient does not need it done daily because she is within the normal range.

## 2022-10-12 NOTE — Progress Notes (Addendum)
This CSW received a call from [place] at [facility]  [phone]) stating the pt can come to the SNF on 1/23. TOC following or any additional d/c needs.

## 2022-10-13 DIAGNOSIS — R350 Frequency of micturition: Secondary | ICD-10-CM | POA: Diagnosis not present

## 2022-10-13 DIAGNOSIS — R040 Epistaxis: Secondary | ICD-10-CM | POA: Diagnosis not present

## 2022-10-13 DIAGNOSIS — R54 Age-related physical debility: Secondary | ICD-10-CM | POA: Diagnosis not present

## 2022-10-13 NOTE — ED Notes (Addendum)
On hourly rounding, patient had blood on face. On inspection, patient's nose was bleeding and had dripped to the sides of her mouth. This RN cleaned the patient's nose, face, dressed in clean gown and applied gauze to left nare to stop bleeding. Pt controlled, notified Rob, PA-C ED provider. ED provider at patient bedside.

## 2022-10-13 NOTE — ED Provider Notes (Signed)
Emergency Medicine Observation Re-evaluation Note  [name] is a 89 y.o. female, seen on rounds today.  Pt initially presented to the ED for complaints of Placement Currently, the patient is resting comfortably.  Physical Exam  BP 120/72 (BP Location: Left Arm)   Pulse 65   Temp 98 F (36.7 C) (Oral)   Resp 14   SpO2 98%  Physical Exam General: NAD   ED Course / MDM  EKG:   I have reviewed the labs performed to date as well as medications administered while in observation.  Recent changes in the last 24 hours include nosebleed occurred early this AM. Resolved with pressure.  Plan  Current plan is for placement.    Renee Merino, MD 10/13/22 [phone]

## 2022-10-14 DIAGNOSIS — D62 Acute posthemorrhagic anemia: Secondary | ICD-10-CM | POA: Diagnosis not present

## 2022-10-14 DIAGNOSIS — I509 Heart failure, unspecified: Secondary | ICD-10-CM | POA: Diagnosis not present

## 2022-10-14 DIAGNOSIS — K259 Gastric ulcer, unspecified as acute or chronic, without hemorrhage or perforation: Secondary | ICD-10-CM | POA: Diagnosis not present

## 2022-10-14 DIAGNOSIS — Z9181 History of falling: Secondary | ICD-10-CM | POA: Diagnosis not present

## 2022-10-14 DIAGNOSIS — I10 Essential (primary) hypertension: Secondary | ICD-10-CM | POA: Diagnosis not present

## 2022-10-14 DIAGNOSIS — R079 Chest pain, unspecified: Secondary | ICD-10-CM | POA: Diagnosis not present

## 2022-10-14 DIAGNOSIS — L89626 Pressure-induced deep tissue damage of left heel: Secondary | ICD-10-CM | POA: Diagnosis not present

## 2022-10-14 DIAGNOSIS — M6281 Muscle weakness (generalized): Secondary | ICD-10-CM | POA: Diagnosis not present

## 2022-10-14 DIAGNOSIS — R0902 Hypoxemia: Secondary | ICD-10-CM | POA: Diagnosis not present

## 2022-10-14 DIAGNOSIS — S81801A Unspecified open wound, right lower leg, initial encounter: Secondary | ICD-10-CM | POA: Diagnosis not present

## 2022-10-14 DIAGNOSIS — B189 Chronic viral hepatitis, unspecified: Secondary | ICD-10-CM | POA: Diagnosis not present

## 2022-10-14 DIAGNOSIS — E119 Type 2 diabetes mellitus without complications: Secondary | ICD-10-CM | POA: Diagnosis not present

## 2022-10-14 DIAGNOSIS — R5383 Other fatigue: Secondary | ICD-10-CM | POA: Diagnosis not present

## 2022-10-14 DIAGNOSIS — R040 Epistaxis: Secondary | ICD-10-CM | POA: Diagnosis not present

## 2022-10-14 DIAGNOSIS — I25118 Atherosclerotic heart disease of native coronary artery with other forms of angina pectoris: Secondary | ICD-10-CM | POA: Diagnosis not present

## 2022-10-14 DIAGNOSIS — I4891 Unspecified atrial fibrillation: Secondary | ICD-10-CM | POA: Diagnosis not present

## 2022-10-14 DIAGNOSIS — R7401 Elevation of levels of liver transaminase levels: Secondary | ICD-10-CM | POA: Diagnosis not present

## 2022-10-14 DIAGNOSIS — R609 Edema, unspecified: Secondary | ICD-10-CM | POA: Diagnosis not present

## 2022-10-14 DIAGNOSIS — Z951 Presence of aortocoronary bypass graft: Secondary | ICD-10-CM | POA: Diagnosis not present

## 2022-10-14 DIAGNOSIS — R54 Age-related physical debility: Secondary | ICD-10-CM | POA: Diagnosis not present

## 2022-10-14 DIAGNOSIS — F5105 Insomnia due to other mental disorder: Secondary | ICD-10-CM | POA: Diagnosis not present

## 2022-10-14 DIAGNOSIS — R21 Rash and other nonspecific skin eruption: Secondary | ICD-10-CM | POA: Diagnosis not present

## 2022-10-14 DIAGNOSIS — R350 Frequency of micturition: Secondary | ICD-10-CM | POA: Diagnosis not present

## 2022-10-14 DIAGNOSIS — R946 Abnormal results of thyroid function studies: Secondary | ICD-10-CM | POA: Diagnosis not present

## 2022-10-14 DIAGNOSIS — L89620 Pressure ulcer of left heel, unstageable: Secondary | ICD-10-CM | POA: Diagnosis not present

## 2022-10-14 DIAGNOSIS — L89614 Pressure ulcer of right heel, stage 4: Secondary | ICD-10-CM | POA: Diagnosis not present

## 2022-10-14 DIAGNOSIS — Z7401 Bed confinement status: Secondary | ICD-10-CM | POA: Diagnosis not present

## 2022-10-14 DIAGNOSIS — R279 Unspecified lack of coordination: Secondary | ICD-10-CM | POA: Diagnosis not present

## 2022-10-14 DIAGNOSIS — Z7901 Long term (current) use of anticoagulants: Secondary | ICD-10-CM | POA: Diagnosis not present

## 2022-10-14 DIAGNOSIS — Z79899 Other long term (current) drug therapy: Secondary | ICD-10-CM | POA: Diagnosis not present

## 2022-10-14 DIAGNOSIS — Z8619 Personal history of other infectious and parasitic diseases: Secondary | ICD-10-CM | POA: Diagnosis not present

## 2022-10-14 DIAGNOSIS — R2681 Unsteadiness on feet: Secondary | ICD-10-CM | POA: Diagnosis not present

## 2022-10-14 DIAGNOSIS — Z952 Presence of prosthetic heart valve: Secondary | ICD-10-CM | POA: Diagnosis not present

## 2022-10-14 DIAGNOSIS — I499 Cardiac arrhythmia, unspecified: Secondary | ICD-10-CM | POA: Diagnosis not present

## 2022-10-14 DIAGNOSIS — M1990 Unspecified osteoarthritis, unspecified site: Secondary | ICD-10-CM | POA: Diagnosis not present

## 2022-10-14 DIAGNOSIS — I480 Paroxysmal atrial fibrillation: Secondary | ICD-10-CM | POA: Diagnosis not present

## 2022-10-14 DIAGNOSIS — R52 Pain, unspecified: Secondary | ICD-10-CM | POA: Diagnosis not present

## 2022-10-14 DIAGNOSIS — F5101 Primary insomnia: Secondary | ICD-10-CM | POA: Diagnosis not present

## 2022-10-14 DIAGNOSIS — E785 Hyperlipidemia, unspecified: Secondary | ICD-10-CM | POA: Diagnosis not present

## 2022-10-14 DIAGNOSIS — I5032 Chronic diastolic (congestive) heart failure: Secondary | ICD-10-CM | POA: Diagnosis not present

## 2022-10-14 DIAGNOSIS — R278 Other lack of coordination: Secondary | ICD-10-CM | POA: Diagnosis not present

## 2022-10-14 DIAGNOSIS — L89612 Pressure ulcer of right heel, stage 2: Secondary | ICD-10-CM | POA: Diagnosis not present

## 2022-10-14 DIAGNOSIS — L03115 Cellulitis of right lower limb: Secondary | ICD-10-CM | POA: Diagnosis not present

## 2022-10-14 DIAGNOSIS — F4322 Adjustment disorder with anxiety: Secondary | ICD-10-CM | POA: Diagnosis not present

## 2022-10-14 DIAGNOSIS — I2510 Atherosclerotic heart disease of native coronary artery without angina pectoris: Secondary | ICD-10-CM | POA: Diagnosis not present

## 2022-10-14 DIAGNOSIS — Z20822 Contact with and (suspected) exposure to covid-19: Secondary | ICD-10-CM | POA: Diagnosis not present

## 2022-10-14 DIAGNOSIS — D649 Anemia, unspecified: Secondary | ICD-10-CM | POA: Diagnosis not present

## 2022-10-14 DIAGNOSIS — F411 Generalized anxiety disorder: Secondary | ICD-10-CM | POA: Diagnosis not present

## 2022-10-14 DIAGNOSIS — N1832 Chronic kidney disease, stage 3b: Secondary | ICD-10-CM | POA: Diagnosis not present

## 2022-10-14 NOTE — Progress Notes (Addendum)
This CSW spoke with Fatima Sanger who states the pt can come anytime. This CSW notified RN and EDP. Will also contact pt's husband to notify. RN to call [facility] after report is given.  Addend @ 8:26 AM Husband notified. Husband is bringing the pt clothes. RN notified.

## 2022-10-14 NOTE — ED Notes (Signed)
RN attempted to call [name] at [facility] to give report to 200 hall nurse but was unable to leave VM, will attempt again

## 2022-10-14 NOTE — ED Notes (Signed)
[facility] called for transportation  

## 2022-10-14 NOTE — Progress Notes (Signed)
This CSW received a call from [place] at [facility] requesting AVS be faxed. AVS sent.

## 2022-10-14 NOTE — ED Notes (Signed)
RN called Fatima Sanger at [facility] and gave report to [place]

## 2022-10-14 NOTE — ED Provider Notes (Signed)
Patient being discharged to SNF, [facility] this morning.  Appears stable for discharge.   Lorelle Gibbs, [place] 10/14/22 [phone]

## 2022-10-15 DIAGNOSIS — L89626 Pressure-induced deep tissue damage of left heel: Secondary | ICD-10-CM | POA: Diagnosis not present

## 2022-10-17 DIAGNOSIS — F411 Generalized anxiety disorder: Secondary | ICD-10-CM | POA: Diagnosis not present

## 2022-10-17 DIAGNOSIS — L03115 Cellulitis of right lower limb: Secondary | ICD-10-CM | POA: Diagnosis not present

## 2022-10-19 DIAGNOSIS — R946 Abnormal results of thyroid function studies: Secondary | ICD-10-CM | POA: Diagnosis not present

## 2022-10-19 DIAGNOSIS — L03115 Cellulitis of right lower limb: Secondary | ICD-10-CM | POA: Diagnosis not present

## 2022-10-22 ENCOUNTER — Other Ambulatory Visit: Payer: Self-pay | Admitting: *Deleted

## 2022-10-22 DIAGNOSIS — L89626 Pressure-induced deep tissue damage of left heel: Secondary | ICD-10-CM | POA: Diagnosis not present

## 2022-10-22 DIAGNOSIS — R946 Abnormal results of thyroid function studies: Secondary | ICD-10-CM | POA: Diagnosis not present

## 2022-10-22 DIAGNOSIS — R7401 Elevation of levels of liver transaminase levels: Secondary | ICD-10-CM | POA: Diagnosis not present

## 2022-10-22 DIAGNOSIS — F411 Generalized anxiety disorder: Secondary | ICD-10-CM | POA: Diagnosis not present

## 2022-10-22 DIAGNOSIS — L03115 Cellulitis of right lower limb: Secondary | ICD-10-CM | POA: Diagnosis not present

## 2022-10-22 NOTE — Patient Outreach (Signed)
Mrs. Awad resides in [facility]. Screening for potential [facility] care coordination services as benefit of health plan and Primary Care Provider.  Update received from Bryson Ha, Conservation officer, nature. Mrs. Bensinger's transition plan is for long term care.  No identifiable [facility] care coordination needs at this time.   Marthenia Rolling, MSN, RN,BSN [facility] Acute Care Coordinator [phone] (Direct dial)

## 2022-10-27 DIAGNOSIS — F5101 Primary insomnia: Secondary | ICD-10-CM | POA: Diagnosis not present

## 2022-10-27 DIAGNOSIS — F4322 Adjustment disorder with anxiety: Secondary | ICD-10-CM | POA: Diagnosis not present

## 2022-10-28 DIAGNOSIS — D649 Anemia, unspecified: Secondary | ICD-10-CM | POA: Diagnosis not present

## 2022-10-28 DIAGNOSIS — Z79899 Other long term (current) drug therapy: Secondary | ICD-10-CM | POA: Diagnosis not present

## 2022-10-28 DIAGNOSIS — B189 Chronic viral hepatitis, unspecified: Secondary | ICD-10-CM | POA: Diagnosis not present

## 2022-10-28 DIAGNOSIS — I10 Essential (primary) hypertension: Secondary | ICD-10-CM | POA: Diagnosis not present

## 2022-10-29 DIAGNOSIS — L89626 Pressure-induced deep tissue damage of left heel: Secondary | ICD-10-CM | POA: Diagnosis not present

## 2022-10-30 NOTE — Progress Notes (Signed)
Cardiology Office Note   Date:  10/31/2022   ID:  Renee, [name] [birth year], MRN [id number]  PCP:  Gaynelle Arabian, MD    No chief complaint on file.  CAD  Wt Readings from Last 3 Encounters:  10/31/22 151 lb (68.5 kg)  10/04/22 151 lb 3.8 oz (68.6 kg)  07/10/22 151 lb 3.2 oz (68.6 kg)       History of Present Illness: [name] is a 89 y.o. female  with a hx of CAD and aortic valve disease status post CABG plus mechanical AVR done in [place] in the 1990s.  Subsequent PCI to the RCA with BMS, HTN, HL, diabetes, prior GI bleed, CKD, diastolic CHF, PAF.    She was evaluated in 7/16 for anginal symptoms.  LHC demonstrated severe native three-vessel disease. LIMA-LAD was patent, SVG-diagonal patent, SVG-OM/PDA occluded. The second half of the OM/PDA graft was open. RCA stents were patent. She had an ostial RCA lesion of 75% which was treated with a Synergy DES. Because she needs Coumadin for her mechanical AVR, she was continued on Coumadin plus Plavix only (no aspirin).   She had some melena after the procedure.  She has had several episodes of GI bleeding in the past.   Patient had sepsis due to gram-negative bacteremia E. coli related to UTI 11/2018.  Echo with normal LV function moderate LVH normally functioning mechanical aortic valve possible mitral valve with small mobile vegetation in the anterior leaflet and small ASD suggested by Doppler.  TEE was negative for vegetations. Patient underwent successful DCCV 08/15/19 for paroxysmal atrial fibrillation.  Shortness of breath improved after this procedure.   She had more GI bleeding in February 2021.  Anticoagulation was held.  She underwent enteroscopy.  She had an argon laser treatment with EGD.  Hemoglobin was 7.3 at the time of discharge.  INR had slightly increased and was therapeutic by November 11, 2019.  It was recommended that aspirin be held for 4 weeks.  Due to bleeding risk, she has not been treated with  theinopyridine, more recently.    At her appt in 2/21, she reported weakness and was found to be in a junctional rhythm.   We stopped metoprolol.  "SHe is on Amio to maintain NSR.  Now with junctional rhythm on ECG. May be related to anemia."   Repeat ECG in 3/21 showed sinus rhythm.   She had some high blood pressure readings in March 2023.  Plan was to add amlodipine if home readings were high.   She had a LUMBAR MICRODISCECTOMY Date of procedure: 06/02/22   She has had several falls.  Broken ankle in 6/23 from slipped disc.     Hospitalized in January 2024.  She had COVID as well as right lower extremity cellulitis.  She was treated with antibiotics.  She was to be placed at a skilled nursing facility.   Past Medical History:  Diagnosis Date   Anemia    Anginal pain ([place])    Arteriovenous malformation of gastrointestinal tract    Arthritis    "fingers mostly" (04/12/2015)   Carotid artery disease ([place])    Carotid US 3/17:  A999333 RICA; [id number] LICA; Elevated bilateral subclavian artery velocities >>f/u 1 year. // Carotid US 4/18: R 40-59; L 1-39 >> FU 1 year // Carotid US 10/2018: R 40-59; L 1-39, L subclavian stenosis    CHF (congestive heart failure) (H[facility])    Chronic kidney disease (CKD), stage III (moderate) ([place])  Chronic lower GI bleeding    "today; last time was ~ 8 yr ago; used to have them often before that too" (02/11/2013)   Coronary artery disease    DJD (degenerative joint disease)    Dysrhythmia    Heart murmur    History of blood transfusion    "a few times over the years; usually related to my Coumadin" (04/12/2015   History of gout    Hyperlipemia    Hypertension    Macula lutea degeneration    Old MI (myocardial infarction)    "a coulple /dr in 02/2008; I never even knew I'd had them" (04/12/2015)   Type II diabetes mellitus ([place])    no medications, diet controlled    Past Surgical History:  Procedure Laterality Date   AORTIC VALVE REPLACEMENT  09/23/1991    St. Jude   APPENDECTOMY  09/23/1951   BIOPSY  11/06/2019   Procedure: BIOPSY;  Surgeon: Doran Stabler, MD;  Location: [facility] ENDOSCOPY;  Service: Gastroenterology;;   CARDIAC CATHETERIZATION  ~1990   CARDIAC CATHETERIZATION  04/12/2015   Procedure: Coronary Stent Intervention;  Surgeon: Jettie Booze, MD;    SYNERGY DES 3.5X16 to the ostial RCA    CARDIAC CATHETERIZATION  04/12/2015   Procedure: Coronary/Graft Angiography;  Surgeon: Eloy End, MD; LAD & CFX 100%, patent LIMA-LAD, SVG-D1; SVG-OM-PDA first limb 100%, 2nd limb patent; oRCA 75%>0 w/ stent   CARDIAC VALVE REPLACEMENT  09/22/1992   St. Jude/notes 10/29/2003 (02/11/2013)   CARDIOVERSION N/A 08/15/2019   Procedure: CARDIOVERSION;  Surgeon: Thayer Headings, MD;  Location: [facility] ENDOSCOPY;  Service: Cardiovascular;  Laterality: N/A;   COLONOSCOPY N/A 02/13/2013   Procedure: COLONOSCOPY;  Surgeon: Lafayette Dragon, MD;  Location: [facility] ENDOSCOPY;  Service: Endoscopy;  Laterality: N/A;   CORONARY ANGIOPLASTY WITH STENT PLACEMENT  2009+   "3 at least; put in 1 stent at a time" (02/11/2013)   CORONARY ARTERY BYPASS GRAFT  09/22/1988   LIMA-LAD, SVG-OM-PDA, SVG-D1   DILATION AND CURETTAGE OF UTERUS  05/24/1959   'after a miscarriage" (02/11/2013)   ENTEROSCOPY N/A 02/13/2013   Procedure: ENTEROSCOPY;  Surgeon: Lafayette Dragon, MD;  Location: [facility] ENDOSCOPY;  Service: Endoscopy;  Laterality: N/A;   ENTEROSCOPY N/A 11/06/2019   Procedure: ENTEROSCOPY;  Surgeon: Doran Stabler, MD;  Location: [place];  Service: Gastroenterology;  Laterality: N/A;   ESOPHAGOGASTRODUODENOSCOPY (EGD) WITH PROPOFOL N/A 12/01/2019   Procedure: ESOPHAGOGASTRODUODENOSCOPY (EGD) WITH PROPOFOL;  Surgeon: Mauri Pole, MD;  Location: [facility] ENDOSCOPY;  Service: Endoscopy;  Laterality: N/A;   EXPLORATORY LAPAROTOMY  02/24/2008   which revealed a retroperitoneal hematoma and bleeding from the right external iliac artery/notes 03/02/2008 (02/11/2013)     FLEXIBLE SIGMOIDOSCOPY N/A 04/21/2015   Procedure: FLEXIBLE SIGMOIDOSCOPY;  Surgeon: Ladene Artist, MD;  Location: [facility] ENDOSCOPY;  Service: Endoscopy;  Laterality: N/A;   HOT HEMOSTASIS N/A 11/06/2019   Procedure: HOT HEMOSTASIS (ARGON PLASMA COAGULATION/BICAP);  Surgeon: Doran Stabler, MD;  Location: [place];  Service: Gastroenterology;  Laterality: N/A;   LUMBAR LAMINECTOMY/DECOMPRESSION MICRODISCECTOMY Left 06/02/2022   Procedure: LEFT LUMBAR FOUR-FIVE MICRODISCECTOMY;  Surgeon: Newman Pies, MD;  Location: [place];  Service: Neurosurgery;  Laterality: Left;  3C   TEE WITHOUT CARDIOVERSION N/A 12/01/2018   Procedure: TRANSESOPHAGEAL ECHOCARDIOGRAM (TEE);  Surgeon: Jerline Pain, MD;  Location: [facility] ENDOSCOPY;  Service: Cardiovascular;  Laterality: N/A;     Current Outpatient Medications  Medication Sig Dispense Refill   amiodarone (PACERONE) 200 MG tablet  Take 1 tablet by mouth once daily (Patient taking differently: Take 200 mg by mouth in the morning.) 90 tablet 2   Bevacizumab (AVASTIN IV) Inject 1 Dose into the vein every 8 (eight) weeks.     clindamycin (CLEOCIN) 300 MG capsule Take 300 mg by mouth See admin instructions. Take as directed to pre-treat for dental appointments     ezetimibe (ZETIA) 10 MG tablet Take 1 tablet by mouth once daily (Patient taking differently: Take 10 mg by mouth in the morning.) 90 tablet 2   FeFum-FePo-FA-B Cmp-C-Zn-Mn-Cu (SE-TAN PLUS) 162-115.2-1 MG CAPS Take 1 capsule by mouth 2 (two) times daily.  3   folic acid (FOLVITE) A999333 MCG tablet Take 400 mcg by mouth in the morning.     isosorbide mononitrate (IMDUR) 60 MG 24 hr tablet Take 1 tablet by mouth once daily (Patient taking differently: Take 60 mg by mouth in the morning.) 90 tablet 2   losartan (COZAAR) 50 MG tablet Take 50 mg by mouth in the morning.     Multiple Vitamin (MULTIVITAMIN WITH MINERALS) TABS Take 1 tablet by mouth daily with breakfast.     Multiple Vitamins-Minerals  (PRESERVISION AREDS 2 PO) Take 1 capsule by mouth 2 (two) times daily.     nitroGLYCERIN (NITROSTAT) 0.4 MG SL tablet DISSOLVE ONE TABLET UNDER THE TONGUE EVERY 5 MINUTES AS NEEDED FOR CHEST PAIN.  DO NOT EXCEED A TOTAL OF 3 DOSES IN 15 MINUTES (Patient taking differently: Place 0.4 mg under the tongue every 5 (five) minutes x 3 doses as needed for chest pain.) 25 tablet 5   Omega-3 Fatty Acids (FISH OIL) 600 MG CAPS Take 1,200 mg by mouth 2 (two) times daily.     oxyCODONE (OXY IR/ROXICODONE) 5 MG immediate release tablet Take 5 mg by mouth every 6 (six) hours as needed for moderate pain or severe pain.     TYLENOL 500 MG tablet Take 500-1,000 mg by mouth every 6 (six) hours as needed for mild pain or headache.     warfarin (JANTOVEN) 2.5 MG tablet TAKE 1/2 TABLET TO 1 TABLET BY MOUTH DAILY AS DIRECTED BY COUMADIN CLINIC (Patient taking differently: Take 1.25-2.5 mg by mouth See admin instructions. Take 1.25 mg by mouth in the evening on Sun/Mon/Wed/Fri/Sat and 2.5 mg on Tues/Thurs) 65 tablet 1   atorvastatin (LIPITOR) 80 MG tablet Take 1 tablet by mouth once daily (Patient taking differently: Take 80 mg by mouth in the morning.) 90 tablet 1   No current facility-administered medications for this visit.    Allergies:   Capzasin [capsaicin], Darvon [propoxyphene], Lisinopril, Septra [sulfamethoxazole-trimethoprim], Voltaren [diclofenac], Warfarin and related, Tobrex [tobramycin], Trazodone and nefazodone, Zolpidem tartrate, and Penicillins    Social History:  The patient  reports that she has never smoked. She has never used smokeless tobacco. She reports that she does not drink alcohol and does not use drugs.   Family History:  The patient's family history includes Heart disease in her father and mother.    ROS:  Please see the history of present illness.   Otherwise, review of systems are positive for leg swelling; andwanting to go home from [facility].   All other systems are reviewed and negative.     PHYSICAL EXAM: VS:  BP 118/60   Pulse 71   Ht 5' (1.524 m)   Wt 151 lb (68.5 kg)   SpO2 99%   BMI 29.49 kg/m  , BMI Body mass index is 29.49 kg/m. GEN: Well nourished, well developed,  in no acute distress HEENT: normal Neck: no JVD, carotid bruits, or masses Cardiac: RRR; crisp S2 click; 2/6 systolic murmur, no rubs, or gallops,bilateral leg edema - R>L Respiratory:  clear to auscultation bilaterally, normal work of breathing GI: soft, nontender, nondistended, + BS MS: no deformity or atrophy Skin: peeling skin on right shin Neuro:  Strength and sensation are intact Psych: euthymic mood, full affect    Recent Labs: 10/08/2022: Magnesium 2.4 10/12/2022: ALT 66; B Natriuretic Peptide 257.9; BUN 26; Creatinine, Ser 1.36; Hemoglobin 10.0; Platelets 234; Potassium 4.2; Sodium 135   Lipid Panel    Component Value Date/Time   CHOL 117 06/29/2019 0953   CHOL 172 05/01/2015 1234   TRIG 153 (H) 06/29/2019 0953   TRIG 248 (H) 05/01/2015 1234   HDL 51 06/29/2019 0953   HDL 46 05/01/2015 1234   CHOLHDL 2.3 06/29/2019 0953   LDLCALC 40 06/29/2019 0953   LDLCALC 76 05/01/2015 1234     Other studies Reviewed: Additional studies/ records that were reviewed today with results demonstrating: Cr 1.36 in Jan 2024.   ASSESSMENT AND PLAN:  CAD: Status post CABG followed by PCI. PAF: rate controlled.  COumadin for stroke prevention.  Chronic anticoagulation: Only needs small doses of Coumadin.  INR makes big jumps with larger doses of Coumadin. Status post AVR: Continue SBE prophylaxis. Hypertension: The current medical regimen is effective;  continue present plan and medications.  Chronic diastolic heart failure: Mild volume overload typically shows up in her legs.  [facility].  Increase furosemide to 40 mg twice daily for 3 days.  Then based on creatinine, can give 40 to 80 mg daily. Hyperlipidemia: Atorvastatin dose was decreased from 80 down to 10 mg  daily.  Increase to at least 40 mg daily check labs.  Handwritten instructions given on the [facility] form for the patient to take back to the facility.  Current medicines are reviewed at length with the patient today.  The patient concerns regarding her medicines were addressed.  The following changes have been made:    Labs/ tests ordered today include:  No orders of the defined types were placed in this encounter.   Recommend 150 minutes/week of aerobic exercise Low fat, low carb, high fiber diet recommended  Disposition:   FU in 6 months   Signed, Larae Grooms, MD  10/31/2022 5:09 PM    [facility] [address], [place], [place]  [address] Phone: [phone]; Fax: [phone]

## 2022-10-31 ENCOUNTER — Ambulatory Visit: Payer: Medicare Other | Attending: Interventional Cardiology | Admitting: Interventional Cardiology

## 2022-10-31 ENCOUNTER — Encounter: Payer: Self-pay | Admitting: Interventional Cardiology

## 2022-10-31 VITALS — BP 118/60 | HR 71 | Ht 60.0 in | Wt 151.0 lb

## 2022-10-31 DIAGNOSIS — I480 Paroxysmal atrial fibrillation: Secondary | ICD-10-CM | POA: Insufficient documentation

## 2022-10-31 DIAGNOSIS — I10 Essential (primary) hypertension: Secondary | ICD-10-CM | POA: Insufficient documentation

## 2022-10-31 DIAGNOSIS — I5032 Chronic diastolic (congestive) heart failure: Secondary | ICD-10-CM | POA: Diagnosis present

## 2022-10-31 DIAGNOSIS — Z7901 Long term (current) use of anticoagulants: Secondary | ICD-10-CM | POA: Diagnosis not present

## 2022-10-31 DIAGNOSIS — I25118 Atherosclerotic heart disease of native coronary artery with other forms of angina pectoris: Secondary | ICD-10-CM | POA: Insufficient documentation

## 2022-10-31 DIAGNOSIS — Z952 Presence of prosthetic heart valve: Secondary | ICD-10-CM | POA: Diagnosis not present

## 2022-10-31 MED ORDER — FUROSEMIDE 40 MG PO TABS: ORAL_TABLET | ORAL | 3 refills | Status: DC

## 2022-10-31 MED ORDER — ATORVASTATIN CALCIUM 40 MG PO TABS: 40.0000 mg | ORAL_TABLET | Freq: Every day | ORAL | 3 refills | Status: AC

## 2022-10-31 NOTE — Patient Instructions (Addendum)
Medication Instructions:   Your physician has recommended you make the following change in your medication: Increase furosemide to 40 mg by mouth twice daily for 3 days then can adjust dose between 40-80 mg daily based on renal function.  Increase atorvastatin to 40 mg by mouth daily  *If you need a refill on your cardiac medications before your next appointment, please call your pharmacy*   Lab Work: none If you have labs (blood work) drawn today and your tests are completely normal, you will receive your results only by: [place] (if you have MyChart) OR A paper copy in the mail If you have any lab test that is abnormal or we need to change your treatment, we will call you to review the results.   Testing/Procedures: none   Follow-Up: At [facility], you and your health needs are our priority.  As part of our continuing mission to provide you with exceptional heart care, we have created designated Provider Care Teams.  These Care Teams include your primary Cardiologist (physician) and Advanced Practice Providers (APPs -  Physician Assistants and Nurse Practitioners) who all work together to provide you with the care you need, when you need it.  We recommend signing up for the patient portal called "MyChart".  Sign up information is provided on this After Visit Summary.  MyChart is used to connect with patients for Virtual Visits (Telemedicine).  Patients are able to view lab/test results, encounter notes, upcoming appointments, etc.  Non-urgent messages can be sent to your provider as well.   To learn more about what you can do with MyChart, go to [email or web address].    Your next appointment:   May 08, 2023 at 11:40  Provider:   Larae Grooms, MD     Other Instructions

## 2022-11-02 DIAGNOSIS — I509 Heart failure, unspecified: Secondary | ICD-10-CM | POA: Diagnosis not present

## 2022-11-02 DIAGNOSIS — L89614 Pressure ulcer of right heel, stage 4: Secondary | ICD-10-CM | POA: Diagnosis not present

## 2022-11-02 DIAGNOSIS — L03115 Cellulitis of right lower limb: Secondary | ICD-10-CM | POA: Diagnosis not present

## 2022-11-02 DIAGNOSIS — R946 Abnormal results of thyroid function studies: Secondary | ICD-10-CM | POA: Diagnosis not present

## 2022-11-05 DIAGNOSIS — L89612 Pressure ulcer of right heel, stage 2: Secondary | ICD-10-CM | POA: Diagnosis not present

## 2022-11-05 DIAGNOSIS — S81801A Unspecified open wound, right lower leg, initial encounter: Secondary | ICD-10-CM | POA: Diagnosis not present

## 2022-11-10 DIAGNOSIS — F411 Generalized anxiety disorder: Secondary | ICD-10-CM | POA: Diagnosis not present

## 2022-11-10 DIAGNOSIS — F5105 Insomnia due to other mental disorder: Secondary | ICD-10-CM | POA: Diagnosis not present

## 2022-11-12 DIAGNOSIS — L89612 Pressure ulcer of right heel, stage 2: Secondary | ICD-10-CM | POA: Diagnosis not present

## 2022-11-12 DIAGNOSIS — S81801A Unspecified open wound, right lower leg, initial encounter: Secondary | ICD-10-CM | POA: Diagnosis not present

## 2022-11-17 DIAGNOSIS — Z79899 Other long term (current) drug therapy: Secondary | ICD-10-CM | POA: Diagnosis not present

## 2022-11-19 DIAGNOSIS — I4819 Other persistent atrial fibrillation: Secondary | ICD-10-CM | POA: Diagnosis not present

## 2022-11-19 DIAGNOSIS — H43813 Vitreous degeneration, bilateral: Secondary | ICD-10-CM | POA: Diagnosis not present

## 2022-11-19 DIAGNOSIS — H353221 Exudative age-related macular degeneration, left eye, with active choroidal neovascularization: Secondary | ICD-10-CM | POA: Diagnosis not present

## 2022-11-19 DIAGNOSIS — H353110 Nonexudative age-related macular degeneration, right eye, stage unspecified: Secondary | ICD-10-CM | POA: Diagnosis not present

## 2022-11-19 DIAGNOSIS — I4891 Unspecified atrial fibrillation: Secondary | ICD-10-CM | POA: Diagnosis not present

## 2022-11-19 DIAGNOSIS — E113293 Type 2 diabetes mellitus with mild nonproliferative diabetic retinopathy without macular edema, bilateral: Secondary | ICD-10-CM | POA: Diagnosis not present

## 2022-11-19 DIAGNOSIS — B189 Chronic viral hepatitis, unspecified: Secondary | ICD-10-CM | POA: Diagnosis not present

## 2022-11-19 DIAGNOSIS — I5032 Chronic diastolic (congestive) heart failure: Secondary | ICD-10-CM | POA: Diagnosis not present

## 2022-11-19 DIAGNOSIS — H35371 Puckering of macula, right eye: Secondary | ICD-10-CM | POA: Diagnosis not present

## 2022-11-26 DIAGNOSIS — L89612 Pressure ulcer of right heel, stage 2: Secondary | ICD-10-CM | POA: Diagnosis not present

## 2022-11-26 DIAGNOSIS — S81801A Unspecified open wound, right lower leg, initial encounter: Secondary | ICD-10-CM | POA: Diagnosis not present

## 2022-12-02 DIAGNOSIS — R1312 Dysphagia, oropharyngeal phase: Secondary | ICD-10-CM | POA: Diagnosis not present

## 2022-12-02 DIAGNOSIS — E119 Type 2 diabetes mellitus without complications: Secondary | ICD-10-CM | POA: Diagnosis not present

## 2022-12-02 DIAGNOSIS — L03115 Cellulitis of right lower limb: Secondary | ICD-10-CM | POA: Diagnosis not present

## 2022-12-02 DIAGNOSIS — N1832 Chronic kidney disease, stage 3b: Secondary | ICD-10-CM | POA: Diagnosis not present

## 2022-12-02 DIAGNOSIS — I5032 Chronic diastolic (congestive) heart failure: Secondary | ICD-10-CM | POA: Diagnosis not present

## 2022-12-03 DIAGNOSIS — E119 Type 2 diabetes mellitus without complications: Secondary | ICD-10-CM | POA: Diagnosis not present

## 2022-12-03 DIAGNOSIS — Z7901 Long term (current) use of anticoagulants: Secondary | ICD-10-CM | POA: Diagnosis not present

## 2022-12-03 DIAGNOSIS — I5032 Chronic diastolic (congestive) heart failure: Secondary | ICD-10-CM | POA: Diagnosis not present

## 2022-12-03 DIAGNOSIS — I4819 Other persistent atrial fibrillation: Secondary | ICD-10-CM | POA: Diagnosis not present

## 2022-12-03 DIAGNOSIS — R1312 Dysphagia, oropharyngeal phase: Secondary | ICD-10-CM | POA: Diagnosis not present

## 2022-12-03 DIAGNOSIS — L03115 Cellulitis of right lower limb: Secondary | ICD-10-CM | POA: Diagnosis not present

## 2022-12-03 DIAGNOSIS — N1832 Chronic kidney disease, stage 3b: Secondary | ICD-10-CM | POA: Diagnosis not present

## 2022-12-08 DIAGNOSIS — N1832 Chronic kidney disease, stage 3b: Secondary | ICD-10-CM | POA: Diagnosis not present

## 2022-12-08 DIAGNOSIS — I5032 Chronic diastolic (congestive) heart failure: Secondary | ICD-10-CM | POA: Diagnosis not present

## 2022-12-08 DIAGNOSIS — L03115 Cellulitis of right lower limb: Secondary | ICD-10-CM | POA: Diagnosis not present

## 2022-12-08 DIAGNOSIS — E119 Type 2 diabetes mellitus without complications: Secondary | ICD-10-CM | POA: Diagnosis not present

## 2022-12-08 DIAGNOSIS — R1312 Dysphagia, oropharyngeal phase: Secondary | ICD-10-CM | POA: Diagnosis not present

## 2022-12-15 DIAGNOSIS — I10 Essential (primary) hypertension: Secondary | ICD-10-CM | POA: Diagnosis not present

## 2022-12-16 DIAGNOSIS — N390 Urinary tract infection, site not specified: Secondary | ICD-10-CM | POA: Diagnosis not present

## 2022-12-17 DIAGNOSIS — E039 Hypothyroidism, unspecified: Secondary | ICD-10-CM | POA: Diagnosis not present

## 2022-12-18 DIAGNOSIS — E785 Hyperlipidemia, unspecified: Secondary | ICD-10-CM | POA: Diagnosis not present

## 2022-12-18 DIAGNOSIS — Z7901 Long term (current) use of anticoagulants: Secondary | ICD-10-CM | POA: Diagnosis not present

## 2022-12-18 DIAGNOSIS — I4819 Other persistent atrial fibrillation: Secondary | ICD-10-CM | POA: Diagnosis not present

## 2022-12-18 DIAGNOSIS — D649 Anemia, unspecified: Secondary | ICD-10-CM | POA: Diagnosis not present

## 2022-12-23 DIAGNOSIS — E039 Hypothyroidism, unspecified: Secondary | ICD-10-CM | POA: Diagnosis not present

## 2022-12-23 DIAGNOSIS — R82998 Other abnormal findings in urine: Secondary | ICD-10-CM | POA: Diagnosis not present

## 2023-01-08 DIAGNOSIS — Z7901 Long term (current) use of anticoagulants: Secondary | ICD-10-CM | POA: Diagnosis not present

## 2023-01-09 DIAGNOSIS — Z7901 Long term (current) use of anticoagulants: Secondary | ICD-10-CM | POA: Diagnosis not present

## 2023-01-13 DIAGNOSIS — I5032 Chronic diastolic (congestive) heart failure: Secondary | ICD-10-CM | POA: Diagnosis not present

## 2023-01-13 DIAGNOSIS — E7849 Other hyperlipidemia: Secondary | ICD-10-CM | POA: Diagnosis not present

## 2023-01-13 DIAGNOSIS — D649 Anemia, unspecified: Secondary | ICD-10-CM | POA: Diagnosis not present

## 2023-01-13 DIAGNOSIS — Z79899 Other long term (current) drug therapy: Secondary | ICD-10-CM | POA: Diagnosis not present

## 2023-01-13 DIAGNOSIS — E785 Hyperlipidemia, unspecified: Secondary | ICD-10-CM | POA: Diagnosis not present

## 2023-01-14 DIAGNOSIS — I10 Essential (primary) hypertension: Secondary | ICD-10-CM | POA: Diagnosis not present

## 2023-01-20 DIAGNOSIS — Z7901 Long term (current) use of anticoagulants: Secondary | ICD-10-CM | POA: Diagnosis not present

## 2023-01-28 DIAGNOSIS — H353110 Nonexudative age-related macular degeneration, right eye, stage unspecified: Secondary | ICD-10-CM | POA: Diagnosis not present

## 2023-01-28 DIAGNOSIS — H35371 Puckering of macula, right eye: Secondary | ICD-10-CM | POA: Diagnosis not present

## 2023-01-28 DIAGNOSIS — H43813 Vitreous degeneration, bilateral: Secondary | ICD-10-CM | POA: Diagnosis not present

## 2023-01-28 DIAGNOSIS — E113293 Type 2 diabetes mellitus with mild nonproliferative diabetic retinopathy without macular edema, bilateral: Secondary | ICD-10-CM | POA: Diagnosis not present

## 2023-01-28 DIAGNOSIS — H353220 Exudative age-related macular degeneration, left eye, stage unspecified: Secondary | ICD-10-CM | POA: Diagnosis not present

## 2023-01-29 DIAGNOSIS — Z7901 Long term (current) use of anticoagulants: Secondary | ICD-10-CM | POA: Diagnosis not present

## 2023-02-09 DIAGNOSIS — F5101 Primary insomnia: Secondary | ICD-10-CM | POA: Diagnosis not present

## 2023-02-09 DIAGNOSIS — F411 Generalized anxiety disorder: Secondary | ICD-10-CM | POA: Diagnosis not present

## 2023-02-17 DIAGNOSIS — Z7901 Long term (current) use of anticoagulants: Secondary | ICD-10-CM | POA: Diagnosis not present

## 2023-02-21 DIAGNOSIS — I10 Essential (primary) hypertension: Secondary | ICD-10-CM | POA: Diagnosis not present

## 2023-02-21 DIAGNOSIS — E7849 Other hyperlipidemia: Secondary | ICD-10-CM | POA: Diagnosis not present

## 2023-02-21 DIAGNOSIS — E039 Hypothyroidism, unspecified: Secondary | ICD-10-CM | POA: Diagnosis not present

## 2023-02-21 DIAGNOSIS — I4819 Other persistent atrial fibrillation: Secondary | ICD-10-CM | POA: Diagnosis not present

## 2023-02-21 DIAGNOSIS — I4891 Unspecified atrial fibrillation: Secondary | ICD-10-CM | POA: Diagnosis not present

## 2023-02-21 DIAGNOSIS — I5032 Chronic diastolic (congestive) heart failure: Secondary | ICD-10-CM | POA: Diagnosis not present

## 2023-02-23 DIAGNOSIS — M7989 Other specified soft tissue disorders: Secondary | ICD-10-CM | POA: Diagnosis not present

## 2023-02-23 DIAGNOSIS — R6889 Other general symptoms and signs: Secondary | ICD-10-CM | POA: Diagnosis not present

## 2023-02-26 DIAGNOSIS — Z7901 Long term (current) use of anticoagulants: Secondary | ICD-10-CM | POA: Diagnosis not present

## 2023-02-26 DIAGNOSIS — E039 Hypothyroidism, unspecified: Secondary | ICD-10-CM | POA: Diagnosis not present

## 2023-03-28 DIAGNOSIS — E039 Hypothyroidism, unspecified: Secondary | ICD-10-CM | POA: Diagnosis not present

## 2023-03-28 DIAGNOSIS — D6869 Other thrombophilia: Secondary | ICD-10-CM | POA: Diagnosis not present

## 2023-03-28 DIAGNOSIS — F411 Generalized anxiety disorder: Secondary | ICD-10-CM | POA: Diagnosis not present

## 2023-03-28 DIAGNOSIS — I480 Paroxysmal atrial fibrillation: Secondary | ICD-10-CM | POA: Diagnosis not present

## 2023-03-30 DIAGNOSIS — Z79899 Other long term (current) drug therapy: Secondary | ICD-10-CM | POA: Diagnosis not present

## 2023-03-30 DIAGNOSIS — I5032 Chronic diastolic (congestive) heart failure: Secondary | ICD-10-CM | POA: Diagnosis not present

## 2023-03-30 DIAGNOSIS — D649 Anemia, unspecified: Secondary | ICD-10-CM | POA: Diagnosis not present

## 2023-03-30 LAB — LAB REPORT - SCANNED: EGFR: 22

## 2023-03-31 DIAGNOSIS — Z7901 Long term (current) use of anticoagulants: Secondary | ICD-10-CM | POA: Diagnosis not present

## 2023-04-10 DIAGNOSIS — H353220 Exudative age-related macular degeneration, left eye, stage unspecified: Secondary | ICD-10-CM | POA: Diagnosis not present

## 2023-04-10 DIAGNOSIS — H43813 Vitreous degeneration, bilateral: Secondary | ICD-10-CM | POA: Diagnosis not present

## 2023-04-10 DIAGNOSIS — E113293 Type 2 diabetes mellitus with mild nonproliferative diabetic retinopathy without macular edema, bilateral: Secondary | ICD-10-CM | POA: Diagnosis not present

## 2023-04-10 DIAGNOSIS — H35371 Puckering of macula, right eye: Secondary | ICD-10-CM | POA: Diagnosis not present

## 2023-04-10 DIAGNOSIS — H353110 Nonexudative age-related macular degeneration, right eye, stage unspecified: Secondary | ICD-10-CM | POA: Diagnosis not present

## 2023-04-27 DIAGNOSIS — F411 Generalized anxiety disorder: Secondary | ICD-10-CM | POA: Diagnosis not present

## 2023-04-27 DIAGNOSIS — F5101 Primary insomnia: Secondary | ICD-10-CM | POA: Diagnosis not present

## 2023-04-28 DIAGNOSIS — Z7901 Long term (current) use of anticoagulants: Secondary | ICD-10-CM | POA: Diagnosis not present

## 2023-04-30 DIAGNOSIS — L602 Onychogryphosis: Secondary | ICD-10-CM | POA: Diagnosis not present

## 2023-04-30 DIAGNOSIS — I739 Peripheral vascular disease, unspecified: Secondary | ICD-10-CM | POA: Diagnosis not present

## 2023-04-30 DIAGNOSIS — L603 Nail dystrophy: Secondary | ICD-10-CM | POA: Diagnosis not present

## 2023-05-04 DIAGNOSIS — Z7901 Long term (current) use of anticoagulants: Secondary | ICD-10-CM | POA: Diagnosis not present

## 2023-05-07 NOTE — Progress Notes (Signed)
Cardiology Office Note   Date:  05/08/2023   ID:  [name] 08-11-1933, MRN [id number]  PCP:  Blair Heys, MD    No chief complaint on file.  CAD  Wt Readings from Last 3 Encounters:  10/31/22 151 lb (68.5 kg)  10/04/22 151 lb 3.8 oz (68.6 kg)  07/10/22 151 lb 3.2 oz (68.6 kg)       History of Present Illness: [name] is a 89 y.o. female  with a hx of CAD and aortic valve disease status post CABG plus mechanical AVR done in [place] in the 1990s.  Subsequent PCI to the RCA with BMS, HTN, HL, diabetes, prior GI bleed, CKD, diastolic CHF, PAF.    She was evaluated in 7/16 for anginal symptoms.  LHC demonstrated severe native three-vessel disease. LIMA-LAD was patent, SVG-diagonal patent, SVG-OM/PDA occluded. The second half of the OM/PDA graft was open. RCA stents were patent. She had an ostial RCA lesion of 75% which was treated with a Synergy DES. Because she needs Coumadin for her mechanical AVR, she was continued on Coumadin plus Plavix only (no aspirin).   She had some melena after the procedure.  She has had several episodes of GI bleeding in the past.   Patient had sepsis due to gram-negative bacteremia E. coli related to UTI 11/2018.  Echo with normal LV function moderate LVH normally functioning mechanical aortic valve possible mitral valve with small mobile vegetation in the anterior leaflet and small ASD suggested by Doppler.  TEE was negative for vegetations. Patient underwent successful DCCV 08/15/19 for paroxysmal atrial fibrillation.  Shortness of breath improved after this procedure.   She had more GI bleeding in February 2021.  Anticoagulation was held.  She underwent enteroscopy.  She had an argon laser treatment with EGD.  Hemoglobin was 7.3 at the time of discharge.  INR had slightly increased and was therapeutic by November 11, 2019.  It was recommended that aspirin be held for 4 weeks.  Due to bleeding risk, she has not been treated with  theinopyridine, more recently.    At her appt in 2/21, she reported weakness and was found to be in a junctional rhythm.   We stopped metoprolol.  "SHe is on Amio to maintain NSR.  Now with junctional rhythm on ECG. May be related to anemia."   Repeat ECG in 3/21 showed sinus rhythm.   She had some high blood pressure readings in March 2023.  Plan was to add amlodipine if home readings were high.   She had a LUMBAR MICRODISCECTOMY Date of procedure: 06/02/22   She has had several falls.  Broken ankle in 6/23 from slipped disc.     Hospitalized in January 2024.  She had COVID as well as right lower extremity cellulitis.  She was treated with antibiotics.  She was to be placed at a skilled nursing facility.  Leg weakness and is unable to walk which keeps her at [facility].  Had some gel shots for the knee.   Denies : Chest pain. Dizziness. Nitroglycerin use. Orthopnea. Palpitations. Paroxysmal nocturnal dyspnea. Shortness of breath. Syncope.    Still reports leg edema.  Past Medical History:  Diagnosis Date   Anemia    Anginal pain (HCC)    Arteriovenous malformation of gastrointestinal tract    Arthritis    "fingers mostly" (04/12/2015)   Carotid artery disease (HCC)    Carotid US 3/17:  60-79% RICA; 40-59% LICA; Elevated bilateral subclavian artery velocities >>f/u  1 year. // Carotid US 4/18: R 40-59; L 1-39 >> FU 1 year // Carotid US 10/2018: R 40-59; L 1-39, L subclavian stenosis    CHF (congestive heart failure) (HCC)    Chronic kidney disease (CKD), stage III (moderate) (HCC)    Chronic lower GI bleeding    "today; last time was ~ 8 yr ago; used to have them often before that too" (02/11/2013)   Coronary artery disease    DJD (degenerative joint disease)    Dysrhythmia    Heart murmur    History of blood transfusion    "a few times over the years; usually related to my Coumadin" (04/12/2015   History of gout    Hyperlipemia    Hypertension    Macula lutea degeneration    Old MI  (myocardial infarction)    "a coulple /dr in 02/2008; I never even knew I'd had them" (04/12/2015)   Type II diabetes mellitus (HCC)    no medications, diet controlled    Past Surgical History:  Procedure Laterality Date   AORTIC VALVE REPLACEMENT  09/23/1991   St. Jude   APPENDECTOMY  09/23/1951   BIOPSY  11/06/2019   Procedure: BIOPSY;  Surgeon: Sherrilyn Rist, MD;  [facility] END[facility]COPY;  Service: Gastroenterology;;   CARDIAC CATHETERIZATION  ~1990   CARDIAC CATHETERIZATION  04/12/2015   Procedure: Coronary Stent Intervention;  Surgeon: Corky Crafts, MD;    SYNERGY DES 3.5X16 to the ostial RCA    CARDIAC CATHETERIZATION  04/12/2015   Procedure: Coronary/Graft Angiography;  Surgeon: Oren Binet, MD; LAD & CFX 100%, patent LIMA-LAD, SVG-D1; SVG-OM-PDA first limb 100%, 2nd limb patent; oRCA 75%>0 w/ stent   CARDIAC VALVE REPLACEMENT  09/22/1992   St. Jude/notes 10/29/2003 (02/11/2013)   CARDIOVERSION N/A 08/15/2019   Procedure: CARDIOVERSION;  Surgeon: Vesta Mixer, MD;  Location: [facility] ENDOSCOPY;  Service: Cardiovascular;  Laterality: N/A;   COLONOSCOPY N/A 02/13/2013   Procedure: COLONOSCOPY;  Surgeon: Hart Carwin, MD;  Location[facility] ENDOSCOPY;  Service: Endoscopy;  Laterality: N/A;   CORONARY ANGIOPLASTY WITH STENT PLACEMENT  2009+   "3 at least; put in 1 stent at a time" (02/11/2013)   CORONARY ARTERY BYPASS GRAFT  09/22/1988   LIMA-LAD, SVG-OM-PDA, SVG-D1   DILATION AND CURETTAGE OF UTERUS  05/24/1959   'after a miscarriage" (02/11/2013)   ENTEROSCOPY N/A 02/13/2013   Procedure: ENTEROSCOPY;  Surgeon: Hart Carwin, MD;  Location[facility] ENDOSCOPY;  Service: Endoscopy;  Laterality: N/A;   ENTEROSCOPY N/A 11/06/2019   Procedure: ENTEROSCOPY;  Surgeon: Sherrilyn Rist, MD;  Location: [facility] ENDOSCOPY;  Service: Gastroenterology;  Laterality: N/A;   ESOPHAGOGASTRODUODENOSCOPY (EGD) WITH PROPOFOL N/A 12/01/2019   Procedure: ESOPHAGOGASTRODUODENOSCOPY (EGD) WITH PROPOFOL;   Surgeon: Napoleon Form, MD;  Location: [facility] ENDOSCOPY;  Service: Endoscopy;  Laterality: N/A;   EXPLORATORY LAPAROTOMY  02/24/2008   which revealed a retroperitoneal hematoma and bleeding from the right external iliac artery/notes 03/02/2008 (02/11/2013)    FLEXIBLE SIGMOIDOSCOPY N/A 04/21/2015   Procedure: FLEXIBLE SIGMOIDOSCOPY;  Surgeon: Meryl Dare, MD;  Location: [facility] ENDOSCOPY;  Service: Endoscopy;  Laterality: N/A;   HOT HEMOSTASIS N/A 11/06/2019   Procedure: HOT HEMOSTASIS (ARGON PLASMA COAGULATION/BICAP);  Surgeon: Sherrilyn Rist, MD;  Location[facility] ENDOSCOPY;  Service: Gastroenterology;  Laterality: N/A;   LUMBAR LAMINECTOMY/DECOMPRESSION MICRODISCECTOMY Left 06/02/2022   Procedure: LEFT LUMBAR FOUR-FIVE MICRODISCECTOMY;  Surgeon: Tressie Stalker, MD;  Location: [facility] OR;  Service: Neurosurgery;  Laterality: Left;  3C  TEE WITHOUT CARDIOVERSION N/A 12/01/2018   Procedure: TRANSESOPHAGEAL ECHOCARDIOGRAM (TEE);  Surgeon: Jake Bathe, MD;  Location: [facility] ENDOSCOPY;  Service: Cardiovascular;  Laterality: N/A;     Current Outpatient Medications  Medication Sig Dispense Refill   amiodarone (PACERONE) 200 MG tablet Take 1 tablet by mouth once daily (Patient taking differently: Take 200 mg by mouth in the morning.) 90 tablet 2   atorvastatin (LIPITOR) 40 MG tablet Take 1 tablet (40 mg total) by mouth daily. 90 tablet 3   Bevacizumab (AVASTIN IV) Inject 1 Dose into the vein every 8 (eight) weeks.     clindamycin (CLEOCIN) 300 MG capsule Take 300 mg by mouth See admin instructions. Take as directed to pre-treat for dental appointments     ezetimibe (ZETIA) 10 MG tablet Take 1 tablet by mouth once daily (Patient taking differently: Take 10 mg by mouth in the morning.) 90 tablet 2   FeFum-FePo-FA-B Cmp-C-Zn-Mn-Cu (SE-TAN PLUS) 162-115.2-1 MG CAPS Take 1 capsule by mouth 2 (two) times daily.  3   folic acid (FOLVITE) 400 MCG tablet Take 400 mcg by mouth in the morning.     furosemide  (LASIX) 40 MG tablet Take 40 -80 mg by mouth daily based on renal function 90 tablet 3   isosorbide mononitrate (IMDUR) 60 MG 24 hr tablet Take 1 tablet by mouth once daily (Patient taking differently: Take 60 mg by mouth in the morning.) 90 tablet 2   losartan (COZAAR) 50 MG tablet Take 50 mg by mouth in the morning.     Multiple Vitamin (MULTIVITAMIN WITH MINERALS) TABS Take 1 tablet by mouth daily with breakfast.     Multiple Vitamins-Minerals (PRESERVISION AREDS 2 PO) Take 1 capsule by mouth 2 (two) times daily.     nitroGLYCERIN (NITROSTAT) 0.4 MG SL tablet DISSOLVE ONE TABLET UNDER THE TONGUE EVERY 5 MINUTES AS NEEDED FOR CHEST PAIN.  DO NOT EXCEED A TOTAL OF 3 DOSES IN 15 MINUTES (Patient taking differently: Place 0.4 mg under the tongue every 5 (five) minutes x 3 doses as needed for chest pain.) 25 tablet 5   Omega-3 Fatty Acids (FISH OIL) 600 MG CAPS Take 1,200 mg by mouth 2 (two) times daily.     oxyCODONE (OXY IR/ROXICODONE) 5 MG immediate release tablet Take 5 mg by mouth every 6 (six) hours as needed for moderate pain or severe pain.     TYLENOL 500 MG tablet Take 500-1,000 mg by mouth every 6 (six) hours as needed for mild pain or headache.     warfarin (JANTOVEN) 2.5 MG tablet TAKE 1/2 TABLET TO 1 TABLET BY MOUTH DAILY AS DIRECTED BY COUMADIN CLINIC (Patient taking differently: Take 1.25-2.5 mg by mouth See admin instructions. Take 1.25 mg by mouth in the evening on Sun/Mon/Wed/Fri/Sat and 2.5 mg on Tues/Thurs) 65 tablet 1   No current facility-administered medications for this visit.    Allergies:   Capzasin [capsaicin], Darvon [propoxyphene], Lisinopril, Septra [sulfamethoxazole-trimethoprim], Voltaren [diclofenac], Warfarin and related, Tobrex [tobramycin], Trazodone and nefazodone, Zolpidem tartrate, and Penicillins    Social History:  The patient  reports that she has never smoked. She has never used smokeless tobacco. She reports that she does not drink alcohol and does not use  drugs.   Family History:  The patient's family history includes Heart disease in her father and mother.    ROS:  Please see the history of present illness.   Otherwise, review of systems are positive for leg weakness.   All other systems are  reviewed and negative.    PHYSICAL EXAM: VS:  BP (!) 112/58   Pulse 84   SpO2 92%  , BMI There is no height or weight on file to calculate BMI. GEN: Well nourished, well developed, in no acute distress HEENT: normal Neck: no JVD, carotid bruits, or masses Cardiac: Irregularly irregular, S1, crisp S2 click; no murmurs, rubs, or gallops,no edema  Respiratory:  clear to auscultation bilaterally, normal work of breathing GI: soft, nontender, nondistended, + BS MS: no deformity or atrophy Skin: warm and dry, no rash Neuro:  in wheelchair, making weight and hight difficult to obtain Psych: euthymic mood, full affect   EKG:   The ekg ordered Jan 2024 demonstrates AFib, rate controlled   Recent Labs: 10/08/2022: Magnesium 2.4 10/12/2022: ALT 66; B Natriuretic Peptide 257.9; BUN 26; Creatinine, Ser 1.36; Hemoglobin 10.0; Platelets 234; Potassium 4.2; Sodium 135   Lipid Panel    Component Value Date/Time   CHOL 117 06/29/2019 0953   CHOL 172 05/01/2015 1234   TRIG 153 (H) 06/29/2019 0953   TRIG 248 (H) 05/01/2015 1234   HDL 51 06/29/2019 0953   HDL 46 05/01/2015 1234   CHOLHDL 2.3 06/29/2019 0953   LDLCALC 40 06/29/2019 0953   LDLCALC 76 05/01/2015 1234     Other studies Reviewed: Additional studies/ records that were reviewed today with results demonstrating: labs reviewed from Jan, Cr 1.36, A1C 6.1; recent INR up to 4. .   ASSESSMENT AND PLAN:  CAD: No angina.  S/p CABG and RCA stent.   PAF: Rate controlled.  Check with Dr. Kevan Ny regarding what blood work has been done recently.  If nothing is been done recently, would check CBC and electrolytes and TSH.  He is listed as being on amiodarone.  She had issues with hypothyroidism as well.   This will need to be followed closely. Chronic anticoagulation:  Coumadin for stroke prevention.  S/p AVR: needs SBE prophylaxis.  Long-term,Beneficial for them to have INR machine at home to avoid need for frequent trips to [employer] [facility].  Will discuss with Phylis Bougie.   Chronic diastolic heart failure: Still with mild leg swelling.  Continue current meds.  Elevate legs.  HTN: The current medical regimen is effective;  continue present plan and medications. Hyperlipidemia: LDL 74 in May 2023.  Continue atorvastatin.   Current medicines are reviewed at length with the patient today.  The patient concerns regarding her medicines were addressed.  The following changes have been made:  No change  Labs/ tests ordered today include:  No orders of the defined types were placed in this encounter.   Recommend 150 minutes/week of aerobic exercise Low fat, low carb, high fiber diet recommended  Disposition:   FU in 6 months with Dr. Bjorn Pippin; prefers early morning appt   Signed, Lance Muss, MD  05/08/2023 12:18 PM    [facility] [address] [place], [place], [place]  [address] Phone: [phone]; Fax: [phone]

## 2023-05-08 ENCOUNTER — Encounter: Payer: Self-pay | Admitting: Interventional Cardiology

## 2023-05-08 ENCOUNTER — Ambulatory Visit: Payer: Medicare Other | Attending: Interventional Cardiology | Admitting: Interventional Cardiology

## 2023-05-08 VITALS — BP 112/58 | HR 84

## 2023-05-08 DIAGNOSIS — I480 Paroxysmal atrial fibrillation: Secondary | ICD-10-CM | POA: Insufficient documentation

## 2023-05-08 DIAGNOSIS — I25118 Atherosclerotic heart disease of native coronary artery with other forms of angina pectoris: Secondary | ICD-10-CM | POA: Diagnosis not present

## 2023-05-08 DIAGNOSIS — Z952 Presence of prosthetic heart valve: Secondary | ICD-10-CM | POA: Insufficient documentation

## 2023-05-08 DIAGNOSIS — Z7901 Long term (current) use of anticoagulants: Secondary | ICD-10-CM | POA: Insufficient documentation

## 2023-05-08 DIAGNOSIS — I10 Essential (primary) hypertension: Secondary | ICD-10-CM | POA: Diagnosis not present

## 2023-05-08 DIAGNOSIS — I5032 Chronic diastolic (congestive) heart failure: Secondary | ICD-10-CM | POA: Diagnosis not present

## 2023-05-08 NOTE — Patient Instructions (Signed)
Medication Instructions:  Your physician recommends that you continue on your current medications as directed. Please refer to the Current Medication list given to you today.  *If you need a refill on your cardiac medications before your next appointment, please call your pharmacy*   Lab Work: none If you have labs (blood work) drawn today and your tests are completely normal, you will receive your results only by: MyChart Message (if you have MyChart) OR A paper copy in the mail If you have any lab test that is abnormal or we need to change your treatment, we will call you to review the results.   Testing/Procedures: none   Follow-Up: At [facility], you and your health needs are our priority.  As part of our continuing mission to provide you with exceptional heart care, we have created designated Provider Care Teams.  These Care Teams include your primary Cardiologist (physician) and Advanced Practice Providers (APPs -  Physician Assistants and Nurse Practitioners) who all work together to provide you with the care you need, when you need it.  We recommend signing up for the patient portal called "MyChart".  Sign up information is provided on this After Visit Summary.  MyChart is used to connect with patients for Virtual Visits (Telemedicine).  Patients are able to view lab/test results, encounter notes, upcoming appointments, etc.  Non-urgent messages can be sent to your provider as well.   To learn more about what you can do with MyChart, go to [email or web address].    Your next appointment:   6 month(s)  Provider:   Dr Darryl Nestle    Other Instructions

## 2023-05-12 DIAGNOSIS — Z7901 Long term (current) use of anticoagulants: Secondary | ICD-10-CM | POA: Diagnosis not present

## 2023-05-18 DIAGNOSIS — R2681 Unsteadiness on feet: Secondary | ICD-10-CM | POA: Diagnosis not present

## 2023-05-18 DIAGNOSIS — I5032 Chronic diastolic (congestive) heart failure: Secondary | ICD-10-CM | POA: Diagnosis not present

## 2023-05-20 DIAGNOSIS — I5032 Chronic diastolic (congestive) heart failure: Secondary | ICD-10-CM | POA: Diagnosis not present

## 2023-05-20 DIAGNOSIS — R2681 Unsteadiness on feet: Secondary | ICD-10-CM | POA: Diagnosis not present

## 2023-05-26 DIAGNOSIS — Z7901 Long term (current) use of anticoagulants: Secondary | ICD-10-CM | POA: Diagnosis not present

## 2023-05-28 ENCOUNTER — Encounter: Payer: Self-pay | Admitting: Interventional Cardiology

## 2023-05-29 NOTE — Telephone Encounter (Signed)
Thank you.  We talked about having Dr. Bjorn Pippin see you going forward.  I will forward the recommendation to [facility].

## 2023-06-23 DIAGNOSIS — H43813 Vitreous degeneration, bilateral: Secondary | ICD-10-CM | POA: Diagnosis not present

## 2023-06-23 DIAGNOSIS — Z7901 Long term (current) use of anticoagulants: Secondary | ICD-10-CM | POA: Diagnosis not present

## 2023-06-23 DIAGNOSIS — H353221 Exudative age-related macular degeneration, left eye, with active choroidal neovascularization: Secondary | ICD-10-CM | POA: Diagnosis not present

## 2023-06-23 DIAGNOSIS — Z79899 Other long term (current) drug therapy: Secondary | ICD-10-CM | POA: Diagnosis not present

## 2023-06-23 DIAGNOSIS — H35371 Puckering of macula, right eye: Secondary | ICD-10-CM | POA: Diagnosis not present

## 2023-06-23 DIAGNOSIS — H353112 Nonexudative age-related macular degeneration, right eye, intermediate dry stage: Secondary | ICD-10-CM | POA: Diagnosis not present

## 2023-06-23 DIAGNOSIS — E113293 Type 2 diabetes mellitus with mild nonproliferative diabetic retinopathy without macular edema, bilateral: Secondary | ICD-10-CM | POA: Diagnosis not present

## 2023-07-22 DIAGNOSIS — Z7901 Long term (current) use of anticoagulants: Secondary | ICD-10-CM | POA: Diagnosis not present

## 2023-08-03 DIAGNOSIS — F411 Generalized anxiety disorder: Secondary | ICD-10-CM | POA: Diagnosis not present

## 2023-08-03 DIAGNOSIS — G3184 Mild cognitive impairment, so stated: Secondary | ICD-10-CM | POA: Diagnosis not present

## 2023-08-03 DIAGNOSIS — F5101 Primary insomnia: Secondary | ICD-10-CM | POA: Diagnosis not present

## 2023-08-07 ENCOUNTER — Encounter: Payer: Self-pay | Admitting: Interventional Cardiology

## 2023-08-08 DIAGNOSIS — Z7901 Long term (current) use of anticoagulants: Secondary | ICD-10-CM | POA: Diagnosis not present

## 2023-08-10 NOTE — Telephone Encounter (Addendum)
Patient identification verified by 2 forms. Marilynn Rail, RN    Called and spoke to patients husband Sabino Donovan states:   -patient hard black stool on Friday   -patient has not had anymore black stool over the weekend   -Patient vomited Friday and Sunday, no blood in vomit   -concerned patients INR is not being check enough   -Patient currently stays at [facility]   -before Dr. Eldridge Dace left the plan was for Biweekly INR check   -Patient is supposed to now be seen by Dr. Elesa Massed denies:   -lightheadedness/dizziness  Informed Peyton Najjar message sent to Dr. Bjorn Pippin for input/advisement  Peyton Najjar verbalized understanding, awaiting call for update

## 2023-08-11 ENCOUNTER — Telehealth: Payer: Self-pay | Admitting: Cardiology

## 2023-08-11 DIAGNOSIS — Z7901 Long term (current) use of anticoagulants: Secondary | ICD-10-CM | POA: Diagnosis not present

## 2023-08-11 DIAGNOSIS — I4891 Unspecified atrial fibrillation: Secondary | ICD-10-CM | POA: Diagnosis not present

## 2023-08-11 NOTE — Telephone Encounter (Signed)
Spoke to patient's husband he stated wife is presently at [facility] in [place].Stated he needs to schedule her appointment to establish with Dr.Schumann.She was a former patient of Dr.Varanasi.Stated her INR's are managed there.He was told she has monthly INRs.Dr.Varanasi wanted her to have biweekly INRs.Spoke to patient's RN Berry she verified patient is having INR's done biweekly.Appointment scheduled with Dr.Schumann 10/02/2023 at 8:40 am at [facility] office.Directions given.Advised to bring a list of all medications and INR readings.

## 2023-08-11 NOTE — Telephone Encounter (Signed)
I have not seen this patient before, sounds like previously followed with Dr. Eldridge Dace.  Would recommend she be plugged into Coumadin clinic who can guide [place] on frequency of INR checks, can we arrange follow-up in Coumadin clinic?

## 2023-08-11 NOTE — Telephone Encounter (Signed)
Spoke to Corene Cornea RN at [employer] [facility].She stated she told me wrong.Patient has been getting monthly INR's.Stated husband told her Dr.Varanasi wanted INR to be done twice a month.Advised ok for patient to have INR done twice a month until her appointment with Dr.Schumann 10/02/23 at 8:40 am.She will send a list of INR readings and all medications to appointment.

## 2023-08-11 NOTE — Telephone Encounter (Signed)
[name]  called back has questions about INR. She would like a call back. Please ask for [name] on the [employer]

## 2023-08-19 DIAGNOSIS — R791 Abnormal coagulation profile: Secondary | ICD-10-CM | POA: Diagnosis not present

## 2023-08-21 DIAGNOSIS — Z79899 Other long term (current) drug therapy: Secondary | ICD-10-CM | POA: Diagnosis not present

## 2023-08-24 DIAGNOSIS — Z79899 Other long term (current) drug therapy: Secondary | ICD-10-CM | POA: Diagnosis not present

## 2023-08-24 DIAGNOSIS — Z7901 Long term (current) use of anticoagulants: Secondary | ICD-10-CM | POA: Diagnosis not present

## 2023-08-25 ENCOUNTER — Inpatient Hospital Stay (HOSPITAL_COMMUNITY)
Admission: EM | Admit: 2023-08-25 | Discharge: 2023-09-02 | DRG: 377 | Disposition: A | Discharge status: Skilled Nursing Facility | Payer: Medicare Other | Source: Skilled Nursing Facility | Attending: Internal Medicine | Admitting: Internal Medicine

## 2023-08-25 DIAGNOSIS — R791 Abnormal coagulation profile: Secondary | ICD-10-CM | POA: Diagnosis not present

## 2023-08-25 DIAGNOSIS — K31811 Angiodysplasia of stomach and duodenum with bleeding: Secondary | ICD-10-CM | POA: Diagnosis present

## 2023-08-25 DIAGNOSIS — R413 Other amnesia: Secondary | ICD-10-CM | POA: Diagnosis present

## 2023-08-25 DIAGNOSIS — I10 Essential (primary) hypertension: Secondary | ICD-10-CM | POA: Diagnosis not present

## 2023-08-25 DIAGNOSIS — D62 Acute posthemorrhagic anemia: Secondary | ICD-10-CM | POA: Diagnosis present

## 2023-08-25 DIAGNOSIS — I5033 Acute on chronic diastolic (congestive) heart failure: Secondary | ICD-10-CM | POA: Diagnosis present

## 2023-08-25 DIAGNOSIS — K921 Melena: Secondary | ICD-10-CM | POA: Diagnosis not present

## 2023-08-25 DIAGNOSIS — D6832 Hemorrhagic disorder due to extrinsic circulating anticoagulants: Secondary | ICD-10-CM | POA: Diagnosis present

## 2023-08-25 DIAGNOSIS — F419 Anxiety disorder, unspecified: Secondary | ICD-10-CM | POA: Diagnosis not present

## 2023-08-25 DIAGNOSIS — I359 Nonrheumatic aortic valve disorder, unspecified: Secondary | ICD-10-CM | POA: Diagnosis not present

## 2023-08-25 DIAGNOSIS — L853 Xerosis cutis: Secondary | ICD-10-CM | POA: Diagnosis not present

## 2023-08-25 DIAGNOSIS — I130 Hypertensive heart and chronic kidney disease with heart failure and stage 1 through stage 4 chronic kidney disease, or unspecified chronic kidney disease: Secondary | ICD-10-CM | POA: Diagnosis present

## 2023-08-25 DIAGNOSIS — Z7401 Bed confinement status: Secondary | ICD-10-CM | POA: Diagnosis not present

## 2023-08-25 DIAGNOSIS — T45515A Adverse effect of anticoagulants, initial encounter: Secondary | ICD-10-CM | POA: Diagnosis not present

## 2023-08-25 DIAGNOSIS — K625 Hemorrhage of anus and rectum: Secondary | ICD-10-CM | POA: Diagnosis not present

## 2023-08-25 DIAGNOSIS — Z8619 Personal history of other infectious and parasitic diseases: Secondary | ICD-10-CM

## 2023-08-25 DIAGNOSIS — E785 Hyperlipidemia, unspecified: Secondary | ICD-10-CM | POA: Diagnosis present

## 2023-08-25 DIAGNOSIS — Z8711 Personal history of peptic ulcer disease: Secondary | ICD-10-CM

## 2023-08-25 DIAGNOSIS — J90 Pleural effusion, not elsewhere classified: Secondary | ICD-10-CM | POA: Diagnosis not present

## 2023-08-25 DIAGNOSIS — K31819 Angiodysplasia of stomach and duodenum without bleeding: Secondary | ICD-10-CM

## 2023-08-25 DIAGNOSIS — Z951 Presence of aortocoronary bypass graft: Secondary | ICD-10-CM | POA: Diagnosis not present

## 2023-08-25 DIAGNOSIS — I2510 Atherosclerotic heart disease of native coronary artery without angina pectoris: Secondary | ICD-10-CM | POA: Diagnosis present

## 2023-08-25 DIAGNOSIS — Z952 Presence of prosthetic heart valve: Secondary | ICD-10-CM

## 2023-08-25 DIAGNOSIS — I480 Paroxysmal atrial fibrillation: Secondary | ICD-10-CM | POA: Diagnosis present

## 2023-08-25 DIAGNOSIS — J209 Acute bronchitis, unspecified: Secondary | ICD-10-CM | POA: Diagnosis present

## 2023-08-25 DIAGNOSIS — R7401 Elevation of levels of liver transaminase levels: Secondary | ICD-10-CM | POA: Diagnosis not present

## 2023-08-25 DIAGNOSIS — K922 Gastrointestinal hemorrhage, unspecified: Secondary | ICD-10-CM | POA: Diagnosis not present

## 2023-08-25 DIAGNOSIS — R7989 Other specified abnormal findings of blood chemistry: Secondary | ICD-10-CM | POA: Diagnosis not present

## 2023-08-25 DIAGNOSIS — I5032 Chronic diastolic (congestive) heart failure: Secondary | ICD-10-CM | POA: Diagnosis not present

## 2023-08-25 DIAGNOSIS — R079 Chest pain, unspecified: Secondary | ICD-10-CM | POA: Diagnosis not present

## 2023-08-25 DIAGNOSIS — E039 Hypothyroidism, unspecified: Secondary | ICD-10-CM | POA: Diagnosis not present

## 2023-08-25 DIAGNOSIS — I5021 Acute systolic (congestive) heart failure: Secondary | ICD-10-CM | POA: Diagnosis not present

## 2023-08-25 DIAGNOSIS — R748 Abnormal levels of other serum enzymes: Secondary | ICD-10-CM | POA: Diagnosis not present

## 2023-08-25 DIAGNOSIS — N179 Acute kidney failure, unspecified: Secondary | ICD-10-CM | POA: Diagnosis not present

## 2023-08-25 DIAGNOSIS — Z79899 Other long term (current) drug therapy: Secondary | ICD-10-CM | POA: Diagnosis not present

## 2023-08-25 DIAGNOSIS — Z8249 Family history of ischemic heart disease and other diseases of the circulatory system: Secondary | ICD-10-CM

## 2023-08-25 DIAGNOSIS — J9811 Atelectasis: Secondary | ICD-10-CM | POA: Diagnosis not present

## 2023-08-25 DIAGNOSIS — K319 Disease of stomach and duodenum, unspecified: Secondary | ICD-10-CM | POA: Diagnosis not present

## 2023-08-25 DIAGNOSIS — K5900 Constipation, unspecified: Secondary | ICD-10-CM | POA: Diagnosis not present

## 2023-08-25 DIAGNOSIS — Z955 Presence of coronary angioplasty implant and graft: Secondary | ICD-10-CM | POA: Diagnosis not present

## 2023-08-25 DIAGNOSIS — R0989 Other specified symptoms and signs involving the circulatory and respiratory systems: Secondary | ICD-10-CM | POA: Diagnosis not present

## 2023-08-25 DIAGNOSIS — I252 Old myocardial infarction: Secondary | ICD-10-CM

## 2023-08-25 DIAGNOSIS — Q2733 Arteriovenous malformation of digestive system vessel: Secondary | ICD-10-CM | POA: Diagnosis not present

## 2023-08-25 DIAGNOSIS — Z7901 Long term (current) use of anticoagulants: Secondary | ICD-10-CM | POA: Diagnosis not present

## 2023-08-25 DIAGNOSIS — E611 Iron deficiency: Secondary | ICD-10-CM | POA: Diagnosis not present

## 2023-08-25 DIAGNOSIS — E1122 Type 2 diabetes mellitus with diabetic chronic kidney disease: Secondary | ICD-10-CM | POA: Diagnosis present

## 2023-08-25 DIAGNOSIS — D649 Anemia, unspecified: Secondary | ICD-10-CM | POA: Diagnosis not present

## 2023-08-25 DIAGNOSIS — R69 Illness, unspecified: Secondary | ICD-10-CM | POA: Diagnosis not present

## 2023-08-25 DIAGNOSIS — Z8616 Personal history of COVID-19: Secondary | ICD-10-CM | POA: Diagnosis not present

## 2023-08-25 DIAGNOSIS — R0602 Shortness of breath: Secondary | ICD-10-CM | POA: Diagnosis not present

## 2023-08-25 DIAGNOSIS — R609 Edema, unspecified: Secondary | ICD-10-CM

## 2023-08-25 DIAGNOSIS — R059 Cough, unspecified: Secondary | ICD-10-CM | POA: Diagnosis not present

## 2023-08-25 DIAGNOSIS — R932 Abnormal findings on diagnostic imaging of liver and biliary tract: Secondary | ICD-10-CM | POA: Diagnosis not present

## 2023-08-25 DIAGNOSIS — N1832 Chronic kidney disease, stage 3b: Secondary | ICD-10-CM | POA: Diagnosis present

## 2023-08-25 DIAGNOSIS — K8020 Calculus of gallbladder without cholecystitis without obstruction: Secondary | ICD-10-CM | POA: Diagnosis present

## 2023-08-25 DIAGNOSIS — I517 Cardiomegaly: Secondary | ICD-10-CM | POA: Diagnosis not present

## 2023-08-25 DIAGNOSIS — Z79890 Hormone replacement therapy (postmenopausal): Secondary | ICD-10-CM

## 2023-08-25 DIAGNOSIS — I499 Cardiac arrhythmia, unspecified: Secondary | ICD-10-CM | POA: Diagnosis not present

## 2023-08-25 DIAGNOSIS — R600 Localized edema: Secondary | ICD-10-CM | POA: Diagnosis not present

## 2023-08-25 DIAGNOSIS — N184 Chronic kidney disease, stage 4 (severe): Secondary | ICD-10-CM | POA: Diagnosis not present

## 2023-08-25 LAB — COMPREHENSIVE METABOLIC PANEL
ALT: 51 U/L — ABNORMAL HIGH (ref 0–44)
AST: 60 U/L — ABNORMAL HIGH (ref 15–41)
Albumin: 3.1 g/dL — ABNORMAL LOW (ref 3.5–5.0)
Alkaline Phosphatase: 64 U/L (ref 38–126)
Anion gap: 10 (ref 5–15)
BUN: 68 mg/dL — ABNORMAL HIGH (ref 8–23)
CO2: 19 mmol/L — ABNORMAL LOW (ref 22–32)
Calcium: 8.8 mg/dL — ABNORMAL LOW (ref 8.9–10.3)
Chloride: 106 mmol/L (ref 98–111)
Creatinine, Ser: 2.77 mg/dL — ABNORMAL HIGH (ref 0.44–1.00)
GFR, Estimated: 16 mL/min — ABNORMAL LOW (ref >60)
Glucose, Bld: 117 mg/dL — ABNORMAL HIGH (ref 70–99)
Potassium: 4.5 mmol/L (ref 3.5–5.1)
Sodium: 135 mmol/L (ref 135–145)
Total Bilirubin: 0.7 mg/dL (ref <1.2)
Total Protein: 5.9 g/dL — ABNORMAL LOW (ref 6.5–8.1)

## 2023-08-25 LAB — POC OCCULT BLOOD, ED: Fecal Occult Bld: POSITIVE — AB

## 2023-08-25 LAB — PREPARE RBC (CROSSMATCH)

## 2023-08-25 LAB — APTT: aPTT: 42 s — ABNORMAL HIGH (ref 24–36)

## 2023-08-25 LAB — CBC
HCT: 21.7 % — ABNORMAL LOW (ref 36.0–46.0)
Hemoglobin: 6.3 g/dL — CL (ref 12.0–15.0)
MCH: 29.7 pg (ref 26.0–34.0)
MCHC: 29 g/dL — ABNORMAL LOW (ref 30.0–36.0)
MCV: 102.4 fL — ABNORMAL HIGH (ref 80.0–100.0)
Platelets: 199 10*3/uL (ref 150–400)
RBC: 2.12 MIL/uL — ABNORMAL LOW (ref 3.87–5.11)
RDW: 18.6 % — ABNORMAL HIGH (ref 11.5–15.5)
WBC: 4.7 10*3/uL (ref 4.0–10.5)
nRBC: 0 % (ref 0.0–0.2)

## 2023-08-25 LAB — PROTIME-INR
INR: 3.4 — ABNORMAL HIGH (ref 0.8–1.2)
Prothrombin Time: 34.2 s — ABNORMAL HIGH (ref 11.4–15.2)

## 2023-08-25 MED ORDER — SODIUM CHLORIDE 0.9 % IV SOLN
2.0000 g | INTRAVENOUS | Status: AC
  Administered 2023-08-25 – 2023-08-31 (×7): 2 g via INTRAVENOUS
  Filled 2023-08-25 (×7): qty 20

## 2023-08-25 MED ORDER — CLONAZEPAM 0.5 MG PO TABS
0.5000 mg | ORAL_TABLET | Freq: Two times a day (BID) | ORAL | Status: DC | PRN
  Administered 2023-08-26 – 2023-09-01 (×5): 0.5 mg via ORAL
  Filled 2023-08-25 (×5): qty 1

## 2023-08-25 MED ORDER — SODIUM CHLORIDE 0.9% IV SOLUTION: Freq: Once | INTRAVENOUS | Status: AC

## 2023-08-25 MED ORDER — ATORVASTATIN CALCIUM 40 MG PO TABS
40.0000 mg | ORAL_TABLET | Freq: Every day | ORAL | Status: DC
  Administered 2023-08-27 – 2023-09-02 (×7): 40 mg via ORAL
  Filled 2023-08-25 (×3): qty 1
  Filled 2023-08-25: qty 4
  Filled 2023-08-25 (×3): qty 1

## 2023-08-25 MED ORDER — ACETAMINOPHEN 325 MG PO TABS: 650.0000 mg | ORAL_TABLET | Freq: Four times a day (QID) | ORAL | Status: DC | PRN

## 2023-08-25 MED ORDER — SODIUM CHLORIDE 0.9 % IV SOLN
50.0000 ug/h | INTRAVENOUS | Status: DC
  Administered 2023-08-25: 50 ug/h via INTRAVENOUS
  Filled 2023-08-25: qty 1

## 2023-08-25 MED ORDER — OCTREOTIDE LOAD VIA INFUSION
50.0000 ug | Freq: Once | INTRAVENOUS | Status: AC
  Administered 2023-08-25: 50 ug via INTRAVENOUS
  Filled 2023-08-25: qty 25

## 2023-08-25 MED ORDER — SODIUM CHLORIDE 0.9 % IV BOLUS: 1000.0000 mL | Freq: Once | INTRAVENOUS | Status: DC

## 2023-08-25 MED ORDER — PANTOPRAZOLE SODIUM 40 MG IV SOLR
40.0000 mg | Freq: Two times a day (BID) | INTRAVENOUS | Status: DC
  Administered 2023-08-26 (×2): 40 mg via INTRAVENOUS
  Filled 2023-08-25 (×3): qty 10

## 2023-08-25 MED ORDER — SODIUM CHLORIDE 0.9% FLUSH
3.0000 mL | Freq: Two times a day (BID) | INTRAVENOUS | Status: DC
  Administered 2023-08-26 – 2023-09-02 (×13): 3 mL via INTRAVENOUS

## 2023-08-25 MED ORDER — POLYETHYLENE GLYCOL 3350 17 G PO PACK
17.0000 g | PACK | Freq: Every day | ORAL | Status: DC | PRN
  Administered 2023-08-30: 17 g via ORAL
  Filled 2023-08-25: qty 1

## 2023-08-25 MED ORDER — KCL-LACTATED RINGERS-D5W 20 MEQ/L IV SOLN
INTRAVENOUS | Status: DC
  Filled 2023-08-25: qty 1000

## 2023-08-25 MED ORDER — ACETAMINOPHEN 650 MG RE SUPP: 650.0000 mg | Freq: Four times a day (QID) | RECTAL | Status: DC | PRN

## 2023-08-25 MED ORDER — PHYTONADIONE 5 MG PO TABS
5.0000 mg | ORAL_TABLET | ORAL | Status: AC
  Administered 2023-08-25: 5 mg via ORAL
  Filled 2023-08-25: qty 1

## 2023-08-25 MED ORDER — PANTOPRAZOLE SODIUM 40 MG IV SOLR
40.0000 mg | Freq: Once | INTRAVENOUS | Status: AC
  Administered 2023-08-25: 40 mg via INTRAVENOUS
  Filled 2023-08-25: qty 10

## 2023-08-25 MED ORDER — EZETIMIBE 10 MG PO TABS
10.0000 mg | ORAL_TABLET | Freq: Every day | ORAL | Status: DC
  Administered 2023-08-27 – 2023-09-02 (×7): 10 mg via ORAL
  Filled 2023-08-25 (×7): qty 1

## 2023-08-25 MED ORDER — FUROSEMIDE 20 MG PO TABS
40.0000 mg | ORAL_TABLET | Freq: Every day | ORAL | Status: DC
Start: 2023-08-26 — End: 2023-08-26

## 2023-08-25 MED ORDER — LEVOTHYROXINE SODIUM 88 MCG PO TABS
88.0000 ug | ORAL_TABLET | Freq: Every day | ORAL | Status: DC
  Administered 2023-08-26 – 2023-09-02 (×8): 88 ug via ORAL
  Filled 2023-08-25 (×8): qty 1

## 2023-08-25 NOTE — H&P (Signed)
History and Physical    Patient: [name]:[id number] DOB: 1933-06-20 DOA: 08/25/2023 DOS: the patient was seen and examined on 08/25/2023 PCP: [facility]  Patient coming from:  [facility].  Chief Complaint:  Chief Complaint  Patient presents with   Hgb 5.9   HPI: [name] is a 89 y.o. female with medical history significant of "short term memory loss" per the husband, progresing for 6 months.  History obtained from patient over the phone  [facility]  Patient's husband noted that he she was having black stools for the last couple of days and requested that the patient's blood be tested, which revealed severe anemia.  Patient is sent to [facility], ER.  Patient does not have any history as far as we can tell if reporting abdominal pain fever vomiting.  No reported history of any gum bleeding nosebleeding skin bruising.  Patient's low blood count is confirmed here in the ER.  Evaluation in the ER is further notable for melena on exam by the ER provider.  Medical evaluation is sought.  Unfortunately it is clear to me that the patient cannot recall events of the day.  Patient is not reporting any symptoms at this time Review of Systems: unable to review all systems due to the inability of the patient to answer questions. Past Medical History:  Diagnosis Date   Anemia    Anginal pain (HCC)    Arteriovenous malformation of gastrointestinal tract    Arthritis    "fingers mostly" (04/12/2015)   Carotid artery disease (HCC)    Carotid US 3/17:  60-79% RICA; 40-59% LICA; Elevated bilateral subclavian artery velocities >>f/u 1 year. // Carotid US 4/18: R 40-59; L 1-39 >> FU 1 year // Carotid US 10/2018: R 40-59; L 1-39, L subclavian stenosis    CHF (congestive heart failure) (HCC)    Chronic kidney disease (CKD), stage III (moderate) (HCC)    Chronic lower GI bleeding    "today; last time was ~ 8 yr ago; used  to have them often before that too" (02/11/2013)   Coronary artery disease    DJD (degenerative joint disease)    Dysrhythmia    Heart murmur    History of blood transfusion    "a few times over the years; usually related to my Coumadin" (04/12/2015   History of gout    Hyperlipemia    Hypertension    Macula lutea degeneration    Old MI (myocardial infarction)    "a coulple /dr in 02/2008; I never even knew I'd had them" (04/12/2015)   Type II diabetes mellitus (HCC)    no medications, diet controlled   Past Surgical History:  Procedure Laterality Date   AORTIC VALVE REPLACEMENT  09/23/1991   St. Jude   APPENDECTOMY  09/23/1951   BIOPSY  11/06/2019   Procedure: BIOPSY;  Surgeon: Sherrilyn Rist, MD;  Location: [facility] ENDOSCOPY;  Service: Gastroenterology;;   CARDIAC CATHETERIZATION  ~1990   CARDIAC CATHETERIZATION  04/12/2015   Procedure: Coronary Stent Intervention;  Surgeon: Corky Crafts, MD;    SYNERGY DES 3.5X16 to the ostial RCA    CARDIAC CATHETERIZATION  04/12/2015   Procedure: Coronary/Graft Angiography;  Surgeon: Oren Binet, MD; LAD & CFX 100%, patent LIMA-LAD, SVG-D1; SVG-OM-PDA first limb 100%, 2nd limb patent; oRCA 75%>0 w/ stent   CARDIAC VALVE REPLACEMENT  09/22/1992   St. Jude/notes 10/29/2003 (02/11/2013)   CARDIOVERSION  N/A 08/15/2019   Procedure: CARDIOVERSION;  Surgeon: Elease Hashimoto Deloris Ping, MD;  Location: [facility] ENDOSCOPY;  Service: Cardiovascular;  Laterality: N/A;   COLONOSCOPY N/A 02/13/2013   Procedure: COLONOSCOPY;  Surgeon: Hart Carwin, MD;  Location: [facility] ENDOSCOPY;  Service: Endoscopy;  Laterality: N/A;   CORONARY ANGIOPLASTY WITH STENT PLACEMENT  2009+   "3 at least; put in 1 stent at a time" (02/11/2013)   CORONARY ARTERY BYPASS GRAFT  09/22/1988   LIMA-LAD, SVG-OM-PDA, SVG-D1   DILATION AND CURETTAGE OF UTERUS  05/24/1959   'after a miscarriage" (02/11/2013)   ENTEROSCOPY N/A 02/13/2013   Procedure: ENTEROSCOPY;  Surgeon: Hart Carwin, MD;   Location: [facility] ENDOSCOPY;  Service: Endoscopy;  Laterality: N/A;   ENTEROSCOPY N/A 11/06/2019   Procedure: ENTEROSCOPY;  Surgeon: Sherrilyn Rist, MD;  Location: [facility] ENDOSCOPY;  Service: Gastroenterology;  Laterality: N/A;   ESOPHAGOGASTRODUODENOSCOPY (EGD) WITH PROPOFOL N/A 12/01/2019   Procedure: ESOPHAGOGASTRODUODENOSCOPY (EGD) WITH PROPOFOL;  Surgeon: Napoleon Form, MD;  Location: [facility] ENDOSCOPY;  Service: Endoscopy;  Laterality: N/A;   EXPLORATORY LAPAROTOMY  02/24/2008   which revealed a retroperitoneal hematoma and bleeding from the right external iliac artery/notes 03/02/2008 (02/11/2013)    FLEXIBLE SIGMOIDOSCOPY N/A 04/21/2015   Procedure: FLEXIBLE SIGMOIDOSCOPY;  Surgeon: Meryl Dare, MD;  Location: [facility] ENDOSCOPY;  Service: Endoscopy;  Laterality: N/A;   HOT HEMOSTASIS N/A 11/06/2019   Procedure: HOT HEMOSTASIS (ARGON PLASMA COAGULATION/BICAP);  Surgeon: Sherrilyn Rist, MD;  Location: [facility] ENDOSCOPY;  Service: Gastroenterology;  Laterality: N/A;   LUMBAR LAMINECTOMY/DECOMPRESSION MICRODISCECTOMY Left 06/02/2022   Procedure: LEFT LUMBAR FOUR-FIVE MICRODISCECTOMY;  Surgeon: Tressie Stalker, MD;  Location: [facility] OR;  Service: Neurosurgery;  Laterality: Left;  3C   TEE WITHOUT CARDIOVERSION N/A 12/01/2018   Procedure: TRANSESOPHAGEAL ECHOCARDIOGRAM (TEE);  Surgeon: Jake Bathe, MD;  Location: [facility] ENDOSCOPY;  Service: Cardiovascular;  Laterality: N/A;   Social History:  reports that she has never smoked. She has never used smokeless tobacco. She reports that she does not drink alcohol and does not use drugs.  Allergies  Allergen Reactions   Capzasin [Capsaicin] Other (See Comments)    CAUSED SKIN BURNS THAT LASTED FOR 30 DAYS   Darvon [Propoxyphene] Nausea And Vomiting   Lisinopril Cough   Septra [Sulfamethoxazole-Trimethoprim] Other (See Comments)    Increased INR   Voltaren [Diclofenac] Other (See Comments)    POSSIBLE CHANCE OF INTERNAL BLEEDING MAY RESULT   Warfarin And  Related Other (See Comments)    ONLY TOLERATES BRAND   Tobrex [Tobramycin] Other (See Comments)    "Allergic" to this- exact reaction not cited   Trazodone And Nefazodone Other (See Comments)    Severe sweating   Zolpidem Tartrate Other (See Comments)    Hallucinations   Penicillins Rash    Did it involve swelling of the face/tongue/throat, SOB, or low BP? No Did it involve sudden or severe rash/hives, skin peeling, or any reaction on the inside of your mouth or nose? No Did you need to seek medical attention at a hospital or doctor's office? No When did it last happen?  15 years     If all above answers are "NO", may proceed with cephalosporin use.    Family History  Problem Relation Age of Onset   Heart disease Mother    Heart disease Father     Prior to Admission medications   Medication Sig Start Date End Date Taking? Authorizing Provider  amiodarone (PACERONE) 200 MG tablet Take 1 tablet by mouth  once daily Patient taking differently: Take 200 mg by mouth in the morning. 03/10/22   Corky Crafts, MD  atorvastatin (LIPITOR) 40 MG tablet Take 1 tablet (40 mg total) by mouth daily. 10/31/22   Corky Crafts, MD  Bevacizumab (AVASTIN IV) Inject 1 Dose into the vein every 8 (eight) weeks.    [provider]  clindamycin (CLEOCIN) 300 MG capsule Take 300 mg by mouth See admin instructions. Take as directed to pre-treat for dental appointments    [provider]  ezetimibe (ZETIA) 10 MG tablet Take 1 tablet by mouth once daily Patient taking differently: Take 10 mg by mouth in the morning. 02/03/22   Corky Crafts, MD  FeFum-FePo-FA-B Cmp-C-Zn-Mn-Cu (SE-TAN PLUS) 162-115.2-1 MG CAPS Take 1 capsule by mouth 2 (two) times daily. 05/22/15   [provider]  folic acid (FOLVITE) 400 MCG tablet Take 400 mcg by mouth in the morning.    [provider]  furosemide (LASIX) 40 MG tablet Take 40 -80 mg by mouth daily based on renal function  10/31/22   Corky Crafts, MD  isosorbide mononitrate (IMDUR) 60 MG 24 hr tablet Take 1 tablet by mouth once daily Patient taking differently: Take 60 mg by mouth in the morning. 05/07/22   Corky Crafts, MD  losartan (COZAAR) 50 MG tablet Take 50 mg by mouth in the morning.    [provider]  Multiple Vitamin (MULTIVITAMIN WITH MINERALS) TABS Take 1 tablet by mouth daily with breakfast.    [provider]  Multiple Vitamins-Minerals (PRESERVISION AREDS 2 PO) Take 1 capsule by mouth 2 (two) times daily.    [provider]  nitroGLYCERIN (NITROSTAT) 0.4 MG SL tablet DISSOLVE ONE TABLET UNDER THE TONGUE EVERY 5 MINUTES AS NEEDED FOR CHEST PAIN.  DO NOT EXCEED A TOTAL OF 3 DOSES IN 15 MINUTES Patient taking differently: Place 0.4 mg under the tongue every 5 (five) minutes x 3 doses as needed for chest pain. 02/14/20   Corky Crafts, MD  Omega-3 Fatty Acids (FISH OIL) 600 MG CAPS Take 1,200 mg by mouth 2 (two) times daily.    [provider]  oxyCODONE (OXY IR/ROXICODONE) 5 MG immediate release tablet Take 5 mg by mouth every 6 (six) hours as needed for moderate pain or severe pain.    [provider]  TYLENOL 500 MG tablet Take 500-1,000 mg by mouth every 6 (six) hours as needed for mild pain or headache.    [provider]  warfarin (JANTOVEN) 2.5 MG tablet TAKE 1/2 TABLET TO 1 TABLET BY MOUTH DAILY AS DIRECTED BY COUMADIN CLINIC Patient taking differently: Take 1.25-2.5 mg by mouth See admin instructions. Take 1.25 mg by mouth in the evening on Sun/Mon/Wed/Fri/Sat and 2.5 mg on Tues/Thurs 10/03/22   Corky Crafts, MD    Physical Exam: Vitals:   08/25/23 1845 08/25/23 1900 08/25/23 1937 08/25/23 2000  BP:   (!) 122/50 (!) 120/42  Pulse:   67 74  Resp: 20  16 16   Temp:  98.3 F (36.8 C)    TempSrc:  Oral    SpO2:   100% 100%   General: Patient is alert and awake, does not appear to be distressed.  Talks appropriately.   However clearly cannot remember events of the day. Respiratory; bilateral intravesicular Cardiovascular exam S1-S2 normal Abdomen all quadrants are soft nontender bowel sounds are hyperactive Rectal exam was performed by the ER provider and noted concerning for melena. Extremities warm,  right leg is markedly bigger than the left with 1+ swelling. Still function is intact Data Reviewed:  Labs on Admission:  Results for orders placed or performed during the hospital encounter of 08/25/23 (from the past 24 hour(s))  POC occult blood, ED     Status: Abnormal   Collection Time: 08/25/23  5:46 PM  Result Value Ref Range   Fecal Occult Bld POSITIVE (A) NEGATIVE  CBC     Status: Abnormal   Collection Time: 08/25/23  6:59 PM  Result Value Ref Range   WBC 4.7 4.0 - 10.5 K/uL   RBC 2.12 (L) 3.87 - 5.11 MIL/uL   Hemoglobin 6.3 (LL) 12.0 - 15.0 g/dL   HCT 08.6 (L) 57.8 - 46.9 %   MCV 102.4 (H) 80.0 - 100.0 fL   MCH 29.7 26.0 - 34.0 pg   MCHC 29.0 (L) 30.0 - 36.0 g/dL   RDW 62.9 (H) 52.8 - 41.3 %   Platelets 199 150 - 400 K/uL   nRBC 0.0 0.0 - 0.2 %  Comprehensive metabolic panel     Status: Abnormal   Collection Time: 08/25/23  6:59 PM  Result Value Ref Range   Sodium 135 135 - 145 mmol/L   Potassium 4.5 3.5 - 5.1 mmol/L   Chloride 106 98 - 111 mmol/L   CO2 19 (L) 22 - 32 mmol/L   Glucose, Bld 117 (H) 70 - 99 mg/dL   BUN 68 (H) 8 - 23 mg/dL   Creatinine, Ser 2.44 (H) 0.44 - 1.00 mg/dL   Calcium 8.8 (L) 8.9 - 10.3 mg/dL   Total Protein 5.9 (L) 6.5 - 8.1 g/dL   Albumin 3.1 (L) 3.5 - 5.0 g/dL   AST 60 (H) 15 - 41 U/L   ALT 51 (H) 0 - 44 U/L   Alkaline Phosphatase 64 38 - 126 U/L   Total Bilirubin 0.7 <1.2 mg/dL   GFR, Estimated 16 (L) >60 mL/min   Anion gap 10 5 - 15  Type and screen [facility]     Status: None (Preliminary result)   Collection Time: 08/25/23  6:59 PM  Result Value Ref Range   ABO/RH(D) O NEG    Antibody Screen NEG    Sample Expiration  08/28/2023,2359    Unit Number [id number]    Blood Component Type RED CELLS,LR    Unit division 00    Status of Unit ISSUED    Transfusion Status OK TO TRANSFUSE    Crossmatch Result      Compatible Performed at [facility], [address], [place], [place] [address]   Protime-INR     Status: Abnormal   Collection Time: 08/25/23  6:59 PM  Result Value Ref Range   Prothrombin Time 34.2 (H) 11.4 - 15.2 seconds   INR 3.4 (H) 0.8 - 1.2  APTT     Status: Abnormal   Collection Time: 08/25/23  6:59 PM  Result Value Ref Range   aPTT 42 (H) 24 - 36 seconds  Prepare RBC (crossmatch)     Status: None   Collection Time: 08/25/23  7:55 PM  Result Value Ref Range   Order Confirmation      ORDER PROCESSED BY BLOOD BANK Performed at [facility], [address], [place], [place] [address]    Basic Metabolic Panel: Recent Labs  Lab 08/25/23 1859  NA 135  K 4.5  CL 106  CO2 19*  GLUCOSE 117*  BUN 68*  CREATININE 2.77*  CALCIUM 8.8*   Liver Function Tests: Recent Labs  Lab 08/25/23 1859  AST 60*  ALT 51*  ALKPHOS 64  BILITOT 0.7  PROT 5.9*  ALBUMIN 3.1*   No results for input(s): "LIPASE", "AMYLASE" in the last 168 hours. No results for input(s): "AMMONIA" in the last 168 hours. CBC: Recent Labs  Lab 08/25/23 1859  WBC 4.7  HGB 6.3*  HCT 21.7*  MCV 102.4*  PLT 199   Cardiac Enzymes: No results for input(s): "CKTOTAL", "CKMB", "CKMBINDEX", "TROPONINIHS" in the last 168 hours.  BNP (last 3 results) No results for input(s): "PROBNP" in the last 8760 hours. CBG: No results for input(s): "GLUCAP" in the last 168 hours.  Radiological Exams on Admission:  No results found.  EKG: Independently reviewed. afib   Assessment and Plan: * Gastrointestinal hemorrhage Has had multiple upper GI bleeds in the past.  Patient seems to be having another upper GI bleed at this time associated with severe anemia code, no hypotension.  Patient's warfarin for her  paroxysmal atrial fibrillation, chronic as well as aortic valve mechanical will have to be held at this time.  I will give 2 units of FFP's.  Patient has been ordered for 1 unit of PRBC.  Given patient's current hemoglobin of 6.3 and a target of over 8 she will need another unit of PRBC which I have ordered.  Patient has been ordered for pantoprazole 40 IV twice daily.  She will receive 80 mg total today.  I will also order slow standing IV hydration. Patient LFT are chornically appearing abnormal. I will roder octreotide + ceftriaxone.. Patiet has AKI. Likely due to above. Patient received 1 liter bolus in Er. Trend.  GI eval being called by ER provider . In case of change of Abd exam, fever or leukocytosis, will consider abd imaging.  Swelling Patient has asymmetric leg swelling right more than left.  This is coroborated by CT lower extrmity on 10/05/2022. Monitor clinically.  Amnesia Per husband, ongoign for 6 months. Check B12, TSH. Will need routine head imaging once vitaly stabilised.  H/O mechanical aortic valve replacement I am not able to find details of the exact type of mechanical valve.  Regarding patient's hemodynamically destabilizing GI bleed note diastolic blood pressure in the 40 at presentation.  I think patient will need reversal of INR as above.  Patient further has history of A-fib.   Patinet home med rec pending pharmacy input - please review at that time.  I have reviewed the documentation obtained from patient's nursing home.  Continue with atorvastatin, ezetimibe.  Plan to hold Imdur tonight.  Plan to hold warfarin.  Plan continue with Synthroid 88 mcg daily.  Hold losartan given AKI. C.w. clonazepam for anxeity. C.w. lasix, even wth fludis, starting tomorrow.   Advance Care Planning:   Code Status: Prior full code per husband  Consults: GI being engaged by ER provider.  Family Communication: discussed with husband over the phone.  Severity of Illness: The appropriate  patient status for this patient is INPATIENT. Inpatient status is judged to be reasonable and necessary in order to provide the required intensity of service to ensure the patient's safety. The patient's presenting symptoms, physical exam findings, and initial radiographic and laboratory data in the context of their chronic comorbidities is felt to place them at high risk for further clinical deterioration. Furthermore, it is not anticipated that the patient will be medically stable for discharge from the hospital within 2 midnights of admission.   *  I certify that at the point of admission it is my clinical judgment that the patient will require inpatient hospital care spanning beyond 2 midnights from the point of admission due to high intensity of service, high risk for further deterioration and high frequency of surveillance required.*  Author: Nolberto Hanlon, MD 08/25/2023 8:58 PM  For on call review [email or web address].

## 2023-08-25 NOTE — Assessment & Plan Note (Signed)
I am not able to find details of the exact type of mechanical valve.  Regarding patient's hemodynamically destabilizing GI bleed note diastolic blood pressure in the 40 at presentation.  I think patient will need reversal of INR as above.  Patient further has history of A-fib.

## 2023-08-25 NOTE — Assessment & Plan Note (Addendum)
Patient has asymmetric leg swelling right more than left.  This is coroborated by CT lower extrmity on 10/05/2022. Monitor clinically.

## 2023-08-25 NOTE — Assessment & Plan Note (Signed)
Per husband, ongoign for 6 months. Check B12, TSH. Will need routine head imaging once vitaly stabilised.

## 2023-08-25 NOTE — ED Provider Notes (Signed)
[place] EMERGENCY DEPARTMENT AT [facility] Provider Note   CSN: [id number] Arrival date & time: 08/25/23  1706     History  Chief Complaint  Patient presents with   Hgb 5.9    [name] is a 89 y.o. female past medical history seen for CKD stage IV, chronic anticoagulation on warfarin, anemia, type 2 diabetes, hypertension, carotid stenosis, hyperlipidemia, paroxysmal A-fib, CHF, history of GI bleed who presents with concern from her facility for a hemoglobin of 5.9.  Patient was admitted in January of this year needing a blood transfusion.  Her facility endorse some rectal bleeding, patient is unsure if she has had any change to her stool or rectal bleeding.  She reports that she has had increased tiredness and fatigue over the last several days to weeks.  She denies any chest pain, shortness of breath.  HPI     Home Medications Prior to Admission medications   Medication Sig Start Date End Date Taking? Authorizing Provider  amiodarone (PACERONE) 200 MG tablet Take 1 tablet by mouth once daily Patient taking differently: Take 200 mg by mouth in the morning. 03/10/22   Corky Crafts, MD  atorvastatin (LIPITOR) 40 MG tablet Take 1 tablet (40 mg total) by mouth daily. 10/31/22   Corky Crafts, MD  Bevacizumab (AVASTIN IV) Inject 1 Dose into the vein every 8 (eight) weeks.    [provider]  clindamycin (CLEOCIN) 300 MG capsule Take 300 mg by mouth See admin instructions. Take as directed to pre-treat for dental appointments    [provider]  ezetimibe (ZETIA) 10 MG tablet Take 1 tablet by mouth once daily Patient taking differently: Take 10 mg by mouth in the morning. 02/03/22   Corky Crafts, MD  FeFum-FePo-FA-B Cmp-C-Zn-Mn-Cu (SE-TAN PLUS) 162-115.2-1 MG CAPS Take 1 capsule by mouth 2 (two) times daily. 05/22/15   [provider]  folic acid (FOLVITE) 400 MCG tablet Take 400 mcg by mouth in the morning.    [provider]  furosemide (LASIX) 40 MG tablet Take 40 -80 mg by mouth daily based on renal function 10/31/22   Corky Crafts, MD  isosorbide mononitrate (IMDUR) 60 MG 24 hr tablet Take 1 tablet by mouth once daily Patient taking differently: Take 60 mg by mouth in the morning. 05/07/22   Corky Crafts, MD  losartan (COZAAR) 50 MG tablet Take 50 mg by mouth in the morning.    [provider]  Multiple Vitamin (MULTIVITAMIN WITH MINERALS) TABS Take 1 tablet by mouth daily with breakfast.    [provider]  Multiple Vitamins-Minerals (PRESERVISION AREDS 2 PO) Take 1 capsule by mouth 2 (two) times daily.    [provider]  nitroGLYCERIN (NITROSTAT) 0.4 MG SL tablet DISSOLVE ONE TABLET UNDER THE TONGUE EVERY 5 MINUTES AS NEEDED FOR CHEST PAIN.  DO NOT EXCEED A TOTAL OF 3 DOSES IN 15 MINUTES Patient taking differently: Place 0.4 mg under the tongue every 5 (five) minutes x 3 doses as needed for chest pain. 02/14/20   Corky Crafts, MD  Omega-3 Fatty Acids (FISH OIL) 600 MG CAPS Take 1,200 mg by mouth 2 (two) times daily.    [provider]  oxyCODONE (OXY IR/ROXICODONE) 5 MG immediate release tablet Take 5 mg by mouth every 6 (six) hours as needed for moderate pain or severe pain.    [provider]  TYLENOL 500 MG tablet Take 500-1,000 mg by mouth every 6 (six)  hours as needed for mild pain or headache.    [provider]  warfarin (JANTOVEN) 2.5 MG tablet TAKE 1/2 TABLET TO 1 TABLET BY MOUTH DAILY AS DIRECTED BY COUMADIN CLINIC Patient taking differently: Take 1.25-2.5 mg by mouth See admin instructions. Take 1.25 mg by mouth in the evening on Sun/Mon/Wed/Fri/Sat and 2.5 mg on Tues/Thurs 10/03/22   Corky Crafts, MD      Allergies    Capzasin [capsaicin], Darvon [propoxyphene], Lisinopril, Septra [sulfamethoxazole-trimethoprim], Voltaren [diclofenac], Warfarin and related, Tobrex [tobramycin], Trazodone and nefazodone,  Zolpidem tartrate, and Penicillins    Review of Systems   Review of Systems  All other systems reviewed and are negative.   Physical Exam Updated Vital Signs BP (!) 122/50   Pulse 67   Temp 98.3 F (36.8 C) (Oral)   Resp 16   SpO2 100%  Physical Exam Vitals and nursing note reviewed.  Constitutional:      General: She is not in acute distress.    Appearance: Normal appearance.  HENT:     Head: Normocephalic and atraumatic.  Eyes:     General:        Right eye: No discharge.        Left eye: No discharge.  Cardiovascular:     Rate and Rhythm: Normal rate and regular rhythm.     Heart sounds: No murmur heard.    No friction rub. No gallop.  Pulmonary:     Effort: Pulmonary effort is normal.     Breath sounds: Normal breath sounds.  Abdominal:     General: Bowel sounds are normal.     Palpations: Abdomen is soft.     Comments: No significant tenderness to palpation of the abdomen  Genitourinary:    Comments: Normal texture of soft brown stool in rectal vault,somewhat melanotic appearance, no gross blood on exam Skin:    General: Skin is warm and dry.     Capillary Refill: Capillary refill takes less than 2 seconds.  Neurological:     Mental Status: She is alert and oriented to person, place, and time.  Psychiatric:        Mood and Affect: Mood normal.        Behavior: Behavior normal.     ED Results / Procedures / Treatments   Labs (all labs ordered are listed, but only abnormal results are displayed) Labs Reviewed  CBC - Abnormal; Notable for the following components:      Result Value   RBC 2.12 (*)    Hemoglobin 6.3 (*)    HCT 21.7 (*)    MCV 102.4 (*)    MCHC 29.0 (*)    RDW 18.6 (*)    All other components within normal limits  COMPREHENSIVE METABOLIC PANEL - Abnormal; Notable for the following components:   CO2 19 (*)    Glucose, Bld 117 (*)    BUN 68 (*)    Creatinine, Ser 2.77 (*)    Calcium 8.8 (*)    Total Protein 5.9 (*)    Albumin 3.1 (*)     AST 60 (*)    ALT 51 (*)    GFR, Estimated 16 (*)    All other components within normal limits  PROTIME-INR - Abnormal; Notable for the following components:   Prothrombin Time 34.2 (*)    INR 3.4 (*)    All other components within normal limits  APTT - Abnormal; Notable for the following components:   aPTT 42 (*)  All other components within normal limits  POC OCCULT BLOOD, ED - Abnormal; Notable for the following components:   Fecal Occult Bld POSITIVE (*)    All other components within normal limits  TYPE AND SCREEN  PREPARE RBC (CROSSMATCH)    EKG None  Radiology No results found.  Procedures .Critical Care  Performed by: Olene Floss, PA-C Authorized by: Olene Floss, PA-C   Critical care provider statement:    Critical care time (minutes):  36   Critical care was necessary to treat or prevent imminent or life-threatening deterioration of the following conditions:  Circulatory failure (Symptomatic anemia requiring transfusion)   Critical care was time spent personally by me on the following activities:  Development of treatment plan with patient or surrogate, discussions with consultants, evaluation of patient's response to treatment, examination of patient, ordering and review of laboratory studies, ordering and review of radiographic studies, ordering and performing treatments and interventions, pulse oximetry, re-evaluation of patient's condition and review of old charts   Care discussed with: admitting provider       Medications Ordered in ED Medications  0.9 %  sodium chloride infusion (Manually program via Guardrails IV Fluids) (has no administration in time range)  sodium chloride 0.9 % bolus 1,000 mL (has no administration in time range)  pantoprazole (PROTONIX) injection 40 mg (40 mg Intravenous Given 08/25/23 1933)    ED Course/ Medical Decision Making/ A&P                                 Medical Decision Making  This patient is  a 89 y.o. female  who presents to the ED for concern of anemia, fatigue.   Differential diagnoses prior to evaluation: The emergent differential diagnosis includes, but is not limited to, anemia requiring transfusion, upper GI bleed, lower GI bleed, hemodynamically unstable GI bleed, versus other. This is not an exhaustive differential.   Past Medical History / Co-morbidities / Social History: CKD stage IV, chronic anticoagulation on warfarin, anemia, type 2 diabetes, hypertension, carotid stenosis, hyperlipidemia, paroxysmal A-fib, CHF, history of GI bleed  Additional history: Chart reviewed. Pertinent results include: Reviewed lab work, imaging from previous emergency department visits, hospital admissions, she was admitted earlier this year with GI bleed  Physical Exam: Physical exam performed. The pertinent findings include: Melena on rectal exam, vital signs overall stable although blood pressure was slightly low on arrival, 113/45, it has improved on recheck with MAP greater than 65 persistently for several hours.  She has no significant tenderness to palpation of the abdomen.  Lab Tests/Imaging studies: I personally interpreted labs/imaging and the pertinent results include: INR is elevated at 3.4, slightly supratherapeutic, will consult GI but likely plan to hold warfarin, CBC notable for significant anemia, hemoglobin 6.3, her Hemoccult is positive.  CMP is notable for AKI, BUN elevated 68, creatinine 2.77 from recent baseline creatinine of around 1.3-1.5 in the last several months, BUN likely secondary to blood product breakdown but dehydration in context of acute fluid losses could also contribute to her AKI.    Medications: I ordered medication including blood transfusion for symptomatic anemia, Protonix for upper GI bleed.  I have reviewed the patients home medicines and have made adjustments as needed.   Consults: Sent a message to Dr. Rhea Belton with GI to consult on this patient while  patient is hospitalized, spoke with the hospitalist, Dr. Charmayne Sheer, who agrees to admission for symptomatic anemia,  suspected upper GI bleed  Disposition: After consideration of the diagnostic results and the patients response to treatment, I feel that patient would benefit from admission for symptomatic anemia as discussed above.    Final Clinical Impression(s) / ED Diagnoses Final diagnoses:  None    Rx / DC Orders ED Discharge Orders     None         Olene Floss, PA-C 08/25/23 2036    Virgina Norfolk, DO 08/25/23 2246

## 2023-08-25 NOTE — Assessment & Plan Note (Addendum)
Has had multiple upper GI bleeds in the past.  Patient seems to be having another upper GI bleed at this time associated with severe anemia code, no hypotension.  Patient's warfarin for her paroxysmal atrial fibrillation, chronic as well as aortic valve mechanical will have to be held at this time.  I will give 2 units of FFP's.  Patient has been ordered for 1 unit of PRBC.  Given patient's current hemoglobin of 6.3 and a target of over 8 she will need another unit of PRBC which I have ordered.  Patient has been ordered for pantoprazole 40 IV twice daily.  She will receive 80 mg total today.  I will also order slow standing IV hydration. Patient LFT are chornically appearing abnormal. I will roder octreotide + ceftriaxone.. Patiet has AKI. Likely due to above. Patient received 1 liter bolus in Er. Trend.  GI eval being called by ER provider . In case of change of Abd exam, fever or leukocytosis, will consider abd imaging.

## 2023-08-25 NOTE — ED Triage Notes (Signed)
Pr BIB [facility] from [facility] for low Hgb of 5.9.  Pt needed transfusion last time.  Facility endorses some rectal bleeding.   128/68 HR 50-70 afib at baseline, 100% A&O x4

## 2023-08-26 ENCOUNTER — Inpatient Hospital Stay (HOSPITAL_COMMUNITY): Payer: Medicare Other

## 2023-08-26 ENCOUNTER — Inpatient Hospital Stay (HOSPITAL_COMMUNITY): Payer: Medicare Other | Admitting: Anesthesiology

## 2023-08-26 ENCOUNTER — Encounter (HOSPITAL_COMMUNITY): Payer: Self-pay | Admitting: Internal Medicine

## 2023-08-26 ENCOUNTER — Encounter (HOSPITAL_COMMUNITY)
Admission: EM | Disposition: A | Discharge status: Skilled Nursing Facility | Payer: Self-pay | Source: Skilled Nursing Facility | Attending: Family Medicine

## 2023-08-26 DIAGNOSIS — K625 Hemorrhage of anus and rectum: Secondary | ICD-10-CM

## 2023-08-26 DIAGNOSIS — I480 Paroxysmal atrial fibrillation: Secondary | ICD-10-CM

## 2023-08-26 DIAGNOSIS — R413 Other amnesia: Secondary | ICD-10-CM | POA: Diagnosis not present

## 2023-08-26 DIAGNOSIS — K31819 Angiodysplasia of stomach and duodenum without bleeding: Secondary | ICD-10-CM

## 2023-08-26 DIAGNOSIS — K319 Disease of stomach and duodenum, unspecified: Secondary | ICD-10-CM

## 2023-08-26 DIAGNOSIS — D62 Acute posthemorrhagic anemia: Secondary | ICD-10-CM

## 2023-08-26 DIAGNOSIS — K922 Gastrointestinal hemorrhage, unspecified: Secondary | ICD-10-CM | POA: Diagnosis not present

## 2023-08-26 DIAGNOSIS — D649 Anemia, unspecified: Secondary | ICD-10-CM | POA: Diagnosis not present

## 2023-08-26 DIAGNOSIS — Z952 Presence of prosthetic heart valve: Secondary | ICD-10-CM

## 2023-08-26 DIAGNOSIS — R7989 Other specified abnormal findings of blood chemistry: Secondary | ICD-10-CM | POA: Diagnosis not present

## 2023-08-26 HISTORY — PX: HOT HEMOSTASIS: SHX5433

## 2023-08-26 HISTORY — PX: ENTEROSCOPY: SHX5533

## 2023-08-26 LAB — CBC
HCT: 25.5 % — ABNORMAL LOW (ref 36.0–46.0)
HCT: 28.7 % — ABNORMAL LOW (ref 36.0–46.0)
Hemoglobin: 7.7 g/dL — ABNORMAL LOW (ref 12.0–15.0)
Hemoglobin: 9 g/dL — ABNORMAL LOW (ref 12.0–15.0)
MCH: 29.8 pg (ref 26.0–34.0)
MCH: 30.3 pg (ref 26.0–34.0)
MCHC: 30.2 g/dL (ref 30.0–36.0)
MCHC: 31.4 g/dL (ref 30.0–36.0)
MCV: 96.6 fL (ref 80.0–100.0)
MCV: 98.8 fL (ref 80.0–100.0)
Platelets: 176 10*3/uL (ref 150–400)
Platelets: 181 10*3/uL (ref 150–400)
RBC: 2.58 MIL/uL — ABNORMAL LOW (ref 3.87–5.11)
RBC: 2.97 MIL/uL — ABNORMAL LOW (ref 3.87–5.11)
RDW: 18.7 % — ABNORMAL HIGH (ref 11.5–15.5)
RDW: 19.2 % — ABNORMAL HIGH (ref 11.5–15.5)
WBC: 4.2 10*3/uL (ref 4.0–10.5)
WBC: 4.2 10*3/uL (ref 4.0–10.5)
nRBC: 0.5 % — ABNORMAL HIGH (ref 0.0–0.2)
nRBC: 0.7 % — ABNORMAL HIGH (ref 0.0–0.2)

## 2023-08-26 LAB — URINALYSIS, COMPLETE (UACMP) WITH MICROSCOPIC
Bilirubin Urine: NEGATIVE
Glucose, UA: NEGATIVE mg/dL
Hgb urine dipstick: NEGATIVE
Ketones, ur: 5 mg/dL — AB
Nitrite: NEGATIVE
Protein, ur: 30 mg/dL — AB
Specific Gravity, Urine: 1.01 (ref 1.005–1.030)
WBC, UA: >50 WBC/hpf (ref 0–5)
pH: 5 (ref 5.0–8.0)

## 2023-08-26 LAB — BASIC METABOLIC PANEL
Anion gap: 9 (ref 5–15)
BUN: 65 mg/dL — ABNORMAL HIGH (ref 8–23)
CO2: 19 mmol/L — ABNORMAL LOW (ref 22–32)
Calcium: 8.6 mg/dL — ABNORMAL LOW (ref 8.9–10.3)
Chloride: 108 mmol/L (ref 98–111)
Creatinine, Ser: 2.55 mg/dL — ABNORMAL HIGH (ref 0.44–1.00)
GFR, Estimated: 18 mL/min — ABNORMAL LOW (ref >60)
Glucose, Bld: 102 mg/dL — ABNORMAL HIGH (ref 70–99)
Potassium: 4.6 mmol/L (ref 3.5–5.1)
Sodium: 136 mmol/L (ref 135–145)

## 2023-08-26 LAB — APTT: aPTT: 40 s — ABNORMAL HIGH (ref 24–36)

## 2023-08-26 LAB — TSH: TSH: 2.352 u[IU]/mL (ref 0.350–4.500)

## 2023-08-26 LAB — BRAIN NATRIURETIC PEPTIDE: B Natriuretic Peptide: 581 pg/mL — ABNORMAL HIGH (ref 0.0–100.0)

## 2023-08-26 LAB — HEMOGLOBIN AND HEMATOCRIT, BLOOD
HCT: 31.5 % — ABNORMAL LOW (ref 36.0–46.0)
Hemoglobin: 9.7 g/dL — ABNORMAL LOW (ref 12.0–15.0)

## 2023-08-26 LAB — VITAMIN B12: Vitamin B-12: 2160 pg/mL — ABNORMAL HIGH (ref 180–914)

## 2023-08-26 LAB — PROTIME-INR
INR: 2.7 — ABNORMAL HIGH (ref 0.8–1.2)
Prothrombin Time: 29.1 s — ABNORMAL HIGH (ref 11.4–15.2)

## 2023-08-26 LAB — CREATININE, URINE, RANDOM: Creatinine, Urine: 65 mg/dL

## 2023-08-26 LAB — SODIUM, URINE, RANDOM: Sodium, Ur: 56 mmol/L

## 2023-08-26 SURGERY — ENTEROSCOPY
Anesthesia: Monitor Anesthesia Care

## 2023-08-26 MED ORDER — EPHEDRINE SULFATE (PRESSORS) 50 MG/ML IJ SOLN
INTRAMUSCULAR | Status: DC | PRN
  Administered 2023-08-26: 10 mg via INTRAVENOUS

## 2023-08-26 MED ORDER — FUROSEMIDE 10 MG/ML IJ SOLN
20.0000 mg | Freq: Once | INTRAMUSCULAR | Status: AC
  Administered 2023-08-26: 20 mg via INTRAVENOUS
  Filled 2023-08-26: qty 2

## 2023-08-26 MED ORDER — FUROSEMIDE 40 MG PO TABS
40.0000 mg | ORAL_TABLET | Freq: Every day | ORAL | Status: DC
  Administered 2023-08-26 – 2023-08-27 (×2): 40 mg via ORAL
  Filled 2023-08-26 (×2): qty 1

## 2023-08-26 MED ORDER — PROPOFOL 500 MG/50ML IV EMUL
INTRAVENOUS | Status: DC | PRN
  Administered 2023-08-26: 30 mg via INTRAVENOUS
  Administered 2023-08-26: 40 mg via INTRAVENOUS
  Administered 2023-08-26: 60 ug/kg/min via INTRAVENOUS

## 2023-08-26 MED ORDER — SODIUM CHLORIDE 0.9 % IV SOLN
INTRAVENOUS | Status: DC | PRN
Start: 2023-08-26 — End: 2023-08-26

## 2023-08-26 MED ORDER — IPRATROPIUM-ALBUTEROL 0.5-2.5 (3) MG/3ML IN SOLN
3.0000 mL | Freq: Four times a day (QID) | RESPIRATORY_TRACT | Status: DC | PRN
  Administered 2023-08-26 – 2023-08-28 (×5): 3 mL via RESPIRATORY_TRACT
  Filled 2023-08-26 (×5): qty 3

## 2023-08-26 MED ORDER — LIDOCAINE 2% (20 MG/ML) 5 ML SYRINGE
INTRAMUSCULAR | Status: DC | PRN
  Administered 2023-08-26: 60 mg via INTRAVENOUS

## 2023-08-26 MED ORDER — LORAZEPAM 2 MG/ML IJ SOLN
0.5000 mg | Freq: Once | INTRAMUSCULAR | Status: AC
  Administered 2023-08-26: 0.5 mg via INTRAVENOUS
  Filled 2023-08-26: qty 1

## 2023-08-26 MED ORDER — PHENYLEPHRINE HCL (PRESSORS) 10 MG/ML IV SOLN
INTRAVENOUS | Status: DC | PRN
  Administered 2023-08-26: 80 ug via INTRAVENOUS

## 2023-08-26 NOTE — ED Notes (Signed)
Pt reports feeling very anxious. Lama MD notified and prescribed 0.5mg  IV ativan.

## 2023-08-26 NOTE — Transfer of Care (Signed)
Immediate Anesthesia Transfer of Care Note  Patient: AMBER KANHAI  Procedure(s) Performed: ENTEROSCOPY HOT HEMOSTASIS (ARGON PLASMA COAGULATION/BICAP)  Patient Location: PACU and Short Stay  Anesthesia Type General  Level of Consciousness: drowsy  Airway & Oxygen Therapy: Patient Spontanous Breathing  Post-op Assessment: Report given to RN and Post -op Vital signs reviewed and stable  Post vital signs: Reviewed and stable  Last Vitals:  Vitals Value Taken Time  BP 90/46   Temp    Pulse 68 08/26/23 1539  Resp 12 08/26/23 1539  SpO2 97 % 08/26/23 1539  Vitals shown include unfiled device data.  Last Pain:  Vitals:   08/26/23 1153  TempSrc: Temporal  PainSc: 0-No pain         Complications: No notable events documented.

## 2023-08-26 NOTE — Progress Notes (Addendum)
[facility]  PROGRESS NOTE  DEMMI FUGATE ZOX:[id number] DOB: 10-17-1932 DOA: 08/25/2023 PCP: [facility]   Brief HPI:   89 y.o. female with medical history significant of "short term memory loss" per the husband, progresing for 6 months.  History obtained from patient over the phone   Patient is a resident of nursing home.  Patient's husband noted that he she was having black stools for the last couple of days and requested that the patient's blood be tested, which revealed severe anemia    Assessment/Plan:   GI bleed -Has history of multiple upper GI bleed in the past -Presented with severe anemia in setting of anticoagulation with warfarin for A-fib as well as aortic mechanical valve -Hemoglobin 6.3; status post blood transfusion -Hemoglobin improved to 9.0 this morning -Continue PPI, octreotide -GI consulted  Transaminitis -Mild elevation of LFTs -GI following  Bilateral wheezing -Concern for pulmonary edema, will discontinue IV fluids -Lasix 20 mg IV given.  Good diuresis -Patient takes Lasix 40 mg daily at home which will be resumed -Will check BNP, chest x-ray  History of amnesia -Going on for past 6 months as per patient has been -TSH 2.352, B12 2,160  History of mechanical arctic valve -Supratherapeutic INR was reversed with FFP's -Coumadin on hold due to GI bleed  Medications     atorvastatin  40 mg Oral Daily   ezetimibe  10 mg Oral Daily   furosemide  40 mg Oral Daily   levothyroxine  88 mcg Oral Q0600   pantoprazole (PROTONIX) IV  40 mg Intravenous Q12H   sodium chloride flush  3 mL Intravenous Q12H     Data Reviewed:   CBG:  No results for input(s): "GLUCAP" in the last 168 hours.  SpO2: 100 % O2 Flow Rate (L/min): 2 L/min    Vitals:   08/26/23 0545 08/26/23 0617 08/26/23 0746 08/26/23 0759  BP: (!) 112/46 (!) 116/49    Pulse: 63 65 94 68  Resp: 15 17 16  (!) 24  Temp:  97.8 F (36.6 C)    TempSrc:  Oral     SpO2: 96%  (!) 89% 100%      Data Reviewed:  Basic Metabolic Panel: Recent Labs  Lab 08/25/23 1859 08/26/23 0324  NA 135 136  K 4.5 4.6  CL 106 108  CO2 19* 19*  GLUCOSE 117* 102*  BUN 68* 65*  CREATININE 2.77* 2.55*  CALCIUM 8.8* 8.6*    CBC: Recent Labs  Lab 08/25/23 1859 08/26/23 0022 08/26/23 0324  WBC 4.7 4.2 4.2  HGB 6.3* 7.7* 9.0*  HCT 21.7* 25.5* 28.7*  MCV 102.4* 98.8 96.6  PLT 199 176 181    LFT Recent Labs  Lab 08/25/23 1859  AST 60*  ALT 51*  ALKPHOS 64  BILITOT 0.7  PROT 5.9*  ALBUMIN 3.1*     Antibiotics: Anti-infectives (From admission, onward)    Start     Dose/Rate Route Frequency Ordered Stop   08/25/23 2230  cefTRIAXone (ROCEPHIN) 2 g in sodium chloride 0.9 % 100 mL IVPB        2 g 200 mL/hr over 30 Minutes Intravenous Every 24 hours 08/25/23 2111          DVT prophylaxis: SCDs  Code Status: Full code  Family Communication: No family at bedside   CONSULTS gastroenterology   Subjective   Has been wheezing this morning, getting IV fluids.  Objective    Physical Examination:   General-appears in no  acute distress Heart-S1-S2, regular, no murmur auscultated Lungs-bilateral wheezing auscultated Abdomen-soft, nontender, no organomegaly Extremities-no edema in the lower extremities Neuro-alert, oriented x3, no focal deficit noted   Status is: Inpatient:             [name]   [facility] If 7PM-7AM, please contact night-coverage at [email or web address], Office  [phone]   08/26/2023, 8:01 AM  LOS: 1 day

## 2023-08-26 NOTE — Consult Note (Signed)
Referring Provider: Luther Hearing PA-C Primary Care Physician:  [facility] Primary Gastroenterologist:  Dr. Amada Jupiter   Reason for Consultation: Symptomatic anemia, GI bleed/melena  HPI: [name] is a 89 y.o. female with a past medical history of arthritis, hypertension, hyperlipidemia, coronary artery disease s/p CABG 1990, chronic diastolic CHF, paroxysmal atrial fibrillation, s/p AVR (mechanical valve) on Warfarin 1993, carotid artery disease, CKD stage IV, diabetes mellitus type 2, cellulitis, macrocytic anemia, GI bleed secondary to H. 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 Husband noted patient was passing black stools for a few days and a hemoglobin level was 5.9.  She was transported to [facility] ED 08/25/2023 for further evaluation.  Labs in the ED showed a hemoglobin level of 6.3 down from 10 on 10/12/2022.  Hematocrit 21.7.  MCV 102.4.  Platelet 199.  FOBT positive.  BUN 68 up from 26 on 10/12/2022.  Creatinine 2.77 up from 1.36.  Albumin 3.1.  Total bili 0.7.  Alk phos 64.  AST 60.  ALT 51.  INR 3.4.  Transfused 2 units of PRBCs and 2 units of FFP 12/3 - 12/4. Posttransfusion hemoglobin 7.7 -> 9.0.  INR 2.7.  PTT 40.  On PPI IV twice daily and Octreotide infusion.  Patient has short-term memory impairment but denies seeing any rectal bleeding or black stools.  She endorses having shortness of breath for the past few days which was more severe yesterday with mild chest tightness. No nausea or vomiting.  No dysphagia or heartburn.  No abdominal or rectal pain.  She believes her last dose of Warfarin was yesterday.  She has a history of  H. pylori gastric ulcer 10/2019 treated with Bismuth, Doxycycline, Levaquin and PPI.  GI bleed, likely due to small bowel AVMs per EGD 10/2019.  See GI procedure summary below.  GI PROCEDURES:  EGD 12/01/2019: - Z-line regular, 37 cm from the incisors. - Normal esophagus.  - Non-obstructing  non-bleeding gastric ulcers with a clean ulcer base (Forrest Class III). - Normal examined duodenum.  - No specimens collected  Small bowel endoscopy 11/06/2019: - Normal esophagus.  - Non-bleeding gastric ulcer with no stigmata of bleeding. Biopsied.  - Non-bleeding gastric ulcer with no stigmata of bleeding.  - Normal examined duodenum.  - A single non-bleeding angioectasia in the jejunum. Treated with argon plasma coagulation (APC).  - One biopsy was obtained in the prepyloric region of the stomach.  H. Pylori treated with bismuth/doxycycline/Levaquin along with PPI.   Flexible sigmoidoscopy 04/21/2015: -Small internal and external hemorrhoids otherwise normal  Colonoscopy 02/13/2013: Internal hemorrhoids Large amount of fresh blood throughout the colon extending to the cecum  Small bowel endoscopy 02/13/2013: -Gastritis to the antrum, 4 punched-out erosions at the prepyloric/antrum area  Capsule endoscopy 08/22/2005: Multiple pinpoint AVMs scattered throughout the small bowel Small amount of heme seen in the cecum without colonic AVM  ECHO 12/01/2018: 1. The left ventricle has hyperdynamic systolic function, with an  ejection fraction of >65%. The cavity size was normal.   2. The right ventricle has normal systolc function. The cavity was  normal. There is no increase in right ventricular wall thickness.   3. Mild mitral valve prolapse.   4. Mitral valve regurgitation is mild to moderate by color flow Doppler.   5. Tricuspid valve regurgitation is moderate.   6. Aortic valve regurgitation mild perivalvular leak.   7. Pulmonic valve regurgitation was not assessed by color flow Doppler.   8.  There is evidence of plaque in the descending aorta.   9. No vegetations, no endocarditis.  10. - TAVR: TAVR valve with mild perivalvular leak. No vegetations.   Past Medical History:  Diagnosis Date   Anemia    Anginal pain (HCC)    Arteriovenous malformation of gastrointestinal tract     Arthritis    "fingers mostly" (04/12/2015)   Carotid artery disease (HCC)    Carotid US 3/17:  60-79% RICA; 40-59% LICA; Elevated bilateral subclavian artery velocities >>f/u 1 year. // Carotid US 4/18: R 40-59; L 1-39 >> FU 1 year // Carotid US 10/2018: R 40-59; L 1-39, L subclavian stenosis    CHF (congestive heart failure) (HCC)    Chronic kidney disease (CKD), stage III (moderate) (HCC)    Chronic lower GI bleeding    "today; last time was ~ 8 yr ago; used to have them often before that too" (02/11/2013)   Coronary artery disease    DJD (degenerative joint disease)    Dysrhythmia    Heart murmur    History of blood transfusion    "a few times over the years; usually related to my Coumadin" (04/12/2015   History of gout    Hyperlipemia    Hypertension    Macula lutea degeneration    Old MI (myocardial infarction)    "a coulple /dr in 02/2008; I never even knew I'd had them" (04/12/2015)   Type II diabetes mellitus (HCC)    no medications, diet controlled    Past Surgical History:  Procedure Laterality Date   AORTIC VALVE REPLACEMENT  09/23/1991   St. Jude   APPENDECTOMY  09/23/1951   BIOPSY  11/06/2019   Procedure: BIOPSY;  Surgeon: Sherrilyn Rist, MD;  Location[facility] ENDOSCOPY;  Service: Gastroenterology;;   CARDIAC CATHETERIZATION  ~1990   CARDIAC CATHETERIZATION  04/12/2015   Procedure: Coronary Stent Intervention;  Surgeon: Corky Crafts, MD;    SYNERGY DES 3.5X16 to the ostial RCA    CARDIAC CATHETERIZATION  04/12/2015   Procedure: Coronary/Graft Angiography;  Surgeon: Oren Binet, MD; LAD & CFX 100%, patent LIMA-LAD, SVG-D1; SVG-OM-PDA first limb 100%, 2nd limb patent; oRCA 75%>0 w/ stent   CARDIAC VALVE REPLACEMENT  09/22/1992   St. Jude/notes 10/29/2003 (02/11/2013)   CARDIOVERSION N/A 08/15/2019   Procedure: CARDIOVERSION;  Surgeon: Vesta Mixer, MD;  Location: [facility] ENDOSCOPY;  Service: Cardiovascular;  Laterality: N/A;   COLONOSCOPY N/A 02/13/2013    Procedure: COLONOSCOPY;  Surgeon: Hart Carwin, MD;  Location: [facility] ENDOSCOPY;  Service: Endoscopy;  Laterality: N/A;   CORONARY ANGIOPLASTY WITH STENT PLACEMENT  2009+   "3 at least; put in 1 stent at a time" (02/11/2013)   CORONARY ARTERY BYPASS GRAFT  09/22/1988   LIMA-LAD, SVG-OM-PDA, SVG-D1   DILATION AND CURETTAGE OF UTERUS  05/24/1959   'after a miscarriage" (02/11/2013)   ENTEROSCOPY N/A 02/13/2013   Procedure: ENTEROSCOPY;  Surgeon: Hart Carwin, MD;  Location: [facility] ENDOSCOPY;  Service: Endoscopy;  Laterality: N/A;   ENTEROSCOPY N/A 11/06/2019   Procedure: ENTEROSCOPY;  Surgeon: Sherrilyn Rist, MD;  Location: [facility] ENDOSCOPY;  Service: Gastroenterology;  Laterality: N/A;   ESOPHAGOGASTRODUODENOSCOPY (EGD) WITH PROPOFOL N/A 12/01/2019   Procedure: ESOPHAGOGASTRODUODENOSCOPY (EGD) WITH PROPOFOL;  Surgeon: Napoleon Form, MD;  Location: [facility] ENDOSCOPY;  Service: Endoscopy;  Laterality: N/A;   EXPLORATORY LAPAROTOMY  02/24/2008   which revealed a retroperitoneal hematoma and bleeding from the right external iliac artery/notes 03/02/2008 (02/11/2013)    FLEXIBLE SIGMOIDOSCOPY  N/A 04/21/2015   Procedure: FLEXIBLE SIGMOIDOSCOPY;  Surgeon: Meryl Dare, MD;  Location: [facility] ENDOSCOPY;  Service: Endoscopy;  Laterality: N/A;   HOT HEMOSTASIS N/A 11/06/2019   Procedure: HOT HEMOSTASIS (ARGON PLASMA COAGULATION/BICAP);  Surgeon: Sherrilyn Rist, MD;  Location: [facility] ENDOSCOPY;  Service: Gastroenterology;  Laterality: N/A;   LUMBAR LAMINECTOMY/DECOMPRESSION MICRODISCECTOMY Left 06/02/2022   Procedure: LEFT LUMBAR FOUR-FIVE MICRODISCECTOMY;  Surgeon: Tressie Stalker, MD;  Location: [facility] OR;  Service: Neurosurgery;  Laterality: Left;  3C   TEE WITHOUT CARDIOVERSION N/A 12/01/2018   Procedure: TRANSESOPHAGEAL ECHOCARDIOGRAM (TEE);  Surgeon: Jake Bathe, MD;  Location: [facility] ENDOSCOPY;  Service: Cardiovascular;  Laterality: N/A;    Prior to Admission medications   Medication Sig Start Date End Date  Taking? Authorizing Provider  amiodarone (PACERONE) 200 MG tablet Take 1 tablet by mouth once daily Patient taking differently: Take 200 mg by mouth daily. 03/10/22  Yes Corky Crafts, MD  atorvastatin (LIPITOR) 40 MG tablet Take 1 tablet (40 mg total) by mouth daily. Patient taking differently: Take 40 mg by mouth at bedtime. 10/31/22  Yes Corky Crafts, MD  Bevacizumab (AVASTIN IV) Inject 1 Dose into the vein every 8 (eight) weeks.   Yes [provider]  celecoxib (CELEBREX) 200 MG capsule Take 200 mg by mouth 2 (two) times daily as needed for mild pain (pain score 1-3).   Yes [provider]  cephALEXin (KEFLEX) 500 MG capsule Take 2,000 mg by mouth as needed. As needed for dental prophylaxis   Yes [provider]  clonazePAM (KLONOPIN) 0.5 MG tablet Take 0.5 mg by mouth 2 (two) times daily.   Yes [provider]  clonazePAM (KLONOPIN) 0.5 MG tablet Take 0.5 mg by mouth 2 (two) times daily as needed for anxiety.   Yes [provider]  Emollient (BAG BALM) OINT Apply 1 Application topically daily. Apply to bilateral lower extremities for dry, scaly skin   Yes [provider]  ezetimibe (ZETIA) 10 MG tablet Take 1 tablet by mouth once daily Patient taking differently: Take 10 mg by mouth at bedtime. 02/03/22  Yes Corky Crafts, MD  FeFum-FePo-FA-B Cmp-C-Zn-Mn-Cu (SE-TAN PLUS) 162-115.2-1 MG CAPS Take 1 capsule by mouth in the morning and at bedtime.   Yes [provider]  folic acid (FOLVITE) 400 MCG tablet Take 400 mcg by mouth in the morning.   Yes [provider]  furosemide (LASIX) 40 MG tablet Take 40 -80 mg by mouth daily based on renal function Patient taking differently: Take 40 mg by mouth 2 (two) times daily. 10/31/22  Yes Corky Crafts, MD  isosorbide mononitrate (IMDUR) 60 MG 24 hr tablet Take 1 tablet by mouth once daily Patient taking differently: 60 mg daily. 05/07/22  Yes Corky Crafts, MD  levothyroxine (SYNTHROID) 88 MCG tablet Take 88 mcg by mouth at bedtime. 08/16/23  Yes [provider]  losartan (COZAAR) 50 MG tablet Take 50 mg by mouth in the morning.   Yes [provider]  memantine (NAMENDA) 5 MG tablet Take 5 mg by mouth 2 (two) times daily. 07/29/23  Yes [provider]  Multiple Vitamin (MULTIVITAMIN WITH MINERALS) TABS Take 1 tablet by mouth daily with breakfast.   Yes [provider]  Multiple Vitamins-Minerals (PRESERVISION AREDS 2 PO) Take 1 capsule by mouth 2 (two) times daily.   Yes [provider]  Nutritional Supplements (BOOST GLUCOSE CONTROL PO) Take 1 Bottle by mouth daily.   Yes [provider]  Omega-3 Fatty Acids (FISH OIL PO) Take 700 mg by mouth 2 (two) times daily.   Yes [provider]  pantoprazole (PROTONIX) 40 MG tablet Take 40 mg by mouth daily. 08/10/23  Yes [provider]  potassium chloride (MICRO-K) 10 MEQ CR capsule Take 20 mEq by mouth daily. 08/15/23  Yes [provider]  sertraline (ZOLOFT) 25 MG tablet Take 25 mg by mouth daily. 08/14/23  Yes [provider]  TYLENOL 500 MG tablet Take 500-1,000 mg by mouth every 6 (six) hours as needed for mild pain or headache.   Yes [provider]  warfarin (COUMADIN) 1 MG tablet Take 1 mg by mouth 3 (three) times a week. Give 1 tablet every Ulanda Edison, and Sat at 1700 08/27/23  Yes [provider]  warfarin (COUMADIN) 2 MG tablet Take 2 mg by mouth 4 (four) times a week. Give 1 tablet every Mon, Wed, Fri, and Sun 08/28/23  Yes [provider]  warfarin (COUMADIN) 2 MG tablet Take 2 mg by mouth at bedtime.   Yes [provider]  nitroGLYCERIN (NITROSTAT) 0.4 MG SL tablet DISSOLVE ONE TABLET UNDER THE TONGUE EVERY 5 MINUTES AS NEEDED FOR CHEST PAIN.  DO NOT EXCEED A TOTAL OF 3 DOSES IN 15 MINUTES Patient taking differently: Place 0.4 mg under the tongue every 5 (five) minutes x  3 doses as needed for chest pain. 02/14/20   Corky Crafts, MD  oxyCODONE (OXY IR/ROXICODONE) 5 MG immediate release tablet Take 5 mg by mouth every 6 (six) hours as needed for moderate pain or severe pain.    [provider]    Current Facility-Administered Medications  Medication Dose Route Frequency Provider Last Rate Last Admin   acetaminophen (TYLENOL) tablet 650 mg  650 mg Oral Q6H PRN Nolberto Hanlon, MD       Or   acetaminophen (TYLENOL) suppository 650 mg  650 mg Rectal Q6H PRN Nolberto Hanlon, MD       atorvastatin (LIPITOR) tablet 40 mg  40 mg Oral Daily Nolberto Hanlon, MD       cefTRIAXone (ROCEPHIN) 2 g in sodium chloride 0.9 % 100 mL IVPB  2 g Intravenous Q24H Nolberto Hanlon, MD   Stopped at 08/26/23 0003   clonazePAM (KLONOPIN) tablet 0.5 mg  0.5 mg Oral BID PRN Nolberto Hanlon, MD   0.5 mg at 08/26/23 0240   dextrose 5% in lactated ringers with KCl 20 mEq/L infusion   Intravenous Continuous Nolberto Hanlon, MD 75 mL/hr at 08/26/23 0616 New Bag at 08/26/23 0616   ezetimibe (ZETIA) tablet 10 mg  10 mg Oral Daily Nolberto Hanlon, MD       furosemide (LASIX) tablet 40 mg  40 mg Oral Daily Nolberto Hanlon, MD       levothyroxine (SYNTHROID) tablet 88 mcg  88 mcg Oral Q0600 Nolberto Hanlon, MD   88 mcg at 08/26/23 4332   octreotide (SANDOSTATIN) 500 mcg in sodium chloride 0.9 % 250 mL (2 mcg/mL) infusion  50 mcg/hr Intravenous Continuous Nolberto Hanlon, MD 25 mL/hr at 08/26/23 0024 50 mcg/hr at 08/26/23 0024   pantoprazole (PROTONIX) injection 40 mg  40 mg Intravenous Q12H Nolberto Hanlon, MD       polyethylene glycol (MIRALAX / GLYCOLAX) packet 17 g  17 g Oral Daily PRN Nolberto Hanlon, MD       sodium chloride 0.9 % bolus 1,000 mL  1,000 mL Intravenous Once Nolberto Hanlon, MD  sodium chloride flush (NS) 0.9 % injection 3 mL  3 mL Intravenous Q12H Nolberto Hanlon, MD       Current Outpatient Medications  Medication Sig Dispense Refill   amiodarone (PACERONE) 200 MG tablet Take 1 tablet by mouth once daily  (Patient taking differently: Take 200 mg by mouth daily.) 90 tablet 2   atorvastatin (LIPITOR) 40 MG tablet Take 1 tablet (40 mg total) by mouth daily. (Patient taking differently: Take 40 mg by mouth at bedtime.) 90 tablet 3   Bevacizumab (AVASTIN IV) Inject 1 Dose into the vein every 8 (eight) weeks.     celecoxib (CELEBREX) 200 MG capsule Take 200 mg by mouth 2 (two) times daily as needed for mild pain (pain score 1-3).     cephALEXin (KEFLEX) 500 MG capsule Take 2,000 mg by mouth as needed. As needed for dental prophylaxis     clonazePAM (KLONOPIN) 0.5 MG tablet Take 0.5 mg by mouth 2 (two) times daily.     clonazePAM (KLONOPIN) 0.5 MG tablet Take 0.5 mg by mouth 2 (two) times daily as needed for anxiety.     Emollient (BAG BALM) OINT Apply 1 Application topically daily. Apply to bilateral lower extremities for dry, scaly skin     ezetimibe (ZETIA) 10 MG tablet Take 1 tablet by mouth once daily (Patient taking differently: Take 10 mg by mouth at bedtime.) 90 tablet 2   FeFum-FePo-FA-B Cmp-C-Zn-Mn-Cu (SE-TAN PLUS) 162-115.2-1 MG CAPS Take 1 capsule by mouth in the morning and at bedtime.     folic acid (FOLVITE) 400 MCG tablet Take 400 mcg by mouth in the morning.     furosemide (LASIX) 40 MG tablet Take 40 -80 mg by mouth daily based on renal function (Patient taking differently: Take 40 mg by mouth 2 (two) times daily.) 90 tablet 3   isosorbide mononitrate (IMDUR) 60 MG 24 hr tablet Take 1 tablet by mouth once daily (Patient taking differently: 60 mg daily.) 90 tablet 2   levothyroxine (SYNTHROID) 88 MCG tablet Take 88 mcg by mouth at bedtime.     losartan (COZAAR) 50 MG tablet Take 50 mg by mouth in the morning.     memantine (NAMENDA) 5 MG tablet Take 5 mg by mouth 2 (two) times daily.     Multiple Vitamin (MULTIVITAMIN WITH MINERALS) TABS Take 1 tablet by mouth daily with breakfast.     Multiple Vitamins-Minerals (PRESERVISION AREDS 2 PO) Take 1 capsule by mouth 2 (two) times daily.      Nutritional Supplements (BOOST GLUCOSE CONTROL PO) Take 1 Bottle by mouth daily.     Omega-3 Fatty Acids (FISH OIL PO) Take 700 mg by mouth 2 (two) times daily.     pantoprazole (PROTONIX) 40 MG tablet Take 40 mg by mouth daily.     potassium chloride (MICRO-K) 10 MEQ CR capsule Take 20 mEq by mouth daily.     sertraline (ZOLOFT) 25 MG tablet Take 25 mg by mouth daily.     TYLENOL 500 MG tablet Take 500-1,000 mg by mouth every 6 (six) hours as needed for mild pain or headache.     [START ON 08/27/2023] warfarin (COUMADIN) 1 MG tablet Take 1 mg by mouth 3 (three) times a week. Give 1 tablet every Tues, Thurs, and Sat at 1700     [START ON 08/28/2023] warfarin (COUMADIN) 2 MG tablet Take 2 mg by mouth 4 (four) times a week. Give 1 tablet every Mon, Wed, Fri, and Sun     warfarin (  COUMADIN) 2 MG tablet Take 2 mg by mouth at bedtime.     nitroGLYCERIN (NITROSTAT) 0.4 MG SL tablet DISSOLVE ONE TABLET UNDER THE TONGUE EVERY 5 MINUTES AS NEEDED FOR CHEST PAIN.  DO NOT EXCEED A TOTAL OF 3 DOSES IN 15 MINUTES (Patient taking differently: Place 0.4 mg under the tongue every 5 (five) minutes x 3 doses as needed for chest pain.) 25 tablet 5   oxyCODONE (OXY IR/ROXICODONE) 5 MG immediate release tablet Take 5 mg by mouth every 6 (six) hours as needed for moderate pain or severe pain.      Allergies as of 08/25/2023 - Reviewed 08/25/2023  Allergen Reaction Noted   Capzasin [capsaicin] Other (See Comments) 10/10/2022   Darvon [propoxyphene] Nausea And Vomiting 11/28/2013   Lisinopril Cough 11/28/2013   Septra [sulfamethoxazole-trimethoprim] Other (See Comments) 11/28/2013   Voltaren [diclofenac] Other (See Comments) 10/10/2022   Warfarin and related Other (See Comments) 04/15/2015   Tobrex [tobramycin] Other (See Comments) 10/10/2022   Trazodone and nefazodone Other (See Comments) 05/28/2022   Zolpidem tartrate Other (See Comments) 05/28/2022   Penicillins Rash 04/10/2012    Family History  Problem  Relation Age of Onset   Heart disease Mother    Heart disease Father     Social History   Socioeconomic History   Marital status: Married    Spouse name: Not on file   Number of children: 2   Years of education: Not on file   Highest education level: Not on file  Occupational History   Not on file  Tobacco Use   Smoking status: Never   Smokeless tobacco: Never  Vaping Use   Vaping status: Never Used  Substance and Sexual Activity   Alcohol use: No   Drug use: No   Sexual activity: Yes  Other Topics Concern   Not on file  Social History Narrative   Not on file   Social Determinants of Health   Financial Resource Strain: Not on file  Food Insecurity: No Food Insecurity (10/06/2022)   Hunger Vital Sign    Worried About Running Out of Food in the Last Year: Never true    Ran Out of Food in the Last Year: Never true  Transportation Needs: No Transportation Needs (10/06/2022)   PRAPARE - Administrator, Civil Service (Medical): No    Lack of Transportation (Non-Medical): No  Physical Activity: Not on file  Stress: Not on file  Social Connections: Not on file  Intimate Partner Violence: Not At Risk (10/06/2022)   Humiliation, Afraid, Rape, and Kick questionnaire    Fear of Current or Ex-Partner: No    Emotionally Abused: No    Physically Abused: No    Sexually Abused: No    Review of Systems: Gen: Denies fever, sweats or chills. No weight loss.  CV: Denies chest pain, palpitations or edema. Resp: Denies cough, shortness of breath of hemoptysis.  GI: See HPI.  GU : + Urinary incontinence. MS: Denies joint pain, muscles aches or weakness. Derm: Denies rash, itchiness, skin lesions or unhealing ulcers. Psych: + Short-term memory issues.   Heme: Denies easy bruising, bleeding. Neuro:  Denies headaches, dizziness or paresthesias. Endo:  + Diabetes and hypothyroidism.  Physical Exam: Vital signs in last 24 hours: Temp:  [97.6 F (36.4 C)-98.6 F (37 C)]  97.8 F (36.6 C) (12/04 0617) Pulse Rate:  [59-74] 65 (12/04 0617) Resp:  [15-25] 17 (12/04 0617) BP: (98-143)/(38-87) 116/49 (12/04 0617) SpO2:  [96 %-100 %] 96 % (  12/04 0545)   General: Alert 89 year old female in no acute distress. Head:  Normocephalic and atraumatic. Eyes:  No scleral icterus. Conjunctiva pink. Ears:  Normal auditory acuity. Nose:  No deformity, discharge or lesions. Mouth: Upper bridge intact.  No ulcers or lesions.  Neck:  Supple. No lymphadenopathy or thyromegaly.  Lungs: Breath sounds clear throughout. No wheezes, rhonchi or crackles.  Oxygen 2 L nasal cannula. Heart: Irregular rhythm, mechanical valve click. Abdomen: Soft, nondistended.  Nontender.  Positive bowel sounds all 4 quadrants.  No bruit.  No palpable mass. Rectal: Moderate amount of solid brown stool in the rectal vault.  No bright red blood or melena.  ED RN at the bedside at time of exam. Musculoskeletal:  Symmetrical without gross deformities.  Pulses:  Normal pulses noted. Extremities: Bilateral lower extremities with edema, RLE 2+ pitting edema with pink erythema, LLE 1+ edema Neurologic:  Alert and  oriented  to name, stated she was at [facility].  Skin:  Intact without significant lesions or rashes. Psych:  Alert and cooperative. Normal mood and affect.  Intake/Output from previous day: 12/03 0701 - 12/04 0700 In: 1077.4 [I.V.:48.1; Blood:1029; IV Piggyback:0.3] Out: -  Intake/Output this shift: No intake/output data recorded.  Lab Results: Recent Labs    08/25/23 1859 08/26/23 0022 08/26/23 0324  WBC 4.7 4.2 4.2  HGB 6.3* 7.7* 9.0*  HCT 21.7* 25.5* 28.7*  PLT 199 176 181   BMET Recent Labs    08/25/23 1859 08/26/23 0324  NA 135 136  K 4.5 4.6  CL 106 108  CO2 19* 19*  GLUCOSE 117* 102*  BUN 68* 65*  CREATININE 2.77* 2.55*  CALCIUM 8.8* 8.6*   LFT Recent Labs    08/25/23 1859  PROT 5.9*  ALBUMIN 3.1*  AST 60*  ALT 51*  ALKPHOS 64  BILITOT 0.7    PT/INR Recent Labs    08/25/23 1859 08/26/23 0324  LABPROT 34.2* 29.1*  INR 3.4* 2.7*   Hepatitis Panel No results for input(s): "HEPBSAG", "HCVAB", "HEPAIGM", "HEPBIGM" in the last 72 hours.    Studies/Results: No results found.  IMPRESSION/PLAN:  89 year old female with a history of  GI bleed secondary to H. Pylori gastric ulcers and small bowel AVMs 2021 admitted to the hospital 08/25/2023 with symptomatic anemia. Reported rectal bleeding per SNF staff. FOBT positive.  Rectal exam today showed solid brown stool.  On PPI IV and Octreotide infusion.  Hemodynamically stable. -NPO -EGD today with Dr. Adela Lank, risks discussed  including risk with sedation, risk of bleeding, perforation and infection. I spoke to the patient husband via telephone and discussed enteroscopy procedure benefits and risks in full detail, he provided consent. -Continue PPI IV twice daily -Continue Octreotide infusion -Check H&H every 6 hours x 24 hours -Transfuse for hemoglobin level less than 8 -Further GI recommendations to be determined after enteroscopy completed  Elevated LFTs.  CTAP 09/2022 showed multiple large gallstones without biliary ductal dilatation with a normal liver.  Mild LFTs possibly due to Amiodarone. -Hepatic panel in a.m. -Consider RUQ sonogram during this hospital admission  Paroxysmal atrial fibrillation on Amiodarone and Warfarin -Continue to hold warfarin  CHF. LV EF 65%.  CAD s/p CABG 1990  S/P AVR (mechanical valve) on Warfarin.  Supratherapeutic INR. Received 2 units of FFP.  -Continue to hold Warfarin   Arnaldo Natal  08/26/2023, 9:06AM

## 2023-08-26 NOTE — Progress Notes (Signed)
Patient arrived at the unit,CHG bath given,CCMD notified,vitals checked, pt is sleepy,pt oriented to the unit

## 2023-08-26 NOTE — Op Note (Addendum)
[facility] Patient Name: [name] Procedure Date : 08/26/2023 MRN: [id number] Attending MD: Willaim Rayas. Adela Lank , MD, [id number] Date of Birth: 01-Sep-1933 CSN: [id number] Age: 89 Admit Type: Inpatient Procedure:                Small bowel enteroscopy Indications:              Melena - reported - supratherapeutic INR on                            coumadin, history of gastric ulcers and gastric /                            small bowel AVMs, anemia. INR now 2.7 Providers:                Viviann Spare P. Adela Lank, MD, Jasmine Pang, RN,                            Priscella Mann, Technician Referring MD:              Medicines:                Monitored Anesthesia Care Complications:            No immediate complications. Estimated blood loss:                            Minimal. Estimated Blood Loss:     Estimated blood loss was minimal. Procedure:                Pre-Anesthesia Assessment:                           - Prior to the procedure, a History and Physical                            was performed, and patient medications and                            allergies were reviewed. The patient's tolerance of                            previous anesthesia was also reviewed. The risks                            and benefits of the procedure and the sedation                            options and risks were discussed with the patient.                            All questions were answered, and informed consent                            was obtained. Prior Anticoagulants: The patient has  taken Coumadin (warfarin), last dose was 1 day                            prior to procedure. ASA Grade Assessment: IV - A                            patient with severe systemic disease that is a                            constant threat to life. After reviewing the risks                            and benefits, the patient was deemed in                             satisfactory condition to undergo the procedure.                           After obtaining informed consent, the endoscope was                            passed under direct vision. Throughout the                            procedure, the patient's blood pressure, pulse, and                            oxygen saturations were monitored continuously. The                            PCF-H190TL ([id number] Olympus slim colonoscope was                            introduced through the mouth and advanced to the                            proximal jejunum. The small bowel enteroscopy was                            accomplished without difficulty. The patient                            tolerated the procedure well. Scope In: Scope Out: Findings:      Esophagogastric landmarks were identified: the Z-line was found at 38       cm, the gastroesophageal junction was found at 38 cm and the upper       extent of the gastric folds was found at 38 cm from the incisors.      The exam of the esophagus was otherwise normal.      Three diminutive angiodysplastic lesions with no bleeding were found in       the gastric antrum. Fulguration to ablate the lesion to prevent bleeding       by argon plasma was successful.      The exam  of the stomach was otherwise normal. No stigmata for bleeding,       no heme noted anywhere.      There was no evidence of significant pathology in the entire examined       duodenum. No heme.      Exam of the jejunum was otherwise normal. Suspected tattoo seen in the       jejunum. No heme. Impression:               - Esophagogastric landmarks identified.                           - Esophagus otherwise normal.                           - Three non-bleeding angiodysplastic lesions in the                            stomach. Treated with argon plasma coagulation                            (APC).                           - Normal stomach otherwise.                           -  Normal examined duodenum.                           - Normal jejunum.                           While diminutive AVMs seen in the stomach and                            ablated, no heme or stigmata for bleeding noted                            anywhere in her bowel on this exam. Suspect distal                            small bowel or colonic bleed. She has small bowel                            AVMs on prior capsule endoscopy.                           I had a lengthy discussion about findings. For her                            to do colonoscopy or capsule endoscopy she would                            need to drink prep - he does not think she will be  willing to do that. For now he would prefer                            observation and keeping INR at lowest limit                            possible and monitor for recurrent bleeding. Recommendation:           - Return patient to hospital ward for ongoing care.                           - Clear liquid diet.                           - Continue present medications.                           - Hold Coumadin                           - Monitor Hgb and for recurrent bleeding                           - Will discuss with the patient / family further if                            they are willing to do a colonoscopy or capsule                            endoscopy, if she can handle bowel prep, which is                            next step in her evaluation. As of right now,                            husband wants to hold off on further procedures                            while we await her course Procedure Code(s):        --- Professional ---                           [phone], Small intestinal endoscopy, enteroscopy                            beyond second portion of duodenum, not including                            ileum; with control of bleeding (eg, injection,                            bipolar cautery,  unipolar cautery, laser, heater  probe, stapler, plasma coagulator) Diagnosis Code(s):        --- Professional ---                           K31.819, Angiodysplasia of stomach and duodenum                            without bleeding                           K92.1, Melena (includes Hematochezia) CPT copyright 2022 [employer]. All rights reserved. The codes documented in this report are preliminary and upon coder review may  be revised to meet current compliance requirements. Viviann Spare P. Carrington Mullenax, MD 08/26/2023 3:50:55 PM This report has been signed electronically. Number of Addenda: 0

## 2023-08-26 NOTE — Anesthesia Procedure Notes (Signed)
Procedure Name: MAC Date/Time: 08/26/2023 3:16 PM  Performed by: Floydene Flock, CRNAPre-anesthesia Checklist: Patient identified, Emergency Drugs available, Suction available, Patient being monitored and Timeout performed Patient Re-evaluated:Patient Re-evaluated prior to induction Oxygen Delivery Method: Nasal cannula Preoxygenation: Pre-oxygenation with 100% oxygen Induction Type: IV induction Placement Confirmation: positive ETCO2

## 2023-08-26 NOTE — ED Notes (Signed)
Pt transported to endoscopy for procedure.

## 2023-08-26 NOTE — Anesthesia Preprocedure Evaluation (Addendum)
Anesthesia Evaluation  Patient identified by MRN, date of birth, ID band Patient awake    Reviewed: Allergy & Precautions, NPO status , Patient's Chart, lab work & pertinent test results  History of Anesthesia Complications Negative for: history of anesthetic complications  Airway Mallampati: III  TM Distance: >3 FB Neck ROM: Full   Comment: Previous grade I view with Miller 3, easy mask Dental  (+) Partial Upper, Dental Advisory Given   Pulmonary neg pulmonary ROS   Pulmonary exam normal breath sounds clear to auscultation       Cardiovascular hypertension, (-) angina + CAD, + Past MI, + Cardiac Stents, + CABG and +CHF  + dysrhythmias (on amiodarone) Atrial Fibrillation + Valvular Problems/Murmurs (s/p AVR)  Rhythm:Regular Rate:Normal  Carotid stenosis, HLD  TTE 12/01/2018: IMPRESSIONS    1. The left ventricle has hyperdynamic systolic function, with an  ejection fraction of >65%. The cavity size was normal.   2. The right ventricle has normal systolc function. The cavity was  normal. There is no increase in right ventricular wall thickness.   3. Mild mitral valve prolapse.   4. Mitral valve regurgitation is mild to moderate by color flow Doppler.   5. Tricuspid valve regurgitation is moderate.   6. Aortic valve regurgitation mild perivalvular leak.   7. Pulmonic valve regurgitation was not assessed by color flow Doppler.   8. There is evidence of plaque in the descending aorta.   9. No vegetations, no endocarditis.  10. - TAVR: TAVR valve with mild perivalvular leak. No vegetations.   LHC 04/12/2015:  Severe native three-vessel coronary artery disease. Patent LIMA to LAD. Patent SVG to diagonal. Patent native RCA which feeds collaterals to the circumflex system.  SVG to OM/PDA jump graft occluded proximally. The second half of this graft is open. Therefore, flow is going from the native RCA, and then retrograde into the  vein graft which feeds the obtuse marginal.  Patent stents in the RCA and posterior lateral artery.  Culprit lesion for her symptoms was the new lesion in the ostium of the RCA. This was successfully treated with a 3.5 x 16 Synergy drug-eluting stent, postdilated to greater than 4 mm in diameter.  Ost RCA lesion, 75% stenosed. There is a 0% residual stenosis post intervention. The lesion was not previously treated.  A drug-eluting stent was placed.       Neuro/Psych negative neurological ROS     GI/Hepatic PUD,,,  Endo/Other  diabetes, Type 2    Renal/GU CRFRenal disease     Musculoskeletal  (+) Arthritis ,    Abdominal   Peds  Hematology  (+) Blood dyscrasia, anemia Lab Results      Component                Value               Date                      WBC                      4.2                 08/26/2023                HGB                      9.0 (L)  08/26/2023                HCT                      28.7 (L)            08/26/2023                MCV                      96.6                08/26/2023                PLT                      181                 08/26/2023              Anesthesia Other Findings 89 y.o. female with a past medical history of arthritis, hypertension, hyperlipidemia, coronary artery disease s/p CABG 1990, chronic diastolic CHF, paroxysmal atrial fibrillation, s/p AVR (mechanical valve) on Warfarin 1993, carotid artery disease, CKD stage IV, diabetes mellitus type 2, cellulitis, macrocytic anemia, GI bleed secondary to H. 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 Husband noted patient was passing black stools for a few days and a hemoglobin level was 5.9. Transfused 2 units.  On warfarin. INR 2.7.  Reproductive/Obstetrics                             Anesthesia Physical Anesthesia Plan  ASA: 4  Anesthesia Plan: MAC   Post-op Pain Management:    Induction: Intravenous  PONV Risk  Score and Plan: 2 and Propofol infusion and TIVA  Airway Management Planned: Natural Airway and Simple Face Mask  Additional Equipment:   Intra-op Plan:   Post-operative Plan:   Informed Consent: I have reviewed the patients History and Physical, chart, labs and discussed the procedure including the risks, benefits and alternatives for the proposed anesthesia with the patient or authorized representative who has indicated his/her understanding and acceptance.     Dental advisory given  Plan Discussed with: CRNA and Anesthesiologist  Anesthesia Plan Comments: (Discussed with patient risks of MAC including, but not limited to, minor pain or discomfort, hearing people in the room, and possible need for backup general anesthesia. Risks for general anesthesia also discussed including, but not limited to, sore throat, hoarse voice, chipped/damaged teeth, injury to vocal cords, nausea and vomiting, allergic reactions, lung infection, heart attack, stroke, and death. All questions answered. )        Anesthesia Quick Evaluation

## 2023-08-27 ENCOUNTER — Inpatient Hospital Stay (HOSPITAL_COMMUNITY): Payer: Medicare Other

## 2023-08-27 DIAGNOSIS — Z7901 Long term (current) use of anticoagulants: Secondary | ICD-10-CM | POA: Diagnosis not present

## 2023-08-27 DIAGNOSIS — R748 Abnormal levels of other serum enzymes: Secondary | ICD-10-CM | POA: Diagnosis not present

## 2023-08-27 DIAGNOSIS — K625 Hemorrhage of anus and rectum: Secondary | ICD-10-CM | POA: Diagnosis not present

## 2023-08-27 DIAGNOSIS — R7989 Other specified abnormal findings of blood chemistry: Secondary | ICD-10-CM | POA: Diagnosis not present

## 2023-08-27 DIAGNOSIS — I5021 Acute systolic (congestive) heart failure: Secondary | ICD-10-CM

## 2023-08-27 DIAGNOSIS — D649 Anemia, unspecified: Secondary | ICD-10-CM | POA: Diagnosis not present

## 2023-08-27 DIAGNOSIS — K922 Gastrointestinal hemorrhage, unspecified: Secondary | ICD-10-CM | POA: Diagnosis not present

## 2023-08-27 DIAGNOSIS — I480 Paroxysmal atrial fibrillation: Secondary | ICD-10-CM | POA: Diagnosis not present

## 2023-08-27 LAB — ECHOCARDIOGRAM COMPLETE
AR max vel: 0.5 cm2
AV Area VTI: 0.61 cm2
AV Area mean vel: 0.55 cm2
AV Mean grad: 8 mm[Hg]
AV Peak grad: 15.2 mm[Hg]
Ao pk vel: 1.95 m/s
Area-P 1/2: 3.11 cm2
Calc EF: 63.1 %
Height: 60 in
MV VTI: 0.74 cm2
S' Lateral: 3.1 cm
Single Plane A2C EF: 61.4 %
Single Plane A4C EF: 62.6 %
Weight: 2532.64 [oz_av]

## 2023-08-27 LAB — BPAM FFP
Blood Product Expiration Date: 202412082359
Blood Product Unit Number: 202412082359
ISSUE DATE / TIME: 202412040426
PRODUCT CODE: 202412040243
PRODUCT CODE: 202412082359
Unit Type and Rh: 5100
Unit Type and Rh: 202412082359
Unit Type and Rh: 5100
Unit Type and Rh: 5100

## 2023-08-27 LAB — TYPE AND SCREEN
ABO/RH(D): O NEG
Antibody Screen: NEGATIVE
Unit division: 0
Unit division: 0

## 2023-08-27 LAB — CBC
HCT: 29.4 % — ABNORMAL LOW (ref 36.0–46.0)
Hemoglobin: 9.2 g/dL — ABNORMAL LOW (ref 12.0–15.0)
MCH: 30 pg (ref 26.0–34.0)
MCHC: 31.3 g/dL (ref 30.0–36.0)
MCV: 95.8 fL (ref 80.0–100.0)
Platelets: 175 10*3/uL (ref 150–400)
RBC: 3.07 MIL/uL — ABNORMAL LOW (ref 3.87–5.11)
RDW: 20.2 % — ABNORMAL HIGH (ref 11.5–15.5)
WBC: 6.5 10*3/uL (ref 4.0–10.5)
nRBC: 0.5 % — ABNORMAL HIGH (ref 0.0–0.2)

## 2023-08-27 LAB — COMPREHENSIVE METABOLIC PANEL
ALT: 119 U/L — ABNORMAL HIGH (ref 0–44)
AST: 171 U/L — ABNORMAL HIGH (ref 15–41)
Albumin: 3 g/dL — ABNORMAL LOW (ref 3.5–5.0)
Alkaline Phosphatase: 57 U/L (ref 38–126)
Anion gap: 13 (ref 5–15)
BUN: 60 mg/dL — ABNORMAL HIGH (ref 8–23)
CO2: 21 mmol/L — ABNORMAL LOW (ref 22–32)
Calcium: 8.8 mg/dL — ABNORMAL LOW (ref 8.9–10.3)
Chloride: 101 mmol/L (ref 98–111)
Creatinine, Ser: 2.42 mg/dL — ABNORMAL HIGH (ref 0.44–1.00)
GFR, Estimated: 19 mL/min — ABNORMAL LOW (ref >60)
Glucose, Bld: 127 mg/dL — ABNORMAL HIGH (ref 70–99)
Potassium: 4.6 mmol/L (ref 3.5–5.1)
Sodium: 135 mmol/L (ref 135–145)
Total Bilirubin: 1.2 mg/dL — ABNORMAL HIGH (ref <1.2)
Total Protein: 5.9 g/dL — ABNORMAL LOW (ref 6.5–8.1)

## 2023-08-27 LAB — BPAM RBC
Blood Product Expiration Date: 202412102359
Blood Product Expiration Date: 202412082359
ISSUE DATE / TIME: 202412032043
ISSUE DATE / TIME: 202412040014
Unit Type and Rh: 202412102359
Unit Type and Rh: 9500
Unit Type and Rh: 9500

## 2023-08-27 LAB — PROTIME-INR
INR: 1.8 — ABNORMAL HIGH (ref 0.8–1.2)
Prothrombin Time: 21 s — ABNORMAL HIGH (ref 11.4–15.2)

## 2023-08-27 LAB — PREPARE FRESH FROZEN PLASMA

## 2023-08-27 MED ORDER — FUROSEMIDE 10 MG/ML IJ SOLN
20.0000 mg | INTRAMUSCULAR | Status: AC
  Administered 2023-08-27: 20 mg via INTRAVENOUS
  Filled 2023-08-27: qty 2

## 2023-08-27 MED ORDER — ALBUMIN HUMAN 25 % IV SOLN
25.0000 g | INTRAVENOUS | Status: AC
  Administered 2023-08-27: 25 g via INTRAVENOUS
  Filled 2023-08-27: qty 100

## 2023-08-27 MED ORDER — WHITE PETROLATUM EX OINT: TOPICAL_OINTMENT | CUTANEOUS | Status: DC | PRN

## 2023-08-27 MED ORDER — WARFARIN - PHARMACIST DOSING INPATIENT: Freq: Every day | Status: DC

## 2023-08-27 MED ORDER — WARFARIN SODIUM 2 MG PO TABS
2.0000 mg | ORAL_TABLET | Freq: Once | ORAL | Status: AC
  Administered 2023-08-27: 2 mg via ORAL
  Filled 2023-08-27: qty 1

## 2023-08-27 MED ORDER — FUROSEMIDE 10 MG/ML IJ SOLN
20.0000 mg | Freq: Two times a day (BID) | INTRAMUSCULAR | Status: DC
  Administered 2023-08-27: 20 mg via INTRAVENOUS
  Filled 2023-08-27 (×2): qty 2

## 2023-08-27 MED ORDER — PANTOPRAZOLE SODIUM 40 MG PO TBEC
40.0000 mg | DELAYED_RELEASE_TABLET | Freq: Two times a day (BID) | ORAL | Status: DC
  Administered 2023-08-27 – 2023-09-02 (×13): 40 mg via ORAL
  Filled 2023-08-27 (×13): qty 1

## 2023-08-27 NOTE — Progress Notes (Signed)
Pt has increased wheezing and SOB Called RT to give breathing treatment. MD made aware.  Pt had incontinence episode and cleaned up. Had BM while coughing and placed on bedpan. PM NT aware that pt will need to be cleaned up and purewick replaced.

## 2023-08-27 NOTE — Progress Notes (Signed)
Progress Note   Subjective  No bleeding reported overnight. Hgb stable. Patient had wheezing overnight given lasix. She states she is feeling better this AM. Husband at bedside.    Objective   Vital signs in last 24 hours: Temp:  [97.5 F (36.4 C)-98 F (36.7 C)] 98 F (36.7 C) (12/05 0803) Pulse Rate:  [61-86] 69 (12/05 0803) Resp:  [13-24] 19 (12/05 0803) BP: (90-148)/(45-79) 94/65 (12/05 0803) SpO2:  [90 %-100 %] 100 % (12/05 0803) Weight:  [68.5 kg-71.8 kg] 71.8 kg (12/04 1647) Last BM Date : 08/25/23 (per patient) General:    white female in NAD Psych:  Cooperative. Normal mood and affect.  Intake/Output from previous day: 12/04 0701 - 12/05 0700 In: 825.3 [P.O.:240; I.V.:385.3; IV Piggyback:200] Out: 750 [Urine:750] Intake/Output this shift: No intake/output data recorded.  Lab Results: Recent Labs    08/26/23 0022 08/26/23 0324 08/26/23 1751 08/27/23 0302  WBC 4.2 4.2  --  6.5  HGB 7.7* 9.0* 9.7* 9.2*  HCT 25.5* 28.7* 31.5* 29.4*  PLT 176 181  --  175   BMET Recent Labs    08/25/23 1859 08/26/23 0324 08/27/23 0302  NA 135 136 135  K 4.5 4.6 4.6  CL 106 108 101  CO2 19* 19* 21*  GLUCOSE 117* 102* 127*  BUN 68* 65* 60*  CREATININE 2.77* 2.55* 2.42*  CALCIUM 8.8* 8.6* 8.8*   LFT Recent Labs    08/27/23 0302  PROT 5.9*  ALBUMIN 3.0*  AST 171*  ALT 119*  ALKPHOS 57  BILITOT 1.2*   PT/INR Recent Labs    08/25/23 1859 08/26/23 0324  LABPROT 34.2* 29.1*  INR 3.4* 2.7*    Studies/Results: DG Chest Port 1 View  Result Date: 08/27/2023 CLINICAL DATA:  89 year old female with pleural effusion. Shortness of breath. EXAM: PORTABLE CHEST 1 VIEW COMPARISON:  Portable chest yesterday and earlier. FINDINGS: Portable AP semi upright view at 0335 hours. Stable cardiomegaly and mediastinal contours. Prior CABG. Small layering right pleural effusion is evident. No superimposed pneumothorax. No consolidation. Pulmonary vascularity appears  regressed, no overt edema. Stable visualized osseous structures. Prior sternotomy. Negative visible bowel gas. IMPRESSION: Small right pleural effusion. Regressed pulmonary edema since yesterday. Electronically Signed   By: Odessa Fleming M.D.   On: 08/27/2023 05:10   DG Chest Port 1V same Day  Result Date: 08/26/2023 CLINICAL DATA:  Shortness of breath. EXAM: PORTABLE CHEST 1 VIEW COMPARISON:  Chest radiograph dated 10/07/2022 FINDINGS: The there is cardiomegaly with vascular congestion. Bibasilar atelectasis. Pneumonia is not excluded. No pneumothorax. Median sternotomy wires and CABG vascular clips. No acute osseous pathology. IMPRESSION: 1. Cardiomegaly with vascular congestion. 2. Bibasilar atelectasis. Electronically Signed   By: Elgie Collard M.D.   On: 08/26/2023 21:43       Assessment / Plan:    89 y/o female here with the following:  GI bleed Anemia Anticoagulated Elevated liver enzymes  Enteroscopy done yesterday - a few diminutive gastric AVMs but no high risk stigmata for bleeding. No heme noted anywhere, I think less likely these were the cause of her bleeding symptoms. She has had suspected small bowel bleeding in the past (numerous small AVMs noted on remote capsule), and likely this may be the source in the setting of supratherapeutic INR. Colonic source also possible. Had discussion with the patient's husband today about if they wanted to pursue colonoscopy or repeat capsule endoscopy. To do either of these would require some bowel prep. He does not  think the patient is up for a prep or any further invasive procedures and currently is declining that. He would prefer for ongoing observation and see if there is no further bleeding if we keep her INR at lowest acceptable range, which I think is reasonable given her comorbidities and age. Should she have recurrent bleeding despite keeping the INR lower, he would be more willing to consider colonoscopy or capsule study. In this light I  think she can eat today, will advance her diet.  Of note, she has had some elevated liver enzymes now at least for the past year that has fluctuated. She does take amiodarone at baseline, unclear if related. They rose overnight, would check a RUQ [place] and trend, further workup with serologies if this persists or worsens.   Moving forward, once outpatient the patient's husband would like to keep a much closer eye on her INR, at least every 2 weeks, which I think is reasonable, to more closely track this and keep at acceptable range.  PLAN: - husband / patient decline bowel prep for any further procedures at this point  - monitor Hgb and for recurrent bleeding today - keep INR at lowest acceptable range while on Coumadin - would check this at least every 2 weeks while outpatient and have close monitoring of Hgb upon discharge - should she had recurrent bleeding while with a lower INR, they would consider colonoscopy  - trend LFTs, check RUQ [place], may need to hold amiodarone  We will sign off for now, please call with questions moving forward.  Harlin Rain, MD [facility]

## 2023-08-27 NOTE — Progress Notes (Addendum)
Concern for CHF exacerbation Patient's nurse reported that patient has audible wheezing.  Per chart review patient has been admitted for GI bleed and hemoglobin is stable so far.  Patient has bilateral wheezing last night which improved with Lasix.  Currently on Lasix 40 mg daily. -Elevated BNP 581. - Checking stat chest x-ray, giving albumin 25 g and Lasix 20 mg IV stat.  Addendum: Chest x-ray showing small right pleural effusion.  Regressed pulmonary edema since yesterday. -Obtaining echocardiogram.    Tereasa Coop, MD [facility] 08/27/2023, 3:33 AM

## 2023-08-27 NOTE — Progress Notes (Signed)
Patient c/o of SOB.  Upon assessment she remains alert and oriented x2 and has audible wheezing and crackle in her bases upon auscultation.  BP 107/55 HR 78 Resp 22 O2 sat 92% on room air.  Dr. Janalyn Shy notified of the above.  New orders placed in [facility] and carried out see Dr. Orest Dikes note for more details.  Patient now in bed with oxygen 2L [place] and reports she feel better after Lasix and an albuterol treatment. BP 113/52 HR 80 Resp 24 O2 sat 99% on 2L [place]  Nursing staff to continue to monitor.

## 2023-08-27 NOTE — Progress Notes (Signed)
  Echocardiogram 2D Echocardiogram has been performed.  Ocie Doyne RDCS 08/27/2023, 1:57 PM

## 2023-08-27 NOTE — Progress Notes (Signed)
PHARMACY - ANTICOAGULATION CONSULT NOTE  Pharmacy Consult for warfarin Indication: mechanical AVR, afib  Allergies  Allergen Reactions   Capzasin [Capsaicin] Other (See Comments)    CAUSED SKIN BURNS THAT LASTED FOR 30 DAYS   Darvon [Propoxyphene] Nausea And Vomiting   Lisinopril Cough   Septra [Sulfamethoxazole-Trimethoprim] Other (See Comments)    Increased INR   Voltaren [Diclofenac] Other (See Comments)    POSSIBLE CHANCE OF INTERNAL BLEEDING MAY RESULT   Warfarin And Related Other (See Comments)    ONLY TOLERATES BRAND   Tobrex [Tobramycin] Other (See Comments)    "Allergic" to this- exact reaction not cited   Trazodone And Nefazodone Other (See Comments)    Severe sweating   Zolpidem Tartrate Other (See Comments)    Hallucinations   Penicillins Rash    Did it involve swelling of the face/tongue/throat, SOB, or low BP? No Did it involve sudden or severe rash/hives, skin peeling, or any reaction on the inside of your mouth or nose? No Did you need to seek medical attention at a hospital or doctor's office? No When did it last happen?  15 years     If all above answers are "NO", may proceed with cephalosporin use.    Patient Measurements: Height: 5' (152.4 cm) Weight: 71.8 kg (158 lb 4.6 oz) IBW/kg (Calculated) : 45.5   Vital Signs: Temp: 98 F (36.7 C) (12/05 0803) Temp Source: Oral (12/05 0803) BP: 132/67 (12/05 1041) Pulse Rate: 77 (12/05 1041)  Labs: Recent Labs    08/25/23 1859 08/26/23 0022 08/26/23 0324 08/26/23 1751 08/27/23 0302 08/27/23 0935  HGB 6.3* 7.7* 9.0* 9.7* 9.2*  --   HCT 21.7* 25.5* 28.7* 31.5* 29.4*  --   PLT 199 176 181  --  175  --   APTT 42*  --  40*  --   --   --   LABPROT 34.2*  --  29.1*  --   --  21.0*  INR 3.4*  --  2.7*  --   --  1.8*  CREATININE 2.77*  --  2.55*  --  2.42*  --     Estimated Creatinine Clearance: 14.2 mL/min (A) (by C-G formula based on SCr of 2.42 mg/dL (H)).   Medical History: Past Medical History:   Diagnosis Date   Anemia    Anginal pain (HCC)    Arteriovenous malformation of gastrointestinal tract    Arthritis    "fingers mostly" (04/12/2015)   Carotid artery disease (HCC)    Carotid US 3/17:  60-79% RICA; 40-59% LICA; Elevated bilateral subclavian artery velocities >>f/u 1 year. // Carotid US 4/18: R 40-59; L 1-39 >> FU 1 year // Carotid US 10/2018: R 40-59; L 1-39, L subclavian stenosis    CHF (congestive heart failure) (HCC)    Chronic kidney disease (CKD), stage III (moderate) (HCC)    Chronic lower GI bleeding    "today; last time was ~ 8 yr ago; used to have them often before that too" (02/11/2013)   Coronary artery disease    DJD (degenerative joint disease)    Dysrhythmia    Heart murmur    History of blood transfusion    "a few times over the years; usually related to my Coumadin" (04/12/2015   History of gout    Hyperlipemia    Hypertension    Macula lutea degeneration    Old MI (myocardial infarction)    "a coulple /dr in 02/2008; I never even knew I'd had them" (04/12/2015)  Type II diabetes mellitus (HCC)    no medications, diet controlled    Medications:  Medications Prior to Admission  Medication Sig Dispense Refill Last Dose   amiodarone (PACERONE) 200 MG tablet Take 1 tablet by mouth once daily (Patient taking differently: Take 200 mg by mouth daily.) 90 tablet 2 08/25/2023   atorvastatin (LIPITOR) 40 MG tablet Take 1 tablet (40 mg total) by mouth daily. (Patient taking differently: Take 40 mg by mouth at bedtime.) 90 tablet 3 Past Week   Bevacizumab (AVASTIN IV) Inject 1 Dose into the vein every 8 (eight) weeks.   UNK   cephALEXin (KEFLEX) 500 MG capsule Take 2,000 mg by mouth as needed. As needed for dental prophylaxis   UNK   clonazePAM (KLONOPIN) 0.5 MG tablet Take 0.5 mg by mouth 2 (two) times daily.   08/25/2023   clonazePAM (KLONOPIN) 0.5 MG tablet Take 0.5 mg by mouth 2 (two) times daily as needed for anxiety.   UNK   Emollient (BAG BALM) OINT Apply 1  Application topically daily. Apply to bilateral lower extremities for dry, scaly skin   08/25/2023   ezetimibe (ZETIA) 10 MG tablet Take 1 tablet by mouth once daily (Patient taking differently: Take 10 mg by mouth at bedtime.) 90 tablet 2 Past Week   FeFum-FePo-FA-B Cmp-C-Zn-Mn-Cu (SE-TAN PLUS) 162-115.2-1 MG CAPS Take 1 capsule by mouth in the morning and at bedtime.   08/25/2023   folic acid (FOLVITE) 400 MCG tablet Take 400 mcg by mouth in the morning.   08/25/2023   furosemide (LASIX) 40 MG tablet Take 40 -80 mg by mouth daily based on renal function (Patient taking differently: Take 40 mg by mouth 2 (two) times daily.) 90 tablet 3 08/25/2023   isosorbide mononitrate (IMDUR) 60 MG 24 hr tablet Take 1 tablet by mouth once daily (Patient taking differently: 60 mg daily.) 90 tablet 2 08/25/2023   levothyroxine (SYNTHROID) 88 MCG tablet Take 88 mcg by mouth at bedtime.   Past Week   losartan (COZAAR) 50 MG tablet Take 50 mg by mouth in the morning.   08/25/2023   memantine (NAMENDA) 5 MG tablet Take 5 mg by mouth 2 (two) times daily.   08/25/2023   Multiple Vitamin (MULTIVITAMIN WITH MINERALS) TABS Take 1 tablet by mouth daily with breakfast.   08/25/2023   Multiple Vitamins-Minerals (PRESERVISION AREDS 2 PO) Take 1 capsule by mouth 2 (two) times daily.   08/25/2023   Nutritional Supplements (BOOST GLUCOSE CONTROL PO) Take 1 Bottle by mouth daily.   08/25/2023   Omega-3 Fatty Acids (FISH OIL PO) Take 700 mg by mouth 2 (two) times daily.   08/25/2023   pantoprazole (PROTONIX) 40 MG tablet Take 40 mg by mouth daily.   Past Week   potassium chloride (MICRO-K) 10 MEQ CR capsule Take 20 mEq by mouth daily.   08/25/2023   sertraline (ZOLOFT) 25 MG tablet Take 25 mg by mouth daily.   08/25/2023   TYLENOL 500 MG tablet Take 500-1,000 mg by mouth every 6 (six) hours as needed for mild pain or headache.   UNK   warfarin (COUMADIN) 1 MG tablet Take 1 mg by mouth 3 (three) times a week. Give 1 tablet every Tues, Thurs, and  Sat at 1700      [START ON 08/28/2023] warfarin (COUMADIN) 2 MG tablet Take 2 mg by mouth 4 (four) times a week. Give 1 tablet every Mon, Wed, Fri, and Sun      warfarin (COUMADIN) 2 MG  tablet Take 2 mg by mouth at bedtime.   Past Week   nitroGLYCERIN (NITROSTAT) 0.4 MG SL tablet DISSOLVE ONE TABLET UNDER THE TONGUE EVERY 5 MINUTES AS NEEDED FOR CHEST PAIN.  DO NOT EXCEED A TOTAL OF 3 DOSES IN 15 MINUTES (Patient taking differently: Place 0.4 mg under the tongue every 5 (five) minutes x 3 doses as needed for chest pain.) 25 tablet 5    oxyCODONE (OXY IR/ROXICODONE) 5 MG immediate release tablet Take 5 mg by mouth every 6 (six) hours as needed for moderate pain or severe pain.      Scheduled:   atorvastatin  40 mg Oral Daily   ezetimibe  10 mg Oral Daily   furosemide  40 mg Oral Daily   levothyroxine  88 mcg Oral Q0600   pantoprazole  40 mg Oral BID   sodium chloride flush  3 mL Intravenous Q12H    Assessment: 36 you female with concern of GIB s/p EGD 12/04 with no bleeding source found. She is on warfarin for mechanical AVR and afib. Pharmacy consulted to resume warfarin  -Hg 6.3 on 12/3 now up to 9.2 -INR= 1.8 -LFTs 171/119  Home warfarin dose: 2mg  MWFSu, 1mg  TTSa   Goal of Therapy:  INE goal 2-2.5; keep closer to 2.0 per MD Monitor platelets by anticoagulation protocol: Yes   Plan:  -Warfarin 2mg  po today -Daily PT/INR  Harland German, PharmD Clinical Pharmacist **Pharmacist phone directory can now be found on [email or web address] ([email or web address]).  Listed under [facility].

## 2023-08-27 NOTE — Anesthesia Postprocedure Evaluation (Signed)
Anesthesia Post Note  Patient: [name]  Procedure(s) Performed: ENTEROSCOPY HOT HEMOSTASIS (ARGON PLASMA COAGULATION/BICAP)     Patient location during evaluation: PACU Anesthesia Type: MAC Level of consciousness: awake and alert Pain management: pain level controlled Vital Signs Assessment: post-procedure vital signs reviewed and stable Respiratory status: spontaneous breathing, nonlabored ventilation and respiratory function stable Cardiovascular status: blood pressure returned to baseline and stable Postop Assessment: no apparent nausea or vomiting Anesthetic complications: no   No notable events documented.  Last Vitals:  Vitals:   08/27/23 1108 08/27/23 1633  BP: (!) 83/55 (!) 134/55  Pulse:  84  Resp:  16  Temp: (!) 36.3 C (!) 36.4 C  SpO2:  99%    Last Pain:  Vitals:   08/27/23 1633  TempSrc: Oral  PainSc:    Pain Goal:                   Lowella Curb

## 2023-08-27 NOTE — Progress Notes (Signed)
[facility]  PROGRESS NOTE  [name] NGE:[id number] DOB: [birth year] DOA: 08/25/2023 PCP: [name]   Brief HPI:   89 y.o. female with medical history significant of "short term memory loss" per the husband, progresing for 6 months.  History obtained from patient over the phone   Patient is a resident of nursing home.  Patient's husband noted that he she was having black stools for the last couple of days and requested that the patient's blood be tested, which revealed severe anemia    Assessment/Plan:   GI bleed -Has history of multiple upper GI bleed in the past -Presented with severe anemia in setting of anticoagulation with warfarin for A-fib as well as aortic mechanical valve -Hemoglobin 6.3; status post blood transfusion -Hemoglobin improved to 9.0  -Underwent enteroscopy yesterday, showed gastric AVM but no high risk stigmata for bleeding.  Family at this time is not interested in getting colonoscopy or repeat capsule endoscopy.  Plan to restart Coumadin. -Continue PPI,  -GI has signed off  Transaminitis -Mild elevation of LFTs -Will obtain abdominal ultrasound -?  Amiodarone associated  Bilateral wheezing -Concern for pulmonary edema, IV fluids were discontinued -Good diuresis with Lasix -BNP elevated 543, chest x-ray showed pulmonary edema -Continue Lasix 40 mg p.o. daily  History of amnesia -Going on for past 6 months as per patient has been -TSH 2.352, B12 2,160  History of mechanical arctic valve -Supratherapeutic INR was reversed with FFP's -Coumadin was held for GI bleed -Coumadin will be restarted as per GI recommendation  Medications     atorvastatin  40 mg Oral Daily   ezetimibe  10 mg Oral Daily   furosemide  40 mg Oral Daily   levothyroxine  88 mcg Oral Q0600   pantoprazole (PROTONIX) IV  40 mg Intravenous Q12H   sodium chloride flush  3 mL Intravenous Q12H     Data Reviewed:   CBG:  No results for  input(s): "GLUCAP" in the last 168 hours.  SpO2: 99 % O2 Flow Rate (L/min): 2 L/min    Vitals:   08/26/23 2330 08/27/23 0330 08/27/23 0355 08/27/23 0400  BP: (!) 111/48 (!) 107/55 116/73 (!) 113/52  Pulse: 62 61 86 80  Resp: 17 (!) 22 (!) 24 (!) 24  Temp: 97.9 F (36.6 C)  97.9 F (36.6 C)   TempSrc: Oral     SpO2: 93% 92% 98% 99%  Weight:      Height:          Data Reviewed:  Basic Metabolic Panel: Recent Labs  Lab 08/25/23 1859 08/26/23 0324 08/27/23 0302  NA 135 136 135  K 4.5 4.6 4.6  CL 106 108 101  CO2 19* 19* 21*  GLUCOSE 117* 102* 127*  BUN 68* 65* 60*  CREATININE 2.77* 2.55* 2.42*  CALCIUM 8.8* 8.6* 8.8*    CBC: Recent Labs  Lab 08/25/23 1859 08/26/23 0022 08/26/23 0324 08/26/23 1751 08/27/23 0302  WBC 4.7 4.2 4.2  --  6.5  HGB 6.3* 7.7* 9.0* 9.7* 9.2*  HCT 21.7* 25.5* 28.7* 31.5* 29.4*  MCV 102.4* 98.8 96.6  --  95.8  PLT 199 176 181  --  175    LFT Recent Labs  Lab 08/25/23 1859 08/27/23 0302  AST 60* 171*  ALT 51* 119*  ALKPHOS 64 57  BILITOT 0.7 1.2*  PROT 5.9* 5.9*  ALBUMIN 3.1* 3.0*     Antibiotics: Anti-infectives (From admission, onward)    Start  Dose/Rate Route Frequency Ordered Stop   08/25/23 2230  cefTRIAXone (ROCEPHIN) 2 g in sodium chloride 0.9 % 100 mL IVPB        2 g 200 mL/hr over 30 Minutes Intravenous Every 24 hours 08/25/23 2111          DVT prophylaxis: SCDs  Code Status: Full code  Family Communication: No family at bedside   CONSULTS gastroenterology   Subjective   Breathing has improved, received Lasix 20 mg IV along with 25 g of albumin this morning.  BNP was elevated to 543, chest x-ray showed pulmonary edema.  Objective    Physical Examination:   General-appears in no acute distress Heart-S1-S2, regular, no murmur auscultated Lungs-bibasilar crackles auscultated Abdomen-soft, nontender, no organomegaly Extremities-no edema in the lower extremities Neuro-alert, oriented x3,  no focal deficit noted   Status is: Inpatient:             [name] S [name]   [facility] If 7PM-7AM, please contact night-coverage at [email or web address], Office  [phone]   08/27/2023, 7:29 AM  LOS: 2 days

## 2023-08-27 NOTE — Plan of Care (Signed)

## 2023-08-27 NOTE — Progress Notes (Signed)
Spoke with husband concerning care. He was concerned that she was not eating much (she does not eat much at home). I spoke to pt and ordered potato soup and pudding with milk as well. I fed pt as husband did not feel comfortable. Will continue to encourage intake. Explained that we would place bed alarm and continue to monitor patient as we keep alarm off generally when family at bedside. Pt resting with call bell within reach.  Will continue to monitor.

## 2023-08-28 ENCOUNTER — Other Ambulatory Visit: Payer: Self-pay

## 2023-08-28 DIAGNOSIS — Z7901 Long term (current) use of anticoagulants: Secondary | ICD-10-CM | POA: Diagnosis not present

## 2023-08-28 DIAGNOSIS — R748 Abnormal levels of other serum enzymes: Secondary | ICD-10-CM | POA: Diagnosis not present

## 2023-08-28 DIAGNOSIS — K922 Gastrointestinal hemorrhage, unspecified: Secondary | ICD-10-CM | POA: Diagnosis not present

## 2023-08-28 LAB — BASIC METABOLIC PANEL
Anion gap: 11 (ref 5–15)
BUN: 54 mg/dL — ABNORMAL HIGH (ref 8–23)
CO2: 23 mmol/L (ref 22–32)
Calcium: 8.6 mg/dL — ABNORMAL LOW (ref 8.9–10.3)
Chloride: 103 mmol/L (ref 98–111)
Creatinine, Ser: 1.98 mg/dL — ABNORMAL HIGH (ref 0.44–1.00)
GFR, Estimated: 24 mL/min — ABNORMAL LOW (ref >60)
Glucose, Bld: 110 mg/dL — ABNORMAL HIGH (ref 70–99)
Potassium: 3.5 mmol/L (ref 3.5–5.1)
Sodium: 137 mmol/L (ref 135–145)

## 2023-08-28 LAB — PROTIME-INR
INR: 2.6 — ABNORMAL HIGH (ref 0.8–1.2)
Prothrombin Time: 28 s — ABNORMAL HIGH (ref 11.4–15.2)

## 2023-08-28 MED ORDER — IPRATROPIUM-ALBUTEROL 0.5-2.5 (3) MG/3ML IN SOLN
3.0000 mL | Freq: Four times a day (QID) | RESPIRATORY_TRACT | Status: DC
  Administered 2023-08-28 – 2023-08-29 (×5): 3 mL via RESPIRATORY_TRACT
  Filled 2023-08-28 (×6): qty 3

## 2023-08-28 MED ORDER — METHYLPREDNISOLONE SODIUM SUCC 40 MG IJ SOLR
40.0000 mg | Freq: Two times a day (BID) | INTRAMUSCULAR | Status: DC
  Administered 2023-08-28 – 2023-08-31 (×7): 40 mg via INTRAVENOUS
  Filled 2023-08-28 (×7): qty 1

## 2023-08-28 MED ORDER — GUAIFENESIN ER 600 MG PO TB12
600.0000 mg | ORAL_TABLET | Freq: Two times a day (BID) | ORAL | Status: DC
  Administered 2023-08-28 – 2023-09-02 (×11): 600 mg via ORAL
  Filled 2023-08-28 (×11): qty 1

## 2023-08-28 NOTE — Progress Notes (Addendum)
PHARMACY - ANTICOAGULATION CONSULT NOTE  Pharmacy Consult for warfarin Indication: mechanical AVR, afib  Allergies  Allergen Reactions   Capzasin [Capsaicin] Other (See Comments)    CAUSED SKIN BURNS THAT LASTED FOR 30 DAYS   Darvon [Propoxyphene] Nausea And Vomiting   Lisinopril Cough   Septra [Sulfamethoxazole-Trimethoprim] Other (See Comments)    Increased INR   Voltaren [Diclofenac] Other (See Comments)    POSSIBLE CHANCE OF INTERNAL BLEEDING MAY RESULT   Warfarin And Related Other (See Comments)    ONLY TOLERATES BRAND   Tobrex [Tobramycin] Other (See Comments)    "Allergic" to this- exact reaction not cited   Trazodone And Nefazodone Other (See Comments)    Severe sweating   Zolpidem Tartrate Other (See Comments)    Hallucinations   Penicillins Rash    Did it involve swelling of the face/tongue/throat, SOB, or low BP? No Did it involve sudden or severe rash/hives, skin peeling, or any reaction on the inside of your mouth or nose? No Did you need to seek medical attention at a hospital or doctor's office? No When did it last happen?  15 years     If all above answers are "NO", may proceed with cephalosporin use.    Patient Measurements: Height: 5' (152.4 cm) Weight: 68.6 kg (151 lb 3.8 oz) IBW/kg (Calculated) : 45.5   Vital Signs: Temp: 98.1 F (36.7 C) (12/06 0838) Temp Source: Oral (12/06 0838) BP: 92/78 (12/06 0838) Pulse Rate: 80 (12/06 0549)  Labs: Recent Labs    08/25/23 1859 08/26/23 0022 08/26/23 0324 08/26/23 1751 08/27/23 0302 08/27/23 0935 08/28/23 0313 08/28/23 0918  HGB 6.3* 7.7* 9.0* 9.7* 9.2*  --   --   --   HCT 21.7* 25.5* 28.7* 31.5* 29.4*  --   --   --   PLT 199 176 181  --  175  --   --   --   APTT 42*  --  40*  --   --   --   --   --   LABPROT 34.2*  --  29.1*  --   --  21.0*  --  28.0*  INR 3.4*  --  2.7*  --   --  1.8*  --  2.6*  CREATININE 2.77*  --  2.55*  --  2.42*  --  1.98*  --     Estimated Creatinine Clearance: 17  mL/min (A) (by C-G formula based on SCr of 1.98 mg/dL (H)).   Medical History: Past Medical History:  Diagnosis Date   Anemia    Anginal pain (HCC)    Arteriovenous malformation of gastrointestinal tract    Arthritis    "fingers mostly" (04/12/2015)   Carotid artery disease (HCC)    Carotid US 3/17:  60-79% RICA; 40-59% LICA; Elevated bilateral subclavian artery velocities >>f/u 1 year. // Carotid US 4/18: R 40-59; L 1-39 >> FU 1 year // Carotid US 10/2018: R 40-59; L 1-39, L subclavian stenosis    CHF (congestive heart failure) (HCC)    Chronic kidney disease (CKD), stage III (moderate) (HCC)    Chronic lower GI bleeding    "today; last time was ~ 8 yr ago; used to have them often before that too" (02/11/2013)   Coronary artery disease    DJD (degenerative joint disease)    Dysrhythmia    Heart murmur    History of blood transfusion    "a few times over the years; usually related to my Coumadin" (04/12/2015   History  of gout    Hyperlipemia    Hypertension    Macula lutea degeneration    Old MI (myocardial infarction)    "a coulple /dr in 02/2008; I never even knew I'd had them" (04/12/2015)   Type II diabetes mellitus (HCC)    no medications, diet controlled    Medications:  Medications Prior to Admission  Medication Sig Dispense Refill Last Dose   amiodarone (PACERONE) 200 MG tablet Take 1 tablet by mouth once daily (Patient taking differently: Take 200 mg by mouth daily.) 90 tablet 2 08/25/2023   atorvastatin (LIPITOR) 40 MG tablet Take 1 tablet (40 mg total) by mouth daily. (Patient taking differently: Take 40 mg by mouth at bedtime.) 90 tablet 3 Past Week   Bevacizumab (AVASTIN IV) Inject 1 Dose into the vein every 8 (eight) weeks.   UNK   cephALEXin (KEFLEX) 500 MG capsule Take 2,000 mg by mouth as needed. As needed for dental prophylaxis   UNK   clonazePAM (KLONOPIN) 0.5 MG tablet Take 0.5 mg by mouth 2 (two) times daily.   08/25/2023   clonazePAM (KLONOPIN) 0.5 MG tablet  Take 0.5 mg by mouth 2 (two) times daily as needed for anxiety.   UNK   Emollient (BAG BALM) OINT Apply 1 Application topically daily. Apply to bilateral lower extremities for dry, scaly skin   08/25/2023   ezetimibe (ZETIA) 10 MG tablet Take 1 tablet by mouth once daily (Patient taking differently: Take 10 mg by mouth at bedtime.) 90 tablet 2 Past Week   FeFum-FePo-FA-B Cmp-C-Zn-Mn-Cu (SE-TAN PLUS) 162-115.2-1 MG CAPS Take 1 capsule by mouth in the morning and at bedtime.   08/25/2023   folic acid (FOLVITE) 400 MCG tablet Take 400 mcg by mouth in the morning.   08/25/2023   furosemide (LASIX) 40 MG tablet Take 40 -80 mg by mouth daily based on renal function (Patient taking differently: Take 40 mg by mouth 2 (two) times daily.) 90 tablet 3 08/25/2023   isosorbide mononitrate (IMDUR) 60 MG 24 hr tablet Take 1 tablet by mouth once daily (Patient taking differently: 60 mg daily.) 90 tablet 2 08/25/2023   levothyroxine (SYNTHROID) 88 MCG tablet Take 88 mcg by mouth at bedtime.   Past Week   losartan (COZAAR) 50 MG tablet Take 50 mg by mouth in the morning.   08/25/2023   memantine (NAMENDA) 5 MG tablet Take 5 mg by mouth 2 (two) times daily.   08/25/2023   Multiple Vitamin (MULTIVITAMIN WITH MINERALS) TABS Take 1 tablet by mouth daily with breakfast.   08/25/2023   Multiple Vitamins-Minerals (PRESERVISION AREDS 2 PO) Take 1 capsule by mouth 2 (two) times daily.   08/25/2023   Nutritional Supplements (BOOST GLUCOSE CONTROL PO) Take 1 Bottle by mouth daily.   08/25/2023   Omega-3 Fatty Acids (FISH OIL PO) Take 700 mg by mouth 2 (two) times daily.   08/25/2023   pantoprazole (PROTONIX) 40 MG tablet Take 40 mg by mouth daily.   Past Week   potassium chloride (MICRO-K) 10 MEQ CR capsule Take 20 mEq by mouth daily.   08/25/2023   sertraline (ZOLOFT) 25 MG tablet Take 25 mg by mouth daily.   08/25/2023   TYLENOL 500 MG tablet Take 500-1,000 mg by mouth every 6 (six) hours as needed for mild pain or headache.   UNK    warfarin (COUMADIN) 1 MG tablet Take 1 mg by mouth 3 (three) times a week. Give 1 tablet every Tues, Thurs, and Sat at 1700  warfarin (COUMADIN) 2 MG tablet Take 2 mg by mouth 4 (four) times a week. Give 1 tablet every Mon, Wed, Fri, and Sun      warfarin (COUMADIN) 2 MG tablet Take 2 mg by mouth at bedtime.   Past Week   nitroGLYCERIN (NITROSTAT) 0.4 MG SL tablet DISSOLVE ONE TABLET UNDER THE TONGUE EVERY 5 MINUTES AS NEEDED FOR CHEST PAIN.  DO NOT EXCEED A TOTAL OF 3 DOSES IN 15 MINUTES (Patient taking differently: Place 0.4 mg under the tongue every 5 (five) minutes x 3 doses as needed for chest pain.) 25 tablet 5    oxyCODONE (OXY IR/ROXICODONE) 5 MG immediate release tablet Take 5 mg by mouth every 6 (six) hours as needed for moderate pain or severe pain.      Scheduled:   atorvastatin  40 mg Oral Daily   ezetimibe  10 mg Oral Daily   guaiFENesin  600 mg Oral BID   ipratropium-albuterol  3 mL Nebulization Q6H   levothyroxine  88 mcg Oral Q0600   methylPREDNISolone (SOLU-MEDROL) injection  40 mg Intravenous Q12H   pantoprazole  40 mg Oral BID   sodium chloride flush  3 mL Intravenous Q12H   Warfarin - Pharmacist Dosing Inpatient   Does not apply q1600    Assessment: 40 you female with concern of GIB s/p EGD 12/04 with no bleeding source found. She is on warfarin for mechanical AVR and afib. Pharmacy consulted to resume warfarin  -Hg 6.3 on 12/3 now up to 9.2 -INR= 1.8> 2.6 after 5mg  x1 on 12/5 -LFTs 171/119 on 12/5  Home warfarin dose: 2mg  MWFSu, 1mg  TTSa   Goal of Therapy:  INE goal 2-2.5; keep closer to 2.0 per MD Monitor platelets by anticoagulation protocol: Yes   Plan:  -Hold warfarin due to INR trend up (possibly due to liver dysfunction and antibiotics) -Daily PT/INR -Liver function panel and CBC in am  Harland German, PharmD Clinical Pharmacist **Pharmacist phone directory can now be found on [email or web address] ([email or web address]).  Listed under [facility].

## 2023-08-28 NOTE — Plan of Care (Signed)

## 2023-08-28 NOTE — Progress Notes (Signed)
Patient has had poor PO intake last two days.  She is only taking minimal bites of food and attempted but disliked the taste of Ensure.  Patient's spouse and staff have encouraged PO intake multiple times today with little success.  Patient states "I don't want anything" or "I'm not hungry".   Patient presented with multiple options for breakfast, lunch and dinner.

## 2023-08-28 NOTE — Care Management Important Message (Signed)
Important Message  Patient Details  Name: ANALAH CARLS MRN: [id number] Date of Birth: 03-24-1933   Important Message Given:  Yes - Medicare IM     Sherilyn Banker 08/28/2023, 2:55 PM

## 2023-08-28 NOTE — Progress Notes (Signed)
[facility]  PROGRESS NOTE  SULEY KINCADE VWU:[id number] DOB: [birth year] DOA: 08/25/2023 PCP: [facility]   Brief HPI:   89 y.o. female with medical history significant of "short term memory loss" per the husband, progresing for 6 months.  History obtained from patient over the phone   Patient is a resident of nursing home.  Patient's husband noted that he she was having black stools for the last couple of days and requested that the patient's blood be tested, which revealed severe anemia    Assessment/Plan:   GI bleed -Has history of multiple upper GI bleed in the past -Presented with severe anemia in setting of anticoagulation with warfarin for A-fib as well as aortic mechanical valve -Hemoglobin 6.3; status post blood transfusion -Hemoglobin improved to 9.0  -Underwent enteroscopy yesterday, showed gastric AVM but no high risk stigmata for bleeding.  Family at this time is not interested in getting colonoscopy or repeat capsule endoscopy.  Plan to restart Coumadin. -Continue PPI,  -GI has signed off  Transaminitis -Mild elevation of LFTs -Abdominal ultrasound shows cholelithiasis and possible liver cirrhosis -?  Amiodarone associated -Follow LFTs in a.m.  Bilateral wheezing/pulmonary edema/acute bronchitis -Concern for pulmonary edema, IV fluids were discontinued -Chest x-ray showed pulmonary edema and small pleural effusion -BNP was 543, started on Lasix 20 mg IV twice daily -Patient is currently on hold due to soft blood pressure -Will also start DuoNebs every 6 hours scheduled and Solu-Medrol 40 mg IV twice daily, Mucinex 600 mg p.o. twice daily   CKD stage IIIb/IV -Patient's creatinine improving with diuresis, today creatinine is 1.98 -Lasix on hold due to soft BP -Unclear baseline, last creatinine from January 2024 was 1.52 -Follow BMP in am  History of amnesia -Going on for past 6 months as per patient has been -TSH 2.352, B12  2,160  History of mechanical arctic valve -Supratherapeutic INR was reversed with FFP's -Coumadin was held for GI bleed -Coumadin will be restarted as per GI recommendation  Medications     atorvastatin  40 mg Oral Daily   ezetimibe  10 mg Oral Daily   furosemide  20 mg Intravenous BID   levothyroxine  88 mcg Oral Q0600   pantoprazole  40 mg Oral BID   sodium chloride flush  3 mL Intravenous Q12H   Warfarin - Pharmacist Dosing Inpatient   Does not apply q1600     Data Reviewed:   CBG:  No results for input(s): "GLUCAP" in the last 168 hours.  SpO2: 92 % O2 Flow Rate (L/min): 2 L/min    Vitals:   08/28/23 0317 08/28/23 0318 08/28/23 0549 08/28/23 0600  BP: (!) 109/56     Pulse: 83  80   Resp: (!) 22  (!) 21   Temp: 98.2 F (36.8 C)     TempSrc: Oral     SpO2: 99%  93% 92%  Weight:  68.6 kg    Height:          Data Reviewed:  Basic Metabolic Panel: Recent Labs  Lab 08/25/23 1859 08/26/23 0324 08/27/23 0302 08/28/23 0313  NA 135 136 135 137  K 4.5 4.6 4.6 3.5  CL 106 108 101 103  CO2 19* 19* 21* 23  GLUCOSE 117* 102* 127* 110*  BUN 68* 65* 60* 54*  CREATININE 2.77* 2.55* 2.42* 1.98*  CALCIUM 8.8* 8.6* 8.8* 8.6*    CBC: Recent Labs  Lab 08/25/23 1859 08/26/23 0022 08/26/23 0324 08/26/23 1751 08/27/23 0302  WBC 4.7 4.2 4.2  --  6.5  HGB 6.3* 7.7* 9.0* 9.7* 9.2*  HCT 21.7* 25.5* 28.7* 31.5* 29.4*  MCV 102.4* 98.8 96.6  --  95.8  PLT 199 176 181  --  175    LFT Recent Labs  Lab 08/25/23 1859 08/27/23 0302  AST 60* 171*  ALT 51* 119*  ALKPHOS 64 57  BILITOT 0.7 1.2*  PROT 5.9* 5.9*  ALBUMIN 3.1* 3.0*     Antibiotics: Anti-infectives (From admission, onward)    Start     Dose/Rate Route Frequency Ordered Stop   08/25/23 2230  cefTRIAXone (ROCEPHIN) 2 g in sodium chloride 0.9 % 100 mL IVPB        2 g 200 mL/hr over 30 Minutes Intravenous Every 24 hours 08/25/23 2111          DVT prophylaxis: SCDs  Code Status: Full  code  Family Communication: No family at bedside   CONSULTS gastroenterology   Subjective   Still having expiratory wheezes.  Diuresed with Lasix but blood pressure is low this morning.  Lasix held.  Objective    Physical Examination:   General-appears in no acute distress Heart-S1-S2, regular, no murmur auscultated Lungs-bilateral wheezing auscultated Abdomen-soft, nontender, no organomegaly Extremities-no edema in the lower extremities Neuro-alert, oriented x3, no focal deficit noted   Status is: Inpatient:          Ahmya Bernick S Hasina Kreager   [facility] If 7PM-7AM, please contact night-coverage at [email or web address], Office  [phone]   08/28/2023, 8:10 AM  LOS: 3 days

## 2023-08-29 DIAGNOSIS — Z7901 Long term (current) use of anticoagulants: Secondary | ICD-10-CM | POA: Diagnosis not present

## 2023-08-29 DIAGNOSIS — R748 Abnormal levels of other serum enzymes: Secondary | ICD-10-CM | POA: Diagnosis not present

## 2023-08-29 DIAGNOSIS — K922 Gastrointestinal hemorrhage, unspecified: Secondary | ICD-10-CM | POA: Diagnosis not present

## 2023-08-29 LAB — HEPATIC FUNCTION PANEL
ALT: 187 U/L — ABNORMAL HIGH (ref 0–44)
AST: 201 U/L — ABNORMAL HIGH (ref 15–41)
Albumin: 2.9 g/dL — ABNORMAL LOW (ref 3.5–5.0)
Alkaline Phosphatase: 56 U/L (ref 38–126)
Bilirubin, Direct: 0.6 mg/dL — ABNORMAL HIGH (ref 0.0–0.2)
Indirect Bilirubin: 0.7 mg/dL (ref 0.3–0.9)
Total Bilirubin: 1.3 mg/dL — ABNORMAL HIGH (ref <1.2)
Total Protein: 5.7 g/dL — ABNORMAL LOW (ref 6.5–8.1)

## 2023-08-29 LAB — BASIC METABOLIC PANEL
Anion gap: 13 (ref 5–15)
BUN: 53 mg/dL — ABNORMAL HIGH (ref 8–23)
CO2: 22 mmol/L (ref 22–32)
Calcium: 9 mg/dL (ref 8.9–10.3)
Chloride: 100 mmol/L (ref 98–111)
Creatinine, Ser: 1.71 mg/dL — ABNORMAL HIGH (ref 0.44–1.00)
GFR, Estimated: 28 mL/min — ABNORMAL LOW (ref >60)
Glucose, Bld: 172 mg/dL — ABNORMAL HIGH (ref 70–99)
Potassium: 3.9 mmol/L (ref 3.5–5.1)
Sodium: 135 mmol/L (ref 135–145)

## 2023-08-29 LAB — CBC
HCT: 27.1 % — ABNORMAL LOW (ref 36.0–46.0)
Hemoglobin: 8.5 g/dL — ABNORMAL LOW (ref 12.0–15.0)
MCH: 29.9 pg (ref 26.0–34.0)
MCHC: 31.4 g/dL (ref 30.0–36.0)
MCV: 95.4 fL (ref 80.0–100.0)
Platelets: 145 10*3/uL — ABNORMAL LOW (ref 150–400)
RBC: 2.84 MIL/uL — ABNORMAL LOW (ref 3.87–5.11)
RDW: 18.7 % — ABNORMAL HIGH (ref 11.5–15.5)
WBC: 4.7 10*3/uL (ref 4.0–10.5)
nRBC: 0 % (ref 0.0–0.2)

## 2023-08-29 LAB — PROTIME-INR
INR: 2.9 — ABNORMAL HIGH (ref 0.8–1.2)
Prothrombin Time: 30.3 s — ABNORMAL HIGH (ref 11.4–15.2)

## 2023-08-29 MED ORDER — FUROSEMIDE 10 MG/ML IJ SOLN
20.0000 mg | Freq: Once | INTRAMUSCULAR | Status: AC
  Administered 2023-08-29: 20 mg via INTRAVENOUS
  Filled 2023-08-29: qty 2

## 2023-08-29 MED ORDER — BOOST / RESOURCE BREEZE PO LIQD CUSTOM
1.0000 | Freq: Three times a day (TID) | ORAL | Status: DC
  Administered 2023-08-29 – 2023-09-01 (×8): 1 via ORAL
  Filled 2023-08-29 (×2): qty 1

## 2023-08-29 MED ORDER — ADULT MULTIVITAMIN W/MINERALS CH
1.0000 | ORAL_TABLET | Freq: Every day | ORAL | Status: DC
  Administered 2023-08-29 – 2023-09-02 (×5): 1 via ORAL
  Filled 2023-08-29 (×5): qty 1

## 2023-08-29 MED ORDER — IPRATROPIUM-ALBUTEROL 0.5-2.5 (3) MG/3ML IN SOLN
3.0000 mL | RESPIRATORY_TRACT | Status: DC
  Administered 2023-08-29 – 2023-08-30 (×2): 3 mL via RESPIRATORY_TRACT
  Filled 2023-08-29 (×2): qty 3

## 2023-08-29 MED ORDER — PROSOURCE PLUS PO LIQD
30.0000 mL | Freq: Two times a day (BID) | ORAL | Status: DC
  Administered 2023-08-29 – 2023-09-02 (×8): 30 mL via ORAL
  Filled 2023-08-29 (×7): qty 30

## 2023-08-29 NOTE — Progress Notes (Addendum)
[facility]  PROGRESS NOTE  [name] WJX:[id number] DOB: 1933/02/09 DOA: 08/25/2023 PCP: [name]   Brief HPI:   89 y.o. female with medical history significant of "short term memory loss" per the husband, progresing for 6 months.  History obtained from patient over the phone   Patient is a resident of nursing home.  Patient's husband noted that he she was having black stools for the last couple of days and requested that the patient's blood be tested, which revealed severe anemia    Assessment/Plan:   GI bleed -Has history of multiple upper GI bleed in the past -Presented with severe anemia in setting of anticoagulation with warfarin for A-fib as well as aortic mechanical valve -Hemoglobin 6.3; status post blood transfusion -Hemoglobin improved to 9.0 ; now hemoglobin is down to 8.5 -Underwent enteroscopy yesterday, showed gastric AVM but no high risk stigmata for bleeding.  Family at this time is not interested in getting colonoscopy or repeat capsule endoscopy.  Plan to restart Coumadin. -Continue PPI,  -GI has signed off  Transaminitis -Mild elevation of LFTs -Abdominal ultrasound shows cholelithiasis and possible liver cirrhosis -?  Amiodarone associated -Follow LFTs in a.m.  Bilateral wheezing/pulmonary edema/acute bronchitis -Concern for pulmonary edema, IV fluids were discontinued -Chest x-ray showed pulmonary edema and small pleural effusion -BNP was 543, started on Lasix 20 mg IV twice daily; held due to soft BP -Started on DuoNebs every 6 hours scheduled and Solu-Medrol 40 mg IV twice daily, Mucinex 600 mg p.o. twice daily -Will give 1 dose of Lasix 20 mg IV   CKD stage IIIb/IV -Patient's creatinine improving with diuresis, today creatinine is 1.98 -Lasix on hold due to soft BP -Unclear baseline, last creatinine from January 2024 was 1.52 -Follow BMP in am  History of amnesia -Going on for past 6 months as per patient has  been -TSH 2.352, B12 2,160  History of mechanical arctic valve -Supratherapeutic INR was reversed with FFP's -Coumadin was held for GI bleed -Coumadin will be restarted as per GI recommendation  Medications     atorvastatin  40 mg Oral Daily   ezetimibe  10 mg Oral Daily   guaiFENesin  600 mg Oral BID   ipratropium-albuterol  3 mL Nebulization Q6H   levothyroxine  88 mcg Oral Q0600   methylPREDNISolone (SOLU-MEDROL) injection  40 mg Intravenous Q12H   pantoprazole  40 mg Oral BID   sodium chloride flush  3 mL Intravenous Q12H   Warfarin - Pharmacist Dosing Inpatient   Does not apply q1600     Data Reviewed:   CBG:  No results for input(s): "GLUCAP" in the last 168 hours.  SpO2: 99 % O2 Flow Rate (L/min): 2 L/min FiO2 (%): 28 %    Vitals:   08/28/23 1651 08/28/23 2006 08/28/23 2331 08/29/23 0248  BP: 100/69 (!) 147/65 107/82 118/64  Pulse: 80 82 84 82  Resp: 19 20 14 18   Temp: 98.4 F (36.9 C) (!) 97.5 F (36.4 C) 98.2 F (36.8 C) 97.9 F (36.6 C)  TempSrc: Oral Oral Oral Oral  SpO2: 94% 93% 100% 99%  Weight:    71.5 kg  Height:          Data Reviewed:  Basic Metabolic Panel: Recent Labs  Lab 08/25/23 1859 08/26/23 0324 08/27/23 0302 08/28/23 0313  NA 135 136 135 137  K 4.5 4.6 4.6 3.5  CL 106 108 101 103  CO2 19* 19* 21* 23  GLUCOSE 117* 102*  127* 110*  BUN 68* 65* 60* 54*  CREATININE 2.77* 2.55* 2.42* 1.98*  CALCIUM 8.8* 8.6* 8.8* 8.6*    CBC: Recent Labs  Lab 08/25/23 1859 08/26/23 0022 08/26/23 0324 08/26/23 1751 08/27/23 0302 08/29/23 0247  WBC 4.7 4.2 4.2  --  6.5 4.7  HGB 6.3* 7.7* 9.0* 9.7* 9.2* 8.5*  HCT 21.7* 25.5* 28.7* 31.5* 29.4* 27.1*  MCV 102.4* 98.8 96.6  --  95.8 95.4  PLT 199 176 181  --  175 145*    LFT Recent Labs  Lab 08/25/23 1859 08/27/23 0302 08/29/23 0247  AST 60* 171* 201*  ALT 51* 119* 187*  ALKPHOS 64 57 56  BILITOT 0.7 1.2* 1.3*  PROT 5.9* 5.9* 5.7*  ALBUMIN 3.1* 3.0* 2.9*      Antibiotics: Anti-infectives (From admission, onward)    Start     Dose/Rate Route Frequency Ordered Stop   08/25/23 2230  cefTRIAXone (ROCEPHIN) 2 g in sodium chloride 0.9 % 100 mL IVPB        2 g 200 mL/hr over 30 Minutes Intravenous Every 24 hours 08/25/23 2111 09/01/23 2229        DVT prophylaxis: SCDs  Code Status: Full code  Family Communication: No family at bedside   CONSULTS gastroenterology   Subjective   Still complains of shortness of breath  Objective    Physical Examination:  General-appears in no acute distress Heart-S1-S2, regular, no murmur auscultated Lungs-bibasilar crackles auscultated Abdomen-soft, nontender, no organomegaly Extremities-no edema in the lower extremities Neuro-alert, oriented x3, no focal deficit noted   Status is: Inpatient:          [name]   [facility] If 7PM-7AM, please contact night-coverage at [email or web address], Office  [phone]   08/29/2023, 7:54 AM  LOS: 4 days

## 2023-08-29 NOTE — Progress Notes (Addendum)
Initial Nutrition Assessment  INTERVENTION:   -Boost Breeze po TID, each supplement provides 250 kcal and 9 grams of protein   -Magic cup BID with meals, each supplement provides 290 kcal and 9 grams of protein   -Prosource Plus PO BID, each provides 100 kcals and 15g protein  -Multivitamin with minerals daily  -Continue to encourage PO intakes  NUTRITION DIAGNOSIS:   Increased nutrient needs related to acute illness as evidenced by estimated needs.  GOAL:   Patient will meet greater than or equal to 90% of their needs  MONITOR:   PO intake, Supplement acceptance, Labs, Weight trends, I & O's  REASON FOR ASSESSMENT:   Consult Poor PO  ASSESSMENT:   89 y.o. female with a past medical history of arthritis, hypertension, hyperlipidemia, coronary artery disease s/p CABG 1990, chronic diastolic CHF, paroxysmal atrial fibrillation, s/p AVR (mechanical valve) on Warfarin 1993, carotid artery disease, CKD stage IV, diabetes mellitus type 2, cellulitis, macrocytic anemia, GI bleed secondary to H. 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 Husband noted patient was passing black stools for a few days. Pt with short term memory impairment.  Patient admitted 12/3. Having multiple GI bleeds. S/p EGD 12/4 which was normal. Pt's family not wanting further procedures so GI signed off.  Pt has not been eating well for the last 2 days, eating bites of meals and saying she isn't hungry. Staff is encouraging her and ordering meals for her. Did not like the taste of Ensure so will trial Boost Breeze, Magic cups and Prosource to see what may appeal to her.   Per weight records, no weight loss noted. Weight increasing d/t fluid. On Lasix.  Medications: Lasix  Labs reviewed.  NUTRITION - FOCUSED PHYSICAL EXAM:  Pending in-person follow-up  Diet Order:   Diet Order             Diet regular Fluid consistency: Thin  Diet effective now                   EDUCATION  NEEDS:   No education needs have been identified at this time  Skin:  Skin Assessment: Reviewed RN Assessment  Last BM:  12/6  Height:   Ht Readings from Last 1 Encounters:  08/26/23 5' (1.524 m)    Weight:   Wt Readings from Last 1 Encounters:  08/29/23 71.5 kg    BMI:  Body mass index is 30.78 kg/m.  Estimated Nutritional Needs:   Kcal:  1700-1900  Protein:  75-85g  Fluid:  1.7L/day  Tilda Franco, MS, RD, LDN Inpatient Clinical Dietitian Contact via Secure chat

## 2023-08-29 NOTE — Progress Notes (Signed)
PHARMACY - ANTICOAGULATION CONSULT NOTE  Pharmacy Consult for warfarin Indication: mechanical AVR, afib  Allergies  Allergen Reactions   Capzasin [Capsaicin] Other (See Comments)    CAUSED SKIN BURNS THAT LASTED FOR 30 DAYS   Darvon [Propoxyphene] Nausea And Vomiting   Lisinopril Cough   Septra [Sulfamethoxazole-Trimethoprim] Other (See Comments)    Increased INR   Voltaren [Diclofenac] Other (See Comments)    POSSIBLE CHANCE OF INTERNAL BLEEDING MAY RESULT   Warfarin And Related Other (See Comments)    ONLY TOLERATES BRAND   Tobrex [Tobramycin] Other (See Comments)    "Allergic" to this- exact reaction not cited   Trazodone And Nefazodone Other (See Comments)    Severe sweating   Zolpidem Tartrate Other (See Comments)    Hallucinations   Penicillins Rash    Did it involve swelling of the face/tongue/throat, SOB, or low BP? No Did it involve sudden or severe rash/hives, skin peeling, or any reaction on the inside of your mouth or nose? No Did you need to seek medical attention at a hospital or doctor's office? No When did it last happen?  15 years     If all above answers are "NO", may proceed with cephalosporin use.    Patient Measurements: Height: 5' (152.4 cm) Weight: 71.5 kg (157 lb 10.1 oz) IBW/kg (Calculated) : 45.5   Vital Signs: Temp: 97.8 F (36.6 C) (12/07 1200) Temp Source: Oral (12/07 1200) BP: 121/65 (12/07 0800) Pulse Rate: 91 (12/07 0800)  Labs: Recent Labs    08/26/23 1751 08/27/23 0302 08/27/23 0935 08/28/23 0313 08/28/23 0918 08/29/23 0247  HGB 9.7* 9.2*  --   --   --  8.5*  HCT 31.5* 29.4*  --   --   --  27.1*  PLT  --  175  --   --   --  145*  LABPROT  --   --  21.0*  --  28.0* 30.3*  INR  --   --  1.8*  --  2.6* 2.9*  CREATININE  --  2.42*  --  1.98*  --   --     Estimated Creatinine Clearance: 17.3 mL/min (A) (by C-G formula based on SCr of 1.98 mg/dL (H)).   Medical History: Past Medical History:  Diagnosis Date   Anemia     Anginal pain (HCC)    Arteriovenous malformation of gastrointestinal tract    Arthritis    "fingers mostly" (04/12/2015)   Carotid artery disease (HCC)    Carotid US 3/17:  60-79% RICA; 40-59% LICA; Elevated bilateral subclavian artery velocities >>f/u 1 year. // Carotid US 4/18: R 40-59; L 1-39 >> FU 1 year // Carotid US 10/2018: R 40-59; L 1-39, L subclavian stenosis    CHF (congestive heart failure) (HCC)    Chronic kidney disease (CKD), stage III (moderate) (HCC)    Chronic lower GI bleeding    "today; last time was ~ 8 yr ago; used to have them often before that too" (02/11/2013)   Coronary artery disease    DJD (degenerative joint disease)    Dysrhythmia    Heart murmur    History of blood transfusion    "a few times over the years; usually related to my Coumadin" (04/12/2015   History of gout    Hyperlipemia    Hypertension    Macula lutea degeneration    Old MI (myocardial infarction)    "a coulple /dr in 02/2008; I never even knew I'd had them" (04/12/2015)   Type  II diabetes mellitus (HCC)    no medications, diet controlled    Medications:  Medications Prior to Admission  Medication Sig Dispense Refill Last Dose   amiodarone (PACERONE) 200 MG tablet Take 1 tablet by mouth once daily (Patient taking differently: Take 200 mg by mouth daily.) 90 tablet 2 08/25/2023   atorvastatin (LIPITOR) 40 MG tablet Take 1 tablet (40 mg total) by mouth daily. (Patient taking differently: Take 40 mg by mouth at bedtime.) 90 tablet 3 Past Week   Bevacizumab (AVASTIN IV) Inject 1 Dose into the vein every 8 (eight) weeks.   UNK   cephALEXin (KEFLEX) 500 MG capsule Take 2,000 mg by mouth as needed. As needed for dental prophylaxis   UNK   clonazePAM (KLONOPIN) 0.5 MG tablet Take 0.5 mg by mouth 2 (two) times daily.   08/25/2023   clonazePAM (KLONOPIN) 0.5 MG tablet Take 0.5 mg by mouth 2 (two) times daily as needed for anxiety.   UNK   Emollient (BAG BALM) OINT Apply 1 Application topically daily.  Apply to bilateral lower extremities for dry, scaly skin   08/25/2023   ezetimibe (ZETIA) 10 MG tablet Take 1 tablet by mouth once daily (Patient taking differently: Take 10 mg by mouth at bedtime.) 90 tablet 2 Past Week   FeFum-FePo-FA-B Cmp-C-Zn-Mn-Cu (SE-TAN PLUS) 162-115.2-1 MG CAPS Take 1 capsule by mouth in the morning and at bedtime.   08/25/2023   folic acid (FOLVITE) 400 MCG tablet Take 400 mcg by mouth in the morning.   08/25/2023   furosemide (LASIX) 40 MG tablet Take 40 -80 mg by mouth daily based on renal function (Patient taking differently: Take 40 mg by mouth 2 (two) times daily.) 90 tablet 3 08/25/2023   isosorbide mononitrate (IMDUR) 60 MG 24 hr tablet Take 1 tablet by mouth once daily (Patient taking differently: 60 mg daily.) 90 tablet 2 08/25/2023   levothyroxine (SYNTHROID) 88 MCG tablet Take 88 mcg by mouth at bedtime.   Past Week   losartan (COZAAR) 50 MG tablet Take 50 mg by mouth in the morning.   08/25/2023   memantine (NAMENDA) 5 MG tablet Take 5 mg by mouth 2 (two) times daily.   08/25/2023   Multiple Vitamin (MULTIVITAMIN WITH MINERALS) TABS Take 1 tablet by mouth daily with breakfast.   08/25/2023   Multiple Vitamins-Minerals (PRESERVISION AREDS 2 PO) Take 1 capsule by mouth 2 (two) times daily.   08/25/2023   Nutritional Supplements (BOOST GLUCOSE CONTROL PO) Take 1 Bottle by mouth daily.   08/25/2023   Omega-3 Fatty Acids (FISH OIL PO) Take 700 mg by mouth 2 (two) times daily.   08/25/2023   pantoprazole (PROTONIX) 40 MG tablet Take 40 mg by mouth daily.   Past Week   potassium chloride (MICRO-K) 10 MEQ CR capsule Take 20 mEq by mouth daily.   08/25/2023   sertraline (ZOLOFT) 25 MG tablet Take 25 mg by mouth daily.   08/25/2023   TYLENOL 500 MG tablet Take 500-1,000 mg by mouth every 6 (six) hours as needed for mild pain or headache.   UNK   warfarin (COUMADIN) 1 MG tablet Take 1 mg by mouth 3 (three) times a week. Give 1 tablet every Tues, Thurs, and Sat at 1700      warfarin  (COUMADIN) 2 MG tablet Take 2 mg by mouth 4 (four) times a week. Give 1 tablet every Mon, Wed, Fri, and Sun      warfarin (COUMADIN) 2 MG tablet Take 2 mg  by mouth at bedtime.   Past Week   nitroGLYCERIN (NITROSTAT) 0.4 MG SL tablet DISSOLVE ONE TABLET UNDER THE TONGUE EVERY 5 MINUTES AS NEEDED FOR CHEST PAIN.  DO NOT EXCEED A TOTAL OF 3 DOSES IN 15 MINUTES (Patient taking differently: Place 0.4 mg under the tongue every 5 (five) minutes x 3 doses as needed for chest pain.) 25 tablet 5    oxyCODONE (OXY IR/ROXICODONE) 5 MG immediate release tablet Take 5 mg by mouth every 6 (six) hours as needed for moderate pain or severe pain.      Scheduled:   (feeding supplement) PROSource Plus  30 mL Oral BID BM   atorvastatin  40 mg Oral Daily   ezetimibe  10 mg Oral Daily   feeding supplement  1 Container Oral TID BM   guaiFENesin  600 mg Oral BID   ipratropium-albuterol  3 mL Nebulization Q6H   levothyroxine  88 mcg Oral Q0600   methylPREDNISolone (SOLU-MEDROL) injection  40 mg Intravenous Q12H   multivitamin with minerals  1 tablet Oral Daily   pantoprazole  40 mg Oral BID   sodium chloride flush  3 mL Intravenous Q12H   Warfarin - Pharmacist Dosing Inpatient   Does not apply q1600    Assessment: 54 you female with concern of GIB s/p EGD 12/04 with no bleeding source found. She is on warfarin for mechanical AVR and afib. Pharmacy consulted to resume warfarin  -Hg 6.3 on 12/3 now up to 9.2>8.5 and PLTC 175>145. -INR= 1.8> 2.6 after Warfarin 2mg  x1 on 12/5> INR 2.9 today. -LFTs 171/119 on 12/5>>201/187 on 08/29/23.  Dietician noted 12/7 that Pt has not been eating well for the last 2 days, eating bites of meals and saying she isn't hungry. Nutrition plan noted that will trial Boost Breeze, Magic cups and Prosource to see what may appeal to her.   Home warfarin dose: 2mg  MWFSu, 1mg  TTSa   Goal of Therapy:  INE goal 2-2.5; keep closer to 2.0 per MD Monitor platelets by anticoagulation protocol:  Yes   Plan:  -Hold warfarin due to INR trend up (possibly due to liver dysfunction and antibiotics) -Daily PT/INR    Noah Delaine, RPh Clinical Pharmacist **Pharmacist phone directory can now be found on [email or web address] ([email or web address]).  Listed under [facility].

## 2023-08-29 NOTE — Plan of Care (Signed)
  Problem: Clinical Measurements: Goal: Will remain free from infection Outcome: Progressing Goal: Respiratory complications will improve Outcome: Progressing   Problem: Activity: Goal: Risk for activity intolerance will decrease Outcome: Progressing   Problem: Nutrition: Goal: Adequate nutrition will be maintained Outcome: Progressing   Problem: Elimination: Goal: Will not experience complications related to bowel motility Outcome: Progressing Goal: Will not experience complications related to urinary retention Outcome: Progressing   Problem: Pain Management: Goal: General experience of comfort will improve Outcome: Progressing   Problem: Safety: Goal: Ability to remain free from injury will improve Outcome: Progressing   Problem: Skin Integrity: Goal: Risk for impaired skin integrity will decrease Outcome: Progressing

## 2023-08-30 DIAGNOSIS — R748 Abnormal levels of other serum enzymes: Secondary | ICD-10-CM | POA: Diagnosis not present

## 2023-08-30 DIAGNOSIS — Z7901 Long term (current) use of anticoagulants: Secondary | ICD-10-CM | POA: Diagnosis not present

## 2023-08-30 DIAGNOSIS — K922 Gastrointestinal hemorrhage, unspecified: Secondary | ICD-10-CM | POA: Diagnosis not present

## 2023-08-30 LAB — COMPREHENSIVE METABOLIC PANEL
ALT: 151 U/L — ABNORMAL HIGH (ref 0–44)
AST: 99 U/L — ABNORMAL HIGH (ref 15–41)
Albumin: 3.1 g/dL — ABNORMAL LOW (ref 3.5–5.0)
Alkaline Phosphatase: 59 U/L (ref 38–126)
Anion gap: 13 (ref 5–15)
BUN: 56 mg/dL — ABNORMAL HIGH (ref 8–23)
CO2: 22 mmol/L (ref 22–32)
Calcium: 9.2 mg/dL (ref 8.9–10.3)
Chloride: 100 mmol/L (ref 98–111)
Creatinine, Ser: 1.62 mg/dL — ABNORMAL HIGH (ref 0.44–1.00)
GFR, Estimated: 30 mL/min — ABNORMAL LOW (ref >60)
Glucose, Bld: 164 mg/dL — ABNORMAL HIGH (ref 70–99)
Potassium: 3.8 mmol/L (ref 3.5–5.1)
Sodium: 135 mmol/L (ref 135–145)
Total Bilirubin: 1 mg/dL (ref <1.2)
Total Protein: 5.9 g/dL — ABNORMAL LOW (ref 6.5–8.1)

## 2023-08-30 LAB — CBC
HCT: 27.6 % — ABNORMAL LOW (ref 36.0–46.0)
Hemoglobin: 8.9 g/dL — ABNORMAL LOW (ref 12.0–15.0)
MCH: 30.5 pg (ref 26.0–34.0)
MCHC: 32.2 g/dL (ref 30.0–36.0)
MCV: 94.5 fL (ref 80.0–100.0)
Platelets: 163 10*3/uL (ref 150–400)
RBC: 2.92 MIL/uL — ABNORMAL LOW (ref 3.87–5.11)
RDW: 18.1 % — ABNORMAL HIGH (ref 11.5–15.5)
WBC: 7.9 10*3/uL (ref 4.0–10.5)
nRBC: 0 % (ref 0.0–0.2)

## 2023-08-30 LAB — PROTIME-INR
INR: 4 — ABNORMAL HIGH (ref 0.8–1.2)
Prothrombin Time: 38.9 s — ABNORMAL HIGH (ref 11.4–15.2)

## 2023-08-30 MED ORDER — IPRATROPIUM-ALBUTEROL 0.5-2.5 (3) MG/3ML IN SOLN
3.0000 mL | Freq: Four times a day (QID) | RESPIRATORY_TRACT | Status: DC
Start: 2023-08-30 — End: 2023-08-31
  Administered 2023-08-30 – 2023-08-31 (×3): 3 mL via RESPIRATORY_TRACT
  Filled 2023-08-30 (×3): qty 3

## 2023-08-30 MED ORDER — FUROSEMIDE 10 MG/ML IJ SOLN
20.0000 mg | Freq: Once | INTRAMUSCULAR | Status: AC
  Administered 2023-08-30: 20 mg via INTRAVENOUS
  Filled 2023-08-30: qty 2

## 2023-08-30 NOTE — Evaluation (Signed)
Occupational Therapy Evaluation Patient Details Name: [name] MRN: [id number] DOB: 1932-11-22 Today's Date: 08/30/2023   History of Present Illness Pt is a 89 y.o. female SNF resident admitted 08/25/23 with GIB, transaminitis, pulmonary edema. S/p enteroscopy 12/4; husband preference to avoid additional invasive procedures. Of note, pt's husband reports worsening short-term memory loss the past 6 months. Other PMH includes CAD, CHF, CKD III, chronic GIB, DJD, HLD, HTN, MI, DM2, AVR, lumbar sx.   Clinical Impression   At baseline, pt is largely completes ADLs Independent to Supervision, performs functional transfers Independent to Mod I, and performs functional mobility from wheelchair level with Mod I. Pt receives assistance from [employer] and husband for all IADLs. Pt now presents with decreased activity tolerance, decreased balance during functional tasks, generalized B UE weakness, and decreased safety and independence with functional tasks. Pt currently demonstrates ability to complete UB ADLs with Set up to Min assist, LB ADLs with Max +2 to Total assist +2, and functional sit<-> stand transfers with Mod-Max assist +2. Pt with supportive husband and pt is motivated to return to baseline PLOF. Pt will benefit from acute skilled OT services to address deficits outlined below, decrease caregiver burden, and increase safety and independence with functional tasks. Post acute discharge, pt will benefit from continued skilled OT services at [facility] to maximize rehab potential. OT also recommend Speech evaluation to assess pt swallowing as pt noted by OT and RN to have cough worse with drinking and eating.      If plan is discharge home, recommend the following: Two people to help with walking and/or transfers;Two people to help with bathing/dressing/bathroom;Assistance with cooking/housework;Assistance with feeding;Direct supervision/assist for medications management;Direct supervision/assist for  financial management;Assist for transportation;Supervision due to cognitive status;Help with stairs or ramp for entrance    Functional Status Assessment  Patient has had a recent decline in their functional status and demonstrates the ability to make significant improvements in function in a reasonable and predictable amount of time.  Equipment Recommendations  Other (comment) (defer to next level of care)    Recommendations for Other Services Speech consult (for swallowing assessment)     Precautions / Restrictions Precautions Precautions: Fall Restrictions Weight Bearing Restrictions: No      Mobility Bed Mobility               General bed mobility comments: Sitting in recliner at beginning and end of session    Transfers Overall transfer level: Needs assistance Equipment used: Rolling walker (2 wheels) Transfers: Sit to/from Stand Sit to Stand: Mod assist, Max assist           General transfer comment: significant increased time and effort to scoot self forward to edge of recliner; Mod to Max assist for trunk elevation with sit<>stand from recliner to RW; cues and increased time for hand placement and to come to full stand      Balance Overall balance assessment: Needs assistance Sitting-balance support: No upper extremity supported, Feet supported Sitting balance-Leahy Scale: Fair     Standing balance support: Bilateral upper extremity supported, Reliant on assistive device for balance, During functional activity Standing balance-Leahy Scale: Poor                             ADL either performed or assessed with clinical judgement   ADL Overall ADL's : Needs assistance/impaired Eating/Feeding: Set up;Supervision/ safety;Sitting   Grooming: Contact guard assist;Sitting;Cueing for sequencing   Upper Body  Bathing: Minimal assistance;Sitting;Cueing for sequencing;Cueing for compensatory techniques (with increased time and effort and need for  rest breaks)   Lower Body Bathing: Total assistance;+2 for safety/equipment;+2 for physical assistance;Sitting/lateral leans;Sit to/from stand;Cueing for sequencing;Cueing for safety   Upper Body Dressing : Contact guard assist;Sitting;Cueing for sequencing   Lower Body Dressing: Maximal assistance;+2 for physical assistance;+2 for safety/equipment;Sitting/lateral leans;Sit to/from stand;Cueing for sequencing;Cueing for compensatory techniques;Cueing for safety     Toilet Transfer Details (indicate cue type and reason): deferred this session for pt/therapist safety due to level of assist currently needed Toileting- Clothing Manipulation and Hygiene: Total assistance;+2 for physical assistance;+2 for safety/equipment;Sit to/from stand;Cueing for safety;Cueing for sequencing Toileting - Clothing Manipulation Details (indicate cue type and reason): simulated       General ADL Comments: Pt presents with decreased activity tolerance and difficulty sequencing tasks.     Vision Baseline Vision/History: 1 Wears glasses Ability to See in Adequate Light: 0 Adequate (with glasses) Patient Visual Report: No change from baseline       Perception         Praxis         Pertinent Vitals/Pain Pain Assessment Pain Assessment: No/denies pain     Extremity/Trunk Assessment Upper Extremity Assessment Upper Extremity Assessment: Right hand dominant;Generalized weakness   Lower Extremity Assessment Lower Extremity Assessment: Defer to PT evaluation       Communication Communication Communication: No apparent difficulties   Cognition Arousal: Alert Behavior During Therapy: WFL for tasks assessed/performed Overall Cognitive Status: History of cognitive impairments - at baseline                                 General Comments: h/o short-term memory loss. pt pleasantly confused, oriented to herself and husband, following 1-step commands consistantly; pt loves to read,  especially mystery novels     General Comments  Pt's husband very supportive and present throughout session. RN present through a portion of session. VSS on 2L continuous O2 through nasal cannula throughout session.    Exercises     Shoulder Instructions      Home Living Family/patient expects to be discharged to:: Skilled nursing facility                                 Additional Comments: has been living at [facility] since 10/2022, husband visits daily      Prior Functioning/Environment Prior Level of Function : Needs assist             Mobility Comments: typically able to transfer self to/from wheelchair, self-propel, use grab bars to get to/from toilet; PT working on ambulating with RW. staff available to assist as needed. a few months ago pt was more ambulatory, but has had worsening physical/cognitive status past few months ADLs Comments: Pt typically completes ADLs Independent to Supervision with occasional staff assist as needed. Staff assist with IADLs.        OT Problem List: Decreased strength;Decreased activity tolerance;Impaired balance (sitting and/or standing)      OT Treatment/Interventions: Self-care/ADL training;Therapeutic exercise;DME and/or AE instruction;Therapeutic activities;Patient/family education    OT Goals(Current goals can be found in the care plan section) Acute Rehab OT Goals Patient Stated Goal: to feel better and be able to go home OT Goal Formulation: With patient/family Time For Goal Achievement: 09/13/23 Potential to Achieve Goals: Good ADL Goals Pt Will Perform  Grooming: with set-up;sitting (sitting EOB with Good balance) Pt Will Perform Lower Body Bathing: with mod assist;sitting/lateral leans;sit to/from stand Pt Will Perform Lower Body Dressing: with min assist;sitting/lateral leans;sit to/from stand Pt Will Transfer to Toilet: with min assist;bedside commode;ambulating (with least restrictive AD) Pt Will Perform  Toileting - Clothing Manipulation and hygiene: with mod assist;sit to/from stand;sitting/lateral leans Pt/caregiver will Perform Home Exercise Program: Increased strength;Both right and left upper extremity;With theraband;With minimal assist;With written HEP provided (Increased activity tolerance)  OT Frequency: Min 1X/week    Co-evaluation              AM-PAC OT "6 Clicks" Daily Activity     Outcome Measure Help from another person eating meals?: A Little Help from another person taking care of personal grooming?: A Little Help from another person toileting, which includes using toliet, bedpan, or urinal?: Total Help from another person bathing (including washing, rinsing, drying)?: A Lot Help from another person to put on and taking off regular upper body clothing?: A Little Help from another person to put on and taking off regular lower body clothing?: A Lot 6 Click Score: 14   End of Session Equipment Utilized During Treatment: Gait belt;Rolling walker (2 wheels);Oxygen Nurse Communication: Mobility status;Other (comment) (Pt coughing throughout session with RN reporting noted increased coughing with eating and drinking)  Activity Tolerance: Patient tolerated treatment well Patient left: in chair;with call bell/phone within reach;with family/visitor present  OT Visit Diagnosis: Unsteadiness on feet (R26.81);Other abnormalities of gait and mobility (R26.89);Muscle weakness (generalized) (M62.81);Other (comment) (decreased activity tolerance)                Time: 1191-4782 OT Time Calculation (min): 30 min Charges:  OT General Charges $OT Visit: 1 Visit OT Evaluation $OT Eval Low Complexity: 1 Low OT Treatments $Self Care/Home Management : 8-22 mins  Arland Usery "Orson Eva., OTR/L, MA Acute Rehab [phone]   Lendon Colonel 08/30/2023, 4:39 PM

## 2023-08-30 NOTE — Progress Notes (Signed)
[facility]  PROGRESS NOTE  [name] GNF:[id number] DOB: 05-Jun-1933 DOA: 08/25/2023 PCP: [facility]   Brief HPI:   89 y.o. female with medical history significant of "short term memory loss" per the husband, progresing for 6 months.  History obtained from patient over the phone   Patient is a resident of nursing home.  Patient's husband noted that he she was having black stools for the last couple of days and requested that the patient's blood be tested, which revealed severe anemia    Assessment/Plan:   GI bleed -Has history of multiple upper GI bleed in the past -Presented with severe anemia in setting of anticoagulation with warfarin for A-fib as well as aortic mechanical valve -Hemoglobin 6.3; status post blood transfusion -Hemoglobin improved to 9.0 ; now hemoglobin is down to 8.5 -Underwent enteroscopy yesterday, showed gastric AVM but no high risk stigmata for bleeding.  Family at this time is not interested in getting colonoscopy or repeat capsule endoscopy.  Plan to restart Coumadin. -Continue PPI,  -GI has signed off  Transaminitis -Mild elevation of LFTs; improving -Abdominal ultrasound shows cholelithiasis and possible liver cirrhosis -?  Amiodarone associated -Follow LFTs in a.m.  Bilateral wheezing/pulmonary edema/acute bronchitis -Slowly improving -Concern for pulmonary edema, IV fluids were discontinued -Chest x-ray showed pulmonary edema and small pleural effusion -BNP was 543, started on Lasix 20 mg IV twice daily; held due to soft BP -Started on DuoNebs every 6 hours scheduled and Solu-Medrol 40 mg IV twice daily, Mucinex 600 mg p.o. twice daily -Will give 1 dose of Lasix 20 mg IV tonight   CKD stage IIIb/IV -Patient's creatinine improving with diuresis, today creatinine has improved to 1.62 -Lasix on hold due to soft BP -Unclear baseline, last creatinine from January 2024 was 1.52 -Follow BMP in am  History of  amnesia -Going on for past 6 months as per patient has been -TSH 2.352, B12 2,160  History of mechanical arctic valve -Supratherapeutic INR was reversed with FFP's -Coumadin was held for GI bleed -Coumadin will be restarted as per GI recommendation  Medications     (feeding supplement) PROSource Plus  30 mL Oral BID BM   atorvastatin  40 mg Oral Daily   ezetimibe  10 mg Oral Daily   feeding supplement  1 Container Oral TID BM   guaiFENesin  600 mg Oral BID   ipratropium-albuterol  3 mL Nebulization Q4H WA   levothyroxine  88 mcg Oral Q0600   methylPREDNISolone (SOLU-MEDROL) injection  40 mg Intravenous Q12H   multivitamin with minerals  1 tablet Oral Daily   pantoprazole  40 mg Oral BID   sodium chloride flush  3 mL Intravenous Q12H   Warfarin - Pharmacist Dosing Inpatient   Does not apply q1600     Data Reviewed:   CBG:  No results for input(s): "GLUCAP" in the last 168 hours.  SpO2: 100 % O2 Flow Rate (L/min): 2 L/min FiO2 (%): 28 %    Vitals:   08/29/23 2001 08/29/23 2301 08/30/23 0324 08/30/23 0800  BP: (!) 156/73 (!) 130/51 109/81 120/86  Pulse: 87 92 90 81  Resp: 20 20 15 18   Temp: 98.1 F (36.7 C) 97.8 F (36.6 C) 97.6 F (36.4 C) (!) 97.4 F (36.3 C)  TempSrc: Oral Oral Oral Oral  SpO2: 99% 100% 99% 100%  Weight:      Height:          Data Reviewed:  Basic Metabolic Panel: Recent  Labs  Lab 08/26/23 0324 08/27/23 0302 08/28/23 0313 08/29/23 1404 08/30/23 0800  NA 136 135 137 135 135  K 4.6 4.6 3.5 3.9 3.8  CL 108 101 103 100 100  CO2 19* 21* 23 22 22   GLUCOSE 102* 127* 110* 172* 164*  BUN 65* 60* 54* 53* 56*  CREATININE 2.55* 2.42* 1.98* 1.71* 1.62*  CALCIUM 8.6* 8.8* 8.6* 9.0 9.2    CBC: Recent Labs  Lab 08/26/23 0022 08/26/23 0324 08/26/23 1751 08/27/23 0302 08/29/23 0247 08/30/23 0800  WBC 4.2 4.2  --  6.5 4.7 7.9  HGB 7.7* 9.0* 9.7* 9.2* 8.5* 8.9*  HCT 25.5* 28.7* 31.5* 29.4* 27.1* 27.6*  MCV 98.8 96.6  --  95.8 95.4  94.5  PLT 176 181  --  175 145* 163    LFT Recent Labs  Lab 08/25/23 1859 08/27/23 0302 08/29/23 0247 08/30/23 0800  AST 60* 171* 201* 99*  ALT 51* 119* 187* 151*  ALKPHOS 64 57 56 59  BILITOT 0.7 1.2* 1.3* 1.0  PROT 5.9* 5.9* 5.7* 5.9*  ALBUMIN 3.1* 3.0* 2.9* 3.1*     Antibiotics: Anti-infectives (From admission, onward)    Start     Dose/Rate Route Frequency Ordered Stop   08/25/23 2230  cefTRIAXone (ROCEPHIN) 2 g in sodium chloride 0.9 % 100 mL IVPB        2 g 200 mL/hr over 30 Minutes Intravenous Every 24 hours 08/25/23 2111 09/01/23 2229        DVT prophylaxis: SCDs  Code Status: Full code  Family Communication: No family at bedside   CONSULTS gastroenterology   Subjective   Patient seen and examined, breathing has improved.  Objective    Physical Examination:  General-appears in no acute distress Heart-S1-S2, regular, no murmur auscultated Lungs-bibasilar crackles auscultated Abdomen-soft, nontender, no organomegaly Extremities-no edema in the lower extremities Neuro-alert, oriented x3, no focal deficit noted   Status is: Inpatient:          [name] S [name]   [facility] If 7PM-7AM, please contact night-coverage at [email or web address], Office  [phone]   08/30/2023, 9:01 AM  LOS: 5 days

## 2023-08-30 NOTE — Progress Notes (Addendum)
PHARMACY - ANTICOAGULATION CONSULT NOTE  Pharmacy Consult for warfarin Indication: mechanical AVR, afib  Allergies  Allergen Reactions   Capzasin [Capsaicin] Other (See Comments)    CAUSED SKIN BURNS THAT LASTED FOR 30 DAYS   Darvon [Propoxyphene] Nausea And Vomiting   Lisinopril Cough   Septra [Sulfamethoxazole-Trimethoprim] Other (See Comments)    Increased INR   Voltaren [Diclofenac] Other (See Comments)    POSSIBLE CHANCE OF INTERNAL BLEEDING MAY RESULT   Warfarin And Related Other (See Comments)    ONLY TOLERATES BRAND   Tobrex [Tobramycin] Other (See Comments)    "Allergic" to this- exact reaction not cited   Trazodone And Nefazodone Other (See Comments)    Severe sweating   Zolpidem Tartrate Other (See Comments)    Hallucinations   Penicillins Rash    Did it involve swelling of the face/tongue/throat, SOB, or low BP? No Did it involve sudden or severe rash/hives, skin peeling, or any reaction on the inside of your mouth or nose? No Did you need to seek medical attention at a hospital or doctor's office? No When did it last happen?  15 years     If all above answers are "NO", may proceed with cephalosporin use.    Patient Measurements: Height: 5' (152.4 cm) Weight: 71.5 kg (157 lb 10.1 oz) IBW/kg (Calculated) : 45.5   Vital Signs: Temp: 97.6 F (36.4 C) (12/08 0324) Temp Source: Oral (12/08 0324) BP: 109/81 (12/08 0324) Pulse Rate: 90 (12/08 0324)  Labs: Recent Labs    08/28/23 0313 08/28/23 0918 08/29/23 0247 08/29/23 1404 08/30/23 0319  HGB  --   --  8.5*  --   --   HCT  --   --  27.1*  --   --   PLT  --   --  145*  --   --   LABPROT  --  28.0* 30.3*  --  38.9*  INR  --  2.6* 2.9*  --  4.0*  CREATININE 1.98*  --   --  1.71*  --     Estimated Creatinine Clearance: 20.1 mL/min (A) (by C-G formula based on SCr of 1.71 mg/dL (H)).   Medical History: Past Medical History:  Diagnosis Date   Anemia    Anginal pain (HCC)    Arteriovenous  malformation of gastrointestinal tract    Arthritis    "fingers mostly" (04/12/2015)   Carotid artery disease (HCC)    Carotid US 3/17:  60-79% RICA; 40-59% LICA; Elevated bilateral subclavian artery velocities >>f/u 1 year. // Carotid US 4/18: R 40-59; L 1-39 >> FU 1 year // Carotid US 10/2018: R 40-59; L 1-39, L subclavian stenosis    CHF (congestive heart failure) (HCC)    Chronic kidney disease (CKD), stage III (moderate) (HCC)    Chronic lower GI bleeding    "today; last time was ~ 8 yr ago; used to have them often before that too" (02/11/2013)   Coronary artery disease    DJD (degenerative joint disease)    Dysrhythmia    Heart murmur    History of blood transfusion    "a few times over the years; usually related to my Coumadin" (04/12/2015   History of gout    Hyperlipemia    Hypertension    Macula lutea degeneration    Old MI (myocardial infarction)    "a coulple /dr in 02/2008; I never even knew I'd had them" (04/12/2015)   Type II diabetes mellitus (HCC)    no medications, diet  controlled    Medications:  Medications Prior to Admission  Medication Sig Dispense Refill Last Dose   amiodarone (PACERONE) 200 MG tablet Take 1 tablet by mouth once daily (Patient taking differently: Take 200 mg by mouth daily.) 90 tablet 2 08/25/2023   atorvastatin (LIPITOR) 40 MG tablet Take 1 tablet (40 mg total) by mouth daily. (Patient taking differently: Take 40 mg by mouth at bedtime.) 90 tablet 3 Past Week   Bevacizumab (AVASTIN IV) Inject 1 Dose into the vein every 8 (eight) weeks.   UNK   cephALEXin (KEFLEX) 500 MG capsule Take 2,000 mg by mouth as needed. As needed for dental prophylaxis   UNK   clonazePAM (KLONOPIN) 0.5 MG tablet Take 0.5 mg by mouth 2 (two) times daily.   08/25/2023   clonazePAM (KLONOPIN) 0.5 MG tablet Take 0.5 mg by mouth 2 (two) times daily as needed for anxiety.   UNK   Emollient (BAG BALM) OINT Apply 1 Application topically daily. Apply to bilateral lower extremities for  dry, scaly skin   08/25/2023   ezetimibe (ZETIA) 10 MG tablet Take 1 tablet by mouth once daily (Patient taking differently: Take 10 mg by mouth at bedtime.) 90 tablet 2 Past Week   FeFum-FePo-FA-B Cmp-C-Zn-Mn-Cu (SE-TAN PLUS) 162-115.2-1 MG CAPS Take 1 capsule by mouth in the morning and at bedtime.   08/25/2023   folic acid (FOLVITE) 400 MCG tablet Take 400 mcg by mouth in the morning.   08/25/2023   furosemide (LASIX) 40 MG tablet Take 40 -80 mg by mouth daily based on renal function (Patient taking differently: Take 40 mg by mouth 2 (two) times daily.) 90 tablet 3 08/25/2023   isosorbide mononitrate (IMDUR) 60 MG 24 hr tablet Take 1 tablet by mouth once daily (Patient taking differently: 60 mg daily.) 90 tablet 2 08/25/2023   levothyroxine (SYNTHROID) 88 MCG tablet Take 88 mcg by mouth at bedtime.   Past Week   losartan (COZAAR) 50 MG tablet Take 50 mg by mouth in the morning.   08/25/2023   memantine (NAMENDA) 5 MG tablet Take 5 mg by mouth 2 (two) times daily.   08/25/2023   Multiple Vitamin (MULTIVITAMIN WITH MINERALS) TABS Take 1 tablet by mouth daily with breakfast.   08/25/2023   Multiple Vitamins-Minerals (PRESERVISION AREDS 2 PO) Take 1 capsule by mouth 2 (two) times daily.   08/25/2023   Nutritional Supplements (BOOST GLUCOSE CONTROL PO) Take 1 Bottle by mouth daily.   08/25/2023   Omega-3 Fatty Acids (FISH OIL PO) Take 700 mg by mouth 2 (two) times daily.   08/25/2023   pantoprazole (PROTONIX) 40 MG tablet Take 40 mg by mouth daily.   Past Week   potassium chloride (MICRO-K) 10 MEQ CR capsule Take 20 mEq by mouth daily.   08/25/2023   sertraline (ZOLOFT) 25 MG tablet Take 25 mg by mouth daily.   08/25/2023   TYLENOL 500 MG tablet Take 500-1,000 mg by mouth every 6 (six) hours as needed for mild pain or headache.   UNK   warfarin (COUMADIN) 1 MG tablet Take 1 mg by mouth 3 (three) times a week. Give 1 tablet every Tues, Thurs, and Sat at 1700      warfarin (COUMADIN) 2 MG tablet Take 2 mg by mouth  4 (four) times a week. Give 1 tablet every Mon, Wed, Fri, and Sun      warfarin (COUMADIN) 2 MG tablet Take 2 mg by mouth at bedtime.   Past Week  nitroGLYCERIN (NITROSTAT) 0.4 MG SL tablet DISSOLVE ONE TABLET UNDER THE TONGUE EVERY 5 MINUTES AS NEEDED FOR CHEST PAIN.  DO NOT EXCEED A TOTAL OF 3 DOSES IN 15 MINUTES (Patient taking differently: Place 0.4 mg under the tongue every 5 (five) minutes x 3 doses as needed for chest pain.) 25 tablet 5    oxyCODONE (OXY IR/ROXICODONE) 5 MG immediate release tablet Take 5 mg by mouth every 6 (six) hours as needed for moderate pain or severe pain.      Scheduled:   (feeding supplement) PROSource Plus  30 mL Oral BID BM   atorvastatin  40 mg Oral Daily   ezetimibe  10 mg Oral Daily   feeding supplement  1 Container Oral TID BM   guaiFENesin  600 mg Oral BID   ipratropium-albuterol  3 mL Nebulization Q4H WA   levothyroxine  88 mcg Oral Q0600   methylPREDNISolone (SOLU-MEDROL) injection  40 mg Intravenous Q12H   multivitamin with minerals  1 tablet Oral Daily   pantoprazole  40 mg Oral BID   sodium chloride flush  3 mL Intravenous Q12H   Warfarin - Pharmacist Dosing Inpatient   Does not apply q1600    Assessment: 64 you female with concern of GIB s/p EGD 12/04 with no bleeding source found on imaging. She is on warfarin for mechanical AVR and afib. Pharmacy consulted to resume warfarin.  Home warfarin dose: 2mg  MWFSu, 1mg  TTSa   INR up to 4 today - last warfarin dose 12/5 (doses held due to uptrending INR). LFTs elevated but improving. CBC stable. RN noted bleeding hemorrhoids on exam, but no blood in stool. Not much PO intake but taking Ensure/Breeze.   Goal of Therapy:  INR goal 2-2.5; keep closer to 2.0 per MD Monitor platelets by anticoagulation protocol: Yes   Plan:  -Hold warfarin due to elevated INR -Daily PT/INR  Rexford Maus, PharmD, BCPS 08/30/2023 7:26 AM

## 2023-08-30 NOTE — Evaluation (Signed)
Physical Therapy Evaluation Patient Details Name: [name] MRN: [id number] DOB: 1933/08/10 Today's Date: 08/30/2023  History of Present Illness  89 y.o. female SNF resident admitted 08/25/23 with GIB, transaminitis, pulmonary edema. S/p enteroscopy 12/4; husband preference to avoid additional invasive procedures. Of note, pt's husband reports worsening short-term memory loss the past 6 months. Other PMH includes CAD, CHF, CKD III, chronic GIB, DJD, HLD, HTN, MI, DM2, AVR, lumbar sx.   Clinical Impression  Pt presents with an overall decrease in functional mobility secondary to above. PTA, pt resident at [facility], uses RW and wheelchair for mobility, staff assists as needed and husband visits daily. Today, pt requiring mod-maxA for standing with RW, quick to fatigue and unable to take steps this session. Pt would benefit from continued acute PT services to maximize functional mobility and independence prior to return to [facility].     SpO2 96% on RA, HR 102 with activity     If plan is discharge home, recommend the following: A lot of help with walking and/or transfers;A lot of help with bathing/dressing/bathroom;Assistance with cooking/housework;Direct supervision/assist for medications management;Direct supervision/assist for financial management;Assist for transportation;Help with stairs or ramp for entrance   Can travel by private vehicle   No    Equipment Recommendations  (defer to next venue)  Recommendations for Other Services       Functional Status Assessment Patient has had a recent decline in their functional status and demonstrates the ability to make significant improvements in function in a reasonable and predictable amount of time.     Precautions / Restrictions Precautions Precautions: Fall Restrictions Weight Bearing Restrictions: No      Mobility  Bed Mobility               General bed mobility comments: received sitting in recliner     Transfers Overall transfer level: Needs assistance Equipment used: Rolling walker (2 wheels) Transfers: Sit to/from Stand Sit to Stand: Mod assist           General transfer comment: significant increased time and effort to scoot self forward to edge of recliner with multimodal cues for sequencing, modA for trunk elevation with 2x sit<>stand from recliner to RW; cues for hand placement, minA for eccentric lowering    Ambulation/Gait             Pre-gait activities: pt able to static stand initially but having difficulty with weight shifts and unable to take steps (pt states, "they don't want to move"); on second stand, pt bracing BLEs against recliner having significant difficulty with anterior weight shift. significantly fatigued by this    Stairs            Wheelchair Mobility     Tilt Bed    Modified Rankin (Stroke Patients Only)       Balance Overall balance assessment: Needs assistance Sitting-balance support: No upper extremity supported, Feet supported Sitting balance-Leahy Scale: Fair     Standing balance support: Reliant on assistive device for balance Standing balance-Leahy Scale: Poor                               Pertinent Vitals/Pain Pain Assessment Pain Assessment: No/denies pain    Home Living Family/patient expects to be discharged to:: Skilled nursing facility                   Additional Comments: has been living at [facility] since 10/2022,  husband visits daily    Prior Function Prior Level of Function : Needs assist             Mobility Comments: typically able to transfer self to/from wheelchair, self-propel, use grab bars to get to/from toilet; PT working on ambulating with RW. staff available to assist as needed. a few months ago pt was more ambulatory, but has had worsening physical/cognitive status past few months ADLs Comments: staff assist as needed     Extremity/Trunk Assessment   Upper  Extremity Assessment Upper Extremity Assessment: Generalized weakness    Lower Extremity Assessment Lower Extremity Assessment: Generalized weakness       Communication      Cognition Arousal: Alert Behavior During Therapy: Flat affect Overall Cognitive Status: History of cognitive impairments - at baseline                                 General Comments: h/o short-term memory loss. pt pleasantly confused, oriented to herself and husband, following simple commands        General Comments General comments (skin integrity, edema, etc.): pt's husband Peyton Najjar) present and supportive. educ re: POC, activity recommendations, discharge needs    Exercises General Exercises - Lower Extremity Ankle Circles/Pumps: AROM, Both, Seated Long Arc Quad: AROM, Both, Seated Hip Flexion/Marching: AROM, Both, Seated   Assessment/Plan    PT Assessment Patient needs continued PT services  PT Problem List Decreased strength;Decreased activity tolerance;Decreased balance;Decreased mobility;Decreased cognition;Decreased knowledge of use of DME;Decreased safety awareness       PT Treatment Interventions DME instruction;Gait training;Functional mobility training;Therapeutic activities;Therapeutic exercise;Balance training;Patient/family education;Wheelchair mobility training    PT Goals (Current goals can be found in the Care Plan section)  Acute Rehab PT Goals Patient Stated Goal: return to [facility] PT Goal Formulation: With patient/family Time For Goal Achievement: 09/13/23 Potential to Achieve Goals: Fair    Frequency Min 1X/week     Co-evaluation               AM-PAC PT "6 Clicks" Mobility  Outcome Measure Help needed turning from your back to your side while in a flat bed without using bedrails?: A Lot Help needed moving from lying on your back to sitting on the side of a flat bed without using bedrails?: A Lot Help needed moving to and from a bed to a chair  (including a wheelchair)?: A Lot Help needed standing up from a chair using your arms (e.g., wheelchair or bedside chair)?: A Lot Help needed to walk in hospital room?: Total Help needed climbing 3-5 steps with a railing? : Total 6 Click Score: 10    End of Session Equipment Utilized During Treatment: Gait belt Activity Tolerance: Patient tolerated treatment well;Patient limited by fatigue Patient left: in chair;with call bell/phone within reach;with family/visitor present Nurse Communication: Mobility status PT Visit Diagnosis: Other abnormalities of gait and mobility (R26.89);Muscle weakness (generalized) (M62.81)    Time: 1410-1431 PT Time Calculation (min) (ACUTE ONLY): 21 min   Charges:   PT Evaluation $PT Eval Moderate Complexity: 1 Mod   PT General Charges $$ ACUTE PT VISIT: 1 Visit       Ina Homes, PT, DPT Acute Rehabilitation Services  Personal: [facility]hab Office: [phone]  Malachy Chamber 08/30/2023, 3:28 PM

## 2023-08-31 ENCOUNTER — Encounter (HOSPITAL_COMMUNITY): Payer: Self-pay | Admitting: Gastroenterology

## 2023-08-31 DIAGNOSIS — K922 Gastrointestinal hemorrhage, unspecified: Secondary | ICD-10-CM | POA: Diagnosis not present

## 2023-08-31 DIAGNOSIS — R413 Other amnesia: Secondary | ICD-10-CM | POA: Diagnosis not present

## 2023-08-31 DIAGNOSIS — R791 Abnormal coagulation profile: Secondary | ICD-10-CM

## 2023-08-31 DIAGNOSIS — D62 Acute posthemorrhagic anemia: Secondary | ICD-10-CM | POA: Diagnosis not present

## 2023-08-31 DIAGNOSIS — K31819 Angiodysplasia of stomach and duodenum without bleeding: Secondary | ICD-10-CM | POA: Diagnosis not present

## 2023-08-31 LAB — COMPREHENSIVE METABOLIC PANEL
ALT: 123 U/L — ABNORMAL HIGH (ref 0–44)
AST: 63 U/L — ABNORMAL HIGH (ref 15–41)
Albumin: 3.1 g/dL — ABNORMAL LOW (ref 3.5–5.0)
Alkaline Phosphatase: 57 U/L (ref 38–126)
Anion gap: 12 (ref 5–15)
BUN: 61 mg/dL — ABNORMAL HIGH (ref 8–23)
CO2: 23 mmol/L (ref 22–32)
Calcium: 9.1 mg/dL (ref 8.9–10.3)
Chloride: 102 mmol/L (ref 98–111)
Creatinine, Ser: 1.61 mg/dL — ABNORMAL HIGH (ref 0.44–1.00)
GFR, Estimated: 31 mL/min — ABNORMAL LOW (ref >60)
Glucose, Bld: 213 mg/dL — ABNORMAL HIGH (ref 70–99)
Potassium: 3.7 mmol/L (ref 3.5–5.1)
Sodium: 137 mmol/L (ref 135–145)
Total Bilirubin: 1 mg/dL (ref <1.2)
Total Protein: 5.9 g/dL — ABNORMAL LOW (ref 6.5–8.1)

## 2023-08-31 LAB — CBC
HCT: 27.7 % — ABNORMAL LOW (ref 36.0–46.0)
Hemoglobin: 8.7 g/dL — ABNORMAL LOW (ref 12.0–15.0)
MCH: 30 pg (ref 26.0–34.0)
MCHC: 31.4 g/dL (ref 30.0–36.0)
MCV: 95.5 fL (ref 80.0–100.0)
Platelets: 178 10*3/uL (ref 150–400)
RBC: 2.9 MIL/uL — ABNORMAL LOW (ref 3.87–5.11)
RDW: 18.1 % — ABNORMAL HIGH (ref 11.5–15.5)
WBC: 8.4 10*3/uL (ref 4.0–10.5)
nRBC: 0.5 % — ABNORMAL HIGH (ref 0.0–0.2)

## 2023-08-31 LAB — PROTIME-INR
INR: 5.5 (ref 0.8–1.2)
Prothrombin Time: 50.5 s — ABNORMAL HIGH (ref 11.4–15.2)

## 2023-08-31 LAB — HEMOGLOBIN AND HEMATOCRIT, BLOOD
HCT: 28.8 % — ABNORMAL LOW (ref 36.0–46.0)
Hemoglobin: 9 g/dL — ABNORMAL LOW (ref 12.0–15.0)

## 2023-08-31 MED ORDER — IPRATROPIUM-ALBUTEROL 0.5-2.5 (3) MG/3ML IN SOLN
3.0000 mL | Freq: Two times a day (BID) | RESPIRATORY_TRACT | Status: DC
  Administered 2023-09-01: 3 mL via RESPIRATORY_TRACT
  Filled 2023-08-31 (×2): qty 3

## 2023-08-31 MED ORDER — PHYTONADIONE 5 MG PO TABS
2.5000 mg | ORAL_TABLET | Freq: Once | ORAL | Status: AC
  Administered 2023-08-31: 2.5 mg via ORAL
  Filled 2023-08-31: qty 1

## 2023-08-31 NOTE — Progress Notes (Addendum)
Mobility Specialist Progress Note:   08/31/23 1226  Mobility  Activity Stood at bedside  Level of Assistance Moderate assist, patient does 50-74%  Assistive Device Front wheel walker  Activity Response Tolerated well  Mobility Referral Yes  Mobility visit 1 Mobility  Mobility Specialist Start Time (ACUTE ONLY) 1120  Mobility Specialist Stop Time (ACUTE ONLY) 1145  Mobility Specialist Time Calculation (min) (ACUTE ONLY) 25 min   Pt received in bed, agreeable to mobility. Pt able to perform 2x STS with modA at bedside but pt unable to march in place d/t leg weakness. Pt displayed heavy cough during session and c/o slight SOB, otherwise asx throughout. VSS. Pt returned to supine position with call bell in reach and all needs met  Leory Plowman  Mobility Specialist Please contact via [facility] office at [phone]

## 2023-08-31 NOTE — Progress Notes (Addendum)
[facility]  PROGRESS NOTE  [name] VOZ:[id number] DOB: 12/26/1932 DOA: 08/25/2023 PCP: [facility]   Brief HPI:   89 y.o. female with medical history significant of "short term memory loss" per the husband, progresing for 6 months.  History obtained from patient over the phone   Patient is a resident of nursing home.  Patient's husband noted that he she was having black stools for the last couple of days and requested that the patient's blood be tested, which revealed severe anemia    Assessment/Plan:   GI bleed -Has history of multiple upper GI bleed in the past -Presented with severe anemia in setting of anticoagulation with warfarin for A-fib as well as aortic mechanical valve -Hemoglobin 6.3; status post blood transfusion -Hemoglobin improved to 9.0 ; now hemoglobin is down to 8.7 -Underwent enteroscopy. showed gastric AVM but no high risk stigmata for bleeding.  Family at this time is not interested in getting colonoscopy or repeat capsule endoscopy.  Plan to restart Coumadin. -Continue PPI,  -GI has signed off  Transaminitis -Mild elevation of LFTs; improving -Abdominal ultrasound shows cholelithiasis and possible liver cirrhosis -?  Amiodarone associated -Follow LFTs in a.m.  Bilateral wheezing/pulmonary edema/acute bronchitis -Resolved -Concern for pulmonary edema, IV fluids were discontinued -Chest x-ray showed pulmonary edema and small pleural effusion -BNP was 543, started on Lasix 20 mg IV twice daily; held due to soft BP -Started on DuoNebs every 6 hours scheduled and Solu-Medrol 40 mg IV twice daily, Mucinex 600 mg p.o. twice daily -Received IV Lasix 20 mg intermittently over the past 3 days -Will restart Lasix 40 mg p.o. daily from today, which is patient's home dose -Will discontinue Solu-Medrol   CKD stage IIIb/IV -Patient's creatinine improving with diuresis, today creatinine has improved to 1.61 -Lasix on hold due to  soft BP; Lasix will be restarted -Unclear baseline, last creatinine from January 2024 was 1.52 -Follow BMP in am  History of amnesia -Going on for past 6 months as per patient has been -TSH 2.352, B12 2,160  History of mechanical arctic valve -Supratherapeutic INR was reversed with FFP's on admission -Coumadin was restarted and INR was supratherapeutic last night despite holding Coumadin for past 3 days -INR was 5.0, received 2.5 mg po vitamin K   Medications     (feeding supplement) PROSource Plus  30 mL Oral BID BM   atorvastatin  40 mg Oral Daily   ezetimibe  10 mg Oral Daily   feeding supplement  1 Container Oral TID BM   guaiFENesin  600 mg Oral BID   ipratropium-albuterol  3 mL Nebulization Q6H WA   levothyroxine  88 mcg Oral Q0600   methylPREDNISolone (SOLU-MEDROL) injection  40 mg Intravenous Q12H   multivitamin with minerals  1 tablet Oral Daily   pantoprazole  40 mg Oral BID   sodium chloride flush  3 mL Intravenous Q12H   Warfarin - Pharmacist Dosing Inpatient   Does not apply q1600     Data Reviewed:   CBG:  No results for input(s): "GLUCAP" in the last 168 hours.  SpO2: 95 % O2 Flow Rate (L/min): 2 L/min FiO2 (%): 28 %    Vitals:   08/31/23 0500 08/31/23 0600 08/31/23 0800 08/31/23 0807  BP:   (!) 160/78   Pulse:      Resp: (!) 21 20 (!) 25   Temp:      TempSrc:      SpO2:    95%  Weight:  Height:          Data Reviewed:  Basic Metabolic Panel: Recent Labs  Lab 08/27/23 0302 08/28/23 0313 08/29/23 1404 08/30/23 0800 08/31/23 0305  NA 135 137 135 135 137  K 4.6 3.5 3.9 3.8 3.7  CL 101 103 100 100 102  CO2 21* 23 22 22 23   GLUCOSE 127* 110* 172* 164* 213*  BUN 60* 54* 53* 56* 61*  CREATININE 2.42* 1.98* 1.71* 1.62* 1.61*  CALCIUM 8.8* 8.6* 9.0 9.2 9.1    CBC: Recent Labs  Lab 08/26/23 0324 08/26/23 1751 08/27/23 0302 08/29/23 0247 08/30/23 0800 08/31/23 0305  WBC 4.2  --  6.5 4.7 7.9 8.4  HGB 9.0* 9.7* 9.2* 8.5* 8.9*  8.7*  HCT 28.7* 31.5* 29.4* 27.1* 27.6* 27.7*  MCV 96.6  --  95.8 95.4 94.5 95.5  PLT 181  --  175 145* 163 178    LFT Recent Labs  Lab 08/25/23 1859 08/27/23 0302 08/29/23 0247 08/30/23 0800 08/31/23 0305  AST 60* 171* 201* 99* 63*  ALT 51* 119* 187* 151* 123*  ALKPHOS 64 57 56 59 57  BILITOT 0.7 1.2* 1.3* 1.0 1.0  PROT 5.9* 5.9* 5.7* 5.9* 5.9*  ALBUMIN 3.1* 3.0* 2.9* 3.1* 3.1*     Antibiotics: Anti-infectives (From admission, onward)    Start     Dose/Rate Route Frequency Ordered Stop   08/25/23 2230  cefTRIAXone (ROCEPHIN) 2 g in sodium chloride 0.9 % 100 mL IVPB        2 g 200 mL/hr over 30 Minutes Intravenous Every 24 hours 08/25/23 2111 09/01/23 2229        DVT prophylaxis: SCDs  Code Status: Full code  Family Communication: Discussed with patient's husband at bedside   CONSULTS gastroenterology   Subjective   Breathing is significantly improved.  No longer requiring oxygen.  INR was supratherapeutic at 5.0 this morning received 2.5 mg vitamin K.  Objective    Physical Examination:  General-appears in no acute distress Heart-S1-S2, regular, no murmur auscultated Lungs-clear to auscultation bilaterally, no wheezing or crackles auscultated Abdomen-soft, nontender, no organomegaly Extremities-no edema in the lower extremities Neuro-alert, oriented x3, no focal deficit noted   Status is: Inpatient:          [name]   [facility] If 7PM-7AM, please contact night-coverage at [email or web address], Office  [phone]   08/31/2023, 8:54 AM  LOS: 6 days

## 2023-08-31 NOTE — Progress Notes (Signed)
PHARMACY - ANTICOAGULATION CONSULT NOTE  Pharmacy Consult for warfarin Indication: mechanical AVR, afib  Allergies  Allergen Reactions   Capzasin [Capsaicin] Other (See Comments)    CAUSED SKIN BURNS THAT LASTED FOR 30 DAYS   Darvon [Propoxyphene] Nausea And Vomiting   Lisinopril Cough   Septra [Sulfamethoxazole-Trimethoprim] Other (See Comments)    Increased INR   Voltaren [Diclofenac] Other (See Comments)    POSSIBLE CHANCE OF INTERNAL BLEEDING MAY RESULT   Warfarin And Related Other (See Comments)    ONLY TOLERATES BRAND   Tobrex [Tobramycin] Other (See Comments)    "Allergic" to this- exact reaction not cited   Trazodone And Nefazodone Other (See Comments)    Severe sweating   Zolpidem Tartrate Other (See Comments)    Hallucinations   Penicillins Rash    Did it involve swelling of the face/tongue/throat, SOB, or low BP? No Did it involve sudden or severe rash/hives, skin peeling, or any reaction on the inside of your mouth or nose? No Did you need to seek medical attention at a hospital or doctor's office? No When did it last happen?  15 years     If all above answers are "NO", may proceed with cephalosporin use.    Patient Measurements: Height: 5' (152.4 cm) Weight: 70 kg (154 lb 5.2 oz) IBW/kg (Calculated) : 45.5   Vital Signs: Temp: 97.5 F (36.4 C) (12/09 1151) Temp Source: Oral (12/09 1151) BP: 141/54 (12/09 1151) Pulse Rate: 93 (12/09 1151)  Labs: Recent Labs    08/29/23 0247 08/29/23 1404 08/30/23 0319 08/30/23 0800 08/31/23 0305  HGB 8.5*  --   --  8.9* 8.7*  HCT 27.1*  --   --  27.6* 27.7*  PLT 145*  --   --  163 178  LABPROT 30.3*  --  38.9*  --  50.5*  INR 2.9*  --  4.0*  --  5.5*  CREATININE  --  1.71*  --  1.62* 1.61*    Estimated Creatinine Clearance: 21.1 mL/min (A) (by C-G formula based on SCr of 1.61 mg/dL (H)).   Medical History: Past Medical History:  Diagnosis Date   Anemia    Anginal pain (HCC)    Arteriovenous malformation  of gastrointestinal tract    Arthritis    "fingers mostly" (04/12/2015)   Carotid artery disease (HCC)    Carotid US 3/17:  60-79% RICA; 40-59% LICA; Elevated bilateral subclavian artery velocities >>f/u 1 year. // Carotid US 4/18: R 40-59; L 1-39 >> FU 1 year // Carotid US 10/2018: R 40-59; L 1-39, L subclavian stenosis    CHF (congestive heart failure) (HCC)    Chronic kidney disease (CKD), stage III (moderate) (HCC)    Chronic lower GI bleeding    "today; last time was ~ 8 yr ago; used to have them often before that too" (02/11/2013)   Coronary artery disease    DJD (degenerative joint disease)    Dysrhythmia    Heart murmur    History of blood transfusion    "a few times over the years; usually related to my Coumadin" (04/12/2015   History of gout    Hyperlipemia    Hypertension    Macula lutea degeneration    Old MI (myocardial infarction)    "a coulple /dr in 02/2008; I never even knew I'd had them" (04/12/2015)   Type II diabetes mellitus (HCC)    no medications, diet controlled    Medications:  Medications Prior to Admission  Medication Sig Dispense  Refill Last Dose   amiodarone (PACERONE) 200 MG tablet Take 1 tablet by mouth once daily (Patient taking differently: Take 200 mg by mouth daily.) 90 tablet 2 08/25/2023   atorvastatin (LIPITOR) 40 MG tablet Take 1 tablet (40 mg total) by mouth daily. (Patient taking differently: Take 40 mg by mouth at bedtime.) 90 tablet 3 Past Week   Bevacizumab (AVASTIN IV) Inject 1 Dose into the vein every 8 (eight) weeks.   UNK   cephALEXin (KEFLEX) 500 MG capsule Take 2,000 mg by mouth as needed. As needed for dental prophylaxis   UNK   clonazePAM (KLONOPIN) 0.5 MG tablet Take 0.5 mg by mouth 2 (two) times daily.   08/25/2023   clonazePAM (KLONOPIN) 0.5 MG tablet Take 0.5 mg by mouth 2 (two) times daily as needed for anxiety.   UNK   Emollient (BAG BALM) OINT Apply 1 Application topically daily. Apply to bilateral lower extremities for dry, scaly  skin   08/25/2023   ezetimibe (ZETIA) 10 MG tablet Take 1 tablet by mouth once daily (Patient taking differently: Take 10 mg by mouth at bedtime.) 90 tablet 2 Past Week   FeFum-FePo-FA-B Cmp-C-Zn-Mn-Cu (SE-TAN PLUS) 162-115.2-1 MG CAPS Take 1 capsule by mouth in the morning and at bedtime.   08/25/2023   folic acid (FOLVITE) 400 MCG tablet Take 400 mcg by mouth in the morning.   08/25/2023   furosemide (LASIX) 40 MG tablet Take 40 -80 mg by mouth daily based on renal function (Patient taking differently: Take 40 mg by mouth 2 (two) times daily.) 90 tablet 3 08/25/2023   isosorbide mononitrate (IMDUR) 60 MG 24 hr tablet Take 1 tablet by mouth once daily (Patient taking differently: 60 mg daily.) 90 tablet 2 08/25/2023   levothyroxine (SYNTHROID) 88 MCG tablet Take 88 mcg by mouth at bedtime.   Past Week   losartan (COZAAR) 50 MG tablet Take 50 mg by mouth in the morning.   08/25/2023   memantine (NAMENDA) 5 MG tablet Take 5 mg by mouth 2 (two) times daily.   08/25/2023   Multiple Vitamin (MULTIVITAMIN WITH MINERALS) TABS Take 1 tablet by mouth daily with breakfast.   08/25/2023   Multiple Vitamins-Minerals (PRESERVISION AREDS 2 PO) Take 1 capsule by mouth 2 (two) times daily.   08/25/2023   Nutritional Supplements (BOOST GLUCOSE CONTROL PO) Take 1 Bottle by mouth daily.   08/25/2023   Omega-3 Fatty Acids (FISH OIL PO) Take 700 mg by mouth 2 (two) times daily.   08/25/2023   pantoprazole (PROTONIX) 40 MG tablet Take 40 mg by mouth daily.   Past Week   potassium chloride (MICRO-K) 10 MEQ CR capsule Take 20 mEq by mouth daily.   08/25/2023   sertraline (ZOLOFT) 25 MG tablet Take 25 mg by mouth daily.   08/25/2023   TYLENOL 500 MG tablet Take 500-1,000 mg by mouth every 6 (six) hours as needed for mild pain or headache.   UNK   warfarin (COUMADIN) 1 MG tablet Take 1 mg by mouth 3 (three) times a week. Give 1 tablet every Tues, Thurs, and Sat at 1700      warfarin (COUMADIN) 2 MG tablet Take 2 mg by mouth 4 (four)  times a week. Give 1 tablet every Mon, Wed, Fri, and Sun      warfarin (COUMADIN) 2 MG tablet Take 2 mg by mouth at bedtime.   Past Week   nitroGLYCERIN (NITROSTAT) 0.4 MG SL tablet DISSOLVE ONE TABLET UNDER THE TONGUE EVERY 5  MINUTES AS NEEDED FOR CHEST PAIN.  DO NOT EXCEED A TOTAL OF 3 DOSES IN 15 MINUTES (Patient taking differently: Place 0.4 mg under the tongue every 5 (five) minutes x 3 doses as needed for chest pain.) 25 tablet 5    oxyCODONE (OXY IR/ROXICODONE) 5 MG immediate release tablet Take 5 mg by mouth every 6 (six) hours as needed for moderate pain or severe pain.      Scheduled:   (feeding supplement) PROSource Plus  30 mL Oral BID BM   atorvastatin  40 mg Oral Daily   ezetimibe  10 mg Oral Daily   feeding supplement  1 Container Oral TID BM   guaiFENesin  600 mg Oral BID   ipratropium-albuterol  3 mL Nebulization BID   levothyroxine  88 mcg Oral Q0600   multivitamin with minerals  1 tablet Oral Daily   pantoprazole  40 mg Oral BID   sodium chloride flush  3 mL Intravenous Q12H   Warfarin - Pharmacist Dosing Inpatient   Does not apply q1600    Assessment: 26 you female with concern of GIB s/p EGD 12/04 with no bleeding source found on imaging. She is on warfarin for mechanical AVR and afib. Pharmacy consulted to resume warfarin.  Home warfarin dose: 2mg  MWFSu, 1mg  TTSa   12/9: INR up to 5.5 today - last warfarin dose 12/5 (doses held due to uptrending INR). LFTs elevated but improving. CBC stable. RN noted bleeding hemorrhoids on exam, but no blood in stool. Not much PO intake but taking Ensure/Breeze. Patient is s/p 2.5 mg PO Vitamin K dose this AM.   Goal of Therapy:  INR goal 2-2.5; keep closer to 2.0 per MD Monitor platelets by anticoagulation protocol: Yes   Plan:  -Hold warfarin due to elevated INR -Daily PT/INR  Jani Gravel, PharmD Clinical Pharmacist  08/31/2023 3:11 PM

## 2023-08-31 NOTE — Progress Notes (Signed)
Critical INR result of 5.5, Patient has no signs of bleeding, Dr. Orest Dikes was informed. Will continue to monitor.

## 2023-08-31 NOTE — Progress Notes (Signed)
  Received a call from patient's nurse this patient INR is elevated 5.5.  Patient initially has been presented with GI bleed initial hemoglobin of 6.3 which has improved to 8-9 after 2 unit of blood transfusion.  Patient has history of multiple GI bleed in the past.  She also has paroxysmal atrial fibrillation and mechanical aortic valve currently on warfarin. Initially Coumadin was on hold in the setting of GI bleed and patient was reversed with FFP. Patient does not have any active GI bleed for last 48 hours.  I am going EGD yesterday which showed gastric AVM but no high risk stigmata of bleeding.  Supratherapeutic INR: -Pharmacy already has been consulted for warfarin management.  She did not receive any oral warfarin for last 2 days in the setting of subtherapeutic INR INR this morning is 5.5.  After discussing with pharmacy giving oral vitamin K 2.5 mg today. -Pharmacy is helping to follow along with INR and warfarin dosing. -No indication for FFP at this moment as patient does not have any active GI bleed anymore.   Tereasa Coop, MD [facility] 08/31/2023, 5:10 AM

## 2023-09-01 DIAGNOSIS — K922 Gastrointestinal hemorrhage, unspecified: Secondary | ICD-10-CM | POA: Diagnosis not present

## 2023-09-01 DIAGNOSIS — Z7901 Long term (current) use of anticoagulants: Secondary | ICD-10-CM | POA: Diagnosis not present

## 2023-09-01 DIAGNOSIS — K921 Melena: Secondary | ICD-10-CM

## 2023-09-01 DIAGNOSIS — K31819 Angiodysplasia of stomach and duodenum without bleeding: Secondary | ICD-10-CM | POA: Diagnosis not present

## 2023-09-01 DIAGNOSIS — R748 Abnormal levels of other serum enzymes: Secondary | ICD-10-CM | POA: Diagnosis not present

## 2023-09-01 LAB — COMPREHENSIVE METABOLIC PANEL
ALT: 103 U/L — ABNORMAL HIGH (ref 0–44)
AST: 52 U/L — ABNORMAL HIGH (ref 15–41)
Albumin: 3.1 g/dL — ABNORMAL LOW (ref 3.5–5.0)
Alkaline Phosphatase: 60 U/L (ref 38–126)
Anion gap: 11 (ref 5–15)
BUN: 61 mg/dL — ABNORMAL HIGH (ref 8–23)
CO2: 23 mmol/L (ref 22–32)
Calcium: 9.2 mg/dL (ref 8.9–10.3)
Chloride: 104 mmol/L (ref 98–111)
Creatinine, Ser: 1.79 mg/dL — ABNORMAL HIGH (ref 0.44–1.00)
GFR, Estimated: 27 mL/min — ABNORMAL LOW (ref >60)
Glucose, Bld: 203 mg/dL — ABNORMAL HIGH (ref 70–99)
Potassium: 3.8 mmol/L (ref 3.5–5.1)
Sodium: 138 mmol/L (ref 135–145)
Total Bilirubin: 1.1 mg/dL (ref <1.2)
Total Protein: 5.9 g/dL — ABNORMAL LOW (ref 6.5–8.1)

## 2023-09-01 LAB — CBC
HCT: 30 % — ABNORMAL LOW (ref 36.0–46.0)
Hemoglobin: 9.3 g/dL — ABNORMAL LOW (ref 12.0–15.0)
MCH: 29.3 pg (ref 26.0–34.0)
MCHC: 31 g/dL (ref 30.0–36.0)
MCV: 94.6 fL (ref 80.0–100.0)
Platelets: 185 10*3/uL (ref 150–400)
RBC: 3.17 MIL/uL — ABNORMAL LOW (ref 3.87–5.11)
RDW: 17.6 % — ABNORMAL HIGH (ref 11.5–15.5)
WBC: 10.8 10*3/uL — ABNORMAL HIGH (ref 4.0–10.5)
nRBC: 0.8 % — ABNORMAL HIGH (ref 0.0–0.2)

## 2023-09-01 LAB — PROTIME-INR
INR: 2.4 — ABNORMAL HIGH (ref 0.8–1.2)
Prothrombin Time: 26.6 s — ABNORMAL HIGH (ref 11.4–15.2)

## 2023-09-01 MED ORDER — FUROSEMIDE 40 MG PO TABS
40.0000 mg | ORAL_TABLET | Freq: Every day | ORAL | Status: DC
  Administered 2023-09-01 – 2023-09-02 (×2): 40 mg via ORAL
  Filled 2023-09-01: qty 2
  Filled 2023-09-01: qty 1

## 2023-09-01 MED ORDER — WARFARIN 0.5 MG HALF TABLET
0.5000 mg | ORAL_TABLET | Freq: Once | ORAL | Status: AC
  Administered 2023-09-01: 0.5 mg via ORAL
  Filled 2023-09-01: qty 1

## 2023-09-01 NOTE — Progress Notes (Signed)
Mobility Specialist Progress Note:   09/01/23 1535  Mobility  Activity Stood at bedside  Level of Assistance Maximum assist, patient does 25-49%  Assistive Device Front wheel walker  Activity Response Tolerated well  Mobility Referral Yes  Mobility visit 1 Mobility  Mobility Specialist Start Time (ACUTE ONLY) 1405  Mobility Specialist Stop Time (ACUTE ONLY) 1430  Mobility Specialist Time Calculation (min) (ACUTE ONLY) 25 min   Pt received in bed, agreeable to mobility. Pt needing maxA for bed mobility and STS d/t general weakness. Pt very fatigued during session, often wheezing to catch breath. Pursed lip breathing encouraged. VSS throughout. Pt returned back to supine asymptomatic with call bell in reach and all needs met. Husband present.   Leory Plowman  Mobility Specialist Please contact via [employer] office at [phone]

## 2023-09-01 NOTE — Progress Notes (Signed)
[facility]  PROGRESS NOTE  [name] WGN:[id number] DOB: 11-08-1932 DOA: 08/25/2023 PCP: [facility]   Brief HPI:   89 y.o. female with medical history significant of "short term memory loss" per the husband, progresing for 6 months.  History obtained from patient over the phone   Patient is a resident of nursing home.  Patient's husband noted that he she was having black stools for the last couple of days and requested that the patient's blood be tested, which revealed severe anemia    Assessment/Plan:   GI bleed -Has history of multiple upper GI bleed in the past -Presented with severe anemia in setting of anticoagulation with warfarin for A-fib as well as aortic mechanical valve -On initial presentation hemoglobin 6.3; status post blood transfusion -Hemoglobin improved to 9.0 ; today hemoglobin is 9.3 -Underwent enteroscopy. showed gastric AVM but no high risk stigmata for bleeding.  Family at this time is not interested in getting colonoscopy or repeat capsule endoscopy.  Coumadin restarted . -Continue PPI twice daily -GI has signed off  Transaminitis -Mild elevation of LFTs; improving -Abdominal ultrasound shows cholelithiasis and possible liver cirrhosis -?  Amiodarone associated -Follow LFTs in a.m.  Bilateral wheezing/pulmonary edema/acute bronchitis -Resolved -Concern for pulmonary edema, IV fluids were discontinued -Chest x-ray showed pulmonary edema and small pleural effusion -BNP was 543, started on Lasix 20 mg IV intermittently -Started on DuoNebs every 6 hours scheduled and Solu-Medrol 40 mg IV twice daily, Mucinex 600 mg p.o. twice daily -IV steroids have been discontinued -Restart patient's home medication Lasix 40 mg p.o. daily   CKD stage IIIb/IV -Patient's creatinine improving with diuresis, today creatinine is up to 1.79 -Lasix restarted today -Follow BMP in am -This is close to patient's baseline   History of  amnesia -Going on for past 6 months as per patient has been -TSH 2.352, B12 2,160  History of mechanical arctic valve -Supratherapeutic INR was reversed with FFP's on admission -Coumadin was restarted and INR was supratherapeutic last night despite holding Coumadin for past 3 days -INR was 5.0, received 2.5 mg po vitamin K -INR improved to 2.4 this morning; pharmacy plan to give Coumadin 0.5 mg tonight   Medications     (feeding supplement) PROSource Plus  30 mL Oral BID BM   atorvastatin  40 mg Oral Daily   ezetimibe  10 mg Oral Daily   feeding supplement  1 Container Oral TID BM   guaiFENesin  600 mg Oral BID   ipratropium-albuterol  3 mL Nebulization BID   levothyroxine  88 mcg Oral Q0600   multivitamin with minerals  1 tablet Oral Daily   pantoprazole  40 mg Oral BID   sodium chloride flush  3 mL Intravenous Q12H   Warfarin - Pharmacist Dosing Inpatient   Does not apply q1600     Data Reviewed:   CBG:  No results for input(s): "GLUCAP" in the last 168 hours.  SpO2: 100 % O2 Flow Rate (L/min): 2 L/min FiO2 (%): 28 %    Vitals:   09/01/23 0029 09/01/23 0325 09/01/23 0359 09/01/23 0734  BP:  (!) 141/41 118/70 131/68  Pulse:  89  75  Resp:  20  20  Temp:  97.7 F (36.5 C)    TempSrc:  Oral    SpO2:  96%  100%  Weight: 74.2 kg  71.4 kg   Height:          Data Reviewed:  Basic Metabolic Panel: Recent Labs  Lab 08/28/23 0313 08/29/23 1404 08/30/23 0800 08/31/23 0305 09/01/23 0323  NA 137 135 135 137 138  K 3.5 3.9 3.8 3.7 3.8  CL 103 100 100 102 104  CO2 23 22 22 23 23   GLUCOSE 110* 172* 164* 213* 203*  BUN 54* 53* 56* 61* 61*  CREATININE 1.98* 1.71* 1.62* 1.61* 1.79*  CALCIUM 8.6* 9.0 9.2 9.1 9.2    CBC: Recent Labs  Lab 08/27/23 0302 08/29/23 0247 08/30/23 0800 08/31/23 0305 08/31/23 1822 09/01/23 0323  WBC 6.5 4.7 7.9 8.4  --  10.8*  HGB 9.2* 8.5* 8.9* 8.7* 9.0* 9.3*  HCT 29.4* 27.1* 27.6* 27.7* 28.8* 30.0*  MCV 95.8 95.4 94.5 95.5   --  94.6  PLT 175 145* 163 178  --  185    LFT Recent Labs  Lab 08/27/23 0302 08/29/23 0247 08/30/23 0800 08/31/23 0305 09/01/23 0323  AST 171* 201* 99* 63* 52*  ALT 119* 187* 151* 123* 103*  ALKPHOS 57 56 59 57 60  BILITOT 1.2* 1.3* 1.0 1.0 1.1  PROT 5.9* 5.7* 5.9* 5.9* 5.9*  ALBUMIN 3.0* 2.9* 3.1* 3.1* 3.1*     Antibiotics: Anti-infectives (From admission, onward)    Start     Dose/Rate Route Frequency Ordered Stop   08/25/23 2230  cefTRIAXone (ROCEPHIN) 2 g in sodium chloride 0.9 % 100 mL IVPB        2 g 200 mL/hr over 30 Minutes Intravenous Every 24 hours 08/25/23 2111 08/31/23 2203        DVT prophylaxis: SCDs  Code Status: Full code  Family Communication: Discussed with patient's husband at bedside   CONSULTS gastroenterology   Subjective   Denies shortness of breath.  Had 1 episode of pink-colored stool yesterday.  H&H was checked, hemoglobin remained stable above 9.  Hb is 9.3 this morning.  Objective    Physical Examination:  Appears in no acute distress Lungs clear to auscultation bilaterally S1-S2, regular, no murmur auscultated Abdomen is soft, nontender, no organomegaly   Status is: Inpatient:          [name]   [facility] If 7PM-7AM, please contact night-coverage at [email or web address], Office  [phone]   09/01/2023, 8:35 AM  LOS: 7 days

## 2023-09-01 NOTE — Progress Notes (Signed)
PHARMACY - ANTICOAGULATION CONSULT NOTE  Pharmacy Consult for warfarin Indication: mechanical AVR, afib  Allergies  Allergen Reactions   Capzasin [Capsaicin] Other (See Comments)    CAUSED SKIN BURNS THAT LASTED FOR 30 DAYS   Darvon [Propoxyphene] Nausea And Vomiting   Lisinopril Cough   Septra [Sulfamethoxazole-Trimethoprim] Other (See Comments)    Increased INR   Voltaren [Diclofenac] Other (See Comments)    POSSIBLE CHANCE OF INTERNAL BLEEDING MAY RESULT   Warfarin And Related Other (See Comments)    ONLY TOLERATES BRAND   Tobrex [Tobramycin] Other (See Comments)    "Allergic" to this- exact reaction not cited   Trazodone And Nefazodone Other (See Comments)    Severe sweating   Zolpidem Tartrate Other (See Comments)    Hallucinations   Penicillins Rash    Did it involve swelling of the face/tongue/throat, SOB, or low BP? No Did it involve sudden or severe rash/hives, skin peeling, or any reaction on the inside of your mouth or nose? No Did you need to seek medical attention at a hospital or doctor's office? No When did it last happen?  15 years     If all above answers are "NO", may proceed with cephalosporin use.    Patient Measurements: Height: 5' (152.4 cm) Weight: 71.4 kg (157 lb 6.5 oz) IBW/kg (Calculated) : 45.5   Vital Signs: Temp: 97.7 F (36.5 C) (12/10 0325) Temp Source: Oral (12/10 0325) BP: 131/68 (12/10 0734) Pulse Rate: 75 (12/10 0734)  Labs: Recent Labs    08/29/23 1404 08/30/23 0319 08/30/23 0800 08/31/23 0305 08/31/23 1822 09/01/23 0323  HGB   < >  --  8.9* 8.7* 9.0* 9.3*  HCT   < >  --  27.6* 27.7* 28.8* 30.0*  PLT  --   --  163 178  --  185  LABPROT  --  38.9*  --  50.5*  --  26.6*  INR  --  4.0*  --  5.5*  --  2.4*  CREATININE  --   --  1.62* 1.61*  --  1.79*   < > = values in this interval not displayed.    Estimated Creatinine Clearance: 19.2 mL/min (A) (by C-G formula based on SCr of 1.79 mg/dL (H)).   Medical History: Past  Medical History:  Diagnosis Date   Anemia    Anginal pain (HCC)    Arteriovenous malformation of gastrointestinal tract    Arthritis    "fingers mostly" (04/12/2015)   Carotid artery disease (HCC)    Carotid US 3/17:  60-79% RICA; 40-59% LICA; Elevated bilateral subclavian artery velocities >>f/u 1 year. // Carotid US 4/18: R 40-59; L 1-39 >> FU 1 year // Carotid US 10/2018: R 40-59; L 1-39, L subclavian stenosis    CHF (congestive heart failure) (HCC)    Chronic kidney disease (CKD), stage III (moderate) (HCC)    Chronic lower GI bleeding    "today; last time was ~ 8 yr ago; used to have them often before that too" (02/11/2013)   Coronary artery disease    DJD (degenerative joint disease)    Dysrhythmia    Heart murmur    History of blood transfusion    "a few times over the years; usually related to my Coumadin" (04/12/2015   History of gout    Hyperlipemia    Hypertension    Macula lutea degeneration    Old MI (myocardial infarction)    "a coulple /dr in 02/2008; I never even knew I'd had  them" (04/12/2015)   Type II diabetes mellitus (HCC)    no medications, diet controlled    Medications:  Medications Prior to Admission  Medication Sig Dispense Refill Last Dose   amiodarone (PACERONE) 200 MG tablet Take 1 tablet by mouth once daily (Patient taking differently: Take 200 mg by mouth daily.) 90 tablet 2 08/25/2023   atorvastatin (LIPITOR) 40 MG tablet Take 1 tablet (40 mg total) by mouth daily. (Patient taking differently: Take 40 mg by mouth at bedtime.) 90 tablet 3 Past Week   Bevacizumab (AVASTIN IV) Inject 1 Dose into the vein every 8 (eight) weeks.   UNK   cephALEXin (KEFLEX) 500 MG capsule Take 2,000 mg by mouth as needed. As needed for dental prophylaxis   UNK   clonazePAM (KLONOPIN) 0.5 MG tablet Take 0.5 mg by mouth 2 (two) times daily.   08/25/2023   clonazePAM (KLONOPIN) 0.5 MG tablet Take 0.5 mg by mouth 2 (two) times daily as needed for anxiety.   UNK   Emollient (BAG  BALM) OINT Apply 1 Application topically daily. Apply to bilateral lower extremities for dry, scaly skin   08/25/2023   ezetimibe (ZETIA) 10 MG tablet Take 1 tablet by mouth once daily (Patient taking differently: Take 10 mg by mouth at bedtime.) 90 tablet 2 Past Week   FeFum-FePo-FA-B Cmp-C-Zn-Mn-Cu (SE-TAN PLUS) 162-115.2-1 MG CAPS Take 1 capsule by mouth in the morning and at bedtime.   08/25/2023   folic acid (FOLVITE) 400 MCG tablet Take 400 mcg by mouth in the morning.   08/25/2023   furosemide (LASIX) 40 MG tablet Take 40 -80 mg by mouth daily based on renal function (Patient taking differently: Take 40 mg by mouth 2 (two) times daily.) 90 tablet 3 08/25/2023   isosorbide mononitrate (IMDUR) 60 MG 24 hr tablet Take 1 tablet by mouth once daily (Patient taking differently: 60 mg daily.) 90 tablet 2 08/25/2023   levothyroxine (SYNTHROID) 88 MCG tablet Take 88 mcg by mouth at bedtime.   Past Week   losartan (COZAAR) 50 MG tablet Take 50 mg by mouth in the morning.   08/25/2023   memantine (NAMENDA) 5 MG tablet Take 5 mg by mouth 2 (two) times daily.   08/25/2023   Multiple Vitamin (MULTIVITAMIN WITH MINERALS) TABS Take 1 tablet by mouth daily with breakfast.   08/25/2023   Multiple Vitamins-Minerals (PRESERVISION AREDS 2 PO) Take 1 capsule by mouth 2 (two) times daily.   08/25/2023   Nutritional Supplements (BOOST GLUCOSE CONTROL PO) Take 1 Bottle by mouth daily.   08/25/2023   Omega-3 Fatty Acids (FISH OIL PO) Take 700 mg by mouth 2 (two) times daily.   08/25/2023   pantoprazole (PROTONIX) 40 MG tablet Take 40 mg by mouth daily.   Past Week   potassium chloride (MICRO-K) 10 MEQ CR capsule Take 20 mEq by mouth daily.   08/25/2023   sertraline (ZOLOFT) 25 MG tablet Take 25 mg by mouth daily.   08/25/2023   TYLENOL 500 MG tablet Take 500-1,000 mg by mouth every 6 (six) hours as needed for mild pain or headache.   UNK   warfarin (COUMADIN) 1 MG tablet Take 1 mg by mouth 3 (three) times a week. Give 1 tablet  every Tues, Thurs, and Sat at 1700      warfarin (COUMADIN) 2 MG tablet Take 2 mg by mouth 4 (four) times a week. Give 1 tablet every Mon, Wed, Fri, and Sun      warfarin (COUMADIN) 2  MG tablet Take 2 mg by mouth at bedtime.   Past Week   nitroGLYCERIN (NITROSTAT) 0.4 MG SL tablet DISSOLVE ONE TABLET UNDER THE TONGUE EVERY 5 MINUTES AS NEEDED FOR CHEST PAIN.  DO NOT EXCEED A TOTAL OF 3 DOSES IN 15 MINUTES (Patient taking differently: Place 0.4 mg under the tongue every 5 (five) minutes x 3 doses as needed for chest pain.) 25 tablet 5    oxyCODONE (OXY IR/ROXICODONE) 5 MG immediate release tablet Take 5 mg by mouth every 6 (six) hours as needed for moderate pain or severe pain.      Scheduled:   (feeding supplement) PROSource Plus  30 mL Oral BID BM   atorvastatin  40 mg Oral Daily   ezetimibe  10 mg Oral Daily   feeding supplement  1 Container Oral TID BM   guaiFENesin  600 mg Oral BID   ipratropium-albuterol  3 mL Nebulization BID   levothyroxine  88 mcg Oral Q0600   multivitamin with minerals  1 tablet Oral Daily   pantoprazole  40 mg Oral BID   sodium chloride flush  3 mL Intravenous Q12H   Warfarin - Pharmacist Dosing Inpatient   Does not apply q1600    Assessment:  32 you female with concern of GIB s/p EGD 12/04 with no bleeding source found on imaging. She is on warfarin for mechanical AVR and afib. Pharmacy consulted to resume warfarin.  Home warfarin dose: 2mg  MWFSu, 1mg  TTSa.  Outpt INR goal 2.-2.5 d/t hx GIB.  INR 2.4 s/p 5mg  PO Vit K 12/9.  Hgb remains stable.  Patient appears sensitive to warfarin doses in the setting of limited PO intake and liver disease.  Will order reduced dose of warfarin tonight.  Based on inpatient response, likely need to reduce / adjust outpatient regimen on discharge.  Goal of Therapy:  INR goal 2-2.5; keep closer to 2.0 per MD Monitor platelets by anticoagulation protocol: Yes   Plan:  -Warfarin 0.5mg  PO x 1 tonight -Daily PT/INR   [place], Pharm.D., BCPS Clinical Pharmacist  **Pharmacist phone directory can be found on [email or web address] listed under [facility].  09/01/2023 8:43 AM

## 2023-09-01 NOTE — TOC Progression Note (Addendum)
Transition of Care [facility]) - Progression Note    Patient Details  Name: [name] MRN: [id number] Date of Birth: 03/18/1933  [facility] CM/SW Contact  Eduard Roux, [place] Phone Number: 09/01/2023, 11:03 AM  Clinical Narrative:     Patient arrived from [facility]/ [facility] - informed SNF anticipate d/c tomorrow  TOC continues to follow   Antony Blackbird, MSW, LCSW Clinical Social Worker        Expected Discharge Plan and Services                                               Social Determinants of Health (SDOH) Interventions SDOH Screenings   Food Insecurity: No Food Insecurity (08/26/2023)  Housing: Patient Unable To Answer (08/26/2023)  Transportation Needs: No Transportation Needs (08/26/2023)  Utilities: Not At Risk (08/26/2023)  Tobacco Use: Low Risk  (08/26/2023)    Readmission Risk Interventions     No data to display

## 2023-09-01 NOTE — Progress Notes (Signed)
Nutrition Follow-up  DOCUMENTATION CODES:   Not applicable  INTERVENTION:  Continue regular diet as ordered Boost Breeze po TID, each supplement provides 250 kcal and 9 grams of protein Magic cup TID with meals, each supplement provides 290 kcal and 9 grams of protein 30 ml ProSource Plus BID, each supplement provides 100 kcals and 15 grams protein.  MVI with minerals daily  NUTRITION DIAGNOSIS:  Increased nutrient needs related to acute illness as evidenced by estimated needs. - Remains applicable  GOAL:  Patient will meet greater than or equal to 90% of their needs - goal unmet  MONITOR:  PO intake, Supplement acceptance, Labs, Weight trends, I & O's  REASON FOR ASSESSMENT:  Consult Poor PO  ASSESSMENT:  89 y.o. female with a past medical history of arthritis, hypertension, hyperlipidemia, coronary artery disease s/p CABG 1990, chronic diastolic CHF, paroxysmal atrial fibrillation, s/p AVR (mechanical valve) on Warfarin 1993, carotid artery disease, CKD stage IV, diabetes mellitus type 2, cellulitis, macrocytic anemia, GI bleed secondary to H. 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 Husband noted patient was passing black stools for a few days. Pt with short term memory impairment.  INR improving. Lasix restarted today. Noted plans for possible d/c tomorrow.   Pt sitting up in bed with husband present at bedside. Breakfast tray on table with 50% yogurt, 50% muffin and 90% of banana consumed. Pt's husband reports that her baseline is 3 small meals with very seldom snacking. She has always been a "light eater." On admission, he reports that she was not eating at all which was concerning. She reports that her appetite is slightly improved. Denies nausea, vomiting, abd pain.   Pt denies difficulty chewing/swallowing but mentions that she has to take small bites and takes time to chew her foods well.   Limited meal completions on file to review.   She does not  like Ensure. RN reports that she is hardly drinking Boost Breeze but will consume ProSource mixed with water per pt preference and is consuming at least 50% of each magic cup. Pt's husband reports that he has been trying to encourage her to consume at least a portion of the nutrition supplements for nutritional benefits.   Admit weight: 71.8 kg Current weight: 71.4 kg  Medications: lasix 40mg  daily, MVI, protonix, warfarin  Labs:  BUN 61 Cr 1.79 AST 52  ALT 103 GFR 27  NUTRITION - FOCUSED PHYSICAL EXAM: Flowsheet Row Most Recent Value  Orbital Region No depletion  Upper Arm Region No depletion  Thoracic and Lumbar Region No depletion  Buccal Region No depletion  Temple Region Mild depletion  Clavicle Bone Region No depletion  Clavicle and Acromion Bone Region No depletion  Scapular Bone Region Mild depletion  Dorsal Hand Moderate depletion  Patellar Region Mild depletion  Anterior Thigh Region Mild depletion  Posterior Calf Region Mild depletion  Edema (RD Assessment) Mild  [BLE]  Hair Reviewed  Eyes Reviewed  Mouth Reviewed  Skin Other (Comment)  [bruising,  on blood thinners]  Nails Reviewed   Diet Order:   Diet Order             Diet regular Fluid consistency: Thin  Diet effective now                   EDUCATION NEEDS:  No education needs have been identified at this time  Skin:  Skin Assessment: Reviewed RN Assessment  Last BM:  12/10 type 5 large  Height:  Ht Readings from Last 1 Encounters:  08/26/23 5' (1.524 m)   Weight:  Wt Readings from Last 1 Encounters:  09/01/23 71.4 kg   BMI:  Body mass index is 30.74 kg/m.  Estimated Nutritional Needs:  Kcal:  1700-1900 Protein:  75-85g Fluid:  1.7L/day  Drusilla Kanner, RDN, LDN Clinical Nutrition

## 2023-09-02 DIAGNOSIS — F411 Generalized anxiety disorder: Secondary | ICD-10-CM | POA: Diagnosis not present

## 2023-09-02 DIAGNOSIS — H353221 Exudative age-related macular degeneration, left eye, with active choroidal neovascularization: Secondary | ICD-10-CM | POA: Diagnosis not present

## 2023-09-02 DIAGNOSIS — L853 Xerosis cutis: Secondary | ICD-10-CM | POA: Diagnosis not present

## 2023-09-02 DIAGNOSIS — Z951 Presence of aortocoronary bypass graft: Secondary | ICD-10-CM | POA: Diagnosis not present

## 2023-09-02 DIAGNOSIS — K922 Gastrointestinal hemorrhage, unspecified: Secondary | ICD-10-CM | POA: Diagnosis not present

## 2023-09-02 DIAGNOSIS — E785 Hyperlipidemia, unspecified: Secondary | ICD-10-CM | POA: Diagnosis not present

## 2023-09-02 DIAGNOSIS — J811 Chronic pulmonary edema: Secondary | ICD-10-CM | POA: Diagnosis not present

## 2023-09-02 DIAGNOSIS — I2510 Atherosclerotic heart disease of native coronary artery without angina pectoris: Secondary | ICD-10-CM | POA: Diagnosis not present

## 2023-09-02 DIAGNOSIS — N1832 Chronic kidney disease, stage 3b: Secondary | ICD-10-CM | POA: Diagnosis not present

## 2023-09-02 DIAGNOSIS — I5030 Unspecified diastolic (congestive) heart failure: Secondary | ICD-10-CM | POA: Diagnosis not present

## 2023-09-02 DIAGNOSIS — K31811 Angiodysplasia of stomach and duodenum with bleeding: Secondary | ICD-10-CM | POA: Diagnosis not present

## 2023-09-02 DIAGNOSIS — E039 Hypothyroidism, unspecified: Secondary | ICD-10-CM | POA: Diagnosis not present

## 2023-09-02 DIAGNOSIS — I499 Cardiac arrhythmia, unspecified: Secondary | ICD-10-CM | POA: Diagnosis not present

## 2023-09-02 DIAGNOSIS — R748 Abnormal levels of other serum enzymes: Secondary | ICD-10-CM | POA: Diagnosis not present

## 2023-09-02 DIAGNOSIS — H43813 Vitreous degeneration, bilateral: Secondary | ICD-10-CM | POA: Diagnosis not present

## 2023-09-02 DIAGNOSIS — E113293 Type 2 diabetes mellitus with mild nonproliferative diabetic retinopathy without macular edema, bilateral: Secondary | ICD-10-CM | POA: Diagnosis not present

## 2023-09-02 DIAGNOSIS — D62 Acute posthemorrhagic anemia: Secondary | ICD-10-CM | POA: Diagnosis not present

## 2023-09-02 DIAGNOSIS — Z7401 Bed confinement status: Secondary | ICD-10-CM | POA: Diagnosis not present

## 2023-09-02 DIAGNOSIS — D6869 Other thrombophilia: Secondary | ICD-10-CM | POA: Diagnosis not present

## 2023-09-02 DIAGNOSIS — Z7901 Long term (current) use of anticoagulants: Secondary | ICD-10-CM | POA: Diagnosis not present

## 2023-09-02 DIAGNOSIS — K921 Melena: Secondary | ICD-10-CM | POA: Diagnosis not present

## 2023-09-02 DIAGNOSIS — H353112 Nonexudative age-related macular degeneration, right eye, intermediate dry stage: Secondary | ICD-10-CM | POA: Diagnosis not present

## 2023-09-02 DIAGNOSIS — F0390 Unspecified dementia without behavioral disturbance: Secondary | ICD-10-CM | POA: Diagnosis not present

## 2023-09-02 DIAGNOSIS — I5032 Chronic diastolic (congestive) heart failure: Secondary | ICD-10-CM | POA: Diagnosis not present

## 2023-09-02 DIAGNOSIS — R413 Other amnesia: Secondary | ICD-10-CM | POA: Diagnosis not present

## 2023-09-02 DIAGNOSIS — R079 Chest pain, unspecified: Secondary | ICD-10-CM | POA: Diagnosis not present

## 2023-09-02 DIAGNOSIS — I359 Nonrheumatic aortic valve disorder, unspecified: Secondary | ICD-10-CM | POA: Diagnosis not present

## 2023-09-02 DIAGNOSIS — R0602 Shortness of breath: Secondary | ICD-10-CM | POA: Diagnosis not present

## 2023-09-02 DIAGNOSIS — R059 Cough, unspecified: Secondary | ICD-10-CM | POA: Diagnosis not present

## 2023-09-02 DIAGNOSIS — Z8616 Personal history of COVID-19: Secondary | ICD-10-CM | POA: Diagnosis not present

## 2023-09-02 DIAGNOSIS — Z5181 Encounter for therapeutic drug level monitoring: Secondary | ICD-10-CM | POA: Diagnosis not present

## 2023-09-02 DIAGNOSIS — Z952 Presence of prosthetic heart valve: Secondary | ICD-10-CM | POA: Diagnosis not present

## 2023-09-02 DIAGNOSIS — F419 Anxiety disorder, unspecified: Secondary | ICD-10-CM | POA: Diagnosis not present

## 2023-09-02 DIAGNOSIS — K5900 Constipation, unspecified: Secondary | ICD-10-CM | POA: Diagnosis not present

## 2023-09-02 DIAGNOSIS — E611 Iron deficiency: Secondary | ICD-10-CM | POA: Diagnosis not present

## 2023-09-02 DIAGNOSIS — I10 Essential (primary) hypertension: Secondary | ICD-10-CM | POA: Diagnosis not present

## 2023-09-02 DIAGNOSIS — R600 Localized edema: Secondary | ICD-10-CM | POA: Diagnosis not present

## 2023-09-02 DIAGNOSIS — Q2733 Arteriovenous malformation of digestive system vessel: Secondary | ICD-10-CM | POA: Diagnosis not present

## 2023-09-02 DIAGNOSIS — I4891 Unspecified atrial fibrillation: Secondary | ICD-10-CM | POA: Diagnosis not present

## 2023-09-02 DIAGNOSIS — H35371 Puckering of macula, right eye: Secondary | ICD-10-CM | POA: Diagnosis not present

## 2023-09-02 DIAGNOSIS — I480 Paroxysmal atrial fibrillation: Secondary | ICD-10-CM | POA: Diagnosis not present

## 2023-09-02 LAB — COMPREHENSIVE METABOLIC PANEL
ALT: 99 U/L — ABNORMAL HIGH (ref 0–44)
AST: 64 U/L — ABNORMAL HIGH (ref 15–41)
Albumin: 3.1 g/dL — ABNORMAL LOW (ref 3.5–5.0)
Alkaline Phosphatase: 64 U/L (ref 38–126)
Anion gap: 10 (ref 5–15)
BUN: 54 mg/dL — ABNORMAL HIGH (ref 8–23)
CO2: 22 mmol/L (ref 22–32)
Calcium: 8.8 mg/dL — ABNORMAL LOW (ref 8.9–10.3)
Chloride: 103 mmol/L (ref 98–111)
Creatinine, Ser: 1.53 mg/dL — ABNORMAL HIGH (ref 0.44–1.00)
GFR, Estimated: 33 mL/min — ABNORMAL LOW (ref >60)
Glucose, Bld: 137 mg/dL — ABNORMAL HIGH (ref 70–99)
Potassium: 3.4 mmol/L — ABNORMAL LOW (ref 3.5–5.1)
Sodium: 135 mmol/L (ref 135–145)
Total Bilirubin: 1.3 mg/dL — ABNORMAL HIGH (ref <1.2)
Total Protein: 5.9 g/dL — ABNORMAL LOW (ref 6.5–8.1)

## 2023-09-02 LAB — PROTIME-INR
INR: 2 — ABNORMAL HIGH (ref 0.8–1.2)
Prothrombin Time: 22.5 s — ABNORMAL HIGH (ref 11.4–15.2)

## 2023-09-02 LAB — CBC
HCT: 29.1 % — ABNORMAL LOW (ref 36.0–46.0)
Hemoglobin: 9.3 g/dL — ABNORMAL LOW (ref 12.0–15.0)
MCH: 29.8 pg (ref 26.0–34.0)
MCHC: 32 g/dL (ref 30.0–36.0)
MCV: 93.3 fL (ref 80.0–100.0)
Platelets: 151 10*3/uL (ref 150–400)
RBC: 3.12 MIL/uL — ABNORMAL LOW (ref 3.87–5.11)
RDW: 17.6 % — ABNORMAL HIGH (ref 11.5–15.5)
WBC: 8.3 10*3/uL (ref 4.0–10.5)
nRBC: 0 % (ref 0.0–0.2)

## 2023-09-02 MED ORDER — ALBUTEROL SULFATE (2.5 MG/3ML) 0.083% IN NEBU
2.5000 mg | INHALATION_SOLUTION | RESPIRATORY_TRACT | Status: DC | PRN
  Administered 2023-09-02: 2.5 mg via RESPIRATORY_TRACT
  Filled 2023-09-02: qty 3

## 2023-09-02 MED ORDER — WARFARIN SODIUM 1 MG PO TABS: 0.5000 mg | ORAL_TABLET | Freq: Every day | ORAL | Status: AC

## 2023-09-02 MED ORDER — CLONAZEPAM 0.5 MG PO TABS: 0.5000 mg | ORAL_TABLET | Freq: Two times a day (BID) | ORAL | 0 refills | Status: AC | PRN

## 2023-09-02 MED ORDER — POLYETHYLENE GLYCOL 3350 17 G PO PACK: 17.0000 g | PACK | Freq: Every day | ORAL | Status: AC | PRN

## 2023-09-02 MED ORDER — GUAIFENESIN ER 600 MG PO TB12: 600.0000 mg | ORAL_TABLET | Freq: Two times a day (BID) | ORAL | Status: AC

## 2023-09-02 MED ORDER — WARFARIN 0.5 MG HALF TABLET
0.5000 mg | ORAL_TABLET | Freq: Once | ORAL | Status: DC
Start: 2023-09-02 — End: 2023-09-02
  Filled 2023-09-02: qty 1

## 2023-09-02 MED ORDER — OXYCODONE HCL 5 MG PO TABS: 5.0000 mg | ORAL_TABLET | Freq: Four times a day (QID) | ORAL | 0 refills | Status: AC | PRN

## 2023-09-02 MED ORDER — WARFARIN SODIUM 1 MG PO TABS: 1.0000 mg | ORAL_TABLET | Freq: Every day | ORAL | Status: DC

## 2023-09-02 MED ORDER — ALBUTEROL SULFATE (2.5 MG/3ML) 0.083% IN NEBU: 2.5000 mg | INHALATION_SOLUTION | RESPIRATORY_TRACT | Status: AC | PRN

## 2023-09-02 MED ORDER — PANTOPRAZOLE SODIUM 40 MG PO TBEC: 40.0000 mg | DELAYED_RELEASE_TABLET | Freq: Two times a day (BID) | ORAL | Status: AC

## 2023-09-02 NOTE — Care Management Important Message (Signed)
Important Message  Patient Details  Name: [name] MRN: [id number] Date of Birth: 03-Jul-1933   Important Message Given:  Yes - Medicare IM     Sherilyn Banker 09/02/2023, 2:21 PM

## 2023-09-02 NOTE — Progress Notes (Signed)
Patient report given to Daniel,RN of [facility], all questions were answered

## 2023-09-02 NOTE — TOC Transition Note (Signed)
Transition of Care [facility]) - CM/SW Discharge Note   Patient Details  Name: [name] MRN: [id number] Date of Birth: [birth year]  [facility] CM/SW Contact:  Eduard Roux, LCSW Phone Number: 09/02/2023, 12:57 PM   Clinical Narrative:     Patient will Discharge [facility] [place] Discharge Date: 09/02/2023 Family Notified: spouse- MD spoke with spouse regarding d/c as requested  Transport [id number]  Per MD patient is ready for discharge. RN, patient, and facility notified of discharge. Discharge Summary sent to facility. RN given number for report.[phone], Rm [phone] Ambulance transport requested for patient.   Clinical Social Worker signing off.  Antony Blackbird, MSW, LCSW Clinical Social Worker       Barriers to Discharge: Barriers Resolved   Patient Goals and CMS Choice      Discharge Placement                Patient chooses bed at: [facility] [place] Patient to be transferred to facility by: [facility] Name of family member notified: spouse Patient and family notified of of transfer: 09/02/23  Discharge Plan and Services Additional resources added to the After Visit Summary for                                       Social Determinants of Health (SDOH) Interventions SDOH Screenings   Food Insecurity: No Food Insecurity (08/26/2023)  Housing: Patient Unable To Answer (08/26/2023)  Transportation Needs: No Transportation Needs (08/26/2023)  Utilities: Not At Risk (08/26/2023)  Tobacco Use: Low Risk  (08/26/2023)     Readmission Risk Interventions     No data to display

## 2023-09-02 NOTE — Discharge Summary (Signed)
Physician Discharge Summary  [name] ZOX:[id number] DOB: 08/16/1933 DOA: 08/25/2023  PCP: [facility]  Admit date: 08/25/2023 Discharge date: 09/02/2023  Admitted From: SNF Discharge disposition: needs SNF   Recommendations for Outpatient Follow-Up:   INR- Friday and then PRN after (but at least every 2 weeks) Cbc/CMP 1 week Consider palliative care referral for GOC/code status discussions    Discharge Diagnosis:   Principal Problem:   Gastrointestinal hemorrhage Active Problems:   Acute post-hemorrhagic anemia   H/O mechanical aortic valve replacement   Anticoagulated   Melena   Amnesia   Swelling   GI bleed   Gastric AVM   Elevated liver enzymes    Discharge Condition: Improved.  Diet recommendation: Regular.  Wound care: None.  Code status: Full.   History of Present Illness:   89 y.o. female with medical history significant of "short term memory loss" per the husband, progresing for 6 months.  History obtained from patient over the phone   Patient is a resident of nursing home.  Patient's husband noted that he she was having black stools for the last couple of days and requested that the patient's blood be tested, which revealed severe anemia     Hospital Course by Problem:   GI bleed -Has history of multiple upper GI bleed in the past -Presented with severe anemia in setting of anticoagulation with warfarin for A-fib as well as aortic mechanical valve -On initial presentation hemoglobin 6.3; status post blood transfusion -Hemoglobin improved to 9.0 ; today hemoglobin is 9.3 -Underwent enteroscopy. showed gastric AVM but no high risk stigmata for bleeding.  Family at this time is not interested in getting colonoscopy or repeat capsule endoscopy.  Coumadin restarted . -Continue PPI twice daily -GI has signed off   Transaminitis -Mild elevation of LFTs; improving -Abdominal ultrasound shows cholelithiasis and  possible liver cirrhosis -?  Amiodarone associated -Follow LFTs outpatient   Bilateral wheezing/pulmonary edema/acute bronchitis -Resolved -Concern for pulmonary edema, IV fluids were discontinued -Chest x-ray showed pulmonary edema and small pleural effusion -BNP was 543, started on Lasix 20 mg IV intermittently with improvement    CKD stage IIIb/IV -Patient's creatinine improving with diuresis -outpatient follow up     History of amnesia -Going on for past 6 months as per patient has been -TSH 2.352, B12 2,160   History of mechanical arctic valve -Supratherapeutic INR was reversed with FFP's on admission -Coumadin was restarted and INR was supratherapeutic last night despite holding Coumadin for past 3 days -would recommend a lower dose and very close monitoring    Medical Consultants:    GI  Discharge Exam:   Vitals:   09/02/23 0820 09/02/23 1105  BP: 133/75 (!) 122/48  Pulse: 96 96  Resp: 19 18  Temp: 97.6 F (36.4 C) (!) 97.2 F (36.2 C)  SpO2: 98% 97%   Vitals:   09/02/23 0341 09/02/23 0820 09/02/23 1100 09/02/23 1105  BP: (!) 141/86 133/75  (!) 122/48  Pulse: 84 96  96  Resp: 20 19  18   Temp: (!) 97.5 F (36.4 C) 97.6 F (36.4 C)  (!) 97.2 F (36.2 C)  TempSrc: Oral Oral  Oral  SpO2: 95% 98%  97%  Weight: 71.5 kg  70.2 kg   Height:        General exam: Appears calm and comfortable.   The results of significant diagnostics from this hospitalization (including imaging, microbiology, ancillary and laboratory) are listed below for reference.  Procedures and Diagnostic Studies:   DG Chest Port 1V same Day  Result Date: 08/26/2023 CLINICAL DATA:  Shortness of breath. EXAM: PORTABLE CHEST 1 VIEW COMPARISON:  Chest radiograph dated 10/07/2022 FINDINGS: The there is cardiomegaly with vascular congestion. Bibasilar atelectasis. Pneumonia is not excluded. No pneumothorax. Median sternotomy wires and CABG vascular clips. No acute osseous pathology.  IMPRESSION: 1. Cardiomegaly with vascular congestion. 2. Bibasilar atelectasis. Electronically Signed   By: Elgie Collard M.D.   On: 08/26/2023 21:43     Labs:   Basic Metabolic Panel: Recent Labs  Lab 08/29/23 1404 08/30/23 0800 08/31/23 0305 09/01/23 0323 09/02/23 0340  NA 135 135 137 138 135  K 3.9 3.8 3.7 3.8 3.4*  CL 100 100 102 104 103  CO2 22 22 23 23 22   GLUCOSE 172* 164* 213* 203* 137*  BUN 53* 56* 61* 61* 54*  CREATININE 1.71* 1.62* 1.61* 1.79* 1.53*  CALCIUM 9.0 9.2 9.1 9.2 8.8*   GFR Estimated Creatinine Clearance: 22.2 mL/min (A) (by C-G formula based on SCr of 1.53 mg/dL (H)). Liver Function Tests: Recent Labs  Lab 08/29/23 0247 08/30/23 0800 08/31/23 0305 09/01/23 0323 09/02/23 0340  AST 201* 99* 63* 52* 64*  ALT 187* 151* 123* 103* 99*  ALKPHOS 56 59 57 60 64  BILITOT 1.3* 1.0 1.0 1.1 1.3*  PROT 5.7* 5.9* 5.9* 5.9* 5.9*  ALBUMIN 2.9* 3.1* 3.1* 3.1* 3.1*   No results for input(s): "LIPASE", "AMYLASE" in the last 168 hours. No results for input(s): "AMMONIA" in the last 168 hours. Coagulation profile Recent Labs  Lab 08/29/23 0247 08/30/23 0319 08/31/23 0305 09/01/23 0323 09/02/23 0340  INR 2.9* 4.0* 5.5* 2.4* 2.0*    CBC: Recent Labs  Lab 08/29/23 0247 08/30/23 0800 08/31/23 0305 08/31/23 1822 09/01/23 0323 09/02/23 0340  WBC 4.7 7.9 8.4  --  10.8* 8.3  HGB 8.5* 8.9* 8.7* 9.0* 9.3* 9.3*  HCT 27.1* 27.6* 27.7* 28.8* 30.0* 29.1*  MCV 95.4 94.5 95.5  --  94.6 93.3  PLT 145* 163 178  --  185 151   Cardiac Enzymes: No results for input(s): "CKTOTAL", "CKMB", "CKMBINDEX", "TROPONINI" in the last 168 hours. BNP: Invalid input(s): "POCBNP" CBG: No results for input(s): "GLUCAP" in the last 168 hours. D-Dimer No results for input(s): "DDIMER" in the last 72 hours. Hgb A1c No results for input(s): "HGBA1C" in the last 72 hours. Lipid Profile No results for input(s): "CHOL", "HDL", "LDLCALC", "TRIG", "CHOLHDL", "LDLDIRECT" in the  last 72 hours. Thyroid function studies No results for input(s): "TSH", "T4TOTAL", "T3FREE", "THYROIDAB" in the last 72 hours.  Invalid input(s): "FREET3" Anemia work up No results for input(s): "VITAMINB12", "FOLATE", "FERRITIN", "TIBC", "IRON", "RETICCTPCT" in the last 72 hours. Microbiology No results found for this or any previous visit (from the past 240 hour(s)).   Discharge Instructions:   Discharge Instructions     Diet general   Complete by: As directed    Increase activity slowly   Complete by: As directed       Allergies as of 09/02/2023       Reactions   Capzasin [capsaicin] Other (See Comments)   CAUSED SKIN BURNS THAT LASTED FOR 30 DAYS   Darvon [propoxyphene] Nausea And Vomiting   Lisinopril Cough   Septra [sulfamethoxazole-trimethoprim] Other (See Comments)   Increased INR   Voltaren [diclofenac] Other (See Comments)   POSSIBLE CHANCE OF INTERNAL BLEEDING MAY RESULT   Warfarin And Related Other (See Comments)   ONLY TOLERATES BRAND   Tobrex [  tobramycin] Other (See Comments)   "Allergic" to this- exact reaction not cited   Trazodone And Nefazodone Other (See Comments)   Severe sweating   Zolpidem Tartrate Other (See Comments)   Hallucinations   Penicillins Rash   Did it involve swelling of the face/tongue/throat, SOB, or low BP? No Did it involve sudden or severe rash/hives, skin peeling, or any reaction on the inside of your mouth or nose? No Did you need to seek medical attention at a hospital or doctor's office? No When did it last happen?  15 years     If all above answers are "NO", may proceed with cephalosporin use.        Medication List     STOP taking these medications    losartan 50 MG tablet Commonly known as: COZAAR       TAKE these medications    albuterol (2.5 MG/3ML) 0.083% nebulizer solution Commonly known as: PROVENTIL Take 3 mLs (2.5 mg total) by nebulization every 4 (four) hours as needed for wheezing or shortness of  breath.   amiodarone 200 MG tablet Commonly known as: PACERONE Take 1 tablet by mouth once daily   atorvastatin 40 MG tablet Commonly known as: LIPITOR Take 1 tablet (40 mg total) by mouth daily. What changed: when to take this   AVASTIN IV Inject 1 Dose into the vein every 8 (eight) weeks.   Bag Balm Oint Apply 1 Application topically daily. Apply to bilateral lower extremities for dry, scaly skin   BOOST GLUCOSE CONTROL PO Take 1 Bottle by mouth daily.   cephALEXin 500 MG capsule Commonly known as: KEFLEX Take 2,000 mg by mouth as needed. As needed for dental prophylaxis   clonazePAM 0.5 MG tablet Commonly known as: KLONOPIN Take 1 tablet (0.5 mg total) by mouth 2 (two) times daily as needed for anxiety. What changed: Another medication with the same name was removed. Continue taking this medication, and follow the directions you see here.   ezetimibe 10 MG tablet Commonly known as: ZETIA Take 1 tablet by mouth once daily What changed: when to take this   FISH OIL PO Take 700 mg by mouth 2 (two) times daily.   folic acid 400 MCG tablet Commonly known as: FOLVITE Take 400 mcg by mouth in the morning.   furosemide 40 MG tablet Commonly known as: LASIX Take 40 -80 mg by mouth daily based on renal function What changed:  how much to take how to take this when to take this additional instructions   guaiFENesin 600 MG 12 hr tablet Commonly known as: MUCINEX Take 1 tablet (600 mg total) by mouth 2 (two) times daily.   isosorbide mononitrate 60 MG 24 hr tablet Commonly known as: IMDUR Take 1 tablet by mouth once daily What changed: how to take this   levothyroxine 88 MCG tablet Commonly known as: SYNTHROID Take 88 mcg by mouth at bedtime.   memantine 5 MG tablet Commonly known as: NAMENDA Take 5 mg by mouth 2 (two) times daily.   multivitamin with minerals Tabs tablet Take 1 tablet by mouth daily with breakfast.   nitroGLYCERIN 0.4 MG SL  tablet Commonly known as: NITROSTAT DISSOLVE ONE TABLET UNDER THE TONGUE EVERY 5 MINUTES AS NEEDED FOR CHEST PAIN.  DO NOT EXCEED A TOTAL OF 3 DOSES IN 15 MINUTES What changed: See the new instructions.   oxyCODONE 5 MG immediate release tablet Commonly known as: Oxy IR/ROXICODONE Take 1 tablet (5 mg total) by mouth every 6 (  six) hours as needed for moderate pain (pain score 4-6) or severe pain (pain score 7-10).   pantoprazole 40 MG tablet Commonly known as: PROTONIX Take 1 tablet (40 mg total) by mouth 2 (two) times daily. What changed: when to take this   polyethylene glycol 17 g packet Commonly known as: MIRALAX / GLYCOLAX Take 17 g by mouth daily as needed for mild constipation.   potassium chloride 10 MEQ CR capsule Commonly known as: MICRO-K Take 20 mEq by mouth daily.   PRESERVISION AREDS 2 PO Take 1 capsule by mouth 2 (two) times daily.   Se-Tan PLUS 162-115.2-1 MG Caps Take 1 capsule by mouth in the morning and at bedtime.   sertraline 25 MG tablet Commonly known as: ZOLOFT Take 25 mg by mouth daily.   TYLENOL 500 MG tablet Generic drug: acetaminophen Take 500-1,000 mg by mouth every 6 (six) hours as needed for mild pain or headache.   warfarin 1 MG tablet Commonly known as: COUMADIN Take as directed. If you are unsure how to take this medication, talk to your nurse or doctor. Original instructions: Take 0.5 tablets (0.5 mg total) by mouth daily at 4 PM. Give 0.5mg  tablet daily What changed:  how much to take when to take this additional instructions Another medication with the same name was removed. Continue taking this medication, and follow the directions you see here.          Time coordinating discharge: 45 min  Signed:  Joseph Art DO  [facility] 09/02/2023, 12:31 PM

## 2023-09-02 NOTE — Progress Notes (Signed)
PHARMACY - ANTICOAGULATION CONSULT NOTE  Pharmacy Consult for warfarin Indication: mechanical AVR, afib  Allergies  Allergen Reactions   Capzasin [Capsaicin] Other (See Comments)    CAUSED SKIN BURNS THAT LASTED FOR 30 DAYS   Darvon [Propoxyphene] Nausea And Vomiting   Lisinopril Cough   Septra [Sulfamethoxazole-Trimethoprim] Other (See Comments)    Increased INR   Voltaren [Diclofenac] Other (See Comments)    POSSIBLE CHANCE OF INTERNAL BLEEDING MAY RESULT   Warfarin And Related Other (See Comments)    ONLY TOLERATES BRAND   Tobrex [Tobramycin] Other (See Comments)    "Allergic" to this- exact reaction not cited   Trazodone And Nefazodone Other (See Comments)    Severe sweating   Zolpidem Tartrate Other (See Comments)    Hallucinations   Penicillins Rash    Did it involve swelling of the face/tongue/throat, SOB, or low BP? No Did it involve sudden or severe rash/hives, skin peeling, or any reaction on the inside of your mouth or nose? No Did you need to seek medical attention at a hospital or doctor's office? No When did it last happen?  15 years     If all above answers are "NO", may proceed with cephalosporin use.    Patient Measurements: Height: 5' (152.4 cm) Weight: 71.5 kg (157 lb 10.1 oz) IBW/kg (Calculated) : 45.5   Vital Signs: Temp: 97.5 F (36.4 C) (12/11 0341) Temp Source: Oral (12/11 0341) BP: 141/86 (12/11 0341) Pulse Rate: 84 (12/11 0341)  Labs: Recent Labs    08/31/23 0305 08/31/23 1822 09/01/23 0323 09/02/23 0340  HGB 8.7* 9.0* 9.3* 9.3*  HCT 27.7* 28.8* 30.0* 29.1*  PLT 178  --  185 151  LABPROT 50.5*  --  26.6* 22.5*  INR 5.5*  --  2.4* 2.0*  CREATININE 1.61*  --  1.79* 1.53*    Estimated Creatinine Clearance: 22.4 mL/min (A) (by C-G formula based on SCr of 1.53 mg/dL (H)).   Medical History: Past Medical History:  Diagnosis Date   Anemia    Anginal pain (HCC)    Arteriovenous malformation of gastrointestinal tract    Arthritis     "fingers mostly" (04/12/2015)   Carotid artery disease (HCC)    Carotid US 3/17:  60-79% RICA; 40-59% LICA; Elevated bilateral subclavian artery velocities >>f/u 1 year. // Carotid US 4/18: R 40-59; L 1-39 >> FU 1 year // Carotid US 10/2018: R 40-59; L 1-39, L subclavian stenosis    CHF (congestive heart failure) (HCC)    Chronic kidney disease (CKD), stage III (moderate) (HCC)    Chronic lower GI bleeding    "today; last time was ~ 8 yr ago; used to have them often before that too" (02/11/2013)   Coronary artery disease    DJD (degenerative joint disease)    Dysrhythmia    Heart murmur    History of blood transfusion    "a few times over the years; usually related to my Coumadin" (04/12/2015   History of gout    Hyperlipemia    Hypertension    Macula lutea degeneration    Old MI (myocardial infarction)    "a coulple /dr in 02/2008; I never even knew I'd had them" (04/12/2015)   Type II diabetes mellitus (HCC)    no medications, diet controlled    Medications:  Medications Prior to Admission  Medication Sig Dispense Refill Last Dose   amiodarone (PACERONE) 200 MG tablet Take 1 tablet by mouth once daily (Patient taking differently: Take 200 mg by  mouth daily.) 90 tablet 2 08/25/2023   atorvastatin (LIPITOR) 40 MG tablet Take 1 tablet (40 mg total) by mouth daily. (Patient taking differently: Take 40 mg by mouth at bedtime.) 90 tablet 3 Past Week   Bevacizumab (AVASTIN IV) Inject 1 Dose into the vein every 8 (eight) weeks.   UNK   cephALEXin (KEFLEX) 500 MG capsule Take 2,000 mg by mouth as needed. As needed for dental prophylaxis   UNK   clonazePAM (KLONOPIN) 0.5 MG tablet Take 0.5 mg by mouth 2 (two) times daily.   08/25/2023   clonazePAM (KLONOPIN) 0.5 MG tablet Take 0.5 mg by mouth 2 (two) times daily as needed for anxiety.   UNK   Emollient (BAG BALM) OINT Apply 1 Application topically daily. Apply to bilateral lower extremities for dry, scaly skin   08/25/2023   ezetimibe (ZETIA) 10  MG tablet Take 1 tablet by mouth once daily (Patient taking differently: Take 10 mg by mouth at bedtime.) 90 tablet 2 Past Week   FeFum-FePo-FA-B Cmp-C-Zn-Mn-Cu (SE-TAN PLUS) 162-115.2-1 MG CAPS Take 1 capsule by mouth in the morning and at bedtime.   08/25/2023   folic acid (FOLVITE) 400 MCG tablet Take 400 mcg by mouth in the morning.   08/25/2023   furosemide (LASIX) 40 MG tablet Take 40 -80 mg by mouth daily based on renal function (Patient taking differently: Take 40 mg by mouth 2 (two) times daily.) 90 tablet 3 08/25/2023   isosorbide mononitrate (IMDUR) 60 MG 24 hr tablet Take 1 tablet by mouth once daily (Patient taking differently: 60 mg daily.) 90 tablet 2 08/25/2023   levothyroxine (SYNTHROID) 88 MCG tablet Take 88 mcg by mouth at bedtime.   Past Week   losartan (COZAAR) 50 MG tablet Take 50 mg by mouth in the morning.   08/25/2023   memantine (NAMENDA) 5 MG tablet Take 5 mg by mouth 2 (two) times daily.   08/25/2023   Multiple Vitamin (MULTIVITAMIN WITH MINERALS) TABS Take 1 tablet by mouth daily with breakfast.   08/25/2023   Multiple Vitamins-Minerals (PRESERVISION AREDS 2 PO) Take 1 capsule by mouth 2 (two) times daily.   08/25/2023   Nutritional Supplements (BOOST GLUCOSE CONTROL PO) Take 1 Bottle by mouth daily.   08/25/2023   Omega-3 Fatty Acids (FISH OIL PO) Take 700 mg by mouth 2 (two) times daily.   08/25/2023   pantoprazole (PROTONIX) 40 MG tablet Take 40 mg by mouth daily.   Past Week   potassium chloride (MICRO-K) 10 MEQ CR capsule Take 20 mEq by mouth daily.   08/25/2023   sertraline (ZOLOFT) 25 MG tablet Take 25 mg by mouth daily.   08/25/2023   TYLENOL 500 MG tablet Take 500-1,000 mg by mouth every 6 (six) hours as needed for mild pain or headache.   UNK   warfarin (COUMADIN) 1 MG tablet Take 1 mg by mouth 3 (three) times a week. Give 1 tablet every Tues, Thurs, and Sat at 1700      warfarin (COUMADIN) 2 MG tablet Take 2 mg by mouth 4 (four) times a week. Give 1 tablet every Mon, Wed,  Fri, and Sun      warfarin (COUMADIN) 2 MG tablet Take 2 mg by mouth at bedtime.   Past Week   nitroGLYCERIN (NITROSTAT) 0.4 MG SL tablet DISSOLVE ONE TABLET UNDER THE TONGUE EVERY 5 MINUTES AS NEEDED FOR CHEST PAIN.  DO NOT EXCEED A TOTAL OF 3 DOSES IN 15 MINUTES (Patient taking differently: Place 0.4 mg  under the tongue every 5 (five) minutes x 3 doses as needed for chest pain.) 25 tablet 5    oxyCODONE (OXY IR/ROXICODONE) 5 MG immediate release tablet Take 5 mg by mouth every 6 (six) hours as needed for moderate pain or severe pain.      Scheduled:   (feeding supplement) PROSource Plus  30 mL Oral BID BM   atorvastatin  40 mg Oral Daily   ezetimibe  10 mg Oral Daily   feeding supplement  1 Container Oral TID BM   furosemide  40 mg Oral Daily   guaiFENesin  600 mg Oral BID   levothyroxine  88 mcg Oral Q0600   multivitamin with minerals  1 tablet Oral Daily   pantoprazole  40 mg Oral BID   sodium chloride flush  3 mL Intravenous Q12H   Warfarin - Pharmacist Dosing Inpatient   Does not apply q1600    Assessment:  52 you female with concern of GIB s/p EGD 12/04 with no bleeding source found on imaging. She is on warfarin for mechanical AVR and afib. Pharmacy consulted to resume warfarin.  Home warfarin dose: 2mg  MWFSu, 1mg  TTSa.  Outpt INR goal 2.-2.5 d/t hx GIB.  INR 2.0 s/p 2.5mg  PO Vit K 12/9.  Hgb remains stable.  Patient appears sensitive to warfarin doses in the setting of limited PO intake and liver disease.  Will order reduced dose of warfarin tonight.  Based on inpatient response, likely need to reduce / adjust outpatient regimen on discharge.  Goal of Therapy:  INR goal 2-2.5; keep closer to 2.0 per MD Monitor platelets by anticoagulation protocol: Yes   Plan:  -Warfarin 0.5mg  PO x 1 tonight -Daily PT/INR   Ruben Im, PharmD Clinical Pharmacist 09/02/2023 7:34 AM Please check AMION for all [facility] Pharmacy numbers

## 2023-09-03 DIAGNOSIS — J811 Chronic pulmonary edema: Secondary | ICD-10-CM | POA: Diagnosis not present

## 2023-09-03 DIAGNOSIS — F411 Generalized anxiety disorder: Secondary | ICD-10-CM | POA: Diagnosis not present

## 2023-09-03 DIAGNOSIS — Z952 Presence of prosthetic heart valve: Secondary | ICD-10-CM | POA: Diagnosis not present

## 2023-09-03 DIAGNOSIS — I5030 Unspecified diastolic (congestive) heart failure: Secondary | ICD-10-CM | POA: Diagnosis not present

## 2023-09-03 DIAGNOSIS — I480 Paroxysmal atrial fibrillation: Secondary | ICD-10-CM | POA: Diagnosis not present

## 2023-09-03 DIAGNOSIS — E785 Hyperlipidemia, unspecified: Secondary | ICD-10-CM | POA: Diagnosis not present

## 2023-09-03 DIAGNOSIS — E039 Hypothyroidism, unspecified: Secondary | ICD-10-CM | POA: Diagnosis not present

## 2023-09-03 DIAGNOSIS — R059 Cough, unspecified: Secondary | ICD-10-CM | POA: Diagnosis not present

## 2023-09-03 DIAGNOSIS — F0390 Unspecified dementia without behavioral disturbance: Secondary | ICD-10-CM | POA: Diagnosis not present

## 2023-09-03 DIAGNOSIS — D6869 Other thrombophilia: Secondary | ICD-10-CM | POA: Diagnosis not present

## 2023-09-03 DIAGNOSIS — K31811 Angiodysplasia of stomach and duodenum with bleeding: Secondary | ICD-10-CM | POA: Diagnosis not present

## 2023-09-03 DIAGNOSIS — I10 Essential (primary) hypertension: Secondary | ICD-10-CM | POA: Diagnosis not present

## 2023-09-28 ENCOUNTER — Encounter: Payer: Self-pay | Admitting: Cardiology

## 2023-09-29 ENCOUNTER — Ambulatory Visit: Payer: Medicare Other | Admitting: Cardiology

## 2023-10-02 ENCOUNTER — Ambulatory Visit: Payer: Medicare Other | Attending: Cardiology | Admitting: Cardiology

## 2023-10-02 ENCOUNTER — Ambulatory Visit: Payer: Medicare Other | Admitting: Pharmacist

## 2023-10-02 ENCOUNTER — Ambulatory Visit (INDEPENDENT_AMBULATORY_CARE_PROVIDER_SITE_OTHER): Payer: Medicare Other

## 2023-10-02 ENCOUNTER — Encounter: Payer: Self-pay | Admitting: Cardiology

## 2023-10-02 VITALS — BP 106/62 | HR 94 | Ht 60.0 in | Wt 151.0 lb

## 2023-10-02 DIAGNOSIS — I480 Paroxysmal atrial fibrillation: Secondary | ICD-10-CM | POA: Diagnosis not present

## 2023-10-02 DIAGNOSIS — I2510 Atherosclerotic heart disease of native coronary artery without angina pectoris: Secondary | ICD-10-CM | POA: Diagnosis not present

## 2023-10-02 DIAGNOSIS — I5032 Chronic diastolic (congestive) heart failure: Secondary | ICD-10-CM | POA: Diagnosis not present

## 2023-10-02 DIAGNOSIS — Z5181 Encounter for therapeutic drug level monitoring: Secondary | ICD-10-CM | POA: Insufficient documentation

## 2023-10-02 DIAGNOSIS — Z952 Presence of prosthetic heart valve: Secondary | ICD-10-CM | POA: Insufficient documentation

## 2023-10-02 DIAGNOSIS — Z7901 Long term (current) use of anticoagulants: Secondary | ICD-10-CM | POA: Diagnosis not present

## 2023-10-02 DIAGNOSIS — I4891 Unspecified atrial fibrillation: Secondary | ICD-10-CM | POA: Insufficient documentation

## 2023-10-02 DIAGNOSIS — I10 Essential (primary) hypertension: Secondary | ICD-10-CM | POA: Diagnosis not present

## 2023-10-02 LAB — POCT INR: INR: 1.3 — AB (ref 2.0–3.0)

## 2023-10-02 NOTE — Progress Notes (Signed)
 Cardiology Office Note:    Date:  10/02/2023   ID:  [name], DOB 26-Nov-1932, MRN [id number]  PCP:  [facility]  Cardiologist:  Candyce Reek, MD  Electrophysiologist:  None   Referring MD: No ref. provider found   Chief Complaint  Patient presents with   Atrial Fibrillation    History of Present Illness:    [name] is a 89 y.o. female with a hx of CAD status post CABG with subsequent PCI to RCA with BMS, mechanical AVR, hypertension, hyperlipidemia, diabetes, GI bleeding, CKD, paroxysmal atrial fibrillation, chronic diastolic heart failure.  She previously followed with Dr. Reek, last seen 04/2023.  She underwent CABG with mechanical AVR in [place] in the 90s.  She was evaluated in 03/2015 for anginal symptoms.  Cath showed severe native vessel disease, patent LIMA-LAD and SVG-diagonal, occluded SVG-OM/PDA (second half of OM/PDA graft was open), 75% ostial RCA stenosis treated with DES.  She was admitted with sepsis due to E. coli bacteremia from UTI 11/2018.  TTE suggested possible mitral valve vegetation, TEE was negative for vegetations.  She underwent DCCV 07/2019 for A-fib.  She was admitted with GI bleeding 10/2019.  She was admitted again with GI bleeding 09/02/2023.  Hemoglobin 6.3 on presentation, improved with transfusion was 9.3 on discharge.  Enteroscopy showed gastric AVMs but no high risk stigmata for bleeding, family declined colonoscopy or repeat capsule endoscopy.  She was restarted on Coumadin .  Also volume overloaded during admission, she was diuresed with IV Lasix .  Echocardiogram 08/27/2023 showed EF 55 to 60%, low normal RV function, mild left atrial enlargement, moderate right atrial enlargement, severe tricuspid regurgitation, mechanical aortic valve with mean gradient 8 mmHg.  Since discharge from the hospital, she reports she is doing okay.  Reports no further bleeding.  Most recent INR 1.3 on 09/02/2023.  She denies any chest  pain or dyspnea.  Reports chronic right lower extremity edema since her CABG.  She denies any lightheadedness or syncope.  Denies any palpitations.  Since her hospitalization in December she is doing physical therapy but has been wheelchair-bound.  Prior to this hospitalization she was walking with walker.    Wt Readings from Last 3 Encounters:  10/02/23 151 lb (68.5 kg)  09/02/23 154 lb 12.2 oz (70.2 kg)  10/31/22 151 lb (68.5 kg)     Past Medical History:  Diagnosis Date   Anemia    Anginal pain (HCC)    Arteriovenous malformation of gastrointestinal tract    Arthritis    fingers mostly (04/12/2015)   Carotid artery disease (HCC)    Carotid US  3/17:  60-79% RICA; 40-59% LICA; Elevated bilateral subclavian artery velocities >>f/u 1 year. // Carotid US  4/18: R 40-59; L 1-39 >> FU 1 year // Carotid US  10/2018: R 40-59; L 1-39, L subclavian stenosis    CHF (congestive heart failure) (HCC)    Chronic kidney disease (CKD), stage III (moderate) (HCC)    Chronic lower GI bleeding    today; last time was ~ 8 yr ago; used to have them often before that too (02/11/2013)   Coronary artery disease    DJD (degenerative joint disease)    Dysrhythmia    Heart murmur    History of blood transfusion    a few times over the years; usually related to my Coumadin  (04/12/2015   History of gout    Hyperlipemia    Hypertension    Macula lutea degeneration    Old MI (  myocardial infarction)    a coulple /dr in 02/2008; I never even knew I'd had them (04/12/2015)   Type II diabetes mellitus (HCC)    no medications, diet controlled    Past Surgical History:  Procedure Laterality Date   AORTIC VALVE REPLACEMENT  09/23/1991   St. Jude   APPENDECTOMY  09/23/1951   BIOPSY  11/06/2019   Procedure: BIOPSY;  Surgeon: Legrand Victory LITTIE DOUGLAS, MD;  Location: [facility] ENDOSCOPY;  Service: Gastroenterology;;   CARDIAC CATHETERIZATION  ~1990   CARDIAC CATHETERIZATION  04/12/2015   Procedure: Coronary Stent  Intervention;  Surgeon: Candyce GORMAN Reek, MD;    SYNERGY DES 3.5X16 to the ostial RCA    CARDIAC CATHETERIZATION  04/12/2015   Procedure: Coronary/Graft Angiography;  Surgeon: Candyce GORMAN Kotyk, MD; LAD & CFX 100%, patent LIMA-LAD, SVG-D1; SVG-OM-PDA first limb 100%, 2nd limb patent; oRCA 75%>0 w/ stent   CARDIAC VALVE REPLACEMENT  09/22/1992   St. Jude/notes 10/29/2003 (02/11/2013)   CARDIOVERSION N/A 08/15/2019   Procedure: CARDIOVERSION;  Surgeon: Alveta Aleene PARAS, MD;  Location: [facility] ENDOSCOPY;  Service: Cardiovascular;  Laterality: N/A;   COLONOSCOPY N/A 02/13/2013   Procedure: COLONOSCOPY;  Surgeon: Princella CHRISTELLA Nida, MD;  Location: [facility] ENDOSCOPY;  Service: Endoscopy;  Laterality: N/A;   CORONARY ANGIOPLASTY WITH STENT PLACEMENT  2009+   3 at least; put in 1 stent at a time (02/11/2013)   CORONARY ARTERY BYPASS GRAFT  09/22/1988   LIMA-LAD, SVG-OM-PDA, SVG-D1   DILATION AND CURETTAGE OF UTERUS  05/24/1959   'after a miscarriage (02/11/2013)   ENTEROSCOPY N/A 02/13/2013   Procedure: ENTEROSCOPY;  Surgeon: Princella CHRISTELLA Nida, MD;  Location: [facility] ENDOSCOPY;  Service: Endoscopy;  Laterality: N/A;   ENTEROSCOPY N/A 11/06/2019   Procedure: ENTEROSCOPY;  Surgeon: Legrand Victory LITTIE DOUGLAS, MD;  Location: [facility] ENDOSCOPY;  Service: Gastroenterology;  Laterality: N/A;   ENTEROSCOPY N/A 08/26/2023   Procedure: ENTEROSCOPY;  Surgeon: Leigh Elspeth SQUIBB, MD;  Location: [facility] ENDOSCOPY;  Service: Gastroenterology;  Laterality: N/A;   ESOPHAGOGASTRODUODENOSCOPY (EGD) WITH PROPOFOL  N/A 12/01/2019   Procedure: ESOPHAGOGASTRODUODENOSCOPY (EGD) WITH PROPOFOL ;  Surgeon: Shila Gustav GAILS, MD;  Location: [facility] ENDOSCOPY;  Service: Endoscopy;  Laterality: N/A;   EXPLORATORY LAPAROTOMY  02/24/2008   which revealed a retroperitoneal hematoma and bleeding from the right external iliac artery/notes 03/02/2008 (02/11/2013)    FLEXIBLE SIGMOIDOSCOPY N/A 04/21/2015   Procedure: FLEXIBLE SIGMOIDOSCOPY;  Surgeon: Gwendlyn ONEIDA Buddy, MD;   Location: [facility] ENDOSCOPY;  Service: Endoscopy;  Laterality: N/A;   HOT HEMOSTASIS N/A 11/06/2019   Procedure: HOT HEMOSTASIS (ARGON PLASMA COAGULATION/BICAP);  Surgeon: Legrand Victory LITTIE DOUGLAS, MD;  Location: [facility] ENDOSCOPY;  Service: Gastroenterology;  Laterality: N/A;   HOT HEMOSTASIS N/A 08/26/2023   Procedure: HOT HEMOSTASIS (ARGON PLASMA COAGULATION/BICAP);  Surgeon: Leigh Elspeth SQUIBB, MD;  Location: [facility] ENDOSCOPY;  Service: Gastroenterology;  Laterality: N/A;   LUMBAR LAMINECTOMY/DECOMPRESSION MICRODISCECTOMY Left 06/02/2022   Procedure: LEFT LUMBAR FOUR-FIVE MICRODISCECTOMY;  Surgeon: Mavis Purchase, MD;  [facility] OR;  Service: Neurosurgery;  Laterality: Left;  3C   TEE WITHOUT CARDIOVERSION N/A 12/01/2018   Procedure: TRANSESOPHAGEAL ECHOCARDIOGRAM (TEE);  Surgeon: Jeffrie Oneil BROCKS, MD;  Location: [facility] ENDOSCOPY;  Service: Cardiovascular;  Laterality: N/A;    Current Medications: Current Meds  Medication Sig   albuterol  (PROVENTIL ) (2.5 MG/3ML) 0.083% nebulizer solution Take 3 mLs (2.5 mg total) by nebulization every 4 (four) hours as needed for wheezing or shortness of breath.   amiodarone  (PACERONE ) 200 MG tablet Take 1 tablet by mouth once daily (Patient taking  differently: Take 200 mg by mouth daily.)   atorvastatin  (LIPITOR ) 40 MG tablet Take 1 tablet (40 mg total) by mouth daily. (Patient taking differently: Take 40 mg by mouth at bedtime.)   Bevacizumab  (AVASTIN  IV) Inject 1 Dose into the vein every 8 (eight) weeks.   cephALEXin  (KEFLEX ) 500 MG capsule Take 2,000 mg by mouth as needed. As needed for dental prophylaxis   clonazePAM  (KLONOPIN ) 0.5 MG tablet Take 1 tablet (0.5 mg total) by mouth 2 (two) times daily as needed for anxiety.   Emollient (BAG BALM) OINT Apply 1 Application topically daily. Apply to bilateral lower extremities for dry, scaly skin   ezetimibe  (ZETIA ) 10 MG tablet Take 1 tablet by mouth once daily (Patient taking differently: Take 10 mg by mouth at bedtime.)    FeFum-FePo-FA-B Cmp-C-Zn-Mn-Cu (SE-TAN PLUS ) 162-115.2-1 MG CAPS Take 1 capsule by mouth in the morning and at bedtime.   folic acid  (FOLVITE ) 400 MCG tablet Take 400 mcg by mouth in the morning.   guaiFENesin  (MUCINEX ) 600 MG 12 hr tablet Take 1 tablet (600 mg total) by mouth 2 (two) times daily.   isosorbide  mononitrate (IMDUR ) 60 MG 24 hr tablet Take 1 tablet by mouth once daily (Patient taking differently: 60 mg daily.)   levothyroxine  (SYNTHROID ) 88 MCG tablet Take 88 mcg by mouth at bedtime.   memantine (NAMENDA) 5 MG tablet Take 5 mg by mouth 2 (two) times daily.   Multiple Vitamin (MULTIVITAMIN WITH MINERALS) TABS Take 1 tablet by mouth daily with breakfast.   Multiple Vitamins-Minerals (PRESERVISION AREDS 2 PO) Take 1 capsule by mouth 2 (two) times daily.   nitroGLYCERIN  (NITROSTAT ) 0.4 MG SL tablet DISSOLVE ONE TABLET UNDER THE TONGUE EVERY 5 MINUTES AS NEEDED FOR CHEST PAIN.  DO NOT EXCEED A TOTAL OF 3 DOSES IN 15 MINUTES (Patient taking differently: Place 0.4 mg under the tongue every 5 (five) minutes x 3 doses as needed for chest pain.)   Nutritional Supplements (BOOST GLUCOSE CONTROL PO) Take 1 Bottle by mouth daily.   Omega-3 Fatty Acids (FISH OIL PO) Take 700 mg by mouth 2 (two) times daily.   oxyCODONE  (OXY IR/ROXICODONE ) 5 MG immediate release tablet Take 1 tablet (5 mg total) by mouth every 6 (six) hours as needed for moderate pain (pain score 4-6) or severe pain (pain score 7-10).   pantoprazole  (PROTONIX ) 40 MG tablet Take 1 tablet (40 mg total) by mouth 2 (two) times daily.   polyethylene glycol (MIRALAX  / GLYCOLAX ) 17 g packet Take 17 g by mouth daily as needed for mild constipation.   potassium chloride  (MICRO-K ) 10 MEQ CR capsule Take 20 mEq by mouth daily.   sertraline (ZOLOFT) 25 MG tablet Take 25 mg by mouth daily.   torsemide (DEMADEX) 20 MG tablet Take 20 mg by mouth 2 (two) times daily.   TYLENOL  500 MG tablet Take 500-1,000 mg by mouth every 6 (six) hours as needed  for mild pain or headache.   warfarin (COUMADIN ) 1 MG tablet Take 0.5 tablets (0.5 mg total) by mouth daily at 4 PM. Give 0.5mg  tablet daily   [DISCONTINUED] furosemide  (LASIX ) 40 MG tablet Take 40 -80 mg by mouth daily based on renal function (Patient taking differently: Take 40 mg by mouth 2 (two) times daily.)     Allergies:   Capzasin [capsaicin], Darvon [propoxyphene], Lisinopril, Septra [sulfamethoxazole-trimethoprim], Voltaren [diclofenac], Warfarin and related, Tobrex [tobramycin], Trazodone  and nefazodone, Zolpidem  tartrate, and Penicillins   Social History   Socioeconomic History   Marital  status: Married    Spouse name: Not on file   Number of children: 2   Years of education: Not on file   Highest education level: Not on file  Occupational History   Not on file  Tobacco Use   Smoking status: Never   Smokeless tobacco: Never  Vaping Use   Vaping status: Never Used  Substance and Sexual Activity   Alcohol use: No   Drug use: No   Sexual activity: Yes  Other Topics Concern   Not on file  Social History Narrative   Not on file   Social Drivers of Health   Financial Resource Strain: Not on file  Food Insecurity: No Food Insecurity (08/26/2023)   Hunger Vital Sign    Worried About Running Out of Food in the Last Year: Never true    Ran Out of Food in the Last Year: Never true  Transportation Needs: No Transportation Needs (08/26/2023)   PRAPARE - Administrator, Civil Service (Medical): No    Lack of Transportation (Non-Medical): No  Physical Activity: Not on file  Stress: Not on file  Social Connections: Not on file     Family History: The patient's family history includes Heart disease in her father and mother.  ROS:   Please see the history of present illness.     All other systems reviewed and are negative.  EKGs/Labs/Other Studies Reviewed:    The following studies were reviewed today:   EKG:   10/02/23: Atrial fibrillation, rate 94,  LVH  Recent Labs: 10/08/2022: Magnesium 2.4 08/26/2023: B Natriuretic Peptide 581.0; TSH 2.352 09/02/2023: ALT 99; BUN 54; Creatinine, Ser 1.53; Hemoglobin 9.3; Platelets 151; Potassium 3.4; Sodium 135  Recent Lipid Panel    Component Value Date/Time   CHOL 117 06/29/2019 0953   CHOL 172 05/01/2015 1234   TRIG 153 (H) 06/29/2019 0953   TRIG 248 (H) 05/01/2015 1234   HDL 51 06/29/2019 0953   HDL 46 05/01/2015 1234   CHOLHDL 2.3 06/29/2019 0953   LDLCALC 40 06/29/2019 0953   LDLCALC 76 05/01/2015 1234    Physical Exam:    VS:  BP 106/62 (BP Location: Left Arm, Patient Position: Sitting, Cuff Size: Normal)   Pulse 94   Ht 5' (1.524 m)   Wt 151 lb (68.5 kg)   SpO2 93%   BMI 29.49 kg/m     Wt Readings from Last 3 Encounters:  10/02/23 151 lb (68.5 kg)  09/02/23 154 lb 12.2 oz (70.2 kg)  10/31/22 151 lb (68.5 kg)     GEN:  in no acute distress HEENT: Normal NECK: No JVD CARDIAC: Irregular, normal rate, mechanical click no murmurs RESPIRATORY:  Clear to auscultation without rales, wheezing or rhonchi  ABDOMEN: Soft, non-tender, non-distended MUSCULOSKELETAL: Asymmetric right greater than left lower extremity edema SKIN: Warm and dry NEUROLOGIC: Oriented to person only PSYCHIATRIC:  Normal affect   ASSESSMENT:    1. Coronary artery disease involving native coronary artery of native heart without angina pectoris   2. Atrial fibrillation, unspecified type (HCC)   3. H/O mechanical aortic valve replacement   4. Chronic anticoagulation   5. Chronic diastolic CHF (congestive heart failure) (HCC)   6. Primary hypertension    PLAN:    CAD:  She underwent CABG with mechanical AVR in [place] in the 90s.  She was evaluated in 03/2015 for anginal symptoms.  Cath showed severe native vessel disease, patent LIMA-LAD and SVG-diagonal, occluded SVG-OM/PDA (second half of OM/PDA graft  was open), 75% ostial RCA stenosis treated with DES. -Continue Coumadin , statin -Continue Imdur  60  mg daily  Atrial fibrillation: Status post cardioversion 07/2019.  Currently in rate controlled A-fib -She is on amiodarone  but suspect may now be permanent A-fib, in which case will likely stop and start low dose metoprolol .  Will check Zio patch x 7 days  -Continue Coumadin .  This has been managed by her facility but her husband request switching to our Coumadin  clinic.  Discussed with pharmacy, she was seen today by Coumadin  clinic and they will resume management of her Coumadin   Status post mechanical AVR: On Coumadin .  Has been complicated by GI bleeding, most recently 08/2023.  Echocardiogram 08/27/2023 showed EF 55 to 60%, low normal RV function, mild left atrial enlargement, moderate right atrial enlargement, severe tricuspid regurgitation, mechanical aortic valve with mean gradient 8 mmHg.  Chronic diastolic heart failure: Currently on torsemide 20 mg twice daily.  Appears euvolemic.  Check BMET, magnesium  Hypertension: On Imdur .  Appears controlled  Hyperlipidemia: On atorvastatin  40 mg daily and Zetia  10 mg daily  CKD stage IIIb: Most recent creatinine 1.53 on 09/02/2023.  Check BMET  RTC in 3 months   Medication Adjustments/Labs and Tests Ordered: Current medicines are reviewed at length with the patient today.  Concerns regarding medicines are outlined above.  Orders Placed This Encounter  Procedures   Comprehensive Metabolic Panel (CMET)   Magnesium   CBC w/Diff/Platelet   LONG TERM MONITOR (3-14 DAYS)   EKG 12-Lead   No orders of the defined types were placed in this encounter.   Patient Instructions  Medication Instructions:  Continue current medications *If you need a refill on your cardiac medications before your next appointment, please call your pharmacy*   Lab Work: Cmet, cbc, mg today If you have labs (blood work) drawn today and your tests are completely normal, you will receive your results only by: MyChart Message (if you have MyChart) OR A paper  copy in the mail If you have any lab test that is abnormal or we need to change your treatment, we will call you to review the results.   Testing/Procedures: Zio  ZIO XT- Long Term Monitor Instructions  Your physician has requested you wear a ZIO patch monitor for 7 days.  This is a single patch monitor. [employer] supplies one patch monitor per enrollment. Additional stickers are not available. Please do not apply patch if you will be having a Nuclear Stress Test,  Echocardiogram, Cardiac CT, MRI, or Chest Xray during the period you would be wearing the  monitor. The patch cannot be worn during these tests. You cannot remove and re-apply the  ZIO XT patch monitor.  Your ZIO patch monitor will be mailed 3 day USPS to your address on file. It may take 3-5 days  to receive your monitor after you have been enrolled.  Once you have received your monitor, please review the enclosed instructions. Your monitor  has already been registered assigning a specific monitor serial # to you.  Billing and Patient Assistance Program Information  We have supplied [employer] with any of your insurance information on file for billing purposes. [employer] offers a sliding scale Patient Assistance Program for patients that do not have  insurance, or whose insurance does not completely cover the cost of the ZIO monitor.  You must apply for the Patient Assistance Program to qualify for this discounted rate.  To apply, please call [employer] at [phone], select option 4, select option  2, ask to apply for  Patient Assistance Program. Meredeth will ask your household income, and how many people  are in your household. They will quote your out-of-pocket cost based on that information.  [employer] will also be able to set up a 35-month, interest-free payment plan if needed.  Applying the monitor   Shave hair from upper left chest.  Hold abrader disc by orange tab. Rub abrader in 40 strokes over the upper left chest as   indicated in your monitor instructions.  Clean area with 4 enclosed alcohol pads. Let dry.  Apply patch as indicated in monitor instructions. Patch will be placed under collarbone on left  side of chest with arrow pointing upward.  Rub patch adhesive wings for 2 minutes. Remove white label marked 1. Remove the white  label marked 2. Rub patch adhesive wings for 2 additional minutes.  While looking in a mirror, press and release button in center of patch. A small green light will  flash 3-4 times. This will be your only indicator that the monitor has been turned on.  Do not shower for the first 24 hours. You may shower after the first 24 hours.  Press the button if you feel a symptom. You will hear a small click. Record Date, Time and  Symptom in the Patient Logbook.  When you are ready to remove the patch, follow instructions on the last 2 pages of Patient  Logbook. Stick patch monitor onto the last page of Patient Logbook.  Place Patient Logbook in the blue and white box. Use locking tab on box and tape box closed  securely. The blue and white box has prepaid postage on it. Please place it in the mailbox as  soon as possible. Your physician should have your test results approximately 7 days after the  monitor has been mailed back to [facility].  Call [facility] Customer Care at [phone] if you have questions regarding  your ZIO XT patch monitor. Call them immediately if you see an orange light blinking on your  monitor.  If your monitor falls off in less than 4 days, contact our Monitor department at [phone].  If your monitor becomes loose or falls off after 4 days call [employer] at [phone] for  suggestions on securing your monitor    Follow-Up: At [facility], you and your health needs are our priority.  As part of our continuing mission to provide you with exceptional heart care, we have created designated Provider Care Teams.  These Care Teams  include your primary Cardiologist (physician) and Advanced Practice Providers (APPs -  Physician Assistants and Nurse Practitioners) who all work together to provide you with the care you need, when you need it.  We recommend signing up for the patient portal called MyChart.  Sign up information is provided on this After Visit Summary.  MyChart is used to connect with patients for Virtual Visits (Telemedicine).  Patients are able to view lab/test results, encounter notes, upcoming appointments, etc.  Non-urgent messages can be sent to your provider as well.   To learn more about what you can do with MyChart, go to [email or web address].    Your next appointment:   3 month(s)  Provider:   Dr. Kate  Other Instructions none         Signed, Lonni LITTIE Kate, MD  10/02/2023 11:25 AM    [facility]

## 2023-10-02 NOTE — Progress Notes (Signed)
 In room INR check during appt with Dr Kate. Patient currently at [facility]. INR has been subtherapeutic per SNF records. Patient currently on warfarin 0.5mg  daily (1/2 of 1mg  tablet). Goal 2.0-2.5. Patient had GI bleed when INR closer to 3.0.   Handwrote Rx for patient to have INR drawn weekly and have results faxed to [phone]. Dr Kate signed. Order faxed to [facility].   [facility] Phone [phone]. Fax: [phone]

## 2023-10-02 NOTE — Patient Instructions (Addendum)
 Description   Patient at [facility]: Phone [phone]. Fax: [phone].  Was on 0.5mg  daily. Take 1mg  today and tomorrow (10/02/23 and 10/03/23) then continue 0.5mg  daily. Recheck INR in 1 week.  Call our office if you have any problems or concerns [phone]

## 2023-10-02 NOTE — Progress Notes (Unsigned)
 Enrolled patient for a 7 day Zio XT monitor to be mailed to patients home.

## 2023-10-02 NOTE — Patient Instructions (Signed)
 Medication Instructions:  Continue current medications *If you need a refill on your cardiac medications before your next appointment, please call your pharmacy*   Lab Work: Cmet, cbc, mg today If you have labs (blood work) drawn today and your tests are completely normal, you will receive your results only by: MyChart Message (if you have MyChart) OR A paper copy in the mail If you have any lab test that is abnormal or we need to change your treatment, we will call you to review the results.   Testing/Procedures: Zio  ZIO XT- Long Term Monitor Instructions  Your physician has requested you wear a ZIO patch monitor for 7 days.  This is a single patch monitor. [employer] supplies one patch monitor per enrollment. Additional stickers are not available. Please do not apply patch if you will be having a Nuclear Stress Test,  Echocardiogram, Cardiac CT, MRI, or Chest Xray during the period you would be wearing the  monitor. The patch cannot be worn during these tests. You cannot remove and re-apply the  ZIO XT patch monitor.  Your ZIO patch monitor will be mailed 3 day USPS to your address on file. It may take 3-5 days  to receive your monitor after you have been enrolled.  Once you have received your monitor, please review the enclosed instructions. Your monitor  has already been registered assigning a specific monitor serial # to you.  Billing and Patient Assistance Program Information  We have supplied [employer] with any of your insurance information on file for billing purposes. [employer] offers a sliding scale Patient Assistance Program for patients that do not have  insurance, or whose insurance does not completely cover the cost of the ZIO monitor.  You must apply for the Patient Assistance Program to qualify for this discounted rate.  To apply, please call [employer] at [phone], select option 4, select option 2, ask to apply for  Patient Assistance Program. Meredeth will ask your  household income, and how many people  are in your household. They will quote your out-of-pocket cost based on that information.  [employer] will also be able to set up a 34-month, interest-free payment plan if needed.  Applying the monitor   Shave hair from upper left chest.  Hold abrader disc by orange tab. Rub abrader in 40 strokes over the upper left chest as  indicated in your monitor instructions.  Clean area with 4 enclosed alcohol pads. Let dry.  Apply patch as indicated in monitor instructions. Patch will be placed under collarbone on left  side of chest with arrow pointing upward.  Rub patch adhesive wings for 2 minutes. Remove white label marked 1. Remove the white  label marked 2. Rub patch adhesive wings for 2 additional minutes.  While looking in a mirror, press and release button in center of patch. A small green light will  flash 3-4 times. This will be your only indicator that the monitor has been turned on.  Do not shower for the first 24 hours. You may shower after the first 24 hours.  Press the button if you feel a symptom. You will hear a small click. Record Date, Time and  Symptom in the Patient Logbook.  When you are ready to remove the patch, follow instructions on the last 2 pages of Patient  Logbook. Stick patch monitor onto the last page of Patient Logbook.  Place Patient Logbook in the blue and white box. Use locking tab on box and tape box closed  securely.  The blue and white box has prepaid postage on it. Please place it in the mailbox as  soon as possible. Your physician should have your test results approximately 7 days after the  monitor has been mailed back to [facility].  Call [facility] Customer Care at [phone] if you have questions regarding  your ZIO XT patch monitor. Call them immediately if you see an orange light blinking on your  monitor.  If your monitor falls off in less than 4 days, contact our Monitor department at [phone].   If your monitor becomes loose or falls off after 4 days call [employer] at [phone] for  suggestions on securing your monitor    Follow-Up: At [facility], you and your health needs are our priority.  As part of our continuing mission to provide you with exceptional heart care, we have created designated Provider Care Teams.  These Care Teams include your primary Cardiologist (physician) and Advanced Practice Providers (APPs -  Physician Assistants and Nurse Practitioners) who all work together to provide you with the care you need, when you need it.  We recommend signing up for the patient portal called MyChart.  Sign up information is provided on this After Visit Summary.  MyChart is used to connect with patients for Virtual Visits (Telemedicine).  Patients are able to view lab/test results, encounter notes, upcoming appointments, etc.  Non-urgent messages can be sent to your provider as well.   To learn more about what you can do with MyChart, go to [email or web address].    Your next appointment:   3 month(s)  Provider:   Dr. Kate  Other Instructions none

## 2023-10-03 LAB — COMPREHENSIVE METABOLIC PANEL
ALT: 100 [IU]/L — ABNORMAL HIGH (ref 0–32)
AST: 134 [IU]/L — ABNORMAL HIGH (ref 0–40)
Albumin: 3.6 g/dL — ABNORMAL LOW (ref 3.7–4.7)
Alkaline Phosphatase: 118 [IU]/L (ref 44–121)
BUN/Creatinine Ratio: 17 (ref 12–28)
BUN: 30 mg/dL — ABNORMAL HIGH (ref 8–27)
Bilirubin Total: 0.9 mg/dL (ref 0.0–1.2)
CO2: 26 mmol/L (ref 20–29)
Calcium: 9.3 mg/dL (ref 8.7–10.3)
Chloride: 102 mmol/L (ref 96–106)
Creatinine, Ser: 1.73 mg/dL — ABNORMAL HIGH (ref 0.57–1.00)
Globulin, Total: 2.8 g/dL (ref 1.5–4.5)
Glucose: 128 mg/dL — ABNORMAL HIGH (ref 70–99)
Potassium: 4.2 mmol/L (ref 3.5–5.2)
Sodium: 146 mmol/L — ABNORMAL HIGH (ref 134–144)
Total Protein: 6.4 g/dL (ref 6.0–8.5)
eGFR: 28 mL/min/{1.73_m2} — ABNORMAL LOW (ref >59)

## 2023-10-03 LAB — MAGNESIUM: Magnesium: 2.1 mg/dL (ref 1.6–2.3)

## 2023-10-05 ENCOUNTER — Ambulatory Visit (INDEPENDENT_AMBULATORY_CARE_PROVIDER_SITE_OTHER): Payer: Self-pay | Admitting: Pharmacist

## 2023-10-05 DIAGNOSIS — Z952 Presence of prosthetic heart valve: Secondary | ICD-10-CM

## 2023-10-05 DIAGNOSIS — Z7901 Long term (current) use of anticoagulants: Secondary | ICD-10-CM | POA: Diagnosis not present

## 2023-10-05 DIAGNOSIS — Z5181 Encounter for therapeutic drug level monitoring: Secondary | ICD-10-CM | POA: Diagnosis not present

## 2023-10-05 LAB — POCT INR: INR: 1.3 — AB (ref 2.0–3.0)

## 2023-10-05 NOTE — Patient Instructions (Signed)
 Description   Spoke with Keene at [facility]. Increase dose to 1mg  Mondays and Fridays and 0.5mg  all other days of the week.  Patient at [facility]: Phone [phone]. Fax: [phone].  Was on 0.5mg  daily. Take 1mg  today and tomorrow (10/02/23 and 10/03/23) then continue 0.5mg  daily. Recheck INR in 1 week.  Call our office if you have any problems or concerns [phone]

## 2023-10-07 DIAGNOSIS — I4891 Unspecified atrial fibrillation: Secondary | ICD-10-CM | POA: Diagnosis not present

## 2023-10-08 ENCOUNTER — Telehealth: Payer: Self-pay | Admitting: *Deleted

## 2023-10-08 DIAGNOSIS — R899 Unspecified abnormal finding in specimens from other organs, systems and tissues: Secondary | ICD-10-CM

## 2023-10-08 DIAGNOSIS — I10 Essential (primary) hypertension: Secondary | ICD-10-CM

## 2023-10-08 MED ORDER — METOPROLOL TARTRATE 25 MG PO TABS: 12.5000 mg | ORAL_TABLET | Freq: Two times a day (BID) | ORAL | 3 refills | Status: AC

## 2023-10-08 NOTE — Telephone Encounter (Signed)
Called left detailed message on a secure answering machine . Also release information to mychart  Informed patient via message to stop taking Amiodarone and start taking Metoprolol tartrate 12.5 mg twice a day . Please recheck labs one month CMP . Labslip mailed to patient

## 2023-10-08 NOTE — Telephone Encounter (Signed)
-----   Message from Little Ishikawa sent at 10/05/2023  6:15 AM EST ----- Kidney function remains reduced but stable.  Elevated liver enzymes.  Question whether this may be related to amiodarone.  As I suspect she is in permanent atrial fibrillation, I do not think the amiodarone is needed.  Would suggest stopping amiodarone and starting metoprolol 12.5 mg twice daily.  Would repeat CMET in 1 month

## 2023-10-09 DIAGNOSIS — H35371 Puckering of macula, right eye: Secondary | ICD-10-CM | POA: Diagnosis not present

## 2023-10-09 DIAGNOSIS — H43813 Vitreous degeneration, bilateral: Secondary | ICD-10-CM | POA: Diagnosis not present

## 2023-10-09 DIAGNOSIS — E113293 Type 2 diabetes mellitus with mild nonproliferative diabetic retinopathy without macular edema, bilateral: Secondary | ICD-10-CM | POA: Diagnosis not present

## 2023-10-09 DIAGNOSIS — H353112 Nonexudative age-related macular degeneration, right eye, intermediate dry stage: Secondary | ICD-10-CM | POA: Diagnosis not present

## 2023-10-09 DIAGNOSIS — H353221 Exudative age-related macular degeneration, left eye, with active choroidal neovascularization: Secondary | ICD-10-CM | POA: Diagnosis not present

## 2023-10-12 ENCOUNTER — Ambulatory Visit (INDEPENDENT_AMBULATORY_CARE_PROVIDER_SITE_OTHER): Payer: Self-pay | Admitting: Cardiovascular Disease

## 2023-10-12 DIAGNOSIS — Z5181 Encounter for therapeutic drug level monitoring: Secondary | ICD-10-CM

## 2023-10-12 DIAGNOSIS — Z7901 Long term (current) use of anticoagulants: Secondary | ICD-10-CM | POA: Diagnosis not present

## 2023-10-12 LAB — POCT INR: INR: 1.3 — AB (ref 2.0–3.0)

## 2023-10-13 ENCOUNTER — Telehealth: Payer: Self-pay | Admitting: *Deleted

## 2023-10-15 DIAGNOSIS — M6281 Muscle weakness (generalized): Secondary | ICD-10-CM | POA: Diagnosis not present

## 2023-10-15 DIAGNOSIS — R2681 Unsteadiness on feet: Secondary | ICD-10-CM | POA: Diagnosis not present

## 2023-10-15 DIAGNOSIS — Z952 Presence of prosthetic heart valve: Secondary | ICD-10-CM | POA: Diagnosis not present

## 2023-10-15 DIAGNOSIS — I480 Paroxysmal atrial fibrillation: Secondary | ICD-10-CM | POA: Diagnosis not present

## 2023-10-15 DIAGNOSIS — J810 Acute pulmonary edema: Secondary | ICD-10-CM | POA: Diagnosis not present

## 2023-10-15 DIAGNOSIS — D6869 Other thrombophilia: Secondary | ICD-10-CM | POA: Diagnosis not present

## 2023-10-15 DIAGNOSIS — Q2733 Arteriovenous malformation of digestive system vessel: Secondary | ICD-10-CM | POA: Diagnosis not present

## 2023-10-15 DIAGNOSIS — I5030 Unspecified diastolic (congestive) heart failure: Secondary | ICD-10-CM | POA: Diagnosis not present

## 2023-10-15 DIAGNOSIS — R278 Other lack of coordination: Secondary | ICD-10-CM | POA: Diagnosis not present

## 2023-10-16 DIAGNOSIS — J810 Acute pulmonary edema: Secondary | ICD-10-CM | POA: Diagnosis not present

## 2023-10-16 DIAGNOSIS — R278 Other lack of coordination: Secondary | ICD-10-CM | POA: Diagnosis not present

## 2023-10-16 DIAGNOSIS — M6281 Muscle weakness (generalized): Secondary | ICD-10-CM | POA: Diagnosis not present

## 2023-10-16 DIAGNOSIS — R2681 Unsteadiness on feet: Secondary | ICD-10-CM | POA: Diagnosis not present

## 2023-10-16 DIAGNOSIS — I480 Paroxysmal atrial fibrillation: Secondary | ICD-10-CM | POA: Diagnosis not present

## 2023-10-16 DIAGNOSIS — Q2733 Arteriovenous malformation of digestive system vessel: Secondary | ICD-10-CM | POA: Diagnosis not present

## 2023-10-19 ENCOUNTER — Ambulatory Visit (INDEPENDENT_AMBULATORY_CARE_PROVIDER_SITE_OTHER): Payer: Self-pay

## 2023-10-19 DIAGNOSIS — M6281 Muscle weakness (generalized): Secondary | ICD-10-CM | POA: Diagnosis not present

## 2023-10-19 DIAGNOSIS — I480 Paroxysmal atrial fibrillation: Secondary | ICD-10-CM | POA: Diagnosis not present

## 2023-10-19 DIAGNOSIS — Q2733 Arteriovenous malformation of digestive system vessel: Secondary | ICD-10-CM | POA: Diagnosis not present

## 2023-10-19 DIAGNOSIS — R278 Other lack of coordination: Secondary | ICD-10-CM | POA: Diagnosis not present

## 2023-10-19 DIAGNOSIS — R2681 Unsteadiness on feet: Secondary | ICD-10-CM | POA: Diagnosis not present

## 2023-10-19 DIAGNOSIS — Z79899 Other long term (current) drug therapy: Secondary | ICD-10-CM | POA: Diagnosis not present

## 2023-10-19 DIAGNOSIS — Z5181 Encounter for therapeutic drug level monitoring: Secondary | ICD-10-CM | POA: Diagnosis not present

## 2023-10-19 DIAGNOSIS — J810 Acute pulmonary edema: Secondary | ICD-10-CM | POA: Diagnosis not present

## 2023-10-19 DIAGNOSIS — Z7901 Long term (current) use of anticoagulants: Secondary | ICD-10-CM | POA: Diagnosis not present

## 2023-10-19 LAB — PROTIME-INR: INR: 1.34 — AB (ref 0.80–1.20)

## 2023-10-19 NOTE — Patient Instructions (Signed)
Description   Spoke with [name] at [facility]. Take 2 tablets today then START taking to 1 tablet daily, except 1/2 tablet on Sundays.  Recheck INR in 1 week Patient at [facility]: Phone [phone]. Fax: [phone]. Call our office if you have any problems or concerns [phone]

## 2023-10-20 DIAGNOSIS — M6281 Muscle weakness (generalized): Secondary | ICD-10-CM | POA: Diagnosis not present

## 2023-10-20 DIAGNOSIS — J810 Acute pulmonary edema: Secondary | ICD-10-CM | POA: Diagnosis not present

## 2023-10-20 DIAGNOSIS — R2681 Unsteadiness on feet: Secondary | ICD-10-CM | POA: Diagnosis not present

## 2023-10-20 DIAGNOSIS — R278 Other lack of coordination: Secondary | ICD-10-CM | POA: Diagnosis not present

## 2023-10-20 DIAGNOSIS — Q2733 Arteriovenous malformation of digestive system vessel: Secondary | ICD-10-CM | POA: Diagnosis not present

## 2023-10-20 DIAGNOSIS — I480 Paroxysmal atrial fibrillation: Secondary | ICD-10-CM | POA: Diagnosis not present

## 2023-10-21 DIAGNOSIS — I480 Paroxysmal atrial fibrillation: Secondary | ICD-10-CM | POA: Diagnosis not present

## 2023-10-21 DIAGNOSIS — M6281 Muscle weakness (generalized): Secondary | ICD-10-CM | POA: Diagnosis not present

## 2023-10-21 DIAGNOSIS — R2681 Unsteadiness on feet: Secondary | ICD-10-CM | POA: Diagnosis not present

## 2023-10-21 DIAGNOSIS — J810 Acute pulmonary edema: Secondary | ICD-10-CM | POA: Diagnosis not present

## 2023-10-21 DIAGNOSIS — R278 Other lack of coordination: Secondary | ICD-10-CM | POA: Diagnosis not present

## 2023-10-21 DIAGNOSIS — Q2733 Arteriovenous malformation of digestive system vessel: Secondary | ICD-10-CM | POA: Diagnosis not present

## 2023-10-22 DIAGNOSIS — R2681 Unsteadiness on feet: Secondary | ICD-10-CM | POA: Diagnosis not present

## 2023-10-22 DIAGNOSIS — Q2733 Arteriovenous malformation of digestive system vessel: Secondary | ICD-10-CM | POA: Diagnosis not present

## 2023-10-22 DIAGNOSIS — R278 Other lack of coordination: Secondary | ICD-10-CM | POA: Diagnosis not present

## 2023-10-22 DIAGNOSIS — M6281 Muscle weakness (generalized): Secondary | ICD-10-CM | POA: Diagnosis not present

## 2023-10-22 DIAGNOSIS — I480 Paroxysmal atrial fibrillation: Secondary | ICD-10-CM | POA: Diagnosis not present

## 2023-10-22 DIAGNOSIS — J810 Acute pulmonary edema: Secondary | ICD-10-CM | POA: Diagnosis not present

## 2023-10-23 DIAGNOSIS — Q2733 Arteriovenous malformation of digestive system vessel: Secondary | ICD-10-CM | POA: Diagnosis not present

## 2023-10-23 DIAGNOSIS — J810 Acute pulmonary edema: Secondary | ICD-10-CM | POA: Diagnosis not present

## 2023-10-23 DIAGNOSIS — R2681 Unsteadiness on feet: Secondary | ICD-10-CM | POA: Diagnosis not present

## 2023-10-23 DIAGNOSIS — M6281 Muscle weakness (generalized): Secondary | ICD-10-CM | POA: Diagnosis not present

## 2023-10-23 DIAGNOSIS — I4891 Unspecified atrial fibrillation: Secondary | ICD-10-CM | POA: Diagnosis not present

## 2023-10-23 DIAGNOSIS — R278 Other lack of coordination: Secondary | ICD-10-CM | POA: Diagnosis not present

## 2023-10-23 DIAGNOSIS — I480 Paroxysmal atrial fibrillation: Secondary | ICD-10-CM | POA: Diagnosis not present

## 2023-10-26 DIAGNOSIS — M6281 Muscle weakness (generalized): Secondary | ICD-10-CM | POA: Diagnosis not present

## 2023-10-26 DIAGNOSIS — Q2733 Arteriovenous malformation of digestive system vessel: Secondary | ICD-10-CM | POA: Diagnosis not present

## 2023-10-26 DIAGNOSIS — Z7901 Long term (current) use of anticoagulants: Secondary | ICD-10-CM | POA: Diagnosis not present

## 2023-10-26 DIAGNOSIS — J810 Acute pulmonary edema: Secondary | ICD-10-CM | POA: Diagnosis not present

## 2023-10-26 DIAGNOSIS — I480 Paroxysmal atrial fibrillation: Secondary | ICD-10-CM | POA: Diagnosis not present

## 2023-10-26 DIAGNOSIS — R278 Other lack of coordination: Secondary | ICD-10-CM | POA: Diagnosis not present

## 2023-10-26 DIAGNOSIS — Z79899 Other long term (current) drug therapy: Secondary | ICD-10-CM | POA: Diagnosis not present

## 2023-10-26 DIAGNOSIS — R2681 Unsteadiness on feet: Secondary | ICD-10-CM | POA: Diagnosis not present

## 2023-10-26 LAB — PROTIME-INR: INR: 1.44 — AB (ref 0.80–1.20)

## 2023-10-27 ENCOUNTER — Ambulatory Visit (INDEPENDENT_AMBULATORY_CARE_PROVIDER_SITE_OTHER): Payer: Self-pay | Admitting: Cardiology

## 2023-10-27 DIAGNOSIS — J810 Acute pulmonary edema: Secondary | ICD-10-CM | POA: Diagnosis not present

## 2023-10-27 DIAGNOSIS — R278 Other lack of coordination: Secondary | ICD-10-CM | POA: Diagnosis not present

## 2023-10-27 DIAGNOSIS — M6281 Muscle weakness (generalized): Secondary | ICD-10-CM | POA: Diagnosis not present

## 2023-10-27 DIAGNOSIS — Q2733 Arteriovenous malformation of digestive system vessel: Secondary | ICD-10-CM | POA: Diagnosis not present

## 2023-10-27 DIAGNOSIS — I480 Paroxysmal atrial fibrillation: Secondary | ICD-10-CM | POA: Diagnosis not present

## 2023-10-27 DIAGNOSIS — R2681 Unsteadiness on feet: Secondary | ICD-10-CM | POA: Diagnosis not present

## 2023-10-27 DIAGNOSIS — Z7901 Long term (current) use of anticoagulants: Secondary | ICD-10-CM

## 2023-10-27 DIAGNOSIS — Z5181 Encounter for therapeutic drug level monitoring: Secondary | ICD-10-CM | POA: Diagnosis not present

## 2023-10-28 DIAGNOSIS — R2681 Unsteadiness on feet: Secondary | ICD-10-CM | POA: Diagnosis not present

## 2023-10-28 DIAGNOSIS — R278 Other lack of coordination: Secondary | ICD-10-CM | POA: Diagnosis not present

## 2023-10-28 DIAGNOSIS — Q2733 Arteriovenous malformation of digestive system vessel: Secondary | ICD-10-CM | POA: Diagnosis not present

## 2023-10-28 DIAGNOSIS — M6281 Muscle weakness (generalized): Secondary | ICD-10-CM | POA: Diagnosis not present

## 2023-10-28 DIAGNOSIS — J810 Acute pulmonary edema: Secondary | ICD-10-CM | POA: Diagnosis not present

## 2023-10-28 DIAGNOSIS — I480 Paroxysmal atrial fibrillation: Secondary | ICD-10-CM | POA: Diagnosis not present

## 2023-10-29 DIAGNOSIS — I480 Paroxysmal atrial fibrillation: Secondary | ICD-10-CM | POA: Diagnosis not present

## 2023-10-29 DIAGNOSIS — R2681 Unsteadiness on feet: Secondary | ICD-10-CM | POA: Diagnosis not present

## 2023-10-29 DIAGNOSIS — R278 Other lack of coordination: Secondary | ICD-10-CM | POA: Diagnosis not present

## 2023-10-29 DIAGNOSIS — M6281 Muscle weakness (generalized): Secondary | ICD-10-CM | POA: Diagnosis not present

## 2023-10-29 DIAGNOSIS — J810 Acute pulmonary edema: Secondary | ICD-10-CM | POA: Diagnosis not present

## 2023-10-29 DIAGNOSIS — Q2733 Arteriovenous malformation of digestive system vessel: Secondary | ICD-10-CM | POA: Diagnosis not present

## 2023-10-30 DIAGNOSIS — M6281 Muscle weakness (generalized): Secondary | ICD-10-CM | POA: Diagnosis not present

## 2023-10-30 DIAGNOSIS — R278 Other lack of coordination: Secondary | ICD-10-CM | POA: Diagnosis not present

## 2023-10-30 DIAGNOSIS — I480 Paroxysmal atrial fibrillation: Secondary | ICD-10-CM | POA: Diagnosis not present

## 2023-10-30 DIAGNOSIS — J810 Acute pulmonary edema: Secondary | ICD-10-CM | POA: Diagnosis not present

## 2023-10-30 DIAGNOSIS — R2681 Unsteadiness on feet: Secondary | ICD-10-CM | POA: Diagnosis not present

## 2023-10-30 DIAGNOSIS — Q2733 Arteriovenous malformation of digestive system vessel: Secondary | ICD-10-CM | POA: Diagnosis not present

## 2023-11-01 DIAGNOSIS — J810 Acute pulmonary edema: Secondary | ICD-10-CM | POA: Diagnosis not present

## 2023-11-01 DIAGNOSIS — M6281 Muscle weakness (generalized): Secondary | ICD-10-CM | POA: Diagnosis not present

## 2023-11-01 DIAGNOSIS — Q2733 Arteriovenous malformation of digestive system vessel: Secondary | ICD-10-CM | POA: Diagnosis not present

## 2023-11-01 DIAGNOSIS — R278 Other lack of coordination: Secondary | ICD-10-CM | POA: Diagnosis not present

## 2023-11-01 DIAGNOSIS — R2681 Unsteadiness on feet: Secondary | ICD-10-CM | POA: Diagnosis not present

## 2023-11-01 DIAGNOSIS — I480 Paroxysmal atrial fibrillation: Secondary | ICD-10-CM | POA: Diagnosis not present

## 2023-11-02 ENCOUNTER — Ambulatory Visit (INDEPENDENT_AMBULATORY_CARE_PROVIDER_SITE_OTHER): Payer: Self-pay | Admitting: Cardiology

## 2023-11-02 DIAGNOSIS — F03A4 Unspecified dementia, mild, with anxiety: Secondary | ICD-10-CM | POA: Diagnosis not present

## 2023-11-02 DIAGNOSIS — Z7901 Long term (current) use of anticoagulants: Secondary | ICD-10-CM | POA: Diagnosis not present

## 2023-11-02 DIAGNOSIS — J810 Acute pulmonary edema: Secondary | ICD-10-CM | POA: Diagnosis not present

## 2023-11-02 DIAGNOSIS — Z5181 Encounter for therapeutic drug level monitoring: Secondary | ICD-10-CM | POA: Diagnosis not present

## 2023-11-02 DIAGNOSIS — F5101 Primary insomnia: Secondary | ICD-10-CM | POA: Diagnosis not present

## 2023-11-02 DIAGNOSIS — Q2733 Arteriovenous malformation of digestive system vessel: Secondary | ICD-10-CM | POA: Diagnosis not present

## 2023-11-02 DIAGNOSIS — Z79899 Other long term (current) drug therapy: Secondary | ICD-10-CM | POA: Diagnosis not present

## 2023-11-02 DIAGNOSIS — M6281 Muscle weakness (generalized): Secondary | ICD-10-CM | POA: Diagnosis not present

## 2023-11-02 DIAGNOSIS — I480 Paroxysmal atrial fibrillation: Secondary | ICD-10-CM | POA: Diagnosis not present

## 2023-11-02 DIAGNOSIS — I482 Chronic atrial fibrillation, unspecified: Secondary | ICD-10-CM | POA: Diagnosis not present

## 2023-11-02 DIAGNOSIS — R278 Other lack of coordination: Secondary | ICD-10-CM | POA: Diagnosis not present

## 2023-11-02 DIAGNOSIS — R2681 Unsteadiness on feet: Secondary | ICD-10-CM | POA: Diagnosis not present

## 2023-11-02 DIAGNOSIS — F411 Generalized anxiety disorder: Secondary | ICD-10-CM | POA: Diagnosis not present

## 2023-11-02 LAB — POCT INR: INR: 1.4 — AB (ref 2.0–3.0)

## 2023-11-03 DIAGNOSIS — I480 Paroxysmal atrial fibrillation: Secondary | ICD-10-CM | POA: Diagnosis not present

## 2023-11-03 DIAGNOSIS — R278 Other lack of coordination: Secondary | ICD-10-CM | POA: Diagnosis not present

## 2023-11-03 DIAGNOSIS — M6281 Muscle weakness (generalized): Secondary | ICD-10-CM | POA: Diagnosis not present

## 2023-11-03 DIAGNOSIS — R2681 Unsteadiness on feet: Secondary | ICD-10-CM | POA: Diagnosis not present

## 2023-11-03 DIAGNOSIS — J810 Acute pulmonary edema: Secondary | ICD-10-CM | POA: Diagnosis not present

## 2023-11-03 DIAGNOSIS — Q2733 Arteriovenous malformation of digestive system vessel: Secondary | ICD-10-CM | POA: Diagnosis not present

## 2023-11-04 DIAGNOSIS — M6281 Muscle weakness (generalized): Secondary | ICD-10-CM | POA: Diagnosis not present

## 2023-11-04 DIAGNOSIS — J810 Acute pulmonary edema: Secondary | ICD-10-CM | POA: Diagnosis not present

## 2023-11-04 DIAGNOSIS — Q2733 Arteriovenous malformation of digestive system vessel: Secondary | ICD-10-CM | POA: Diagnosis not present

## 2023-11-04 DIAGNOSIS — R278 Other lack of coordination: Secondary | ICD-10-CM | POA: Diagnosis not present

## 2023-11-04 DIAGNOSIS — I480 Paroxysmal atrial fibrillation: Secondary | ICD-10-CM | POA: Diagnosis not present

## 2023-11-04 DIAGNOSIS — R2681 Unsteadiness on feet: Secondary | ICD-10-CM | POA: Diagnosis not present

## 2023-11-05 ENCOUNTER — Other Ambulatory Visit: Payer: Self-pay | Admitting: *Deleted

## 2023-11-05 DIAGNOSIS — R278 Other lack of coordination: Secondary | ICD-10-CM | POA: Diagnosis not present

## 2023-11-05 DIAGNOSIS — R2681 Unsteadiness on feet: Secondary | ICD-10-CM | POA: Diagnosis not present

## 2023-11-05 DIAGNOSIS — Q2733 Arteriovenous malformation of digestive system vessel: Secondary | ICD-10-CM | POA: Diagnosis not present

## 2023-11-05 DIAGNOSIS — J810 Acute pulmonary edema: Secondary | ICD-10-CM | POA: Diagnosis not present

## 2023-11-05 DIAGNOSIS — M6281 Muscle weakness (generalized): Secondary | ICD-10-CM | POA: Diagnosis not present

## 2023-11-05 DIAGNOSIS — I480 Paroxysmal atrial fibrillation: Secondary | ICD-10-CM | POA: Diagnosis not present

## 2023-11-05 NOTE — Patient Outreach (Signed)
Per [facility], Mrs. Stucker resides in [facility]. Screening for potential complex care management services as benefit of health plan and Primary Care Provider.  [facility] social worker previously indicated Mrs. Lafont is a long term care resident at [facility].   No identifiable complex care management needs.  Raiford Noble, MSN, RN, BSN [place]  [facility] RN Post- Acute Care Manager Direct Dial: [phone]

## 2023-11-06 DIAGNOSIS — Q2733 Arteriovenous malformation of digestive system vessel: Secondary | ICD-10-CM | POA: Diagnosis not present

## 2023-11-06 DIAGNOSIS — R2681 Unsteadiness on feet: Secondary | ICD-10-CM | POA: Diagnosis not present

## 2023-11-06 DIAGNOSIS — M6281 Muscle weakness (generalized): Secondary | ICD-10-CM | POA: Diagnosis not present

## 2023-11-06 DIAGNOSIS — J810 Acute pulmonary edema: Secondary | ICD-10-CM | POA: Diagnosis not present

## 2023-11-06 DIAGNOSIS — I480 Paroxysmal atrial fibrillation: Secondary | ICD-10-CM | POA: Diagnosis not present

## 2023-11-06 DIAGNOSIS — R278 Other lack of coordination: Secondary | ICD-10-CM | POA: Diagnosis not present

## 2023-11-09 ENCOUNTER — Ambulatory Visit (INDEPENDENT_AMBULATORY_CARE_PROVIDER_SITE_OTHER): Payer: Self-pay | Admitting: Cardiovascular Disease

## 2023-11-09 DIAGNOSIS — Q2733 Arteriovenous malformation of digestive system vessel: Secondary | ICD-10-CM | POA: Diagnosis not present

## 2023-11-09 DIAGNOSIS — R278 Other lack of coordination: Secondary | ICD-10-CM | POA: Diagnosis not present

## 2023-11-09 DIAGNOSIS — R2681 Unsteadiness on feet: Secondary | ICD-10-CM | POA: Diagnosis not present

## 2023-11-09 DIAGNOSIS — Z79899 Other long term (current) drug therapy: Secondary | ICD-10-CM | POA: Diagnosis not present

## 2023-11-09 DIAGNOSIS — J810 Acute pulmonary edema: Secondary | ICD-10-CM | POA: Diagnosis not present

## 2023-11-09 DIAGNOSIS — Z5181 Encounter for therapeutic drug level monitoring: Secondary | ICD-10-CM

## 2023-11-09 DIAGNOSIS — M6281 Muscle weakness (generalized): Secondary | ICD-10-CM | POA: Diagnosis not present

## 2023-11-09 DIAGNOSIS — I480 Paroxysmal atrial fibrillation: Secondary | ICD-10-CM | POA: Diagnosis not present

## 2023-11-09 DIAGNOSIS — Z7901 Long term (current) use of anticoagulants: Secondary | ICD-10-CM

## 2023-11-09 LAB — POCT INR: INR: 1.5 — AB (ref 2.0–3.0)

## 2023-11-10 DIAGNOSIS — M6281 Muscle weakness (generalized): Secondary | ICD-10-CM | POA: Diagnosis not present

## 2023-11-10 DIAGNOSIS — J810 Acute pulmonary edema: Secondary | ICD-10-CM | POA: Diagnosis not present

## 2023-11-10 DIAGNOSIS — I480 Paroxysmal atrial fibrillation: Secondary | ICD-10-CM | POA: Diagnosis not present

## 2023-11-10 DIAGNOSIS — Q2733 Arteriovenous malformation of digestive system vessel: Secondary | ICD-10-CM | POA: Diagnosis not present

## 2023-11-10 DIAGNOSIS — R278 Other lack of coordination: Secondary | ICD-10-CM | POA: Diagnosis not present

## 2023-11-10 DIAGNOSIS — R2681 Unsteadiness on feet: Secondary | ICD-10-CM | POA: Diagnosis not present

## 2023-11-11 DIAGNOSIS — I480 Paroxysmal atrial fibrillation: Secondary | ICD-10-CM | POA: Diagnosis not present

## 2023-11-11 DIAGNOSIS — R2681 Unsteadiness on feet: Secondary | ICD-10-CM | POA: Diagnosis not present

## 2023-11-11 DIAGNOSIS — M6281 Muscle weakness (generalized): Secondary | ICD-10-CM | POA: Diagnosis not present

## 2023-11-11 DIAGNOSIS — J810 Acute pulmonary edema: Secondary | ICD-10-CM | POA: Diagnosis not present

## 2023-11-11 DIAGNOSIS — R278 Other lack of coordination: Secondary | ICD-10-CM | POA: Diagnosis not present

## 2023-11-11 DIAGNOSIS — Q2733 Arteriovenous malformation of digestive system vessel: Secondary | ICD-10-CM | POA: Diagnosis not present

## 2023-11-12 DIAGNOSIS — I480 Paroxysmal atrial fibrillation: Secondary | ICD-10-CM | POA: Diagnosis not present

## 2023-11-12 DIAGNOSIS — R2681 Unsteadiness on feet: Secondary | ICD-10-CM | POA: Diagnosis not present

## 2023-11-12 DIAGNOSIS — R278 Other lack of coordination: Secondary | ICD-10-CM | POA: Diagnosis not present

## 2023-11-12 DIAGNOSIS — Q2733 Arteriovenous malformation of digestive system vessel: Secondary | ICD-10-CM | POA: Diagnosis not present

## 2023-11-12 DIAGNOSIS — M6281 Muscle weakness (generalized): Secondary | ICD-10-CM | POA: Diagnosis not present

## 2023-11-12 DIAGNOSIS — J810 Acute pulmonary edema: Secondary | ICD-10-CM | POA: Diagnosis not present

## 2023-11-13 DIAGNOSIS — I480 Paroxysmal atrial fibrillation: Secondary | ICD-10-CM | POA: Diagnosis not present

## 2023-11-13 DIAGNOSIS — R278 Other lack of coordination: Secondary | ICD-10-CM | POA: Diagnosis not present

## 2023-11-13 DIAGNOSIS — R2681 Unsteadiness on feet: Secondary | ICD-10-CM | POA: Diagnosis not present

## 2023-11-13 DIAGNOSIS — J810 Acute pulmonary edema: Secondary | ICD-10-CM | POA: Diagnosis not present

## 2023-11-13 DIAGNOSIS — Q2733 Arteriovenous malformation of digestive system vessel: Secondary | ICD-10-CM | POA: Diagnosis not present

## 2023-11-13 DIAGNOSIS — M6281 Muscle weakness (generalized): Secondary | ICD-10-CM | POA: Diagnosis not present

## 2023-11-15 ENCOUNTER — Encounter: Payer: Self-pay | Admitting: Cardiology

## 2023-11-15 DIAGNOSIS — R748 Abnormal levels of other serum enzymes: Secondary | ICD-10-CM | POA: Diagnosis not present

## 2023-11-16 ENCOUNTER — Ambulatory Visit (INDEPENDENT_AMBULATORY_CARE_PROVIDER_SITE_OTHER): Payer: Self-pay | Admitting: Cardiovascular Disease

## 2023-11-16 ENCOUNTER — Encounter: Payer: Self-pay | Admitting: *Deleted

## 2023-11-16 DIAGNOSIS — Z7901 Long term (current) use of anticoagulants: Secondary | ICD-10-CM | POA: Diagnosis not present

## 2023-11-16 DIAGNOSIS — F411 Generalized anxiety disorder: Secondary | ICD-10-CM | POA: Diagnosis not present

## 2023-11-16 DIAGNOSIS — Z5181 Encounter for therapeutic drug level monitoring: Secondary | ICD-10-CM

## 2023-11-16 DIAGNOSIS — I480 Paroxysmal atrial fibrillation: Secondary | ICD-10-CM | POA: Diagnosis not present

## 2023-11-16 DIAGNOSIS — R2681 Unsteadiness on feet: Secondary | ICD-10-CM | POA: Diagnosis not present

## 2023-11-16 DIAGNOSIS — F03A4 Unspecified dementia, mild, with anxiety: Secondary | ICD-10-CM | POA: Diagnosis not present

## 2023-11-16 DIAGNOSIS — Q2733 Arteriovenous malformation of digestive system vessel: Secondary | ICD-10-CM | POA: Diagnosis not present

## 2023-11-16 DIAGNOSIS — J810 Acute pulmonary edema: Secondary | ICD-10-CM | POA: Diagnosis not present

## 2023-11-16 DIAGNOSIS — R278 Other lack of coordination: Secondary | ICD-10-CM | POA: Diagnosis not present

## 2023-11-16 DIAGNOSIS — F5101 Primary insomnia: Secondary | ICD-10-CM | POA: Diagnosis not present

## 2023-11-16 DIAGNOSIS — M6281 Muscle weakness (generalized): Secondary | ICD-10-CM | POA: Diagnosis not present

## 2023-11-16 LAB — POCT INR: INR: 2.2 (ref 2.0–3.0)

## 2023-11-17 DIAGNOSIS — I480 Paroxysmal atrial fibrillation: Secondary | ICD-10-CM | POA: Diagnosis not present

## 2023-11-17 DIAGNOSIS — M6281 Muscle weakness (generalized): Secondary | ICD-10-CM | POA: Diagnosis not present

## 2023-11-17 DIAGNOSIS — Q2733 Arteriovenous malformation of digestive system vessel: Secondary | ICD-10-CM | POA: Diagnosis not present

## 2023-11-17 DIAGNOSIS — R2681 Unsteadiness on feet: Secondary | ICD-10-CM | POA: Diagnosis not present

## 2023-11-17 DIAGNOSIS — J810 Acute pulmonary edema: Secondary | ICD-10-CM | POA: Diagnosis not present

## 2023-11-17 DIAGNOSIS — R278 Other lack of coordination: Secondary | ICD-10-CM | POA: Diagnosis not present

## 2023-11-18 DIAGNOSIS — R278 Other lack of coordination: Secondary | ICD-10-CM | POA: Diagnosis not present

## 2023-11-18 DIAGNOSIS — Q2733 Arteriovenous malformation of digestive system vessel: Secondary | ICD-10-CM | POA: Diagnosis not present

## 2023-11-18 DIAGNOSIS — M6281 Muscle weakness (generalized): Secondary | ICD-10-CM | POA: Diagnosis not present

## 2023-11-18 DIAGNOSIS — I480 Paroxysmal atrial fibrillation: Secondary | ICD-10-CM | POA: Diagnosis not present

## 2023-11-18 DIAGNOSIS — J810 Acute pulmonary edema: Secondary | ICD-10-CM | POA: Diagnosis not present

## 2023-11-18 DIAGNOSIS — R2681 Unsteadiness on feet: Secondary | ICD-10-CM | POA: Diagnosis not present

## 2023-11-19 DIAGNOSIS — R278 Other lack of coordination: Secondary | ICD-10-CM | POA: Diagnosis not present

## 2023-11-19 DIAGNOSIS — M6281 Muscle weakness (generalized): Secondary | ICD-10-CM | POA: Diagnosis not present

## 2023-11-19 DIAGNOSIS — I480 Paroxysmal atrial fibrillation: Secondary | ICD-10-CM | POA: Diagnosis not present

## 2023-11-19 DIAGNOSIS — J810 Acute pulmonary edema: Secondary | ICD-10-CM | POA: Diagnosis not present

## 2023-11-19 DIAGNOSIS — Q2733 Arteriovenous malformation of digestive system vessel: Secondary | ICD-10-CM | POA: Diagnosis not present

## 2023-11-19 DIAGNOSIS — R2681 Unsteadiness on feet: Secondary | ICD-10-CM | POA: Diagnosis not present

## 2023-11-23 ENCOUNTER — Ambulatory Visit (INDEPENDENT_AMBULATORY_CARE_PROVIDER_SITE_OTHER): Payer: Self-pay | Admitting: Cardiology

## 2023-11-23 DIAGNOSIS — Z7901 Long term (current) use of anticoagulants: Secondary | ICD-10-CM | POA: Diagnosis not present

## 2023-11-23 DIAGNOSIS — Z79899 Other long term (current) drug therapy: Secondary | ICD-10-CM | POA: Diagnosis not present

## 2023-11-23 DIAGNOSIS — Z5181 Encounter for therapeutic drug level monitoring: Secondary | ICD-10-CM | POA: Diagnosis not present

## 2023-11-23 LAB — POCT INR: INR: 2.2 (ref 2.0–3.0)

## 2023-12-07 ENCOUNTER — Ambulatory Visit (INDEPENDENT_AMBULATORY_CARE_PROVIDER_SITE_OTHER): Payer: Self-pay | Admitting: Cardiology

## 2023-12-07 DIAGNOSIS — Z5181 Encounter for therapeutic drug level monitoring: Secondary | ICD-10-CM | POA: Diagnosis not present

## 2023-12-07 DIAGNOSIS — Z7901 Long term (current) use of anticoagulants: Secondary | ICD-10-CM

## 2023-12-07 DIAGNOSIS — Z79899 Other long term (current) drug therapy: Secondary | ICD-10-CM | POA: Diagnosis not present

## 2023-12-07 LAB — POCT INR: INR: 2 (ref 2.0–3.0)

## 2023-12-13 DIAGNOSIS — Z7901 Long term (current) use of anticoagulants: Secondary | ICD-10-CM | POA: Diagnosis not present

## 2023-12-18 DIAGNOSIS — E113293 Type 2 diabetes mellitus with mild nonproliferative diabetic retinopathy without macular edema, bilateral: Secondary | ICD-10-CM | POA: Diagnosis not present

## 2023-12-18 DIAGNOSIS — H35371 Puckering of macula, right eye: Secondary | ICD-10-CM | POA: Diagnosis not present

## 2023-12-18 DIAGNOSIS — H353112 Nonexudative age-related macular degeneration, right eye, intermediate dry stage: Secondary | ICD-10-CM | POA: Diagnosis not present

## 2023-12-18 DIAGNOSIS — H353221 Exudative age-related macular degeneration, left eye, with active choroidal neovascularization: Secondary | ICD-10-CM | POA: Diagnosis not present

## 2023-12-18 DIAGNOSIS — H43813 Vitreous degeneration, bilateral: Secondary | ICD-10-CM | POA: Diagnosis not present

## 2023-12-21 ENCOUNTER — Ambulatory Visit (INDEPENDENT_AMBULATORY_CARE_PROVIDER_SITE_OTHER): Payer: Self-pay | Admitting: *Deleted

## 2023-12-21 DIAGNOSIS — Z5181 Encounter for therapeutic drug level monitoring: Secondary | ICD-10-CM

## 2023-12-21 DIAGNOSIS — Z7901 Long term (current) use of anticoagulants: Secondary | ICD-10-CM | POA: Diagnosis not present

## 2023-12-21 LAB — PROTIME-INR: INR: 1.64 — AB (ref 0.80–1.20)

## 2023-12-21 NOTE — Patient Instructions (Addendum)
 Description   Spoke with Marjean Donna, LPN at [facility] and to have pt take 2 tablets of warfarin (2mg ) today then continue taking 1.5 tablets daily, except 1 tablet Tuesday and Thursday. Recheck INR in 1 week. Patient at [facility]: Phone [phone]. Fax: [phone]. Call our office if you have any problems or concerns [phone]

## 2023-12-28 ENCOUNTER — Telehealth: Payer: Self-pay

## 2023-12-28 ENCOUNTER — Ambulatory Visit (INDEPENDENT_AMBULATORY_CARE_PROVIDER_SITE_OTHER): Payer: Self-pay

## 2023-12-28 DIAGNOSIS — Z79899 Other long term (current) drug therapy: Secondary | ICD-10-CM | POA: Diagnosis not present

## 2023-12-28 DIAGNOSIS — Z5181 Encounter for therapeutic drug level monitoring: Secondary | ICD-10-CM

## 2023-12-28 DIAGNOSIS — Z7901 Long term (current) use of anticoagulants: Secondary | ICD-10-CM | POA: Diagnosis not present

## 2023-12-28 LAB — PROTIME-INR: INR: 1.68 — AB (ref 0.80–1.20)

## 2023-12-28 NOTE — Telephone Encounter (Signed)
 INR due. Called and spoke with Marjean Donna, LPN at [facility]. INR has been drawn, results rending.

## 2023-12-28 NOTE — Patient Instructions (Signed)
 Description   Spoke with Marjean Donna, LPN at [facility] and instructed for pt to take 2 tablets of warfarin (2mg ) today and 2 tablets tomorrow and then START taking 1.5 tablets daily.  Recheck INR in 1 week. Patient at [facility]: Phone [phone]. Fax: [phone]. Call our office if you have any problems or concerns [phone]

## 2024-01-04 ENCOUNTER — Ambulatory Visit (INDEPENDENT_AMBULATORY_CARE_PROVIDER_SITE_OTHER): Payer: Self-pay | Admitting: *Deleted

## 2024-01-04 DIAGNOSIS — Z952 Presence of prosthetic heart valve: Secondary | ICD-10-CM

## 2024-01-04 DIAGNOSIS — I480 Paroxysmal atrial fibrillation: Secondary | ICD-10-CM | POA: Diagnosis not present

## 2024-01-04 DIAGNOSIS — Z7901 Long term (current) use of anticoagulants: Secondary | ICD-10-CM

## 2024-01-04 DIAGNOSIS — Z5181 Encounter for therapeutic drug level monitoring: Secondary | ICD-10-CM

## 2024-01-04 DIAGNOSIS — Z79899 Other long term (current) drug therapy: Secondary | ICD-10-CM | POA: Diagnosis not present

## 2024-01-04 LAB — PROTIME-INR: INR: 1.7 — AB (ref 0.80–1.20)

## 2024-01-04 NOTE — Patient Instructions (Addendum)
  Description   Spoke with Gareld June, LPN at [facility] and instructed for pt to take 2 tablets of warfarin (2mg ) today and 2 tablets tomorrow and then START taking 1.5 tablets daily except 2 tablets on  Fridays.  Recheck INR in 1 week. Patient at [facility]: Phone [phone]. Fax: [phone]. Call our office if you have any problems or concerns [phone]

## 2024-01-11 DIAGNOSIS — Z7901 Long term (current) use of anticoagulants: Secondary | ICD-10-CM | POA: Diagnosis not present

## 2024-01-12 ENCOUNTER — Ambulatory Visit (INDEPENDENT_AMBULATORY_CARE_PROVIDER_SITE_OTHER): Admitting: *Deleted

## 2024-01-12 ENCOUNTER — Encounter: Payer: Self-pay | Admitting: Cardiology

## 2024-01-12 DIAGNOSIS — Z7901 Long term (current) use of anticoagulants: Secondary | ICD-10-CM

## 2024-01-12 DIAGNOSIS — Z5181 Encounter for therapeutic drug level monitoring: Secondary | ICD-10-CM

## 2024-01-12 LAB — PROTIME-INR: INR: 1.93 — AB (ref 0.80–1.20)

## 2024-01-12 NOTE — Patient Instructions (Signed)
 Description   Spoke with Gareld June, LPN at [facility] and instructed for pt to take 2 tablets of warfarin (2mg ) today and 2 tablets tomorrow and then START taking 1.5 tablets daily except 2 tablets on Mondays, Wednesdays and Fridays.  Recheck INR in 1 week. Patient at [facility]: Phone [phone]. Fax: [phone]. Call our office if you have any problems or concerns [phone]

## 2024-01-17 NOTE — Progress Notes (Signed)
 Cardiology Office Note:    Date:  01/22/2024   ID:  [name], DOB 1933/07/11, MRN [id number]  PCP:  [facility]  Cardiologist:  Avery Bodo, MD  Electrophysiologist:  None   Referring MD: [facility]   Chief Complaint  Patient presents with   Coronary Artery Disease    History of Present Illness:    [name] is a 89 y.o. female with a hx of CAD status post CABG with subsequent PCI to RCA with BMS, mechanical AVR, hypertension, hyperlipidemia, diabetes, GI bleeding, CKD, paroxysmal atrial fibrillation, chronic diastolic heart failure.  She previously followed with Dr. Jacquelynn Matter, last seen 04/2023.  She underwent CABG with mechanical AVR in [place] in the 90s.  She was evaluated in 03/2015 for anginal symptoms.  Cath showed severe native vessel disease, patent LIMA-LAD and SVG-diagonal, occluded SVG-OM/PDA (second half of OM/PDA graft was open), 75% ostial RCA stenosis treated with DES.  She was admitted with sepsis due to E. coli bacteremia from UTI 11/2018.  TTE suggested possible mitral valve vegetation, TEE was negative for vegetations.  She underwent DCCV 07/2019 for A-fib.  She was admitted with GI bleeding 10/2019.  She was admitted again with GI bleeding 09/02/2023.  Hemoglobin 6.3 on presentation, improved with transfusion was 9.3 on discharge.  Enteroscopy showed gastric AVMs but no high risk stigmata for bleeding, family declined colonoscopy or repeat capsule endoscopy.  She was restarted on Coumadin .  Also volume overloaded during admission, she was diuresed with IV Lasix .  Echocardiogram 08/27/2023 showed EF 55 to 60%, low normal RV function, mild left atrial enlargement, moderate right atrial enlargement, severe tricuspid regurgitation, mechanical aortic valve with mean gradient 8 mmHg.  Zio patch x 14 days 09/2023 showed 100% A-fib burden, average rate 86 bpm.  Since last clinic visit, she reports she is doing well.  Denies any chest  pain, dyspnea, lightheadedness, syncope, or palpitations.  Continues to have RLE edema.  She is mostly wheelchair-bound but does physical therapy with walker 4 days/week.    Wt Readings from Last 3 Encounters:  01/22/24 130 lb (59 kg)  10/02/23 151 lb (68.5 kg)  09/02/23 154 lb 12.2 oz (70.2 kg)     Past Medical History:  Diagnosis Date   Anemia    Anginal pain (HCC)    Arteriovenous malformation of gastrointestinal tract    Arthritis    "fingers mostly" (04/12/2015)   Carotid artery disease (HCC)    Carotid US  3/17:  60-79% RICA; 40-59% LICA; Elevated bilateral subclavian artery velocities >>f/u 1 year. // Carotid US  4/18: R 40-59; L 1-39 >> FU 1 year // Carotid US  10/2018: R 40-59; L 1-39, L subclavian stenosis    CHF (congestive heart failure) (HCC)    Chronic kidney disease (CKD), stage III (moderate) (HCC)    Chronic lower GI bleeding    "today; last time was ~ 8 yr ago; used to have them often before that too" (02/11/2013)   Coronary artery disease    DJD (degenerative joint disease)    Dysrhythmia    Heart murmur    History of blood transfusion    "a few times over the years; usually related to my Coumadin " (04/12/2015   History of gout    Hyperlipemia    Hypertension    Macula lutea degeneration    Old MI (myocardial infarction)    "a coulple /dr in 02/2008; I never even knew I'd had them" (04/12/2015)   Type II  diabetes mellitus (HCC)    no medications, diet controlled    Past Surgical History:  Procedure Laterality Date   AORTIC VALVE REPLACEMENT  09/23/1991   St. Jude   APPENDECTOMY  09/23/1951   BIOPSY  11/06/2019   Procedure: BIOPSY;  Surgeon: Albertina Hugger, MD;  Location: [facility] ENDOSCOPY;  Service: Gastroenterology;;   CARDIAC CATHETERIZATION  ~1990   CARDIAC CATHETERIZATION  04/12/2015   Procedure: Coronary Stent Intervention;  Surgeon: Lucendia Rusk, MD;    SYNERGY DES 3.5X16 to the ostial RCA    CARDIAC CATHETERIZATION  04/12/2015   Procedure:  Coronary/Graft Angiography;  Surgeon: Jeffory Mings, MD; LAD & CFX 100%, patent LIMA-LAD, SVG-D1; SVG-OM-PDA first limb 100%, 2nd limb patent; oRCA 75%>0 w/ stent   CARDIAC VALVE REPLACEMENT  09/22/1992   St. Jude/notes 10/29/2003 (02/11/2013)   CARDIOVERSION N/A 08/15/2019   Procedure: CARDIOVERSION;  Surgeon: Lake Pilgrim, MD;  Location: [facility] ENDOSCOPY;  Service: Cardiovascular;  Laterality: N/A;   COLONOSCOPY N/A 02/13/2013   Procedure: COLONOSCOPY;  Surgeon: Pietro Bridegroom, MD;  Location: [facility] ENDOSCOPY;  Service: Endoscopy;  Laterality: N/A;   CORONARY ANGIOPLASTY WITH STENT PLACEMENT  2009+   "3 at least; put in 1 stent at a time" (02/11/2013)   CORONARY ARTERY BYPASS GRAFT  09/22/1988   LIMA-LAD, SVG-OM-PDA, SVG-D1   DILATION AND CURETTAGE OF UTERUS  05/24/1959   'after a miscarriage" (02/11/2013)   ENTEROSCOPY N/A 02/13/2013   Procedure: ENTEROSCOPY;  Surgeon: Pietro Bridegroom, MD;  Location: [facility] ENDOSCOPY;  Service: Endoscopy;  Laterality: N/A;   ENTEROSCOPY N/A 11/06/2019   Procedure: ENTEROSCOPY;  Surgeon: Albertina Hugger, MD;  Location: [facility] ENDOSCOPY;  Service: Gastroenterology;  Laterality: N/A;   ENTEROSCOPY N/A 08/26/2023   Procedure: ENTEROSCOPY;  Surgeon: Ace Holder, MD;  Location: [facility] ENDOSCOPY;  Service: Gastroenterology;  Laterality: N/A;   ESOPHAGOGASTRODUODENOSCOPY (EGD) WITH PROPOFOL  N/A 12/01/2019   Procedure: ESOPHAGOGASTRODUODENOSCOPY (EGD) WITH PROPOFOL ;  Surgeon: Sergio Dandy, MD;  Location: [facility] ENDOSCOPY;  Service: Endoscopy;  Laterality: N/A;   EXPLORATORY LAPAROTOMY  02/24/2008   which revealed a retroperitoneal hematoma and bleeding from the right external iliac artery/notes 03/02/2008 (02/11/2013)    FLEXIBLE SIGMOIDOSCOPY N/A 04/21/2015   Procedure: FLEXIBLE SIGMOIDOSCOPY;  Surgeon: Asencion Blacksmith, MD;  Location: [facility] ENDOSCOPY;  Service: Endoscopy;  Laterality: N/A;   HOT HEMOSTASIS N/A 11/06/2019   Procedure: HOT HEMOSTASIS (ARGON PLASMA  COAGULATION/BICAP);  Surgeon: Albertina Hugger, MD;  Location: [facility] ENDOSCOPY;  Service: Gastroenterology;  Laterality: N/A;   HOT HEMOSTASIS N/A 08/26/2023   Procedure: HOT HEMOSTASIS (ARGON PLASMA COAGULATION/BICAP);  Surgeon: Ace Holder, MD;  Location: [facility] ENDOSCOPY;  Service: Gastroenterology;  Laterality: N/A;   LUMBAR LAMINECTOMY/DECOMPRESSION MICRODISCECTOMY Left 06/02/2022   Procedure: LEFT LUMBAR FOUR-FIVE MICRODISCECTOMY;  Surgeon: Garry Kansas, MD;  Location: [facility] OR;  Service: Neurosurgery;  Laterality: Left;  3C   TEE WITHOUT CARDIOVERSION N/A 12/01/2018   Procedure: TRANSESOPHAGEAL ECHOCARDIOGRAM (TEE);  Surgeon: Hugh Madura, MD;  Location: [facility] ENDOSCOPY;  Service: Cardiovascular;  Laterality: N/A;    Current Medications: Current Meds  Medication Sig   atorvastatin  (LIPITOR ) 40 MG tablet Take 1 tablet (40 mg total) by mouth daily. (Patient taking differently: Take 40 mg by mouth at bedtime.)   clonazePAM  (KLONOPIN ) 0.5 MG tablet Take 1 tablet (0.5 mg total) by mouth 2 (two) times daily as needed for anxiety.   Emollient (BAG BALM) OINT Apply 1 Application topically daily. Apply to bilateral lower extremities for dry,  scaly skin   ezetimibe  (ZETIA ) 10 MG tablet Take 1 tablet by mouth once daily (Patient taking differently: Take 10 mg by mouth at bedtime.)   FeFum-FePo-FA-B Cmp-C-Zn-Mn-Cu (SE-TAN PLUS ) 162-115.2-1 MG CAPS Take 1 capsule by mouth in the morning and at bedtime.   folic acid  (FOLVITE ) 400 MCG tablet Take 400 mcg by mouth in the morning.   isosorbide  mononitrate (IMDUR ) 60 MG 24 hr tablet Take 1 tablet by mouth once daily (Patient taking differently: 60 mg daily.)   levothyroxine  (SYNTHROID ) 88 MCG tablet Take 88 mcg by mouth at bedtime.   memantine (NAMENDA) 5 MG tablet Take 5 mg by mouth 2 (two) times daily.   Multiple Vitamin (MULTIVITAMIN WITH MINERALS) TABS Take 1 tablet by mouth daily with breakfast.   Multiple Vitamins-Minerals (PRESERVISION AREDS 2  PO) Take 1 capsule by mouth 2 (two) times daily.   nitroGLYCERIN  (NITROSTAT ) 0.4 MG SL tablet DISSOLVE ONE TABLET UNDER THE TONGUE EVERY 5 MINUTES AS NEEDED FOR CHEST PAIN.  DO NOT EXCEED A TOTAL OF 3 DOSES IN 15 MINUTES (Patient taking differently: Place 0.4 mg under the tongue every 5 (five) minutes x 3 doses as needed for chest pain.)   Nutritional Supplements (BOOST GLUCOSE CONTROL PO) Take 1 Bottle by mouth daily.   Omega-3 Fatty Acids (FISH OIL PO) Take 700 mg by mouth 2 (two) times daily.   oxyCODONE  (OXY IR/ROXICODONE ) 5 MG immediate release tablet Take 1 tablet (5 mg total) by mouth every 6 (six) hours as needed for moderate pain (pain score 4-6) or severe pain (pain score 7-10).   pantoprazole  (PROTONIX ) 40 MG tablet Take 1 tablet (40 mg total) by mouth 2 (two) times daily.   polyethylene glycol (MIRALAX  / GLYCOLAX ) 17 g packet Take 17 g by mouth daily as needed for mild constipation.   potassium chloride  (MICRO-K ) 10 MEQ CR capsule Take 20 mEq by mouth daily.   sertraline (ZOLOFT) 25 MG tablet Take 25 mg by mouth daily.   torsemide (DEMADEX) 20 MG tablet Take 20 mg by mouth 2 (two) times daily.   warfarin (COUMADIN ) 1 MG tablet Take 0.5 tablets (0.5 mg total) by mouth daily at 4 PM. Give 0.5mg  tablet daily     Allergies:   Capzasin [capsaicin], Darvon [propoxyphene], Lisinopril, Septra [sulfamethoxazole-trimethoprim], Voltaren [diclofenac], Warfarin and related, Tobrex [tobramycin], Trazodone  and nefazodone, Zolpidem  tartrate, and Penicillins   Social History   Socioeconomic History   Marital status: Married    Spouse name: Not on file   Number of children: 2   Years of education: Not on file   Highest education level: Not on file  Occupational History   Not on file  Tobacco Use   Smoking status: Never   Smokeless tobacco: Never  Vaping Use   Vaping status: Never Used  Substance and Sexual Activity   Alcohol use: No   Drug use: No   Sexual activity: Yes  Other Topics  Concern   Not on file  Social History Narrative   Not on file   Social Drivers of Health   Financial Resource Strain: Not on file  Food Insecurity: No Food Insecurity (08/26/2023)   Hunger Vital Sign    Worried About Running Out of Food in the Last Year: Never true    Ran Out of Food in the Last Year: Never true  Transportation Needs: No Transportation Needs (08/26/2023)   PRAPARE - Administrator, Civil Service (Medical): No    Lack of Transportation (Non-Medical): No  Physical Activity: Not on file  Stress: Not on file  Social Connections: Not on file     Family History: The patient's family history includes Heart disease in her father and mother.  ROS:   Please see the history of present illness.     All other systems reviewed and are negative.  EKGs/Labs/Other Studies Reviewed:    The following studies were reviewed today:   EKG:   10/02/23: Atrial fibrillation, rate 94, LVH 01/22/24: Afib, rate 69, LBBB  Recent Labs: 08/26/2023: B Natriuretic Peptide 581.0; TSH 2.352 09/02/2023: Hemoglobin 9.3; Platelets 151 10/02/2023: ALT 100; BUN 30; Creatinine, Ser 1.73; Magnesium 2.1; Potassium 4.2; Sodium 146  Recent Lipid Panel    Component Value Date/Time   CHOL 117 06/29/2019 0953   CHOL 172 05/01/2015 1234   TRIG 153 (H) 06/29/2019 0953   TRIG 248 (H) 05/01/2015 1234   HDL 51 06/29/2019 0953   HDL 46 05/01/2015 1234   CHOLHDL 2.3 06/29/2019 0953   LDLCALC 40 06/29/2019 0953   LDLCALC 76 05/01/2015 1234    Physical Exam:    VS:  BP 110/62 (BP Location: Left Arm, Patient Position: Sitting)   Ht 5' (1.524 m)   Wt 130 lb (59 kg)   SpO2 97%   BMI 25.39 kg/m     Wt Readings from Last 3 Encounters:  01/22/24 130 lb (59 kg)  10/02/23 151 lb (68.5 kg)  09/02/23 154 lb 12.2 oz (70.2 kg)     GEN:  in no acute distress HEENT: Normal NECK: No JVD CARDIAC: Irregular, normal rate, mechanical click no murmurs RESPIRATORY:  Clear to auscultation without  rales, wheezing or rhonchi  ABDOMEN: Soft, non-tender, non-distended MUSCULOSKELETAL: Asymmetric right greater than left lower extremity edema SKIN: Warm and dry NEUROLOGIC: Oriented to person only PSYCHIATRIC:  Normal affect   ASSESSMENT:    1. Coronary artery disease involving native coronary artery of native heart without angina pectoris   2. Permanent atrial fibrillation (HCC)   3. Essential hypertension   4. H/O mechanical aortic valve replacement   5. Chronic diastolic CHF (congestive heart failure) (HCC)     PLAN:    CAD:  She underwent CABG with mechanical AVR in [place] in the 90s.  She was evaluated in 03/2015 for anginal symptoms.  Cath showed severe native vessel disease, patent LIMA-LAD and SVG-diagonal, occluded SVG-OM/PDA (second half of OM/PDA graft was open), 75% ostial RCA stenosis treated with DES. -Continue Coumadin , statin -Continue Imdur  60 mg daily  Atrial fibrillation: Status post cardioversion 07/2019.  Currently in rate controlled A-fib.  Suspect likely permanent atrial fibrillation at this point.  Zio patch x 14 days 09/2023 showed 100% A-fib burden, average rate 86 bpm. - Continue Toprol -XL 12.5 mg daily -Continue Coumadin , follows in Coumadin  clinic  Status post mechanical AVR: On Coumadin .  Has been complicated by GI bleeding, most recently 08/2023.  Echocardiogram 08/27/2023 showed EF 55 to 60%, low normal RV function, mild left atrial enlargement, moderate right atrial enlargement, severe tricuspid regurgitation, mechanical aortic valve with mean gradient 8 mmHg.  Chronic diastolic heart failure: Currently on torsemide 20 mg twice daily.  Appears euvolemic.  Check BMET, magnesium  Hypertension: On Imdur .  Appears controlled  Hyperlipidemia: On atorvastatin  40 mg daily and Zetia  10 mg daily  CKD stage IIIb: Most recent creatinine 1.73 on 10/02/2023.  Check BMET  RTC in 6 months   Medication Adjustments/Labs and Tests Ordered: Current medicines are  reviewed at length with the patient today.  Concerns  regarding medicines are outlined above.  Orders Placed This Encounter  Procedures   Basic metabolic panel with GFR   Magnesium   EKG 12-Lead   No orders of the defined types were placed in this encounter.   Patient Instructions  Medication Instructions:  Continue same medications *If you need a refill on your cardiac medications before your next appointment, please call your pharmacy*  Lab Work: Bmet,magnesium today   Testing/Procedures: None ordered  Follow-Up: At [facility], you and your health needs are our priority.  As part of our continuing mission to provide you with exceptional heart care, our providers are all part of one team.  This team includes your primary Cardiologist (physician) and Advanced Practice Providers or APPs (Physician Assistants and Nurse Practitioners) who all work together to provide you with the care you need, when you need it.  Your next appointment:  6 months    Provider:  Dr.Keyaria Lawson   We recommend signing up for the patient portal called "MyChart".  Sign up information is provided on this After Visit Summary.  MyChart is used to connect with patients for Virtual Visits (Telemedicine).  Patients are able to view lab/test results, encounter notes, upcoming appointments, etc.  Non-urgent messages can be sent to your provider as well.   To learn more about what you can do with MyChart, go to [email or web address].         Signed, Wendie Hamburg, MD  01/22/2024 1:06 PM    [facility]

## 2024-01-18 ENCOUNTER — Ambulatory Visit (INDEPENDENT_AMBULATORY_CARE_PROVIDER_SITE_OTHER): Payer: Self-pay | Admitting: Cardiovascular Disease

## 2024-01-18 DIAGNOSIS — Z5181 Encounter for therapeutic drug level monitoring: Secondary | ICD-10-CM

## 2024-01-18 DIAGNOSIS — Z7901 Long term (current) use of anticoagulants: Secondary | ICD-10-CM | POA: Diagnosis not present

## 2024-01-18 LAB — PROTIME-INR: INR: 1.8 — AB (ref 0.80–1.20)

## 2024-01-22 ENCOUNTER — Encounter: Payer: Self-pay | Admitting: Cardiology

## 2024-01-22 ENCOUNTER — Ambulatory Visit: Payer: Medicare Other | Attending: Cardiology | Admitting: Cardiology

## 2024-01-22 VITALS — BP 110/62 | Ht 60.0 in | Wt 130.0 lb

## 2024-01-22 DIAGNOSIS — I5032 Chronic diastolic (congestive) heart failure: Secondary | ICD-10-CM | POA: Diagnosis not present

## 2024-01-22 DIAGNOSIS — I10 Essential (primary) hypertension: Secondary | ICD-10-CM | POA: Insufficient documentation

## 2024-01-22 DIAGNOSIS — Z952 Presence of prosthetic heart valve: Secondary | ICD-10-CM | POA: Insufficient documentation

## 2024-01-22 DIAGNOSIS — I2510 Atherosclerotic heart disease of native coronary artery without angina pectoris: Secondary | ICD-10-CM | POA: Insufficient documentation

## 2024-01-22 DIAGNOSIS — I4821 Permanent atrial fibrillation: Secondary | ICD-10-CM | POA: Diagnosis not present

## 2024-01-22 NOTE — Patient Instructions (Signed)
 Medication Instructions:  Continue same medications *If you need a refill on your cardiac medications before your next appointment, please call your pharmacy*  Lab Work: Bmet,magnesium today   Testing/Procedures: None ordered  Follow-Up: At [facility], you and your health needs are our priority.  As part of our continuing mission to provide you with exceptional heart care, our providers are all part of one team.  This team includes your primary Cardiologist (physician) and Advanced Practice Providers or APPs (Physician Assistants and Nurse Practitioners) who all work together to provide you with the care you need, when you need it.  Your next appointment:  6 months    Provider:  Dr.Schumann   We recommend signing up for the patient portal called "MyChart".  Sign up information is provided on this After Visit Summary.  MyChart is used to connect with patients for Virtual Visits (Telemedicine).  Patients are able to view lab/test results, encounter notes, upcoming appointments, etc.  Non-urgent messages can be sent to your provider as well.   To learn more about what you can do with MyChart, go to [email or web address].

## 2024-01-23 LAB — BASIC METABOLIC PANEL WITH GFR
BUN/Creatinine Ratio: 17 (ref 12–28)
BUN: 28 mg/dL — ABNORMAL HIGH (ref 8–27)
CO2: 24 mmol/L (ref 20–29)
Calcium: 9.3 mg/dL (ref 8.7–10.3)
Chloride: 103 mmol/L (ref 96–106)
Creatinine, Ser: 1.64 mg/dL — ABNORMAL HIGH (ref 0.57–1.00)
Glucose: 109 mg/dL — ABNORMAL HIGH (ref 70–99)
Potassium: 3.7 mmol/L (ref 3.5–5.2)
Sodium: 145 mmol/L — ABNORMAL HIGH (ref 134–144)
eGFR: 30 mL/min/{1.73_m2} — ABNORMAL LOW (ref >59)

## 2024-01-23 LAB — MAGNESIUM: Magnesium: 2 mg/dL (ref 1.6–2.3)

## 2024-01-25 ENCOUNTER — Ambulatory Visit (INDEPENDENT_AMBULATORY_CARE_PROVIDER_SITE_OTHER): Payer: Self-pay

## 2024-01-25 DIAGNOSIS — Z7901 Long term (current) use of anticoagulants: Secondary | ICD-10-CM | POA: Diagnosis not present

## 2024-01-25 DIAGNOSIS — Z5181 Encounter for therapeutic drug level monitoring: Secondary | ICD-10-CM

## 2024-01-25 LAB — PROTIME-INR: INR: 2.2 — AB (ref 0.80–1.20)

## 2024-01-25 NOTE — Patient Instructions (Signed)
 Description   Spoke with [facility] and instructed for pt to continue taking 2 tablets daily except 1.5 tablets on Mondays, Wednesdays and Fridays.  Recheck INR in 1 week. Patient at [facility]: Phone [phone]. Fax: [phone]. Call our office if you have any problems or concerns [phone]

## 2024-02-01 ENCOUNTER — Ambulatory Visit (INDEPENDENT_AMBULATORY_CARE_PROVIDER_SITE_OTHER): Payer: Self-pay

## 2024-02-01 DIAGNOSIS — R791 Abnormal coagulation profile: Secondary | ICD-10-CM | POA: Diagnosis not present

## 2024-02-01 DIAGNOSIS — Z5181 Encounter for therapeutic drug level monitoring: Secondary | ICD-10-CM

## 2024-02-01 DIAGNOSIS — I480 Paroxysmal atrial fibrillation: Secondary | ICD-10-CM

## 2024-02-01 DIAGNOSIS — Z7901 Long term (current) use of anticoagulants: Secondary | ICD-10-CM

## 2024-02-01 DIAGNOSIS — Z79899 Other long term (current) drug therapy: Secondary | ICD-10-CM | POA: Diagnosis not present

## 2024-02-01 DIAGNOSIS — I10 Essential (primary) hypertension: Secondary | ICD-10-CM | POA: Diagnosis not present

## 2024-02-01 DIAGNOSIS — Z952 Presence of prosthetic heart valve: Secondary | ICD-10-CM

## 2024-02-01 LAB — POCT INR: INR: 1.6 — AB (ref 2.0–3.0)

## 2024-02-01 NOTE — Patient Instructions (Signed)
 Description   Spoke with [facility] and instructed for pt to take 2 tablets today, then start taking 2 tablets daily except 1.5 tablets on Mondays and Fridays.  Recheck INR in 1 week. Patient at [facility]: Phone [phone]. Fax: [phone]. Call our office if you have any problems or concerns [phone]

## 2024-02-08 DIAGNOSIS — Z79899 Other long term (current) drug therapy: Secondary | ICD-10-CM | POA: Diagnosis not present

## 2024-02-08 DIAGNOSIS — Z7901 Long term (current) use of anticoagulants: Secondary | ICD-10-CM | POA: Diagnosis not present

## 2024-02-08 LAB — PROTIME-INR: INR: 2.61 — AB (ref 0.80–1.20)

## 2024-02-09 ENCOUNTER — Ambulatory Visit (INDEPENDENT_AMBULATORY_CARE_PROVIDER_SITE_OTHER)

## 2024-02-09 ENCOUNTER — Ambulatory Visit: Payer: Self-pay | Admitting: Interventional Cardiology

## 2024-02-09 DIAGNOSIS — I480 Paroxysmal atrial fibrillation: Secondary | ICD-10-CM

## 2024-02-09 DIAGNOSIS — Z5181 Encounter for therapeutic drug level monitoring: Secondary | ICD-10-CM

## 2024-02-09 DIAGNOSIS — Z7901 Long term (current) use of anticoagulants: Secondary | ICD-10-CM

## 2024-02-09 NOTE — Patient Instructions (Signed)
 Description   Spoke with Tammy, RN at [facility] and instructed for pt to continue taking 2 tablets daily except 1.5 tablets on Mondays and Fridays.  Recheck INR in 1 week. Patient at [facility]: Phone [phone]. Fax: [phone]. Call our office if you have any problems or concerns [phone]

## 2024-02-15 DIAGNOSIS — Z7901 Long term (current) use of anticoagulants: Secondary | ICD-10-CM | POA: Diagnosis not present

## 2024-02-15 LAB — PROTIME-INR: INR: 1.41 — AB (ref 0.80–1.20)

## 2024-02-16 ENCOUNTER — Ambulatory Visit (INDEPENDENT_AMBULATORY_CARE_PROVIDER_SITE_OTHER)

## 2024-02-16 DIAGNOSIS — Z5181 Encounter for therapeutic drug level monitoring: Secondary | ICD-10-CM

## 2024-02-16 DIAGNOSIS — I480 Paroxysmal atrial fibrillation: Secondary | ICD-10-CM

## 2024-02-16 DIAGNOSIS — Z952 Presence of prosthetic heart valve: Secondary | ICD-10-CM

## 2024-02-16 DIAGNOSIS — Z7901 Long term (current) use of anticoagulants: Secondary | ICD-10-CM

## 2024-02-16 NOTE — Patient Instructions (Signed)
 Description   Spoke with Tammy, RN at [facility] and instructed for pt to take 3mg  today, then resume same dosage of Warfarin 2 tablets daily except 1.5 tablets on Mondays and Fridays.  Recheck INR in 1 week. Patient at [facility]: Phone [phone]. Fax: [phone]. Call our office if you have any problems or concerns [phone]

## 2024-02-22 ENCOUNTER — Ambulatory Visit (INDEPENDENT_AMBULATORY_CARE_PROVIDER_SITE_OTHER): Payer: Self-pay | Admitting: Cardiovascular Disease

## 2024-02-22 ENCOUNTER — Telehealth: Payer: Self-pay

## 2024-02-22 DIAGNOSIS — Z79899 Other long term (current) drug therapy: Secondary | ICD-10-CM | POA: Diagnosis not present

## 2024-02-22 DIAGNOSIS — I480 Paroxysmal atrial fibrillation: Secondary | ICD-10-CM | POA: Diagnosis not present

## 2024-02-22 DIAGNOSIS — Z7901 Long term (current) use of anticoagulants: Secondary | ICD-10-CM

## 2024-02-22 DIAGNOSIS — Z5181 Encounter for therapeutic drug level monitoring: Secondary | ICD-10-CM

## 2024-02-22 LAB — PROTIME-INR: INR: 1.62 — AB (ref 0.80–1.20)

## 2024-02-22 NOTE — Telephone Encounter (Signed)
 INR due. Elleen at [facility] states INR results are pending and she will call coumadin  clinic with results this evening before 5pm or first thing tomorrow morning.

## 2024-02-26 DIAGNOSIS — H353112 Nonexudative age-related macular degeneration, right eye, intermediate dry stage: Secondary | ICD-10-CM | POA: Diagnosis not present

## 2024-02-26 DIAGNOSIS — H35371 Puckering of macula, right eye: Secondary | ICD-10-CM | POA: Diagnosis not present

## 2024-02-26 DIAGNOSIS — H353221 Exudative age-related macular degeneration, left eye, with active choroidal neovascularization: Secondary | ICD-10-CM | POA: Diagnosis not present

## 2024-02-26 DIAGNOSIS — Z79899 Other long term (current) drug therapy: Secondary | ICD-10-CM | POA: Diagnosis not present

## 2024-02-26 DIAGNOSIS — Z7901 Long term (current) use of anticoagulants: Secondary | ICD-10-CM | POA: Diagnosis not present

## 2024-02-26 DIAGNOSIS — H43813 Vitreous degeneration, bilateral: Secondary | ICD-10-CM | POA: Diagnosis not present

## 2024-02-26 DIAGNOSIS — R8290 Unspecified abnormal findings in urine: Secondary | ICD-10-CM | POA: Diagnosis not present

## 2024-02-26 DIAGNOSIS — E113293 Type 2 diabetes mellitus with mild nonproliferative diabetic retinopathy without macular edema, bilateral: Secondary | ICD-10-CM | POA: Diagnosis not present

## 2024-02-29 ENCOUNTER — Ambulatory Visit (INDEPENDENT_AMBULATORY_CARE_PROVIDER_SITE_OTHER): Payer: Self-pay | Admitting: Internal Medicine

## 2024-02-29 DIAGNOSIS — Z5181 Encounter for therapeutic drug level monitoring: Secondary | ICD-10-CM

## 2024-02-29 DIAGNOSIS — Z7901 Long term (current) use of anticoagulants: Secondary | ICD-10-CM | POA: Diagnosis not present

## 2024-02-29 DIAGNOSIS — Z79899 Other long term (current) drug therapy: Secondary | ICD-10-CM | POA: Diagnosis not present

## 2024-02-29 LAB — PROTIME-INR: INR: 1.5 — AB (ref 0.80–1.20)

## 2024-03-01 DIAGNOSIS — N390 Urinary tract infection, site not specified: Secondary | ICD-10-CM | POA: Diagnosis not present

## 2024-03-04 DIAGNOSIS — I2510 Atherosclerotic heart disease of native coronary artery without angina pectoris: Secondary | ICD-10-CM | POA: Diagnosis not present

## 2024-03-04 DIAGNOSIS — E039 Hypothyroidism, unspecified: Secondary | ICD-10-CM | POA: Diagnosis not present

## 2024-03-04 DIAGNOSIS — D6869 Other thrombophilia: Secondary | ICD-10-CM | POA: Diagnosis not present

## 2024-03-04 DIAGNOSIS — I480 Paroxysmal atrial fibrillation: Secondary | ICD-10-CM | POA: Diagnosis not present

## 2024-03-04 DIAGNOSIS — F0390 Unspecified dementia without behavioral disturbance: Secondary | ICD-10-CM | POA: Diagnosis not present

## 2024-03-04 DIAGNOSIS — E785 Hyperlipidemia, unspecified: Secondary | ICD-10-CM | POA: Diagnosis not present

## 2024-03-04 DIAGNOSIS — I10 Essential (primary) hypertension: Secondary | ICD-10-CM | POA: Diagnosis not present

## 2024-03-04 DIAGNOSIS — I5030 Unspecified diastolic (congestive) heart failure: Secondary | ICD-10-CM | POA: Diagnosis not present

## 2024-03-04 DIAGNOSIS — E1140 Type 2 diabetes mellitus with diabetic neuropathy, unspecified: Secondary | ICD-10-CM | POA: Diagnosis not present

## 2024-03-04 DIAGNOSIS — Z952 Presence of prosthetic heart valve: Secondary | ICD-10-CM | POA: Diagnosis not present

## 2024-03-04 DIAGNOSIS — F411 Generalized anxiety disorder: Secondary | ICD-10-CM | POA: Diagnosis not present

## 2024-03-07 ENCOUNTER — Ambulatory Visit (INDEPENDENT_AMBULATORY_CARE_PROVIDER_SITE_OTHER): Payer: Self-pay

## 2024-03-07 DIAGNOSIS — Z5181 Encounter for therapeutic drug level monitoring: Secondary | ICD-10-CM

## 2024-03-07 DIAGNOSIS — Z79899 Other long term (current) drug therapy: Secondary | ICD-10-CM | POA: Diagnosis not present

## 2024-03-07 DIAGNOSIS — Z7901 Long term (current) use of anticoagulants: Secondary | ICD-10-CM | POA: Diagnosis not present

## 2024-03-07 LAB — PROTIME-INR: INR: 2.1 — AB (ref 0.80–1.20)

## 2024-03-07 NOTE — Patient Instructions (Signed)
 Description   Spoke with Diego Foy, LPN at [facility] and instructed for pt to continue 2 tablets daily Recheck INR in 1 week. Patient at [facility]: Phone [phone]. Fax: [phone]. Call our office if you have any problems or concerns [phone]

## 2024-03-08 DIAGNOSIS — N390 Urinary tract infection, site not specified: Secondary | ICD-10-CM | POA: Diagnosis not present

## 2024-03-08 DIAGNOSIS — Z79899 Other long term (current) drug therapy: Secondary | ICD-10-CM | POA: Diagnosis not present

## 2024-03-09 DIAGNOSIS — F0390 Unspecified dementia without behavioral disturbance: Secondary | ICD-10-CM | POA: Diagnosis not present

## 2024-03-09 DIAGNOSIS — F39 Unspecified mood [affective] disorder: Secondary | ICD-10-CM | POA: Diagnosis not present

## 2024-03-14 ENCOUNTER — Ambulatory Visit (INDEPENDENT_AMBULATORY_CARE_PROVIDER_SITE_OTHER): Payer: Self-pay | Admitting: *Deleted

## 2024-03-14 DIAGNOSIS — Z5181 Encounter for therapeutic drug level monitoring: Secondary | ICD-10-CM

## 2024-03-14 DIAGNOSIS — F03A4 Unspecified dementia, mild, with anxiety: Secondary | ICD-10-CM | POA: Diagnosis not present

## 2024-03-14 DIAGNOSIS — F5101 Primary insomnia: Secondary | ICD-10-CM | POA: Diagnosis not present

## 2024-03-14 DIAGNOSIS — F411 Generalized anxiety disorder: Secondary | ICD-10-CM | POA: Diagnosis not present

## 2024-03-14 DIAGNOSIS — Z7901 Long term (current) use of anticoagulants: Secondary | ICD-10-CM

## 2024-03-14 DIAGNOSIS — Z952 Presence of prosthetic heart valve: Secondary | ICD-10-CM

## 2024-03-14 DIAGNOSIS — R109 Unspecified abdominal pain: Secondary | ICD-10-CM | POA: Diagnosis not present

## 2024-03-14 LAB — PROTIME-INR: INR: 1.85 — AB (ref 0.80–1.20)

## 2024-03-14 NOTE — Patient Instructions (Addendum)
 Description   Spoke with Ilene, LPN at [facility] and instructed for pt to take 2.5 tablets of warfarin today then continue taking 2 tablets daily.  Recheck INR in 1 week. Patient at [facility]: Phone [phone]. Fax: [phone]. Call our office if you have any problems or concerns [phone]

## 2024-03-14 NOTE — Progress Notes (Signed)
Please see anticoagulation encounter.

## 2024-03-15 DIAGNOSIS — R530 Neoplastic (malignant) related fatigue: Secondary | ICD-10-CM | POA: Diagnosis not present

## 2024-03-21 ENCOUNTER — Ambulatory Visit (INDEPENDENT_AMBULATORY_CARE_PROVIDER_SITE_OTHER): Payer: Self-pay

## 2024-03-21 DIAGNOSIS — Z7901 Long term (current) use of anticoagulants: Secondary | ICD-10-CM | POA: Diagnosis not present

## 2024-03-21 DIAGNOSIS — Z5181 Encounter for therapeutic drug level monitoring: Secondary | ICD-10-CM

## 2024-03-21 LAB — PROTIME-INR: INR: 1.87 — AB (ref 0.80–1.20)

## 2024-03-21 NOTE — Progress Notes (Signed)
Please see anticoagulation encounter.

## 2024-03-21 NOTE — Patient Instructions (Signed)
 Description   Spoke with IIlene, LPN at [facility] and instructed for pt to take 3 tablets of warfarin today then START taking 2 tablets daily except 3 tablets on Sundays.  Recheck INR in 1 week. Patient at [facility]: Phone [phone]. Fax: [phone]. Call our office if you have any problems or concerns [phone]

## 2024-03-28 ENCOUNTER — Telehealth: Payer: Self-pay | Admitting: *Deleted

## 2024-03-28 DIAGNOSIS — Z7901 Long term (current) use of anticoagulants: Secondary | ICD-10-CM | POA: Diagnosis not present

## 2024-03-28 DIAGNOSIS — Z79899 Other long term (current) drug therapy: Secondary | ICD-10-CM | POA: Diagnosis not present

## 2024-03-28 NOTE — Telephone Encounter (Signed)
 Spoke with Illene at [facility] and she states the patient's labs had been drawn this morning and will call once she receives them. Will await and follow up

## 2024-03-29 ENCOUNTER — Ambulatory Visit (INDEPENDENT_AMBULATORY_CARE_PROVIDER_SITE_OTHER): Admitting: Pharmacist

## 2024-03-29 DIAGNOSIS — Z7901 Long term (current) use of anticoagulants: Secondary | ICD-10-CM

## 2024-03-29 DIAGNOSIS — Z5181 Encounter for therapeutic drug level monitoring: Secondary | ICD-10-CM

## 2024-03-29 DIAGNOSIS — Z952 Presence of prosthetic heart valve: Secondary | ICD-10-CM

## 2024-03-29 LAB — POCT INR: INR: 2.1 (ref 2.0–3.0)

## 2024-03-29 NOTE — Patient Instructions (Signed)
 Description   Spoke with IIlene, LPN at [facility] and and advised taking 2 tablets daily except 3 tablets on Sundays.  Recheck INR in 1 week. Patient at [facility]: Phone [phone]. Fax: [phone]. Call our office if you have any problems or concerns [phone]

## 2024-03-29 NOTE — Progress Notes (Signed)
Please see anticoagulation encounter.

## 2024-04-04 ENCOUNTER — Ambulatory Visit (INDEPENDENT_AMBULATORY_CARE_PROVIDER_SITE_OTHER): Payer: Self-pay

## 2024-04-04 DIAGNOSIS — Z5181 Encounter for therapeutic drug level monitoring: Secondary | ICD-10-CM

## 2024-04-04 DIAGNOSIS — Z79899 Other long term (current) drug therapy: Secondary | ICD-10-CM | POA: Diagnosis not present

## 2024-04-04 DIAGNOSIS — R791 Abnormal coagulation profile: Secondary | ICD-10-CM | POA: Diagnosis not present

## 2024-04-04 LAB — PROTIME-INR: INR: 2.37 — AB (ref 0.80–1.20)

## 2024-04-04 NOTE — Progress Notes (Signed)
Please see anticoagulation encounter.

## 2024-04-04 NOTE — Patient Instructions (Signed)
 Description   Spoke with Reena at [facility] and and advised to continue taking 2 tablets daily except 3 tablets on Sundays. Recheck INR in 2 weeks. Patient at [facility]: Phone [phone]. Fax: [phone]. Call our office if you have any problems or concerns [phone]

## 2024-04-15 DIAGNOSIS — F411 Generalized anxiety disorder: Secondary | ICD-10-CM | POA: Diagnosis not present

## 2024-04-15 DIAGNOSIS — F0390 Unspecified dementia without behavioral disturbance: Secondary | ICD-10-CM | POA: Diagnosis not present

## 2024-04-15 DIAGNOSIS — F39 Unspecified mood [affective] disorder: Secondary | ICD-10-CM | POA: Diagnosis not present

## 2024-04-15 DIAGNOSIS — I480 Paroxysmal atrial fibrillation: Secondary | ICD-10-CM | POA: Diagnosis not present

## 2024-04-18 ENCOUNTER — Ambulatory Visit (INDEPENDENT_AMBULATORY_CARE_PROVIDER_SITE_OTHER): Payer: Self-pay

## 2024-04-18 DIAGNOSIS — Z79899 Other long term (current) drug therapy: Secondary | ICD-10-CM | POA: Diagnosis not present

## 2024-04-18 DIAGNOSIS — Z5181 Encounter for therapeutic drug level monitoring: Secondary | ICD-10-CM

## 2024-04-18 DIAGNOSIS — Z7901 Long term (current) use of anticoagulants: Secondary | ICD-10-CM | POA: Diagnosis not present

## 2024-04-18 LAB — PROTIME-INR: INR: 2.55 — AB (ref 0.80–1.20)

## 2024-04-18 NOTE — Patient Instructions (Signed)
 Description   Spoke with [name] at [facility] and and advised to continue taking 2 tablets daily except 3 tablets on Sundays. Recheck INR in 2 weeks. Patient at [facility]: Phone [phone]. Fax: [phone]. Call our office if you have any problems or concerns [phone]

## 2024-04-18 NOTE — Progress Notes (Signed)
 INR 2.55; Please see anticoagulation encounter

## 2024-04-20 ENCOUNTER — Encounter (HOSPITAL_COMMUNITY): Payer: Self-pay

## 2024-04-20 ENCOUNTER — Emergency Department (HOSPITAL_COMMUNITY)

## 2024-04-20 ENCOUNTER — Other Ambulatory Visit: Payer: Self-pay

## 2024-04-20 ENCOUNTER — Emergency Department (HOSPITAL_COMMUNITY)
Admission: EM | Admit: 2024-04-20 | Discharge: 2024-04-21 | Disposition: A | Discharge status: Home / Self Care | Attending: Emergency Medicine | Admitting: Emergency Medicine

## 2024-04-20 DIAGNOSIS — S0990XA Unspecified injury of head, initial encounter: Secondary | ICD-10-CM | POA: Diagnosis not present

## 2024-04-20 DIAGNOSIS — D649 Anemia, unspecified: Secondary | ICD-10-CM | POA: Diagnosis not present

## 2024-04-20 DIAGNOSIS — I10 Essential (primary) hypertension: Secondary | ICD-10-CM | POA: Diagnosis not present

## 2024-04-20 DIAGNOSIS — I2510 Atherosclerotic heart disease of native coronary artery without angina pectoris: Secondary | ICD-10-CM | POA: Diagnosis not present

## 2024-04-20 DIAGNOSIS — Z7901 Long term (current) use of anticoagulants: Secondary | ICD-10-CM | POA: Diagnosis not present

## 2024-04-20 DIAGNOSIS — I509 Heart failure, unspecified: Secondary | ICD-10-CM | POA: Diagnosis not present

## 2024-04-20 DIAGNOSIS — M25562 Pain in left knee: Secondary | ICD-10-CM | POA: Diagnosis present

## 2024-04-20 DIAGNOSIS — J323 Chronic sphenoidal sinusitis: Secondary | ICD-10-CM | POA: Diagnosis not present

## 2024-04-20 DIAGNOSIS — S80919A Unspecified superficial injury of unspecified knee, initial encounter: Secondary | ICD-10-CM | POA: Diagnosis not present

## 2024-04-20 DIAGNOSIS — Z79899 Other long term (current) drug therapy: Secondary | ICD-10-CM | POA: Diagnosis not present

## 2024-04-20 DIAGNOSIS — S8992XA Unspecified injury of left lower leg, initial encounter: Secondary | ICD-10-CM | POA: Diagnosis not present

## 2024-04-20 DIAGNOSIS — M85862 Other specified disorders of bone density and structure, left lower leg: Secondary | ICD-10-CM | POA: Diagnosis not present

## 2024-04-20 DIAGNOSIS — M4802 Spinal stenosis, cervical region: Secondary | ICD-10-CM | POA: Diagnosis not present

## 2024-04-20 DIAGNOSIS — N183 Chronic kidney disease, stage 3 unspecified: Secondary | ICD-10-CM | POA: Diagnosis not present

## 2024-04-20 DIAGNOSIS — I6523 Occlusion and stenosis of bilateral carotid arteries: Secondary | ICD-10-CM | POA: Diagnosis not present

## 2024-04-20 DIAGNOSIS — M25552 Pain in left hip: Secondary | ICD-10-CM | POA: Diagnosis not present

## 2024-04-20 DIAGNOSIS — M4312 Spondylolisthesis, cervical region: Secondary | ICD-10-CM | POA: Diagnosis not present

## 2024-04-20 DIAGNOSIS — W07XXXA Fall from chair, initial encounter: Secondary | ICD-10-CM | POA: Diagnosis not present

## 2024-04-20 DIAGNOSIS — Z0389 Encounter for observation for other suspected diseases and conditions ruled out: Secondary | ICD-10-CM | POA: Diagnosis not present

## 2024-04-20 DIAGNOSIS — I130 Hypertensive heart and chronic kidney disease with heart failure and stage 1 through stage 4 chronic kidney disease, or unspecified chronic kidney disease: Secondary | ICD-10-CM | POA: Diagnosis not present

## 2024-04-20 DIAGNOSIS — W19XXXA Unspecified fall, initial encounter: Secondary | ICD-10-CM | POA: Diagnosis not present

## 2024-04-20 DIAGNOSIS — I959 Hypotension, unspecified: Secondary | ICD-10-CM | POA: Diagnosis not present

## 2024-04-20 DIAGNOSIS — S199XXA Unspecified injury of neck, initial encounter: Secondary | ICD-10-CM | POA: Diagnosis not present

## 2024-04-20 DIAGNOSIS — E1122 Type 2 diabetes mellitus with diabetic chronic kidney disease: Secondary | ICD-10-CM | POA: Diagnosis not present

## 2024-04-20 LAB — BASIC METABOLIC PANEL WITH GFR
Anion gap: 11 (ref 5–15)
BUN: 36 mg/dL — ABNORMAL HIGH (ref 8–23)
CO2: 25 mmol/L (ref 22–32)
Calcium: 8.9 mg/dL (ref 8.9–10.3)
Chloride: 104 mmol/L (ref 98–111)
Creatinine, Ser: 1.64 mg/dL — ABNORMAL HIGH (ref 0.44–1.00)
GFR, Estimated: 30 mL/min — ABNORMAL LOW (ref >60)
Glucose, Bld: 156 mg/dL — ABNORMAL HIGH (ref 70–99)
Potassium: 3.3 mmol/L — ABNORMAL LOW (ref 3.5–5.1)
Sodium: 140 mmol/L (ref 135–145)

## 2024-04-20 LAB — CBC
HCT: 35.3 % — ABNORMAL LOW (ref 36.0–46.0)
Hemoglobin: 11.4 g/dL — ABNORMAL LOW (ref 12.0–15.0)
MCH: 29.2 pg (ref 26.0–34.0)
MCHC: 32.3 g/dL (ref 30.0–36.0)
MCV: 90.3 fL (ref 80.0–100.0)
Platelets: 203 K/uL (ref 150–400)
RBC: 3.91 MIL/uL (ref 3.87–5.11)
RDW: 14.9 % (ref 11.5–15.5)
WBC: 6.6 K/uL (ref 4.0–10.5)
nRBC: 0 % (ref 0.0–0.2)

## 2024-04-20 NOTE — ED Triage Notes (Addendum)
 Patient BIB EMS from [facility] for evaluation after a fall. Patient alert and oriented x1 at baseline. EMS report fall was unwitnessed. Patient reports left side knee and hip pain. Patient reports she did not hit head. EMS report some shortening and rotation of the left leg. Patient is on blood thinners.

## 2024-04-20 NOTE — ED Provider Notes (Incomplete)
 [place] [facility] AT [facility] Provider Note   CSN: [id number] Arrival date & time: 04/20/24  2220     Patient presents with: [name] is a 89 y.o. female with history of anemia, coronary artery disease, CHF, CKD stage III, chronic lower GI bleed, heart murmur, history of gout, hypertension, type 2 diabetes, paroxysmal A-fib on anticoagulation.  Patient presents to ED for evaluation of fall.  Patient reports that she was at [facility] this evening.  Reports that she was stretching to attempt to close her blinds when she fell out of the chair she was sitting in.  She reports that she landed on her left knee and left hip.  She complains of pain in her left knee but denies any pain in her left hip.  She has shortening rotation of her left hip.  She states she is unsure if she hit her head.  She denies loss of consciousness, chest pain or shortness of breath prior to the fall.  She denies any neck pain, chest pain, shortness of breath, nausea, vomiting.  Endorsing left knee pain.  Arrives with right greater than left lower extremity edema patient husband at bedside reports this is baseline.    Fall       Prior to Admission medications   Medication Sig Start Date End Date Taking? Authorizing Provider  albuterol  (PROVENTIL ) (2.5 MG/3ML) 0.083% nebulizer solution Take 3 mLs (2.5 mg total) by nebulization every 4 (four) hours as needed for wheezing or shortness of breath. Patient not taking: Reported on 01/22/2024 09/02/23   Vann, Jessica U, DO  atorvastatin  (LIPITOR ) 40 MG tablet Take 1 tablet (40 mg total) by mouth daily. Patient taking differently: Take 40 mg by mouth at bedtime. 10/31/22   Dann Candyce RAMAN, MD  Bevacizumab  (AVASTIN  IV) Inject 1 Dose into the vein every 8 (eight) weeks. Patient not taking: Reported on 01/22/2024    [provider]  cephALEXin  (KEFLEX ) 500 MG capsule Take 2,000 mg by mouth as needed. As needed for dental  prophylaxis Patient not taking: Reported on 01/22/2024    [provider]  clonazePAM  (KLONOPIN ) 0.5 MG tablet Take 1 tablet (0.5 mg total) by mouth 2 (two) times daily as needed for anxiety. 09/02/23   Vann, Jessica U, DO  Emollient (BAG BALM) OINT Apply 1 Application topically daily. Apply to bilateral lower extremities for dry, scaly skin    [provider]  ezetimibe  (ZETIA ) 10 MG tablet Take 1 tablet by mouth once daily Patient taking differently: Take 10 mg by mouth at bedtime. 02/03/22   Dann Candyce RAMAN, MD  FeFum-FePo-FA-B Cmp-C-Zn-Mn-Cu (SE-TAN PLUS ) 162-115.2-1 MG CAPS Take 1 capsule by mouth in the morning and at bedtime.    [provider]  folic acid  (FOLVITE ) 400 MCG tablet Take 400 mcg by mouth in the morning.    [provider]  guaiFENesin  (MUCINEX ) 600 MG 12 hr tablet Take 1 tablet (600 mg total) by mouth 2 (two) times daily. Patient not taking: Reported on 01/22/2024 09/02/23   Vann, Jessica U, DO  isosorbide  mononitrate (IMDUR ) 60 MG 24 hr tablet Take 1 tablet by mouth once daily Patient taking differently: 60 mg daily. 05/07/22   Dann Candyce RAMAN, MD  levothyroxine  (SYNTHROID ) 88 MCG tablet Take 88 mcg by mouth at bedtime. 08/16/23   [provider]  memantine (NAMENDA) 5 MG tablet Take 5 mg by mouth 2 (two) times daily. 07/29/23   [provider]  metoprolol  tartrate (LOPRESSOR ) 25 MG tablet Take 0.5 tablets (12.5 mg total) by mouth 2 (two) times daily. 10/08/23 01/06/24  Kate Lonni CROME, MD  Multiple Vitamin (MULTIVITAMIN WITH MINERALS) TABS Take 1 tablet by mouth daily with breakfast.    [provider]  Multiple Vitamins-Minerals (PRESERVISION AREDS 2 PO) Take 1 capsule by mouth 2 (two) times daily.    [provider]  nitroGLYCERIN  (NITROSTAT ) 0.4 MG SL tablet DISSOLVE ONE TABLET UNDER THE TONGUE EVERY 5 MINUTES AS NEEDED FOR CHEST PAIN.  DO NOT EXCEED A TOTAL OF 3 DOSES IN 15 MINUTES Patient  taking differently: Place 0.4 mg under the tongue every 5 (five) minutes x 3 doses as needed for chest pain. 02/14/20   Dann Candyce RAMAN, MD  Nutritional Supplements (BOOST GLUCOSE CONTROL PO) Take 1 Bottle by mouth daily.    [provider]  Omega-3 Fatty Acids (FISH OIL PO) Take 700 mg by mouth 2 (two) times daily.    [provider]  oxyCODONE  (OXY IR/ROXICODONE ) 5 MG immediate release tablet Take 1 tablet (5 mg total) by mouth every 6 (six) hours as needed for moderate pain (pain score 4-6) or severe pain (pain score 7-10). 09/02/23   Vann, Jessica U, DO  pantoprazole  (PROTONIX ) 40 MG tablet Take 1 tablet (40 mg total) by mouth 2 (two) times daily. 09/02/23   Vann, Jessica U, DO  polyethylene glycol (MIRALAX  / GLYCOLAX ) 17 g packet Take 17 g by mouth daily as needed for mild constipation. 09/02/23   Vann, Jessica U, DO  potassium chloride  (MICRO-K ) 10 MEQ CR capsule Take 20 mEq by mouth daily. 08/15/23   [provider]  risperiDONE (RISPERDAL) 0.25 MG tablet Take 0.25 mg by mouth 2 (two) times daily.    [provider]  sertraline (ZOLOFT) 25 MG tablet Take 25 mg by mouth daily. 08/14/23   [provider]  torsemide (DEMADEX) 20 MG tablet Take 20 mg by mouth 2 (two) times daily.    [provider]  TYLENOL  500 MG tablet Take 500-1,000 mg by mouth every 6 (six) hours as needed for mild pain or headache. Patient not taking: Reported on 01/22/2024    [provider]  warfarin (COUMADIN ) 1 MG tablet Take 0.5 tablets (0.5 mg total) by mouth daily at 4 PM. Give 0.5mg  tablet daily 09/02/23   Vann, Jessica U, DO    Allergies: Capzasin [capsaicin], Darvon [propoxyphene], Lisinopril, Septra [sulfamethoxazole-trimethoprim], Voltaren [diclofenac], Warfarin and related, Tobrex [tobramycin], Trazodone  and nefazodone, Zolpidem  tartrate, and Penicillins    Review of Systems  Updated Vital Signs BP (!) 132/51   Pulse 90   Temp 97.6 F (36.4  C) (Oral)   Resp 16   SpO2 100%   Physical Exam  (all labs ordered are listed, but only abnormal results are displayed) Labs Reviewed - No data to display  EKG: None  Radiology: No results found.  {Document cardiac monitor, telemetry assessment procedure when appropriate:32947} Procedures   Medications Ordered in the ED - No data to display    {Click here for ABCD2, HEART and other calculators REFRESH Note before signing:1}                              Medical Decision Making  ***  {Document critical care time when appropriate  Document review of labs and clinical decision tools ie [id number], etc  Document your independent review of radiology images and any outside records  Document your discussion with family members, caretakers and with consultants  Document social determinants of health affecting pt's care  Document your decision making why or why not admission, treatments were needed:32947:::1}   Final diagnoses:  None    ED Discharge Orders     None

## 2024-04-20 NOTE — ED Provider Notes (Addendum)
 [place] EMERGENCY DEPARTMENT AT [facility] Provider Note   CSN: [id number] Arrival date & time: 04/20/24  2220     Patient presents with: [name] is a 89 y.o. female with history of anemia, coronary artery disease, CHF, CKD stage III, chronic lower GI bleed, heart murmur, history of gout, hypertension, type 2 diabetes, paroxysmal A-fib on anticoagulation.  Patient presents to ED for evaluation of fall.  Patient reports that she was at [facility] this evening.  Reports that she was stretching to attempt to close her blinds when she fell out of the chair she was sitting in.  She reports that she landed on her left knee and left hip.  She complains of pain in her left knee but denies any pain in her left hip.  She has shortening rotation of her left hip.  She states she is unsure if she hit her head.  She denies loss of consciousness, chest pain or shortness of breath prior to the fall.  She denies any neck pain, chest pain, shortness of breath, nausea, vomiting.  Endorsing left knee pain.  Arrives with right greater than left lower extremity edema patient husband at bedside reports this is baseline.    Fall Pertinent negatives include no chest pain and no shortness of breath.       Prior to Admission medications   Medication Sig Start Date End Date Taking? Authorizing Provider  albuterol  (PROVENTIL ) (2.5 MG/3ML) 0.083% nebulizer solution Take 3 mLs (2.5 mg total) by nebulization every 4 (four) hours as needed for wheezing or shortness of breath. Patient not taking: Reported on 01/22/2024 09/02/23   Vann, Jessica U, DO  atorvastatin  (LIPITOR ) 40 MG tablet Take 1 tablet (40 mg total) by mouth daily. Patient taking differently: Take 40 mg by mouth at bedtime. 10/31/22   Dann Candyce RAMAN, MD  Bevacizumab  (AVASTIN  IV) Inject 1 Dose into the vein every 8 (eight) weeks. Patient not taking: Reported on 01/22/2024    [provider]  cephALEXin  (KEFLEX )  500 MG capsule Take 2,000 mg by mouth as needed. As needed for dental prophylaxis Patient not taking: Reported on 01/22/2024    [provider]  clonazePAM  (KLONOPIN ) 0.5 MG tablet Take 1 tablet (0.5 mg total) by mouth 2 (two) times daily as needed for anxiety. 09/02/23   Vann, Jessica U, DO  Emollient (BAG BALM) OINT Apply 1 Application topically daily. Apply to bilateral lower extremities for dry, scaly skin    [provider]  ezetimibe  (ZETIA ) 10 MG tablet Take 1 tablet by mouth once daily Patient taking differently: Take 10 mg by mouth at bedtime. 02/03/22   Dann Candyce RAMAN, MD  FeFum-FePo-FA-B Cmp-C-Zn-Mn-Cu (SE-TAN PLUS ) 162-115.2-1 MG CAPS Take 1 capsule by mouth in the morning and at bedtime.    [provider]  folic acid  (FOLVITE ) 400 MCG tablet Take 400 mcg by mouth in the morning.    [provider]  guaiFENesin  (MUCINEX ) 600 MG 12 hr tablet Take 1 tablet (600 mg total) by mouth 2 (two) times daily. Patient not taking: Reported on 01/22/2024 09/02/23   Vann, Jessica U, DO  isosorbide  mononitrate (IMDUR ) 60 MG 24 hr tablet Take 1 tablet by mouth once daily Patient taking differently: 60 mg daily. 05/07/22   Dann Candyce RAMAN, MD  levothyroxine  (SYNTHROID ) 88 MCG tablet Take 88 mcg by mouth at bedtime. 08/16/23   [provider]  memantine (NAMENDA) 5 MG tablet Take 5 mg by  mouth 2 (two) times daily. 07/29/23   [provider]  metoprolol  tartrate (LOPRESSOR ) 25 MG tablet Take 0.5 tablets (12.5 mg total) by mouth 2 (two) times daily. 10/08/23 01/06/24  Kate Lonni CROME, MD  Multiple Vitamin (MULTIVITAMIN WITH MINERALS) TABS Take 1 tablet by mouth daily with breakfast.    [provider]  Multiple Vitamins-Minerals (PRESERVISION AREDS 2 PO) Take 1 capsule by mouth 2 (two) times daily.    [provider]  nitroGLYCERIN  (NITROSTAT ) 0.4 MG SL tablet DISSOLVE ONE TABLET UNDER THE TONGUE EVERY 5 MINUTES AS NEEDED FOR  CHEST PAIN.  DO NOT EXCEED A TOTAL OF 3 DOSES IN 15 MINUTES Patient taking differently: Place 0.4 mg under the tongue every 5 (five) minutes x 3 doses as needed for chest pain. 02/14/20   Dann Candyce RAMAN, MD  Nutritional Supplements (BOOST GLUCOSE CONTROL PO) Take 1 Bottle by mouth daily.    [provider]  Omega-3 Fatty Acids (FISH OIL PO) Take 700 mg by mouth 2 (two) times daily.    [provider]  oxyCODONE  (OXY IR/ROXICODONE ) 5 MG immediate release tablet Take 1 tablet (5 mg total) by mouth every 6 (six) hours as needed for moderate pain (pain score 4-6) or severe pain (pain score 7-10). 09/02/23   Vann, Jessica U, DO  pantoprazole  (PROTONIX ) 40 MG tablet Take 1 tablet (40 mg total) by mouth 2 (two) times daily. 09/02/23   Vann, Jessica U, DO  polyethylene glycol (MIRALAX  / GLYCOLAX ) 17 g packet Take 17 g by mouth daily as needed for mild constipation. 09/02/23   Vann, Jessica U, DO  potassium chloride  (MICRO-K ) 10 MEQ CR capsule Take 20 mEq by mouth daily. 08/15/23   [provider]  risperiDONE (RISPERDAL) 0.25 MG tablet Take 0.25 mg by mouth 2 (two) times daily.    [provider]  sertraline (ZOLOFT) 25 MG tablet Take 25 mg by mouth daily. 08/14/23   [provider]  torsemide (DEMADEX) 20 MG tablet Take 20 mg by mouth 2 (two) times daily.    [provider]  TYLENOL  500 MG tablet Take 500-1,000 mg by mouth every 6 (six) hours as needed for mild pain or headache. Patient not taking: Reported on 01/22/2024    [provider]  warfarin (COUMADIN ) 1 MG tablet Take 0.5 tablets (0.5 mg total) by mouth daily at 4 PM. Give 0.5mg  tablet daily 09/02/23   Vann, Jessica U, DO    Allergies: Capzasin [capsaicin], Darvon [propoxyphene], Lisinopril, Septra [sulfamethoxazole-trimethoprim], Voltaren [diclofenac], Warfarin and related, Tobrex [tobramycin], Trazodone  and nefazodone, Zolpidem  tartrate, and Penicillins    Review of Systems   Respiratory:  Negative for shortness of breath.   Cardiovascular:  Negative for chest pain.  Musculoskeletal:  Positive for arthralgias and neck pain.  All other systems reviewed and are negative.   Updated Vital Signs BP 122/64   Pulse 89   Temp 97.6 F (36.4 C) (Oral)   Resp 18   SpO2 99%   Physical Exam Vitals and nursing note reviewed.  Constitutional:      General: She is not in acute distress.    Appearance: She is well-developed.  HENT:     Head: Normocephalic and atraumatic.  Eyes:     Conjunctiva/sclera: Conjunctivae normal.  Cardiovascular:     Rate and Rhythm: Normal rate and regular rhythm.     Heart sounds: No murmur heard. Pulmonary:     Effort: Pulmonary effort is normal. No respiratory distress.  Breath sounds: Normal breath sounds.  Abdominal:     Palpations: Abdomen is soft.     Tenderness: There is no abdominal tenderness.  Musculoskeletal:        General: No swelling.     Cervical back: Neck supple.     Right lower leg: Edema present.     Comments: Slight shortening, no rotation, of left lower extremity.  Tenderness to left external hip.  Tenderness throughout left knee.  No deformity to left knee.  Full ROM of left knee.  No swelling.  Right lower extremity edema present however patient husband reports this is baseline.  Skin:    General: Skin is warm and dry.     Capillary Refill: Capillary refill takes less than 2 seconds.  Neurological:     Mental Status: She is alert and oriented to person, place, and time. Mental status is at baseline.  Psychiatric:        Mood and Affect: Mood normal.     (all labs ordered are listed, but only abnormal results are displayed) Labs Reviewed  CBC - Abnormal; Notable for the following components:      Result Value   Hemoglobin 11.4 (*)    HCT 35.3 (*)    All other components within normal limits  BASIC METABOLIC PANEL WITH GFR - Abnormal; Notable for the following components:   Potassium 3.3 (*)     Glucose, Bld 156 (*)    BUN 36 (*)    Creatinine, Ser 1.64 (*)    GFR, Estimated 30 (*)    All other components within normal limits    EKG: EKG Interpretation Date/Time:  Wednesday April 20 2024 23:38:25 EDT Ventricular Rate:  95 PR Interval:    QRS Duration:  128 QT Interval:  394 QTC Calculation: 496 R Axis:   -62  Text Interpretation: Atrial fibrillation Left bundle branch block No significant change since last tracing Confirmed by Haze Lonni PARAS [phone]) on 04/21/2024 12:37:41 AM  Radiology: CT Knee Left Wo Contrast Result Date: 04/21/2024 CLINICAL DATA:  Knee trauma, occult fracture suspected, xray done EXAM: CT OF THE LEFT KNEE WITHOUT CONTRAST TECHNIQUE: Multidetector CT imaging of the left knee was performed according to the standard protocol. Multiplanar CT image reconstructions were also generated. RADIATION DOSE REDUCTION: This exam was performed according to the departmental dose-optimization program which includes automated exposure control, adjustment of the mA and/or kV according to patient size and/or use of iterative reconstruction technique. COMPARISON:  Plain films 04/20/2024 FINDINGS: Bones/Joint/Cartilage Diffuse osteopenia. No acute bony abnormality. Specifically, no fracture, subluxation, or dislocation. No joint effusion. Ligaments Suboptimally assessed by CT. Muscles and Tendons Negative Soft tissues Diffuse vascular calcifications noted.  Otherwise negative. IMPRESSION: Diffuse osteopenia.  No acute bony abnormality. Electronically Signed   By: Franky Crease M.D.   On: 04/21/2024 01:49   CT Hip Left Wo Contrast Result Date: 04/21/2024 CLINICAL DATA:  Recent fall with left hip pain, initial encounter EXAM: CT OF THE LEFT HIP WITHOUT CONTRAST TECHNIQUE: Multidetector CT imaging of the left hip was performed according to the standard protocol. Multiplanar CT image reconstructions were also generated. RADIATION DOSE REDUCTION: This exam was performed according to  the departmental dose-optimization program which includes automated exposure control, adjustment of the mA and/or kV according to patient size and/or use of iterative reconstruction technique. COMPARISON:  Plain film from the previous day. FINDINGS: Bones/Joint/Cartilage Degenerative changes of the lower lumbar spine are seen. Visualized sacrum is within normal limits. No acute fracture or  dislocation of the proximal left femur is noted. Very mild degenerative changes of the hip joint are seen. Ligaments Suboptimally assessed by CT. Muscles and Tendons Surrounding musculature appears within normal limits. Soft tissues Mild soft tissue swelling is noted laterally consistent with the recent injury. The visualized pelvic structures are within normal limits. IMPRESSION: No acute fracture noted. Electronically Signed   By: Oneil Devonshire M.D.   On: 04/21/2024 00:56   DG Knee Complete 4 Views Left Result Date: 04/20/2024 CLINICAL DATA:  Left knee pain after a fall. EXAM: LEFT KNEE - COMPLETE 4+ VIEW COMPARISON:  03/09/2022 FINDINGS: Somewhat oblique positioning of the left knee limits evaluation particularly on the lateral view. As visualized, there appears to be diffuse bone demineralization. No evidence of acute fracture or dislocation. No focal bone lesion or bone destruction. No obvious effusion. Prominent vascular calcifications in the soft tissues. IMPRESSION: No acute bony abnormalities. Electronically Signed   By: Elsie Gravely M.D.   On: 04/20/2024 23:38   DG Hip Unilat W or Wo Pelvis 2-3 Views Left Result Date: 04/20/2024 CLINICAL DATA:  Shortening and rotation of the left lower extremity after a fall. EXAM: DG HIP (WITH OR WITHOUT PELVIS) 2-3V LEFT COMPARISON:  None Available. FINDINGS: Pelvis and left hip appear intact. No acute fracture or dislocation. No focal bone lesion or bone destruction. SI joints and symphysis pubis are not displaced. Prominent vascular calcifications. IMPRESSION: No evidence of  acute fracture or dislocation of the left hip. Electronically Signed   By: Elsie Gravely M.D.   On: 04/20/2024 23:37   CT Head Wo Contrast Result Date: 04/20/2024 CLINICAL DATA:  Head trauma, moderate-severe patient on blood thinners,; Neck trauma (Age >= 65y) EXAM: CT HEAD WITHOUT CONTRAST CT CERVICAL SPINE WITHOUT CONTRAST TECHNIQUE: Multidetector CT imaging of the head and cervical spine was performed following the standard protocol without intravenous contrast. Multiplanar CT image reconstructions of the cervical spine were also generated. RADIATION DOSE REDUCTION: This exam was performed according to the departmental dose-optimization program which includes automated exposure control, adjustment of the mA and/or kV according to patient size and/or use of iterative reconstruction technique. COMPARISON:  10/05/2022 FINDINGS: CT HEAD FINDINGS Brain: Normal anatomic configuration. Moderate parenchymal volume loss is commensurate with the patient's age. Mild periventricular white matter changes are present likely reflecting the sequela of small vessel ischemia. Remote bilateral parietal cortical infarcts are identified. Extensive atherosclerotic calcification within the no abnormal intra or extra-axial mass lesion or fluid collection. No abnormal mass effect or midline shift. No evidence of acute intracranial hemorrhage or infarct. Ventricular size is normal. Cerebellum unremarkable. Vascular: No asymmetric hyperdense vasculature at the skull base. Terminal vertebral arteries and carotid siphons Skull: Intact Sinuses/Orbits: Dense consolidation within the left sphenoid sinus with associated wall thickening and sclerosis in keeping with changes of chronic sinusitis. Remaining paranasal sinuses are clear. Orbits are unremarkable Other: Mastoid air cells and middle ear cavities are clear. CT CERVICAL SPINE FINDINGS Alignment: Interval development of 3 mm anterolisthesis C3-4 related to progressive asymmetric left  facet arthrosis. Otherwise normal cervical alignment. Skull base and vertebrae: Radial cervical alignment is. The atlantodental interval is not widened. No acute fracture of the cervical spine. Vertebral body height is preserved. Soft tissues and spinal canal: No prevertebral fluid or swelling. No visible canal hematoma. Disc levels: Disc space narrowing and endplate remodeling of C4-C7 is present in keeping with changes of advanced degenerative disc disease. Anterolisthesis at C3-4 results in moderate central canal stenosis with an AP diameter  of the spinal canal of approximately 6 mm and flattening of the thecal sac. Prevertebral soft tissues are not thickened on sagittal reformats. Uncovertebral and facet arthrosis results in severe left neuroforaminal narrowing at C3-4 and moderate bilateral neuroforaminal narrowing at C5-6, right greater than left Upper chest: Negative. Other: Advanced atherosclerotic calcification is noted within the carotid bifurcations bilaterally IMPRESSION: 1. No acute intracranial process. 2. Remote bilateral parietal cortical infarcts. 3. No acute fracture of the cervical spine. 4. Interval development of 3 mm anterolisthesis C3-4 related to progressive asymmetric left facet arthrosis 5. Advanced degenerative disc disease of C4-C7 with moderate central canal stenosis at C3-4. Severe left neuroforaminal narrowing at C3-4 and moderate bilateral neuroforaminal narrowing at C5-6, right greater than left. 6. Advanced atherosclerotic calcification within the carotid bifurcations bilaterally. 7. Chronic left sphenoid sinusitis. Electronically Signed   By: Dorethia Molt M.D.   On: 04/20/2024 23:33   CT Cervical Spine Wo Contrast Result Date: 04/20/2024 CLINICAL DATA:  Head trauma, moderate-severe patient on blood thinners,; Neck trauma (Age >= 65y) EXAM: CT HEAD WITHOUT CONTRAST CT CERVICAL SPINE WITHOUT CONTRAST TECHNIQUE: Multidetector CT imaging of the head and cervical spine was  performed following the standard protocol without intravenous contrast. Multiplanar CT image reconstructions of the cervical spine were also generated. RADIATION DOSE REDUCTION: This exam was performed according to the departmental dose-optimization program which includes automated exposure control, adjustment of the mA and/or kV according to patient size and/or use of iterative reconstruction technique. COMPARISON:  10/05/2022 FINDINGS: CT HEAD FINDINGS Brain: Normal anatomic configuration. Moderate parenchymal volume loss is commensurate with the patient's age. Mild periventricular white matter changes are present likely reflecting the sequela of small vessel ischemia. Remote bilateral parietal cortical infarcts are identified. Extensive atherosclerotic calcification within the no abnormal intra or extra-axial mass lesion or fluid collection. No abnormal mass effect or midline shift. No evidence of acute intracranial hemorrhage or infarct. Ventricular size is normal. Cerebellum unremarkable. Vascular: No asymmetric hyperdense vasculature at the skull base. Terminal vertebral arteries and carotid siphons Skull: Intact Sinuses/Orbits: Dense consolidation within the left sphenoid sinus with associated wall thickening and sclerosis in keeping with changes of chronic sinusitis. Remaining paranasal sinuses are clear. Orbits are unremarkable Other: Mastoid air cells and middle ear cavities are clear. CT CERVICAL SPINE FINDINGS Alignment: Interval development of 3 mm anterolisthesis C3-4 related to progressive asymmetric left facet arthrosis. Otherwise normal cervical alignment. Skull base and vertebrae: Radial cervical alignment is. The atlantodental interval is not widened. No acute fracture of the cervical spine. Vertebral body height is preserved. Soft tissues and spinal canal: No prevertebral fluid or swelling. No visible canal hematoma. Disc levels: Disc space narrowing and endplate remodeling of C4-C7 is present in  keeping with changes of advanced degenerative disc disease. Anterolisthesis at C3-4 results in moderate central canal stenosis with an AP diameter of the spinal canal of approximately 6 mm and flattening of the thecal sac. Prevertebral soft tissues are not thickened on sagittal reformats. Uncovertebral and facet arthrosis results in severe left neuroforaminal narrowing at C3-4 and moderate bilateral neuroforaminal narrowing at C5-6, right greater than left Upper chest: Negative. Other: Advanced atherosclerotic calcification is noted within the carotid bifurcations bilaterally IMPRESSION: 1. No acute intracranial process. 2. Remote bilateral parietal cortical infarcts. 3. No acute fracture of the cervical spine. 4. Interval development of 3 mm anterolisthesis C3-4 related to progressive asymmetric left facet arthrosis 5. Advanced degenerative disc disease of C4-C7 with moderate central canal stenosis at C3-4. Severe left neuroforaminal narrowing  at C3-4 and moderate bilateral neuroforaminal narrowing at C5-6, right greater than left. 6. Advanced atherosclerotic calcification within the carotid bifurcations bilaterally. 7. Chronic left sphenoid sinusitis. Electronically Signed   By: Dorethia Molt M.D.   On: 04/20/2024 23:33    Procedures   Medications Ordered in the ED  potassium chloride  (KLOR-CON ) packet 40 mEq (has no administration in time range)     Medical Decision Making Amount and/or Complexity of Data Reviewed Labs: ordered. Radiology: ordered.  Risk OTC drugs. Prescription drug management.   89 year old female presents after fall.  On exam patient is afebrile and nontachycardic.  Her lung sounds are clear bilaterally, she is not hypoxic.  Her abdomen is soft and compressible.  Neurological examination at baseline per husband at bedside.  Patient left hip with lateral tenderness, slight shortening of left lower extremity.  Full ROM of left knee.  Patient arrives in c-collar.  Will  assess with basic labs to include CBC, BMP.  Will collect x-ray imaging of left knee, left hip.  Will collect CT scan of head and neck.  CBC without leukocytosis or anemia.  Metabolic panel with potassium 3.3 repleted with 6M EQ's oral potassium, baseline creatinine.  CT scan of head and neck unremarkable for acute process.  XR imaging of left hip and left knee unremarkable.  There is concern for underlying fracture so CT scan of left hip, left knee were collected.  Patient has diffuse osteopenia so unable to fully assess on plain film imaging of x-ray.  CT scan of left hip and left knee were unremarkable.  At this time, patient workup is unremarkable.  Patient will be discharged home back to her SNF.  Advised to follow-up with PCP.  Return precautions given and she voiced understanding.  Also acknowledged by husband at bedside.  Stable to discharge.    Final diagnoses:  Fall, initial encounter    ED Discharge Orders     None           [name] 04/21/24 0230    Renee Lamar BROCKS, MD 04/25/24 [phone]

## 2024-04-21 ENCOUNTER — Emergency Department (HOSPITAL_COMMUNITY)

## 2024-04-21 DIAGNOSIS — M85862 Other specified disorders of bone density and structure, left lower leg: Secondary | ICD-10-CM | POA: Diagnosis not present

## 2024-04-21 DIAGNOSIS — R531 Weakness: Secondary | ICD-10-CM | POA: Diagnosis not present

## 2024-04-21 DIAGNOSIS — M25562 Pain in left knee: Secondary | ICD-10-CM | POA: Diagnosis not present

## 2024-04-21 DIAGNOSIS — Z7401 Bed confinement status: Secondary | ICD-10-CM | POA: Diagnosis not present

## 2024-04-21 DIAGNOSIS — S8992XA Unspecified injury of left lower leg, initial encounter: Secondary | ICD-10-CM | POA: Diagnosis not present

## 2024-04-21 MED ORDER — POTASSIUM CHLORIDE 20 MEQ PO PACK: 40.0000 meq | PACK | Freq: Every day | ORAL | Status: DC

## 2024-04-21 MED ORDER — ACETAMINOPHEN 325 MG PO TABS
650.0000 mg | ORAL_TABLET | Freq: Once | ORAL | Status: AC
  Administered 2024-04-21: 650 mg via ORAL
  Filled 2024-04-21: qty 2

## 2024-04-21 NOTE — Discharge Instructions (Signed)
 It was a pleasure taking part in your care.  As we discussed, all of your x-rays and CT scans tonight were negative for any kind of acute fracture.  Please have the patient follow-up with her PCP.  Please return to the ED with any new symptoms.

## 2024-04-21 NOTE — ED Notes (Signed)
 [facility] called for transport back to facility

## 2024-04-21 NOTE — ED Notes (Signed)
 Awaiting for [facility] back to [facility].

## 2024-04-22 DIAGNOSIS — H35371 Puckering of macula, right eye: Secondary | ICD-10-CM | POA: Diagnosis not present

## 2024-04-22 DIAGNOSIS — F0390 Unspecified dementia without behavioral disturbance: Secondary | ICD-10-CM | POA: Diagnosis not present

## 2024-04-22 DIAGNOSIS — H353112 Nonexudative age-related macular degeneration, right eye, intermediate dry stage: Secondary | ICD-10-CM | POA: Diagnosis not present

## 2024-04-22 DIAGNOSIS — H43813 Vitreous degeneration, bilateral: Secondary | ICD-10-CM | POA: Diagnosis not present

## 2024-04-22 DIAGNOSIS — E113293 Type 2 diabetes mellitus with mild nonproliferative diabetic retinopathy without macular edema, bilateral: Secondary | ICD-10-CM | POA: Diagnosis not present

## 2024-04-22 DIAGNOSIS — H353221 Exudative age-related macular degeneration, left eye, with active choroidal neovascularization: Secondary | ICD-10-CM | POA: Diagnosis not present

## 2024-04-22 DIAGNOSIS — W19XXXD Unspecified fall, subsequent encounter: Secondary | ICD-10-CM | POA: Diagnosis not present

## 2024-04-22 DIAGNOSIS — R600 Localized edema: Secondary | ICD-10-CM | POA: Diagnosis not present

## 2024-04-22 DIAGNOSIS — R269 Unspecified abnormalities of gait and mobility: Secondary | ICD-10-CM | POA: Diagnosis not present

## 2024-05-02 ENCOUNTER — Ambulatory Visit (INDEPENDENT_AMBULATORY_CARE_PROVIDER_SITE_OTHER): Payer: Self-pay

## 2024-05-02 DIAGNOSIS — Z79899 Other long term (current) drug therapy: Secondary | ICD-10-CM | POA: Diagnosis not present

## 2024-05-02 DIAGNOSIS — Z5181 Encounter for therapeutic drug level monitoring: Secondary | ICD-10-CM

## 2024-05-02 DIAGNOSIS — Z7901 Long term (current) use of anticoagulants: Secondary | ICD-10-CM | POA: Diagnosis not present

## 2024-05-02 LAB — PROTIME-INR: INR: 2.09 — AB (ref 0.80–1.20)

## 2024-05-02 NOTE — Patient Instructions (Signed)
 Description   Spoke with [name] at [facility] and and advised to continue taking 2 tablets daily except 3 tablets on Sundays. Recheck INR in 2 weeks. Patient at [facility]: Phone [phone]. Fax: [phone]. Call our office if you have any problems or concerns [phone]

## 2024-05-02 NOTE — Progress Notes (Signed)
 INR 2.09; Please see anticoagulation encounter

## 2024-05-06 DIAGNOSIS — I2510 Atherosclerotic heart disease of native coronary artery without angina pectoris: Secondary | ICD-10-CM | POA: Diagnosis not present

## 2024-05-06 DIAGNOSIS — F0390 Unspecified dementia without behavioral disturbance: Secondary | ICD-10-CM | POA: Diagnosis not present

## 2024-05-06 DIAGNOSIS — I480 Paroxysmal atrial fibrillation: Secondary | ICD-10-CM | POA: Diagnosis not present

## 2024-05-06 DIAGNOSIS — R269 Unspecified abnormalities of gait and mobility: Secondary | ICD-10-CM | POA: Diagnosis not present

## 2024-05-16 ENCOUNTER — Ambulatory Visit (INDEPENDENT_AMBULATORY_CARE_PROVIDER_SITE_OTHER): Payer: Self-pay | Admitting: Cardiology

## 2024-05-16 DIAGNOSIS — Z7901 Long term (current) use of anticoagulants: Secondary | ICD-10-CM | POA: Diagnosis not present

## 2024-05-16 DIAGNOSIS — Z5181 Encounter for therapeutic drug level monitoring: Secondary | ICD-10-CM

## 2024-05-16 LAB — PROTIME-INR: INR: 1.67 — AB (ref 0.80–1.20)

## 2024-05-16 NOTE — Progress Notes (Signed)
 INR 1.67 Please see anticoagulation encounter Spoke with  [facility] and advised to have patient Take 3 tablets today only then continue taking 2 tablets daily except 3 tablets on Sundays. Recheck INR in 2 weeks. Patient at [facility]: Phone [phone]. Fax: [phone]. Call our office if you have any problems or concerns [phone]

## 2024-05-23 DIAGNOSIS — F03A4 Unspecified dementia, mild, with anxiety: Secondary | ICD-10-CM | POA: Diagnosis not present

## 2024-05-23 DIAGNOSIS — F5101 Primary insomnia: Secondary | ICD-10-CM | POA: Diagnosis not present

## 2024-05-23 DIAGNOSIS — F411 Generalized anxiety disorder: Secondary | ICD-10-CM | POA: Diagnosis not present

## 2024-05-30 ENCOUNTER — Ambulatory Visit (INDEPENDENT_AMBULATORY_CARE_PROVIDER_SITE_OTHER): Payer: Self-pay | Admitting: Cardiovascular Disease

## 2024-05-30 DIAGNOSIS — Z5181 Encounter for therapeutic drug level monitoring: Secondary | ICD-10-CM

## 2024-05-30 DIAGNOSIS — Z7901 Long term (current) use of anticoagulants: Secondary | ICD-10-CM | POA: Diagnosis not present

## 2024-05-30 DIAGNOSIS — Z79899 Other long term (current) drug therapy: Secondary | ICD-10-CM | POA: Diagnosis not present

## 2024-05-30 LAB — PROTIME-INR: INR: 2.39 — AB (ref 0.80–1.20)

## 2024-05-30 NOTE — Progress Notes (Signed)
 INR 2.39 Please see anticoagulation encounter Spoke with Keene-  [facility] and advised to have patient continue taking 2 tablets daily except 3 tablets on Sundays. Recheck INR in 2 weeks. Patient at [facility]: Phone [phone]. Fax: [phone].

## 2024-06-06 DIAGNOSIS — N390 Urinary tract infection, site not specified: Secondary | ICD-10-CM | POA: Diagnosis not present

## 2024-06-06 DIAGNOSIS — N890 Mild vaginal dysplasia: Secondary | ICD-10-CM | POA: Diagnosis not present

## 2024-06-08 DIAGNOSIS — N3090 Cystitis, unspecified without hematuria: Secondary | ICD-10-CM | POA: Diagnosis not present

## 2024-06-08 DIAGNOSIS — B961 Klebsiella pneumoniae [K. pneumoniae] as the cause of diseases classified elsewhere: Secondary | ICD-10-CM | POA: Diagnosis not present

## 2024-06-09 ENCOUNTER — Telehealth: Payer: Self-pay

## 2024-06-09 NOTE — Telephone Encounter (Signed)
 Spoke with Keene from [facility] who stated that patient is on Cipro through 9/22.  I recommended giving her half doses of Warfarin until then.  She verbalized understanding

## 2024-06-13 ENCOUNTER — Ambulatory Visit (INDEPENDENT_AMBULATORY_CARE_PROVIDER_SITE_OTHER): Payer: Self-pay | Admitting: Cardiovascular Disease

## 2024-06-13 DIAGNOSIS — Z5181 Encounter for therapeutic drug level monitoring: Secondary | ICD-10-CM

## 2024-06-13 DIAGNOSIS — Z7901 Long term (current) use of anticoagulants: Secondary | ICD-10-CM

## 2024-06-13 LAB — PROTIME-INR: INR: 1.42 — AB (ref 0.80–1.20)

## 2024-06-13 NOTE — Progress Notes (Signed)
 INR 1.42 Please see anticoagulation encounter Spoke with Keene-  [facility] and advised to have patient take 3 tablets tonight only then continue taking 2 tablets daily except 3 tablets on Sundays. Recheck INR in 2 weeks. Patient at [facility]: Phone [phone]. Fax: [phone]. Call our office if you have any problems or concerns [phone]

## 2024-06-17 DIAGNOSIS — E113293 Type 2 diabetes mellitus with mild nonproliferative diabetic retinopathy without macular edema, bilateral: Secondary | ICD-10-CM | POA: Diagnosis not present

## 2024-06-17 DIAGNOSIS — H353112 Nonexudative age-related macular degeneration, right eye, intermediate dry stage: Secondary | ICD-10-CM | POA: Diagnosis not present

## 2024-06-17 DIAGNOSIS — H35371 Puckering of macula, right eye: Secondary | ICD-10-CM | POA: Diagnosis not present

## 2024-06-17 DIAGNOSIS — H353221 Exudative age-related macular degeneration, left eye, with active choroidal neovascularization: Secondary | ICD-10-CM | POA: Diagnosis not present

## 2024-06-17 DIAGNOSIS — H43813 Vitreous degeneration, bilateral: Secondary | ICD-10-CM | POA: Diagnosis not present

## 2024-06-27 ENCOUNTER — Ambulatory Visit (INDEPENDENT_AMBULATORY_CARE_PROVIDER_SITE_OTHER): Payer: Self-pay | Admitting: Cardiology

## 2024-06-27 DIAGNOSIS — Z79899 Other long term (current) drug therapy: Secondary | ICD-10-CM | POA: Diagnosis not present

## 2024-06-27 DIAGNOSIS — Z5181 Encounter for therapeutic drug level monitoring: Secondary | ICD-10-CM

## 2024-06-27 DIAGNOSIS — Z7901 Long term (current) use of anticoagulants: Secondary | ICD-10-CM

## 2024-06-27 LAB — PROTIME-INR: INR: 2.53 — AB (ref 0.80–1.20)

## 2024-06-27 NOTE — Progress Notes (Signed)
 INR 2.53 Please see anticoagulation encounter Spoke with [facility] and advised to have patient continue taking 2 tablets daily except 3 tablets on Sundays. Recheck INR in 2 weeks. Patient at [facility]: Phone [phone]. Fax: [phone]. Call our office if you have any problems or concerns [phone]

## 2024-06-29 DIAGNOSIS — Z79899 Other long term (current) drug therapy: Secondary | ICD-10-CM | POA: Diagnosis not present

## 2024-06-30 ENCOUNTER — Telehealth: Payer: Self-pay | Admitting: *Deleted

## 2024-06-30 DIAGNOSIS — R059 Cough, unspecified: Secondary | ICD-10-CM | POA: Diagnosis not present

## 2024-06-30 DIAGNOSIS — J811 Chronic pulmonary edema: Secondary | ICD-10-CM | POA: Diagnosis not present

## 2024-06-30 NOTE — Telephone Encounter (Addendum)
 [facility] called and stated that (STARTING TODAY, 10/9) pt was going to start taking Doxy 100mg  BID and a 5 day course of Azythromycin. Instructed for pt to have INR drawn on Monday 10/13. Pt is to take 1 mg of warfarin on Friday (10/10), 2mg  of warfarin on Saturday (10/11) and on Sunday (10/12) pt is to take 2mg  of warfarin.   Pt's typical warfarin dose is 2mg  daily except for 3mg  on Sundays.

## 2024-07-04 ENCOUNTER — Ambulatory Visit: Payer: Self-pay | Admitting: *Deleted

## 2024-07-04 DIAGNOSIS — Z7901 Long term (current) use of anticoagulants: Secondary | ICD-10-CM | POA: Diagnosis not present

## 2024-07-04 DIAGNOSIS — Z5181 Encounter for therapeutic drug level monitoring: Secondary | ICD-10-CM

## 2024-07-04 DIAGNOSIS — Z952 Presence of prosthetic heart valve: Secondary | ICD-10-CM

## 2024-07-04 LAB — PROTIME-INR: INR: 3.28 — AB (ref 0.80–1.20)

## 2024-07-04 NOTE — Progress Notes (Signed)
 Description   INR-3.28; Spoke with [facility] and advised to have patient not take any warfarin today then take 2 tablets daily except 1 tablet on Friday and Sunday resume normal dose 3 tablets except 2 tablets daily and recheck INR on Monday. (finished zpak 07/04/24 and finishing doxycycline  on 07/10/24).  Normal dose: continue taking 2 tablets daily except 3 tablets on Sundays. Recheck INR in 1 week-finishing doxycycline . Patient at [facility]: Phone [phone]. Fax: [phone]. Call our office if you have any problems or concerns [phone]

## 2024-07-04 NOTE — Patient Instructions (Signed)
 Description   INR-3.28; Spoke with [facility] and advised to have patient not take any warfarin today then take 2 tablets daily except 1 tablet on Friday and Sunday resume normal dose 3 tablets except 2 tablets daily and recheck INR on Monday. (finished zpak 07/04/24 and finishing doxycycline  on 07/10/24).  Normal dose: continue taking 2 tablets daily except 3 tablets on Sundays. Recheck INR in 1 week-finishing doxycycline . Patient at [facility]: Phone [phone]. Fax: [phone]. Call our office if you have any problems or concerns [phone]

## 2024-07-08 DIAGNOSIS — F0390 Unspecified dementia without behavioral disturbance: Secondary | ICD-10-CM | POA: Diagnosis not present

## 2024-07-08 DIAGNOSIS — F411 Generalized anxiety disorder: Secondary | ICD-10-CM | POA: Diagnosis not present

## 2024-07-08 DIAGNOSIS — I5030 Unspecified diastolic (congestive) heart failure: Secondary | ICD-10-CM | POA: Diagnosis not present

## 2024-07-08 DIAGNOSIS — E1140 Type 2 diabetes mellitus with diabetic neuropathy, unspecified: Secondary | ICD-10-CM | POA: Diagnosis not present

## 2024-07-11 ENCOUNTER — Ambulatory Visit (INDEPENDENT_AMBULATORY_CARE_PROVIDER_SITE_OTHER): Payer: Self-pay | Admitting: Cardiovascular Disease

## 2024-07-11 DIAGNOSIS — Z7901 Long term (current) use of anticoagulants: Secondary | ICD-10-CM | POA: Diagnosis not present

## 2024-07-11 DIAGNOSIS — Z5181 Encounter for therapeutic drug level monitoring: Secondary | ICD-10-CM

## 2024-07-11 LAB — PROTIME-INR: INR: 5.27 — AB (ref 0.80–1.20)

## 2024-07-11 NOTE — Progress Notes (Signed)
 INR 5.27 Please see anticoagulation encounter Spoke with [facility] and advised to have patient not take any warfarin today, Tuesday and Wednesday then continue taking 2 tablets daily except 3 tablets on Sundays. Recheck INR in 1 week Patient at [facility]: Phone [phone]. Fax: [phone]. Call our office if you have any problems or concerns [phone]

## 2024-07-18 ENCOUNTER — Telehealth: Payer: Self-pay | Admitting: *Deleted

## 2024-07-18 DIAGNOSIS — Z7901 Long term (current) use of anticoagulants: Secondary | ICD-10-CM | POA: Diagnosis not present

## 2024-07-18 LAB — PROTIME-INR: INR: 1.79 — AB (ref 0.80–1.20)

## 2024-07-18 NOTE — Telephone Encounter (Signed)
 Spoke with [facility] and was advised that the INR results are not back at this time.

## 2024-07-18 NOTE — Telephone Encounter (Signed)
 Spoke with [facility]. INR still has not come back. Advised patient can have usual warfarin dose tonight. [facility] will call tomorrow morning with INR results.

## 2024-07-19 ENCOUNTER — Ambulatory Visit (INDEPENDENT_AMBULATORY_CARE_PROVIDER_SITE_OTHER): Admitting: *Deleted

## 2024-07-19 DIAGNOSIS — Z5181 Encounter for therapeutic drug level monitoring: Secondary | ICD-10-CM

## 2024-07-19 DIAGNOSIS — Z7901 Long term (current) use of anticoagulants: Secondary | ICD-10-CM

## 2024-07-19 NOTE — Progress Notes (Signed)
 Description   INR-1.79; Spoke with Illene at [employer] [employer] and advised to have patient take 2.5 tablets of warfarin today then continue taking 2 tablets daily except 3 tablets on Sundays. Recheck INR in 1 week.  Patient at [facility]: Phone [phone]. Fax: [phone]. Call our office if you have any problems or concerns [phone]

## 2024-07-19 NOTE — Patient Instructions (Addendum)
 Description   INR-1.79; Spoke with Illene at [facility] and advised to have patient take 2.5 tablets of warfarin today then continue taking 2 tablets daily except 3 tablets on Sundays. Recheck INR in 1 week.  Patient at [facility]: Phone [phone]. Fax: [phone]. Call our office if you have any problems or concerns [phone]

## 2024-07-25 ENCOUNTER — Ambulatory Visit: Payer: Self-pay | Admitting: Cardiovascular Disease

## 2024-07-25 DIAGNOSIS — Z7901 Long term (current) use of anticoagulants: Secondary | ICD-10-CM

## 2024-07-25 DIAGNOSIS — F411 Generalized anxiety disorder: Secondary | ICD-10-CM | POA: Diagnosis not present

## 2024-07-25 DIAGNOSIS — F03A4 Unspecified dementia, mild, with anxiety: Secondary | ICD-10-CM | POA: Diagnosis not present

## 2024-07-25 DIAGNOSIS — F5101 Primary insomnia: Secondary | ICD-10-CM | POA: Diagnosis not present

## 2024-07-25 DIAGNOSIS — Z79899 Other long term (current) drug therapy: Secondary | ICD-10-CM | POA: Diagnosis not present

## 2024-07-25 DIAGNOSIS — Z5181 Encounter for therapeutic drug level monitoring: Secondary | ICD-10-CM

## 2024-07-25 LAB — PROTIME-INR: INR: 1.66 — AB (ref 0.80–1.20)

## 2024-07-25 NOTE — Progress Notes (Signed)
 INR 1.66 Please see anticoagulation encounter Spoke with Illene at [facility] and advised to have patient take 3 tablets of warfarin today then continue taking 2 tablets daily except 3 tablets on Sundays. Recheck INR in 1 week.  Patient at [facility]: Phone [phone]. Fax: [phone]. Call our office if you have any problems or concerns [phone]

## 2024-07-27 NOTE — Progress Notes (Unsigned)
 Cardiology Office Note:    Date:  07/29/2024   ID:  [name], DOB 1933-03-18, MRN [id number]  PCP:  Auston Opal, DO  Cardiologist:  Lonni LITTIE Nanas, MD  Electrophysiologist:  None   Referring MD: No ref. provider found   Chief Complaint  Patient presents with   Coronary Artery Disease    History of Present Illness:    [name] is a 89 y.o. female with a hx of CAD status post CABG with subsequent PCI to RCA with BMS, mechanical AVR, hypertension, hyperlipidemia, diabetes, GI bleeding, CKD, paroxysmal atrial fibrillation, chronic diastolic heart failure.  She previously followed with Dr. Dann, last seen 04/2023.  She underwent CABG with mechanical AVR in [place] in the 90s.  She was evaluated in 03/2015 for anginal symptoms.  Cath showed severe native vessel disease, patent LIMA-LAD and SVG-diagonal, occluded SVG-OM/PDA (second half of OM/PDA graft was open), 75% ostial RCA stenosis treated with DES.  She was admitted with sepsis due to E. coli bacteremia from UTI 11/2018.  TTE suggested possible mitral valve vegetation, TEE was negative for vegetations.  She underwent DCCV 07/2019 for A-fib.  She was admitted with GI bleeding 10/2019.  She was admitted again with GI bleeding 09/02/2023.  Hemoglobin 6.3 on presentation, improved with transfusion was 9.3 on discharge.  Enteroscopy showed gastric AVMs but no high risk stigmata for bleeding, family declined colonoscopy or repeat capsule endoscopy.  She was restarted on Coumadin .  Also volume overloaded during admission, she was diuresed with IV Lasix .  Echocardiogram 08/27/2023 showed EF 55 to 60%, low normal RV function, mild left atrial enlargement, moderate right atrial enlargement, severe tricuspid regurgitation, mechanical aortic valve with mean gradient 8 mmHg.  Zio patch x 14 days 09/2023 showed 100% A-fib burden, average rate 86 bpm.  Since last clinic visit, she reports she is doing well.  Had pneumonia few weeks ago but  has resolved. Denies any chest pain, dyspnea, lightheadedness, syncope, or palpitations.  Continues to have some lower extremity edema but stable.  She does physical therapy 4 days/week and will walk with them, otherwise is in wheelchair.   Wt Readings from Last 3 Encounters:  01/22/24 130 lb (59 kg)  10/02/23 151 lb (68.5 kg)  09/02/23 154 lb 12.2 oz (70.2 kg)     Past Medical History:  Diagnosis Date   Anemia    Anginal pain    Arteriovenous malformation of gastrointestinal tract    Arthritis    fingers mostly (04/12/2015)   Carotid artery disease    Carotid US  3/17:  60-79% RICA; 40-59% LICA; Elevated bilateral subclavian artery velocities >>f/u 1 year. // Carotid US  4/18: R 40-59; L 1-39 >> FU 1 year // Carotid US  10/2018: R 40-59; L 1-39, L subclavian stenosis    CHF (congestive heart failure) (HCC)    Chronic kidney disease (CKD), stage III (moderate) (HCC)    Chronic lower GI bleeding    today; last time was ~ 8 yr ago; used to have them often before that too (02/11/2013)   Coronary artery disease    DJD (degenerative joint disease)    Dysrhythmia    Heart murmur    History of blood transfusion    a few times over the years; usually related to my Coumadin  (04/12/2015   History of gout    Hyperlipemia    Hypertension    Macula lutea degeneration    Old MI (myocardial infarction)    a coulple /dr in 02/2008; I  never even knew I'd had them (04/12/2015)   Type II diabetes mellitus (HCC)    no medications, diet controlled    Past Surgical History:  Procedure Laterality Date   AORTIC VALVE REPLACEMENT  09/23/1991   St. Jude   APPENDECTOMY  09/23/1951   BIOPSY  11/06/2019   Procedure: BIOPSY;  Surgeon: Legrand Victory LITTIE DOUGLAS, MD;  Location: [facility] ENDOSCOPY;  Service: Gastroenterology;;   CARDIAC CATHETERIZATION  ~1990   CARDIAC CATHETERIZATION  04/12/2015   Procedure: Coronary Stent Intervention;  Surgeon: Candyce GORMAN Reek, MD;    SYNERGY DES 3.5X16 to the ostial RCA     CARDIAC CATHETERIZATION  04/12/2015   Procedure: Coronary/Graft Angiography;  Surgeon: Candyce GORMAN Kotyk, MD; LAD & CFX 100%, patent LIMA-LAD, SVG-D1; SVG-OM-PDA first limb 100%, 2nd limb patent; oRCA 75%>0 w/ stent   CARDIAC VALVE REPLACEMENT  09/22/1992   St. Jude/notes 10/29/2003 (02/11/2013)   CARDIOVERSION N/A 08/15/2019   Procedure: CARDIOVERSION;  Surgeon: Alveta Aleene PARAS, MD;  Location: [facility] ENDOSCOPY;  Service: Cardiovascular;  Laterality: N/A;   COLONOSCOPY N/A 02/13/2013   Procedure: COLONOSCOPY;  Surgeon: Princella CHRISTELLA Nida, MD;  Location: [facility] ENDOSCOPY;  Service: Endoscopy;  Laterality: N/A;   CORONARY ANGIOPLASTY WITH STENT PLACEMENT  2009+   3 at least; put in 1 stent at a time (02/11/2013)   CORONARY ARTERY BYPASS GRAFT  09/22/1988   LIMA-LAD, SVG-OM-PDA, SVG-D1   DILATION AND CURETTAGE OF UTERUS  05/24/1959   'after a miscarriage (02/11/2013)   ENTEROSCOPY N/A 02/13/2013   Procedure: ENTEROSCOPY;  Surgeon: Princella CHRISTELLA Nida, MD;  Location: [facility] ENDOSCOPY;  Service: Endoscopy;  Laterality: N/A;   ENTEROSCOPY N/A 11/06/2019   Procedure: ENTEROSCOPY;  Surgeon: Legrand Victory LITTIE DOUGLAS, MD;  Location: [facility] ENDOSCOPY;  Service: Gastroenterology;  Laterality: N/A;   ENTEROSCOPY N/A 08/26/2023   Procedure: ENTEROSCOPY;  Surgeon: Leigh Elspeth SQUIBB, MD;  Location: [facility] ENDOSCOPY;  Service: Gastroenterology;  Laterality: N/A;   ESOPHAGOGASTRODUODENOSCOPY (EGD) WITH PROPOFOL  N/A 12/01/2019   Procedure: ESOPHAGOGASTRODUODENOSCOPY (EGD) WITH PROPOFOL ;  Surgeon: Shila Gustav GAILS, MD;  Location: [facility] ENDOSCOPY;  Service: Endoscopy;  Laterality: N/A;   EXPLORATORY LAPAROTOMY  02/24/2008   which revealed a retroperitoneal hematoma and bleeding from the right external iliac artery/notes 03/02/2008 (02/11/2013)    FLEXIBLE SIGMOIDOSCOPY N/A 04/21/2015   Procedure: FLEXIBLE SIGMOIDOSCOPY;  Surgeon: Gwendlyn ONEIDA Buddy, MD;  Location: [facility] ENDOSCOPY;  Service: Endoscopy;  Laterality: N/A;   HOT HEMOSTASIS N/A 11/06/2019    Procedure: HOT HEMOSTASIS (ARGON PLASMA COAGULATION/BICAP);  Surgeon: Legrand Victory LITTIE DOUGLAS, MD;  Location: [facility] ENDOSCOPY;  Service: Gastroenterology;  Laterality: N/A;   HOT HEMOSTASIS N/A 08/26/2023   Procedure: HOT HEMOSTASIS (ARGON PLASMA COAGULATION/BICAP);  Surgeon: Leigh Elspeth SQUIBB, MD;  Location: [facility] ENDOSCOPY;  Service: Gastroenterology;  Laterality: N/A;   LUMBAR LAMINECTOMY/DECOMPRESSION MICRODISCECTOMY Left 06/02/2022   Procedure: LEFT LUMBAR FOUR-FIVE MICRODISCECTOMY;  Surgeon: Mavis Purchase, MD;  Location: [facility] OR;  Service: Neurosurgery;  Laterality: Left;  3C   TEE WITHOUT CARDIOVERSION N/A 12/01/2018   Procedure: TRANSESOPHAGEAL ECHOCARDIOGRAM (TEE);  Surgeon: Jeffrie Oneil BROCKS, MD;  Location: [facility] ENDOSCOPY;  Service: Cardiovascular;  Laterality: N/A;    Current Medications: Current Meds  Medication Sig   atorvastatin  (LIPITOR ) 40 MG tablet Take 1 tablet (40 mg total) by mouth daily. (Patient taking differently: Take 40 mg by mouth at bedtime.)   Bevacizumab  (AVASTIN  IV) Inject 1 Dose into the vein every 8 (eight) weeks.   clonazePAM  (KLONOPIN ) 0.5 MG tablet Take 1 tablet (0.5 mg total) by mouth  2 (two) times daily as needed for anxiety.   Emollient (BAG BALM) OINT Apply 1 Application topically daily. Apply to bilateral lower extremities for dry, scaly skin   ezetimibe  (ZETIA ) 10 MG tablet Take 1 tablet by mouth once daily (Patient taking differently: Take 10 mg by mouth at bedtime.)   FeFum-FePo-FA-B Cmp-C-Zn-Mn-Cu (SE-TAN PLUS ) 162-115.2-1 MG CAPS Take 1 capsule by mouth in the morning and at bedtime.   folic acid  (FOLVITE ) 400 MCG tablet Take 400 mcg by mouth in the morning.   guaiFENesin  (MUCINEX ) 600 MG 12 hr tablet Take 1 tablet (600 mg total) by mouth 2 (two) times daily.   isosorbide  mononitrate (IMDUR ) 30 MG 24 hr tablet Take 1 tablet (30 mg total) by mouth daily.   levothyroxine  (SYNTHROID ) 88 MCG tablet Take 88 mcg by mouth at bedtime.   memantine (NAMENDA) 5 MG tablet  Take 5 mg by mouth 2 (two) times daily.   metoprolol  tartrate (LOPRESSOR ) 25 MG tablet Take 0.5 tablets (12.5 mg total) by mouth 2 (two) times daily.   Multiple Vitamin (MULTIVITAMIN WITH MINERALS) TABS Take 1 tablet by mouth daily with breakfast.   Multiple Vitamins-Minerals (PRESERVISION AREDS 2 PO) Take 1 capsule by mouth 2 (two) times daily.   nitroGLYCERIN  (NITROSTAT ) 0.4 MG SL tablet DISSOLVE ONE TABLET UNDER THE TONGUE EVERY 5 MINUTES AS NEEDED FOR CHEST PAIN.  DO NOT EXCEED A TOTAL OF 3 DOSES IN 15 MINUTES (Patient taking differently: Place 0.4 mg under the tongue every 5 (five) minutes x 3 doses as needed for chest pain.)   Nutritional Supplements (BOOST GLUCOSE CONTROL PO) Take 1 Bottle by mouth daily.   Omega-3 Fatty Acids (FISH OIL PO) Take 700 mg by mouth 2 (two) times daily.   oxyCODONE  (OXY IR/ROXICODONE ) 5 MG immediate release tablet Take 1 tablet (5 mg total) by mouth every 6 (six) hours as needed for moderate pain (pain score 4-6) or severe pain (pain score 7-10).   pantoprazole  (PROTONIX ) 40 MG tablet Take 1 tablet (40 mg total) by mouth 2 (two) times daily.   polyethylene glycol (MIRALAX  / GLYCOLAX ) 17 g packet Take 17 g by mouth daily as needed for mild constipation.   potassium chloride  (MICRO-K ) 10 MEQ CR capsule Take 20 mEq by mouth daily.   risperiDONE (RISPERDAL) 0.25 MG tablet Take 0.25 mg by mouth 2 (two) times daily.   sertraline (ZOLOFT) 25 MG tablet Take 25 mg by mouth daily.   torsemide (DEMADEX) 20 MG tablet Take 20 mg by mouth 2 (two) times daily.   TYLENOL  500 MG tablet Take 500-1,000 mg by mouth every 6 (six) hours as needed for mild pain or headache.   warfarin (COUMADIN ) 1 MG tablet Take 0.5 tablets (0.5 mg total) by mouth daily at 4 PM. Give 0.5mg  tablet daily   [DISCONTINUED] isosorbide  mononitrate (IMDUR ) 60 MG 24 hr tablet Take 1 tablet by mouth once daily (Patient taking differently: 60 mg daily.)     Allergies:   Capzasin [capsaicin], Darvon  [propoxyphene], Lisinopril, Septra [sulfamethoxazole-trimethoprim], Voltaren [diclofenac], Warfarin and related, Tobrex [tobramycin], Trazodone  and nefazodone, Zolpidem  tartrate, and Penicillins   Social History   Socioeconomic History   Marital status: Married    Spouse name: Not on file   Number of children: 2   Years of education: Not on file   Highest education level: Not on file  Occupational History   Not on file  Tobacco Use   Smoking status: Never   Smokeless tobacco: Never  Vaping Use  Vaping status: Never Used  Substance and Sexual Activity   Alcohol use: No   Drug use: No   Sexual activity: Yes  Other Topics Concern   Not on file  Social History Narrative   Not on file   Social Drivers of Health   Financial Resource Strain: Not on file  Food Insecurity: No Food Insecurity (08/26/2023)   Hunger Vital Sign    Worried About Running Out of Food in the Last Year: Never true    Ran Out of Food in the Last Year: Never true  Transportation Needs: No Transportation Needs (08/26/2023)   PRAPARE - Administrator, Civil Service (Medical): No    Lack of Transportation (Non-Medical): No  Physical Activity: Not on file  Stress: Not on file  Social Connections: Not on file     Family History: The patient's family history includes Heart disease in her father and mother.  ROS:   Please see the history of present illness.     All other systems reviewed and are negative.  EKGs/Labs/Other Studies Reviewed:    The following studies were reviewed today:   EKG:   10/02/23: Atrial fibrillation, rate 94, LVH 01/22/24: Afib, rate 69, LBBB  Recent Labs: 08/26/2023: B Natriuretic Peptide 581.0; TSH 2.352 10/02/2023: ALT 100 01/22/2024: Magnesium 2.0 04/20/2024: BUN 36; Creatinine, Ser 1.64; Hemoglobin 11.4; Platelets 203; Potassium 3.3; Sodium 140  Recent Lipid Panel    Component Value Date/Time   CHOL 117 06/29/2019 0953   CHOL 172 05/01/2015 1234   TRIG 153 (H)  06/29/2019 0953   TRIG 248 (H) 05/01/2015 1234   HDL 51 06/29/2019 0953   HDL 46 05/01/2015 1234   CHOLHDL 2.3 06/29/2019 0953   LDLCALC 40 06/29/2019 0953   LDLCALC 76 05/01/2015 1234    Physical Exam:    VS:  BP 97/65 (BP Location: Left Arm, Patient Position: Sitting, Cuff Size: Large)   Pulse 76   Ht 5' (1.524 m)   SpO2 96%   BMI 25.39 kg/m     Wt Readings from Last 3 Encounters:  01/22/24 130 lb (59 kg)  10/02/23 151 lb (68.5 kg)  09/02/23 154 lb 12.2 oz (70.2 kg)     GEN:  in no acute distress HEENT: Normal NECK: No JVD CARDIAC: Irregular, normal rate, mechanical click no murmurs RESPIRATORY:  Clear to auscultation without rales, wheezing or rhonchi  ABDOMEN: Soft, non-tender, non-distended MUSCULOSKELETAL: trace edema SKIN: Warm and dry NEUROLOGIC: Oriented to person only PSYCHIATRIC:  Normal affect   ASSESSMENT:    1. Coronary artery disease involving native coronary artery of native heart without angina pectoris   2. Permanent atrial fibrillation (HCC)   3. H/O mechanical aortic valve replacement   4. Tricuspid valve insufficiency, unspecified etiology   5. Chronic diastolic heart failure (HCC)   6. Medication management      PLAN:    CAD:  She underwent CABG with mechanical AVR in [place] in the 90s.  She was evaluated in 03/2015 for anginal symptoms.  Cath showed severe native vessel disease, patent LIMA-LAD and SVG-diagonal, occluded SVG-OM/PDA (second half of OM/PDA graft was open), 75% ostial RCA stenosis treated with DES. -Continue Coumadin , statin -Continue Lopressor .  Continue Imdur , will reduce to 30 mg daily given soft BP  Atrial fibrillation: Status post cardioversion 07/2019.  Currently in rate controlled A-fib.  Suspect likely permanent atrial fibrillation at this point.  Zio patch x 14 days 09/2023 showed 100% A-fib burden, average rate 86 bpm. -  Continue lopressor  12.5 mg twice daily -Continue Coumadin , follows in Coumadin  clinic  Status  post mechanical AVR: On Coumadin .  Has been complicated by GI bleeding, most recently 08/2023.  Echocardiogram 08/27/2023 showed EF 55 to 60%, low normal RV function, mild left atrial enlargement, moderate right atrial enlargement, severe tricuspid regurgitation, mechanical aortic valve with mean gradient 8 mmHg.  Chronic diastolic heart failure: Currently on torsemide 20 mg twice daily.  Appears euvolemic.  Check BMET, mag  Tricuspid regurgitation: Severe on echo 08/2023.  Low normal RV function.  Appears euvolemic on torsemide.  Will update echocardiogram  Hypertension: On Imdur .  Soft BP, will reduce Imdur  to 30 mg daily  Hyperlipidemia: On atorvastatin  40 mg daily and Zetia  10 mg daily  CKD stage IIIb: Most recent creatinine 1.62 on 06/29/2024.  Check BMET, mag  RTC in 6 months   Medication Adjustments/Labs and Tests Ordered: Current medicines are reviewed at length with the patient today.  Concerns regarding medicines are outlined above.  Orders Placed This Encounter  Procedures   Magnesium   Basic Metabolic Panel (BMET)   ECHOCARDIOGRAM COMPLETE   Meds ordered this encounter  Medications   isosorbide  mononitrate (IMDUR ) 30 MG 24 hr tablet    Sig: Take 1 tablet (30 mg total) by mouth daily.    Dispense:  90 tablet    Refill:  3    Patient Instructions  Medication Instructions:  Your physician has recommended you make the following change in your medication:  DECREASE: Isosorbide  Mononitrate (Imdur ) 30 mg once daily  *If you need a refill on your cardiac medications before your next appointment, please call your pharmacy*  Lab Work: BMET, Mag If you have labs (blood work) drawn today and your tests are completely normal, you will receive your results only by: MyChart Message (if you have MyChart) OR A paper copy in the mail If you have any lab test that is abnormal or we need to change your treatment, we will call you to review the results.  Testing/Procedures: Your  physician has requested that you have an echocardiogram. Echocardiography is a painless test that uses sound waves to create images of your heart. It provides your doctor with information about the size and shape of your heart and how well your heart's chambers and valves are working. This procedure takes approximately one hour. There are no restrictions for this procedure. Please do NOT wear cologne, perfume, aftershave, or lotions (deodorant is allowed). Please arrive 15 minutes prior to your appointment time.  Please note: We ask at that you not bring children with you during ultrasound (echo/ vascular) testing. Due to room size and safety concerns, children are not allowed in the ultrasound rooms during exams. Our front office staff cannot provide observation of children in our lobby area while testing is being conducted. An adult accompanying a patient to their appointment will only be allowed in the ultrasound room at the discretion of the ultrasound technician under special circumstances. We apologize for any inconvenience.   Follow-Up: At [facility], you and your health needs are our priority.  As part of our continuing mission to provide you with exceptional heart care, our providers are all part of one team.  This team includes your primary Cardiologist (physician) and Advanced Practice Providers or APPs (Physician Assistants and Nurse Practitioners) who all work together to provide you with the care you need, when you need it.  Your next appointment:   6 month(s)  Provider:   Lonni CROME  Kate, MD      Signed, Lonni LITTIE Kate, MD  07/29/2024 1:40 PM    [facility]

## 2024-07-29 ENCOUNTER — Encounter: Payer: Self-pay | Admitting: Cardiology

## 2024-07-29 ENCOUNTER — Ambulatory Visit: Attending: Cardiology | Admitting: Cardiology

## 2024-07-29 VITALS — BP 97/65 | HR 76 | Ht 60.0 in

## 2024-07-29 DIAGNOSIS — I2510 Atherosclerotic heart disease of native coronary artery without angina pectoris: Secondary | ICD-10-CM | POA: Diagnosis not present

## 2024-07-29 DIAGNOSIS — Z952 Presence of prosthetic heart valve: Secondary | ICD-10-CM | POA: Insufficient documentation

## 2024-07-29 DIAGNOSIS — I4821 Permanent atrial fibrillation: Secondary | ICD-10-CM | POA: Insufficient documentation

## 2024-07-29 DIAGNOSIS — I5032 Chronic diastolic (congestive) heart failure: Secondary | ICD-10-CM | POA: Diagnosis not present

## 2024-07-29 DIAGNOSIS — I071 Rheumatic tricuspid insufficiency: Secondary | ICD-10-CM | POA: Insufficient documentation

## 2024-07-29 DIAGNOSIS — Z79899 Other long term (current) drug therapy: Secondary | ICD-10-CM | POA: Insufficient documentation

## 2024-07-29 MED ORDER — ISOSORBIDE MONONITRATE ER 30 MG PO TB24: 30.0000 mg | ORAL_TABLET | Freq: Every day | ORAL | 3 refills | Status: AC

## 2024-07-29 NOTE — Patient Instructions (Addendum)
 Medication Instructions:  Your physician has recommended you make the following change in your medication:  DECREASE: Isosorbide  Mononitrate (Imdur ) 30 mg once daily  *If you need a refill on your cardiac medications before your next appointment, please call your pharmacy*  Lab Work: BMET, Mag If you have labs (blood work) drawn today and your tests are completely normal, you will receive your results only by: MyChart Message (if you have MyChart) OR A paper copy in the mail If you have any lab test that is abnormal or we need to change your treatment, we will call you to review the results.  Testing/Procedures: Your physician has requested that you have an echocardiogram. Echocardiography is a painless test that uses sound waves to create images of your heart. It provides your doctor with information about the size and shape of your heart and how well your heart's chambers and valves are working. This procedure takes approximately one hour. There are no restrictions for this procedure. Please do NOT wear cologne, perfume, aftershave, or lotions (deodorant is allowed). Please arrive 15 minutes prior to your appointment time.  Please note: We ask at that you not bring children with you during ultrasound (echo/ vascular) testing. Due to room size and safety concerns, children are not allowed in the ultrasound rooms during exams. Our front office staff cannot provide observation of children in our lobby area while testing is being conducted. An adult accompanying a patient to their appointment will only be allowed in the ultrasound room at the discretion of the ultrasound technician under special circumstances. We apologize for any inconvenience.   Follow-Up: At [facility], you and your health needs are our priority.  As part of our continuing mission to provide you with exceptional heart care, our providers are all part of one team.  This team includes your primary Cardiologist  (physician) and Advanced Practice Providers or APPs (Physician Assistants and Nurse Practitioners) who all work together to provide you with the care you need, when you need it.  Your next appointment:   6 month(s)  Provider:   Lonni LITTIE Nanas, MD

## 2024-07-30 LAB — BASIC METABOLIC PANEL WITH GFR
BUN/Creatinine Ratio: 16 (ref 12–28)
BUN: 28 mg/dL — ABNORMAL HIGH (ref 8–27)
CO2: 25 mmol/L (ref 20–29)
Calcium: 9.5 mg/dL (ref 8.7–10.3)
Chloride: 100 mmol/L (ref 96–106)
Creatinine, Ser: 1.79 mg/dL — ABNORMAL HIGH (ref 0.57–1.00)
Glucose: 105 mg/dL — ABNORMAL HIGH (ref 70–99)
Potassium: 4.5 mmol/L (ref 3.5–5.2)
Sodium: 142 mmol/L (ref 134–144)
eGFR: 27 mL/min/1.73 — ABNORMAL LOW (ref >59)

## 2024-07-30 LAB — MAGNESIUM: Magnesium: 2.2 mg/dL (ref 1.6–2.3)

## 2024-07-31 ENCOUNTER — Ambulatory Visit: Payer: Self-pay | Admitting: Cardiology

## 2024-08-01 ENCOUNTER — Telehealth: Payer: Self-pay | Admitting: *Deleted

## 2024-08-01 DIAGNOSIS — Z79899 Other long term (current) drug therapy: Secondary | ICD-10-CM | POA: Diagnosis not present

## 2024-08-01 DIAGNOSIS — Z7901 Long term (current) use of anticoagulants: Secondary | ICD-10-CM | POA: Diagnosis not present

## 2024-08-01 NOTE — Telephone Encounter (Signed)
 Called [facility] and had to leave a message for Illene to inquire if the patient had her labs drawn today and to call back with an update.

## 2024-08-02 ENCOUNTER — Ambulatory Visit (INDEPENDENT_AMBULATORY_CARE_PROVIDER_SITE_OTHER): Payer: Self-pay | Admitting: Pharmacist

## 2024-08-02 DIAGNOSIS — Z952 Presence of prosthetic heart valve: Secondary | ICD-10-CM

## 2024-08-02 DIAGNOSIS — Z7901 Long term (current) use of anticoagulants: Secondary | ICD-10-CM

## 2024-08-02 DIAGNOSIS — I480 Paroxysmal atrial fibrillation: Secondary | ICD-10-CM

## 2024-08-02 DIAGNOSIS — Z5181 Encounter for therapeutic drug level monitoring: Secondary | ICD-10-CM

## 2024-08-02 LAB — POCT INR: INR: 2.4 (ref 2.0–3.0)

## 2024-08-02 NOTE — Patient Instructions (Addendum)
 Description   INR 2.4: Spoke with Illene at [facility] and advised continue taking 2 tablets daily except 3 tablets on Sundays. Recheck INR in 1 week.  Patient at [facility]: Phone [phone]. Fax: [phone]. Call our office if you have any problems or concerns [phone]

## 2024-08-02 NOTE — Progress Notes (Signed)
 Description   INR 2.4: Spoke with Illene at [facility] and advised continue taking 2 tablets daily except 3 tablets on Sundays. Recheck INR in 1 week.  Patient at [facility]: Phone [phone]. Fax: [phone]. Call our office if you have any problems or concerns [phone]

## 2024-08-08 ENCOUNTER — Ambulatory Visit (INDEPENDENT_AMBULATORY_CARE_PROVIDER_SITE_OTHER): Payer: Self-pay | Admitting: Cardiovascular Disease

## 2024-08-08 DIAGNOSIS — I480 Paroxysmal atrial fibrillation: Secondary | ICD-10-CM

## 2024-08-08 DIAGNOSIS — Z5181 Encounter for therapeutic drug level monitoring: Secondary | ICD-10-CM

## 2024-08-08 DIAGNOSIS — Z952 Presence of prosthetic heart valve: Secondary | ICD-10-CM

## 2024-08-08 DIAGNOSIS — Z7901 Long term (current) use of anticoagulants: Secondary | ICD-10-CM

## 2024-08-08 LAB — PROTIME-INR: INR: 1.69 — AB (ref 0.80–1.20)

## 2024-08-08 NOTE — Progress Notes (Signed)
 INR 1.69 Please see anticoagulation encounter Spoke with Illene at [facility] and advised take 2.5 tablets today only then Increase to 2 tablets daily except 3 tablets on Saturdays and Sundays. Recheck INR in 2 weeks.  Patient at [facility]: Phone [phone]. Fax: [phone]. Call our office if you have any problems or concerns [phone]

## 2024-08-11 DIAGNOSIS — H43813 Vitreous degeneration, bilateral: Secondary | ICD-10-CM | POA: Diagnosis not present

## 2024-08-11 DIAGNOSIS — H353112 Nonexudative age-related macular degeneration, right eye, intermediate dry stage: Secondary | ICD-10-CM | POA: Diagnosis not present

## 2024-08-11 DIAGNOSIS — H353221 Exudative age-related macular degeneration, left eye, with active choroidal neovascularization: Secondary | ICD-10-CM | POA: Diagnosis not present

## 2024-08-11 DIAGNOSIS — E113293 Type 2 diabetes mellitus with mild nonproliferative diabetic retinopathy without macular edema, bilateral: Secondary | ICD-10-CM | POA: Diagnosis not present

## 2024-08-11 DIAGNOSIS — H35371 Puckering of macula, right eye: Secondary | ICD-10-CM | POA: Diagnosis not present

## 2024-08-22 ENCOUNTER — Ambulatory Visit: Payer: Self-pay | Admitting: Pharmacist

## 2024-08-22 DIAGNOSIS — Z952 Presence of prosthetic heart valve: Secondary | ICD-10-CM

## 2024-08-22 DIAGNOSIS — Z7901 Long term (current) use of anticoagulants: Secondary | ICD-10-CM | POA: Diagnosis not present

## 2024-08-22 DIAGNOSIS — I480 Paroxysmal atrial fibrillation: Secondary | ICD-10-CM

## 2024-08-22 LAB — POCT INR: INR: 2.2 (ref 2.0–3.0)

## 2024-08-22 NOTE — Patient Instructions (Signed)
 Description   INR 2.2: Spoke with [facility]. Continue 2 tablets daily except 3 tablets on Saturdays and Sundays. Recheck INR in 1 week Patient at [facility]: Phone [phone]. Fax: [phone]. Call our office if you have any problems or concerns [phone]

## 2024-08-22 NOTE — Progress Notes (Signed)
 Description   INR 2.2: Spoke with [facility]. Continue 2 tablets daily except 3 tablets on Saturdays and Sundays. Recheck INR in 1 week Patient at [facility]: Phone [phone]. Fax: [phone]. Call our office if you have any problems or concerns [phone]

## 2024-08-24 DIAGNOSIS — Z952 Presence of prosthetic heart valve: Secondary | ICD-10-CM | POA: Diagnosis not present

## 2024-08-25 DIAGNOSIS — Z79899 Other long term (current) drug therapy: Secondary | ICD-10-CM | POA: Diagnosis not present

## 2024-08-25 DIAGNOSIS — R8290 Unspecified abnormal findings in urine: Secondary | ICD-10-CM | POA: Diagnosis not present

## 2024-08-29 ENCOUNTER — Ambulatory Visit: Payer: Self-pay | Admitting: *Deleted

## 2024-08-29 ENCOUNTER — Telehealth: Payer: Self-pay | Admitting: *Deleted

## 2024-08-29 DIAGNOSIS — Z7901 Long term (current) use of anticoagulants: Secondary | ICD-10-CM

## 2024-08-29 DIAGNOSIS — I480 Paroxysmal atrial fibrillation: Secondary | ICD-10-CM

## 2024-08-29 DIAGNOSIS — Z952 Presence of prosthetic heart valve: Secondary | ICD-10-CM

## 2024-08-29 LAB — PROTIME-INR: INR: 1.49 — AB (ref 0.80–1.20)

## 2024-08-29 NOTE — Telephone Encounter (Signed)
 Called [facility] to inquire in INR is done and Illene was busy and the secretary took the number to call back regarding the patient. Will await and follow up.

## 2024-08-29 NOTE — Progress Notes (Signed)
 Description   INR-1.49; Spoke with Illene at [facility] and advised to take 3 tablets (3mg ) of warfarin today (has 3 more days of cipro 250mg  bid) then continue 2 tablets daily except 3 tablets on Saturdays and Sundays. Recheck INR in 1 week Patient at [facility]: Phone [phone]. Fax: [phone]. Call our office if you have any problems or concerns [phone]

## 2024-08-29 NOTE — Patient Instructions (Signed)
 Description   INR-1.49; Spoke with [name] at [facility] and advised to take 3 tablets (3mg ) of warfarin today (has 3 more days of cipro 250mg  bid) then continue 2 tablets daily except 3 tablets on Saturdays and Sundays. Recheck INR in 1 week Patient at [facility]: Phone [phone]. Fax: [phone]. Call our office if you have any problems or concerns [phone]

## 2024-09-05 ENCOUNTER — Ambulatory Visit: Payer: Self-pay | Admitting: Cardiology

## 2024-09-05 DIAGNOSIS — Z79899 Other long term (current) drug therapy: Secondary | ICD-10-CM | POA: Diagnosis not present

## 2024-09-05 DIAGNOSIS — Z7901 Long term (current) use of anticoagulants: Secondary | ICD-10-CM

## 2024-09-05 DIAGNOSIS — I480 Paroxysmal atrial fibrillation: Secondary | ICD-10-CM

## 2024-09-05 DIAGNOSIS — Z952 Presence of prosthetic heart valve: Secondary | ICD-10-CM

## 2024-09-05 LAB — PROTIME-INR: INR: 1.79 — AB (ref 0.80–1.20)

## 2024-09-05 NOTE — Progress Notes (Signed)
 INR 1.79 Please see anticoagulation encounter Spoke with Illene at [facility] and advised to take 3 tablets today only then continue 2 tablets daily except 3 tablets on Saturdays and Sundays. Recheck INR in 2 weeks Patient at [facility]: Phone [phone]. Fax: [phone]. Call our office if you have any problems or concerns [phone]

## 2024-09-08 ENCOUNTER — Ambulatory Visit (HOSPITAL_COMMUNITY)
Admission: RE | Admit: 2024-09-08 | Discharge: 2024-09-08 | Attending: Cardiovascular Disease | Admitting: Cardiovascular Disease

## 2024-09-08 DIAGNOSIS — I071 Rheumatic tricuspid insufficiency: Secondary | ICD-10-CM

## 2024-09-08 LAB — ECHOCARDIOGRAM COMPLETE
AR max vel: 1.11 cm2
AV Area VTI: 1.18 cm2
AV Area mean vel: 1.07 cm2
AV Mean grad: 15 mmHg
AV Peak grad: 26.9 mmHg
Ao pk vel: 2.59 m/s
S' Lateral: 3 cm

## 2024-09-19 ENCOUNTER — Ambulatory Visit (INDEPENDENT_AMBULATORY_CARE_PROVIDER_SITE_OTHER): Admitting: Cardiovascular Disease

## 2024-09-19 DIAGNOSIS — Z5181 Encounter for therapeutic drug level monitoring: Secondary | ICD-10-CM

## 2024-09-19 LAB — PROTIME-INR: INR: 1.76 — AB (ref 0.80–1.20)

## 2024-09-19 NOTE — Progress Notes (Signed)
 Lab Results  Component Value Date   INR 1.76 (A) 09/19/2024   INR 1.79 (A) 09/05/2024   INR 1.49 (A) 08/29/2024   Description   INR 1.76; Spoke with Illene at [facility] and advised pt  to take 2.5mg  of warfarin today then START taking warfarin 2mg  daily except 3mg  on Sundays, Wednesday and Saturdays.  Recheck INR in 2 weeks Patient at [facility]: Phone [phone]. Fax: [phone]. Call our office if you have any problems or concerns [phone]

## 2024-09-19 NOTE — Patient Instructions (Addendum)
 Description   INR 1.76; Spoke with Illene at [facility] and advised pt  to take 2.5mg  of warfarin today then START taking warfarin 2mg  daily except 3mg  on Sundays, Wednesday and Saturdays.  Recheck INR in 2 weeks Patient at [facility]: Phone [phone]. Fax: [phone]. Call our office if you have any problems or concerns [phone]

## 2024-10-03 ENCOUNTER — Ambulatory Visit: Payer: Self-pay | Admitting: Pharmacist

## 2024-10-03 DIAGNOSIS — Z7901 Long term (current) use of anticoagulants: Secondary | ICD-10-CM

## 2024-10-03 DIAGNOSIS — I480 Paroxysmal atrial fibrillation: Secondary | ICD-10-CM

## 2024-10-03 DIAGNOSIS — Z952 Presence of prosthetic heart valve: Secondary | ICD-10-CM

## 2024-10-03 LAB — POCT INR: INR: 2.2 (ref 2.0–3.0)

## 2024-10-03 NOTE — Patient Instructions (Signed)
 Description   INR 2.2; Spoke with [facility] and advised taking warfarin 2mg  daily except 3mg  on Sundays and Saturdays. Reduced by 1mg  on Wednesday due to being on cipro Recheck INR in 1 weeks Patient at [facility]: Phone [phone]. Fax: [phone]. Call our office if you have any problems or concerns [phone]

## 2024-10-03 NOTE — Progress Notes (Signed)
 Description   INR 2.2; Spoke with [facility] and advised taking warfarin 2mg  daily except 3mg  on Sundays and Saturdays. Reduced by 1mg  on Wednesday due to being on cipro Recheck INR in 1 weeks Patient at [facility]: Phone [phone]. Fax: [phone]. Call our office if you have any problems or concerns [phone]

## 2024-10-10 ENCOUNTER — Ambulatory Visit: Payer: Self-pay | Admitting: Pharmacist

## 2024-10-10 DIAGNOSIS — Z952 Presence of prosthetic heart valve: Secondary | ICD-10-CM

## 2024-10-10 DIAGNOSIS — Z7901 Long term (current) use of anticoagulants: Secondary | ICD-10-CM

## 2024-10-10 DIAGNOSIS — I480 Paroxysmal atrial fibrillation: Secondary | ICD-10-CM

## 2024-10-10 LAB — POCT INR: INR: 1.5 — AB (ref 2.0–3.0)

## 2024-10-10 NOTE — Progress Notes (Signed)
 Description   INR 1.5; Spoke with [facility]. Take 3mg  today and then continue  2mg  daily except 3mg  on Sundays, Wednesdays and Saturdays.  Recheck INR in 1 weeks Patient at [facility]: Phone [phone]. Fax: [phone]. Call our office if you have any problems or concerns [phone]

## 2024-10-10 NOTE — Patient Instructions (Signed)
 Description   INR 1.5; Spoke with [facility]. Take 3mg  today and then continue  2mg  daily except 3mg  on Sundays, Wednesdays and Saturdays.  Recheck INR in 1 weeks Patient at [facility]: Phone [phone]. Fax: [phone]. Call our office if you have any problems or concerns [phone]

## 2024-10-18 ENCOUNTER — Telehealth: Payer: Self-pay | Admitting: *Deleted

## 2024-10-18 LAB — POCT INR: INR: 1.8 — AB (ref 2.0–3.0)

## 2024-10-18 NOTE — Telephone Encounter (Signed)
 Spoke with Clotilda at [facility] and she stated the INR was drawn this morning. Will await results.

## 2024-10-19 ENCOUNTER — Telehealth: Payer: Self-pay | Admitting: *Deleted

## 2024-10-19 ENCOUNTER — Ambulatory Visit: Payer: Self-pay | Admitting: Pharmacist

## 2024-10-19 DIAGNOSIS — I480 Paroxysmal atrial fibrillation: Secondary | ICD-10-CM

## 2024-10-19 DIAGNOSIS — Z7901 Long term (current) use of anticoagulants: Secondary | ICD-10-CM

## 2024-10-19 DIAGNOSIS — Z952 Presence of prosthetic heart valve: Secondary | ICD-10-CM

## 2024-10-19 NOTE — Progress Notes (Signed)
 Description   INR 1.8; Spoke with [facility]. Take 4mg  today and 3mg  tomorrow then continue  2mg  daily except 3mg  on Sundays, Wednesdays and Saturdays.  Recheck INR in 1 weeks Patient at [facility]: Phone [phone]. Fax: [phone]. Call our office if you have any problems or concerns [phone]

## 2024-10-19 NOTE — Patient Instructions (Addendum)
 Description   INR 1.8; Spoke with [facility]. Take 4mg  today and 3mg  tomorrow then continue  2mg  daily except 3mg  on Sundays, Wednesdays and Saturdays.  Recheck INR in 1 weeks Patient at [facility]: Phone [phone]. Fax: [phone]. Call our office if you have any problems or concerns [phone]

## 2024-10-19 NOTE — Telephone Encounter (Signed)
 Called to follow up regarding patient's INR which was due Monday. Left a message for Illene the supervisor to call back regarding this. Will await and follow up.

## 2024-10-25 ENCOUNTER — Ambulatory Visit: Payer: Self-pay | Admitting: *Deleted

## 2024-10-25 ENCOUNTER — Telehealth: Payer: Self-pay | Admitting: *Deleted

## 2024-10-25 DIAGNOSIS — I480 Paroxysmal atrial fibrillation: Secondary | ICD-10-CM

## 2024-10-25 DIAGNOSIS — Z7901 Long term (current) use of anticoagulants: Secondary | ICD-10-CM

## 2024-10-25 DIAGNOSIS — Z952 Presence of prosthetic heart valve: Secondary | ICD-10-CM

## 2024-10-25 LAB — PROTIME-INR: INR: 2.82 — AB (ref 0.80–1.20)

## 2024-10-25 NOTE — Telephone Encounter (Signed)
 Spoke with Mrs Vicci at [facility] and she stated that the patient had her labs drawn today but they have not resulted. They will call back or fax once they receive them.

## 2025-02-24 ENCOUNTER — Ambulatory Visit: Admitting: Cardiology
